# Patient Record
Sex: Male | Born: 1955 | Race: White | Hispanic: No | Marital: Single | Smoking: Former smoker
Health system: Southern US, Community
[De-identification: ages and names within clinical notes are randomized; demographics above are authoritative.]

## PROBLEM LIST (undated history)

## (undated) DIAGNOSIS — L03116 Cellulitis of left lower limb: Secondary | ICD-10-CM

## (undated) DIAGNOSIS — Z89519 Acquired absence of unspecified leg below knee: Secondary | ICD-10-CM

## (undated) DIAGNOSIS — K82A1 Gangrene of gallbladder in cholecystitis: Secondary | ICD-10-CM

## (undated) DIAGNOSIS — E785 Hyperlipidemia, unspecified: Secondary | ICD-10-CM

## (undated) DIAGNOSIS — Q2381 Bicuspid aortic valve: Secondary | ICD-10-CM

## (undated) DIAGNOSIS — L089 Local infection of the skin and subcutaneous tissue, unspecified: Secondary | ICD-10-CM

## (undated) DIAGNOSIS — A4101 Sepsis due to Methicillin susceptible Staphylococcus aureus: Secondary | ICD-10-CM

## (undated) DIAGNOSIS — L02415 Cutaneous abscess of right lower limb: Secondary | ICD-10-CM

## (undated) DIAGNOSIS — M86071 Acute hematogenous osteomyelitis, right ankle and foot: Secondary | ICD-10-CM

## (undated) DIAGNOSIS — R011 Cardiac murmur, unspecified: Secondary | ICD-10-CM

## (undated) DIAGNOSIS — I10 Essential (primary) hypertension: Secondary | ICD-10-CM

## (undated) DIAGNOSIS — Q231 Congenital insufficiency of aortic valve: Secondary | ICD-10-CM

## (undated) DIAGNOSIS — I739 Peripheral vascular disease, unspecified: Secondary | ICD-10-CM

## (undated) DIAGNOSIS — I219 Acute myocardial infarction, unspecified: Secondary | ICD-10-CM

## (undated) DIAGNOSIS — I330 Acute and subacute infective endocarditis: Secondary | ICD-10-CM

## (undated) DIAGNOSIS — I480 Paroxysmal atrial fibrillation: Secondary | ICD-10-CM

## (undated) DIAGNOSIS — I2510 Atherosclerotic heart disease of native coronary artery without angina pectoris: Secondary | ICD-10-CM

## (undated) DIAGNOSIS — K219 Gastro-esophageal reflux disease without esophagitis: Secondary | ICD-10-CM

## (undated) DIAGNOSIS — N189 Chronic kidney disease, unspecified: Secondary | ICD-10-CM

## (undated) DIAGNOSIS — E119 Type 2 diabetes mellitus without complications: Secondary | ICD-10-CM

## (undated) HISTORY — DX: Atherosclerotic heart disease of native coronary artery without angina pectoris: I25.10

## (undated) HISTORY — DX: Local infection of the skin and subcutaneous tissue, unspecified: L08.9

## (undated) HISTORY — DX: Cellulitis of left lower limb: L03.116

## (undated) HISTORY — DX: Chronic kidney disease, unspecified: N18.9

## (undated) HISTORY — DX: Congenital insufficiency of aortic valve: Q23.1

## (undated) HISTORY — DX: Acute myocardial infarction, unspecified: I21.9

## (undated) HISTORY — DX: Essential (primary) hypertension: I10

## (undated) HISTORY — PX: CARDIAC VALVE REPLACEMENT: SHX585

## (undated) HISTORY — DX: Acquired absence of unspecified leg below knee: Z89.519

## (undated) HISTORY — DX: Acute hematogenous osteomyelitis, right ankle and foot: M86.071

## (undated) HISTORY — DX: Gangrene of gallbladder in cholecystitis: K82.A1

## (undated) HISTORY — DX: Cutaneous abscess of right lower limb: L02.415

## (undated) HISTORY — DX: Bicuspid aortic valve: Q23.81

## (undated) HISTORY — DX: Peripheral vascular disease, unspecified: I73.9

## (undated) HISTORY — DX: Sepsis due to methicillin susceptible Staphylococcus aureus: A41.01

## (undated) HISTORY — PX: CATARACT EXTRACTION W/ INTRAOCULAR LENS  IMPLANT, BILATERAL: SHX1307

## (undated) HISTORY — DX: Hyperlipidemia, unspecified: E78.5

## (undated) HISTORY — PX: AMPUTATION TOE: SHX6595

## (undated) HISTORY — DX: Acute and subacute infective endocarditis: I33.0

---

## 2006-12-11 ENCOUNTER — Ambulatory Visit: Payer: Self-pay | Admitting: Internal Medicine

## 2006-12-11 ENCOUNTER — Encounter: Payer: Self-pay | Admitting: Internal Medicine

## 2008-03-20 ENCOUNTER — Emergency Department (HOSPITAL_COMMUNITY): Admission: EM | Admit: 2008-03-20 | Discharge: 2008-03-20 | Payer: Self-pay | Admitting: Emergency Medicine

## 2008-04-20 ENCOUNTER — Encounter: Payer: Self-pay | Admitting: Internal Medicine

## 2008-05-07 DIAGNOSIS — I219 Acute myocardial infarction, unspecified: Secondary | ICD-10-CM

## 2008-05-07 HISTORY — DX: Acute myocardial infarction, unspecified: I21.9

## 2008-05-07 HISTORY — PX: CARDIAC CATHETERIZATION: SHX172

## 2008-05-07 HISTORY — PX: CORONARY ARTERY BYPASS GRAFT: SHX141

## 2008-05-07 HISTORY — PX: AORTIC VALVE REPLACEMENT: SHX41

## 2008-05-18 ENCOUNTER — Encounter: Payer: Self-pay | Admitting: Internal Medicine

## 2008-06-07 ENCOUNTER — Encounter: Payer: Self-pay | Admitting: Internal Medicine

## 2008-07-05 ENCOUNTER — Encounter: Payer: Self-pay | Admitting: Internal Medicine

## 2008-08-05 ENCOUNTER — Encounter: Payer: Self-pay | Admitting: Internal Medicine

## 2008-08-19 ENCOUNTER — Ambulatory Visit: Payer: Self-pay | Admitting: Internal Medicine

## 2008-08-24 ENCOUNTER — Ambulatory Visit: Payer: Self-pay | Admitting: Internal Medicine

## 2008-09-03 ENCOUNTER — Ambulatory Visit: Payer: Self-pay | Admitting: Internal Medicine

## 2008-09-04 ENCOUNTER — Encounter: Payer: Self-pay | Admitting: Internal Medicine

## 2008-10-05 ENCOUNTER — Encounter: Payer: Self-pay | Admitting: Internal Medicine

## 2008-11-06 ENCOUNTER — Emergency Department (HOSPITAL_COMMUNITY): Admission: EM | Admit: 2008-11-06 | Discharge: 2008-11-06 | Payer: Self-pay | Admitting: Family Medicine

## 2008-11-06 ENCOUNTER — Inpatient Hospital Stay (HOSPITAL_COMMUNITY): Admission: EM | Admit: 2008-11-06 | Discharge: 2008-11-09 | Payer: Self-pay | Admitting: Emergency Medicine

## 2008-11-06 ENCOUNTER — Ambulatory Visit: Payer: Self-pay | Admitting: Internal Medicine

## 2008-12-08 ENCOUNTER — Inpatient Hospital Stay (HOSPITAL_COMMUNITY): Admission: EM | Admit: 2008-12-08 | Discharge: 2008-12-13 | Payer: Self-pay | Admitting: Emergency Medicine

## 2008-12-10 ENCOUNTER — Ambulatory Visit: Payer: Self-pay | Admitting: Internal Medicine

## 2009-02-04 DIAGNOSIS — I2510 Atherosclerotic heart disease of native coronary artery without angina pectoris: Secondary | ICD-10-CM

## 2009-02-04 HISTORY — DX: Atherosclerotic heart disease of native coronary artery without angina pectoris: I25.10

## 2009-02-28 ENCOUNTER — Inpatient Hospital Stay (HOSPITAL_COMMUNITY): Admission: EM | Admit: 2009-02-28 | Discharge: 2009-03-21 | Payer: Self-pay | Admitting: Emergency Medicine

## 2009-02-28 ENCOUNTER — Ambulatory Visit: Payer: Self-pay | Admitting: Cardiology

## 2009-03-01 ENCOUNTER — Encounter: Payer: Self-pay | Admitting: Cardiothoracic Surgery

## 2009-03-01 ENCOUNTER — Encounter: Payer: Self-pay | Admitting: Cardiology

## 2009-03-02 ENCOUNTER — Ambulatory Visit: Payer: Self-pay | Admitting: Cardiothoracic Surgery

## 2009-03-08 ENCOUNTER — Ambulatory Visit: Payer: Self-pay | Admitting: Dentistry

## 2009-03-08 ENCOUNTER — Encounter: Payer: Self-pay | Admitting: Cardiology

## 2009-03-14 ENCOUNTER — Encounter: Payer: Self-pay | Admitting: Cardiothoracic Surgery

## 2009-03-27 ENCOUNTER — Ambulatory Visit: Payer: Self-pay | Admitting: Internal Medicine

## 2009-03-27 ENCOUNTER — Encounter: Payer: Self-pay | Admitting: Cardiology

## 2009-03-27 ENCOUNTER — Inpatient Hospital Stay (HOSPITAL_COMMUNITY): Admission: EM | Admit: 2009-03-27 | Discharge: 2009-03-31 | Payer: Self-pay | Admitting: Emergency Medicine

## 2009-03-28 ENCOUNTER — Encounter: Payer: Self-pay | Admitting: Cardiology

## 2009-03-29 ENCOUNTER — Encounter: Payer: Self-pay | Admitting: Cardiology

## 2009-03-30 ENCOUNTER — Ambulatory Visit: Payer: Self-pay | Admitting: Cardiothoracic Surgery

## 2009-03-31 ENCOUNTER — Encounter: Payer: Self-pay | Admitting: Cardiology

## 2009-04-05 ENCOUNTER — Inpatient Hospital Stay (HOSPITAL_COMMUNITY): Admission: EM | Admit: 2009-04-05 | Discharge: 2009-05-10 | Payer: Self-pay | Admitting: Emergency Medicine

## 2009-04-05 ENCOUNTER — Ambulatory Visit: Payer: Self-pay | Admitting: Cardiology

## 2009-04-06 ENCOUNTER — Encounter: Payer: Self-pay | Admitting: Cardiology

## 2009-04-07 ENCOUNTER — Encounter: Payer: Self-pay | Admitting: Cardiology

## 2009-04-08 ENCOUNTER — Ambulatory Visit: Payer: Self-pay | Admitting: Infectious Diseases

## 2009-04-08 ENCOUNTER — Encounter: Payer: Self-pay | Admitting: Cardiology

## 2009-04-10 ENCOUNTER — Encounter: Payer: Self-pay | Admitting: Internal Medicine

## 2009-04-11 ENCOUNTER — Encounter: Payer: Self-pay | Admitting: Cardiology

## 2009-04-19 DIAGNOSIS — I739 Peripheral vascular disease, unspecified: Secondary | ICD-10-CM

## 2009-04-19 DIAGNOSIS — I10 Essential (primary) hypertension: Secondary | ICD-10-CM

## 2009-04-19 DIAGNOSIS — Z794 Long term (current) use of insulin: Secondary | ICD-10-CM | POA: Insufficient documentation

## 2009-04-19 DIAGNOSIS — M869 Osteomyelitis, unspecified: Secondary | ICD-10-CM | POA: Insufficient documentation

## 2009-04-19 DIAGNOSIS — E119 Type 2 diabetes mellitus without complications: Secondary | ICD-10-CM

## 2009-04-19 DIAGNOSIS — I2510 Atherosclerotic heart disease of native coronary artery without angina pectoris: Secondary | ICD-10-CM | POA: Insufficient documentation

## 2009-04-19 DIAGNOSIS — Q231 Congenital insufficiency of aortic valve: Secondary | ICD-10-CM

## 2009-04-19 DIAGNOSIS — E785 Hyperlipidemia, unspecified: Secondary | ICD-10-CM

## 2009-05-07 HISTORY — PX: AMPUTATION: SHX166

## 2009-05-09 ENCOUNTER — Encounter: Payer: Self-pay | Admitting: Cardiovascular Disease

## 2009-05-23 ENCOUNTER — Encounter: Admission: RE | Admit: 2009-05-23 | Discharge: 2009-05-23 | Payer: Self-pay | Admitting: Cardiothoracic Surgery

## 2009-05-23 ENCOUNTER — Encounter: Payer: Self-pay | Admitting: Cardiology

## 2009-05-23 ENCOUNTER — Ambulatory Visit: Payer: Self-pay | Admitting: Cardiothoracic Surgery

## 2009-05-25 ENCOUNTER — Encounter: Payer: Self-pay | Admitting: Physician Assistant

## 2009-05-25 ENCOUNTER — Ambulatory Visit: Payer: Self-pay | Admitting: Cardiology

## 2009-05-25 DIAGNOSIS — J90 Pleural effusion, not elsewhere classified: Secondary | ICD-10-CM | POA: Insufficient documentation

## 2009-05-25 DIAGNOSIS — S21109A Unspecified open wound of unspecified front wall of thorax without penetration into thoracic cavity, initial encounter: Secondary | ICD-10-CM | POA: Insufficient documentation

## 2009-05-25 DIAGNOSIS — I25810 Atherosclerosis of coronary artery bypass graft(s) without angina pectoris: Secondary | ICD-10-CM

## 2009-05-25 DIAGNOSIS — Z952 Presence of prosthetic heart valve: Secondary | ICD-10-CM | POA: Insufficient documentation

## 2009-05-31 ENCOUNTER — Telehealth: Payer: Self-pay | Admitting: Cardiology

## 2009-06-06 ENCOUNTER — Ambulatory Visit: Payer: Self-pay | Admitting: Cardiothoracic Surgery

## 2009-06-06 ENCOUNTER — Encounter: Admission: RE | Admit: 2009-06-06 | Discharge: 2009-06-06 | Payer: Self-pay | Admitting: Cardiothoracic Surgery

## 2009-06-15 ENCOUNTER — Ambulatory Visit: Payer: Self-pay | Admitting: Cardiothoracic Surgery

## 2009-06-15 ENCOUNTER — Encounter: Admission: RE | Admit: 2009-06-15 | Discharge: 2009-06-15 | Payer: Self-pay | Admitting: Cardiothoracic Surgery

## 2009-06-22 ENCOUNTER — Ambulatory Visit: Payer: Self-pay | Admitting: Cardiothoracic Surgery

## 2009-06-22 ENCOUNTER — Encounter: Admission: RE | Admit: 2009-06-22 | Discharge: 2009-06-22 | Payer: Self-pay | Admitting: Cardiothoracic Surgery

## 2009-06-29 ENCOUNTER — Ambulatory Visit (HOSPITAL_COMMUNITY): Admission: RE | Admit: 2009-06-29 | Discharge: 2009-06-29 | Payer: Self-pay | Admitting: Plastic Surgery

## 2009-07-04 ENCOUNTER — Ambulatory Visit: Payer: Self-pay | Admitting: Cardiology

## 2009-07-06 ENCOUNTER — Ambulatory Visit: Payer: Self-pay | Admitting: Cardiothoracic Surgery

## 2009-07-07 ENCOUNTER — Ambulatory Visit: Payer: Self-pay | Admitting: Cardiothoracic Surgery

## 2009-07-07 ENCOUNTER — Ambulatory Visit (HOSPITAL_COMMUNITY): Admission: RE | Admit: 2009-07-07 | Discharge: 2009-07-07 | Payer: Self-pay | Admitting: Cardiothoracic Surgery

## 2009-07-13 ENCOUNTER — Encounter: Admission: RE | Admit: 2009-07-13 | Discharge: 2009-07-13 | Payer: Self-pay | Admitting: Cardiothoracic Surgery

## 2009-07-13 ENCOUNTER — Ambulatory Visit: Payer: Self-pay | Admitting: Cardiothoracic Surgery

## 2009-07-15 ENCOUNTER — Encounter: Payer: Self-pay | Admitting: Cardiology

## 2009-08-03 ENCOUNTER — Ambulatory Visit: Payer: Self-pay | Admitting: Cardiothoracic Surgery

## 2009-08-03 ENCOUNTER — Encounter: Admission: RE | Admit: 2009-08-03 | Discharge: 2009-08-03 | Payer: Self-pay | Admitting: Cardiothoracic Surgery

## 2009-08-17 ENCOUNTER — Encounter: Payer: Self-pay | Admitting: Cardiology

## 2009-09-09 ENCOUNTER — Ambulatory Visit (HOSPITAL_BASED_OUTPATIENT_CLINIC_OR_DEPARTMENT_OTHER): Admission: RE | Admit: 2009-09-09 | Discharge: 2009-09-09 | Payer: Self-pay | Admitting: Podiatry

## 2009-09-16 ENCOUNTER — Encounter: Payer: Self-pay | Admitting: Cardiology

## 2009-10-05 ENCOUNTER — Encounter: Payer: Self-pay | Admitting: Cardiology

## 2009-10-11 ENCOUNTER — Ambulatory Visit: Payer: Self-pay | Admitting: Cardiology

## 2010-04-25 ENCOUNTER — Observation Stay (HOSPITAL_COMMUNITY)
Admission: RE | Admit: 2010-04-25 | Discharge: 2010-04-26 | Payer: Self-pay | Source: Home / Self Care | Attending: Orthopaedic Surgery | Admitting: Orthopaedic Surgery

## 2010-05-02 ENCOUNTER — Encounter (INDEPENDENT_AMBULATORY_CARE_PROVIDER_SITE_OTHER): Payer: Self-pay | Admitting: Orthopaedic Surgery

## 2010-05-02 ENCOUNTER — Observation Stay (HOSPITAL_COMMUNITY)
Admission: AD | Admit: 2010-05-02 | Discharge: 2010-05-03 | Payer: Self-pay | Source: Home / Self Care | Attending: Orthopaedic Surgery | Admitting: Orthopaedic Surgery

## 2010-05-28 ENCOUNTER — Encounter: Payer: Self-pay | Admitting: Cardiothoracic Surgery

## 2010-06-06 NOTE — Letter (Signed)
Summary: [facility]   [facility] By: Sallee Provencal 10/19/2009 16:28:20  _____________________________________________________________________  External Attachment:    Type:   Image     Comment:   External Document

## 2010-06-06 NOTE — Letter (Signed)
Summary: [facility]   [facility]   Imported By: Sallee Provencal 05/30/2009 11:25:07  _____________________________________________________________________  External Attachment:    Type:   Image     Comment:   External Document

## 2010-06-06 NOTE — Progress Notes (Signed)
Summary: PHARMACY NEED TO VERIFY MEDICATIONS  Phone Note From Pharmacy   Caller: Nelson [phone] Summary of Call: PHARMACY NEED TO VERIFY REFILLS. Initial call taken by: Delsa Sale,  May 31, 2009 9:50 AM  Follow-up for Phone Call        Spoke with pharmacy had ? about metoprolol and klor-con. Levora Angel, CNA  May 31, 2009 10:21 AM  Follow-up by: Levora Angel, CNA,  May 31, 2009 10:21 AM

## 2010-06-06 NOTE — Letter (Signed)
Summary: Return To Work  [facility] [place]  [address] [place]   [place], [place] [address]   Phone: [phone]  Fax: [phone]        07/04/2009    TO: Dorathy Daft IT MAY CONCERN     RE: [name] [id number]     The above named individual is under my medical care.  He may work but with the following restrictions:  No lefting more than 20 lbs, no climbing ladders and allow extra time for stairs.  If you have any further questions or need additional information, please call.       Sincerely,      Sim Boast, RN for  Dr Minus Breeding

## 2010-06-06 NOTE — Assessment & Plan Note (Signed)
Summary: per check out/sf   Visit Type:  Follow-up Primary Provider:  [name]  CC:  CAD.  History of Present Illness: The   patient returns for followup of his coronary disease. Since I last saw him he has had no new cardiovascular complaints. He continues to have work done on his left foot with diabetic joint disease. He seems to have recovered nicely from surgery. He denies any ongoing shortness of breath which was his predominant complaint prior. He has not had any chest pressure, neck or arm discomfort. He said no palpitations, presyncope or syncope. He says his hemoglobin A1c was 7.8. He said he had lipids drawn with an LDL less than 100 but I don't have these results. I do note that his weight has gone up 11 pounds since we started recording it.  Current Medications (verified): 1)  Lasix 40 Mg Tabs (Furosemide) .... Take One Twice Daily 2)  Lantus 100 Unit/ml Soln (Insulin Glargine) .... Take As Directed 3)  Metoprolol Tartrate 50 Mg Tabs (Metoprolol Tartrate) .... One Twice A Day 4)  Bayer Aspirin 325 Mg Tabs (Aspirin) .... Take One Daily 5)  Glipizide 5 Mg Xr24h-Tab (Glipizide) .... Take One Twice Daily 6)  Januvia 100 Mg Tabs (Sitagliptin Phosphate) .... Take One Daily 7)  Multivitamins  Caps (Multiple Vitamin) .... Take One Daily 8)  Nexium 40 Mg Pack (Esomeprazole Magnesium) .... Take One Daily 9)  Potassium Chloride 20 Meq Pack (Potassium Chloride) .... Take One Daily 10)  Simvastatin 20 Mg Tabs (Simvastatin) .... Take One Daily 11)  Tamsulosin Hcl 0.4 Mg Caps (Tamsulosin Hcl) .... Take One Daily 12)  Calcium 500 Mg Tabs (Calcium Carbonate) .... Take Two Daily 13)  Septra Ds 800-160 Mg Tabs (Sulfamethoxazole-Trimethoprim) .Marland Kitchen.. 1 By Mouth Two Times A Day  Allergies (verified): 1)  ! * Ace Inhibitors  Past History:  Past Medical History: Reviewed history from 07/04/2009 and no changes required.  Non-ST-segment-elevation myocardial infarction in October 2010.   Cardiac catheterization revealed severe triple-vessel coronary       artery disease.  He underwent coronary artery bypass grafting on       March 14, 2009. At that time, he had a LIMA graft to the LAD as       well as saphenous vein graft with to the diagonal and ramus       intermediate.  He had sequential saphenous vein graft to his RV       marginal branch as well as the posterior descending branch of the       right coronary artery.  Hyperlipidemia Hypertension PVD Diabetes mellitus  Past Surgical History: Reviewed history from 07/04/2009 and no changes required. CABG  Review of Systems       As stated in the HPI and negative for all other systems.   Vital Signs:  Patient profile:   55 year old male Height:      70 inches Weight:      221 pounds BMI:     31.82 Pulse rate:   94 / minute Resp:     16 per minute BP sitting:   136 / 70  (right arm)  Vitals Entered By: Levora Angel, CNA (October 11, 2009 9:17 AM)  Physical Exam  General:  Well developed, well nourished, in no acute distress. Head:  normocephalic and atraumatic Neck:  Neck supple, no JVD. No masses, thyromegaly or abnormal cervical nodes. Chest Wall:  Well-healed sternotomy scar and multiple other scars Lungs:  Clear bilaterally to auscultation and percussion. Abdomen:  Bowel sounds positive; abdomen soft and non-tender without masses, organomegaly, or hernias noted. No hepatosplenomegaly, obese Msk:  mild diffuse muscle wasting Neurologic:  Alert and oriented x 3. Skin:  Intact without lesions or rashes. Psych:  Normal affect.   Detailed Cardiovascular Exam  Neck    Carotids: Carotids full and equal bilaterally soft bruits.      Neck Veins: Normal, no JVD.    Heart    Inspection: no deformities or lifts noted.      Palpation: normal PMI with no thrills palpable.      Auscultation: regular rate and rhythm, S1, S2 ,systolic murmur radiating up the aortic outflow tract and early peaking, no  diastolic murmurs  Vascular    Abdominal Aorta: no palpable masses, pulsations, or audible bruits.      Femoral Pulses: normal femoral pulses bilaterally.      Pedal Pulses: absent right dorsalis pedis pulse, absent right posterior tibial pulse, absent left dorsalis pedis pulse, left foot in a soft cast, mild edema of the right foot, chronic venous stasis changes    Radial Pulses: normal radial pulses bilaterally.      Peripheral Circulation: no clubbing, cyanosis,   EKG  Procedure date:  10/11/2009  Findings:      sinus rhythm, rate 94, left axis deviation, left ventricular hypertrophy, poor anterior R-wave progression  Impression & Recommendations:  Problem # 1:  CAD, AUTOLOGOUS BYPASS GRAFT (ICD-414.02) He is doing well and will continue with risk reduction. I did discuss with him the need for weight loss and exercise  Problem # 2:  HYPERLIPIDEMIA (ICD-272.4) I would be happy to review his lipids. I would like his LDL be less than 70 and HDL greater than 40 and wouldn't pursue this for medical therapy, diet and exercise.  Problem # 3:  HYPERTENSION (ICD-401.9) His blood pressure is controlled. He will continue the meds as listed.  Other Orders: EKG w/ Interpretation (93000)  Patient Instructions: 1)  Your physician recommends that you schedule a follow-up appointment in: 12 months 2)  Your physician recommends that you continue on your current medications as directed. Please refer to the Current Medication list given to you today.

## 2010-06-06 NOTE — Letter (Signed)
Summary: [facility]   Imported By: Jamelle Haring 07/28/2009 13:28:18  _____________________________________________________________________  External Attachment:    Type:   Image     Comment:   External Document

## 2010-06-06 NOTE — Assessment & Plan Note (Signed)
Summary: eph   Visit Type:  Follow-up  CC:  no complaints.  History of Present Illness: This is a 55 year old male patient, who was hospitalized from April 05, 2009 until May 10, 2009. He had undergone CABG x5 and aortic valve replacement March 14, 2009. He was readmitted with recurrent bilateral pleural effusions, sternal wound infection with sternal debridement and bilateral pectoralis muscle flaps on April 20, 2009. He also underwent right thoracentesis December 2nd and 17th and left the thoracentesis December 1. He also has a history of osteomyelitis of his right foot first and fifth ray had an orthopedic consult. Blood cultures were positive for rare staph. Aureus. He was treated with antibiotics and eventually discharged home.  The patient has been followed by home health closely. He finished his antibiotics Monday. He saw the PA at Dr. Lucianne Lei Trigt's office Monday. They did decrease his pleural drain on the left to twice weekly, down from 3 times weekly. His Lasix had been increased shortly after he got out of a hospital. He saw Dr. Denese Killings yesterday, who cleaned his sternal wound and pulled a J-tube.  Overall the patient remains weak. He denies chest pain, palpitations, dyspnea, dyspnea on exertion, orthopnea, paroxysmal nocturnal dyspnea, dizziness, or presyncope. His pulse rate is a little high today, but he says that generally runs high even presurgery. He is asymptomatic with this. He is afebrile today.  Current Medications (verified): 1)  Nu-Iron 150 Mg Caps (Polysaccharide Iron Complex) .... Take One Daily 2)  Lasix 40 Mg Tabs (Furosemide) .... Take One Twice Daily 3)  Lantus 100 Unit/ml Soln (Insulin Glargine) .... Take As Directed 4)  Lopressor 50 Mg Tabs (Metoprolol Tartrate) .... Take 1/2 Tablet 3 Times Daily 5)  Bayer Aspirin 325 Mg Tabs (Aspirin) .... Take One Daily 6)  Folic Acid 1 Mg Tabs (Folic Acid) .... Take One Daily 7)  Glipizide 5 Mg Xr24h-Tab (Glipizide) ....  Take One Twice Daily 8)  Januvia 100 Mg Tabs (Sitagliptin Phosphate) .... Take One Daily 9)  Multivitamins  Caps (Multiple Vitamin) .... Take One Daily 10)  Nexium 40 Mg Pack (Esomeprazole Magnesium) .... Take One Daily 11)  Potassium Chloride 20 Meq Pack (Potassium Chloride) .... Take One Daily 12)  Simvastatin 20 Mg Tabs (Simvastatin) .... Take One Daily 13)  Tamsulosin Hcl 0.4 Mg Caps (Tamsulosin Hcl) .... Take One Daily 14)  Calcium 500 Mg Tabs (Calcium Carbonate) .... Take Two Daily  Allergies: 1)  ! * Ace Inhibitors  Past History:  Past Medical History: Last updated: 04/19/2009 CAD Hyperlipidemia Hypertension PVD  diabetes mellitus  Review of Systems       See history of present illness  Vital Signs:  Patient profile:   55 year old male Height:      70 inches Weight:      210 pounds BMI:     30.24 Temp:     97.9 degrees F Pulse rate:   104 / minute Pulse rhythm:   regular BP sitting:   122 / 84  (left arm)  Vitals Entered By: Talbert Nan, CMA (May 25, 2009 8:24 AM)  Physical Exam  General:  Pale, chronically ill looking, in no acute distress. Neck: No JVD, HJR, Bruit, or thyroid enlargement Lungs: Decreased breath sounds at the right base with a few crackles. Decreased breath sounds at the left base, but clearer than the right with scattered wheezes Cardiovascular:sternal wound is dressed. I did not remove it as it was clean by Dr. Denese Killings yesterday. RRR, PMI  not displaced, heart sounds normal, no murmurs, gallops, bruit, thrill, or heave. Abdomen: BS normal. Soft without organomegaly, masses, lesions or tenderness. Extremities: PICC line remains in. To be pulled today by home health. Lower extremities have bilateral brawny edema with scaling changes, right greater than left. Decreased distal pulses. SKin: Warm, brawny changes as described above in the lower extremities  Musculoskeletal: No deformities Neuro: no focal signs    EKG  Procedure date:   05/25/2009  Findings:      normal sinus rhythm at 96 beats per minute, normal EKG  Impression & Recommendations:  Problem # 1:  OPEN WOUND OF CHEST , COMPLICATED (123456.1) Patient had a very complicated hospitalization with methicillin susceptible staph, RES bacteremia, and wound culture of the sternal wound. He underwent sternal reexploration debridement, pulse lavaged, and VAC placement by Dr. Gorden Harms in December 7. He had back change and stromal debridement December 9 and then sternal debridement with bilateral pectoralis muscle flaps. December 15. He had his wound dressing changed yesterday by Dr. Denese Killings [place] tube peg. Antibiotics are finished. He remains afebrile. He seems to be recovering slowly from that setback.  Problem # 2:  PLEURAL EFFUSION (ICD-511.9) Patient continues to have some bilateral pleural effusions. His Pleurx catheter on the left has been decreased to drain twice weekly. He continues to have quite a bit of edema in his lower extremities. I will increase his Lasix to 80 mg in the morning 40 in the evening for 5 days, as well as adjust his potassium. Patient just had blood work drawn by home health earlier this week, so I will not repeat this.  Problem # 3:  OSTEOMYELITIS (ICD-730.20) Patient had his first and fifth right toes amputated back in October so far. This has been stable.  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 This seems to be doing well. His updated medication list for this problem includes:    Lopressor 50 Mg Tabs (Metoprolol tartrate) .Marland Kitchen... Take 1/2 tablet 3 times daily    Bayer Aspirin 325 Mg Tabs (Aspirin) .Marland Kitchen... Take one daily  Patient Instructions: 1)  Increase Lasix 40mg  take 2 in am 1 in pm for 5 days then back to 1 tablet twice daily. 2)  Increase Potassium 72meq 2 tablets in am 1 in pm for 5 days then back to 1 tablet twice daily. 3)  Increase Lopressor  50mg  1 whole tablet twice daily. 4)  Dr. Percival Spanish 1 month 5)  Call if you have further problems before your scheduled appointment.

## 2010-06-06 NOTE — Assessment & Plan Note (Signed)
Summary: per check out/sf   Visit Type:  Follow-up Primary Provider:  [name]  CC:  CAD/AVR.  History of Present Illness: The patient presents for followup after bypass and aortic valve replacement and a complicated readmission for sternal wound infection. He still has a Pleurx catheter for pleural effusion in his left side. He has recently seen Dr. Prescott Gum and had a chest x-ray and will continue followup. He is also seen the plastic surgeon for treatment of his sternal wound. He has returned to work today. He says he is weak in the legs but is not having any of the chest discomfort or shortness of breath that he had previously. He has had no PND or orthopnea. He has had no chest pressure, neck or arm discomfort. He has had no palpitations, presyncope or syncope. He has had no fevers chills or cough. He has had no weight gain or swelling.  Current Medications (verified): 1)  Lasix 40 Mg Tabs (Furosemide) .... Take One Twice Daily 2)  Lantus 100 Unit/ml Soln (Insulin Glargine) .... Take As Directed 3)  Metoprolol Tartrate 25 Mg Tabs (Metoprolol Tartrate) .Marland Kitchen.. 1 By Mouth Two Times A Day 4)  Bayer Aspirin 325 Mg Tabs (Aspirin) .... Take One Daily 5)  Glipizide 5 Mg Xr24h-Tab (Glipizide) .... Take One Twice Daily 6)  Januvia 100 Mg Tabs (Sitagliptin Phosphate) .... Take One Daily 7)  Multivitamins  Caps (Multiple Vitamin) .... Take One Daily 8)  Nexium 40 Mg Pack (Esomeprazole Magnesium) .... Take One Daily 9)  Potassium Chloride 20 Meq Pack (Potassium Chloride) .... Take One Daily 10)  Simvastatin 20 Mg Tabs (Simvastatin) .... Take One Daily 11)  Tamsulosin Hcl 0.4 Mg Caps (Tamsulosin Hcl) .... Take One Daily 12)  Calcium 500 Mg Tabs (Calcium Carbonate) .... Take Two Daily 13)  Septra Ds 800-160 Mg Tabs (Sulfamethoxazole-Trimethoprim) .Marland Kitchen.. 1 By Mouth Two Times A Day  Allergies (verified): 1)  ! * Ace Inhibitors  Past History:  Past Medical History:  Non-ST-segment-elevation  myocardial infarction in October 2010.       Cardiac catheterization revealed severe triple-vessel coronary       artery disease.  He underwent coronary artery bypass grafting on       March 14, 2009. At that time, he had a LIMA graft to the LAD as       well as saphenous vein graft with to the diagonal and ramus       intermediate.  He had sequential saphenous vein graft to his RV       marginal branch as well as the posterior descending branch of the       right coronary artery.  Hyperlipidemia Hypertension PVD Diabetes mellitus  Past Surgical History: CABG  Review of Systems       As stated in the HPI and negative for all other systems.  ory: CABG  Family History:  Positive for diabetes.  Positive for coronary disease   in his father with an MI at early age.  A sibling had an MI at age 51.      Social History: The patient lives alone in [place] and works for the   [employer] in the [place].  He stopped smoking 10   years ago.  He rarely consumes alcohol.  He is on a diabetic diet with   regular exercise at work, but little outside of his work.     [ROS-[id number]]  Vital Signs:  Patient profile:  55 year old male Height:      70 inches Weight:      215 pounds BMI:     30.96 Pulse rate:   98 / minute Resp:     18 per minute BP sitting:   140 / 78  (right arm)  Vitals Entered By: Levora Angel, CNA (July 04, 2009 9:40 AM)  Physical Exam  General:  Well developed, well nourished, in no acute distress. Head:  normocephalic and atraumatic Eyes:  PERRLA/EOM intact; conjunctiva and lids normal. Mouth:  Poor dentition, spacegums and palate normal. Oral mucosa normal. Neck:  Neck supple, no JVD. No masses, thyromegaly or abnormal cervical nodes. Chest Wall:  Well-healed sternotomy scar with Pleurx catheter in place Lungs:  Clear bilaterally to auscultation and percussion. Abdomen:  Bowel sounds positive; abdomen soft and non-tender without  masses, organomegaly, or hernias noted. No hepatosplenomegaly. Msk:  Back normal, normal gait. Muscle strength and tone normal. Extremities:  status post right toe amputations Neurologic:  Alert and oriented x 3. Skin:  Intact without lesions or rashes. Cervical Nodes:  no significant adenopathy Axillary Nodes:  no significant adenopathy Inguinal Nodes:  no significant adenopathy Psych:  Normal affect.   Detailed Cardiovascular Exam  Neck    Carotids: Carotids full and equal bilaterally soft bruits.      Neck Veins: Normal, no JVD.    Heart    Inspection: no deformities or lifts noted.      Palpation: normal PMI with no thrills palpable.      Auscultation: regular rate and rhythm, S1, S2 without murmurs, rubs, gallops, or clicks.    Vascular    Abdominal Aorta: no palpable masses, pulsations, or audible bruits.      Femoral Pulses: normal femoral pulses bilaterally.      Pedal Pulses: absent right dorsalis pedis pulse, absent right posterior tibial pulse, absent left dorsalis pedis pulse, and absent left posterior tibial pulse.  absent right dorsalis pedis pulse, absent right posterior tibial pulse, absent left dorsalis pedis pulse, and absent left posterior tibial pulse.      Radial Pulses: normal radial pulses bilaterally.      Peripheral Circulation: no clubbing, cyanosis, or edema noted with normal capillary refill.     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  I will refer him to cardiac [facility] at [facility]. Orders: Cardiac Rehabilitation (Cardiac Rehab)  His updated medication list for this problem includes:    Metoprolol Tartrate 50 Mg Tabs (Metoprolol tartrate) ..... One twice a day    Bayer Aspirin 325 Mg Tabs (Aspirin) .Marland Kitchen... 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 I discussed his poor  dentition and the need for very good dental hygiene and suggested he see a dentist.  Problem # 3:  HYPERTENSION (ICD-401.9) His blood pressure is slightly elevated. I will increase his metoprolol to 50 mg b.i.d.  Problem # 4:  BICUSPID AORTIC VALVE (ICD-746.4) He is status post valve replacement. We discussed endocarditis prophylaxis as above.  Problem # 5:  Work I gave him a note to return to work with restrictions.  Patient Instructions: 1)  Your physician recommends that you schedule a follow-up appointment in:   3 months with Dr Percival Spanish 2)  Your physician deems you medically cleared to return to work.  A return to work note was provided today, however there is  to be no lifting over 20 lbs, no clinbing ladders and allow extra time for stairs. 3)  Your physician has recommended you make the following change in your medication: Increase Metoprolol to 50 mg twice day Prescriptions: METOPROLOL TARTRATE 50 MG TABS (METOPROLOL TARTRATE) one twice a day  #60 x 11   Entered by:   Sim Boast, RN   Authorized by:   Minus Breeding, MD, [facility]   Signed by:   Sim Boast, RN on 07/04/2009   Method used:   Electronically to        [facility]. [facility] (retail)       [address]       [place], [place]  [address]       Ph: [phone]       Fax: [phone]   RxID:   [id number]

## 2010-06-06 NOTE — Letter (Signed)
Summary: Return To Work  [facility] [place]  [address] [place]   [place], [place] [address]   Phone: [phone]  Fax: [phone]        07/04/2009      TO: WHOM IT MAY CONCERN     RE: [name] [id number]   The above named individual is under my medical care and may work with the following restrictions:  No lifting over 20 lbs, no climbing ladders and allow extra time for stairs.  If you have any further questions or need additional information, please call.       Sincerely,      Sim Boast, RN for  Dr Minus Breeding

## 2010-06-06 NOTE — Letter (Signed)
Summary: [facility]   Imported By: Zenovia Jarred 05/09/2009 16:28:01  _____________________________________________________________________  External Attachment:    Type:   Image     Comment:   External Document

## 2010-07-17 LAB — CBC
HCT: 32.9 % — ABNORMAL LOW (ref 39.0–52.0)
MCV: 85.7 fL (ref 78.0–100.0)
Platelets: 142 10*3/uL — ABNORMAL LOW (ref 150–400)
Platelets: 200 10*3/uL (ref 150–400)
RBC: 3.84 MIL/uL — ABNORMAL LOW (ref 4.22–5.81)
RDW: 13.4 % (ref 11.5–15.5)
RDW: 13.8 % (ref 11.5–15.5)
WBC: 11.9 10*3/uL — ABNORMAL HIGH (ref 4.0–10.5)
WBC: 13.2 10*3/uL — ABNORMAL HIGH (ref 4.0–10.5)

## 2010-07-17 LAB — GLUCOSE, CAPILLARY
Glucose-Capillary: 139 mg/dL — ABNORMAL HIGH (ref 70–99)
Glucose-Capillary: 186 mg/dL — ABNORMAL HIGH (ref 70–99)
Glucose-Capillary: 253 mg/dL — ABNORMAL HIGH (ref 70–99)
Glucose-Capillary: 272 mg/dL — ABNORMAL HIGH (ref 70–99)
Glucose-Capillary: 343 mg/dL — ABNORMAL HIGH (ref 70–99)
Glucose-Capillary: 71 mg/dL (ref 70–99)
Glucose-Capillary: 73 mg/dL (ref 70–99)
Glucose-Capillary: 94 mg/dL (ref 70–99)
Glucose-Capillary: 97 mg/dL (ref 70–99)

## 2010-07-17 LAB — PROTIME-INR
INR: 1.14 (ref 0.00–1.49)
Prothrombin Time: 14.8 seconds (ref 11.6–15.2)

## 2010-07-17 LAB — ANAEROBIC CULTURE

## 2010-07-17 LAB — BASIC METABOLIC PANEL
BUN: 35 mg/dL — ABNORMAL HIGH (ref 6–23)
CO2: 28 mEq/L (ref 19–32)
Chloride: 104 mEq/L (ref 96–112)
Creatinine, Ser: 1.17 mg/dL (ref 0.4–1.5)
Creatinine, Ser: 1.24 mg/dL (ref 0.4–1.5)
GFR calc Af Amer: >60 mL/min (ref >60)
GFR calc non Af Amer: >60 mL/min (ref >60)
Potassium: 4.6 mEq/L (ref 3.5–5.1)
Sodium: 137 mEq/L (ref 135–145)

## 2010-07-17 LAB — DIFFERENTIAL
Basophils Absolute: 0 10*3/uL (ref 0.0–0.1)
Lymphocytes Relative: 10 % — ABNORMAL LOW (ref 12–46)
Neutro Abs: 9.3 10*3/uL — ABNORMAL HIGH (ref 1.7–7.7)

## 2010-07-17 LAB — APTT: aPTT: 30 seconds (ref 24–37)

## 2010-07-17 LAB — TISSUE CULTURE

## 2010-07-17 LAB — SURGICAL PCR SCREEN: Staphylococcus aureus: NEGATIVE

## 2010-07-23 LAB — GLUCOSE, CAPILLARY
Glucose-Capillary: 108 mg/dL — ABNORMAL HIGH (ref 70–99)
Glucose-Capillary: 132 mg/dL — ABNORMAL HIGH (ref 70–99)
Glucose-Capillary: 150 mg/dL — ABNORMAL HIGH (ref 70–99)
Glucose-Capillary: 155 mg/dL — ABNORMAL HIGH (ref 70–99)
Glucose-Capillary: 203 mg/dL — ABNORMAL HIGH (ref 70–99)
Glucose-Capillary: 212 mg/dL — ABNORMAL HIGH (ref 70–99)
Glucose-Capillary: 266 mg/dL — ABNORMAL HIGH (ref 70–99)
Glucose-Capillary: 79 mg/dL (ref 70–99)

## 2010-07-23 LAB — BASIC METABOLIC PANEL
Chloride: 99 mEq/L (ref 96–112)
GFR calc Af Amer: >60 mL/min (ref >60)
Potassium: 3.7 mEq/L (ref 3.5–5.1)

## 2010-07-23 LAB — PREALBUMIN: Prealbumin: 16.6 mg/dL — ABNORMAL LOW (ref 18.0–45.0)

## 2010-07-25 LAB — POCT I-STAT, CHEM 8
Calcium, Ion: 1.23 mmol/L (ref 1.12–1.32)
Chloride: 111 mEq/L (ref 96–112)
Creatinine, Ser: 1.4 mg/dL (ref 0.4–1.5)
Glucose, Bld: 180 mg/dL — ABNORMAL HIGH (ref 70–99)
HCT: 33 % — ABNORMAL LOW (ref 39.0–52.0)

## 2010-07-26 LAB — CBC
HCT: 30.2 % — ABNORMAL LOW (ref 39.0–52.0)
Hemoglobin: 10.2 g/dL — ABNORMAL LOW (ref 13.0–17.0)
MCHC: 33.7 g/dL (ref 30.0–36.0)
MCV: 80.8 fL (ref 78.0–100.0)
RBC: 3.74 MIL/uL — ABNORMAL LOW (ref 4.22–5.81)
RDW: 15.1 % (ref 11.5–15.5)

## 2010-07-26 LAB — BASIC METABOLIC PANEL
CO2: 27 mEq/L (ref 19–32)
Calcium: 8.9 mg/dL (ref 8.4–10.5)
Chloride: 105 mEq/L (ref 96–112)
Glucose, Bld: 163 mg/dL — ABNORMAL HIGH (ref 70–99)
Potassium: 4.4 mEq/L (ref 3.5–5.1)
Sodium: 137 mEq/L (ref 135–145)

## 2010-07-31 LAB — BASIC METABOLIC PANEL
BUN: 27 mg/dL — ABNORMAL HIGH (ref 6–23)
CO2: 25 mEq/L (ref 19–32)
Calcium: 8.8 mg/dL (ref 8.4–10.5)
Chloride: 106 mEq/L (ref 96–112)
Creatinine, Ser: 1.58 mg/dL — ABNORMAL HIGH (ref 0.4–1.5)
GFR calc Af Amer: 56 mL/min — ABNORMAL LOW (ref >60)
GFR calc non Af Amer: 46 mL/min — ABNORMAL LOW (ref >60)
Glucose, Bld: 122 mg/dL — ABNORMAL HIGH (ref 70–99)
Potassium: 4.9 mEq/L (ref 3.5–5.1)
Sodium: 134 mEq/L — ABNORMAL LOW (ref 135–145)

## 2010-07-31 LAB — CBC
HCT: 30.9 % — ABNORMAL LOW (ref 39.0–52.0)
Hemoglobin: 10.5 g/dL — ABNORMAL LOW (ref 13.0–17.0)
MCHC: 33.9 g/dL (ref 30.0–36.0)
MCV: 81 fL (ref 78.0–100.0)
Platelets: 136 10*3/uL — ABNORMAL LOW (ref 150–400)
RBC: 3.82 MIL/uL — ABNORMAL LOW (ref 4.22–5.81)
RDW: 15.5 % (ref 11.5–15.5)
WBC: 9.7 10*3/uL (ref 4.0–10.5)

## 2010-07-31 LAB — GLUCOSE, CAPILLARY: Glucose-Capillary: 104 mg/dL — ABNORMAL HIGH (ref 70–99)

## 2010-08-07 LAB — CBC
HCT: 24.8 % — ABNORMAL LOW (ref 39.0–52.0)
HCT: 25.2 % — ABNORMAL LOW (ref 39.0–52.0)
HCT: 26.1 % — ABNORMAL LOW (ref 39.0–52.0)
HCT: 27.3 % — ABNORMAL LOW (ref 39.0–52.0)
HCT: 27.8 % — ABNORMAL LOW (ref 39.0–52.0)
HCT: 29.6 % — ABNORMAL LOW (ref 39.0–52.0)
HCT: 29.9 % — ABNORMAL LOW (ref 39.0–52.0)
Hemoglobin: 10 g/dL — ABNORMAL LOW (ref 13.0–17.0)
Hemoglobin: 8.2 g/dL — ABNORMAL LOW (ref 13.0–17.0)
Hemoglobin: 8.2 g/dL — ABNORMAL LOW (ref 13.0–17.0)
Hemoglobin: 8.5 g/dL — ABNORMAL LOW (ref 13.0–17.0)
Hemoglobin: 8.5 g/dL — ABNORMAL LOW (ref 13.0–17.0)
Hemoglobin: 9.2 g/dL — ABNORMAL LOW (ref 13.0–17.0)
Hemoglobin: 9.2 g/dL — ABNORMAL LOW (ref 13.0–17.0)
Hemoglobin: 9.9 g/dL — ABNORMAL LOW (ref 13.0–17.0)
MCHC: 32.5 g/dL (ref 30.0–36.0)
MCHC: 32.5 g/dL (ref 30.0–36.0)
MCHC: 33.1 g/dL (ref 30.0–36.0)
MCHC: 33.1 g/dL (ref 30.0–36.0)
MCHC: 33.4 g/dL (ref 30.0–36.0)
MCHC: 33.5 g/dL (ref 30.0–36.0)
MCHC: 33.6 g/dL (ref 30.0–36.0)
MCV: 85.7 fL (ref 78.0–100.0)
MCV: 86.3 fL (ref 78.0–100.0)
MCV: 86.6 fL (ref 78.0–100.0)
MCV: 86.8 fL (ref 78.0–100.0)
MCV: 87 fL (ref 78.0–100.0)
Platelets: 134 10*3/uL — ABNORMAL LOW (ref 150–400)
Platelets: 136 10*3/uL — ABNORMAL LOW (ref 150–400)
Platelets: 156 10*3/uL (ref 150–400)
Platelets: 161 10*3/uL (ref 150–400)
Platelets: 179 10*3/uL (ref 150–400)
Platelets: 189 10*3/uL (ref 150–400)
RBC: 2.9 MIL/uL — ABNORMAL LOW (ref 4.22–5.81)
RBC: 2.9 MIL/uL — ABNORMAL LOW (ref 4.22–5.81)
RBC: 3.01 MIL/uL — ABNORMAL LOW (ref 4.22–5.81)
RBC: 3.17 MIL/uL — ABNORMAL LOW (ref 4.22–5.81)
RBC: 3.41 MIL/uL — ABNORMAL LOW (ref 4.22–5.81)
RBC: 3.44 MIL/uL — ABNORMAL LOW (ref 4.22–5.81)
RDW: 15.3 % (ref 11.5–15.5)
RDW: 16.1 % — ABNORMAL HIGH (ref 11.5–15.5)
RDW: 16.2 % — ABNORMAL HIGH (ref 11.5–15.5)
RDW: 16.2 % — ABNORMAL HIGH (ref 11.5–15.5)
RDW: 16.4 % — ABNORMAL HIGH (ref 11.5–15.5)
RDW: 16.4 % — ABNORMAL HIGH (ref 11.5–15.5)
RDW: 16.5 % — ABNORMAL HIGH (ref 11.5–15.5)
RDW: 16.5 % — ABNORMAL HIGH (ref 11.5–15.5)
WBC: 16.3 10*3/uL — ABNORMAL HIGH (ref 4.0–10.5)
WBC: 17 10*3/uL — ABNORMAL HIGH (ref 4.0–10.5)
WBC: 18.9 10*3/uL — ABNORMAL HIGH (ref 4.0–10.5)
WBC: 7.9 10*3/uL (ref 4.0–10.5)
WBC: 8.1 10*3/uL (ref 4.0–10.5)
WBC: 8.8 10*3/uL (ref 4.0–10.5)

## 2010-08-07 LAB — BASIC METABOLIC PANEL
BUN: 20 mg/dL (ref 6–23)
BUN: 21 mg/dL (ref 6–23)
BUN: 22 mg/dL (ref 6–23)
BUN: 23 mg/dL (ref 6–23)
BUN: 24 mg/dL — ABNORMAL HIGH (ref 6–23)
CO2: 22 mEq/L (ref 19–32)
CO2: 26 mEq/L (ref 19–32)
CO2: 27 mEq/L (ref 19–32)
CO2: 28 mEq/L (ref 19–32)
CO2: 30 mEq/L (ref 19–32)
CO2: 31 mEq/L (ref 19–32)
CO2: 31 mEq/L (ref 19–32)
Calcium: 7.4 mg/dL — ABNORMAL LOW (ref 8.4–10.5)
Calcium: 7.7 mg/dL — ABNORMAL LOW (ref 8.4–10.5)
Calcium: 7.9 mg/dL — ABNORMAL LOW (ref 8.4–10.5)
Calcium: 7.9 mg/dL — ABNORMAL LOW (ref 8.4–10.5)
Calcium: 8.1 mg/dL — ABNORMAL LOW (ref 8.4–10.5)
Calcium: 8.4 mg/dL (ref 8.4–10.5)
Calcium: 8.4 mg/dL (ref 8.4–10.5)
Chloride: 100 mEq/L (ref 96–112)
Chloride: 105 mEq/L (ref 96–112)
Chloride: 105 mEq/L (ref 96–112)
Chloride: 106 mEq/L (ref 96–112)
Chloride: 108 mEq/L (ref 96–112)
Chloride: 109 mEq/L (ref 96–112)
Chloride: 110 mEq/L (ref 96–112)
Creatinine, Ser: 1.23 mg/dL (ref 0.4–1.5)
Creatinine, Ser: 1.29 mg/dL (ref 0.4–1.5)
Creatinine, Ser: 1.32 mg/dL (ref 0.4–1.5)
Creatinine, Ser: 1.35 mg/dL (ref 0.4–1.5)
Creatinine, Ser: 1.35 mg/dL (ref 0.4–1.5)
Creatinine, Ser: 1.38 mg/dL (ref 0.4–1.5)
GFR calc Af Amer: >60 mL/min (ref >60)
GFR calc Af Amer: >60 mL/min (ref >60)
GFR calc Af Amer: >60 mL/min (ref >60)
GFR calc Af Amer: >60 mL/min (ref >60)
GFR calc Af Amer: >60 mL/min (ref >60)
GFR calc Af Amer: >60 mL/min (ref >60)
GFR calc Af Amer: >60 mL/min (ref >60)
GFR calc non Af Amer: 54 mL/min — ABNORMAL LOW (ref >60)
GFR calc non Af Amer: 56 mL/min — ABNORMAL LOW (ref >60)
GFR calc non Af Amer: 57 mL/min — ABNORMAL LOW (ref >60)
GFR calc non Af Amer: 58 mL/min — ABNORMAL LOW (ref >60)
GFR calc non Af Amer: 58 mL/min — ABNORMAL LOW (ref >60)
GFR calc non Af Amer: >60 mL/min (ref >60)
Glucose, Bld: 101 mg/dL — ABNORMAL HIGH (ref 70–99)
Glucose, Bld: 119 mg/dL — ABNORMAL HIGH (ref 70–99)
Glucose, Bld: 127 mg/dL — ABNORMAL HIGH (ref 70–99)
Glucose, Bld: 127 mg/dL — ABNORMAL HIGH (ref 70–99)
Glucose, Bld: 130 mg/dL — ABNORMAL HIGH (ref 70–99)
Glucose, Bld: 140 mg/dL — ABNORMAL HIGH (ref 70–99)
Glucose, Bld: 144 mg/dL — ABNORMAL HIGH (ref 70–99)
Glucose, Bld: 146 mg/dL — ABNORMAL HIGH (ref 70–99)
Glucose, Bld: 67 mg/dL — ABNORMAL LOW (ref 70–99)
Potassium: 3.7 mEq/L (ref 3.5–5.1)
Potassium: 3.8 mEq/L (ref 3.5–5.1)
Potassium: 3.9 mEq/L (ref 3.5–5.1)
Potassium: 4 mEq/L (ref 3.5–5.1)
Potassium: 4.1 mEq/L (ref 3.5–5.1)
Potassium: 4.2 mEq/L (ref 3.5–5.1)
Potassium: 4.3 mEq/L (ref 3.5–5.1)
Potassium: 4.6 mEq/L (ref 3.5–5.1)
Sodium: 135 mEq/L (ref 135–145)
Sodium: 135 mEq/L (ref 135–145)
Sodium: 136 mEq/L (ref 135–145)
Sodium: 138 mEq/L (ref 135–145)
Sodium: 138 mEq/L (ref 135–145)
Sodium: 139 mEq/L (ref 135–145)
Sodium: 139 mEq/L (ref 135–145)
Sodium: 140 mEq/L (ref 135–145)
Sodium: 140 mEq/L (ref 135–145)

## 2010-08-07 LAB — GLUCOSE, CAPILLARY
Glucose-Capillary: 101 mg/dL — ABNORMAL HIGH (ref 70–99)
Glucose-Capillary: 104 mg/dL — ABNORMAL HIGH (ref 70–99)
Glucose-Capillary: 105 mg/dL — ABNORMAL HIGH (ref 70–99)
Glucose-Capillary: 110 mg/dL — ABNORMAL HIGH (ref 70–99)
Glucose-Capillary: 116 mg/dL — ABNORMAL HIGH (ref 70–99)
Glucose-Capillary: 117 mg/dL — ABNORMAL HIGH (ref 70–99)
Glucose-Capillary: 121 mg/dL — ABNORMAL HIGH (ref 70–99)
Glucose-Capillary: 125 mg/dL — ABNORMAL HIGH (ref 70–99)
Glucose-Capillary: 136 mg/dL — ABNORMAL HIGH (ref 70–99)
Glucose-Capillary: 149 mg/dL — ABNORMAL HIGH (ref 70–99)
Glucose-Capillary: 150 mg/dL — ABNORMAL HIGH (ref 70–99)
Glucose-Capillary: 159 mg/dL — ABNORMAL HIGH (ref 70–99)
Glucose-Capillary: 161 mg/dL — ABNORMAL HIGH (ref 70–99)
Glucose-Capillary: 169 mg/dL — ABNORMAL HIGH (ref 70–99)
Glucose-Capillary: 169 mg/dL — ABNORMAL HIGH (ref 70–99)
Glucose-Capillary: 171 mg/dL — ABNORMAL HIGH (ref 70–99)
Glucose-Capillary: 175 mg/dL — ABNORMAL HIGH (ref 70–99)
Glucose-Capillary: 184 mg/dL — ABNORMAL HIGH (ref 70–99)
Glucose-Capillary: 189 mg/dL — ABNORMAL HIGH (ref 70–99)
Glucose-Capillary: 191 mg/dL — ABNORMAL HIGH (ref 70–99)
Glucose-Capillary: 208 mg/dL — ABNORMAL HIGH (ref 70–99)
Glucose-Capillary: 212 mg/dL — ABNORMAL HIGH (ref 70–99)
Glucose-Capillary: 225 mg/dL — ABNORMAL HIGH (ref 70–99)
Glucose-Capillary: 237 mg/dL — ABNORMAL HIGH (ref 70–99)
Glucose-Capillary: 240 mg/dL — ABNORMAL HIGH (ref 70–99)
Glucose-Capillary: 245 mg/dL — ABNORMAL HIGH (ref 70–99)
Glucose-Capillary: 246 mg/dL — ABNORMAL HIGH (ref 70–99)
Glucose-Capillary: 248 mg/dL — ABNORMAL HIGH (ref 70–99)
Glucose-Capillary: 252 mg/dL — ABNORMAL HIGH (ref 70–99)
Glucose-Capillary: 59 mg/dL — ABNORMAL LOW (ref 70–99)
Glucose-Capillary: 59 mg/dL — ABNORMAL LOW (ref 70–99)
Glucose-Capillary: 82 mg/dL (ref 70–99)
Glucose-Capillary: 82 mg/dL (ref 70–99)
Glucose-Capillary: 83 mg/dL (ref 70–99)
Glucose-Capillary: 93 mg/dL (ref 70–99)
Glucose-Capillary: 96 mg/dL (ref 70–99)

## 2010-08-07 LAB — COMPREHENSIVE METABOLIC PANEL
ALT: 9 U/L (ref 0–53)
ALT: 9 U/L (ref 0–53)
AST: 15 U/L (ref 0–37)
AST: 21 U/L (ref 0–37)
Albumin: 1.6 g/dL — ABNORMAL LOW (ref 3.5–5.2)
Albumin: 1.7 g/dL — ABNORMAL LOW (ref 3.5–5.2)
Alkaline Phosphatase: 70 U/L (ref 39–117)
Alkaline Phosphatase: 70 U/L (ref 39–117)
BUN: 23 mg/dL (ref 6–23)
BUN: 23 mg/dL (ref 6–23)
CO2: 24 mEq/L (ref 19–32)
CO2: 24 mEq/L (ref 19–32)
Calcium: 7.6 mg/dL — ABNORMAL LOW (ref 8.4–10.5)
Calcium: 7.7 mg/dL — ABNORMAL LOW (ref 8.4–10.5)
Chloride: 109 mEq/L (ref 96–112)
Chloride: 111 mEq/L (ref 96–112)
Creatinine, Ser: 1.27 mg/dL (ref 0.4–1.5)
Creatinine, Ser: 1.37 mg/dL (ref 0.4–1.5)
GFR calc Af Amer: >60 mL/min (ref >60)
GFR calc Af Amer: >60 mL/min (ref >60)
GFR calc non Af Amer: 55 mL/min — ABNORMAL LOW (ref >60)
GFR calc non Af Amer: 60 mL/min — ABNORMAL LOW (ref >60)
Glucose, Bld: 103 mg/dL — ABNORMAL HIGH (ref 70–99)
Glucose, Bld: 63 mg/dL — ABNORMAL LOW (ref 70–99)
Potassium: 4 mEq/L (ref 3.5–5.1)
Potassium: 4.2 mEq/L (ref 3.5–5.1)
Sodium: 136 mEq/L (ref 135–145)
Sodium: 138 mEq/L (ref 135–145)
Total Bilirubin: 0.5 mg/dL (ref 0.3–1.2)
Total Bilirubin: 0.6 mg/dL (ref 0.3–1.2)
Total Protein: 4.7 g/dL — ABNORMAL LOW (ref 6.0–8.3)
Total Protein: 5 g/dL — ABNORMAL LOW (ref 6.0–8.3)

## 2010-08-07 LAB — URINALYSIS, ROUTINE W REFLEX MICROSCOPIC
Bilirubin Urine: NEGATIVE
Bilirubin Urine: NEGATIVE
Glucose, UA: 250 mg/dL — AB
Glucose, UA: NEGATIVE mg/dL
Ketones, ur: NEGATIVE mg/dL
Ketones, ur: NEGATIVE mg/dL
Nitrite: NEGATIVE
Nitrite: NEGATIVE
Protein, ur: 100 mg/dL — AB
Protein, ur: 30 mg/dL — AB
Specific Gravity, Urine: 1.012 (ref 1.005–1.030)
Specific Gravity, Urine: 1.027 (ref 1.005–1.030)
Urobilinogen, UA: 0.2 mg/dL (ref 0.0–1.0)
Urobilinogen, UA: 0.2 mg/dL (ref 0.0–1.0)
pH: 5.5 (ref 5.0–8.0)
pH: 7 (ref 5.0–8.0)

## 2010-08-07 LAB — URINE CULTURE
Colony Count: 2000
Colony Count: >=100000

## 2010-08-07 LAB — URINE MICROSCOPIC-ADD ON

## 2010-08-07 LAB — POCT I-STAT 3, ART BLOOD GAS (G3+)
Acid-base deficit: 3 mmol/L — ABNORMAL HIGH (ref 0.0–2.0)
Bicarbonate: 18.7 mEq/L — ABNORMAL LOW (ref 20.0–24.0)
Bicarbonate: 21.5 mEq/L (ref 20.0–24.0)
O2 Saturation: 98 %
Patient temperature: 97.7
TCO2: 20 mmol/L (ref 0–100)
TCO2: 21 mmol/L (ref 0–100)
pCO2 arterial: 28.8 mmHg — ABNORMAL LOW (ref 35.0–45.0)
pCO2 arterial: 29.6 mmHg — ABNORMAL LOW (ref 35.0–45.0)
pH, Arterial: 7.419 (ref 7.350–7.450)
pH, Arterial: 7.432 (ref 7.350–7.450)
pO2, Arterial: 103 mmHg — ABNORMAL HIGH (ref 80.0–100.0)
pO2, Arterial: 71 mmHg — ABNORMAL LOW (ref 80.0–100.0)
pO2, Arterial: 95 mmHg (ref 80.0–100.0)

## 2010-08-07 LAB — POCT I-STAT, CHEM 8
BUN: 21 mg/dL (ref 6–23)
Potassium: 4.3 mEq/L (ref 3.5–5.1)
Sodium: 138 mEq/L (ref 135–145)
TCO2: 20 mmol/L (ref 0–100)

## 2010-08-07 LAB — PREALBUMIN: Prealbumin: 7.4 mg/dL — ABNORMAL LOW (ref 18.0–45.0)

## 2010-08-08 LAB — COMPREHENSIVE METABOLIC PANEL
ALT: 14 U/L (ref 0–53)
AST: 12 U/L (ref 0–37)
Albumin: 2.3 g/dL — ABNORMAL LOW (ref 3.5–5.2)
CO2: 25 mEq/L (ref 19–32)
Calcium: 8.3 mg/dL — ABNORMAL LOW (ref 8.4–10.5)
GFR calc Af Amer: >60 mL/min (ref >60)
Sodium: 134 mEq/L — ABNORMAL LOW (ref 135–145)
Total Protein: 5.7 g/dL — ABNORMAL LOW (ref 6.0–8.3)

## 2010-08-08 LAB — TYPE AND SCREEN
ABO/RH(D): A POS
Antibody Screen: NEGATIVE

## 2010-08-08 LAB — BASIC METABOLIC PANEL
BUN: 11 mg/dL (ref 6–23)
BUN: 18 mg/dL (ref 6–23)
BUN: 21 mg/dL (ref 6–23)
BUN: 24 mg/dL — ABNORMAL HIGH (ref 6–23)
BUN: 27 mg/dL — ABNORMAL HIGH (ref 6–23)
BUN: 31 mg/dL — ABNORMAL HIGH (ref 6–23)
CO2: 21 mEq/L (ref 19–32)
CO2: 23 mEq/L (ref 19–32)
CO2: 25 mEq/L (ref 19–32)
CO2: 25 mEq/L (ref 19–32)
CO2: 27 mEq/L (ref 19–32)
Calcium: 7.6 mg/dL — ABNORMAL LOW (ref 8.4–10.5)
Calcium: 7.6 mg/dL — ABNORMAL LOW (ref 8.4–10.5)
Calcium: 7.7 mg/dL — ABNORMAL LOW (ref 8.4–10.5)
Calcium: 7.8 mg/dL — ABNORMAL LOW (ref 8.4–10.5)
Calcium: 8.1 mg/dL — ABNORMAL LOW (ref 8.4–10.5)
Chloride: 100 mEq/L (ref 96–112)
Chloride: 100 mEq/L (ref 96–112)
Chloride: 104 mEq/L (ref 96–112)
Chloride: 108 mEq/L (ref 96–112)
Chloride: 111 mEq/L (ref 96–112)
Chloride: 99 mEq/L (ref 96–112)
Creatinine, Ser: 0.79 mg/dL (ref 0.4–1.5)
Creatinine, Ser: 1 mg/dL (ref 0.4–1.5)
Creatinine, Ser: 1.43 mg/dL (ref 0.4–1.5)
Creatinine, Ser: 1.65 mg/dL — ABNORMAL HIGH (ref 0.4–1.5)
Creatinine, Ser: 1.69 mg/dL — ABNORMAL HIGH (ref 0.4–1.5)
Creatinine, Ser: 1.8 mg/dL — ABNORMAL HIGH (ref 0.4–1.5)
GFR calc Af Amer: 52 mL/min — ABNORMAL LOW (ref >60)
GFR calc Af Amer: >60 mL/min (ref >60)
GFR calc Af Amer: >60 mL/min (ref >60)
GFR calc Af Amer: >60 mL/min (ref >60)
GFR calc Af Amer: >60 mL/min (ref >60)
GFR calc non Af Amer: 43 mL/min — ABNORMAL LOW (ref >60)
GFR calc non Af Amer: 52 mL/min — ABNORMAL LOW (ref >60)
GFR calc non Af Amer: >60 mL/min (ref >60)
GFR calc non Af Amer: >60 mL/min (ref >60)
GFR calc non Af Amer: >60 mL/min (ref >60)
Glucose, Bld: 108 mg/dL — ABNORMAL HIGH (ref 70–99)
Glucose, Bld: 160 mg/dL — ABNORMAL HIGH (ref 70–99)
Glucose, Bld: 170 mg/dL — ABNORMAL HIGH (ref 70–99)
Glucose, Bld: 208 mg/dL — ABNORMAL HIGH (ref 70–99)
Glucose, Bld: 245 mg/dL — ABNORMAL HIGH (ref 70–99)
Glucose, Bld: 265 mg/dL — ABNORMAL HIGH (ref 70–99)
Glucose, Bld: 303 mg/dL — ABNORMAL HIGH (ref 70–99)
Potassium: 3.8 mEq/L (ref 3.5–5.1)
Potassium: 3.9 mEq/L (ref 3.5–5.1)
Potassium: 4.3 mEq/L (ref 3.5–5.1)
Potassium: 4.5 mEq/L (ref 3.5–5.1)
Potassium: 4.8 mEq/L (ref 3.5–5.1)
Potassium: 4.9 mEq/L (ref 3.5–5.1)
Sodium: 132 mEq/L — ABNORMAL LOW (ref 135–145)
Sodium: 135 mEq/L (ref 135–145)
Sodium: 135 mEq/L (ref 135–145)
Sodium: 135 mEq/L (ref 135–145)
Sodium: 137 mEq/L (ref 135–145)
Sodium: 137 mEq/L (ref 135–145)

## 2010-08-08 LAB — TISSUE CULTURE: Culture: NO GROWTH

## 2010-08-08 LAB — CBC
HCT: 25 % — ABNORMAL LOW (ref 39.0–52.0)
HCT: 25.1 % — ABNORMAL LOW (ref 39.0–52.0)
HCT: 25.6 % — ABNORMAL LOW (ref 39.0–52.0)
HCT: 29.2 % — ABNORMAL LOW (ref 39.0–52.0)
HCT: 30 % — ABNORMAL LOW (ref 39.0–52.0)
Hemoglobin: 10 g/dL — ABNORMAL LOW (ref 13.0–17.0)
Hemoglobin: 8.1 g/dL — ABNORMAL LOW (ref 13.0–17.0)
Hemoglobin: 8.1 g/dL — ABNORMAL LOW (ref 13.0–17.0)
Hemoglobin: 8.3 g/dL — ABNORMAL LOW (ref 13.0–17.0)
Hemoglobin: 8.4 g/dL — ABNORMAL LOW (ref 13.0–17.0)
MCHC: 32.4 g/dL (ref 30.0–36.0)
MCHC: 32.9 g/dL (ref 30.0–36.0)
MCHC: 33 g/dL (ref 30.0–36.0)
MCHC: 33.2 g/dL (ref 30.0–36.0)
MCHC: 33.3 g/dL (ref 30.0–36.0)
MCHC: 33.3 g/dL (ref 30.0–36.0)
MCV: 84.5 fL (ref 78.0–100.0)
MCV: 85.4 fL (ref 78.0–100.0)
MCV: 85.7 fL (ref 78.0–100.0)
MCV: 86.1 fL (ref 78.0–100.0)
MCV: 86.3 fL (ref 78.0–100.0)
MCV: 87 fL (ref 78.0–100.0)
Platelets: 131 10*3/uL — ABNORMAL LOW (ref 150–400)
Platelets: 134 10*3/uL — ABNORMAL LOW (ref 150–400)
Platelets: 166 10*3/uL (ref 150–400)
Platelets: 183 10*3/uL (ref 150–400)
Platelets: 195 10*3/uL (ref 150–400)
RBC: 2.81 MIL/uL — ABNORMAL LOW (ref 4.22–5.81)
RBC: 2.88 MIL/uL — ABNORMAL LOW (ref 4.22–5.81)
RBC: 2.93 MIL/uL — ABNORMAL LOW (ref 4.22–5.81)
RBC: 2.93 MIL/uL — ABNORMAL LOW (ref 4.22–5.81)
RBC: 2.97 MIL/uL — ABNORMAL LOW (ref 4.22–5.81)
RBC: 2.98 MIL/uL — ABNORMAL LOW (ref 4.22–5.81)
RBC: 3.4 MIL/uL — ABNORMAL LOW (ref 4.22–5.81)
RBC: 3.47 MIL/uL — ABNORMAL LOW (ref 4.22–5.81)
RDW: 14.7 % (ref 11.5–15.5)
RDW: 15.5 % (ref 11.5–15.5)
RDW: 15.6 % — ABNORMAL HIGH (ref 11.5–15.5)
RDW: 15.6 % — ABNORMAL HIGH (ref 11.5–15.5)
RDW: 15.6 % — ABNORMAL HIGH (ref 11.5–15.5)
RDW: 15.9 % — ABNORMAL HIGH (ref 11.5–15.5)
RDW: 15.9 % — ABNORMAL HIGH (ref 11.5–15.5)
WBC: 10.1 10*3/uL (ref 4.0–10.5)
WBC: 13.6 10*3/uL — ABNORMAL HIGH (ref 4.0–10.5)
WBC: 15.4 10*3/uL — ABNORMAL HIGH (ref 4.0–10.5)
WBC: 8.1 10*3/uL (ref 4.0–10.5)
WBC: 9.2 10*3/uL (ref 4.0–10.5)

## 2010-08-08 LAB — POCT I-STAT 4, (NA,K, GLUC, HGB,HCT)
Glucose, Bld: 77 mg/dL (ref 70–99)
Glucose, Bld: 97 mg/dL (ref 70–99)
HCT: 24 % — ABNORMAL LOW (ref 39.0–52.0)
HCT: 28 % — ABNORMAL LOW (ref 39.0–52.0)
Hemoglobin: 8.5 g/dL — ABNORMAL LOW (ref 13.0–17.0)
Hemoglobin: 9.5 g/dL — ABNORMAL LOW (ref 13.0–17.0)
Potassium: 4.5 mEq/L (ref 3.5–5.1)
Sodium: 140 mEq/L (ref 135–145)
Sodium: 140 mEq/L (ref 135–145)

## 2010-08-08 LAB — GLUCOSE, CAPILLARY
Glucose-Capillary: 109 mg/dL — ABNORMAL HIGH (ref 70–99)
Glucose-Capillary: 113 mg/dL — ABNORMAL HIGH (ref 70–99)
Glucose-Capillary: 115 mg/dL — ABNORMAL HIGH (ref 70–99)
Glucose-Capillary: 133 mg/dL — ABNORMAL HIGH (ref 70–99)
Glucose-Capillary: 141 mg/dL — ABNORMAL HIGH (ref 70–99)
Glucose-Capillary: 142 mg/dL — ABNORMAL HIGH (ref 70–99)
Glucose-Capillary: 150 mg/dL — ABNORMAL HIGH (ref 70–99)
Glucose-Capillary: 151 mg/dL — ABNORMAL HIGH (ref 70–99)
Glucose-Capillary: 159 mg/dL — ABNORMAL HIGH (ref 70–99)
Glucose-Capillary: 166 mg/dL — ABNORMAL HIGH (ref 70–99)
Glucose-Capillary: 167 mg/dL — ABNORMAL HIGH (ref 70–99)
Glucose-Capillary: 176 mg/dL — ABNORMAL HIGH (ref 70–99)
Glucose-Capillary: 176 mg/dL — ABNORMAL HIGH (ref 70–99)
Glucose-Capillary: 179 mg/dL — ABNORMAL HIGH (ref 70–99)
Glucose-Capillary: 189 mg/dL — ABNORMAL HIGH (ref 70–99)
Glucose-Capillary: 189 mg/dL — ABNORMAL HIGH (ref 70–99)
Glucose-Capillary: 191 mg/dL — ABNORMAL HIGH (ref 70–99)
Glucose-Capillary: 192 mg/dL — ABNORMAL HIGH (ref 70–99)
Glucose-Capillary: 195 mg/dL — ABNORMAL HIGH (ref 70–99)
Glucose-Capillary: 195 mg/dL — ABNORMAL HIGH (ref 70–99)
Glucose-Capillary: 197 mg/dL — ABNORMAL HIGH (ref 70–99)
Glucose-Capillary: 198 mg/dL — ABNORMAL HIGH (ref 70–99)
Glucose-Capillary: 204 mg/dL — ABNORMAL HIGH (ref 70–99)
Glucose-Capillary: 207 mg/dL — ABNORMAL HIGH (ref 70–99)
Glucose-Capillary: 215 mg/dL — ABNORMAL HIGH (ref 70–99)
Glucose-Capillary: 216 mg/dL — ABNORMAL HIGH (ref 70–99)
Glucose-Capillary: 218 mg/dL — ABNORMAL HIGH (ref 70–99)
Glucose-Capillary: 221 mg/dL — ABNORMAL HIGH (ref 70–99)
Glucose-Capillary: 221 mg/dL — ABNORMAL HIGH (ref 70–99)
Glucose-Capillary: 244 mg/dL — ABNORMAL HIGH (ref 70–99)
Glucose-Capillary: 245 mg/dL — ABNORMAL HIGH (ref 70–99)
Glucose-Capillary: 246 mg/dL — ABNORMAL HIGH (ref 70–99)
Glucose-Capillary: 257 mg/dL — ABNORMAL HIGH (ref 70–99)
Glucose-Capillary: 258 mg/dL — ABNORMAL HIGH (ref 70–99)
Glucose-Capillary: 259 mg/dL — ABNORMAL HIGH (ref 70–99)
Glucose-Capillary: 261 mg/dL — ABNORMAL HIGH (ref 70–99)
Glucose-Capillary: 71 mg/dL (ref 70–99)

## 2010-08-08 LAB — PREALBUMIN
Prealbumin: 6.9 mg/dL — ABNORMAL LOW (ref 18.0–45.0)
Prealbumin: 7.7 mg/dL — ABNORMAL LOW (ref 18.0–45.0)

## 2010-08-08 LAB — URINE CULTURE
Colony Count: 80000
Colony Count: NO GROWTH
Culture: NO GROWTH

## 2010-08-08 LAB — URINE MICROSCOPIC-ADD ON

## 2010-08-08 LAB — URINALYSIS, ROUTINE W REFLEX MICROSCOPIC
Bilirubin Urine: NEGATIVE
Glucose, UA: 100 mg/dL — AB
Ketones, ur: NEGATIVE mg/dL
Nitrite: NEGATIVE
Protein, ur: 100 mg/dL — AB
Specific Gravity, Urine: 1.021 (ref 1.005–1.030)
Urobilinogen, UA: 0.2 mg/dL (ref 0.0–1.0)
pH: 5 (ref 5.0–8.0)

## 2010-08-08 LAB — POCT I-STAT 3, ART BLOOD GAS (G3+)
Acid-Base Excess: 6 mmol/L — ABNORMAL HIGH (ref 0.0–2.0)
Bicarbonate: 18.4 mEq/L — ABNORMAL LOW (ref 20.0–24.0)
Bicarbonate: 20.3 mEq/L (ref 20.0–24.0)
O2 Saturation: 99 %
O2 Saturation: 99 %
Patient temperature: 93
Patient temperature: 97.8
Patient temperature: 98.7
TCO2: 26 mmol/L (ref 0–100)
pCO2 arterial: 21.5 mmHg — ABNORMAL LOW (ref 35.0–45.0)
pCO2 arterial: 30.2 mmHg — ABNORMAL LOW (ref 35.0–45.0)
pCO2 arterial: 32 mmHg — ABNORMAL LOW (ref 35.0–45.0)
pH, Arterial: 7.365 (ref 7.350–7.450)
pH, Arterial: 7.421 (ref 7.350–7.450)
pO2, Arterial: 62 mmHg — ABNORMAL LOW (ref 80.0–100.0)
pO2, Arterial: 91 mmHg (ref 80.0–100.0)

## 2010-08-08 LAB — ANAEROBIC CULTURE

## 2010-08-08 LAB — CULTURE, BLOOD (ROUTINE X 2): Culture: NO GROWTH

## 2010-08-08 LAB — WOUND CULTURE

## 2010-08-08 LAB — PROTIME-INR
INR: 1.29 (ref 0.00–1.49)
Prothrombin Time: 16 seconds — ABNORMAL HIGH (ref 11.6–15.2)

## 2010-08-08 LAB — APTT: aPTT: 39 seconds — ABNORMAL HIGH (ref 24–37)

## 2010-08-08 LAB — BODY FLUID CULTURE: Culture: NO GROWTH

## 2010-08-08 LAB — HEMOCCULT GUIAC POC 1CARD (OFFICE): Fecal Occult Bld: NEGATIVE

## 2010-08-08 LAB — PREPARE RBC (CROSSMATCH)

## 2010-08-09 LAB — POCT I-STAT 4, (NA,K, GLUC, HGB,HCT)
Glucose, Bld: 101 mg/dL — ABNORMAL HIGH (ref 70–99)
Glucose, Bld: 109 mg/dL — ABNORMAL HIGH (ref 70–99)
Glucose, Bld: 125 mg/dL — ABNORMAL HIGH (ref 70–99)
Glucose, Bld: 131 mg/dL — ABNORMAL HIGH (ref 70–99)
Glucose, Bld: 150 mg/dL — ABNORMAL HIGH (ref 70–99)
Glucose, Bld: 75 mg/dL (ref 70–99)
HCT: 23 % — ABNORMAL LOW (ref 39.0–52.0)
HCT: 23 % — ABNORMAL LOW (ref 39.0–52.0)
HCT: 24 % — ABNORMAL LOW (ref 39.0–52.0)
HCT: 25 % — ABNORMAL LOW (ref 39.0–52.0)
HCT: 26 % — ABNORMAL LOW (ref 39.0–52.0)
HCT: 26 % — ABNORMAL LOW (ref 39.0–52.0)
HCT: 28 % — ABNORMAL LOW (ref 39.0–52.0)
HCT: 30 % — ABNORMAL LOW (ref 39.0–52.0)
Hemoglobin: 10.2 g/dL — ABNORMAL LOW (ref 13.0–17.0)
Hemoglobin: 7.8 g/dL — ABNORMAL LOW (ref 13.0–17.0)
Hemoglobin: 8.2 g/dL — ABNORMAL LOW (ref 13.0–17.0)
Hemoglobin: 8.2 g/dL — ABNORMAL LOW (ref 13.0–17.0)
Hemoglobin: 8.2 g/dL — ABNORMAL LOW (ref 13.0–17.0)
Hemoglobin: 8.5 g/dL — ABNORMAL LOW (ref 13.0–17.0)
Hemoglobin: 8.8 g/dL — ABNORMAL LOW (ref 13.0–17.0)
Hemoglobin: 8.8 g/dL — ABNORMAL LOW (ref 13.0–17.0)
Hemoglobin: 9.5 g/dL — ABNORMAL LOW (ref 13.0–17.0)
Potassium: 3.8 mEq/L (ref 3.5–5.1)
Potassium: 4.1 mEq/L (ref 3.5–5.1)
Potassium: 4.3 mEq/L (ref 3.5–5.1)
Potassium: 4.6 mEq/L (ref 3.5–5.1)
Potassium: 4.6 mEq/L (ref 3.5–5.1)
Potassium: 4.7 mEq/L (ref 3.5–5.1)
Sodium: 134 mEq/L — ABNORMAL LOW (ref 135–145)
Sodium: 136 mEq/L (ref 135–145)
Sodium: 137 mEq/L (ref 135–145)
Sodium: 137 mEq/L (ref 135–145)
Sodium: 137 mEq/L (ref 135–145)
Sodium: 137 mEq/L (ref 135–145)
Sodium: 138 mEq/L (ref 135–145)

## 2010-08-09 LAB — BRAIN NATRIURETIC PEPTIDE
Pro B Natriuretic peptide (BNP): 688 pg/mL — ABNORMAL HIGH (ref 0.0–100.0)
Pro B Natriuretic peptide (BNP): 715 pg/mL — ABNORMAL HIGH (ref 0.0–100.0)
Pro B Natriuretic peptide (BNP): 260 pg/mL — ABNORMAL HIGH (ref 0.0–100.0)

## 2010-08-09 LAB — POCT I-STAT 3, ART BLOOD GAS (G3+)
Acid-base deficit: 1 mmol/L (ref 0.0–2.0)
Acid-base deficit: 2 mmol/L (ref 0.0–2.0)
Bicarbonate: 20.4 mEq/L (ref 20.0–24.0)
Bicarbonate: 23.5 mEq/L (ref 20.0–24.0)
Bicarbonate: 23.8 mEq/L (ref 20.0–24.0)
Bicarbonate: 24.2 mEq/L — ABNORMAL HIGH (ref 20.0–24.0)
O2 Saturation: 100 %
O2 Saturation: 100 %
TCO2: 21 mmol/L (ref 0–100)
TCO2: 25 mmol/L (ref 0–100)
TCO2: 25 mmol/L (ref 0–100)
pCO2 arterial: 33.3 mmHg — ABNORMAL LOW (ref 35.0–45.0)
pCO2 arterial: 38.8 mmHg (ref 35.0–45.0)
pCO2 arterial: 38.9 mmHg (ref 35.0–45.0)
pCO2 arterial: 39.9 mmHg (ref 35.0–45.0)
pCO2 arterial: 40.4 mmHg (ref 35.0–45.0)
pCO2 arterial: 40.5 mmHg (ref 35.0–45.0)
pH, Arterial: 7.33 — ABNORMAL LOW (ref 7.350–7.450)
pH, Arterial: 7.365 (ref 7.350–7.450)
pH, Arterial: 7.385 (ref 7.350–7.450)
pH, Arterial: 7.394 (ref 7.350–7.450)
pH, Arterial: 7.395 (ref 7.350–7.450)
pH, Arterial: 7.407 (ref 7.350–7.450)
pO2, Arterial: 232 mmHg — ABNORMAL HIGH (ref 80.0–100.0)
pO2, Arterial: 397 mmHg — ABNORMAL HIGH (ref 80.0–100.0)

## 2010-08-09 LAB — BASIC METABOLIC PANEL
BUN: 16 mg/dL (ref 6–23)
BUN: 19 mg/dL (ref 6–23)
BUN: 20 mg/dL (ref 6–23)
BUN: 13 mg/dL (ref 6–23)
BUN: 21 mg/dL (ref 6–23)
BUN: 22 mg/dL (ref 6–23)
BUN: 24 mg/dL — ABNORMAL HIGH (ref 6–23)
BUN: 24 mg/dL — ABNORMAL HIGH (ref 6–23)
CO2: 28 mEq/L (ref 19–32)
CO2: 29 mEq/L (ref 19–32)
CO2: 21 mEq/L (ref 19–32)
CO2: 23 mEq/L (ref 19–32)
CO2: 26 mEq/L (ref 19–32)
CO2: 26 mEq/L (ref 19–32)
CO2: 27 mEq/L (ref 19–32)
Calcium: 8.2 mg/dL — ABNORMAL LOW (ref 8.4–10.5)
Calcium: 7.8 mg/dL — ABNORMAL LOW (ref 8.4–10.5)
Calcium: 8.3 mg/dL — ABNORMAL LOW (ref 8.4–10.5)
Calcium: 8.3 mg/dL — ABNORMAL LOW (ref 8.4–10.5)
Calcium: 8.5 mg/dL (ref 8.4–10.5)
Calcium: 9.1 mg/dL (ref 8.4–10.5)
Chloride: 103 mEq/L (ref 96–112)
Chloride: 104 mEq/L (ref 96–112)
Chloride: 99 mEq/L (ref 96–112)
Chloride: 101 mEq/L (ref 96–112)
Chloride: 103 mEq/L (ref 96–112)
Chloride: 105 mEq/L (ref 96–112)
Chloride: 105 mEq/L (ref 96–112)
Chloride: 111 mEq/L (ref 96–112)
Chloride: 113 mEq/L — ABNORMAL HIGH (ref 96–112)
Creatinine, Ser: 1.15 mg/dL (ref 0.4–1.5)
Creatinine, Ser: 0.86 mg/dL (ref 0.4–1.5)
Creatinine, Ser: 0.88 mg/dL (ref 0.4–1.5)
Creatinine, Ser: 1.09 mg/dL (ref 0.4–1.5)
Creatinine, Ser: 1.15 mg/dL (ref 0.4–1.5)
Creatinine, Ser: 1.17 mg/dL (ref 0.4–1.5)
Creatinine, Ser: 1.24 mg/dL (ref 0.4–1.5)
GFR calc Af Amer: >60 mL/min (ref >60)
GFR calc Af Amer: >60 mL/min (ref >60)
GFR calc Af Amer: >60 mL/min (ref >60)
GFR calc Af Amer: >60 mL/min (ref >60)
GFR calc Af Amer: >60 mL/min (ref >60)
GFR calc Af Amer: >60 mL/min (ref >60)
GFR calc Af Amer: >60 mL/min (ref >60)
GFR calc non Af Amer: 58 mL/min — ABNORMAL LOW (ref >60)
GFR calc non Af Amer: >60 mL/min (ref >60)
GFR calc non Af Amer: >60 mL/min (ref >60)
GFR calc non Af Amer: >60 mL/min (ref >60)
GFR calc non Af Amer: >60 mL/min (ref >60)
GFR calc non Af Amer: >60 mL/min (ref >60)
GFR calc non Af Amer: >60 mL/min (ref >60)
GFR calc non Af Amer: >60 mL/min (ref >60)
Glucose, Bld: 112 mg/dL — ABNORMAL HIGH (ref 70–99)
Glucose, Bld: 124 mg/dL — ABNORMAL HIGH (ref 70–99)
Glucose, Bld: 87 mg/dL (ref 70–99)
Glucose, Bld: 117 mg/dL — ABNORMAL HIGH (ref 70–99)
Glucose, Bld: 202 mg/dL — ABNORMAL HIGH (ref 70–99)
Glucose, Bld: 221 mg/dL — ABNORMAL HIGH (ref 70–99)
Glucose, Bld: 225 mg/dL — ABNORMAL HIGH (ref 70–99)
Glucose, Bld: 71 mg/dL (ref 70–99)
Potassium: 3.3 mEq/L — ABNORMAL LOW (ref 3.5–5.1)
Potassium: 4.3 mEq/L (ref 3.5–5.1)
Potassium: 4.4 mEq/L (ref 3.5–5.1)
Potassium: 4 mEq/L (ref 3.5–5.1)
Potassium: 4.3 mEq/L (ref 3.5–5.1)
Potassium: 4.3 mEq/L (ref 3.5–5.1)
Potassium: 4.8 mEq/L (ref 3.5–5.1)
Potassium: 5.4 mEq/L — ABNORMAL HIGH (ref 3.5–5.1)
Potassium: 5.7 mEq/L — ABNORMAL HIGH (ref 3.5–5.1)
Sodium: 134 mEq/L — ABNORMAL LOW (ref 135–145)
Sodium: 136 mEq/L (ref 135–145)
Sodium: 136 mEq/L (ref 135–145)
Sodium: 132 mEq/L — ABNORMAL LOW (ref 135–145)
Sodium: 133 mEq/L — ABNORMAL LOW (ref 135–145)
Sodium: 135 mEq/L (ref 135–145)
Sodium: 136 mEq/L (ref 135–145)
Sodium: 137 mEq/L (ref 135–145)
Sodium: 141 mEq/L (ref 135–145)

## 2010-08-09 LAB — PREPARE FRESH FROZEN PLASMA

## 2010-08-09 LAB — COMPREHENSIVE METABOLIC PANEL
ALT: 15 U/L (ref 0–53)
ALT: <8 U/L (ref 0–53)
ALT: 20 U/L (ref 0–53)
AST: 19 U/L (ref 0–37)
AST: 40 U/L — ABNORMAL HIGH (ref 0–37)
Albumin: 3 g/dL — ABNORMAL LOW (ref 3.5–5.2)
Albumin: 3.1 g/dL — ABNORMAL LOW (ref 3.5–5.2)
Albumin: 3 g/dL — ABNORMAL LOW (ref 3.5–5.2)
Alkaline Phosphatase: 108 U/L (ref 39–117)
Alkaline Phosphatase: 64 U/L (ref 39–117)
Alkaline Phosphatase: 37 U/L — ABNORMAL LOW (ref 39–117)
BUN: 17 mg/dL (ref 6–23)
BUN: 26 mg/dL — ABNORMAL HIGH (ref 6–23)
CO2: 27 mEq/L (ref 19–32)
CO2: 24 mEq/L (ref 19–32)
CO2: 25 mEq/L (ref 19–32)
Calcium: 8.2 mg/dL — ABNORMAL LOW (ref 8.4–10.5)
Chloride: 100 mEq/L (ref 96–112)
Chloride: 103 mEq/L (ref 96–112)
Chloride: 108 mEq/L (ref 96–112)
Creatinine, Ser: 1.22 mg/dL (ref 0.4–1.5)
Creatinine, Ser: 0.99 mg/dL (ref 0.4–1.5)
Creatinine, Ser: 1.14 mg/dL (ref 0.4–1.5)
GFR calc Af Amer: >60 mL/min (ref >60)
GFR calc Af Amer: >60 mL/min (ref >60)
GFR calc non Af Amer: >60 mL/min (ref >60)
GFR calc non Af Amer: >60 mL/min (ref >60)
GFR calc non Af Amer: >60 mL/min (ref >60)
Glucose, Bld: 75 mg/dL (ref 70–99)
Glucose, Bld: 62 mg/dL — ABNORMAL LOW (ref 70–99)
Potassium: 4.6 mEq/L (ref 3.5–5.1)
Potassium: 5.4 mEq/L — ABNORMAL HIGH (ref 3.5–5.1)
Potassium: 3.9 mEq/L (ref 3.5–5.1)
Sodium: 135 mEq/L (ref 135–145)
Sodium: 138 mEq/L (ref 135–145)
Total Bilirubin: 0.7 mg/dL (ref 0.3–1.2)
Total Bilirubin: 0.4 mg/dL (ref 0.3–1.2)
Total Bilirubin: 0.6 mg/dL (ref 0.3–1.2)
Total Protein: 6.3 g/dL (ref 6.0–8.3)
Total Protein: 5.1 g/dL — ABNORMAL LOW (ref 6.0–8.3)

## 2010-08-09 LAB — GLUCOSE, CAPILLARY
Glucose-Capillary: 114 mg/dL — ABNORMAL HIGH (ref 70–99)
Glucose-Capillary: 115 mg/dL — ABNORMAL HIGH (ref 70–99)
Glucose-Capillary: 176 mg/dL — ABNORMAL HIGH (ref 70–99)
Glucose-Capillary: 178 mg/dL — ABNORMAL HIGH (ref 70–99)
Glucose-Capillary: 255 mg/dL — ABNORMAL HIGH (ref 70–99)
Glucose-Capillary: 74 mg/dL (ref 70–99)
Glucose-Capillary: 80 mg/dL (ref 70–99)
Glucose-Capillary: 88 mg/dL (ref 70–99)
Glucose-Capillary: 90 mg/dL (ref 70–99)
Glucose-Capillary: 110 mg/dL — ABNORMAL HIGH (ref 70–99)
Glucose-Capillary: 111 mg/dL — ABNORMAL HIGH (ref 70–99)
Glucose-Capillary: 113 mg/dL — ABNORMAL HIGH (ref 70–99)
Glucose-Capillary: 116 mg/dL — ABNORMAL HIGH (ref 70–99)
Glucose-Capillary: 121 mg/dL — ABNORMAL HIGH (ref 70–99)
Glucose-Capillary: 122 mg/dL — ABNORMAL HIGH (ref 70–99)
Glucose-Capillary: 134 mg/dL — ABNORMAL HIGH (ref 70–99)
Glucose-Capillary: 134 mg/dL — ABNORMAL HIGH (ref 70–99)
Glucose-Capillary: 134 mg/dL — ABNORMAL HIGH (ref 70–99)
Glucose-Capillary: 135 mg/dL — ABNORMAL HIGH (ref 70–99)
Glucose-Capillary: 140 mg/dL — ABNORMAL HIGH (ref 70–99)
Glucose-Capillary: 145 mg/dL — ABNORMAL HIGH (ref 70–99)
Glucose-Capillary: 152 mg/dL — ABNORMAL HIGH (ref 70–99)
Glucose-Capillary: 157 mg/dL — ABNORMAL HIGH (ref 70–99)
Glucose-Capillary: 168 mg/dL — ABNORMAL HIGH (ref 70–99)
Glucose-Capillary: 174 mg/dL — ABNORMAL HIGH (ref 70–99)
Glucose-Capillary: 180 mg/dL — ABNORMAL HIGH (ref 70–99)
Glucose-Capillary: 180 mg/dL — ABNORMAL HIGH (ref 70–99)
Glucose-Capillary: 184 mg/dL — ABNORMAL HIGH (ref 70–99)
Glucose-Capillary: 192 mg/dL — ABNORMAL HIGH (ref 70–99)
Glucose-Capillary: 192 mg/dL — ABNORMAL HIGH (ref 70–99)
Glucose-Capillary: 198 mg/dL — ABNORMAL HIGH (ref 70–99)
Glucose-Capillary: 208 mg/dL — ABNORMAL HIGH (ref 70–99)
Glucose-Capillary: 219 mg/dL — ABNORMAL HIGH (ref 70–99)
Glucose-Capillary: 222 mg/dL — ABNORMAL HIGH (ref 70–99)
Glucose-Capillary: 67 mg/dL — ABNORMAL LOW (ref 70–99)
Glucose-Capillary: 77 mg/dL (ref 70–99)
Glucose-Capillary: 83 mg/dL (ref 70–99)
Glucose-Capillary: 85 mg/dL (ref 70–99)
Glucose-Capillary: 99 mg/dL (ref 70–99)
Glucose-Capillary: 99 mg/dL (ref 70–99)

## 2010-08-09 LAB — CBC
HCT: 23.4 % — ABNORMAL LOW (ref 39.0–52.0)
HCT: 25.3 % — ABNORMAL LOW (ref 39.0–52.0)
HCT: 19.3 % — ABNORMAL LOW (ref 39.0–52.0)
HCT: 21.7 % — ABNORMAL LOW (ref 39.0–52.0)
HCT: 22.3 % — ABNORMAL LOW (ref 39.0–52.0)
HCT: 23 % — ABNORMAL LOW (ref 39.0–52.0)
HCT: 24.9 % — ABNORMAL LOW (ref 39.0–52.0)
HCT: 25.2 % — ABNORMAL LOW (ref 39.0–52.0)
HCT: 25.4 % — ABNORMAL LOW (ref 39.0–52.0)
HCT: 25.5 % — ABNORMAL LOW (ref 39.0–52.0)
HCT: 33.1 % — ABNORMAL LOW (ref 39.0–52.0)
HCT: 33.3 % — ABNORMAL LOW (ref 39.0–52.0)
HCT: 33.4 % — ABNORMAL LOW (ref 39.0–52.0)
HCT: 34 % — ABNORMAL LOW (ref 39.0–52.0)
HCT: 35 % — ABNORMAL LOW (ref 39.0–52.0)
Hemoglobin: 7.8 g/dL — ABNORMAL LOW (ref 13.0–17.0)
Hemoglobin: 8.9 g/dL — ABNORMAL LOW (ref 13.0–17.0)
Hemoglobin: 11.5 g/dL — ABNORMAL LOW (ref 13.0–17.0)
Hemoglobin: 11.6 g/dL — ABNORMAL LOW (ref 13.0–17.0)
Hemoglobin: 11.7 g/dL — ABNORMAL LOW (ref 13.0–17.0)
Hemoglobin: 12.1 g/dL — ABNORMAL LOW (ref 13.0–17.0)
Hemoglobin: 6.8 g/dL — CL (ref 13.0–17.0)
Hemoglobin: 7.7 g/dL — ABNORMAL LOW (ref 13.0–17.0)
Hemoglobin: 7.7 g/dL — ABNORMAL LOW (ref 13.0–17.0)
Hemoglobin: 8 g/dL — ABNORMAL LOW (ref 13.0–17.0)
Hemoglobin: 8.6 g/dL — ABNORMAL LOW (ref 13.0–17.0)
Hemoglobin: 8.7 g/dL — ABNORMAL LOW (ref 13.0–17.0)
Hemoglobin: 8.8 g/dL — ABNORMAL LOW (ref 13.0–17.0)
Hemoglobin: 8.9 g/dL — ABNORMAL LOW (ref 13.0–17.0)
MCHC: 34.5 g/dL (ref 30.0–36.0)
MCHC: 34.6 g/dL (ref 30.0–36.0)
MCHC: 34.7 g/dL (ref 30.0–36.0)
MCHC: 34.7 g/dL (ref 30.0–36.0)
MCHC: 34.7 g/dL (ref 30.0–36.0)
MCHC: 34.7 g/dL (ref 30.0–36.0)
MCHC: 34.7 g/dL (ref 30.0–36.0)
MCHC: 34.8 g/dL (ref 30.0–36.0)
MCHC: 34.9 g/dL (ref 30.0–36.0)
MCHC: 35.1 g/dL (ref 30.0–36.0)
MCHC: 35.1 g/dL (ref 30.0–36.0)
MCHC: 35.4 g/dL (ref 30.0–36.0)
MCV: 88.6 fL (ref 78.0–100.0)
MCV: 89.2 fL (ref 78.0–100.0)
MCV: 86.8 fL (ref 78.0–100.0)
MCV: 86.9 fL (ref 78.0–100.0)
MCV: 87.3 fL (ref 78.0–100.0)
MCV: 87.3 fL (ref 78.0–100.0)
MCV: 87.4 fL (ref 78.0–100.0)
MCV: 88 fL (ref 78.0–100.0)
MCV: 88.5 fL (ref 78.0–100.0)
MCV: 88.5 fL (ref 78.0–100.0)
MCV: 88.6 fL (ref 78.0–100.0)
MCV: 88.6 fL (ref 78.0–100.0)
MCV: 88.8 fL (ref 78.0–100.0)
MCV: 89 fL (ref 78.0–100.0)
MCV: 89.4 fL (ref 78.0–100.0)
Platelets: 157 10*3/uL (ref 150–400)
Platelets: 175 10*3/uL (ref 150–400)
Platelets: 181 10*3/uL (ref 150–400)
Platelets: 107 10*3/uL — ABNORMAL LOW (ref 150–400)
Platelets: 109 10*3/uL — ABNORMAL LOW (ref 150–400)
Platelets: 111 10*3/uL — ABNORMAL LOW (ref 150–400)
Platelets: 131 10*3/uL — ABNORMAL LOW (ref 150–400)
Platelets: 145 10*3/uL — ABNORMAL LOW (ref 150–400)
Platelets: 172 10*3/uL (ref 150–400)
Platelets: 173 10*3/uL (ref 150–400)
Platelets: 183 10*3/uL (ref 150–400)
Platelets: 191 10*3/uL (ref 150–400)
Platelets: 90 10*3/uL — ABNORMAL LOW (ref 150–400)
Platelets: 90 10*3/uL — ABNORMAL LOW (ref 150–400)
Platelets: 94 10*3/uL — ABNORMAL LOW (ref 150–400)
RBC: 2.18 MIL/uL — ABNORMAL LOW (ref 4.22–5.81)
RBC: 2.49 MIL/uL — ABNORMAL LOW (ref 4.22–5.81)
RBC: 2.52 MIL/uL — ABNORMAL LOW (ref 4.22–5.81)
RBC: 2.6 MIL/uL — ABNORMAL LOW (ref 4.22–5.81)
RBC: 2.8 MIL/uL — ABNORMAL LOW (ref 4.22–5.81)
RBC: 2.82 MIL/uL — ABNORMAL LOW (ref 4.22–5.81)
RBC: 2.87 MIL/uL — ABNORMAL LOW (ref 4.22–5.81)
RBC: 2.88 MIL/uL — ABNORMAL LOW (ref 4.22–5.81)
RBC: 3.79 MIL/uL — ABNORMAL LOW (ref 4.22–5.81)
RBC: 3.82 MIL/uL — ABNORMAL LOW (ref 4.22–5.81)
RBC: 3.84 MIL/uL — ABNORMAL LOW (ref 4.22–5.81)
RBC: 3.87 MIL/uL — ABNORMAL LOW (ref 4.22–5.81)
RDW: 14.6 % (ref 11.5–15.5)
RDW: 14.6 % (ref 11.5–15.5)
RDW: 13.5 % (ref 11.5–15.5)
RDW: 13.5 % (ref 11.5–15.5)
RDW: 13.6 % (ref 11.5–15.5)
RDW: 13.6 % (ref 11.5–15.5)
RDW: 13.7 % (ref 11.5–15.5)
RDW: 13.7 % (ref 11.5–15.5)
RDW: 13.8 % (ref 11.5–15.5)
RDW: 13.8 % (ref 11.5–15.5)
RDW: 13.8 % (ref 11.5–15.5)
RDW: 13.9 % (ref 11.5–15.5)
RDW: 14.1 % (ref 11.5–15.5)
RDW: 14.1 % (ref 11.5–15.5)
WBC: 11.4 10*3/uL — ABNORMAL HIGH (ref 4.0–10.5)
WBC: 11.7 10*3/uL — ABNORMAL HIGH (ref 4.0–10.5)
WBC: 17.5 10*3/uL — ABNORMAL HIGH (ref 4.0–10.5)
WBC: 10.2 10*3/uL (ref 4.0–10.5)
WBC: 10.8 10*3/uL — ABNORMAL HIGH (ref 4.0–10.5)
WBC: 12.3 10*3/uL — ABNORMAL HIGH (ref 4.0–10.5)
WBC: 13.6 10*3/uL — ABNORMAL HIGH (ref 4.0–10.5)
WBC: 15.3 10*3/uL — ABNORMAL HIGH (ref 4.0–10.5)
WBC: 15.3 10*3/uL — ABNORMAL HIGH (ref 4.0–10.5)
WBC: 16.5 10*3/uL — ABNORMAL HIGH (ref 4.0–10.5)
WBC: 18.8 10*3/uL — ABNORMAL HIGH (ref 4.0–10.5)
WBC: 20.9 10*3/uL — ABNORMAL HIGH (ref 4.0–10.5)
WBC: 9.3 10*3/uL (ref 4.0–10.5)
WBC: 9.5 10*3/uL (ref 4.0–10.5)
WBC: 9.6 10*3/uL (ref 4.0–10.5)

## 2010-08-09 LAB — POCT I-STAT, CHEM 8
BUN: 21 mg/dL (ref 6–23)
Calcium, Ion: 1.06 mmol/L — ABNORMAL LOW (ref 1.12–1.32)
Calcium, Ion: 1.22 mmol/L (ref 1.12–1.32)
Chloride: 104 mEq/L (ref 96–112)
Creatinine, Ser: 1.1 mg/dL (ref 0.4–1.5)
Creatinine, Ser: 1.2 mg/dL (ref 0.4–1.5)
Glucose, Bld: 73 mg/dL (ref 70–99)
Glucose, Bld: 200 mg/dL — ABNORMAL HIGH (ref 70–99)
HCT: 26 % — ABNORMAL LOW (ref 39.0–52.0)
HCT: 19 % — ABNORMAL LOW (ref 39.0–52.0)
HCT: 25 % — ABNORMAL LOW (ref 39.0–52.0)
HCT: 35 % — ABNORMAL LOW (ref 39.0–52.0)
Hemoglobin: 8.8 g/dL — ABNORMAL LOW (ref 13.0–17.0)
Hemoglobin: 11.9 g/dL — ABNORMAL LOW (ref 13.0–17.0)
Hemoglobin: 6.5 g/dL — CL (ref 13.0–17.0)
Potassium: 4.3 mEq/L (ref 3.5–5.1)
Potassium: 5.7 mEq/L — ABNORMAL HIGH (ref 3.5–5.1)
Sodium: 139 mEq/L (ref 135–145)
Sodium: 140 mEq/L (ref 135–145)
TCO2: 20 mmol/L (ref 0–100)

## 2010-08-09 LAB — CROSSMATCH
ABO/RH(D): A POS
Antibody Screen: NEGATIVE

## 2010-08-09 LAB — URINALYSIS, ROUTINE W REFLEX MICROSCOPIC
Bilirubin Urine: NEGATIVE
Glucose, UA: NEGATIVE mg/dL
Hgb urine dipstick: NEGATIVE
Ketones, ur: NEGATIVE mg/dL
Ketones, ur: NEGATIVE mg/dL
Nitrite: NEGATIVE
Protein, ur: NEGATIVE mg/dL
Protein, ur: 100 mg/dL — AB
Specific Gravity, Urine: 1.024 (ref 1.005–1.030)
Urobilinogen, UA: 0.2 mg/dL (ref 0.0–1.0)
Urobilinogen, UA: 0.2 mg/dL (ref 0.0–1.0)
pH: 6 (ref 5.0–8.0)

## 2010-08-09 LAB — CREATININE, SERUM
Creatinine, Ser: 1.05 mg/dL (ref 0.4–1.5)
Creatinine, Ser: 1.31 mg/dL (ref 0.4–1.5)
GFR calc Af Amer: >60 mL/min (ref >60)
GFR calc Af Amer: >60 mL/min (ref >60)
GFR calc non Af Amer: 57 mL/min — ABNORMAL LOW (ref >60)
GFR calc non Af Amer: >60 mL/min (ref >60)

## 2010-08-09 LAB — PREPARE PLATELETS

## 2010-08-09 LAB — DIFFERENTIAL
Basophils Absolute: 0 10*3/uL (ref 0.0–0.1)
Basophils Absolute: 0 10*3/uL (ref 0.0–0.1)
Basophils Relative: 0 % (ref 0–1)
Basophils Relative: 0 % (ref 0–1)
Eosinophils Absolute: 0 10*3/uL (ref 0.0–0.7)
Eosinophils Absolute: 0.2 10*3/uL (ref 0.0–0.7)
Eosinophils Absolute: 0.1 10*3/uL (ref 0.0–0.7)
Eosinophils Relative: 2 % (ref 0–5)
Eosinophils Relative: 1 % (ref 0–5)
Lymphocytes Relative: 2 % — ABNORMAL LOW (ref 12–46)
Lymphocytes Relative: 15 % (ref 12–46)
Lymphs Abs: 0.3 10*3/uL — ABNORMAL LOW (ref 0.7–4.0)
Lymphs Abs: 1.4 10*3/uL (ref 0.7–4.0)
Monocytes Absolute: 1 10*3/uL (ref 0.1–1.0)
Monocytes Absolute: 1 10*3/uL (ref 0.1–1.0)
Monocytes Relative: 7 % (ref 3–12)
Monocytes Relative: 11 % (ref 3–12)
Neutro Abs: 6.9 10*3/uL (ref 1.7–7.7)
Neutrophils Relative %: 90 % — ABNORMAL HIGH (ref 43–77)
Neutrophils Relative %: 73 % (ref 43–77)

## 2010-08-09 LAB — CARDIAC PANEL(CRET KIN+CKTOT+MB+TROPI)
CK, MB: 2 ng/mL (ref 0.3–4.0)
Relative Index: INVALID (ref 0.0–2.5)
Total CK: 36 U/L (ref 7–232)
Total CK: 41 U/L (ref 7–232)
Troponin I: 0.09 ng/mL — ABNORMAL HIGH (ref 0.00–0.06)

## 2010-08-09 LAB — POCT I-STAT 3, VENOUS BLOOD GAS (G3P V)
O2 Saturation: 80 %
TCO2: 24 mmol/L (ref 0–100)
pCO2, Ven: 40.3 mmHg — ABNORMAL LOW (ref 45.0–50.0)
pH, Ven: 7.363 — ABNORMAL HIGH (ref 7.250–7.300)
pO2, Ven: 46 mmHg — ABNORMAL HIGH (ref 30.0–45.0)

## 2010-08-09 LAB — URINE CULTURE
Colony Count: 3000
Colony Count: NO GROWTH
Culture: NO GROWTH
Special Requests: NEGATIVE

## 2010-08-09 LAB — PROTIME-INR
INR: 1.41 (ref 0.00–1.49)
INR: 1.45 (ref 0.00–1.49)
INR: 1.67 — ABNORMAL HIGH (ref 0.00–1.49)
Prothrombin Time: 17.5 seconds — ABNORMAL HIGH (ref 11.6–15.2)
Prothrombin Time: 19.6 seconds — ABNORMAL HIGH (ref 11.6–15.2)

## 2010-08-09 LAB — POCT CARDIAC MARKERS
Myoglobin, poc: 137 ng/mL (ref 12–200)
Troponin i, poc: <0.05 ng/mL (ref 0.00–0.09)

## 2010-08-09 LAB — URINE MICROSCOPIC-ADD ON

## 2010-08-09 LAB — PREPARE RBC (CROSSMATCH)

## 2010-08-09 LAB — MAGNESIUM
Magnesium: 2.4 mg/dL (ref 1.5–2.5)
Magnesium: 2.5 mg/dL (ref 1.5–2.5)
Magnesium: 2.7 mg/dL — ABNORMAL HIGH (ref 1.5–2.5)

## 2010-08-09 LAB — TISSUE CULTURE
Culture: NO GROWTH
Gram Stain: NONE SEEN

## 2010-08-09 LAB — CULTURE, BLOOD (ROUTINE X 2): Culture: NO GROWTH

## 2010-08-09 LAB — HEPARIN LEVEL (UNFRACTIONATED): Heparin Unfractionated: 0.39 IU/mL (ref 0.30–0.70)

## 2010-08-09 LAB — DIC (DISSEMINATED INTRAVASCULAR COAGULATION)PANEL
D-Dimer, Quant: 2.65 ug/mL-FEU — ABNORMAL HIGH (ref 0.00–0.48)
Prothrombin Time: 21.4 seconds — ABNORMAL HIGH (ref 11.6–15.2)
Smear Review: NONE SEEN
aPTT: 49 seconds — ABNORMAL HIGH (ref 24–37)

## 2010-08-09 LAB — ABO/RH: ABO/RH(D): A POS

## 2010-08-09 LAB — APTT
aPTT: 34 seconds (ref 24–37)
aPTT: 39 seconds — ABNORMAL HIGH (ref 24–37)

## 2010-08-09 LAB — PLATELET COUNT: Platelets: 143 10*3/uL — ABNORMAL LOW (ref 150–400)

## 2010-08-09 LAB — TSH: TSH: 4.093 u[IU]/mL (ref 0.350–4.500)

## 2010-08-10 LAB — DIFFERENTIAL
Basophils Absolute: 0 10*3/uL (ref 0.0–0.1)
Eosinophils Absolute: 0 10*3/uL (ref 0.0–0.7)
Eosinophils Relative: 0 % (ref 0–5)
Lymphocytes Relative: 7 % — ABNORMAL LOW (ref 12–46)
Monocytes Absolute: 0.9 10*3/uL (ref 0.1–1.0)

## 2010-08-10 LAB — URINALYSIS, MICROSCOPIC ONLY
Ketones, ur: NEGATIVE mg/dL
Leukocytes, UA: NEGATIVE
Nitrite: NEGATIVE
Specific Gravity, Urine: 1.007 (ref 1.005–1.030)
Urobilinogen, UA: 0.2 mg/dL (ref 0.0–1.0)
pH: 6.5 (ref 5.0–8.0)

## 2010-08-10 LAB — CBC
HCT: 33.8 % — ABNORMAL LOW (ref 39.0–52.0)
HCT: 35.1 % — ABNORMAL LOW (ref 39.0–52.0)
Hemoglobin: 11.2 g/dL — ABNORMAL LOW (ref 13.0–17.0)
Hemoglobin: 11.8 g/dL — ABNORMAL LOW (ref 13.0–17.0)
Hemoglobin: 11.8 g/dL — ABNORMAL LOW (ref 13.0–17.0)
MCHC: 34.6 g/dL (ref 30.0–36.0)
MCHC: 34.7 g/dL (ref 30.0–36.0)
MCHC: 34.8 g/dL (ref 30.0–36.0)
MCHC: 34.8 g/dL (ref 30.0–36.0)
MCHC: 34.9 g/dL (ref 30.0–36.0)
MCV: 86.9 fL (ref 78.0–100.0)
MCV: 87.6 fL (ref 78.0–100.0)
Platelets: 142 10*3/uL — ABNORMAL LOW (ref 150–400)
Platelets: 150 10*3/uL (ref 150–400)
Platelets: 168 10*3/uL (ref 150–400)
Platelets: 176 10*3/uL (ref 150–400)
RBC: 3.58 MIL/uL — ABNORMAL LOW (ref 4.22–5.81)
RBC: 3.9 MIL/uL — ABNORMAL LOW (ref 4.22–5.81)
RBC: 3.91 MIL/uL — ABNORMAL LOW (ref 4.22–5.81)
RDW: 13.6 % (ref 11.5–15.5)
RDW: 13.7 % (ref 11.5–15.5)
RDW: 14 % (ref 11.5–15.5)
WBC: 9.6 10*3/uL (ref 4.0–10.5)

## 2010-08-10 LAB — BASIC METABOLIC PANEL
BUN: 13 mg/dL (ref 6–23)
BUN: 21 mg/dL (ref 6–23)
CO2: 23 mEq/L (ref 19–32)
CO2: 26 mEq/L (ref 19–32)
CO2: 27 mEq/L (ref 19–32)
Calcium: 9 mg/dL (ref 8.4–10.5)
Chloride: 102 mEq/L (ref 96–112)
Chloride: 104 mEq/L (ref 96–112)
Creatinine, Ser: 1.06 mg/dL (ref 0.4–1.5)
Creatinine, Ser: 1.13 mg/dL (ref 0.4–1.5)
GFR calc Af Amer: >60 mL/min (ref >60)
GFR calc non Af Amer: >60 mL/min (ref >60)
GFR calc non Af Amer: >60 mL/min (ref >60)
GFR calc non Af Amer: >60 mL/min (ref >60)
Glucose, Bld: 103 mg/dL — ABNORMAL HIGH (ref 70–99)
Glucose, Bld: 231 mg/dL — ABNORMAL HIGH (ref 70–99)
Glucose, Bld: 58 mg/dL — ABNORMAL LOW (ref 70–99)
Potassium: 4.1 mEq/L (ref 3.5–5.1)
Potassium: 4.2 mEq/L (ref 3.5–5.1)
Potassium: 4.3 mEq/L (ref 3.5–5.1)
Sodium: 134 mEq/L — ABNORMAL LOW (ref 135–145)
Sodium: 140 mEq/L (ref 135–145)

## 2010-08-10 LAB — GLUCOSE, CAPILLARY
Glucose-Capillary: 102 mg/dL — ABNORMAL HIGH (ref 70–99)
Glucose-Capillary: 103 mg/dL — ABNORMAL HIGH (ref 70–99)
Glucose-Capillary: 116 mg/dL — ABNORMAL HIGH (ref 70–99)
Glucose-Capillary: 149 mg/dL — ABNORMAL HIGH (ref 70–99)
Glucose-Capillary: 150 mg/dL — ABNORMAL HIGH (ref 70–99)
Glucose-Capillary: 154 mg/dL — ABNORMAL HIGH (ref 70–99)
Glucose-Capillary: 174 mg/dL — ABNORMAL HIGH (ref 70–99)
Glucose-Capillary: 180 mg/dL — ABNORMAL HIGH (ref 70–99)
Glucose-Capillary: 50 mg/dL — ABNORMAL LOW (ref 70–99)
Glucose-Capillary: 71 mg/dL (ref 70–99)
Glucose-Capillary: 74 mg/dL (ref 70–99)
Glucose-Capillary: 78 mg/dL (ref 70–99)
Glucose-Capillary: 81 mg/dL (ref 70–99)
Glucose-Capillary: 81 mg/dL (ref 70–99)

## 2010-08-10 LAB — CARDIAC PANEL(CRET KIN+CKTOT+MB+TROPI)
CK, MB: 3.9 ng/mL (ref 0.3–4.0)
Relative Index: INVALID (ref 0.0–2.5)
Relative Index: INVALID (ref 0.0–2.5)
Troponin I: 0.58 ng/mL (ref 0.00–0.06)

## 2010-08-10 LAB — COMPREHENSIVE METABOLIC PANEL
ALT: 13 U/L (ref 0–53)
AST: 16 U/L (ref 0–37)
Albumin: 2.9 g/dL — ABNORMAL LOW (ref 3.5–5.2)
Alkaline Phosphatase: 59 U/L (ref 39–117)
Chloride: 106 mEq/L (ref 96–112)
GFR calc Af Amer: >60 mL/min (ref >60)
Potassium: 4.3 mEq/L (ref 3.5–5.1)
Total Bilirubin: 0.5 mg/dL (ref 0.3–1.2)

## 2010-08-10 LAB — BLOOD GAS, ARTERIAL
Acid-base deficit: 0 mmol/L (ref 0.0–2.0)
Bicarbonate: 23.7 mEq/L (ref 20.0–24.0)
Drawn by: 30575
FIO2: 0.21 %
O2 Saturation: 95.4 %
Patient temperature: 98.6
TCO2: 24.8 mmol/L (ref 0–100)
pCO2 arterial: 36.2 mmHg (ref 35.0–45.0)
pH, Arterial: 7.432 (ref 7.350–7.450)
pO2, Arterial: 87.7 mmHg (ref 80.0–100.0)

## 2010-08-10 LAB — CK TOTAL AND CKMB (NOT AT ARMC)
CK, MB: 5.5 ng/mL — ABNORMAL HIGH (ref 0.3–4.0)
Relative Index: INVALID (ref 0.0–2.5)
Total CK: 99 U/L (ref 7–232)

## 2010-08-10 LAB — BRAIN NATRIURETIC PEPTIDE: Pro B Natriuretic peptide (BNP): 292 pg/mL — ABNORMAL HIGH (ref 0.0–100.0)

## 2010-08-10 LAB — HEMOGLOBIN A1C: Hgb A1c MFr Bld: 7.2 % — ABNORMAL HIGH (ref 4.6–6.1)

## 2010-08-10 LAB — HEPARIN LEVEL (UNFRACTIONATED)
Heparin Unfractionated: 0.4 IU/mL (ref 0.30–0.70)
Heparin Unfractionated: 0.44 IU/mL (ref 0.30–0.70)
Heparin Unfractionated: 0.47 IU/mL (ref 0.30–0.70)

## 2010-08-10 LAB — LIPID PANEL: VLDL: 13 mg/dL (ref 0–40)

## 2010-08-10 LAB — TSH: TSH: 3.365 u[IU]/mL (ref 0.350–4.500)

## 2010-08-10 LAB — MAGNESIUM: Magnesium: 2.1 mg/dL (ref 1.5–2.5)

## 2010-08-10 LAB — TROPONIN I: Troponin I: 0.27 ng/mL — ABNORMAL HIGH (ref 0.00–0.06)

## 2010-08-12 LAB — GLUCOSE, CAPILLARY
Glucose-Capillary: 118 mg/dL — ABNORMAL HIGH (ref 70–99)
Glucose-Capillary: 127 mg/dL — ABNORMAL HIGH (ref 70–99)
Glucose-Capillary: 137 mg/dL — ABNORMAL HIGH (ref 70–99)
Glucose-Capillary: 141 mg/dL — ABNORMAL HIGH (ref 70–99)
Glucose-Capillary: 165 mg/dL — ABNORMAL HIGH (ref 70–99)
Glucose-Capillary: 189 mg/dL — ABNORMAL HIGH (ref 70–99)
Glucose-Capillary: 191 mg/dL — ABNORMAL HIGH (ref 70–99)
Glucose-Capillary: 192 mg/dL — ABNORMAL HIGH (ref 70–99)
Glucose-Capillary: 213 mg/dL — ABNORMAL HIGH (ref 70–99)
Glucose-Capillary: 227 mg/dL — ABNORMAL HIGH (ref 70–99)
Glucose-Capillary: 248 mg/dL — ABNORMAL HIGH (ref 70–99)
Glucose-Capillary: 249 mg/dL — ABNORMAL HIGH (ref 70–99)

## 2010-08-12 LAB — C-REACTIVE PROTEIN: CRP: 9.1 mg/dL — ABNORMAL HIGH (ref <0.6)

## 2010-08-12 LAB — CBC
HCT: 37 % — ABNORMAL LOW (ref 39.0–52.0)
HCT: 37.6 % — ABNORMAL LOW (ref 39.0–52.0)
Hemoglobin: 11.8 g/dL — ABNORMAL LOW (ref 13.0–17.0)
Hemoglobin: 12.1 g/dL — ABNORMAL LOW (ref 13.0–17.0)
Hemoglobin: 12.9 g/dL — ABNORMAL LOW (ref 13.0–17.0)
MCHC: 34 g/dL (ref 30.0–36.0)
MCHC: 34.4 g/dL (ref 30.0–36.0)
MCHC: 34.8 g/dL (ref 30.0–36.0)
MCV: 84.5 fL (ref 78.0–100.0)
Platelets: 134 10*3/uL — ABNORMAL LOW (ref 150–400)
Platelets: 135 10*3/uL — ABNORMAL LOW (ref 150–400)
Platelets: 155 10*3/uL (ref 150–400)
Platelets: 166 10*3/uL (ref 150–400)
Platelets: 207 10*3/uL (ref 150–400)
RBC: 4.4 MIL/uL (ref 4.22–5.81)
RBC: 4.5 MIL/uL (ref 4.22–5.81)
RDW: 12.3 % (ref 11.5–15.5)
RDW: 12.5 % (ref 11.5–15.5)
RDW: 12.8 % (ref 11.5–15.5)
RDW: 12.9 % (ref 11.5–15.5)
WBC: 10.1 10*3/uL (ref 4.0–10.5)
WBC: 12.8 10*3/uL — ABNORMAL HIGH (ref 4.0–10.5)
WBC: 9.4 10*3/uL (ref 4.0–10.5)

## 2010-08-12 LAB — SEDIMENTATION RATE: Sed Rate: 45 mm/hr — ABNORMAL HIGH (ref 0–16)

## 2010-08-12 LAB — WOUND CULTURE

## 2010-08-12 LAB — DIFFERENTIAL
Basophils Absolute: 0 10*3/uL (ref 0.0–0.1)
Basophils Absolute: 0 10*3/uL (ref 0.0–0.1)
Basophils Relative: 0 % (ref 0–1)
Basophils Relative: 0 % (ref 0–1)
Basophils Relative: 0 % (ref 0–1)
Basophils Relative: 0 % (ref 0–1)
Basophils Relative: 0 % (ref 0–1)
Eosinophils Absolute: 0 10*3/uL (ref 0.0–0.7)
Eosinophils Absolute: 0.1 10*3/uL (ref 0.0–0.7)
Eosinophils Absolute: 0.2 10*3/uL (ref 0.0–0.7)
Eosinophils Relative: 1 % (ref 0–5)
Eosinophils Relative: 2 % (ref 0–5)
Lymphocytes Relative: 18 % (ref 12–46)
Lymphocytes Relative: 7 % — ABNORMAL LOW (ref 12–46)
Lymphs Abs: 1.1 10*3/uL (ref 0.7–4.0)
Lymphs Abs: 1.2 10*3/uL (ref 0.7–4.0)
Lymphs Abs: 1.8 10*3/uL (ref 0.7–4.0)
Monocytes Absolute: 1.3 10*3/uL — ABNORMAL HIGH (ref 0.1–1.0)
Monocytes Absolute: 1.3 10*3/uL — ABNORMAL HIGH (ref 0.1–1.0)
Monocytes Absolute: 1.4 10*3/uL — ABNORMAL HIGH (ref 0.1–1.0)
Monocytes Relative: 11 % (ref 3–12)
Monocytes Relative: 12 % (ref 3–12)
Monocytes Relative: 8 % (ref 3–12)
Neutro Abs: 6.8 10*3/uL (ref 1.7–7.7)
Neutro Abs: 9.7 10*3/uL — ABNORMAL HIGH (ref 1.7–7.7)
Neutrophils Relative %: 72 % (ref 43–77)
Neutrophils Relative %: 76 % (ref 43–77)
Neutrophils Relative %: 77 % (ref 43–77)
Neutrophils Relative %: 80 % — ABNORMAL HIGH (ref 43–77)
Neutrophils Relative %: 83 % — ABNORMAL HIGH (ref 43–77)

## 2010-08-12 LAB — COMPREHENSIVE METABOLIC PANEL
ALT: 13 U/L (ref 0–53)
Alkaline Phosphatase: 54 U/L (ref 39–117)
Chloride: 100 mEq/L (ref 96–112)
Glucose, Bld: 145 mg/dL — ABNORMAL HIGH (ref 70–99)
Potassium: 4 mEq/L (ref 3.5–5.1)
Sodium: 131 mEq/L — ABNORMAL LOW (ref 135–145)
Total Protein: 6.4 g/dL (ref 6.0–8.3)

## 2010-08-12 LAB — LIPID PANEL
Cholesterol: 75 mg/dL (ref 0–200)
HDL: 21 mg/dL — ABNORMAL LOW (ref >39)
Triglycerides: 54 mg/dL (ref <150)

## 2010-08-12 LAB — HEMOGLOBIN A1C: Hgb A1c MFr Bld: 7 % — ABNORMAL HIGH (ref 4.6–6.1)

## 2010-08-14 LAB — COMPREHENSIVE METABOLIC PANEL
ALT: 16 U/L (ref 0–53)
AST: 28 U/L (ref 0–37)
Alkaline Phosphatase: 63 U/L (ref 39–117)
CO2: 27 mEq/L (ref 19–32)
Chloride: 104 mEq/L (ref 96–112)
GFR calc Af Amer: >60 mL/min (ref >60)
GFR calc non Af Amer: >60 mL/min (ref >60)
Potassium: 4.6 mEq/L (ref 3.5–5.1)
Sodium: 136 mEq/L (ref 135–145)
Total Bilirubin: 0.5 mg/dL (ref 0.3–1.2)

## 2010-08-14 LAB — CBC
HCT: 38.2 % — ABNORMAL LOW (ref 39.0–52.0)
HCT: 39.1 % (ref 39.0–52.0)
Hemoglobin: 13.6 g/dL (ref 13.0–17.0)
Hemoglobin: 14.8 g/dL (ref 13.0–17.0)
MCV: 84.9 fL (ref 78.0–100.0)
MCV: 86.3 fL (ref 78.0–100.0)
MCV: 86.6 fL (ref 78.0–100.0)
Platelets: 162 10*3/uL (ref 150–400)
Platelets: 180 10*3/uL (ref 150–400)
RBC: 4.51 MIL/uL (ref 4.22–5.81)
RBC: 4.97 MIL/uL (ref 4.22–5.81)
RDW: 12.6 % (ref 11.5–15.5)
WBC: 10.6 10*3/uL — ABNORMAL HIGH (ref 4.0–10.5)
WBC: 13.5 10*3/uL — ABNORMAL HIGH (ref 4.0–10.5)
WBC: 9.7 10*3/uL (ref 4.0–10.5)

## 2010-08-14 LAB — GLUCOSE, CAPILLARY
Glucose-Capillary: 127 mg/dL — ABNORMAL HIGH (ref 70–99)
Glucose-Capillary: 130 mg/dL — ABNORMAL HIGH (ref 70–99)
Glucose-Capillary: 148 mg/dL — ABNORMAL HIGH (ref 70–99)
Glucose-Capillary: 176 mg/dL — ABNORMAL HIGH (ref 70–99)
Glucose-Capillary: 203 mg/dL — ABNORMAL HIGH (ref 70–99)
Glucose-Capillary: 218 mg/dL — ABNORMAL HIGH (ref 70–99)
Glucose-Capillary: 221 mg/dL — ABNORMAL HIGH (ref 70–99)
Glucose-Capillary: 95 mg/dL (ref 70–99)
Glucose-Capillary: 97 mg/dL (ref 70–99)

## 2010-08-14 LAB — WOUND CULTURE

## 2010-08-14 LAB — BASIC METABOLIC PANEL
BUN: 10 mg/dL (ref 6–23)
BUN: 11 mg/dL (ref 6–23)
Calcium: 8.6 mg/dL (ref 8.4–10.5)
Chloride: 107 mEq/L (ref 96–112)
Chloride: 110 mEq/L (ref 96–112)
Creatinine, Ser: 0.9 mg/dL (ref 0.4–1.5)
Creatinine, Ser: 1.01 mg/dL (ref 0.4–1.5)
GFR calc Af Amer: >60 mL/min (ref >60)
GFR calc Af Amer: >60 mL/min (ref >60)
GFR calc non Af Amer: >60 mL/min (ref >60)
Glucose, Bld: 109 mg/dL — ABNORMAL HIGH (ref 70–99)
Potassium: 4.2 mEq/L (ref 3.5–5.1)
Potassium: 4.6 mEq/L (ref 3.5–5.1)

## 2010-08-14 LAB — DIFFERENTIAL
Basophils Relative: 0 % (ref 0–1)
Eosinophils Absolute: 0.1 10*3/uL (ref 0.0–0.7)
Eosinophils Relative: 1 % (ref 0–5)
Lymphs Abs: 1.4 10*3/uL (ref 0.7–4.0)

## 2010-08-14 LAB — SEDIMENTATION RATE: Sed Rate: 54 mm/hr — ABNORMAL HIGH (ref 0–16)

## 2010-08-14 LAB — CULTURE, BLOOD (ROUTINE X 2)
Culture: NO GROWTH
Culture: NO GROWTH

## 2010-08-30 ENCOUNTER — Observation Stay (HOSPITAL_COMMUNITY)
Admission: RE | Admit: 2010-08-30 | Discharge: 2010-08-31 | Disposition: A | Discharge status: Home / Self Care | Payer: BC Managed Care – PPO | Source: Ambulatory Visit | Attending: Orthopedic Surgery | Admitting: Orthopedic Surgery

## 2010-08-30 DIAGNOSIS — K219 Gastro-esophageal reflux disease without esophagitis: Secondary | ICD-10-CM | POA: Insufficient documentation

## 2010-08-30 DIAGNOSIS — M86679 Other chronic osteomyelitis, unspecified ankle and foot: Secondary | ICD-10-CM | POA: Insufficient documentation

## 2010-08-30 DIAGNOSIS — I2510 Atherosclerotic heart disease of native coronary artery without angina pectoris: Secondary | ICD-10-CM | POA: Insufficient documentation

## 2010-08-30 DIAGNOSIS — M9080 Osteopathy in diseases classified elsewhere, unspecified site: Secondary | ICD-10-CM | POA: Insufficient documentation

## 2010-08-30 DIAGNOSIS — I872 Venous insufficiency (chronic) (peripheral): Secondary | ICD-10-CM | POA: Insufficient documentation

## 2010-08-30 DIAGNOSIS — E1149 Type 2 diabetes mellitus with other diabetic neurological complication: Secondary | ICD-10-CM | POA: Insufficient documentation

## 2010-08-30 DIAGNOSIS — L97809 Non-pressure chronic ulcer of other part of unspecified lower leg with unspecified severity: Secondary | ICD-10-CM | POA: Insufficient documentation

## 2010-08-30 DIAGNOSIS — I10 Essential (primary) hypertension: Secondary | ICD-10-CM | POA: Insufficient documentation

## 2010-08-30 DIAGNOSIS — E1169 Type 2 diabetes mellitus with other specified complication: Principal | ICD-10-CM | POA: Insufficient documentation

## 2010-08-30 DIAGNOSIS — E1142 Type 2 diabetes mellitus with diabetic polyneuropathy: Secondary | ICD-10-CM | POA: Insufficient documentation

## 2010-08-30 LAB — COMPREHENSIVE METABOLIC PANEL
AST: 21 U/L (ref 0–37)
Albumin: 3.7 g/dL (ref 3.5–5.2)
Alkaline Phosphatase: 90 U/L (ref 39–117)
BUN: 27 mg/dL — ABNORMAL HIGH (ref 6–23)
CO2: 30 mEq/L (ref 19–32)
Chloride: 102 mEq/L (ref 96–112)
Potassium: 5.8 mEq/L — ABNORMAL HIGH (ref 3.5–5.1)
Total Bilirubin: 0.4 mg/dL (ref 0.3–1.2)

## 2010-08-30 LAB — CBC
Hemoglobin: 11.7 g/dL — ABNORMAL LOW (ref 13.0–17.0)
MCV: 81.5 fL (ref 78.0–100.0)
Platelets: 137 10*3/uL — ABNORMAL LOW (ref 150–400)
RBC: 4.32 MIL/uL (ref 4.22–5.81)
WBC: 10.1 10*3/uL (ref 4.0–10.5)

## 2010-08-30 LAB — GLUCOSE, CAPILLARY: Glucose-Capillary: 142 mg/dL — ABNORMAL HIGH (ref 70–99)

## 2010-08-30 LAB — PROTIME-INR: Prothrombin Time: 13.9 seconds (ref 11.6–15.2)

## 2010-08-31 LAB — GLUCOSE, CAPILLARY
Glucose-Capillary: 112 mg/dL — ABNORMAL HIGH (ref 70–99)
Glucose-Capillary: 149 mg/dL — ABNORMAL HIGH (ref 70–99)

## 2010-09-05 NOTE — Op Note (Signed)
NAMEARISTON, MOERS               ACCOUNT NO.:  [id number]  MEDICAL RECORD NO.:  [id number]           PATIENT TYPE:  O  LOCATION:  5002                         FACILITY:  [facility]  PHYSICIAN:  Newt Minion, MD     DATE OF BIRTH:  [birth year]  DATE OF PROCEDURE:  08/30/2010 DATE OF DISCHARGE:                              OPERATIVE REPORT   PREOPERATIVE DIAGNOSES: 1. Osteomyelitis abscess ulceration, left great toe. 2. Venous stasis ulceration, left leg ulcer 10 x 10 cm.  POSTOPERATIVE DIAGNOSES: 1. Osteomyelitis abscess ulceration, left great toe. 2. Venous stasis ulceration, left leg ulcer 10 x 10 cm.  PROCEDURE: 1. Left foot first ray amputation. 2. Application of ACell xenograft tissue graft to the surgical     incision as well as to the venous stasis ulcer.  After debridement,     ulcer size 10 x 10 cm over the anterior aspect of the tibia.  SURGEON:  Newt Minion, MD  ANESTHESIA:  General.  ESTIMATED BLOOD LOSS:  Minimal.ANTIBIOTICS:  Kefzol 2 g.  DRAINS:  None.  COMPLICATIONS:  None.  TOURNIQUET TIME:  None.  DISPOSITION:  To PACU in stable condition.  INDICATIONS FOR PROCEDURE:  The patient is a 55 year old gentleman diabetic with neuropathy with chronic ulceration beneath the MTP joint of the great toe.  The patient has developed progressive lytic changes in the bone consistent with chronic osteomyelitis due to failure of conservative treatment with pressure unloading and wound care.  The patient presents at this time for amputation of the great toe.  Risks and benefits were discussed including infection, neurovascular injury, persistent pain, need for additional surgery.  The patient states he understands and wish to proceed at this time.  DESCRIPTION OF PROCEDURE:  The patient was brought to OR room 10 and underwent a general anesthetic.  After adequate level of the anesthesia was obtained, the patient's left lower extremity was prepped using DuraPrep  and draped into a sterile field.  Attention was first focused on the foot.  A racket incision was made around the first ray to include the ulcer.  The first metatarsal and great toe were resected in one block of tissue including the ulcerative tissue.  The wound was irrigated with normal saline.  There was good petechial bleeding. Electrocautery was used for hemostasis.  Wound was irrigated with normal saline.  The incision was closed using 2-0 nylon without any tension on the skin.  Wound was covered with Mepilex after application of ACell powder within the wound and ACell three-layered graft over the superficial layer of the incision.  Attention was then focused on the tibia.  The patient underwent debridement of the massive venous stasis ulcer over the anterior aspect of the tibia.  The ulcer measures 10 x 10 cm.  The wound after debridement had good granulating tissue base.  The ACell was fenestrated on the back table, and the 7- x 10-cm graft plus graft tissue from the first graft was applied over the tibial crest wound.  This was also covered with Mepitel, 4 x 4's, Kerlix, and Coban compressive wrap.  The patient was extubated,  taken to PACU in stable condition.  Plan for overnight observation.  Change the dressing in a week     Newt Minion, MD     MVD/MEDQ  D:  08/30/2010  T:  08/31/2010  Job:  [id number]  Electronically Signed by Meridee Score MD on 09/05/2010 04:29:29 PM

## 2010-09-19 NOTE — Assessment & Plan Note (Signed)
OFFICE VISIT   [name], [name]  DOB:  04-27-1956                                        July 13, 2009  CHART #:  [id number]   CURRENT PROBLEMS:  1. Status post left Pleurx catheter for recurrent pleural effusion      status post AVR/CABG, November 2010.  2. Diabetes mellitus.  3. Deep sternal wound infection treated with debridement and      pectoralis flaps by Dr. Romana Juniper, December 2010   PRESENT ILLNESS:  The patient is a 55 year old Caucasian male returns  for a wound check.  His Pleurx catheter was removed in the OR last week.  He is back at work full-time, and he has no symptoms of CHF.  The Pleurx  catheter site has healed.   PHYSICAL EXAMINATION:  Vital Signs:  His blood pressure is 120/70, pulse  is 80, respirations 18, saturation 98% on room air.  His breath sounds  are clear bilaterally.  He has a dressing over his sternal incision.  Pleurx catheter site on the left anterior chest is healing.  The sutures  are removed and a small Neosporin dressing was applied.   PA and lateral chest x-ray shows some slight increase in left pleural  effusion.  However, this is minimal.  Overall, lungs are well expanded  and aerated.   IMPRESSION AND PLAN:  The patient will continue his current medicines  and activities and that includes his full-time work for the [employer].  I plan on seeing him back in 3 weeks with a chest x-ray to  make sure his pleural effusion is now slowly reaccumulate.   Ivin Poot, M.D.  Electronically Signed   PV/MEDQ  D:  07/13/2009  T:  07/14/2009  Job:  [id number]

## 2010-09-19 NOTE — Assessment & Plan Note (Signed)
OFFICE VISIT   DARRYL, ARNHOLD  DOB:  Aug 22, 1955                                        June 22, 2009  CHART #:  [id number]   CURRENT PROBLEMS:  1. Status post coronary artery bypass graft, aortic valve replacement      November 2010.  2. Deep sternal infection treated with pectoralis flaps by Dr. Denese Killings      December 2010.  3. Recurrent left pleural effusion treated with left Pleurx catheter      placed April 20, 2009.   PRESENT ILLNESS:  The patient is a 55 year old Caucasian diabetic, who  is still recovering from multivessel bypass grafting and aortic valve  replacement with bioprosthetic valve in November for severe coronary  disease and moderate aortic stenosis.  He developed a deep sternal  infection requiring debridement and pectoralis muscle flaps and is now  recovering from that.  He still follows by Dr. Denese Killings for a superficial  wound nonhealing, which is clean.  I am still following the patient for  a left Pleurx catheter, which was placed for recurrent left pleural  effusion related to his diastolic dysfunction and mild LV dysfunction.   The patient is doing well at home.  Pleurx catheter is being drained  once weekly in our office.  It has drained 300 mL the last 3 sessions  over the past 4 weeks.  He denies symptoms of CHF.  The sternal incision  is granulating in.  He has no fever.  His blood sugars are fairly well  controlled.   PHYSICAL EXAMINATION:  Vital Signs:  Blood pressure is 150/80, pulse  100, respirations 18, and saturation 94% on room air.  General:  He is  alert and pleasant.  Lungs:  Breath sounds are clear.  Cardiac:  Rhythm  is regular.  Chest:  The sternal wound has some eschar superficially,  which is debrided and the wound is cleaned with peroxide saline and wet-  to-dry dressing is applied.  Extremities:  He has minimal peripheral  edema.   A PA and lateral chest x-ray shows some mild interstitial edema,  no  significant pleural effusion.  The Pleurx catheter is in good position.   The patient is doing well now and is almost recovered from his 2 major  operations.  I told him he could plan on returning to work on July 04, 2009.  I will see him back in 1 week and probably remove the Pleurx  catheter if the drainage continues to be less than 30-40 mL per day.  I  have told him he should continue with the amiodarone.  He had  postoperative transient atrial fibrillation, but not to refill the  prescription.  I told him to stop taking the folate and iron, which have  run out from his original discharge prescription.  He needs to continue  on Lasix and potassium, we discussed that.   PLAN:  The patient will return in 1 week for Pleurx drainage and  possibly to schedule Pleurx catheter removal.   Ivin Poot, M.D.  Electronically Signed   PV/MEDQ  D:  06/22/2009  T:  06/22/2009  Job:  [id number]   cc:   Minus Breeding, MD, [facility]  Leda Quail, MD

## 2010-09-19 NOTE — Letter (Signed)
September 02, 2009    Hermelinda Dellen, D.P.M., F.A.C.S.  [address]  [place], [place] [address]   RE:  [name], [name]  MRN:  [id number]  /  DOB:  Jul 06, 1955   Dear Milinda Pointer:   This letter concerns [name], who is under my care for  coronary artery disease.  I received your letter with a description of  the planned procedure.  He would be at acceptable risk for this  procedure without further cardiovascular testing.  If you have any  further specific questions about him, please do not hesitate to call me  at this cell phone [phone].   Thank you for your help in caring for this gentleman.    Sincerely,      Minus Breeding, MD, [facility]  Electronically Signed    JH/MedQ  DD: 09/02/2009  DT: 09/02/2009  Job #: [id number]

## 2010-09-19 NOTE — Op Note (Signed)
[name], [name]               ACCOUNT NO.:  [id number]   MEDICAL RECORD NO.:  [id number]          PATIENT TYPE:  INP   LOCATION:  5014                         FACILITY:  [facility]   PHYSICIAN:  Lind Guest. Ninfa Linden, M.D.DATE OF BIRTH:  Jun 05, 1955   DATE OF PROCEDURE:  12/10/2008  DATE OF DISCHARGE:                               OPERATIVE REPORT   PREOPERATIVE DIAGNOSIS:  Recurrent right foot first ray infection with  retained hardware.   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   PROCEDURES:  1. Irrigation and debridement of right foot first ray with removal of      necrotic tissue.  2. Removal of deep buried pins and wires, right foot first ray.   SURGEON:  Lind Guest. Ninfa Linden, MD   ANESTHESIA:  General.   BLOOD LOSS:  Less than 100 mL.   COMPLICATIONS:  None.   TOURNIQUET TIME:  Less than 30 minutes.   INDICATIONS:  [name] is a 55 year old gentleman who had a grade 2  surgery by podiatrist in town earlier this year.  He developed an  infection that may have been related to his hardware, but it was  difficult to tell because it was also on the plantar surface of the foot  and hardware was put in dorsally.  Several weeks ago, he underwent an  irrigation and debridement with removal of one of the sesamoids.  He  continued to present with signs and symptoms of infection and then came  back to the hospital and was admitted to the Medicine Service with  continued cellulitis of his foot.  I talked to him at length and made a  decision to open his foot to both plantarly as well as dorsally and  removed the hardware as well.  The risks and benefits of this were  explained to him and he understood the need to proceed with surgery.   PROCEDURE IN DETAIL:  After informed consent was obtained and  appropriate right foot was marked, he was brought to the operating room  and placed supine on the operating table.  General  anesthesia was  obtained.  A nonsterile tourniquet was placed around his upper leg and  his right foot was prepped and draped with DuraPrep and sterile drapes.  A time-out was called to identify the correct patient and correct right  foot.  I then had the leg elevated and the tourniquet was elevated to  350 mL of pressure.  A small plantar wound was excised and extended  proximally and distally.  There was gross purulence along the plantar  surface of the first ray.  I was able to remove the both sesamoids in  their entirety.  At this point, I still was encountering pus, I think  that some of this was coming from his previous dorsal hardware.  After  thorough pulsatile lavage using 3 liters of normal saline solution, I  made a dorsal incision and dissected down and removed two deep pins as  well as the retained wire.  I then copiously irrigated both wounds again  with normal saline solution using pulsatile lavage after the tourniquet  was let down.  There was good bleeding tissue.  I then closed the deep  tissue on the plantar surface of the foot with 2-0 PDS followed by 2-0  nylon on the skin, interrupted for wound in an interrupted format and  then 2-0 nylon on the dorsal  incision with interrupted format.  Xeroform followed by well-padded  sterile dressing was applied to this foot.  He was awakened, extubated,  and taken to recovery room in stable condition.  Postoperatively, I will  have him stay on antibiotics for awhile and will keep him  nonweightbearing on his foot until better healing is encountered.      Lind Guest. Ninfa Linden, M.D.  Electronically Signed     CYB/MEDQ  D:  12/10/2008  T:  12/11/2008  Job:  [id number]

## 2010-09-19 NOTE — Assessment & Plan Note (Signed)
OFFICE VISIT   Raymond, King  DOB:  [birth year]                                        June 15, 2009  CHART #:  [id number]   HISTORY:  The patient comes in today for a 1-week follow up and drainage  of his Pleurx catheter.  He is status post coronary artery bypass  grafting and aortic valve replacement on March 14, 2009.  He had a  complicated postoperative course which included recurrent bilateral  pleural effusions requiring placement of Pleurx catheters as well as a  sternal wound infection which ultimately resulted in bilateral  pectoralis muscle flaps by Dr. Denese Killings.  He has not had his Pleurx  catheter drained since this last office visit, at which time they were  able to obtain 300 mL of serous fluid.  Since that visit, he denies any  fevers, chills, shortness of breath, or chest discomfort.  He is still  easily fatigued but has tried to progress with his daily activities.  He  continues to do wet-to-dry dressing changes to his sternal wound per Dr.  Denese Killings and actually has an appointment with Dr. Denese Killings this week for  recheck.  He has been released from home health.   PHYSICAL EXAMINATION:  Vital Signs:  Blood pressure 125/70, pulse is 94,  respirations 16, O2 sat 97% on room air.  Chest:  His sternal wound is  examined by Dr. Prescott Gum and some excisional debridement is done at the  bedside.  The area is cleaned with peroxide and dressed with Neosporin  and saline wet-to-dry dressing.  There is some necrotic tissue on the  periphery of the wound.  Once this was debrided away, there appears to  be a good granulation tissue and the wound itself is very shallow.  His  leg wound has healed well.  Heart:  Regular rate and rhythm without  murmurs, rubs, or gallops.  Lungs:  Slightly diminished in the bases but  overall clear.  Pleurx catheter remains in place on the left.  The  suture is removed and the area is cleaned today.  We are able to drain  350 mL of serous drainage from his Pleurx today.  Extremities:  Lower  extremities are without significant edema.   DIAGNOSTIC TESTS:  Chest x-ray is stable with small bilateral pleural  effusions.   ASSESSMENT AND PLAN:  The patient continues to progress well from  coronary artery bypass graft, aortic valve replacement, and bilateral  pectoralis muscle flaps.  Since he continues to drain a moderate amount  from his Pleurx catheter, we will plan on leaving it in for now.  We  will have him return for followup in 1 week with a chest x-ray, and we  will drain him again at that time to see if we may be able to remove his  catheter.  Also, he is to follow up as directed with Dr. Denese Killings who is  to continue wet-to-dry dressing changes as directed.   Ivin Poot, M.D.  Electronically Signed   GC/MEDQ  D:  06/15/2009  T:  06/16/2009  Job:  [id number]   cc:   Minus Breeding, MD, [facility]  Leda Quail, MD

## 2010-09-19 NOTE — Assessment & Plan Note (Signed)
OFFICE VISIT   DUBOIS, EICHMANN  DOB:  [birth year]                                        June 06, 2009  CHART #:  [id number]   HISTORY:  The patient is seen on today's date and further follow up  following his coronary artery bypass grafting and aortic valve  replacement done on March 14, 2009.  He has had a complex  postoperative hospital course including pleural effusions and sternal  wound infection that ultimately required bilateral pectoralis muscle  flaps as well as placement of a left Pleurx catheter.  He continues to  be followed by Dr. Denese Killings for the flaps.  Currently, he denies fevers,  chills, or other constitutional symptoms.  He is doing daily dressing  changes as the wound is still open in the midportion, although notably  not very deep.  In regards to the Pleurx catheter, most recently he has  stopped every other day drainage and currently is at once weekly.  On  June 02, 2009, 150 mL were drained.   DIAGNOSTIC TESTS:  Chest x-ray was obtained on today's date and reveals  a small left greater than right-sided bilateral effusion and mild  bibasilar atelectasis.   PHYSICAL EXAMINATION:  Vital Signs:  Blood pressure 124/71, pulse 99,  respirations 20, oxygen saturation is 96% on room air.  Chest:  The  sternal wound is inspected.  There is a superficial separation in the  midportion of the wound with a greenish exudate.  There is no  surrounding erythema.  Cardiac:  Regular rate and rhythm.  Pulmonary:  Slightly diminished breath sounds in the basis.   PROCEDURE:  The left Pleurx catheter was drained, 300 mL of serous straw-  colored fluid.   ASSESSMENT:  The patient continues to make progress in his recovery  following coronary artery bypass grafting and aortic valve replacement.  His effusion does appear to be decreasing over time, although still 300  mL drained between June 02, 2009, and June 06, 2009.  He has a  scheduled  appointment to see Dr. Prescott Gum on June 15, 2009, and we  will evaluate it again for possible consideration of removal of the  Pleurx at that time.  He is to continue his ongoing wound management  through Dr. Susy Frizzle office and ongoing cardiology management through  Dr. Percival Spanish.  We discussed returning to work.  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  I told him I felt it was okay  for him to do office type, attend meetings, and work at his desk, but  probably was not advisable to be out in the work environment with  possible contaminated water issues.  He agrees with this.  I also  discussed with him trying to make arrangements through Cardiology to  begin cardiac rehabilitation as that is probably feasible at this time  and he does have still moderate weakness due to debilitation.   John Giovanni, P.A.-C.   Loren Racer  D:  06/06/2009  T:  06/07/2009  Job:  [id number]   cc:   Leda Quail, MD  Minus Breeding, MD, [facility]

## 2010-09-19 NOTE — H&P (Signed)
NAMECRISTOBAL, SCELFO               ACCOUNT NO.:  [id number]   MEDICAL RECORD NO.:  [id number]          PATIENT TYPE:  INP   LOCATION:  5014                         FACILITY:  [facility]   PHYSICIAN:  Orson Slick, MD     DATE OF BIRTH:  August 23, 1955   DATE OF ADMISSION:  12/08/2008  DATE OF DISCHARGE:                              HISTORY & PHYSICAL   REASON FOR ADMISSION:  Diabetic foot ulcer.   HISTORY OF PRESENT ILLNESS:  Mr. Crawmer is a 55 year old white man with  a past medical history of diabetes and right foot surgery, who presented  to the ER for worsening purulent drainage from a foot ulcer that he has  been treated for.  The patient first began having problems with that  foot in February and was initiated on a short-course antibiotics at that  time.  He was readmitted to the hospital on November 06, 2008, and stayed  here until November 09, 2008, for IV antibiotics.  Orthopedic consult at that  time did evaluate the foot, and did an incision and drainage of it with  a wash out in the operative room.  MRI showed evidence of osteomyelitis;  however, it was determined that conservative management could be  pursued.  The patient was given a prescription for doxycycline 100 mg  p.o. b.i.d. x14 days and Cipro 500 mg p.o. b.i.d. x14 days, which he  took at home.  Cultures grew out methicillin-sensitive staph aureus as  well as Acinetobacter and Bacteroides, which were sensitive to these  medications.  The patient completed his course of antibiotics and states  initially the foot did improve.  He followed up with Orthopedics  approximately 2 weeks ago and states that he began to have some  recurrent drainage from the foot after the stitches were removed at that  time.  The foot has gotten progressively more swollen and erythematous  with swelling up to his right knee.  He also states the amount of  drainage has continued to worsen and the foul smell returned over this  past weekend.  The patient  went to his primary care physician on Monday,  who did take a culture of the wound and was concerned the patient will  need to go back on antibiotics, but held off while awaiting for culture  results.  The patient denies any fever or other systemic signs of  infection.   REVIEW OF SYSTEMS:  No chest pain, dyspnea, nausea, vomiting, diarrhea,  or constipation.  All 14 systems reviewed in detail and are negative  except as described in the HPI.   PAST MEDICAL HISTORY:  1. Diabetes.  2. Hyperlipidemia.  3. Hypertension.  4. GERD.  5. Diabetic foot wound as above.   SOCIAL HISTORY:  No tobacco, alcohol, or drug use.   MEDICATIONS:  1. Glipizide 10 mg p.o. b.i.d.  2. Januvia 100 mg p.o. daily.  3. Lantus insulin 55 units daily.  4. Nexium 40 mg daily.  5. Amlodipine 5 mg daily.  6. Aspirin 325 mg daily.  7. Unknown cholesterol medication.  The patient believes it is  simvastatin 10 mg daily.   ALLERGIES:  No known drug allergies.   PHYSICAL EXAMINATION:  VITAL SIGNS:  Temperature 101.1, pulse 123,  respiratory rate 18, blood pressure 157/80, O2 sats 97% on room air.  GENERAL:  A pleasant white man, alert and oriented x3, in no acute  distress.  HEENT:  Oropharynx clear.  Mucous membranes moist.  NECK:  Normal JVP.  CARDIOVASCULAR:  A 2/6 systolic ejection murmur present over the left  upper sternal border.  Regular rate and rhythm.  Normal S1 and S2.  LUNGS:  Clear to auscultation bilaterally.  ABDOMEN:  Soft, nontender, nondistended.  Bowel sounds normoactive x4.  EXTREMITIES:  Right lower extremity with 2+ pitting edema up to the  knee, erythema to the right ankle.  There is a 2-cm ulcer on the plantar  surface of the first MTP joint that is draining purulent and frothy  fluid with a foul smell.  The left lower extremity is normal.  NEUROLOGIC:  No gross deficits.  Pulses are 1+ DP pulses bilaterally.   LABORATORY DATA:  CBC with diff has a white count of 12.8 with  83%  neutrophils, hemoglobin 12.9, hematocrit 37.3, and platelets 125.  CMP:  Sodium 131, potassium 4, chloride 100, bicarb 22, glucose 145, BUN 21,  and creatinine 1.04.  Liver function panel is within normal limits.  Plain film of the right foot shows no evidence of progressive change  suggestive of osteomyelitis with diffuse soft tissue swelling.   IMPRESSION AND PLAN:  1. Diabetic foot wound.  The patient states the wound improved after      his course of antibiotics for 2 weeks of Cipro and doxycycline      after hospital discharge.  However, the patient has had persistent      drainage.  Now increased erythema and swelling with a foul smell,      which has gotten worse over the last several days.  Based on the      previous culture results, we will start the patient on Cipro 400 mg      IV b.i.d. and clindamycin 300 mg IV q.6 h.  We will follow up on      the culture results obtained at the patient's primary care's      office, Dr. Burt Ek.  We will reconsult ortho in the morning to      evaluate whether the patient needs further surgical intervention,      i.e., further debridement or possible amputation.  An Infectious      Disease consultation should also be considered due to the patient's      failing antibiotics.  At a minimum, likely the patient will need 6      weeks of intravenous antibiotics to treat the underlying      osteomyelitis.  2. Hypertension.  The patient's blood pressure is elevated and he was      also febrile on arrival to the emergency department.  We will      continue the patient on his amlodipine and titrate his medications      as needed.  3. Diabetes. The patient's hemoglobin A1c was 7 on his last admission.      We will continue him on his Lantus, Januvia, and glipizide.  Obtain      CBG q.a.c. and at bedtime and then supplement with sliding scale      insulin if needed.  In addition to this, we will obtain a  hemoglobin A1c during this  admission.  4. Hyperlipidemia.  The patient is unsure of his home medications.  He      was discharged on Zocor 10 mg at bedtime.  We will continue this at      present, and obtain a fasting lipid panel in the morning.      Orson Slick, MD  Electronically Signed     PS/MEDQ  D:  12/08/2008  T:  12/09/2008  Job:  [id number]

## 2010-09-19 NOTE — Assessment & Plan Note (Signed)
OFFICE VISIT   POLLUX, EYE  DOB:  10/03/1955                                        August 03, 2009  CHART #:  [id number]   CURRENT PROBLEMS:  1. Recurrent left pleural effusion status post Pleurx catheter,      subsequently removed on July 07, 2009.  2. 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 Denese Killings.  3. Diabetes mellitus.   PRESENT ILLNESS:  The patient returns for a follow up after removal of  his Pleurx catheter 3 weeks ago.  He is working full time and denies  symptoms of CHF.  He is maintaining sinus rhythm and the sternal  incision is now completely healed and all sutures are removed.   PHYSICAL EXAMINATION:  Blood pressure 120/60, pulse 80, respirations 16,  saturation room air 95%.  Breath sounds are clear and equal.  The  sternal sitting is well healed.  Cardiac rhythm is regular, and he has a  soft flow murmur through the aortic valve prosthesis.  He has some  cellulitis and some skin ulceration in his left tibial lower leg.   DIAGNOSTIC TESTS:  A PA and lateral chest x-ray shows only a very mild  residual left pleural effusion, probably loculated posteriorly.  Otherwise, his lung fields are clear.   IMPRESSION AND PLAN:  The patient has fully recovered from his operation  5 months ago.  I would be happy to see him back for any future thoracic  surgical issues.  While he was in the  office today, we did clean his left leg and apply an Unna boot wrap  which we will leave on for 7 days and then remove and continue his skin  care regimen.   Ivin Poot, M.D.  Electronically Signed   PV/MEDQ  D:  08/03/2009  T:  08/03/2009  Job:  [id number]   cc:   Minus Breeding, MD, [facility]

## 2010-09-19 NOTE — Op Note (Signed)
[name], [name]               ACCOUNT NO.:  [id number]   MEDICAL RECORD NO.:  [id number]           PATIENT TYPE:   LOCATION:                                 FACILITY:   PHYSICIAN:  Lind Guest. Ninfa Linden, M.D.DATE OF BIRTH:  05-Oct-1955   DATE OF PROCEDURE:  11/08/2008  DATE OF DISCHARGE:                               OPERATIVE REPORT   PREOPERATIVE DIAGNOSIS:  Right foot first ray infected plantar ulcer  with questionable osteomyelitis, medial sesamoid.   POSTOPERATIVE DIAGNOSIS:  Right foot first ray infected plantar ulcer  with questionable osteomyelitis, medial sesamoid.   FINDINGS:  Gross purulence in soft tissue with minimal evidence of  osteomyelitis in medial sesamoid.   PROCEDURE:  Right foot first ray plantar ulcer extensive debridement  with irrigation and debridement.   SURGEON:  Lind Guest. Ninfa Linden, MD   ANESTHESIA:  General via LMA.   BLOOD LOSS:  Minimal.   COMPLICATIONS:  None.   INDICATIONS:  Briefly, [name] is a 55 year old gentleman who is a  diabetic.  He developed an ulcer on the plantar aspect under the first  ray of his right foot over a month ago.  He has had previous surgery on  this toe with bunion type of surgery and followed by a local podiatrist.  He presented 2 days ago to the emergency room with worsening and redness  and swelling in his right foot, and he had a peripheral white blood cell  count of 13,000.  He was admitted to the Medicine Service, who started  him appropriately on IV antibiotics.  This did control the infection and  cellulitis.  He did have a grossly purulent wound under the first ray  and an MRI was obtained and suggested myositis of the tissue and  infection and questionable edematous changes in the sesamoids concerning  for osteo.  However, he does walk quite a bit on this first ray and I  believe this was more a stress reaction.  I recommended he undergo  thorough irrigation and debridement of this area.   The risks and  benefits were explained to him in length and he understood the need to  proceed with surgery.   PROCEDURE DESCRIPTION:  After informed consent was obtained, the  appropriate right foot was marked.  He was brought to the operating  room, placed supine on the operating table.  General anesthesia was then  obtained.  His foot was prepped and draped with DuraPrep and sterile  drapes.  A time-out was called, he was identified as the correct patient  and correct right foot.  I then debrided necrotic tissue under the ulcer  that measured at least 2 cm x 1 cm on the plantar aspect of his foot  right under the first MTP joint.  The medial sesamoid exposed, and I  debrided the most plantar layer of bone, but it was nice and hard bone  and was not indicative of osteomyelitis.  I did remove all purulent  tissue and was able to then use the pulsatile lavage with 3 L of normal  saline solution followed by  500 mL of bacitracin to thoroughly clean the  wound.  I then loosely approximated the margins and to allow for some  padding in this area and place Xeroform followed by a well-padded  dressing over the area as well.  Of note, he does have retained hardware  on the dorsal aspect of the foot and I told him to keep this in place  because there was no  evidence if this hardware was infected and no evidence of septic  arthritis of the first MTP joint either.  After a sterile dressing was  applied, he was awakened, extubated, and taken to recovery room in  stable condition.  All final counts were correct, and there were no  complications noted.      Lind Guest. Ninfa Linden, M.D.  Electronically Signed     CYB/MEDQ  D:  11/08/2008  T:  11/08/2008  Job:  [id number]

## 2010-09-19 NOTE — Discharge Summary (Signed)
NAMEJARY, [name] NO.:  [id number]   MEDICAL RECORD NO.:  [id number]          PATIENT TYPE:  INP   LOCATION:  [id number]                         FACILITY:  [facility]   PHYSICIAN:  Jimmey Ralph, MD  DATE OF BIRTH:  1956-03-27   DATE OF ADMISSION:  12/08/2008  DATE OF DISCHARGE:  12/13/2008                               DISCHARGE SUMMARY   PRIMARY CARE PHYSICIAN:  [name], MD, [place].   DISCHARGE DIAGNOSES:  1. Osteomyelitis of the sesamoid bone.  2. Status post irrigation and debridement of the right foot and      removal of the necrotic tissue, removal of the right foot sesamoid      bone and removal of the hardware.  3. Diabetes type 2.  4. Hypertension.  5. Hyperlipidemia.   DISCHARGE MEDICATIONS:  1. Bactrim DS 2 tablets p.o. q.12 h. for 6 weeks, last dose on      January 19, 2009.  2. Lantus 30 units subcutaneous at bedtime.  3. Zocor 10 mg p.o. at bedtime.  4. Nexium 40 mg p.o. daily.  5. Amlodipine 5 mg p.o. daily.  6. Aspirin 325 mg p.o. daily.  7. Flagyl 500 mg po p.o. every 8 hrs   Weir:  [name] is [name] who was admitted on December 08, 2008, for infected  diabetic foot and sent in from a podiatrist for the same.  The patient  was started initially on IV antibiotics and initially was started on  Cipro and clindamycin.  The patient had an ortho evaluation with Dr.  Ninfa Linden and the patient underwent incision and drainage with removal of  the sesamoid bone and removal of deep buried [place] wires on the right  foot.  The patient's wound cultures sent initially on admission grew  moderate Staph aureus sensitive to clindamycin, levofloxacin, oxacillin,  rifampin, Bactrim, vancomycin, tetracycline, moxifloxacin, and  Acinetobacter baumannii which was sensitive to cefepime, ceftazidime,  ciprofloxacin, gentamicin, imipenem, tobramycin, and  Bactrim.  The blood  cultures sent on admission have been no growth to date and the repeat  wound cultures sent on December 09, 2008, grew the same.  In addition, also  grew E. coli which were sensitive to cephazolin, cefepime, ceftazidime,  ceftriaxone, imipenem, pip-tazo, tobramycin, Bactrim, and cefoxitin.  As  per discussions with ID, the patient is planned for discharge today with  Bactrim Double Strength 2 tablets q.12 h.  and flagyl 500 mg po Q8 hrs  for a total of 6 weeks.  The patient also needs to follow up with his  podiatrist and orthopedician, Dr. Ninfa Linden in next 1-2 weeks and his  primary care physician in next 1-2.  The patient has been advised  nonweightbearing on his foot and need to follow with Dr. Ninfa Linden for  further instructions regarding the same.  At present, the patient is  medically stable to be discharged and can be discharged home.   LABORATORY WORKUP DONE DURING THE STAY IN THE HOSPITAL:  Total WBC 10.1,  hemoglobin 13.2, hematocrit 37.6, and  platelet count of 207.  Sodium  131, potassium 4.0, chloride 100, bicarb 22, glucose 145, BUN 21, and  creatinine 1.04.  Total bilirubin 0.8, alkaline phosphatase 54, AST 17,  ALT 13, total protein 6.4, albumin 3.0, and calcium of 8.8.  Hemoglobin  A1c 7.0.  Total cholesterol 75, triglycerides 54, HDL 21, LDL 43, and  VLDL 11.  C-reactive protein is 9.1.   Wound cultures done on December 09, 2008, impression:  E. coli, moderate  staph aureus sensitive to clindamycin, levofloxacin, oxacillin,  rifampin, Bactrim, vancomycin, tetracycline, moxifloxacin, and MRSA  sensitive to vancomycin, tetracycline, rifampin, and clindamycin and  resistant to penicillin and gentamicin.   RADIOLOGICAL INVESTIGATIONS DONE DURING THE STAY IN THE HOSPITAL:  1. Chest x-ray done on December 12, 2008, impression:  No acute disease.      PICC line in cavoatrial junction.  2. Foot x-ray done on December 08, 2008, impression:  No significant      change  from the prior study.  No evidence of new arthritic changes      suggestive of osteomyelitis.  Previously, this had osteomyelitis of      sesamoid not read in the characterize of the radiograph.  Diffuse      soft tissue swelling compatible with previous 3 deep cellulitis and      myositis, ulceration over the distal first metatarsal with air      crack within lower level of sesamoid, scattered arterial      calcifications noted again.   DISPOSITION:  The patient at present is medically stable to be  discharged and can be discharged home.  The patient is recommended to  follow up with his primary care physician, Dr. Burt Ek;  orthopedician, Dr. Ninfa Linden; and his podiatrist as an outpatient in next  1-2 weeks.   TOTAL TIME AT DISCHARGE:  Half an hour.      Jimmey Ralph, MD  Electronically Signed     MP/MEDQ  D:  12/13/2008  T:  12/14/2008  Job:  [id number]   cc:   [name]. Ninfa Linden, M.D.  [name], M.D.

## 2010-09-19 NOTE — Assessment & Plan Note (Signed)
OFFICE VISIT   MARTESE, HADERLIE  DOB:  02/17/1956                                        May 23, 2009  CHART #:  [id number]   HISTORY:  The patient comes in today for a post discharge followup.  He  is status post coronary artery bypass grafting with aortic valve  replacement on March 14, 2009.  His postoperative course was  complicated by recurrent bilateral pleural effusions and sternal wound  infection which ultimately required plastic surgery consultation,  sternal debridement, and bilateral pectoralis muscle flaps as well as  left Pleurx catheter placement on April 20, 2009.  Following his  muscle flaps, his recovery went very smoothly and he was able to be  discharged home on May 10, 2009.  Since that time, he has seen Dr.  Denese Killings in the office and all of his sutures have been removed and his  wound appears to be healing well.  He does have one remaining JP drain  and this is to be reevaluated at his office visit tomorrow with Dr.  Denese Killings.  He also continues to have Pleurx catheter drained 3 times a  week by home health.  According to the records the patient has brought  with him today, the drainage appears to be decreasing in amounts, going  from 225 mL to 150 mL and the most recent 2 drainages were yielded 100  mL each.  He denies any shortness of breath or chest discomfort at this  time.  He will complete full course of Zinacef today with 1 dose  remaining this evening.  He is otherwise doing well.  He is progressing  with his daily activities and is doing well with ambulation and  mobility.  His appetite is improving.  Overall, he is progressing well.   PHYSICAL EXAMINATION:  Vital Signs:  Blood pressure is 154/90, pulse is  94, respirations 18, and O2 sat 98% on room air.  Chest:  His sternal  incision is healing well.  He does have some dry eschar from the mid to  the lower portion of the incision with no erythema or drainage.  He  has  a central JP drain, which is still in place with small amount of  serosanguineous fluid.  He also has a left-sided Pleurx catheter in  place.  The sutures from the previous chest tube sites are removed at  this time and the wound is well healed.  Heart:  Regular rate and rhythm  without murmurs, rubs, or gallops.  Lungs:  Overall clear to  auscultation.  Extremities:  No clubbing, cyanosis, or edema.   The chest x-ray shows only small bilateral pleural effusions, which are  significantly improved from a previous x-ray.   ASSESSMENT AND PLAN:  The patient is progressing well status post aortic  valve replacement and coronary artery bypass graft with subsequent  sternal wound infection, requiring pectoralis muscle flaps by Dr.  Denese Killings.  He does have an appointment with Dr. Denese Killings tomorrow May 24, 2009.  If everything looks good at this appointment, hopefully he  can resume his regular activities including driving.  Dr. Denese Killings will  continue to manage the JP drain.  Also, his Pleurx catheter drainage is  steadily decreasing.  We will contact the home health agency and plan on  decreasing the frequency of  his catheter drainage from 3 times weekly to  twice weekly.  Also, we will have then discontinue his PICC line after  he receives his final dose of antibiotics this evening.  He has had no  other changes in his medication since his previous visit.  We will leave  his medications as is for now, and he will return for followup with Dr.  Prescott Gum in 2-3 weeks with a repeat chest x-ray.   Ivin Poot, M.D.  Electronically Signed   GC/MEDQ  D:  05/23/2009  T:  05/24/2009  Job:  [id number]   cc:   Minus Breeding, MD, [facility]  Leda Quail, MD

## 2010-09-22 NOTE — Discharge Summary (Signed)
[name], Raymond NO.:  [id number]   MEDICAL RECORD NO.:  [id number]          PATIENT TYPE:  INP   LOCATION:  [id number]                         FACILITY:  [facility]   PHYSICIAN:  Evette Doffing, M.D.  DATE OF BIRTH:  [birth year]   DATE OF ADMISSION:  11/06/2008  DATE OF DISCHARGE:  11/09/2008                               DISCHARGE SUMMARY   PRIMARY CARE PHYSICIAN:  Dr. [name].   DISCHARGE DIAGNOSES:  1. Infected diabetic foot ulcer.  2. Diabetes.  3. Elevated blood pressure.  4. Hyperlipidemia.  5. Gastroesophageal reflux disease.   DISCHARGE MEDICATIONS:  1. Doxycycline 100 mg p.o. b.i.d. for 14 days.  2. Ciprofloxacin 500 mg twice daily for 14 days.  3. Lantus 55 units at bedtime.  4. Januvia 100 mg daily.  5. Glipizide 10 mg twice daily.  6. Nexium 40 mg daily.  7. Zocor 10 mg daily.  8. Aspirin 325 mg by mouth daily.   DISPOSITION AND FOLLOWUP:  The patient will be seeing Dr. Ninfa Linden with  Orthopedic Surgery later this week or early next week.  He will set up  that appointment.  The patient was also told that he needed followup  with his primary care [name] Dr. Burt Ek and the patient said that  he preferred to make his own appointment.  I recommended to the patient  that please make an appointment within the next 1-2 weeks.  Please check  his blood pressure and evaluate him for hypertension as his blood  pressure throughout hospitalization was elevated.  Please also check  progress with his foot ulcer.  Please also check a basic metabolic  panel.   PROCEDURE PERFORMED:  Irrigation and drainage by Orthopedic Surgery in  the operating room.   CONSULTATIONS:  Dr. Ninfa Linden with Orthopedic Surgery was involved in the  care of this patient.   HISTORY AND PHYSICAL:  [name] is a 55 year old male with past  medical history significant for diabetes type 2, complicated by foot  neuropathy who was admitted for an infected ulcer on his  right foot.  The patient said he developed a small ulcer on the plantar surface of  the first metatarsophalangeal joint in February.  He saw his podiatrist  then he scheduled to have the wire from a previous bunion surgery taken  out and his foot cleaned up later this month in July.  On Tuesday,  however, the patient noticed his foot becoming redder and the ulcer  getting bigger.  The patient went to Urgent Care on the morning of November 06, 2008 and was sent to the ED.  The patient denies any pain in his  foot, nausea/vomiting/diarrhea, or fevers/chills at home, but the  patient states he had a fever at urgent care.  No other concern at this  time.   PHYSICAL EXAMINATION:  VITAL SIGNS:  Temperature 98.6, blood pressure  141/88, pulse 107, respirations 16, oxygen saturation 97% on room air.  GENERAL:  Alert and oriented somewhat of a flat affect, no distress.  EYES:  Right conjunctival hemorrhage, PERRL, EOMI.  NECK:  No  lymphadenopathy.  No JVD.  RESPIRATORY:  CTAB, normal effort.  CV:  Systolic ejection murmur 3/6 best heard at the right sternal  border, borderline tachycardic, regular rhythm, no murmur, no rubs.  GI:  Positive bowel sounds, soft, nontender, nondistended.  EXTREMITIES:  There is a 4 cm ulcer/blister at the right foot MTP joint  with surrounding erythema and swelling, positive foul smelling, no  significant discharge.  Probed, the wound tunnel is about 1 inch deep.  Ulceration on left shin and minimal surrounding edema.  Decreased distal  pulses on the right, normal on left.  NEURO:  Cranial nerves II through XII intact.  PSYCH:  Flat affect.   ADMISSION LABS:  Comprehensive metabolic panel:  Sodium XX123456, potassium  4.6, chloride 104, bicarb 27, BUN 16, creatinine 0.82, glucose 171,  bilirubin 0.5, alk phos 63, AST 28, ALT 16, albumin 3.2, calcium 9.2.  Complete blood count:  White blood cell count 13.5, hemoglobin 14.8,  platelets 189, ANC 10.6, MCV 86.  Chest x-ray  showed no evidence of  osteomyelitis.  ESR was 54.   HOSPITAL COURSE:  1. Infected foot ulcer:  The patient was admitted for a foot ulcer      that developed in February and became infected on Tuesday of last      week.  The patient was admitted with an elevated white count and      the foot showed obvious ulceration and erythema indicative of      cellulitis.  There was no evidence of necrotizing fasciitis.  He      was originally started on vancomycin and Zosyn.  After his foot x-      ray showed no evidence of osteomyelitis, we got an MRI of his foot      considering the degree to what the wound tunneled.  MRI showed      evidence of osteomyelitis as well as myositis.  At that time, Dr.      Ninfa Linden with Orthopedic Surgery was consulted.  He took the      patient nonemergently to the operating room for irrigation and      drainage, which he said went well.  No toe amputation was      necessary.  He recommended discharging the patient on doxycycline      100 mg twice daily for 14 days as the wound culture showed Gram-      positive cocci in clusters and pairs that later speciated to staph      aureus.  Wound culture also grew Enterobacter, for which we      discharged the patient on ciprofloxacin 500 mg twice daily for 14      days.  Blood cultures remained negative throughout his admission      and the patient remained afebrile.  2. Elevated blood pressure:  The patient had elevated blood pressures      throughout his hospital course.  Systolic blood pressures got up to      XX123456 and diastolic up to the [id number].  He was asymptomatic; however,      so no intervention was thought to be needed at that time.  It is      recommended that he follow up with his primary care Aanyah Loa Dr.      Burt Ek and be evaluated for hypertension.  3. Diabetes:  His Lantus was continued in his hospital course as he      was put on sliding scale insulin.  His  blood glucose was well      controlled and  stayed below 200 throughout his admission.  An A1c      was also checked and was shown to be 7.5.  4. Hyperlipidemia:  His statin was continued and he got a lipid      profile.  5. GERD:  He was kept on a PPI throughout his admission.   DISCHARGE VITALS:  Temperature 98.6, blood pressure 150/90, pulse 98,  respirations 20, oxygen saturation 97% on room air.  Blood glucose 176.   DISCHARGE LABS:  Basic metabolic panel:  Sodium XX123456, potassium 3.9,  chloride 103, bicarb 30, BUN 10, creatinine 1.01, glucose 168, calcium  8.6.  CBC:  White blood cell count 10.6, hemoglobin 13.4, hematocrit  38.3, platelet count 180.      Neta Mends, MD  Electronically Signed      Evette Doffing, M.D.  Electronically Signed    AR/MEDQ  D:  11/10/2008  T:  11/11/2008  Job:  [id number]   cc:   Lind Guest. Ninfa Linden, M.D.  Dr. [name]

## 2010-10-16 ENCOUNTER — Encounter: Payer: Self-pay | Admitting: Cardiology

## 2010-10-17 ENCOUNTER — Ambulatory Visit (INDEPENDENT_AMBULATORY_CARE_PROVIDER_SITE_OTHER): Payer: BC Managed Care – PPO | Admitting: Cardiology

## 2010-10-17 ENCOUNTER — Encounter: Payer: Self-pay | Admitting: Cardiology

## 2010-10-17 VITALS — BP 98/62 | HR 96 | Ht 70.0 in | Wt 222.0 lb

## 2010-10-17 DIAGNOSIS — Z952 Presence of prosthetic heart valve: Secondary | ICD-10-CM

## 2010-10-17 DIAGNOSIS — Q231 Congenital insufficiency of aortic valve: Secondary | ICD-10-CM

## 2010-10-17 DIAGNOSIS — I10 Essential (primary) hypertension: Secondary | ICD-10-CM

## 2010-10-17 DIAGNOSIS — E119 Type 2 diabetes mellitus without complications: Secondary | ICD-10-CM

## 2010-10-17 DIAGNOSIS — E785 Hyperlipidemia, unspecified: Secondary | ICD-10-CM

## 2010-10-17 DIAGNOSIS — I2510 Atherosclerotic heart disease of native coronary artery without angina pectoris: Secondary | ICD-10-CM

## 2010-10-17 NOTE — Assessment & Plan Note (Signed)
The blood pressure is at target. No change in medications is indicated. We will continue with therapeutic lifestyle changes (TLC).  

## 2010-10-17 NOTE — Assessment & Plan Note (Signed)
The patient has no new sypmtoms.  No further cardiovascular testing is indicated.  We will continue with aggressive risk reduction and meds as listed.  

## 2010-10-17 NOTE — Assessment & Plan Note (Signed)
I will defer to [facility] D, MD with a goal LDL of less than 70 and HDL greater than 50.

## 2010-10-17 NOTE — Progress Notes (Signed)
HPI The patient presents for followup of his known coronary disease and aortic valve replacement. Since I last saw him he has done well. He has had no new cardiovascular complaints. He did have to have amputations on his left foot as a complication of his diabetes. This has left him with bilateral first and second toe amputations which as indicated his exercise walking and reduced his gait. He is retired now. He subsequently is eating more and has gained some weight. He denies any other cardiovascular symptoms that he had previously such as dyspnea. He said that chest pressure, neck or arm discomfort. He denies any palpitations, presyncope or syncope.   Allergies  Allergen Reactions  . Ace Inhibitors     Current Outpatient Prescriptions  Medication Sig Dispense Refill  . aspirin 325 MG tablet Take 325 mg by mouth daily.        . calcium carbonate (TUMS - DOSED IN MG ELEMENTAL CALCIUM) 500 MG chewable tablet Chew 2 tablets by mouth daily.        . Exenatide (BYDUREON [place]) Inject into the skin.        . furosemide (LASIX) 40 MG tablet Take 40 mg by mouth 2 (two) times daily.        . insulin glargine (LANTUS OPTICLIK) 100 UNIT/ML injection as directed.        . metoprolol (LOPRESSOR) 50 MG tablet Take 50 mg by mouth 2 (two) times daily.        . Multiple Vitamin (MULTIVITAMIN) tablet Take 1 tablet by mouth daily.        . pantoprazole (PROTONIX) 40 MG tablet Take 40 mg by mouth daily.        . potassium chloride SA (K-DUR,KLOR-CON) 20 MEQ tablet 20 mEq. Take 1 tab mon -wed - fri      . simvastatin (ZOCOR) 20 MG tablet Take 20 mg by mouth daily.        . sitaGLIPtan (JANUVIA) 100 MG tablet Take 100 mg by mouth daily.        . Tamsulosin HCl (FLOMAX) 0.4 MG CAPS Take 0.4 mg by mouth daily.        Marland Kitchen telmisartan (MICARDIS) 40 MG tablet Take 40 mg by mouth daily.        Bethann Humble Sulfate (EYE DROPS RELIEF OP) Apply to eye.        Marland Kitchen DISCONTD: esomeprazole (NEXIUM) 40 MG packet Take 40 mg  by mouth daily.        Marland Kitchen DISCONTD: glipiZIDE (GLUCOTROL) 5 MG 24 hr tablet Take 5 mg by mouth 2 (two) times daily.        Marland Kitchen DISCONTD: sulfamethoxazole-trimethoprim (BACTRIM DS) 800-160 MG per tablet Take 1 tablet by mouth 2 (two) times daily.          Past Medical History  Diagnosis Date  . Myocardial infarction October 2010    Non-ST-segment-elevation myocardial infarction. Cardiac Catheterization revealed severe triple-vessel coronary artery disease. He underwent coronary artery bypass grafting on March 14, 2009. At that time, he had a LIMA graft to the LAD as well as saphenous vein graft with to the diagonal and ramus intermediate. He had sequential saphenous vein graft to his RV marginal branch as well as the   . Myocardial infarction     posterior descending branch of the right coronary  artery.  . Hyperlipidemia   . Hypertension   . PVD (peripheral vascular disease)   . Diabetes mellitus     Past Surgical History  Procedure Date  . Coronary artery bypass graft     ROS:  As stated in the HPI and negative for all other systems.  PHYSICAL EXAM BP 98/62  Pulse 96  Ht 5\' 10"  (1.778 m)  Wt 222 lb (100.699 kg)  BMI 31.85 kg/m2 GENERAL:  Well appearing HEENT:  Pupils equal round and reactive, fundi not visualized, oral mucosa unremarkable NECK:  No jugular venous distention, waveform within normal limits, carotid upstroke brisk and symmetric, no bruits, no thyromegaly LYMPHATICS:  No cervical, inguinal adenopathy LUNGS:  Clear to auscultation bilaterally BACK:  No CVA tenderness CHEST:  Well healed sternotomy scar. HEART:  PMI not displaced or sustained,S1 and S2 within normal limits, no S3, no S4, no clicks, no rubs, soft apical systolic murmur ABD:  Flat, positive bowel sounds normal in frequency in pitch, no bruits, no rebound, no guarding, no midline pulsatile mass, no hepatomegaly, no splenomegaly EXT:  2 plus pulses upper, absent DP/PT left, absent PT right,  no cyanosis  no clubbing, mild edema, s/p bilateral amputations of first and second toes. SKIN:  No rashes no nodules NEURO:  Cranial nerves II through XII grossly intact, motor grossly intact throughout PSYCH:  Cognitively intact, oriented to person place and time  EKG:  Sinus rhythm, rate 96, left axis deviation, IVCD, no acute ST-T wave changes.  ASSESSMENT AND PLAN

## 2010-10-17 NOTE — Patient Instructions (Signed)
Follow up with Dr Percival Spanish in 1 year. Please continue your current medications as listed

## 2010-10-17 NOTE — Assessment & Plan Note (Signed)
He had a follow up echo in the months after the valve surgery.  No change in therapy or further imaging is indicated at this time.

## 2010-11-06 ENCOUNTER — Ambulatory Visit: Payer: BC Managed Care – PPO | Admitting: Cardiology

## 2011-01-31 ENCOUNTER — Encounter (HOSPITAL_COMMUNITY)
Admission: RE | Admit: 2011-01-31 | Discharge: 2011-01-31 | Disposition: A | Discharge status: Home / Self Care | Payer: BC Managed Care – PPO | Source: Ambulatory Visit | Attending: Orthopedic Surgery | Admitting: Orthopedic Surgery

## 2011-01-31 LAB — CBC
Hemoglobin: 10.7 g/dL — ABNORMAL LOW (ref 13.0–17.0)
RBC: 3.73 MIL/uL — ABNORMAL LOW (ref 4.22–5.81)
WBC: 8.6 10*3/uL (ref 4.0–10.5)

## 2011-01-31 LAB — PROTIME-INR: Prothrombin Time: 14.2 seconds (ref 11.6–15.2)

## 2011-01-31 LAB — COMPREHENSIVE METABOLIC PANEL
Alkaline Phosphatase: 71 U/L (ref 39–117)
BUN: 23 mg/dL (ref 6–23)
Chloride: 105 mEq/L (ref 96–112)
GFR calc Af Amer: >60 mL/min (ref >60)
Glucose, Bld: 101 mg/dL — ABNORMAL HIGH (ref 70–99)
Potassium: 4.8 mEq/L (ref 3.5–5.1)
Total Bilirubin: 0.3 mg/dL (ref 0.3–1.2)

## 2011-01-31 LAB — SURGICAL PCR SCREEN: MRSA, PCR: NEGATIVE

## 2011-02-01 ENCOUNTER — Other Ambulatory Visit: Payer: Self-pay | Admitting: Orthopedic Surgery

## 2011-02-01 DIAGNOSIS — I10 Essential (primary) hypertension: Secondary | ICD-10-CM | POA: Diagnosis present

## 2011-02-01 DIAGNOSIS — L97509 Non-pressure chronic ulcer of other part of unspecified foot with unspecified severity: Secondary | ICD-10-CM | POA: Diagnosis present

## 2011-02-01 DIAGNOSIS — I252 Old myocardial infarction: Secondary | ICD-10-CM

## 2011-02-01 DIAGNOSIS — Z01812 Encounter for preprocedural laboratory examination: Secondary | ICD-10-CM

## 2011-02-01 DIAGNOSIS — E1169 Type 2 diabetes mellitus with other specified complication: Principal | ICD-10-CM | POA: Diagnosis present

## 2011-02-01 DIAGNOSIS — L02619 Cutaneous abscess of unspecified foot: Secondary | ICD-10-CM | POA: Diagnosis present

## 2011-02-01 DIAGNOSIS — E1159 Type 2 diabetes mellitus with other circulatory complications: Secondary | ICD-10-CM | POA: Diagnosis present

## 2011-02-01 DIAGNOSIS — M9080 Osteopathy in diseases classified elsewhere, unspecified site: Secondary | ICD-10-CM | POA: Diagnosis present

## 2011-02-01 DIAGNOSIS — E1142 Type 2 diabetes mellitus with diabetic polyneuropathy: Secondary | ICD-10-CM | POA: Diagnosis present

## 2011-02-01 DIAGNOSIS — I2510 Atherosclerotic heart disease of native coronary artery without angina pectoris: Secondary | ICD-10-CM | POA: Diagnosis present

## 2011-02-01 DIAGNOSIS — E1149 Type 2 diabetes mellitus with other diabetic neurological complication: Secondary | ICD-10-CM | POA: Diagnosis present

## 2011-02-01 DIAGNOSIS — Z87891 Personal history of nicotine dependence: Secondary | ICD-10-CM

## 2011-02-01 DIAGNOSIS — M869 Osteomyelitis, unspecified: Secondary | ICD-10-CM | POA: Diagnosis present

## 2011-02-01 DIAGNOSIS — E785 Hyperlipidemia, unspecified: Secondary | ICD-10-CM | POA: Diagnosis present

## 2011-02-01 DIAGNOSIS — I96 Gangrene, not elsewhere classified: Secondary | ICD-10-CM | POA: Diagnosis present

## 2011-02-01 LAB — GLUCOSE, CAPILLARY: Glucose-Capillary: 143 mg/dL — ABNORMAL HIGH (ref 70–99)

## 2011-02-02 LAB — GLUCOSE, CAPILLARY
Glucose-Capillary: 109 mg/dL — ABNORMAL HIGH (ref 70–99)
Glucose-Capillary: 113 mg/dL — ABNORMAL HIGH (ref 70–99)

## 2011-02-02 LAB — BASIC METABOLIC PANEL
Chloride: 105 mEq/L (ref 96–112)
GFR calc Af Amer: >60 mL/min (ref >60)
Potassium: 4.3 mEq/L (ref 3.5–5.1)
Sodium: 139 mEq/L (ref 135–145)

## 2011-02-03 LAB — GLUCOSE, CAPILLARY: Glucose-Capillary: 133 mg/dL — ABNORMAL HIGH (ref 70–99)

## 2011-02-03 LAB — BASIC METABOLIC PANEL
CO2: 25 mEq/L (ref 19–32)
GFR calc non Af Amer: >60 mL/min (ref >60)
Glucose, Bld: 121 mg/dL — ABNORMAL HIGH (ref 70–99)
Potassium: 4.2 mEq/L (ref 3.5–5.1)
Sodium: 139 mEq/L (ref 135–145)

## 2011-02-04 LAB — GLUCOSE, CAPILLARY
Glucose-Capillary: 110 mg/dL — ABNORMAL HIGH (ref 70–99)
Glucose-Capillary: 176 mg/dL — ABNORMAL HIGH (ref 70–99)
Glucose-Capillary: 128 mg/dL — ABNORMAL HIGH (ref 70–99)
Glucose-Capillary: 129 mg/dL — ABNORMAL HIGH (ref 70–99)

## 2011-02-04 LAB — BASIC METABOLIC PANEL
CO2: 30 mEq/L (ref 19–32)
Chloride: 106 mEq/L (ref 96–112)
Creatinine, Ser: 0.97 mg/dL (ref 0.50–1.35)

## 2011-02-05 LAB — GLUCOSE, CAPILLARY: Glucose-Capillary: 121 mg/dL — ABNORMAL HIGH (ref 70–99)

## 2011-02-21 NOTE — Discharge Summary (Signed)
  NAMEMARTINE, SZALAY NO.:  [id number]  MEDICAL RECORD NO.:  [id number]  LOCATION:  [id number]                         FACILITY:  [facility]  PHYSICIAN:  Newt Minion, MD     DATE OF BIRTH:  [birth year]  DATE OF ADMISSION:  02/01/2011 DATE OF DISCHARGE:  02/05/2011                              DISCHARGE SUMMARY   FINAL DIAGNOSIS:  Abscess, osteomyelitis right forefoot.  SURGICAL PROCEDURE:  Right midfoot amputation.  Discharged to home in stable condition.  No prescription for pain. Follow up in the office in 1 week.  Minimize weightbearing of the right lower extremity with a postoperative shoe.  Discharged to home in stable condition.  Medications as per the medication reconciliation form.     Newt Minion, MD     MVD/MEDQ  D:  02/05/2011  T:  02/05/2011  Job:  [id number]  Electronically Signed by Meridee Score MD on 02/21/2011 06:16:29 AM

## 2011-02-21 NOTE — Op Note (Signed)
  [name], [name]               ACCOUNT NO.:  [id number]  MEDICAL RECORD NO.:  [id number]  LOCATION:  [id number]                         FACILITY:  [facility]  PHYSICIAN:  Newt Minion, MD     DATE OF BIRTH:  1956/04/16  DATE OF PROCEDURE:  02/01/2011 DATE OF DISCHARGE:                              OPERATIVE REPORT   PREOPERATIVE DIAGNOSIS:  Gangrene, osteomyelitis right forefoot.  POSTOPERATIVE DIAGNOSIS:  Gangrene, osteomyelitis right forefoot.  PROCEDURE:  Right midfoot.  SURGEON:  Newt Minion, MD  ANESTHESIA:  General.  ESTIMATED BLOOD LOSS:  Minimal.  ANTIBIOTICS:  2 g of Kefzol.  DRAINS:  None.  COMPLICATIONS:  None.  TOURNIQUET TIME:  None.  SPECIMEN:  Sent to Pathology.  DISPOSITION:  To PACU in stable condition.  INDICATIONS FOR PROCEDURE:  The patient is a 55 year old gentleman with diabetic neuropathy status post foot salvage surgery who has developed osteomyelitis, gangrene, ulceration, abscess cellulitis to the forefoot to his remaining toes.  Due to the progressive infection, failure of conservative care, the patient presents at this time for surgical intervention.  Risks and benefits were discussed including infection, neurovascular injury, nonhealing of the wound, need for higher-level amputation.  The patient states he understands and wished to proceed at this time.  DESCRIPTION OF PROCEDURE:  The patient was brought to OR room 1 and underwent a general anesthetic.  After adequate level of anesthesia was obtained, the patient's right lower extremity was prepped using DuraPrep and draped into a sterile field.  A fishmouth-type incision was made for the midfoot.  A transmetatarsal amputation was performed with the metatarsals transected through the base of the metatarsal with them beveled on the plantar aspect.  The wound was irrigated with normal saline.  Hemostasis was obtained.  The incision was closed using 2-0 nylon with a near-far, far-near  suture technique.  There was no tension on the skin.  The wound margins healed were approximated well.  There was good petechial bleeding.  No evidence of ischemia, necrosis, or gangrene in the midfoot.  The wound was covered with Adaptic, orthopedic sponges, ABD dressing, Kerlix, and Coban.  The patient was extubated and taken to PACU in stable condition.     Newt Minion, MD     MVD/MEDQ  D:  02/01/2011  T:  02/01/2011  Job:  [id number]  Electronically Signed by Meridee Score MD on 02/21/2011 06:16:33 AM

## 2011-07-23 ENCOUNTER — Encounter (HOSPITAL_COMMUNITY): Payer: Self-pay | Admitting: Pharmacy Technician

## 2011-07-23 ENCOUNTER — Other Ambulatory Visit (HOSPITAL_COMMUNITY): Payer: Self-pay | Admitting: Orthopedic Surgery

## 2011-07-25 ENCOUNTER — Encounter (HOSPITAL_COMMUNITY): Payer: Self-pay

## 2011-07-25 ENCOUNTER — Encounter (HOSPITAL_COMMUNITY)
Admission: RE | Admit: 2011-07-25 | Discharge: 2011-07-25 | Disposition: A | Discharge status: Home / Self Care | Payer: BC Managed Care – PPO | Source: Ambulatory Visit | Attending: Orthopedic Surgery | Admitting: Orthopedic Surgery

## 2011-07-25 ENCOUNTER — Encounter (HOSPITAL_COMMUNITY)
Admission: RE | Admit: 2011-07-25 | Discharge: 2011-07-25 | Disposition: A | Discharge status: Home / Self Care | Payer: BC Managed Care – PPO | Source: Ambulatory Visit | Attending: Anesthesiology | Admitting: Anesthesiology

## 2011-07-25 HISTORY — DX: Gastro-esophageal reflux disease without esophagitis: K21.9

## 2011-07-25 HISTORY — DX: Cardiac murmur, unspecified: R01.1

## 2011-07-25 LAB — CBC
MCH: 28.7 pg (ref 26.0–34.0)
MCV: 83.6 fL (ref 78.0–100.0)
Platelets: 136 10*3/uL — ABNORMAL LOW (ref 150–400)
RBC: 4.08 MIL/uL — ABNORMAL LOW (ref 4.22–5.81)

## 2011-07-25 LAB — COMPREHENSIVE METABOLIC PANEL
AST: 19 U/L (ref 0–37)
BUN: 22 mg/dL (ref 6–23)
CO2: 30 mEq/L (ref 19–32)
Calcium: 9.2 mg/dL (ref 8.4–10.5)
Creatinine, Ser: 1.25 mg/dL (ref 0.50–1.35)
GFR calc Af Amer: 74 mL/min — ABNORMAL LOW (ref >90)
GFR calc non Af Amer: 64 mL/min — ABNORMAL LOW (ref >90)

## 2011-07-25 LAB — PROTIME-INR
INR: 1.08 (ref 0.00–1.49)
Prothrombin Time: 14.2 seconds (ref 11.6–15.2)

## 2011-07-25 LAB — SURGICAL PCR SCREEN: MRSA, PCR: NEGATIVE

## 2011-07-25 NOTE — Pre-Procedure Instructions (Addendum)
[name]  07/25/2011   Your procedure is scheduled on:  3.22.13  Report to [facility] at 1100 AM.  Call this number if you have problems the morning of surgery: [phone]   Remember:   Do not eat food:After Midnight.  May have clear liquids: up to 4 Hours before arrival.700am  Clear liquids include soda, tea, black coffee, apple or grape juice, broth.  Take these medicines the morning of surgery with A SIP OF WATER:metoprolol , protonix,eye drops* STOP  Aspirin, multi vit   Do not wear jewelry, make-up or nail polish.  Do not wear lotions, powders, or perfumes. You may wear deodorant.  Do not shave 48 hours prior to surgery.  Do not bring valuables to the hospital.  Contacts, dentures or bridgework may not be worn into surgery.  Leave suitcase in the car. After surgery it may be brought to your room.  For patients admitted to the hospital, checkout time is 11:00 AM the day of discharge.   Patients discharged the day of surgery will not be allowed to drive home.  Name and phone number of your driver:terri rose sister [phone]  Special Instructions: Incentive Spirometry - Practice and bring it with you on the day of surgery. and CHG Shower Use Special Wash: 1/2 bottle night before surgery and 1/2 bottle morning of surgery.   Please read over the following fact sheets that you were given: Pain Booklet, Coughing and Deep Breathing, MRSA Information and Surgical Site Infection Prevention

## 2011-07-25 NOTE — Progress Notes (Signed)
Note from dr hochrein 6.12.12 in epic, echo 24.3.10, ekg 6.12.12

## 2011-07-26 MED ORDER — CEFAZOLIN SODIUM-DEXTROSE 2-3 GM-% IV SOLR
2.0000 g | INTRAVENOUS | Status: AC
  Administered 2011-07-27: 2 g via INTRAVENOUS
  Filled 2011-07-26: qty 50

## 2011-07-27 ENCOUNTER — Encounter (HOSPITAL_COMMUNITY)
Admission: RE | Disposition: A | Discharge status: Home / Self Care | Payer: Self-pay | Source: Ambulatory Visit | Attending: Orthopedic Surgery

## 2011-07-27 ENCOUNTER — Encounter (HOSPITAL_COMMUNITY): Payer: Self-pay | Admitting: Anesthesiology

## 2011-07-27 ENCOUNTER — Ambulatory Visit (HOSPITAL_COMMUNITY): Payer: BC Managed Care – PPO | Admitting: Anesthesiology

## 2011-07-27 ENCOUNTER — Encounter (HOSPITAL_COMMUNITY): Payer: Self-pay | Admitting: *Deleted

## 2011-07-27 ENCOUNTER — Ambulatory Visit (HOSPITAL_COMMUNITY)
Admission: RE | Admit: 2011-07-27 | Discharge: 2011-07-28 | Disposition: A | Discharge status: Home / Self Care | Payer: BC Managed Care – PPO | Source: Ambulatory Visit | Attending: Orthopedic Surgery | Admitting: Orthopedic Surgery

## 2011-07-27 DIAGNOSIS — I2510 Atherosclerotic heart disease of native coronary artery without angina pectoris: Secondary | ICD-10-CM | POA: Insufficient documentation

## 2011-07-27 DIAGNOSIS — E1169 Type 2 diabetes mellitus with other specified complication: Secondary | ICD-10-CM | POA: Insufficient documentation

## 2011-07-27 DIAGNOSIS — Z87891 Personal history of nicotine dependence: Secondary | ICD-10-CM | POA: Insufficient documentation

## 2011-07-27 DIAGNOSIS — M869 Osteomyelitis, unspecified: Secondary | ICD-10-CM | POA: Insufficient documentation

## 2011-07-27 DIAGNOSIS — I10 Essential (primary) hypertension: Secondary | ICD-10-CM | POA: Insufficient documentation

## 2011-07-27 DIAGNOSIS — E1149 Type 2 diabetes mellitus with other diabetic neurological complication: Secondary | ICD-10-CM | POA: Insufficient documentation

## 2011-07-27 DIAGNOSIS — Z794 Long term (current) use of insulin: Secondary | ICD-10-CM | POA: Insufficient documentation

## 2011-07-27 DIAGNOSIS — E1142 Type 2 diabetes mellitus with diabetic polyneuropathy: Secondary | ICD-10-CM | POA: Insufficient documentation

## 2011-07-27 DIAGNOSIS — Z01812 Encounter for preprocedural laboratory examination: Secondary | ICD-10-CM | POA: Insufficient documentation

## 2011-07-27 DIAGNOSIS — I252 Old myocardial infarction: Secondary | ICD-10-CM | POA: Insufficient documentation

## 2011-07-27 DIAGNOSIS — L97509 Non-pressure chronic ulcer of other part of unspecified foot with unspecified severity: Secondary | ICD-10-CM | POA: Insufficient documentation

## 2011-07-27 DIAGNOSIS — K219 Gastro-esophageal reflux disease without esophagitis: Secondary | ICD-10-CM | POA: Insufficient documentation

## 2011-07-27 DIAGNOSIS — Z951 Presence of aortocoronary bypass graft: Secondary | ICD-10-CM | POA: Insufficient documentation

## 2011-07-27 DIAGNOSIS — I739 Peripheral vascular disease, unspecified: Secondary | ICD-10-CM | POA: Insufficient documentation

## 2011-07-27 DIAGNOSIS — Z954 Presence of other heart-valve replacement: Secondary | ICD-10-CM | POA: Insufficient documentation

## 2011-07-27 DIAGNOSIS — M9080 Osteopathy in diseases classified elsewhere, unspecified site: Secondary | ICD-10-CM | POA: Insufficient documentation

## 2011-07-27 HISTORY — PX: AMPUTATION: SHX166

## 2011-07-27 LAB — GLUCOSE, CAPILLARY
Glucose-Capillary: 112 mg/dL — ABNORMAL HIGH (ref 70–99)
Glucose-Capillary: 82 mg/dL (ref 70–99)
Glucose-Capillary: 162 mg/dL — ABNORMAL HIGH (ref 70–99)

## 2011-07-27 SURGERY — AMPUTATION, FOOT, PARTIAL
Anesthesia: General | Site: Foot | Laterality: Left | Wound class: Dirty or Infected

## 2011-07-27 MED ORDER — ONDANSETRON HCL 4 MG/2ML IJ SOLN: 4.0000 mg | Freq: Four times a day (QID) | INTRAMUSCULAR | Status: DC | PRN

## 2011-07-27 MED ORDER — ASPIRIN 325 MG PO TABS
325.0000 mg | ORAL_TABLET | Freq: Every day | ORAL | Status: DC
Start: 2011-07-27 — End: 2011-07-28
  Administered 2011-07-27 – 2011-07-28 (×2): 325 mg via ORAL
  Filled 2011-07-27 (×2): qty 1

## 2011-07-27 MED ORDER — LACTATED RINGERS IV SOLN
INTRAVENOUS | Status: DC
  Administered 2011-07-27: 13:00:00 via INTRAVENOUS

## 2011-07-27 MED ORDER — INSULIN ASPART 100 UNIT/ML ~~LOC~~ SOLN
0.0000 [IU] | Freq: Three times a day (TID) | SUBCUTANEOUS | Status: DC
  Administered 2011-07-28: 3 [IU] via SUBCUTANEOUS
  Filled 2011-07-27: qty 3

## 2011-07-27 MED ORDER — FENTANYL CITRATE 0.05 MG/ML IJ SOLN
INTRAMUSCULAR | Status: AC
  Filled 2011-07-27: qty 2

## 2011-07-27 MED ORDER — METOPROLOL TARTRATE 50 MG PO TABS
50.0000 mg | ORAL_TABLET | Freq: Two times a day (BID) | ORAL | Status: DC
  Administered 2011-07-27 – 2011-07-28 (×2): 50 mg via ORAL
  Filled 2011-07-27 (×3): qty 1

## 2011-07-27 MED ORDER — BUPIVACAINE HCL (PF) 0.5 % IJ SOLN
INTRAMUSCULAR | Status: DC | PRN
  Administered 2011-07-27: 40 mL

## 2011-07-27 MED ORDER — METHOCARBAMOL 100 MG/ML IJ SOLN
500.0000 mg | Freq: Four times a day (QID) | INTRAVENOUS | Status: DC | PRN
  Filled 2011-07-27: qty 5

## 2011-07-27 MED ORDER — METOCLOPRAMIDE HCL 5 MG PO TABS
5.0000 mg | ORAL_TABLET | Freq: Three times a day (TID) | ORAL | Status: DC | PRN
  Filled 2011-07-27: qty 2

## 2011-07-27 MED ORDER — METHOCARBAMOL 500 MG PO TABS: 500.0000 mg | ORAL_TABLET | Freq: Four times a day (QID) | ORAL | Status: DC | PRN

## 2011-07-27 MED ORDER — PANTOPRAZOLE SODIUM 40 MG PO TBEC
40.0000 mg | DELAYED_RELEASE_TABLET | Freq: Every day | ORAL | Status: DC
  Administered 2011-07-28: 40 mg via ORAL
  Filled 2011-07-27: qty 1

## 2011-07-27 MED ORDER — IRBESARTAN 75 MG PO TABS
75.0000 mg | ORAL_TABLET | Freq: Every day | ORAL | Status: DC
  Administered 2011-07-27 – 2011-07-28 (×2): 75 mg via ORAL
  Filled 2011-07-27 (×2): qty 1

## 2011-07-27 MED ORDER — POTASSIUM CHLORIDE CRYS ER 20 MEQ PO TBCR: 20.0000 meq | EXTENDED_RELEASE_TABLET | ORAL | Status: DC

## 2011-07-27 MED ORDER — CEFAZOLIN SODIUM-DEXTROSE 2-3 GM-% IV SOLR
2.0000 g | Freq: Four times a day (QID) | INTRAVENOUS | Status: AC
Start: 2011-07-27 — End: 2011-07-28
  Administered 2011-07-27 – 2011-07-28 (×3): 2 g via INTRAVENOUS
  Filled 2011-07-27 (×3): qty 50

## 2011-07-27 MED ORDER — INSULIN GLARGINE 100 UNIT/ML ~~LOC~~ SOLN
60.0000 [IU] | Freq: Every day | SUBCUTANEOUS | Status: DC
  Administered 2011-07-27: 60 [IU] via SUBCUTANEOUS
  Filled 2011-07-27: qty 3

## 2011-07-27 MED ORDER — PROPOFOL 10 MG/ML IV EMUL
INTRAVENOUS | Status: DC | PRN
  Administered 2011-07-27: 75 ug/kg/min via INTRAVENOUS

## 2011-07-27 MED ORDER — INSULIN ASPART 100 UNIT/ML ~~LOC~~ SOLN
4.0000 [IU] | Freq: Three times a day (TID) | SUBCUTANEOUS | Status: DC
  Filled 2011-07-27: qty 3

## 2011-07-27 MED ORDER — HYDROMORPHONE HCL PF 1 MG/ML IJ SOLN: 0.5000 mg | INTRAMUSCULAR | Status: DC | PRN

## 2011-07-27 MED ORDER — LORATADINE 10 MG PO TABS
10.0000 mg | ORAL_TABLET | Freq: Every day | ORAL | Status: DC
  Administered 2011-07-28: 10 mg via ORAL
  Filled 2011-07-27 (×2): qty 1

## 2011-07-27 MED ORDER — 0.9 % SODIUM CHLORIDE (POUR BTL) OPTIME
TOPICAL | Status: DC | PRN
  Administered 2011-07-27: 1000 mL

## 2011-07-27 MED ORDER — OXYCODONE-ACETAMINOPHEN 5-325 MG PO TABS: 1.0000 | ORAL_TABLET | ORAL | Status: DC | PRN

## 2011-07-27 MED ORDER — HYDROMORPHONE HCL PF 1 MG/ML IJ SOLN: 0.2500 mg | INTRAMUSCULAR | Status: DC | PRN

## 2011-07-27 MED ORDER — SIMVASTATIN 20 MG PO TABS
20.0000 mg | ORAL_TABLET | Freq: Every day | ORAL | Status: DC
  Administered 2011-07-27: 20 mg via ORAL
  Filled 2011-07-27 (×2): qty 1

## 2011-07-27 MED ORDER — SODIUM CHLORIDE 0.9 % IV SOLN
INTRAVENOUS | Status: DC
  Administered 2011-07-27: 10 mL/h via INTRAVENOUS

## 2011-07-27 MED ORDER — MIDAZOLAM HCL 2 MG/2ML IJ SOLN
INTRAMUSCULAR | Status: AC
  Filled 2011-07-27: qty 2

## 2011-07-27 MED ORDER — LACTATED RINGERS IV SOLN
INTRAVENOUS | Status: DC | PRN
  Administered 2011-07-27: 13:00:00 via INTRAVENOUS

## 2011-07-27 MED ORDER — FENTANYL CITRATE 0.05 MG/ML IJ SOLN
50.0000 ug | INTRAMUSCULAR | Status: DC | PRN
  Administered 2011-07-27: 100 ug via INTRAVENOUS

## 2011-07-27 MED ORDER — MIDAZOLAM HCL 2 MG/2ML IJ SOLN
1.0000 mg | INTRAMUSCULAR | Status: DC | PRN
  Administered 2011-07-27: 2 mg via INTRAVENOUS

## 2011-07-27 MED ORDER — ONDANSETRON HCL 4 MG PO TABS: 4.0000 mg | ORAL_TABLET | Freq: Four times a day (QID) | ORAL | Status: DC | PRN

## 2011-07-27 MED ORDER — INSULIN GLARGINE 100 UNIT/ML ~~LOC~~ SOLN: 60.0000 [IU] | Freq: Every day | SUBCUTANEOUS | Status: DC

## 2011-07-27 MED ORDER — HYDROCODONE-ACETAMINOPHEN 5-325 MG PO TABS: 1.0000 | ORAL_TABLET | ORAL | Status: DC | PRN

## 2011-07-27 MED ORDER — METOCLOPRAMIDE HCL 5 MG/ML IJ SOLN: 5.0000 mg | Freq: Three times a day (TID) | INTRAMUSCULAR | Status: DC | PRN

## 2011-07-27 SURGICAL SUPPLY — 39 items
BANDAGE GAUZE ELAST BULKY 4 IN (GAUZE/BANDAGES/DRESSINGS) ×4 IMPLANT
BLADE SAW SGTL HD 18.5X60.5X1. (BLADE) ×2 IMPLANT
BLADE SURG 10 STRL SS (BLADE) IMPLANT
BNDG COHESIVE 4X5 TAN STRL (GAUZE/BANDAGES/DRESSINGS) ×2 IMPLANT
BNDG COHESIVE 6X5 TAN STRL LF (GAUZE/BANDAGES/DRESSINGS) ×1 IMPLANT
CLOTH BEACON ORANGE TIMEOUT ST (SAFETY) ×2 IMPLANT
COVER SURGICAL LIGHT HANDLE (MISCELLANEOUS) ×2 IMPLANT
CUFF TOURNIQUET SINGLE 34IN LL (TOURNIQUET CUFF) IMPLANT
CUFF TOURNIQUET SINGLE 44IN (TOURNIQUET CUFF) IMPLANT
DRAPE U-SHAPE 47X51 STRL (DRAPES) ×2 IMPLANT
DRSG ADAPTIC 3X8 NADH LF (GAUZE/BANDAGES/DRESSINGS) ×2 IMPLANT
DRSG PAD ABDOMINAL 8X10 ST (GAUZE/BANDAGES/DRESSINGS) ×2 IMPLANT
DURAPREP 26ML APPLICATOR (WOUND CARE) ×2 IMPLANT
ELECT REM PT RETURN 9FT ADLT (ELECTROSURGICAL) ×2
ELECTRODE REM PT RTRN 9FT ADLT (ELECTROSURGICAL) ×1 IMPLANT
GLOVE BIOGEL PI IND STRL 9 (GLOVE) ×1 IMPLANT
GLOVE BIOGEL PI INDICATOR 9 (GLOVE) ×1
GLOVE SURG ORTHO 9.0 STRL STRW (GLOVE) ×2 IMPLANT
GOWN PREVENTION PLUS XLARGE (GOWN DISPOSABLE) ×2 IMPLANT
GOWN SRG XL XLNG 56XLVL 4 (GOWN DISPOSABLE) ×2 IMPLANT
GOWN STRL NON-REIN XL XLG LVL4 (GOWN DISPOSABLE) ×2
KIT BASIN OR (CUSTOM PROCEDURE TRAY) ×2 IMPLANT
KIT ROOM TURNOVER OR (KITS) ×2 IMPLANT
MANIFOLD NEPTUNE II (INSTRUMENTS) ×2 IMPLANT
NS IRRIG 1000ML POUR BTL (IV SOLUTION) ×2 IMPLANT
PACK ORTHO EXTREMITY (CUSTOM PROCEDURE TRAY) ×2 IMPLANT
PAD ARMBOARD 7.5X6 YLW CONV (MISCELLANEOUS) ×4 IMPLANT
PAD CAST 4YDX4 CTTN HI CHSV (CAST SUPPLIES) ×1 IMPLANT
PADDING CAST COTTON 4X4 STRL (CAST SUPPLIES)
SPONGE GAUZE 4X4 12PLY (GAUZE/BANDAGES/DRESSINGS) ×2 IMPLANT
SPONGE LAP 18X18 X RAY DECT (DISPOSABLE) ×2 IMPLANT
STAPLER VISISTAT 35W (STAPLE) ×1 IMPLANT
SUT ETHILON 2 0 PSLX (SUTURE) ×5 IMPLANT
SUT VIC AB 2-0 CTB1 (SUTURE) ×2 IMPLANT
TOWEL OR 17X24 6PK STRL BLUE (TOWEL DISPOSABLE) ×2 IMPLANT
TOWEL OR 17X26 10 PK STRL BLUE (TOWEL DISPOSABLE) ×2 IMPLANT
TUBE CONNECTING 12X1/4 (SUCTIONS) ×2 IMPLANT
WATER STERILE IRR 1000ML POUR (IV SOLUTION) ×1 IMPLANT
YANKAUER SUCT BULB TIP NO VENT (SUCTIONS) ×2 IMPLANT

## 2011-07-27 NOTE — Preoperative (Signed)
Beta Blockers   Reason not to administer Beta Blockers:BB taken today 0630

## 2011-07-27 NOTE — Op Note (Signed)
OPERATIVE REPORT  DATE OF SURGERY: 07/27/2011  PATIENT:  [name],  56 y.o. male  PRE-OPERATIVE DIAGNOSIS:  Osteomyelitis Left Forefoot  POST-OPERATIVE DIAGNOSIS:  Osteomyelitis Left Forefoot  PROCEDURE:  Procedure(s): AMPUTATION FOOT Transmetatarsal amputation left foot  SURGEON:  Surgeon(s): Newt Minion, MD  ANESTHESIA:   regional  EBL:  Minimal ML  SPECIMEN:  No Specimen  TOURNIQUET:  * Missing tourniquet times found for documented tourniquets in log:  [id number] *  PROCEDURE DETAILS: Patient is a 56 year old gentleman with diabetes peripheral neuropathy status post first ray amputation on the left and presents with osteomyelitis and gangrenous changes of the remainder of his forefoot. Patient failed conservative treatment and presents at this time for transmetatarsal amputation. And benefits were discussed including infection neurovascular injury persistent pain DVT pulmonary embolus need for additional surgery. Patient states he understands was pursued this time. Description of procedure patient was brought to or room 15 and underwent a ankle block. After adequate levels of anesthesia were obtained patient's left lower extremity was prepped using DuraPrep and draped into a sterile field in the forefoot was draped out of the sterile field using Ioban drape. A fishmouth incision was made through the midfoot. This was carried down an oscillating saw was used to create a transmetatarsal amputation with the plantar aspect of both and a gentle cascade of the metatarsals. Electrocautery was used for hemostasis normal saline was used for irrigation. The wound was closed using 2-0 nylon. The wound was covered with Adaptic orthopedic sponges Kerlix web roll and a compressive wrap was applied with Coban and from the tibial tubercle to the toes. Patient was then taken the PACU in stable condition plan for overnight observation discharged to home tomorrow  PLAN OF CARE: Admit for overnight  observation  PATIENT DISPOSITION:  PACU - hemodynamically stable.   Newt Minion, MD 07/27/2011 2:22 PM

## 2011-07-27 NOTE — Anesthesia Procedure Notes (Signed)
Anesthesia Regional Block:  Ankle block  Pre-Anesthetic Checklist: ,, timeout performed, Correct Patient, Correct Site, Correct Laterality, Correct Procedure, Correct Position, site marked, Risks and benefits discussed,  Surgical consent,  Pre-op evaluation,  At surgeon's request and post-op pain management  Laterality: Left  Prep: chloraprep       Needles:  Injection technique: Single-shot      Needle Gauge: 25 and 25 G    Additional Needles:  Procedures: other  (infiltration) Ankle block Narrative:  Start time: 07/27/2011 1:20 PM End time: 07/27/2011 1:29 PM Injection made incrementally with aspirations every 5 mL.  Performed by: Personally  Anesthesiologist: Jenita Seashore, MD  Additional Notes: Pt identified in Holding room.  Monitors applied. Working IV access confirmed. Sterile prep L ankle.  #25ga infiltration of local anesthetic around deep peroneal, superficial peroneal, saphenous, posterior tibial ,and sural nerves.  Total 40cc 0.5% Bupivacaine injected incrementally.  Patient asymptomatic, VSS, no heme aspirated, tolerated well.   Jenita Seashore, MD

## 2011-07-27 NOTE — Transfer of Care (Signed)
Immediate Anesthesia Transfer of Care Note  Patient: [name]  Procedure(s) Performed: Procedure(s) (LRB): AMPUTATION FOOT (Left)  Patient Location: PACU  Anesthesia Type: MAC and Regional  Level of Consciousness: awake, alert , oriented and patient cooperative  Airway & Oxygen Therapy: Patient Spontanous Breathing and Patient connected to nasal cannula oxygen  Post-op Assessment: Report given to PACU RN, Post -op Vital signs reviewed and stable and Patient moving all extremities X 4  Post vital signs: Reviewed and stable  Complications: No apparent anesthesia complications

## 2011-07-27 NOTE — Discharge Instructions (Signed)
Progressive ambulation nonweightbearing left lower extremity. Keep left leg elevated.

## 2011-07-27 NOTE — Anesthesia Postprocedure Evaluation (Signed)
  Anesthesia Post-op Note  Patient: [name]  Procedure(s) Performed: Procedure(s) (LRB): AMPUTATION FOOT (Left)  Patient Location: PACU  Anesthesia Type: MAC and Regional  Level of Consciousness: awake, alert  and oriented  Airway and Oxygen Therapy: Patient Spontanous Breathing and Patient connected to nasal cannula oxygen  Post-op Pain: none  Post-op Assessment: Post-op Vital signs reviewed, Patient's Cardiovascular Status Stable, Respiratory Function Stable, Patent Airway, No signs of Nausea or vomiting and Pain level controlled  Post-op Vital Signs: Reviewed and stable  Complications: No apparent anesthesia complications

## 2011-07-27 NOTE — H&P (Signed)
[name] is an 56 y.o. male.   Chief Complaint: Ulceration and osteomyelitis left forefoot HPI: Patient is status post a forefoot surgery for foot salvage reconstruction. Patient has had progressive ulceration and infection and osteomyelitis and presents at this time for transmetatarsal amputation.  Past Medical History  Diagnosis Date  . Myocardial infarction October 2010    Non-ST-segment-elevation myocardial infarction. Cardiac Catheterization revealed severe triple-vessel coronary artery disease. He underwent coronary artery bypass grafting on March 14, 2009. At that time, he had a LIMA graft to the LAD as well as saphenous vein graft with to the diagonal and ramus intermediate. He had sequential saphenous vein graft to his RV marginal branch as well as the   . Myocardial infarction     posterior descending branch of the right coronary  artery.  . Hyperlipidemia   . Hypertension   . PVD (peripheral vascular disease)   . Diabetes mellitus   . Heart murmur   . GERD (gastroesophageal reflux disease)     Past Surgical History  Procedure Date  . Amputation     Left great and second toes 2011  . Amputation     Left great and second toes 2011  . Aortic valve replacement     Pericardial tissue valve  . Coronary artery bypass graft 10    Family History  Problem Relation Age of Onset  . Diabetes    . Coronary artery disease    . Heart attack Father     early age  . Heart attack Brother     At age 77   Social History:  reports that he quit smoking about 10 years ago. He does not have any smokeless tobacco history on file. He reports that he does not drink alcohol or use illicit drugs.  Allergies:  Allergies  Allergen Reactions  . Ace Inhibitors Cough    Medications Prior to Admission  Medication Dose Route Frequency Provider Last Rate Last Dose  . ceFAZolin (ANCEF) IVPB 2 g/50 mL premix  2 g Intravenous 60 min Pre-Op Raymond Minion, MD       Medications Prior to  Admission  Medication Sig Dispense Refill  . aspirin 325 MG tablet Take 325 mg by mouth daily.        . Exenatide (BYDUREON [place]) Inject 2 mg into the skin once a week. monday      . insulin glargine (LANTUS OPTICLIK) 100 UNIT/ML injection Inject 60 Units into the skin at bedtime. Pt took only 60 uniits last pm on 07/26/2011      . metoprolol (LOPRESSOR) 50 MG tablet Take 50 mg by mouth 2 (two) times daily.        . Multiple Vitamin (MULTIVITAMIN) tablet Take 1 tablet by mouth daily.        . pantoprazole (PROTONIX) 40 MG tablet Take 40 mg by mouth daily.        . potassium chloride SA (K-DUR,KLOR-CON) 20 MEQ tablet Take 20 mEq by mouth 3 (three) times a week. Take 1 tab mon -wed - fri      . simvastatin (ZOCOR) 20 MG tablet Take 20 mg by mouth daily.        . sitaGLIPtan (JANUVIA) 100 MG tablet Take 100 mg by mouth daily.        Marland Kitchen telmisartan (MICARDIS) 40 MG tablet Take 40 mg by mouth daily.        Bethann Humble Sulfate (EYE DROPS RELIEF OP) Apply 1 drop to eye daily  as needed. For dry eyes        Results for orders placed during the hospital encounter of 07/27/11 (from the past 48 hour(s))  GLUCOSE, CAPILLARY     Status: Abnormal   Collection Time   07/27/11 10:45 AM      Component Value Range Comment   Glucose-Capillary 112 (*) 70 - 99 (mg/dL)    Dg Chest 2 View  07/25/2011  *RADIOLOGY REPORT*  Clinical Data: Preop for left mid foot amputation  CHEST - 2 VIEW  Comparison: Chest x-ray of 05/03/2010  Findings: Linear scarring in the lingula is stable.  No active infiltrate or effusion is seen.  Changes of prior CABG and aortic valve replacement are noted.  The heart is mildly enlarged and stable.  No bony abnormality is seen.  IMPRESSION: No active lung disease.  Stable linear scarring at the left lung base.  Mild cardiomegaly.  Original Report Authenticated By: [name], M.D.    Review of Systems  All other systems reviewed and are negative.    Blood pressure 156/85, pulse  96, temperature 97.9 F (36.6 C), temperature source Oral, resp. rate 18, SpO2 99.00%. Physical Exam  On examination patient has palpable pulses he is status post partial forefoot amputation and now presents with worsening osteomyelitis ulceration and infection of the forefoot. Assessment/Plan Assessment: Osteomyelitis and Wagner grade 3 ulceration left forefoot.  Plan: We'll plan for transmetatarsal amputation. Risks and benefits were discussed including infection neurovascular injury nonhealing of the wound need for additional surgery patient states he understands and wished to proceed at this time.  [name] V 07/27/2011, 11:24 AM

## 2011-07-27 NOTE — Progress Notes (Signed)
Orthopedic Tech Progress Note Patient Details:  [name] Sep 13, 1955 [id number]  Other Ortho Devices Type of Ortho Device: Postop boot Ortho Device Location: left foot Ortho Device Interventions: Application   Hildred Priest 07/27/2011, 4:54 PM

## 2011-07-27 NOTE — Anesthesia Preprocedure Evaluation (Addendum)
Anesthesia Evaluation  Patient identified by MRN, date of birth, ID band Patient awake    Reviewed: Allergy & Precautions, H&P , NPO status , Patient's Chart, lab work & pertinent test results, reviewed documented beta blocker date and time   History of Anesthesia Complications Negative for: history of anesthetic complications  Airway Mallampati: II TM Distance: >3 FB Neck ROM: Full    Dental  (+) Partial Upper, Missing and Dental Advisory Given   Pulmonary former smoker (quit 10 years) breath sounds clear to auscultation  Pulmonary exam normal       Cardiovascular hypertension, Pt. on medications and Pt. on home beta blockers + CAD (s/p CABG), + Past MI and + Peripheral Vascular Disease + Valvular Problems/Murmurs (s/p AVR for bicuspid valve) AS Rhythm:Regular Rate:Normal + Systolic murmurs (II/VI) ECHO '10: normal LVF, EF 60-65%, prosthetic valve OK   Neuro/Psych negative neurological ROS     GI/Hepatic Neg liver ROS, GERD-  Controlled,  Endo/Other  Diabetes mellitus-, Type 2, Insulin Dependent and Oral Hypoglycemic AgentsGlu 82 today  Renal/GU negative Renal ROS     Musculoskeletal   Abdominal (+) + obese,   Peds  Hematology   Anesthesia Other Findings   Reproductive/Obstetrics                           Anesthesia Physical Anesthesia Plan  ASA: III  Anesthesia Plan: Regional and MAC   Post-op Pain Management:    Induction:   Airway Management Planned: Simple Face Mask  Additional Equipment:   Intra-op Plan:   Post-operative Plan:   Informed Consent: I have reviewed the patients History and Physical, chart, labs and discussed the procedure including the risks, benefits and alternatives for the proposed anesthesia with the patient or authorized representative who has indicated his/her understanding and acceptance.   Dental advisory given  Plan Discussed with: Surgeon and  CRNA  Anesthesia Plan Comments: (Plan routine monitors, ankle block with MAC/heavy sedation )        Anesthesia Quick Evaluation

## 2011-07-28 LAB — GLUCOSE, CAPILLARY: Glucose-Capillary: 111 mg/dL — ABNORMAL HIGH (ref 70–99)

## 2011-07-28 MED ORDER — HYDROCODONE-ACETAMINOPHEN 5-325 MG PO TABS: 1.0000 | ORAL_TABLET | ORAL | Status: AC | PRN

## 2011-07-28 MED ORDER — METHOCARBAMOL 500 MG PO TABS: 500.0000 mg | ORAL_TABLET | Freq: Four times a day (QID) | ORAL | Status: AC | PRN

## 2011-07-28 NOTE — Progress Notes (Signed)
Pt stable Pain controlled Dressing dry Dc today

## 2011-07-28 NOTE — Progress Notes (Signed)
PT EVALUATION  07/28/11 1006  PT Visit Information  Last PT Received On 07/28/11  Patient Stated Goals  Goal #1 get back to normal taking care of self  Restrictions  Weight Bearing Restrictions Yes  LLE Weight Bearing WBAT (on heel only)  Home Living  Lives With [place] Help From Family;Friend(s)  Type of [employer] One level  Home Access Ramped entrance  Bathroom Shower/Tub Tub/shower unit;Curtain  Corporate treasurer Yes  How Accessible Accessible via walker  [place] Crutches;Walker - rolling;Other (comment) (plastic patio chair for tou)  Prior Function  Level of Independence Independent with basic ADLs;Independent with homemaking with ambulation;Independent with gait;Independent with transfers  Driving Yes  Vocation Retired  Field seismologist Awake/alert  Overall [place] within functional limits for tasks assessed  Orientation Level Oriented X4  Sensation  Light Touch Not tested (LEFT FOOT WRAPPED IN BANDAGE AND COBAN)  Bed Mobility  Bed Mobility Yes  Supine to Sit 7: Independent;HOB flat  Transfers  Transfers Yes  Sit to Stand From bed;With upper extremity assist;6: Modified independent (Device/Increase time)  Stand to Sit 6: Modified independent (Device/Increase time);With armrests;To chair/3-in-1  Ambulation/Gait  Ambulation/Gait Yes  Ambulation/Gait Assistance 6: Modified independent (Device/Increase time)  Ambulation Distance (Feet) 100 Feet  Assistive device Rolling walker  Gait Pattern Step-to pattern  Gait velocity slowed  Stairs No  RUE Assessment  RUE Assessment Not tested (appears [facility] for RW use)  LUE Assessment  LUE Assessment Not tested (appears [facility] for RW use)  RLE Assessment  RLE Assessment WFL  LLE Assessment  LLE Assessment WFL  PT - End of Session  Equipment Utilized During Treatment Gait belt;Other (comment) (left post op shoe)  Activity  Tolerance Patient tolerated treatment well  Patient left in chair;with call bell in reach  Nurse Communication Mobility status for transfers;Mobility status for ambulation;Weight bearing status  General  Behavior During Session [facility] for tasks performed  Cognition [facility] for tasks performed   PT Assessment  Clinical Impression Statement Pt is s/p transmetatarsa amputationl left forefoot and is now WBAT left heel only.  Pt. has used RW in past and is comfortable with use moving forward until left foot heals.  Demonstrates safe technique with RW and can demonstrate heel WB only.  Do not believe he needs F/U PT in the home.  All education completed and he is at mod. I level.  Will sign off.  Pt. to be [place] home today.  PT Recommendation  Follow Up Recommendations No PT follow up  Equipment Recommended None recommended by PT   Gerlean Ren PT Acute Rehab Services [phone] [place] [phone]

## 2011-07-28 NOTE — Progress Notes (Signed)
D/C instructions reviewed with patient and friend. Rx x 2 given. No hh equipment or services needed. Pt d/c'ed via wheelchair in stable condition

## 2011-07-30 ENCOUNTER — Encounter (HOSPITAL_COMMUNITY): Payer: Self-pay | Admitting: Orthopedic Surgery

## 2011-07-30 MED FILL — Insulin Glargine Inj 100 Unit/ML: SUBCUTANEOUS | Qty: 0.6 | Status: AC

## 2011-07-30 MED FILL — Insulin Aspart Inj 100 Unit/ML: SUBCUTANEOUS | Qty: 0.03 | Status: AC

## 2011-08-28 ENCOUNTER — Encounter: Payer: Self-pay | Admitting: Cardiology

## 2011-10-16 ENCOUNTER — Encounter: Payer: Self-pay | Admitting: Cardiology

## 2011-10-16 ENCOUNTER — Ambulatory Visit (INDEPENDENT_AMBULATORY_CARE_PROVIDER_SITE_OTHER): Payer: BC Managed Care – PPO | Admitting: Cardiology

## 2011-10-16 VITALS — BP 100/70 | HR 104 | Ht 70.0 in | Wt 219.0 lb

## 2011-10-16 DIAGNOSIS — I10 Essential (primary) hypertension: Secondary | ICD-10-CM

## 2011-10-16 DIAGNOSIS — I359 Nonrheumatic aortic valve disorder, unspecified: Secondary | ICD-10-CM

## 2011-10-16 DIAGNOSIS — E119 Type 2 diabetes mellitus without complications: Secondary | ICD-10-CM

## 2011-10-16 DIAGNOSIS — Q231 Congenital insufficiency of aortic valve: Secondary | ICD-10-CM

## 2011-10-16 DIAGNOSIS — E785 Hyperlipidemia, unspecified: Secondary | ICD-10-CM

## 2011-10-16 DIAGNOSIS — Z952 Presence of prosthetic heart valve: Secondary | ICD-10-CM | POA: Insufficient documentation

## 2011-10-16 DIAGNOSIS — I059 Rheumatic mitral valve disease, unspecified: Secondary | ICD-10-CM

## 2011-10-16 DIAGNOSIS — I2510 Atherosclerotic heart disease of native coronary artery without angina pectoris: Secondary | ICD-10-CM

## 2011-10-16 DIAGNOSIS — Z954 Presence of other heart-valve replacement: Secondary | ICD-10-CM

## 2011-10-16 NOTE — Assessment & Plan Note (Signed)
I reviewed his recent lipid profile with an LDL of 44 and HDL of 34 in April of this year. He will continue on the therapy as listed.

## 2011-10-16 NOTE — Assessment & Plan Note (Signed)
The patient has no new sypmtoms.  No further cardiovascular testing is indicated.  We will continue with aggressive risk reduction and meds as listed.  

## 2011-10-16 NOTE — Assessment & Plan Note (Signed)
His diabetes is followed closely by his primary provider and is much better than previous. He will continue on the meds as listed.

## 2011-10-16 NOTE — Patient Instructions (Signed)

## 2011-10-16 NOTE — Progress Notes (Signed)
HPI The patient presents for followup of his known coronary disease and aortic valve replacement. Since I last saw him he has done well.  He has had amputation of all of his toes since I last saw him. He says he's not getting any of the dyspnea that was his previous cardiac complaints.  The patient denies any new symptoms such as chest discomfort, neck or arm discomfort. There has been no new shortness of breath, PND or orthopnea. There have been no reported palpitations, presyncope or syncope.    Allergies  Allergen Reactions  . Ace Inhibitors Cough    Current Outpatient Prescriptions  Medication Sig Dispense Refill  . aspirin 325 MG tablet Take 325 mg by mouth daily.        . calcium-vitamin D (OSCAL WITH D) 500-200 MG-UNIT per tablet Take 1 tablet by mouth daily.      . cetirizine (ZYRTEC) 10 MG tablet Take 10 mg by mouth daily.      . Exenatide (BYDUREON [place]) Inject 2 mg into the skin once a week. monday      . insulin glargine (LANTUS OPTICLIK) 100 UNIT/ML injection Inject 60 Units into the skin at bedtime. Pt took only 60 uniits last pm on 07/26/2011      . metoprolol (LOPRESSOR) 50 MG tablet Take 50 mg by mouth 2 (two) times daily.        . Multiple Vitamin (MULTIVITAMIN) tablet Take 1 tablet by mouth daily.        . pantoprazole (PROTONIX) 40 MG tablet Take 40 mg by mouth daily.        . potassium chloride SA (K-DUR,KLOR-CON) 20 MEQ tablet Take 20 mEq by mouth 3 (three) times a week. Take 1 tab mon -wed - fri      . simvastatin (ZOCOR) 20 MG tablet Take 20 mg by mouth daily.        . sitaGLIPtan (JANUVIA) 100 MG tablet Take 100 mg by mouth daily.        Marland Kitchen telmisartan (MICARDIS) 40 MG tablet Take 40 mg by mouth daily.        Marland Kitchen DISCONTD: furosemide (LASIX) 40 MG tablet Take 40 mg by mouth 2 (two) times daily.          Past Medical History  Diagnosis Date  . Myocardial infarction October 2010    Non-ST-segment-elevation myocardial infarction. Cardiac Catheterization revealed severe  triple-vessel coronary artery disease. He underwent coronary artery bypass grafting on March 14, 2009. At that time, he had a LIMA graft to the LAD as well as saphenous vein graft with to the diagonal and ramus intermediate. He had sequential saphenous vein graft to his RV marginal branch as well as the   . Myocardial infarction     posterior descending branch of the right coronary  artery.  . Hyperlipidemia   . Hypertension   . PVD (peripheral vascular disease)   . Diabetes mellitus   . Heart murmur   . GERD (gastroesophageal reflux disease)     Past Surgical History  Procedure Date  . Amputation     Left great and second toes 2011  . Amputation     Left great and second toes 2011  . Aortic valve replacement     Pericardial tissue valve  . Coronary artery bypass graft 10  . Amputation 07/27/2011    Procedure: AMPUTATION FOOT;  Surgeon: Newt Minion, MD;  Location: [place];  Service: Orthopedics;  Laterality: Left;  Left Midfoot Amputation  ROS:  As stated in the HPI and negative for all other systems.  PHYSICAL EXAM BP 100/70  Pulse 104  Ht 5\' 10"  (1.778 m)  Wt 219 lb (99.338 kg)  BMI 31.42 kg/m2 GENERAL:  Well appearing HEENT:  Pupils equal round and reactive, fundi not visualized, oral mucosa unremarkable NECK:  No jugular venous distention, waveform within normal limits, carotid upstroke brisk and symmetric, no bruits, no thyromegaly LYMPHATICS:  No cervical, inguinal adenopathy LUNGS:  Clear to auscultation bilaterally BACK:  No CVA tenderness CHEST:  Well healed sternotomy scar. HEART:  PMI not displaced or sustained,S1 and S2 within normal limits, no S3, no S4, no clicks, no rubs, soft apical systolic murmur ABD:  Flat, positive bowel sounds normal in frequency in pitch, no bruits, no rebound, no guarding, no midline pulsatile mass, no hepatomegaly, no splenomegaly EXT:  2 plus pulses upper, absent DP/PT left, absent PT right,  no cyanosis no clubbing, mild edema,  s/p bilateral amputations of all of his toes.  SKIN:  No rashes no nodules NEURO:  Cranial nerves II through XII grossly intact, motor grossly intact throughout PSYCH:  Cognitively intact, oriented to person place and time  EKG:  Sinus rhythm, rate 96, left axis deviation, IVCD, no acute ST-T wave changes, lateral ST depression unchanged from previous. 10/16/2011   ASSESSMENT AND PLAN

## 2011-10-16 NOTE — Assessment & Plan Note (Signed)
I will check an echocardiogram as one has not been done since his surgery. Further management will be based on this result.

## 2011-10-16 NOTE — Assessment & Plan Note (Signed)
The blood pressure is at target. No change in medications is indicated. We will continue with therapeutic lifestyle changes (TLC).  

## 2011-10-18 ENCOUNTER — Ambulatory Visit: Payer: BC Managed Care – PPO | Admitting: Cardiology

## 2011-10-23 ENCOUNTER — Ambulatory Visit (HOSPITAL_COMMUNITY): Payer: BC Managed Care – PPO | Attending: Cardiovascular Disease

## 2011-10-23 DIAGNOSIS — I252 Old myocardial infarction: Secondary | ICD-10-CM | POA: Insufficient documentation

## 2011-10-23 DIAGNOSIS — I2510 Atherosclerotic heart disease of native coronary artery without angina pectoris: Secondary | ICD-10-CM | POA: Insufficient documentation

## 2011-10-23 DIAGNOSIS — I253 Aneurysm of heart: Secondary | ICD-10-CM | POA: Insufficient documentation

## 2011-10-23 DIAGNOSIS — I739 Peripheral vascular disease, unspecified: Secondary | ICD-10-CM | POA: Insufficient documentation

## 2011-10-23 DIAGNOSIS — Z954 Presence of other heart-valve replacement: Secondary | ICD-10-CM | POA: Insufficient documentation

## 2011-10-23 DIAGNOSIS — I10 Essential (primary) hypertension: Secondary | ICD-10-CM | POA: Insufficient documentation

## 2011-10-23 DIAGNOSIS — I517 Cardiomegaly: Secondary | ICD-10-CM | POA: Insufficient documentation

## 2011-10-23 DIAGNOSIS — E785 Hyperlipidemia, unspecified: Secondary | ICD-10-CM | POA: Insufficient documentation

## 2011-10-23 DIAGNOSIS — I059 Rheumatic mitral valve disease, unspecified: Secondary | ICD-10-CM

## 2011-10-23 DIAGNOSIS — I359 Nonrheumatic aortic valve disorder, unspecified: Secondary | ICD-10-CM

## 2011-10-23 NOTE — Progress Notes (Signed)
Echocardiogram performed.  

## 2012-11-05 ENCOUNTER — Encounter (INDEPENDENT_AMBULATORY_CARE_PROVIDER_SITE_OTHER): Payer: Self-pay

## 2012-11-05 DIAGNOSIS — R0989 Other specified symptoms and signs involving the circulatory and respiratory systems: Secondary | ICD-10-CM

## 2013-05-22 ENCOUNTER — Telehealth: Payer: Self-pay | Admitting: *Deleted

## 2013-05-22 NOTE — Telephone Encounter (Signed)
Patient was seen in the [facility] on 1/15 15. Patient had labs drawn and K+ was elevated at 5.7. I showed labs to [employer]. He asked me to have patient hold all potassium and have patient recheck labs on Mon.                   May 25, 2013 with primary care physician. I notified patient by phone to stop potassium and left message with primary physician's office. Labs were faxed today with results to primary physician.

## 2013-05-29 NOTE — Telephone Encounter (Signed)
Patient was seen at the [facility] on 05/26/13.  Potassium, serum was 5.3 mmol/L with reference interval 3.5-5.2, results 5.3. Results faxed to primary care physician, Dr [name].

## 2013-08-13 ENCOUNTER — Encounter: Payer: Self-pay | Admitting: Cardiology

## 2013-11-18 ENCOUNTER — Telehealth: Payer: Self-pay

## 2013-11-18 NOTE — Telephone Encounter (Signed)
See telephone note.

## 2013-11-19 ENCOUNTER — Telehealth: Payer: Self-pay | Admitting: *Deleted

## 2013-11-19 NOTE — Telephone Encounter (Signed)
We repeated K+ and was 5.75meQ/L. Patient was notified of lab results. Dr. Lia Foyer asked patient to follow-up with Dr. Percival Spanish with next available appointment. Patient verbalized understanding.

## 2013-12-08 ENCOUNTER — Encounter: Payer: Self-pay | Admitting: Cardiology

## 2014-04-26 ENCOUNTER — Encounter: Payer: Self-pay | Admitting: Cardiology

## 2014-07-27 ENCOUNTER — Other Ambulatory Visit (HOSPITAL_COMMUNITY): Payer: Self-pay

## 2014-07-27 ENCOUNTER — Emergency Department: Payer: Self-pay | Admitting: Emergency Medicine

## 2014-07-27 ENCOUNTER — Inpatient Hospital Stay (HOSPITAL_COMMUNITY)
Admission: EM | Admit: 2014-07-27 | Discharge: 2014-07-31 | DRG: 536 | Disposition: A | Discharge status: Skilled Nursing Facility | Payer: BLUE CROSS/BLUE SHIELD | Attending: Internal Medicine | Admitting: Internal Medicine

## 2014-07-27 ENCOUNTER — Emergency Department (HOSPITAL_COMMUNITY): Payer: BLUE CROSS/BLUE SHIELD

## 2014-07-27 ENCOUNTER — Encounter (HOSPITAL_COMMUNITY): Payer: Self-pay | Admitting: Emergency Medicine

## 2014-07-27 DIAGNOSIS — S72113A Displaced fracture of greater trochanter of unspecified femur, initial encounter for closed fracture: Secondary | ICD-10-CM | POA: Diagnosis present

## 2014-07-27 DIAGNOSIS — S72001D Fracture of unspecified part of neck of right femur, subsequent encounter for closed fracture with routine healing: Secondary | ICD-10-CM

## 2014-07-27 DIAGNOSIS — R262 Difficulty in walking, not elsewhere classified: Secondary | ICD-10-CM | POA: Diagnosis present

## 2014-07-27 DIAGNOSIS — W19XXXA Unspecified fall, initial encounter: Secondary | ICD-10-CM | POA: Diagnosis present

## 2014-07-27 DIAGNOSIS — M25551 Pain in right hip: Secondary | ICD-10-CM | POA: Diagnosis not present

## 2014-07-27 DIAGNOSIS — D696 Thrombocytopenia, unspecified: Secondary | ICD-10-CM | POA: Diagnosis present

## 2014-07-27 DIAGNOSIS — S72009A Fracture of unspecified part of neck of unspecified femur, initial encounter for closed fracture: Secondary | ICD-10-CM | POA: Diagnosis present

## 2014-07-27 DIAGNOSIS — E785 Hyperlipidemia, unspecified: Secondary | ICD-10-CM | POA: Diagnosis present

## 2014-07-27 DIAGNOSIS — D72829 Elevated white blood cell count, unspecified: Secondary | ICD-10-CM | POA: Diagnosis present

## 2014-07-27 DIAGNOSIS — Z7982 Long term (current) use of aspirin: Secondary | ICD-10-CM

## 2014-07-27 DIAGNOSIS — I739 Peripheral vascular disease, unspecified: Secondary | ICD-10-CM | POA: Diagnosis present

## 2014-07-27 DIAGNOSIS — S72111A Displaced fracture of greater trochanter of right femur, initial encounter for closed fracture: Secondary | ICD-10-CM | POA: Diagnosis not present

## 2014-07-27 DIAGNOSIS — S72001A Fracture of unspecified part of neck of right femur, initial encounter for closed fracture: Secondary | ICD-10-CM | POA: Diagnosis present

## 2014-07-27 DIAGNOSIS — R42 Dizziness and giddiness: Secondary | ICD-10-CM

## 2014-07-27 DIAGNOSIS — K219 Gastro-esophageal reflux disease without esophagitis: Secondary | ICD-10-CM | POA: Diagnosis present

## 2014-07-27 DIAGNOSIS — E119 Type 2 diabetes mellitus without complications: Secondary | ICD-10-CM

## 2014-07-27 DIAGNOSIS — Z951 Presence of aortocoronary bypass graft: Secondary | ICD-10-CM

## 2014-07-27 DIAGNOSIS — Z952 Presence of prosthetic heart valve: Secondary | ICD-10-CM

## 2014-07-27 DIAGNOSIS — I252 Old myocardial infarction: Secondary | ICD-10-CM

## 2014-07-27 DIAGNOSIS — I2510 Atherosclerotic heart disease of native coronary artery without angina pectoris: Secondary | ICD-10-CM | POA: Diagnosis present

## 2014-07-27 DIAGNOSIS — Z87891 Personal history of nicotine dependence: Secondary | ICD-10-CM

## 2014-07-27 DIAGNOSIS — Z888 Allergy status to other drugs, medicaments and biological substances status: Secondary | ICD-10-CM

## 2014-07-27 DIAGNOSIS — I10 Essential (primary) hypertension: Secondary | ICD-10-CM | POA: Diagnosis present

## 2014-07-27 LAB — CBC WITH DIFFERENTIAL/PLATELET
Basophils Absolute: 0 10*3/uL (ref 0.0–0.1)
Basophils Relative: 0 % (ref 0–1)
EOS PCT: 0 % (ref 0–5)
Eosinophils Absolute: 0 10*3/uL (ref 0.0–0.7)
HEMATOCRIT: 45.8 % (ref 39.0–52.0)
HEMOGLOBIN: 15.3 g/dL (ref 13.0–17.0)
LYMPHS ABS: 0.9 10*3/uL (ref 0.7–4.0)
LYMPHS PCT: 6 % — AB (ref 12–46)
MCH: 28.7 pg (ref 26.0–34.0)
MCHC: 33.4 g/dL (ref 30.0–36.0)
MCV: 85.9 fL (ref 78.0–100.0)
MONOS PCT: 7 % (ref 3–12)
Monocytes Absolute: 1.1 10*3/uL — ABNORMAL HIGH (ref 0.1–1.0)
Neutro Abs: 13.5 10*3/uL — ABNORMAL HIGH (ref 1.7–7.7)
Neutrophils Relative %: 87 % — ABNORMAL HIGH (ref 43–77)
PLATELETS: 101 10*3/uL — AB (ref 150–400)
RBC: 5.33 MIL/uL (ref 4.22–5.81)
RDW: 13.6 % (ref 11.5–15.5)
WBC: 15.5 10*3/uL — AB (ref 4.0–10.5)

## 2014-07-27 LAB — I-STAT CHEM 8, ED
BUN: 21 mg/dL (ref 6–23)
CALCIUM ION: 1.23 mmol/L (ref 1.12–1.23)
Chloride: 102 mmol/L (ref 96–112)
Creatinine, Ser: 1.1 mg/dL (ref 0.50–1.35)
Glucose, Bld: 220 mg/dL — ABNORMAL HIGH (ref 70–99)
HEMATOCRIT: 48 % (ref 39.0–52.0)
Hemoglobin: 16.3 g/dL (ref 13.0–17.0)
Potassium: 5.1 mmol/L (ref 3.5–5.1)
Sodium: 140 mmol/L (ref 135–145)
TCO2: 23 mmol/L (ref 0–100)

## 2014-07-27 MED ORDER — HYDROMORPHONE HCL 1 MG/ML IJ SOLN
1.0000 mg | Freq: Once | INTRAMUSCULAR | Status: AC
  Administered 2014-07-27: 1 mg via INTRAVENOUS
  Filled 2014-07-27: qty 1

## 2014-07-27 MED ORDER — ONDANSETRON HCL 4 MG/2ML IJ SOLN
4.0000 mg | Freq: Once | INTRAMUSCULAR | Status: AC
  Administered 2014-07-27: 4 mg via INTRAVENOUS
  Filled 2014-07-27: qty 2

## 2014-07-27 NOTE — ED Provider Notes (Signed)
CSN: [id number]     Arrival date & time 07/27/14  2043 History   First MD Initiated Contact with Patient 07/27/14 2152     Chief Complaint  Patient presents with  . Emesis  . Hip Pain     (Consider location/radiation/quality/duration/timing/severity/associated sxs/prior Treatment) Patient is a 59 y.o. male presenting with hip pain.  Hip Pain This is a new problem. The current episode started today. The problem occurs constantly. The problem has been unchanged. Pertinent negatives include no chest pain, congestion, coughing, fever or nausea. Nothing aggravates the symptoms. He has tried nothing for the symptoms. The treatment provided no relief.    Past Medical History  Diagnosis Date  . Myocardial infarction October 2010    Non-ST-segment-elevation myocardial infarction. Cardiac Catheterization revealed severe triple-vessel coronary artery disease. He underwent coronary artery bypass grafting on March 14, 2009. At that time, he had a LIMA graft to the LAD as well as saphenous vein graft with to the diagonal and ramus intermediate. He had sequential saphenous vein graft to his RV marginal branch as well as the   . Myocardial infarction     posterior descending branch of the right coronary  artery.  . Hyperlipidemia   . Hypertension   . PVD (peripheral vascular disease)   . Diabetes mellitus   . Heart murmur   . GERD (gastroesophageal reflux disease)    Past Surgical History  Procedure Laterality Date  . Amputation      Left great and second toes 2011  . Amputation      Left great and second toes 2011  . Aortic valve replacement      Pericardial tissue valve  . Coronary artery bypass graft  10  . Amputation  07/27/2011    Procedure: AMPUTATION FOOT;  Surgeon: Newt Minion, MD;  Location: [facility];  Service: Orthopedics;  Laterality: Left;  Left Midfoot Amputation    Family History  Problem Relation Age of Onset  . Diabetes    . Coronary artery disease    . Heart attack  Father     early age  . Heart attack Brother     At age 51   History  Substance Use Topics  . Smoking status: Former Smoker -- 1 years    Quit date: 10/15/2000  . Smokeless tobacco: Not on file  . Alcohol Use: No     Comment: Rarely    Review of Systems  Constitutional: Negative for fever.  HENT: Negative for congestion.   Respiratory: Negative for cough.   Cardiovascular: Negative for chest pain.  Gastrointestinal: Negative for nausea.  All other systems reviewed and are negative.     Allergies  Ace inhibitors  Home Medications   Prior to Admission medications   Medication Sig Start Date End Date Taking? Authorizing Provider  aspirin 325 MG tablet Take 325 mg by mouth daily.     Yes Historical Provider, MD  calcium-vitamin D (OSCAL WITH D) 500-200 MG-UNIT per tablet Take 1 tablet by mouth daily.   Yes Historical Provider, MD  cetirizine (ZYRTEC) 10 MG tablet Take 10 mg by mouth daily.   Yes Historical Provider, MD  FARXIGA 10 MG TABS tablet Take 10 mg by mouth daily. 07/23/14  Yes Historical Provider, MD  latanoprost (XALATAN) 0.005 % ophthalmic solution Place 1 drop into both eyes at bedtime. 07/13/14  Yes Historical Provider, MD  LEVEMIR FLEXTOUCH 100 UNIT/ML Pen Inject 60 Units into the skin at bedtime. 04/26/14  Yes Historical Provider, MD  losartan (COZAAR) 50 MG tablet Take 50 mg by mouth every morning. 07/19/14  Yes Historical Provider, MD  metoprolol (LOPRESSOR) 50 MG tablet Take 50-100 mg by mouth 2 (two) times daily. Takes 100mg  in amand 50mg  in pm   Yes Historical Provider, MD  Multiple Vitamin (MULTIVITAMIN) tablet Take 1 tablet by mouth daily.     Yes Historical Provider, MD  ondansetron (ZOFRAN) 4 MG tablet Take 4 mg by mouth every 8 (eight) hours as needed for nausea or vomiting.   Yes Historical Provider, MD  oxyCODONE-acetaminophen (PERCOCET/ROXICET) 5-325 MG per tablet Take 1 tablet by mouth every 4 (four) hours as needed for severe pain.   Yes Historical  Provider, MD  pantoprazole (PROTONIX) 40 MG tablet Take 40 mg by mouth daily.     Yes Historical Provider, MD  silver sulfADIAZINE (SILVADENE) 1 % cream Apply 1 application topically as needed. For wound care 07/13/14  Yes Historical Provider, MD  simvastatin (ZOCOR) 20 MG tablet Take 20 mg by mouth daily at 6 PM.    Yes Historical Provider, MD  TANZEUM 50 MG PEN Inject 50 mg into the skin every Monday. 07/23/14  Yes Historical Provider, MD   BP 124/73 mmHg  Pulse 106  Temp(Src) 98.7 F (37.1 C) (Oral)  Resp 23  Ht 5\' 10"  (1.778 m)  Wt 215 lb (97.523 kg)  BMI 30.85 kg/m2  SpO2 95% Physical Exam  Constitutional: He is oriented to person, place, and time. He appears well-developed and well-nourished. No distress.  HENT:  Head: Normocephalic and atraumatic.  Mouth/Throat: Oropharynx is clear and moist. No oropharyngeal exudate.  Eyes: Conjunctivae and EOM are normal. Pupils are equal, round, and reactive to light.  Neck: Normal range of motion. Neck supple.  Cardiovascular: Normal rate, regular rhythm, normal heart sounds and intact distal pulses.  Exam reveals no gallop and no friction rub.   No murmur heard. Pulmonary/Chest: Effort normal and breath sounds normal. No respiratory distress. He has no wheezes. He has no rales.  Abdominal: Soft. He exhibits no distension and no mass. There is no tenderness. There is no rebound and no guarding.  Musculoskeletal: He exhibits tenderness. He exhibits no edema.  Extreme pain with flexion of the right hip. Point tenderness over the right lateral hip. Intact strength of the right lower extremity with normal sensation and 2+ DP and PT pulses. Unable to ambulate secondary to pain.  Lymphadenopathy:    He has no cervical adenopathy.  Neurological: He is alert and oriented to person, place, and time. No cranial nerve deficit.  Skin: Skin is warm and dry. No rash noted. He is not diaphoretic.  Psychiatric: He has a normal mood and affect. His behavior is  normal. Judgment and thought content normal.  Nursing note and vitals reviewed.   ED Course  Procedures (including critical care time) Labs Review Labs Reviewed  CBC WITH DIFFERENTIAL/PLATELET - Abnormal; Notable for the following:    WBC 15.5 (*)    Platelets 101 (*)    Neutrophils Relative % 87 (*)    Neutro Abs 13.5 (*)    Lymphocytes Relative 6 (*)    Monocytes Absolute 1.1 (*)    All other components within normal limits  I-STAT CHEM 8, ED - Abnormal; Notable for the following:    Glucose, Bld 220 (*)    All other components within normal limits  PROTIME-INR  BASIC METABOLIC PANEL  TYPE AND SCREEN    Imaging Review Ct Head Wo Contrast  07/27/2014  CLINICAL DATA:  Acute onset of dizziness and fall. Vomiting. Initial encounter.  EXAM: CT HEAD WITHOUT CONTRAST  TECHNIQUE: Contiguous axial images were obtained from the base of the skull through the vertex without intravenous contrast.  COMPARISON:  None.  FINDINGS: There is no evidence of acute infarction, mass lesion, or intra- or extra-axial hemorrhage on CT.  Prominence of the ventricles and sulci reflects mild cortical volume loss. Mild cerebellar atrophy is noted. Mild periventricular white matter change likely reflects small vessel ischemic microangiopathy.  The brainstem and fourth ventricle are within normal limits. The basal ganglia are unremarkable in appearance. The cerebral hemispheres demonstrate grossly normal gray-white differentiation. No mass effect or midline shift is seen.  There is no evidence of fracture; visualized osseous structures are unremarkable in appearance. The orbits are within normal limits. Mucosal thickening is noted at the maxillary sinuses bilaterally. The remaining paranasal sinuses and mastoid air cells are well-aerated. No significant soft tissue abnormalities are seen.  IMPRESSION: 1. No evidence of traumatic intracranial injury or fracture. 2. Mild cortical volume loss and scattered small vessel  ischemic microangiopathy. 3. Mucosal thickening at the maxillary sinuses bilaterally.   Electronically Signed   By: Garald Balding M.D.   On: 07/27/2014 23:58   Dg Hip Unilat With Pelvis 2-3 Views Right  07/27/2014   CLINICAL DATA:  Status post fall at [employer]. Right hip pain. Initial encounter.  EXAM: RIGHT HIP (WITH PELVIS) 2-3 VIEWS  COMPARISON:  None.  FINDINGS: There appears to be a mildly displaced fracture involving the right greater femoral trochanter, without extension into the intertrochanteric region. The fragment is relatively prominent, though given its location, this likely reflects an acute avulsion injury.  The right hip joint is unremarkable in appearance. There is slight flattening of the left femoral head, degenerative in nature. Mild degenerative change is noted at the lower lumbar spine.  Diffuse vascular calcifications are seen. The sacroiliac joints are grossly unremarkable, aside from mild sclerotic change. The visualized bowel gas pattern is within normal limits.  IMPRESSION: 1. Mildly displaced fracture involving the right greater femoral trochanter, without extension into the intertrochanteric region. The fragment is relatively prominent, though given its location, this likely reflects an acute avulsion injury. 2. Diffuse vascular calcifications seen.   Electronically Signed   By: Garald Balding M.D.   On: 07/27/2014 23:52   Dg Femur, Min 2 Views Right  07/27/2014   CLINICAL DATA:  Status post fall at [facility], with right thigh pain. Initial encounter.  EXAM: RIGHT FEMUR 2 VIEWS  COMPARISON:  None.  FINDINGS: There appears to be a mildly displaced fracture involving the right greater femoral trochanter, without evidence of intertrochanteric extension. The right femoral head remains seated at the acetabulum. The right knee joint is grossly unremarkable. No knee joint effusion is identified.  Scattered vascular calcifications are seen.  IMPRESSION: 1. Mildly displaced fracture  involving the right greater femoral trochanter, without evidence of intertrochanteric extension. 2. Scattered vascular calcifications seen.   Electronically Signed   By: Garald Balding M.D.   On: 07/27/2014 23:47     EKG Interpretation None      MDM   Final diagnoses:  Right hip pain  Greater trochanter fracture, right, closed, initial encounter    59 year old male presents with right hip pain after fall. He became dizzy and fell to his right side [employer]. No loss of consciousness and did not hit his head. No coinciding chest pain or shortness of breath. No symptoms since that time  but has had persistent right hip pain. Unable to walk. He was evaluated at an outside hospital where he supposedly had negative plain films and was discharged home. Due to continued inability to walk, presents today.  Significant pain with flexion of the right hip. Point tenderness over the right lateral hip. Plain films show greater trochanter fracture. Unable to ambulate due to pain with weightbearing. Discussed with orthopedics and we'll obtain CT of the right hip. Discussed with hospitalist and will admit to the floor. If CT shows intertrochanteric fracture, Orthopedics to be notified.  Larence Penning, MD 07/28/14 0107  Elnora Morrison, MD 07/31/14 [phone]

## 2014-07-27 NOTE — ED Notes (Signed)
Patient family request to be contact once despo has be decided in case she needs to come get the patient, Patient family number is [place], Patient niece.

## 2014-07-27 NOTE — ED Notes (Signed)
Patient family states they went to [facility] today due to a fall, family states patient only received a x-ray. Family states patient needs to be evaluated for why he can not walk. CT  Needs to be done to see if he has had a stroke". This nurse asked the family if vomiting the reason they came today and family stated no. Family states they want patient to worked up for stroke."

## 2014-07-27 NOTE — ED Notes (Signed)
Pt st's he was at [facility] earlier today and became dizzy and fell.  St's he is unsure if he hit his head.  Pt c/o pain in right hip and leg. Unable to bear wt.  Also st's he has been vomiting since fall

## 2014-07-28 ENCOUNTER — Inpatient Hospital Stay (HOSPITAL_COMMUNITY): Payer: BLUE CROSS/BLUE SHIELD

## 2014-07-28 ENCOUNTER — Encounter (HOSPITAL_COMMUNITY): Payer: Self-pay | Admitting: Radiology

## 2014-07-28 DIAGNOSIS — K219 Gastro-esophageal reflux disease without esophagitis: Secondary | ICD-10-CM | POA: Diagnosis present

## 2014-07-28 DIAGNOSIS — E785 Hyperlipidemia, unspecified: Secondary | ICD-10-CM | POA: Diagnosis present

## 2014-07-28 DIAGNOSIS — S72111A Displaced fracture of greater trochanter of right femur, initial encounter for closed fracture: Principal | ICD-10-CM

## 2014-07-28 DIAGNOSIS — I739 Peripheral vascular disease, unspecified: Secondary | ICD-10-CM | POA: Diagnosis present

## 2014-07-28 DIAGNOSIS — S72001A Fracture of unspecified part of neck of right femur, initial encounter for closed fracture: Secondary | ICD-10-CM | POA: Diagnosis present

## 2014-07-28 DIAGNOSIS — Z87891 Personal history of nicotine dependence: Secondary | ICD-10-CM | POA: Diagnosis not present

## 2014-07-28 DIAGNOSIS — M25551 Pain in right hip: Secondary | ICD-10-CM | POA: Diagnosis present

## 2014-07-28 DIAGNOSIS — E119 Type 2 diabetes mellitus without complications: Secondary | ICD-10-CM | POA: Diagnosis present

## 2014-07-28 DIAGNOSIS — I252 Old myocardial infarction: Secondary | ICD-10-CM | POA: Diagnosis not present

## 2014-07-28 DIAGNOSIS — D72829 Elevated white blood cell count, unspecified: Secondary | ICD-10-CM | POA: Diagnosis present

## 2014-07-28 DIAGNOSIS — Z7982 Long term (current) use of aspirin: Secondary | ICD-10-CM | POA: Diagnosis not present

## 2014-07-28 DIAGNOSIS — K210 Gastro-esophageal reflux disease with esophagitis: Secondary | ICD-10-CM

## 2014-07-28 DIAGNOSIS — D696 Thrombocytopenia, unspecified: Secondary | ICD-10-CM | POA: Diagnosis present

## 2014-07-28 DIAGNOSIS — I10 Essential (primary) hypertension: Secondary | ICD-10-CM

## 2014-07-28 DIAGNOSIS — I2510 Atherosclerotic heart disease of native coronary artery without angina pectoris: Secondary | ICD-10-CM | POA: Diagnosis present

## 2014-07-28 DIAGNOSIS — S72113A Displaced fracture of greater trochanter of unspecified femur, initial encounter for closed fracture: Secondary | ICD-10-CM | POA: Diagnosis present

## 2014-07-28 DIAGNOSIS — Z952 Presence of prosthetic heart valve: Secondary | ICD-10-CM | POA: Diagnosis not present

## 2014-07-28 DIAGNOSIS — S72111D Displaced fracture of greater trochanter of right femur, subsequent encounter for closed fracture with routine healing: Secondary | ICD-10-CM | POA: Diagnosis not present

## 2014-07-28 DIAGNOSIS — W19XXXA Unspecified fall, initial encounter: Secondary | ICD-10-CM | POA: Diagnosis present

## 2014-07-28 DIAGNOSIS — Z954 Presence of other heart-valve replacement: Secondary | ICD-10-CM

## 2014-07-28 DIAGNOSIS — S72001D Fracture of unspecified part of neck of right femur, subsequent encounter for closed fracture with routine healing: Secondary | ICD-10-CM

## 2014-07-28 DIAGNOSIS — Z951 Presence of aortocoronary bypass graft: Secondary | ICD-10-CM | POA: Diagnosis not present

## 2014-07-28 DIAGNOSIS — S72009A Fracture of unspecified part of neck of unspecified femur, initial encounter for closed fracture: Secondary | ICD-10-CM | POA: Diagnosis present

## 2014-07-28 DIAGNOSIS — Z888 Allergy status to other drugs, medicaments and biological substances status: Secondary | ICD-10-CM | POA: Diagnosis not present

## 2014-07-28 DIAGNOSIS — R262 Difficulty in walking, not elsewhere classified: Secondary | ICD-10-CM | POA: Diagnosis present

## 2014-07-28 LAB — LIPID PANEL
CHOLESTEROL: 104 mg/dL (ref 0–200)
HDL: 34 mg/dL — ABNORMAL LOW (ref >39)
LDL Cholesterol: 56 mg/dL (ref 0–99)
TRIGLYCERIDES: 69 mg/dL (ref <150)
Total CHOL/HDL Ratio: 3.1 RATIO
VLDL: 14 mg/dL (ref 0–40)

## 2014-07-28 LAB — CBC
HCT: 40.7 % (ref 39.0–52.0)
Hemoglobin: 13.4 g/dL (ref 13.0–17.0)
MCH: 28.3 pg (ref 26.0–34.0)
MCHC: 32.9 g/dL (ref 30.0–36.0)
MCV: 85.9 fL (ref 78.0–100.0)
Platelets: 94 10*3/uL — ABNORMAL LOW (ref 150–400)
RBC: 4.74 MIL/uL (ref 4.22–5.81)
RDW: 13.8 % (ref 11.5–15.5)
WBC: 12.4 10*3/uL — ABNORMAL HIGH (ref 4.0–10.5)

## 2014-07-28 LAB — BASIC METABOLIC PANEL
Anion gap: 5 (ref 5–15)
Anion gap: 4 — ABNORMAL LOW (ref 5–15)
BUN: 20 mg/dL (ref 6–23)
BUN: 21 mg/dL (ref 6–23)
CALCIUM: 8.5 mg/dL (ref 8.4–10.5)
CHLORIDE: 107 mmol/L (ref 96–112)
CO2: 27 mmol/L (ref 19–32)
CO2: 27 mmol/L (ref 19–32)
CREATININE: 1.24 mg/dL (ref 0.50–1.35)
Calcium: 9.2 mg/dL (ref 8.4–10.5)
Chloride: 109 mmol/L (ref 96–112)
Creatinine, Ser: 1.27 mg/dL (ref 0.50–1.35)
GFR calc Af Amer: 71 mL/min — ABNORMAL LOW (ref >90)
GFR calc Af Amer: 73 mL/min — ABNORMAL LOW (ref >90)
GFR calc non Af Amer: 61 mL/min — ABNORMAL LOW (ref >90)
GFR calc non Af Amer: 63 mL/min — ABNORMAL LOW (ref >90)
GLUCOSE: 177 mg/dL — AB (ref 70–99)
GLUCOSE: 213 mg/dL — AB (ref 70–99)
Potassium: 4.8 mmol/L (ref 3.5–5.1)
Potassium: 5 mmol/L (ref 3.5–5.1)
Sodium: 139 mmol/L (ref 135–145)
Sodium: 140 mmol/L (ref 135–145)

## 2014-07-28 LAB — URINALYSIS, ROUTINE W REFLEX MICROSCOPIC
BILIRUBIN URINE: NEGATIVE
Ketones, ur: 15 mg/dL — AB
Leukocytes, UA: NEGATIVE
Nitrite: NEGATIVE
PH: 6 (ref 5.0–8.0)
Protein, ur: 100 mg/dL — AB
SPECIFIC GRAVITY, URINE: 1.033 — AB (ref 1.005–1.030)
Urobilinogen, UA: 0.2 mg/dL (ref 0.0–1.0)

## 2014-07-28 LAB — TROPONIN I
Troponin I: 0.03 ng/mL (ref <0.031)
Troponin I: <0.03 ng/mL (ref <0.031)
Troponin I: 0.03 ng/mL (ref <0.031)

## 2014-07-28 LAB — URINE MICROSCOPIC-ADD ON

## 2014-07-28 LAB — PROTIME-INR
INR: 1.17 (ref 0.00–1.49)
Prothrombin Time: 15 seconds (ref 11.6–15.2)

## 2014-07-28 LAB — TYPE AND SCREEN
ABO/RH(D): A POS
Antibody Screen: NEGATIVE

## 2014-07-28 LAB — GLUCOSE, CAPILLARY
Glucose-Capillary: 185 mg/dL — ABNORMAL HIGH (ref 70–99)
Glucose-Capillary: 142 mg/dL — ABNORMAL HIGH (ref 70–99)
Glucose-Capillary: 139 mg/dL — ABNORMAL HIGH (ref 70–99)

## 2014-07-28 LAB — MRSA PCR SCREENING: MRSA BY PCR: NEGATIVE

## 2014-07-28 LAB — APTT: aPTT: 30 seconds (ref 24–37)

## 2014-07-28 MED ORDER — CETYLPYRIDINIUM CHLORIDE 0.05 % MT LIQD
7.0000 mL | Freq: Two times a day (BID) | OROMUCOSAL | Status: DC
  Administered 2014-07-28 – 2014-07-31 (×7): 7 mL via OROMUCOSAL

## 2014-07-28 MED ORDER — ACETAMINOPHEN 325 MG PO TABS: 650.0000 mg | ORAL_TABLET | Freq: Four times a day (QID) | ORAL | Status: DC | PRN

## 2014-07-28 MED ORDER — MORPHINE SULFATE 2 MG/ML IJ SOLN
2.0000 mg | INTRAMUSCULAR | Status: DC | PRN
  Administered 2014-07-29 (×2): 2 mg via INTRAVENOUS
  Filled 2014-07-28 (×2): qty 1

## 2014-07-28 MED ORDER — LORATADINE 10 MG PO TABS
10.0000 mg | ORAL_TABLET | Freq: Every day | ORAL | Status: DC
  Administered 2014-07-28 – 2014-07-31 (×4): 10 mg via ORAL
  Filled 2014-07-28 (×4): qty 1

## 2014-07-28 MED ORDER — INSULIN DETEMIR 100 UNIT/ML ~~LOC~~ SOLN
45.0000 [IU] | Freq: Every day | SUBCUTANEOUS | Status: DC
  Administered 2014-07-28 – 2014-07-30 (×3): 45 [IU] via SUBCUTANEOUS
  Filled 2014-07-28 (×5): qty 0.45

## 2014-07-28 MED ORDER — METOPROLOL TARTRATE 100 MG PO TABS
100.0000 mg | ORAL_TABLET | Freq: Every day | ORAL | Status: DC
  Administered 2014-07-28 – 2014-07-31 (×4): 100 mg via ORAL
  Filled 2014-07-28 (×4): qty 1

## 2014-07-28 MED ORDER — ALUM & MAG HYDROXIDE-SIMETH 200-200-20 MG/5ML PO SUSP: 30.0000 mL | Freq: Four times a day (QID) | ORAL | Status: DC | PRN

## 2014-07-28 MED ORDER — CALCIUM CARBONATE-VITAMIN D 500-200 MG-UNIT PO TABS
1.0000 | ORAL_TABLET | Freq: Every day | ORAL | Status: DC
  Administered 2014-07-29 – 2014-07-31 (×3): 1 via ORAL
  Filled 2014-07-28 (×5): qty 1

## 2014-07-28 MED ORDER — ADULT MULTIVITAMIN W/MINERALS CH
1.0000 | ORAL_TABLET | Freq: Every day | ORAL | Status: DC
  Administered 2014-07-28 – 2014-07-31 (×4): 1 via ORAL
  Filled 2014-07-28 (×4): qty 1

## 2014-07-28 MED ORDER — METOPROLOL TARTRATE 50 MG PO TABS
50.0000 mg | ORAL_TABLET | Freq: Every day | ORAL | Status: DC
  Administered 2014-07-28 – 2014-07-30 (×4): 50 mg via ORAL
  Filled 2014-07-28 (×5): qty 1

## 2014-07-28 MED ORDER — METOPROLOL TARTRATE 50 MG PO TABS: 50.0000 mg | ORAL_TABLET | Freq: Two times a day (BID) | ORAL | Status: DC

## 2014-07-28 MED ORDER — HEPARIN SODIUM (PORCINE) 5000 UNIT/ML IJ SOLN
5000.0000 [IU] | Freq: Three times a day (TID) | INTRAMUSCULAR | Status: DC
Start: 2014-07-28 — End: 2014-07-31
  Administered 2014-07-28 – 2014-07-31 (×9): 5000 [IU] via SUBCUTANEOUS
  Filled 2014-07-28 (×13): qty 1

## 2014-07-28 MED ORDER — SODIUM CHLORIDE 0.9 % IJ SOLN
3.0000 mL | Freq: Two times a day (BID) | INTRAMUSCULAR | Status: DC
  Administered 2014-07-28 – 2014-07-31 (×5): 3 mL via INTRAVENOUS

## 2014-07-28 MED ORDER — ASPIRIN 325 MG PO TABS
325.0000 mg | ORAL_TABLET | Freq: Every day | ORAL | Status: DC
  Administered 2014-07-28 – 2014-07-31 (×4): 325 mg via ORAL
  Filled 2014-07-28 (×4): qty 1

## 2014-07-28 MED ORDER — ACETAMINOPHEN 650 MG RE SUPP: 650.0000 mg | Freq: Four times a day (QID) | RECTAL | Status: DC | PRN

## 2014-07-28 MED ORDER — LOSARTAN POTASSIUM 50 MG PO TABS
50.0000 mg | ORAL_TABLET | Freq: Every morning | ORAL | Status: DC
  Administered 2014-07-28 – 2014-07-31 (×4): 50 mg via ORAL
  Filled 2014-07-28 (×4): qty 1

## 2014-07-28 MED ORDER — PANTOPRAZOLE SODIUM 40 MG PO TBEC
40.0000 mg | DELAYED_RELEASE_TABLET | Freq: Every day | ORAL | Status: DC
  Administered 2014-07-28 – 2014-07-31 (×4): 40 mg via ORAL
  Filled 2014-07-28: qty 1

## 2014-07-28 MED ORDER — SILVER SULFADIAZINE 1 % EX CREA
1.0000 "application " | TOPICAL_CREAM | Freq: Every day | CUTANEOUS | Status: DC
  Administered 2014-07-30: 1 via TOPICAL
  Filled 2014-07-28: qty 85

## 2014-07-28 MED ORDER — SODIUM CHLORIDE 0.9 % IV BOLUS (SEPSIS)
1000.0000 mL | Freq: Once | INTRAVENOUS | Status: AC
  Administered 2014-07-28: 1000 mL via INTRAVENOUS

## 2014-07-28 MED ORDER — OXYCODONE-ACETAMINOPHEN 5-325 MG PO TABS
1.0000 | ORAL_TABLET | ORAL | Status: DC | PRN
  Administered 2014-07-28 – 2014-07-30 (×5): 1 via ORAL
  Filled 2014-07-28 (×5): qty 1

## 2014-07-28 MED ORDER — ONDANSETRON HCL 4 MG/2ML IJ SOLN
4.0000 mg | Freq: Three times a day (TID) | INTRAMUSCULAR | Status: DC | PRN
  Administered 2014-07-28 (×2): 4 mg via INTRAVENOUS
  Filled 2014-07-28 (×2): qty 2

## 2014-07-28 MED ORDER — HYDROMORPHONE HCL 1 MG/ML IJ SOLN
0.5000 mg | INTRAMUSCULAR | Status: DC | PRN
  Administered 2014-07-28: 0.5 mg via INTRAVENOUS
  Filled 2014-07-28: qty 1

## 2014-07-28 MED ORDER — SODIUM CHLORIDE 0.9 % IV SOLN
INTRAVENOUS | Status: DC
  Administered 2014-07-28 – 2014-07-29 (×3): via INTRAVENOUS

## 2014-07-28 MED ORDER — SIMVASTATIN 20 MG PO TABS
20.0000 mg | ORAL_TABLET | Freq: Every day | ORAL | Status: DC
  Administered 2014-07-28 – 2014-07-30 (×3): 20 mg via ORAL
  Filled 2014-07-28 (×4): qty 1

## 2014-07-28 NOTE — Evaluation (Signed)
Occupational Therapy Evaluation Patient Details Name: Jayion Favinger MRN: [id number] DOB: 04/28/1956 Today's Date: 07/28/2014    History of Present Illness Pt is a 59 y.o. male with PMH of HTN, hyperlipidemia, GERD, CAD (s/p of CABG), aortic valve replacement with bovine valve, amputation of all toes, PVD, and DM, who presents with right hip pain after fall. Patient reports that he was in [place] shopping at about 12:30. He suddenly felt flushed, faint, and dizzy and then fell on the ground. He injured his right hip and developed pain over the right hip. He could not put weight on the right leg due to pain. His dizziness lasted for about 3-4 minutes. X-ray of right hip showed mildly displaced fracture involving the right greater femoral trochanter, without extension into the intertrochanteric region. Orthopedic surgeons recommended against surgery at this time.   Clinical Impression   Pt currently min assist overall for selfcare tasks and functional transfers.  He demonstrates some increased pain with weightbearing but it is tolerable.  Decreased ability to reach his RLE for dressing tasks as well.  Feel he will benefit from acute care OT to help increase overall independence.  Also feel he will benefit from short CIR stay as well to progress to modified independent level.      Follow Up Recommendations  CIR;Supervision - Intermittent    Equipment Recommendations  Tub/shower bench       Precautions / Restrictions Precautions Precautions: Fall Precaution Comments: NO ABDUCTION Restrictions Weight Bearing Restrictions: Yes RLE Weight Bearing: Weight bearing as tolerated      Mobility Bed Mobility Overal bed mobility: Needs Assistance Bed Mobility: Supine to Sit     Supine to sit: Min assist     General bed mobility comments: Assist for movement and support of the RLE during transition to EOB. Pt was able to scoot and use bed rails for support. Bed pad utilized for completing  full scoot to EOB.   Transfers Overall transfer level: Needs assistance Equipment used: Rolling walker (2 wheeled) Transfers: Stand Pivot Transfers Sit to Stand: Min assist Stand pivot transfers: Min assist       General transfer comment: Pt able to ambulate to the door with the RW and overall min assist level.     Balance Overall balance assessment: Needs assistance Sitting-balance support: Feet supported;No upper extremity supported Sitting balance-Leahy Scale: Good     Standing balance support: Bilateral upper extremity supported;During functional activity Standing balance-Leahy Scale: Poor                              ADL Overall ADL's : Needs assistance/impaired Eating/Feeding: Independent;Sitting   Grooming: Wash/dry hands;Wash/dry face;Minimal assistance (standing)   Upper Body Bathing: Set up;Sitting   Lower Body Bathing: Minimal assistance;Sit to/from stand   Upper Body Dressing : Set up;Sitting   Lower Body Dressing: Minimal assistance   Toilet Transfer: Minimal assistance;BSC;RW   Toileting- Clothing Manipulation and Hygiene: Minimal assistance;Sit to/from stand       Functional mobility during ADLs: Minimal assistance General ADL Comments: Pt currently min assist level for selfcare tasks and functional transfers. Decreased ability to reach his LLE for donning socks or shoes but is able to tie them once donned.  Feel he will benefit from short term CIR level therapies as he lives alone.     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  RLE: Unable to fully assess due to pain   Cervical / Trunk Assessment Cervical / Trunk Assessment: Normal   Communication Communication Communication: No difficulties   Cognition Arousal/Alertness: Awake/alert Behavior During Therapy: WFL for tasks assessed/performed Overall Cognitive Status: Within Functional Limits for tasks assessed                                Home Living Family/patient expects to be discharged to:: Private residence Living Arrangements: Alone Available Help at Discharge: Family;Available PRN/intermittently Type of Home: House Home Access: Ramped entrance     Home Layout: One level     Bathroom Shower/Tub: Tub/shower unit;Curtain   Biochemist, clinical: Standard     Home Equipment: Environmental consultant - 2 wheels;Cane - single point;Crutches;Shower seat;Bedside commode   Additional Comments: Brother may have a tub bench that pt can use. Pt feels that he may be able to work out 24 hour assistance at d/c when he gets home.      Prior Functioning/Environment Level of Independence: Independent with assistive device(s)        Comments: Pt states he uses a cane for ambulation and did not require assist for bathing/dressing PTA.     OT Diagnosis: Generalized weakness;Acute pain   OT Problem List: Decreased strength;Decreased activity tolerance;Decreased range of motion;Impaired balance (sitting and/or standing);Pain;Decreased knowledge of use of DME or AE   OT Treatment/Interventions: Self-care/ADL training;Patient/family education;Therapeutic activities;DME and/or AE instruction;Balance training;Neuromuscular education    OT Goals(Current goals  can be found in the care plan section) Acute Rehab OT Goals Patient Stated Goal: Pt did not state this session but was agreeable to participating in therapy. OT Goal Formulation: With patient Time For Goal Achievement: 08/04/14 Potential to Achieve Goals: Good  OT Frequency: Min 2X/week   Barriers to D/C: Decreased caregiver support             End of Session Equipment Utilized During Treatment: Gait belt;Rolling walker Nurse Communication: Mobility status  Activity Tolerance: Patient tolerated treatment well Patient left: in chair   Time: 1120-1203 OT Time Calculation (min): 43 min Charges:  OT General Charges $OT Visit: 1 Procedure OT Treatments $Self Care/Home Management : 23-37 mins  Cabella Kimm OTR/L 07/28/2014, 1:31 PM

## 2014-07-28 NOTE — Progress Notes (Signed)
Pt is NPO, paged Dr. Sheran Fava does he want to give diet order.  Sx consulted this am.

## 2014-07-28 NOTE — Progress Notes (Addendum)
[facility] PROGRESS NOTE  [name] [id number] DOB: 1956-04-11 DOA: 07/27/2014 PCP: Everlean Alstrom, MD  Brief Summary  [name] is a 59 y.o. male with past medical history of hypertension, hyperlipidemia, GERD, coronary artery disease (s/p of CABG), aortic valve replacement with bovine valve, amputation of all toes, PVD, diabetes mellitus, who presents with right hip pain after fall.   Patient reports that he was in [place] shopping at about 12:30. He suddenly felt flushed, faint, and dizzy and then fell on the ground. He injured his right hip and developed pain over the right hip. He could not put weight on the right leg due to pain. His dizziness lasted for about 3-4 minutes. He did not lose consciousness. He had episode of nausea, bu no vomiting, diarrhea or abdominal pain. He denied vision changes, ear ringing, chest pain, shortness breath, palpitation, headache, unilateral weakness, numbness or tingling sensations. Patient denied fever, chills, cough, abdominal pain, diarrhea, constipation, dysuria, urgency, frequency, hematuria.   In ED, patient was found to have WBC 15.5, temperature normal, tachycardia, electrolytes okay, thrombocytopenia with platelet 101. CT head is negative for acute abnormalities. X-ray of right hip showed mildly displaced fracture involving the right greater femoral trochanter, without extension into the intertrochanteric region. Patient was admitted to inpatient for further evaluation and treatment. Orthopedic surgeons recommended against surgery at this time.  He is currently undergoing evaluation by PT/OT and further testing for cause of his presyncope.  Assessment/Plan  Right hip fracture, avulsion fracture of the greater trochanter:  - Pain control: percocet prn - Orthopedics evaluated and recommended WBAT and follow up with Orthopedics in 2 weeks for repeat XR -  PT/OT assessments  Fall: Patient had episode of dizziness  before having fall. No other symptoms, such as chest pain, palpitation or any signs of stroke. Given his significant history of cardiac issues, including CAD with s/p of CABG and aortic valve replacement, will evaluated his heart little further. - trop x 3:  neg - ekg:  Sinus tach with Q-waves in anterior leads -  Tele:  NSR - echo:  Pending -  Orthostatics deferred initially and now hydrated -  Check orthostatics now -  CXR  -  UA neg for signs of infection  CAD: s/p of CABG. No chest pain -see above -Continue aspirin, metoprolol, Zocor  Leukocytosis: It is most likely due to stress-induced demargination, but patient has some skin tears with erythematous skin change over left shin. He also has thrombocytopenia, infection can not be completely ruled out. - blood culture x 2 pending - resolving -continue home topical silver sulfadiazine cream  Diabetes mellitus: A1c was 6.1 on 03/27/09. She is on Levemir, Farxiga and Tanzeum at home -  CBG stable -continue levemir 45 units daily -SSI -A1c pending  Hypertension: -Continue Cozaar and metoprolol  GERD: -Protonix  Hyperlipidemia: LDL was a 45 on 03/01/09 -Continue Zocor -Check FLP  Lab Results  Component Value Date   CHOL 104 07/28/2014   HDL 34* 07/28/2014   LDLCALC 56 07/28/2014   TRIG 69 07/28/2014   CHOLHDL 3.1 07/28/2014   Nocturnal hypoxemia -  Needs outpatient sleep study  Diet:  diabetic Access:  PIV IVF:  off Proph:  heparin  Code Status: full Family Communication: patient and his brother Disposition Plan: pending PT/OT evals, ECHO, CXR, likely d/c tomorrow   Consultants:  Orthopedics, Dr. Rolena Infante  Procedures:  3 pelvis  CT head  CT hip without contrast, right  Antibiotics:  None  HPI/Subjective:  Feeling better today.  Having pain in the right hip.  Continues to deny fevers, chills, cough, dysuria, vomiting, diarrhea  Objective: Filed Vitals:   07/28/14 0200 07/28/14 0305 07/28/14  0434 07/28/14 1001  BP: 116/68 143/75 133/65 143/72  Pulse: 102 107 102 91  Temp:  98 F (36.7 C) 97.8 F (36.6 C) 98.2 F (36.8 C)  TempSrc:  Oral Oral Oral  Resp: 16 18 16 17   Height:  5\' 10"  (1.778 m)    Weight:  95.3 kg (210 lb 1.6 oz)    SpO2: 98% 100% 98% 100%   No intake or output data in the 24 hours ending 07/28/14 1013 Filed Weights   07/27/14 2052 07/28/14 0305  Weight: 97.523 kg (215 lb) 95.3 kg (210 lb 1.6 oz)    Exam:   General:  Adult male, No acute distress  HEENT:  NCAT, MMM  Cardiovascular:  RRR, nl S1, S2, 3/6 systolic murmur, no rubs or gallops, 2+ radial pulses, warm extremities  Respiratory:  CTAB, no increased WOB  Abdomen:   NABS, soft, NT/ND  MSK:   Normal tone and bulk, 1+ bilateral pitting LEE  Neuro:   Strength 5/5 bilatlera upper extremities and left lower extremity.  Knee and hip RLE limited by pain.  Ankle 5/5.    Data Reviewed: Basic Metabolic Panel:  Recent Labs Lab 07/27/14 2142 07/28/14 0052 07/28/14 0522  NA 140 139 140  K 5.1 4.8 5.0  CL 102 107 109  CO2  --  27 27  GLUCOSE 220* 213* 177*  BUN 21 20 21   CREATININE 1.10 1.27 1.24  CALCIUM  --  9.2 8.5   Liver Function Tests: No results for input(s): AST, ALT, ALKPHOS, BILITOT, PROT, ALBUMIN in the last 168 hours. No results for input(s): LIPASE, AMYLASE in the last 168 hours. No results for input(s): AMMONIA in the last 168 hours. CBC:  Recent Labs Lab 07/27/14 2133 07/27/14 2142 07/28/14 0522  WBC 15.5*  --  12.4*  NEUTROABS 13.5*  --   --   HGB 15.3 16.3 13.4  HCT 45.8 48.0 40.7  MCV 85.9  --  85.9  PLT 101*  --  94*   Cardiac Enzymes:  Recent Labs Lab 07/28/14 0522  TROPONINI 0.03   BNP (last 3 results) No results for input(s): BNP in the last 8760 hours.  ProBNP (last 3 results) No results for input(s): PROBNP in the last 8760 hours.  CBG:  Recent Labs Lab 07/28/14 0413 07/28/14 0805  GLUCAP 185* 142*    Recent Results (from the past  240 hour(s))  MRSA PCR Screening     Status: None   Collection Time: 07/28/14  4:33 AM  Result Value Ref Range Status   MRSA by PCR NEGATIVE NEGATIVE Final    Comment:        The GeneXpert MRSA Assay (FDA approved for NASAL specimens only), is one component of a comprehensive MRSA colonization surveillance program. It is not intended to diagnose MRSA infection nor to guide or monitor treatment for MRSA infections.      Studies: Ct Head Wo Contrast  07/27/2014   CLINICAL DATA:  Acute onset of dizziness and fall. Vomiting. Initial encounter.  EXAM: CT HEAD WITHOUT CONTRAST  TECHNIQUE: Contiguous axial images were obtained from the base of the skull through the vertex without intravenous contrast.  COMPARISON:  None.  FINDINGS: There is no evidence of acute infarction, mass lesion, or intra- or extra-axial hemorrhage on CT.  Prominence of the ventricles and sulci reflects mild cortical volume loss. Mild cerebellar atrophy is noted. Mild periventricular white matter change likely reflects small vessel ischemic microangiopathy.  The brainstem and fourth ventricle are within normal limits. The basal ganglia are unremarkable in appearance. The cerebral hemispheres demonstrate grossly normal gray-white differentiation. No mass effect or midline shift is seen.  There is no evidence of fracture; visualized osseous structures are unremarkable in appearance. The orbits are within normal limits. Mucosal thickening is noted at the maxillary sinuses bilaterally. The remaining paranasal sinuses and mastoid air cells are well-aerated. No significant soft tissue abnormalities are seen.  IMPRESSION: 1. No evidence of traumatic intracranial injury or fracture. 2. Mild cortical volume loss and scattered small vessel ischemic microangiopathy. 3. Mucosal thickening at the maxillary sinuses bilaterally.   Electronically Signed   By: Garald Balding M.D.   On: 07/27/2014 23:58   Ct Hip Right Wo Contrast  07/28/2014    CLINICAL DATA:  Evaluate known right greater femoral trochanter fracture. Status post fall, with right hip pain. Initial encounter.  EXAM: CT OF THE RIGHT HIP WITHOUT CONTRAST  TECHNIQUE: Multidetector CT imaging of the right hip was performed according to the standard protocol. Multiplanar CT image reconstructions were also generated.  COMPARISON:  Right hip radiographs performed 07/27/2014  FINDINGS: There is a mildly displaced fracture involving the superior aspect of the right greater femoral trochanter. This appears to extend only to the base of the greater femoral trochanter, adjacent to the femoral neck. The femoral neck appears intact. There is no evidence of intertrochanteric extension. Mild overlying soft tissue injury is noted. No hip joint effusion is seen.  The right femoral head remains seated at the acetabulum. The right superior and inferior pubic rami appear intact. The right acetabulum is unremarkable in appearance. The right sacroiliac joint is unremarkable in appearance. The pubic symphysis is within normal limits.  Scattered vascular calcifications are seen. The bladder is significantly distended and grossly unremarkable. The musculature of the proximal right thigh appears intact.  IMPRESSION: Mildly displaced fracture involving the superior aspect of the right greater femoral trochanter, possibly reflecting a large avulsion fragment. This appears to extend only to the base of the greater femoral trochanter, adjacent to the femoral neck. The femoral neck appears intact. No evidence of intertrochanteric extension. Mild overlying soft tissue injury noted.   Electronically Signed   By: Garald Balding M.D.   On: 07/28/2014 02:57   Dg Hip Unilat With Pelvis 2-3 Views Right  07/27/2014   CLINICAL DATA:  Status post fall at [employer]. Right hip pain. Initial encounter.  EXAM: RIGHT HIP (WITH PELVIS) 2-3 VIEWS  COMPARISON:  None.  FINDINGS: There appears to be a mildly displaced fracture involving the  right greater femoral trochanter, without extension into the intertrochanteric region. The fragment is relatively prominent, though given its location, this likely reflects an acute avulsion injury.  The right hip joint is unremarkable in appearance. There is slight flattening of the left femoral head, degenerative in nature. Mild degenerative change is noted at the lower lumbar spine.  Diffuse vascular calcifications are seen. The sacroiliac joints are grossly unremarkable, aside from mild sclerotic change. The visualized bowel gas pattern is within normal limits.  IMPRESSION: 1. Mildly displaced fracture involving the right greater femoral trochanter, without extension into the intertrochanteric region. The fragment is relatively prominent, though given its location, this likely reflects an acute avulsion injury. 2. Diffuse vascular calcifications seen.   Electronically Signed   By:  Garald Balding M.D.   On: 07/27/2014 23:52   Dg Femur, Min 2 Views Right  07/27/2014   CLINICAL DATA:  Status post fall at [facility], with right thigh pain. Initial encounter.  EXAM: RIGHT FEMUR 2 VIEWS  COMPARISON:  None.  FINDINGS: There appears to be a mildly displaced fracture involving the right greater femoral trochanter, without evidence of intertrochanteric extension. The right femoral head remains seated at the acetabulum. The right knee joint is grossly unremarkable. No knee joint effusion is identified.  Scattered vascular calcifications are seen.  IMPRESSION: 1. Mildly displaced fracture involving the right greater femoral trochanter, without evidence of intertrochanteric extension. 2. Scattered vascular calcifications seen.   Electronically Signed   By: Garald Balding M.D.   On: 07/27/2014 23:47    Scheduled Meds: . antiseptic oral rinse  7 mL Mouth Rinse BID  . aspirin  325 mg Oral Daily  . calcium-vitamin D  1 tablet Oral Q breakfast  . heparin  5,000 Units Subcutaneous 3 times per day  . insulin detemir  45  Units Subcutaneous QHS  . loratadine  10 mg Oral Daily  . losartan  50 mg Oral q morning - 10a  . metoprolol tartrate  100 mg Oral Daily   And  . metoprolol tartrate  50 mg Oral QHS  . multivitamin with minerals  1 tablet Oral Daily  . pantoprazole  40 mg Oral Daily  . silver sulfADIAZINE  1 application Topical Daily  . simvastatin  20 mg Oral q1800  . sodium chloride  3 mL Intravenous Q12H   Continuous Infusions: . sodium chloride 125 mL/hr at 07/28/14 [id number]    Principal Problem:   Hip fracture, right Active Problems:   Essential hypertension   CAD (coronary artery disease)   Peripheral vascular disease   H/O aortic valve replacement   Greater trochanter fracture   Diabetes mellitus without complication   HLD (hyperlipidemia)   GERD (gastroesophageal reflux disease)   Hip fracture   Fall   Leukocytosis   Thrombocytopenia    Time spent: 30 min    [name], [facility] Pager [phone]. If 7PM-7AM, please contact night-coverage at [email or web address], password [facility] 07/28/2014, 10:13 AM  LOS: 0 days

## 2014-07-28 NOTE — Progress Notes (Signed)
PT Cancellation Note  Patient Details Name: [name] MRN: [id number] DOB: 09-24-1955   Cancelled Treatment:    Reason Eval/Treat Not Completed: Patient not medically ready. Pt presents after a fall. Imaging revealed a displaced fracture. Will hold PT eval until ortho has evaluated pt and outlined weight bearing status and any other precautions.    [name] 07/28/2014, 8:08 AM   [name], PT, DPT Acute Rehabilitation Services Pager: [phone]

## 2014-07-28 NOTE — Progress Notes (Signed)
Inpatient Rehabilitation  We received a screen request for for possible IP Rehab.  It is most unlikely that pt's insurance Nurse, mental health) would authorize an IP Rehab admission with his current diagnosis.  At this time, we do not recommend an IP Rehab consult, however if the acute team would like [place] to attempt, we will be glad to do so.    If pt. does not adequately progress for home, may need to consider SNF.    Please call if questions.  [place] Admissions Coordinator Cell [phone] Office [phone]

## 2014-07-28 NOTE — H&P (Signed)
[facility] History and Physical  [name] [id number] DOB: October 20, 1955 DOA: 07/27/2014  Referring physician: ED physician PCP: Everlean Alstrom, MD  Specialists:   Chief Complaint: right hip pain after fall  HPI: [name] is a 59 y.o. male with past medical history of hypertension, hyperlipidemia, GERD, coronary artery disease (s/p of CABG), aortic valve replacement with bovine valve, amputation of all toes, PVD, diabetes mellitus, who presents with right hip pain after fall.   Patient reports that he was in [place] shopping at about 12:30. He suddenly felt dizzy when he pushed a shopping cart, then fell on the ground. He injured his right hip and developed pain over the right hip. He could not put weight on the right leg due to pain. His dizziness lasted for about 3-4 minutes. He did not lose consciousness. He had episode of nausea, bu no vomiting, diarrhea or abdominal pain. He denies vision changes, ear ringing, chest pain, shortness breath, palpitation, headache, unilateral weakness, numbness or tingling sensations. Patient denies fever, chills, cough, abdominal pain, diarrhea, constipation, dysuria, urgency, frequency, hematuria.   In ED, patient was found to have WBC 15.5, temperature normal, tachycardia, electrolytes okay, thrombocytopenia with platelet 101. CT head is negative for acute abnormalities. X-ray of right hip showed mildly displaced fracture involving the right greater femoral trochanter, without extension into the intertrochanteric region. Patient is admitted to inpatient for further evaluation and treatment. Orthopedic surgeon was consulted by ED.  Review of Systems: As presented in the history of presenting illness, rest negative.  Where does patient live?  At home Can patient participate in ADLs? Yes  Allergy:  Allergies  Allergen Reactions  . Ace Inhibitors Cough    Past Medical History  Diagnosis Date  . Myocardial infarction  October 2010    Non-ST-segment-elevation myocardial infarction. Cardiac Catheterization revealed severe triple-vessel coronary artery disease. He underwent coronary artery bypass grafting on March 14, 2009. At that time, he had a LIMA graft to the LAD as well as saphenous vein graft with to the diagonal and ramus intermediate. He had sequential saphenous vein graft to his RV marginal branch as well as the   . Myocardial infarction     posterior descending branch of the right coronary  artery.  . Hyperlipidemia   . Hypertension   . PVD (peripheral vascular disease)   . Diabetes mellitus   . Heart murmur   . GERD (gastroesophageal reflux disease)     Past Surgical History  Procedure Laterality Date  . Amputation      Left great and second toes 2011  . Amputation      Left great and second toes 2011  . Aortic valve replacement      Pericardial tissue valve  . Coronary artery bypass graft  10  . Amputation  07/27/2011    Procedure: AMPUTATION FOOT;  Surgeon: Newt Minion, MD;  Location: [facility];  Service: Orthopedics;  Laterality: Left;  Left Midfoot Amputation     Social History:  reports that he quit smoking about 13 years ago. He does not have any smokeless tobacco history on file. He reports that he does not drink alcohol or use illicit drugs.  Family History:  Family History  Problem Relation Age of Onset  . Diabetes    . Coronary artery disease    . Heart attack Father     early age  . Heart attack Brother     At age 71     Prior to  Admission medications   Medication Sig Start Date End Date Taking? Authorizing Provider  aspirin 325 MG tablet Take 325 mg by mouth daily.     Yes Historical Provider, MD  calcium-vitamin D (OSCAL WITH D) 500-200 MG-UNIT per tablet Take 1 tablet by mouth daily.   Yes Historical Provider, MD  cetirizine (ZYRTEC) 10 MG tablet Take 10 mg by mouth daily.   Yes Historical Provider, MD  FARXIGA 10 MG TABS tablet Take 10 mg by mouth daily. 07/23/14   Yes Historical Provider, MD  latanoprost (XALATAN) 0.005 % ophthalmic solution Place 1 drop into both eyes at bedtime. 07/13/14  Yes Historical Provider, MD  LEVEMIR FLEXTOUCH 100 UNIT/ML Pen Inject 60 Units into the skin at bedtime. 04/26/14  Yes Historical Provider, MD  losartan (COZAAR) 50 MG tablet Take 50 mg by mouth every morning. 07/19/14  Yes Historical Provider, MD  metoprolol (LOPRESSOR) 50 MG tablet Take 50-100 mg by mouth 2 (two) times daily. Takes 100mg  in amand 50mg  in pm   Yes Historical Provider, MD  Multiple Vitamin (MULTIVITAMIN) tablet Take 1 tablet by mouth daily.     Yes Historical Provider, MD  ondansetron (ZOFRAN) 4 MG tablet Take 4 mg by mouth every 8 (eight) hours as needed for nausea or vomiting.   Yes Historical Provider, MD  oxyCODONE-acetaminophen (PERCOCET/ROXICET) 5-325 MG per tablet Take 1 tablet by mouth every 4 (four) hours as needed for severe pain.   Yes Historical Provider, MD  pantoprazole (PROTONIX) 40 MG tablet Take 40 mg by mouth daily.     Yes Historical Provider, MD  silver sulfADIAZINE (SILVADENE) 1 % cream Apply 1 application topically as needed. For wound care 07/13/14  Yes Historical Provider, MD  simvastatin (ZOCOR) 20 MG tablet Take 20 mg by mouth daily at 6 PM.    Yes Historical Provider, MD  TANZEUM 50 MG PEN Inject 50 mg into the skin every Monday. 07/23/14  Yes Historical Provider, MD    Physical Exam: Filed Vitals:   07/27/14 2300 07/28/14 0035 07/28/14 0100 07/28/14 0130  BP: 119/47 124/73 130/70 120/62  Pulse: 103 106 106 105  Temp:  98.7 F (37.1 C)    TempSrc:  Oral    Resp: 20 23 12 13   Height:      Weight:      SpO2: 95% 95% 97% 97%   General: Not in acute distress, dry mucous and membrane HEENT:       Eyes: PERRL, EOMI, no scleral icterus       ENT: No discharge from the ears and nose, no pharynx injection, no tonsillar enlargement.        Neck: No JVD, no bruit, no mass felt. Cardiac: [id number], 3/6 systolic mumur, No gallops or  rubs Pulm: Good air movement bilaterally. Clear to auscultation bilaterally. No rales, wheezing, rhonchi or rubs. Abd: Soft, nondistended, nontender, no rebound pain, no organomegaly, BS present Ext: No edema bilaterally. 2+DP/PT pulse bilaterally. All toes missing. Erythematous skin over left shin. There is a small skin tear over left shin and right lower leg. Musculoskeletal: tenderness over the right lateral hip. Intact strength of the right lower extremity with normal sensation and 2+ DP and PT pulses. Limited range of motion secondary to pain. Skin: No rashes.  Neuro: Alert and oriented X3, cranial nerves II-XII grossly intact, muscle strength 5/5 in all extremeties, sensation to light touch intact. Brachial reflex 2+ bilaterally. Knee reflex 1+ bilaterally.  Psych: Patient is not psychotic, no suicidal or hemocidal ideation.  Labs on Admission:  Basic Metabolic Panel:  Recent Labs Lab 07/27/14 2142  NA 140  K 5.1  CL 102  GLUCOSE 220*  BUN 21  CREATININE 1.10   Liver Function Tests: No results for input(s): AST, ALT, ALKPHOS, BILITOT, PROT, ALBUMIN in the last 168 hours. No results for input(s): LIPASE, AMYLASE in the last 168 hours. No results for input(s): AMMONIA in the last 168 hours. CBC:  Recent Labs Lab 07/27/14 2133 07/27/14 2142  WBC 15.5*  --   NEUTROABS 13.5*  --   HGB 15.3 16.3  HCT 45.8 48.0  MCV 85.9  --   PLT 101*  --    Cardiac Enzymes: No results for input(s): CKTOTAL, CKMB, CKMBINDEX, TROPONINI in the last 168 hours.  BNP (last 3 results) No results for input(s): BNP in the last 8760 hours.  ProBNP (last 3 results) No results for input(s): PROBNP in the last 8760 hours.  CBG: No results for input(s): GLUCAP in the last 168 hours.  Radiological Exams on Admission: Ct Head Wo Contrast  07/27/2014   CLINICAL DATA:  Acute onset of dizziness and fall. Vomiting. Initial encounter.  EXAM: CT HEAD WITHOUT CONTRAST  TECHNIQUE: Contiguous axial  images were obtained from the base of the skull through the vertex without intravenous contrast.  COMPARISON:  None.  FINDINGS: There is no evidence of acute infarction, mass lesion, or intra- or extra-axial hemorrhage on CT.  Prominence of the ventricles and sulci reflects mild cortical volume loss. Mild cerebellar atrophy is noted. Mild periventricular white matter change likely reflects small vessel ischemic microangiopathy.  The brainstem and fourth ventricle are within normal limits. The basal ganglia are unremarkable in appearance. The cerebral hemispheres demonstrate grossly normal gray-white differentiation. No mass effect or midline shift is seen.  There is no evidence of fracture; visualized osseous structures are unremarkable in appearance. The orbits are within normal limits. Mucosal thickening is noted at the maxillary sinuses bilaterally. The remaining paranasal sinuses and mastoid air cells are well-aerated. No significant soft tissue abnormalities are seen.  IMPRESSION: 1. No evidence of traumatic intracranial injury or fracture. 2. Mild cortical volume loss and scattered small vessel ischemic microangiopathy. 3. Mucosal thickening at the maxillary sinuses bilaterally.   Electronically Signed   By: Garald Balding M.D.   On: 07/27/2014 23:58   Dg Hip Unilat With Pelvis 2-3 Views Right  07/27/2014   CLINICAL DATA:  Status post fall at [employer]. Right hip pain. Initial encounter.  EXAM: RIGHT HIP (WITH PELVIS) 2-3 VIEWS  COMPARISON:  None.  FINDINGS: There appears to be a mildly displaced fracture involving the right greater femoral trochanter, without extension into the intertrochanteric region. The fragment is relatively prominent, though given its location, this likely reflects an acute avulsion injury.  The right hip joint is unremarkable in appearance. There is slight flattening of the left femoral head, degenerative in nature. Mild degenerative change is noted at the lower lumbar spine.   Diffuse vascular calcifications are seen. The sacroiliac joints are grossly unremarkable, aside from mild sclerotic change. The visualized bowel gas pattern is within normal limits.  IMPRESSION: 1. Mildly displaced fracture involving the right greater femoral trochanter, without extension into the intertrochanteric region. The fragment is relatively prominent, though given its location, this likely reflects an acute avulsion injury. 2. Diffuse vascular calcifications seen.   Electronically Signed   By: Garald Balding M.D.   On: 07/27/2014 23:52   Dg Femur, Min 2 Views Right  07/27/2014  CLINICAL DATA:  Status post fall at [facility], with right thigh pain. Initial encounter.  EXAM: RIGHT FEMUR 2 VIEWS  COMPARISON:  None.  FINDINGS: There appears to be a mildly displaced fracture involving the right greater femoral trochanter, without evidence of intertrochanteric extension. The right femoral head remains seated at the acetabulum. The right knee joint is grossly unremarkable. No knee joint effusion is identified.  Scattered vascular calcifications are seen.  IMPRESSION: 1. Mildly displaced fracture involving the right greater femoral trochanter, without evidence of intertrochanteric extension. 2. Scattered vascular calcifications seen.   Electronically Signed   By: Garald Balding M.D.   On: 07/27/2014 23:47    EKG: Independently reviewed. Some nonspecific changes  Assessment/Plan Principal Problem:   Hip fracture, right Active Problems:   Essential hypertension   CAD (coronary artery disease)   Peripheral vascular disease   H/O aortic valve replacement   Greater trochanter fracture   Diabetes mellitus without complication   HLD (hyperlipidemia)   GERD (gastroesophageal reflux disease)   Hip fracture   Fall   Leukocytosis   Thrombocytopenia  Right hip fracture: As evidenced by x-ray. Patient has moderate pain now. No neurovascular compromise. Orthopedic surgeon was consulted by Ed.  - will  admit to tele bed given his history of dizziness before fall - Pain control: morphine prn and percocet - follow up ortho recs - NPO after MN - IVF: 1L normal saline bolus, followed by 125 mL per hour - type and cross - INR/PTT -Per Dr. Rolena Infante, pt needs CT of the right hip. If CT shows intertrochanteric fracture, Orthopedics should to be notified.  Fall: Patient had episode of dizziness before having fall. No other symptoms, such as chest pain, palpitation or any signs of stroke. Given his significant history of cardiac issues, including CAD with s/p of CABG and aortic valve replacement, will evaluated his heart little further. - trop x 3 - ekg -2d echo  CAD: s/p of CABG. No chest pain -see above -Continue aspirin, metoprolol, Zocor  Leukocytosis: It is most likely due to stress-induced demargination, but patient has some skin tears with erythematous skin change over left shin. He also has thrombocytopenia, infection can not be completely ruled out. -Will blood culture x 2 -follow up CBC -continue home topical silver sulfadiazine cream   Diabetes mellitus: A1c was 6.1 on 03/27/09. She is on Levemir, Farxiga and Tanzeum at home -Decrease levemir dose from 60 to 45 units daily -SSI -Check A1c  Hypertension: -Continue Cozaar and metoprolol  GERD: -Protonix  Hyperlipidemia: LDL was a 45 on 03/01/09 -Continue Zocor -Check FLP   DVT ppx: SQ Heparin         Code Status: Full code Family Communication: None at bed side.     Disposition Plan: Admit to inpatient   Date of Service 07/28/2014    Ivor Costa [facility] Pager [phone]  If 7PM-7AM, please contact night-coverage [email or web address] Password [email or web address] 07/28/2014, 1:51 AM

## 2014-07-28 NOTE — Progress Notes (Signed)
Inpatient Diabetes Program Recommendations  AACE/ADA: New Consensus Statement on Inpatient Glycemic Control (2013)  Target Ranges:  Prepandial:   less than 140 mg/dL      Peak postprandial:   less than 180 mg/dL (1-2 hours)      Critically ill patients:  140 - 180 mg/dL   Reason for Assessment:  Results for MOODY, ISAAC (MRN [id number]) as of 07/28/2014 12:58  Ref. Range 07/27/2011 21:58 07/28/2011 07:08 07/28/2011 11:31 07/28/2014 04:13 07/28/2014 08:05  Glucose-Capillary Latest Range: 70-99 mg/dL 162 (H) 111 (H) 151 (H) 185 (H) 142 (H)   Diabetes history: Type 2 diabetes Outpatient Diabetes medications: Farxiga 10 mg daily, Levemir 60 units q HS, Tanzeum 50 mg daily Current orders for Inpatient glycemic control:  Levemir 45 units daily  May consider adding Novolog moderate tid with meals.    Thanks, Adah Perl, RN, BC-ADM Inpatient Diabetes Coordinator Pager [phone] (8a-5p)

## 2014-07-28 NOTE — Progress Notes (Signed)
Received report from ED RN. Patient is being admitted with right hip fracture. He will go to CT before coming to the floor.

## 2014-07-28 NOTE — Progress Notes (Signed)
UR Completed.  [phone]  

## 2014-07-28 NOTE — Evaluation (Signed)
Physical Therapy Evaluation Patient Details Name: [name] MRN: [id number] DOB: 09-29-1955 Today's Date: 07/28/2014   History of Present Illness  Pt is a 59 y.o. male with PMH of HTN, hyperlipidemia, GERD, CAD (s/p of CABG), aortic valve replacement with bovine valve, amputation of all toes, PVD, and DM, who presents with right hip pain after fall. Patient reports that he was in [place] shopping at about 12:30. He suddenly felt flushed, faint, and dizzy and then fell on the ground. He injured his right hip and developed pain over the right hip. He could not put weight on the right leg due to pain. His dizziness lasted for about 3-4 minutes. X-ray of right hip showed mildly displaced fracture involving the right greater femoral trochanter, without extension into the intertrochanteric region. Orthopedic surgeons recommended against surgery at this time.  Clinical Impression  Pt admitted with above diagnosis. Pt currently with functional limitations due to the deficits listed below (see PT Problem List). At the time of PT eval pt was able to perform transfers with min-mod assist. Was not able to initiate gait training this session due to pain and fatigue. Pt will benefit from skilled PT to increase their independence and safety with mobility to allow discharge to the venue listed below.  Recommending CIR consult as pt was independent PTA and is looking to get back to living alone with intermittent family support as soon as possible.   Orthostatics taken during session by NT and entered in flowsheets section of chart.      Follow Up Recommendations CIR;Supervision/Assistance - 24 hour    Equipment Recommendations  None recommended by PT    Recommendations for Other Services Rehab consult     Precautions / Restrictions Precautions Precautions: Fall Restrictions: NO ABDUCTION Weight Bearing Restrictions: WBAT R      Mobility  Bed Mobility Overal bed mobility: Needs Assistance Bed  Mobility: Supine to Sit     Supine to sit: Min assist     General bed mobility comments: Assist for movement and support of the RLE during transition to EOB. Pt was able to scoot and use bed rails for support. Bed pad utilized for completing full scoot to EOB.   Transfers Overall transfer level: Needs assistance Equipment used: Rolling walker (2 wheeled) Transfers: Sit to/from Stand Sit to Stand: Mod assist         General transfer comment: Assist to power-up to full standing and maintain balance. Pt was able to hold standing for orthostatic BP reading. Pt was able to take pivotal steps around to the recliner chair.   Ambulation/Gait             General Gait Details: Further gait training deferred due to pain and fatigue.   Stairs            Wheelchair Mobility    Modified Rankin (Stroke Patients Only)       Balance Overall balance assessment: Needs assistance Sitting-balance support: Feet supported;No upper extremity supported Sitting balance-Leahy Scale: Good     Standing balance support: Bilateral upper extremity supported;During functional activity Standing balance-Leahy Scale: Poor                               Pertinent Vitals/Pain Pain Assessment: 0-10 Pain Score: 2  Pain Location: At rest in R hip. Pain increases "substantially" during movement. Pain Descriptors / Indicators: Grimacing;Guarding;Sore Pain Intervention(s): Limited activity within patient's tolerance;Monitored during session;Repositioned;RN gave pain  meds during session    [place] expects to be discharged to:: Private residence Living Arrangements: Alone Available Help at Discharge: Family;Available PRN/intermittently Type of Home: House Home Access: Ramped entrance     Home Layout: One level Home Equipment: Walker - 2 wheels;Cane - single point;Crutches;Shower seat;Bedside commode Additional Comments: Brother may have a tub bench that pt can  use. Pt feels that he may be able to work out 24 hour assistance at d/c when he gets home.    Prior Function Level of Independence: Independent with assistive device(s)         Comments: Pt states he uses a cane for ambulation and did not require assist for bathing/dressing PTA.      Hand Dominance   Dominant Hand: Right    Extremity/Trunk Assessment   Upper Extremity Assessment: Defer to OT evaluation           Lower Extremity Assessment: RLE deficits/detail RLE Deficits / Details: Decreased strength, AROM and acute pain consistent with femur fracture.     Cervical / Trunk Assessment: Normal  Communication   Communication: No difficulties  Cognition Arousal/Alertness: Awake/alert Behavior During Therapy: WFL for tasks assessed/performed Overall Cognitive Status: Within Functional Limits for tasks assessed                      General Comments      Exercises        Assessment/Plan    PT Assessment Patient needs continued PT services  PT Diagnosis Difficulty walking;Generalized weakness;Acute pain   PT Problem List Decreased strength;Decreased range of motion;Decreased activity tolerance;Decreased balance;Decreased mobility;Decreased knowledge of use of DME;Decreased safety awareness;Decreased knowledge of precautions;Pain  PT Treatment Interventions DME instruction;Gait training;Stair training;Functional mobility training;Therapeutic activities;Therapeutic exercise;Neuromuscular re-education;Patient/family education   PT Goals (Current goals can be found in the Care Plan section) Acute Rehab PT Goals Patient Stated Goal: Return to PLOF PT Goal Formulation: With patient Time For Goal Achievement: 08/04/14 Potential to Achieve Goals: Good    Frequency Min 5X/week   Barriers to discharge        Co-evaluation               End of Session Equipment Utilized During Treatment: Gait belt Activity Tolerance: Patient limited by pain;Patient  limited by fatigue Patient left: in chair;with call bell/phone within reach;with family/visitor present Nurse Communication: Mobility status         Time: 1019-1100 PT Time Calculation (min) (ACUTE ONLY): 41 min   Charges:   PT Evaluation $Initial PT Evaluation Tier I: 1 Procedure PT Treatments $Therapeutic Activity: 23-37 mins   PT G Codes:        [name] 08/25/14, 12:32 PM   [name], PT, DPT Acute Rehabilitation Services Pager: [phone]

## 2014-07-28 NOTE — Consult Note (Signed)
Everlean Alstrom, MD Chief Complaint: Right hip pain s/p fall History: 59 year old male presents with right hip pain after fall. He became dizzy and fell to his right side [employer]. No loss of consciousness and did not hit his head. No coinciding chest pain or shortness of breath. No symptoms since that time but has had persistent right hip pain. Unable to walk. He was evaluated at an outside hospital where he supposedly had negative plain films and was discharged home. Due to continued inability to walk, presents today.   Past Medical History  Diagnosis Date  . Myocardial infarction October 2010    Non-ST-segment-elevation myocardial infarction. Cardiac Catheterization revealed severe triple-vessel coronary artery disease. He underwent coronary artery bypass grafting on March 14, 2009. At that time, he had a LIMA graft to the LAD as well as saphenous vein graft with to the diagonal and ramus intermediate. He had sequential saphenous vein graft to his RV marginal branch as well as the   . Myocardial infarction     posterior descending branch of the right coronary  artery.  . Hyperlipidemia   . Hypertension   . PVD (peripheral vascular disease)   . Diabetes mellitus   . Heart murmur   . GERD (gastroesophageal reflux disease)     Allergies  Allergen Reactions  . Ace Inhibitors Cough    No current facility-administered medications on file prior to encounter.   Current Outpatient Prescriptions on File Prior to Encounter  Medication Sig Dispense Refill  . aspirin 325 MG tablet Take 325 mg by mouth daily.      . calcium-vitamin D (OSCAL WITH D) 500-200 MG-UNIT per tablet Take 1 tablet by mouth daily.    . cetirizine (ZYRTEC) 10 MG tablet Take 10 mg by mouth daily.    . metoprolol (LOPRESSOR) 50 MG tablet Take 50-100 mg by mouth 2 (two) times daily. Takes 100mg  in amand 50mg  in pm    . Multiple Vitamin (MULTIVITAMIN) tablet Take 1 tablet by mouth daily.      . pantoprazole (PROTONIX)  40 MG tablet Take 40 mg by mouth daily.      . simvastatin (ZOCOR) 20 MG tablet Take 20 mg by mouth daily at 6 PM.     . [DISCONTINUED] furosemide (LASIX) 40 MG tablet Take 40 mg by mouth 2 (two) times daily.        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 Vomiting. Initial encounter.  EXAM: CT HEAD WITHOUT CONTRAST  TECHNIQUE: Contiguous axial images were obtained from the base of the skull through the vertex without intravenous contrast.  COMPARISON:  None.  FINDINGS: There is no evidence of acute infarction, mass lesion, or intra- or extra-axial hemorrhage on CT.  Prominence of the ventricles and sulci reflects mild cortical volume loss. Mild cerebellar atrophy is noted. Mild periventricular white matter change likely reflects small vessel ischemic microangiopathy.  The brainstem and fourth ventricle are within normal limits. The basal ganglia are unremarkable in appearance. The cerebral hemispheres demonstrate grossly normal gray-white differentiation. No mass effect or midline shift is seen.  There is no evidence of fracture; visualized osseous structures are unremarkable in appearance. The orbits are within normal limits. Mucosal thickening is noted at the maxillary sinuses bilaterally. The remaining paranasal sinuses and mastoid air cells are well-aerated. No significant soft tissue abnormalities are seen.  IMPRESSION: 1. No evidence of traumatic intracranial injury or fracture. 2. Mild cortical volume loss and scattered small vessel ischemic microangiopathy. 3. Mucosal thickening  at the maxillary sinuses bilaterally.   Electronically Signed   By: Garald Balding M.D.   On: 07/27/2014 23:58   Ct Hip Right Wo Contrast  07/28/2014   CLINICAL DATA:  Evaluate known right greater femoral trochanter fracture. Status post fall, with right hip pain. Initial encounter.  EXAM: CT OF THE RIGHT HIP WITHOUT CONTRAST  TECHNIQUE: Multidetector CT imaging of the right hip was performed according to the standard protocol. Multiplanar CT image reconstructions were also generated.  COMPARISON:  Right hip radiographs performed 07/27/2014  FINDINGS: There is a mildly displaced fracture involving the superior aspect of the right greater femoral trochanter. This appears to extend only to the base of the greater femoral trochanter, adjacent to the femoral neck. The femoral neck appears intact. There is no evidence of intertrochanteric extension. Mild overlying soft tissue injury is noted. No hip joint effusion is seen.  The right femoral head remains seated at the acetabulum. The right superior and inferior pubic rami appear intact. The right acetabulum is unremarkable in appearance. The right sacroiliac joint is unremarkable in appearance. The pubic symphysis is within normal limits.  Scattered vascular calcifications are seen. The bladder is significantly distended and grossly unremarkable. The musculature of the proximal right thigh appears intact.  IMPRESSION: Mildly displaced fracture involving the superior aspect of the right greater femoral trochanter, possibly reflecting a large avulsion fragment. This appears to extend only to the base of the greater femoral trochanter, adjacent to the femoral neck. The femoral neck appears intact. No evidence of intertrochanteric extension. Mild overlying soft tissue injury noted.   Electronically Signed   By: Garald Balding M.D.   On: 07/28/2014 02:57   Dg Hip Unilat With Pelvis 2-3 Views Right  07/27/2014   CLINICAL DATA:  Status post fall at [employer]. Right hip  pain. Initial encounter.  EXAM: RIGHT HIP (WITH PELVIS) 2-3 VIEWS  COMPARISON:  None.  FINDINGS: There appears to be a mildly displaced fracture involving the right greater femoral trochanter, without extension into the intertrochanteric region. The fragment is relatively prominent, though given its location, this likely reflects an acute avulsion injury.  The right hip joint is unremarkable in appearance. There is slight flattening of the left femoral head, degenerative in nature. Mild degenerative change is noted at the lower lumbar spine.  Diffuse vascular calcifications are seen. The sacroiliac joints are grossly unremarkable, aside from mild sclerotic change. The visualized bowel gas pattern is within normal limits.  IMPRESSION: 1. Mildly displaced fracture involving the right greater femoral trochanter, without extension into the intertrochanteric region. The fragment is relatively prominent, though given its location, this likely reflects an acute avulsion injury. 2. Diffuse vascular calcifications seen.   Electronically Signed   By: Garald Balding M.D.   On: 07/27/2014 23:52   Dg Femur, Min 2 Views Right  07/27/2014   CLINICAL DATA:  Status post fall at [facility], with right thigh pain. Initial encounter.  EXAM: RIGHT FEMUR 2 VIEWS  COMPARISON:  None.  FINDINGS: There appears to be a mildly displaced fracture involving the right greater femoral trochanter,  without evidence of intertrochanteric extension. The right femoral head remains seated at the acetabulum. The right knee joint is grossly unremarkable. No knee joint effusion is identified.  Scattered vascular calcifications are seen.  IMPRESSION: 1. Mildly displaced fracture involving the right greater femoral trochanter, without evidence of intertrochanteric extension. 2. Scattered vascular calcifications seen.   Electronically Signed   By: Garald Balding M.D.   On: 07/27/2014 23:47    A/P:  Patient s/p fall with displaced right greater troch.  Fx. Stable injury Discussed case and images with my partner Dr Lyla Glassing - agree with non-operative treatment WBAT with walker no active abduction F/u in 2 weeks for repeat xrays  Will monitor exam during hospitalization

## 2014-07-29 DIAGNOSIS — S72111D Displaced fracture of greater trochanter of right femur, subsequent encounter for closed fracture with routine healing: Secondary | ICD-10-CM

## 2014-07-29 DIAGNOSIS — D696 Thrombocytopenia, unspecified: Secondary | ICD-10-CM

## 2014-07-29 LAB — GLUCOSE, CAPILLARY: Glucose-Capillary: 106 mg/dL — ABNORMAL HIGH (ref 70–99)

## 2014-07-29 LAB — HEMOGLOBIN A1C
HEMOGLOBIN A1C: 9.9 % — AB (ref 4.8–5.6)
MEAN PLASMA GLUCOSE: 237 mg/dL

## 2014-07-29 NOTE — Progress Notes (Signed)
Occupational Therapy Treatment Patient Details Name: [name] MRN: [id number] DOB: [birth year] Today's Date: 07/29/2014    History of present illness Pt is a 59 y.o. male with PMH of HTN, hyperlipidemia, GERD, CAD (s/p of CABG), aortic valve replacement with bovine valve, amputation of all toes, PVD, and DM, who presents with right hip pain after fall. Patient reports that he was in [name] shopping at about 12:30. He suddenly felt flushed, faint, and dizzy and then fell on the ground. He injured his right hip and developed pain over the right hip. He could not put weight on the right leg due to pain. His dizziness lasted for about 3-4 minutes. X-ray of right hip showed mildly displaced fracture involving the right greater femoral trochanter, without extension into the intertrochanteric region. Orthopedic surgeons recommended against surgery at this time.   OT comments  Pt limited some by pain this visit with attempting to use AE. Educated on AE options for LB self care and coverage. Practiced with reacher and sock aid for LB dressing. See note that pt not able to go to CIR. Recommend SNF at d/c to progress ADL independence. Will follow.   Follow Up Recommendations  Supervision/Assistance - 24 hour;SNF    Equipment Recommendations  Tub/shower bench    Recommendations for Other Services      Precautions / Restrictions Precautions Precautions: Fall Precaution Comments: no active hip abduction Restrictions Weight Bearing Restrictions: Yes RLE Weight Bearing: Weight bearing as tolerated       Mobility Bed Mobility            Transfers                Balance                                   ADL                                         General ADL Comments: Educated pt on AE options for LB self care and coverage. Pt practiced with reacher to doff R sock with mod assist. Pt had difficulty with placing reacher into sock and  manipulating reacher to doff sock and the pain was also limiting him with trying to apply pressure to reacher. Donned sock with sock aid  with mod assist although had to assist with pulling on the one cord as the other cord had fallen off. Pt verbalized understanding of the concept of how to use sock aid. Also educated on how to use reacher to don pants, etc. Discussed LHS as well as shoe horn also. Pt declined wanting to get back into bed at this time. Encouraged to call for assist when ready.       Vision                     Perception     Praxis      Cognition   Behavior During Therapy: [facility] for tasks assessed/performed Overall Cognitive Status: Within Functional Limits for tasks assessed                       Extremity/Trunk Assessment               Exercises    Shoulder Instructions  General Comments      Pertinent Vitals/ Pain       Pain Assessment: 0-10 Pain Score: 5  Pain Location: R hip Pain Descriptors / Indicators: Grimacing;Discomfort Pain Intervention(s): Repositioned;Monitored during session  Home Living                                          Prior Functioning/Environment              Frequency Min 2X/week     Progress Toward Goals  OT Goals(current goals can now be found in the care plan section)  Progress towards OT goals: Progressing toward goals     Plan Discharge plan needs to be updated    Co-evaluation                 End of Session Equipment Utilized During Treatment: Other (comment) (AE)   Activity Tolerance Patient limited by pain   Patient Left in chair;with call bell/phone within reach   Nurse Communication          Time: [id number] OT Time Calculation (min): 17 min  Charges: OT General Charges $OT Visit: 1 Procedure OT Treatments $Self Care/Home Management : 8-22 mins  Jules Schick  [id number] 07/29/2014, 12:25 PM

## 2014-07-29 NOTE — Progress Notes (Signed)
[facility] PROGRESS NOTE  [name] [id number] DOB: 1955/07/23 DOA: 07/27/2014 PCP: Everlean Alstrom, MD  Brief Summary  [name] is a 59 y.o. male with past medical history of hypertension, hyperlipidemia, GERD, coronary artery disease (s/p of CABG), aortic valve replacement with bovine valve, amputation of all toes, PVD, diabetes mellitus, who presents with right hip pain after fall.   Patient reports that he was in [place] shopping at about 12:30. He suddenly felt flushed, faint, and dizzy and then fell on the ground. He injured his right hip and developed pain over the right hip. He could not put weight on the right leg due to pain. His dizziness lasted for about 3-4 minutes. He did not lose consciousness. He had episode of nausea, bu no vomiting, diarrhea or abdominal pain. He denied vision changes, ear ringing, chest pain, shortness breath, palpitation, headache, unilateral weakness, numbness or tingling sensations. Patient denied fever, chills, cough, abdominal pain, diarrhea, constipation, dysuria, urgency, frequency, hematuria.   In ED, patient was found to have WBC 15.5, temperature normal, tachycardia, electrolytes okay, thrombocytopenia with platelet 101. CT head is negative for acute abnormalities. X-ray of right hip showed mildly displaced fracture involving the right greater femoral trochanter, without extension into the intertrochanteric region. Patient was admitted to inpatient for further evaluation and treatment. Orthopedic surgeons recommended against surgery at this time.  He is currently undergoing evaluation by PT/OT and further testing for cause of his presyncope.  Assessment/Plan  Right hip fracture, avulsion fracture of the greater trochanter:  - Pain control: percocet prn - Orthopedics evaluated and recommended WBAT and follow up with Orthopedics in 2 weeks for repeat XR -  PT/OT recommending SNF -  Insurance will not cover inpatient  rehab -  SW consult placed for SNF placement  Fall: Patient had episode of dizziness before having fall. No other symptoms, such as chest pain, palpitation or any signs of stroke. Given his significant history of cardiac issues, including CAD with s/p of CABG and aortic valve replacement, -  trop x 3:  neg -  Ekg:  Sinus tach with Q-waves in anterior leads -  Tele:  NSR with ventricular bigeminy -  Echo:  Pending -  Orthostatics deferred initially and post hydration were negative -  CXR:  No infiltrate -  UA neg for signs of infection  CAD: s/p of CABG. No chest pain -see above -Continue aspirin, metoprolol, Zocor  Leukocytosis: It is most likely due to stress-induced demargination, but patient has some skin tears with erythematous skin change over left shin. He also has thrombocytopenia, infection can not be completely ruled out. - blood culture x 2 NGTD -continue home topical silver sulfadiazine cream  Diabetes mellitus type 2, uncontrolled. - A1c 9.9  -  He is on Levemir, Farxiga and Tanzeum at home -  CBG stable -  continue levemir 45 units daily -  Continue SSI  Hypertension: -Continue Cozaar and metoprolol  GERD: -Protonix  Hyperlipidemia: LDL was a 45 on 03/01/09 -Continue Zocor -Check FLP  Lab Results  Component Value Date   CHOL 104 07/28/2014   HDL 34* 07/28/2014   LDLCALC 56 07/28/2014   TRIG 69 07/28/2014   CHOLHDL 3.1 07/28/2014   Nocturnal hypoxemia -  Needs outpatient sleep study  Mild to moderate thrombocytopenia -  Platelets approximately stable, no anemia to suggest hemolysis -  No obvious bleeding and not a common side effect of any of his medications -  May be reactive to recent  fall and fracture -  If persistent, recommend 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  To SNF tomorrow  Consultants:  Orthopedics, Dr. Rolena Infante  Procedures:  3 pelvis  CT head  CT hip without contrast, right  Antibiotics:  None   HPI/Subjective:  Feeling better today.  Having pain in the right hip but was able to ambulate to door with PT.  Continues to deny fevers, chills, cough, dysuria, vomiting, diarrhea  Objective: Filed Vitals:   07/28/14 2100 07/28/14 2114 07/29/14 0500 07/29/14 0759  BP: 122/67 126/71 122/63 133/64  Pulse: 96 96 89 84  Temp: 98.3 F (36.8 C)  98.4 F (36.9 C) 98.3 F (36.8 C)  TempSrc: Oral  Oral Oral  Resp: 18  16 18   Height:      Weight: 95.7 kg (210 lb 15.7 oz)     SpO2: 92%  100% 94%    Intake/Output Summary (Last 24 hours) at 07/29/14 1344 Last data filed at 07/29/14 1000  Gross per 24 hour  Intake   1100 ml  Output    900 ml  Net    200 ml   Filed Weights   07/27/14 2052 07/28/14 0305 07/28/14 2100  Weight: 97.523 kg (215 lb) 95.3 kg (210 lb 1.6 oz) 95.7 kg (210 lb 15.7 oz)    Exam:   General:  Adult male, No acute distress  HEENT:  NCAT, MMM  Cardiovascular:  RRR, nl S1, S2, 3/6 systolic murmur, no rubs or gallops, 2+ radial pulses, warm extremities  Respiratory:  CTAB, no increased WOB  Abdomen:   NABS, soft, NT/ND  MSK:   Normal tone and bulk, 1+ bilateral pitting LEE  Neuro:   Strength 5/5 bilateral upper extremities and left lower extremity.  Knee and hip RLE limited by pain.  Ankle 5/5.    Data Reviewed: Basic Metabolic Panel:  Recent Labs Lab 07/27/14 2142 07/28/14 0052 07/28/14 0522  NA 140 139 140  K 5.1 4.8 5.0  CL 102 107 109  CO2  --  27 27  GLUCOSE 220* 213* 177*  BUN 21 20 21   CREATININE 1.10 1.27 1.24  CALCIUM  --  9.2 8.5   Liver Function Tests: No results for input(s): AST, ALT, ALKPHOS, BILITOT, PROT, ALBUMIN in the last 168 hours. No results for input(s): LIPASE, AMYLASE in the last 168 hours. No results for input(s): AMMONIA in the last 168  hours. CBC:  Recent Labs Lab 07/27/14 2133 07/27/14 2142 07/28/14 0522  WBC 15.5*  --  12.4*  NEUTROABS 13.5*  --   --   HGB 15.3 16.3 13.4  HCT 45.8 48.0 40.7  MCV 85.9  --  85.9  PLT 101*  --  94*   Cardiac Enzymes:  Recent Labs Lab 07/28/14 0522 07/28/14 0925 07/28/14 1435  TROPONINI 0.03 <0.03 0.03   BNP (last 3 results) No results for input(s): BNP in the last 8760 hours.  ProBNP (last 3 results) No results for input(s): PROBNP in the last 8760 hours.  CBG:  Recent Labs Lab 07/28/14 0413 07/28/14 0805 07/28/14 1630 07/29/14 0758  GLUCAP 185* 142* 139* 106*    Recent Results (from the past 240 hour(s))  MRSA PCR Screening     Status: None   Collection Time: 07/28/14  4:33 AM  Result Value  Ref Range Status   MRSA by PCR NEGATIVE NEGATIVE Final    Comment:        The GeneXpert MRSA Assay (FDA approved for NASAL specimens only), is one component of a comprehensive MRSA colonization surveillance program. It is not intended to diagnose MRSA infection nor to guide or monitor treatment for MRSA infections.   Culture, blood (routine x 2)     Status: None (Preliminary result)   Collection Time: 07/28/14  5:22 AM  Result Value Ref Range Status   Specimen Description BLOOD LEFT ARM  Final   Special Requests BOTTLES DRAWN AEROBIC AND ANAEROBIC 8CC  Final   Culture   Final           BLOOD CULTURE RECEIVED NO GROWTH TO DATE CULTURE WILL BE HELD FOR 5 DAYS BEFORE ISSUING A FINAL NEGATIVE REPORT Performed at [facility]    Report Status PENDING  Incomplete  Culture, blood (routine x 2)     Status: None (Preliminary result)   Collection Time: 07/28/14  5:29 AM  Result Value Ref Range Status   Specimen Description BLOOD LEFT WRIST  Final   Special Requests BOTTLES DRAWN AEROBIC AND ANAEROBIC 5CC  Final   Culture   Final           BLOOD CULTURE RECEIVED NO GROWTH TO DATE CULTURE WILL BE HELD FOR 5 DAYS BEFORE ISSUING A FINAL NEGATIVE  REPORT Performed at [facility]    Report Status PENDING  Incomplete     Studies: Ct Head Wo Contrast  07/27/2014   CLINICAL DATA:  Acute onset of dizziness and fall. Vomiting. Initial encounter.  EXAM: CT HEAD WITHOUT CONTRAST  TECHNIQUE: Contiguous axial images were obtained from the base of the skull through the vertex without intravenous contrast.  COMPARISON:  None.  FINDINGS: There is no evidence of acute infarction, mass lesion, or intra- or extra-axial hemorrhage on CT.  Prominence of the ventricles and sulci reflects mild cortical volume loss. Mild cerebellar atrophy is noted. Mild periventricular white matter change likely reflects small vessel ischemic microangiopathy.  The brainstem and fourth ventricle are within normal limits. The basal ganglia are unremarkable in appearance. The cerebral hemispheres demonstrate grossly normal gray-white differentiation. No mass effect or midline shift is seen.  There is no evidence of fracture; visualized osseous structures are unremarkable in appearance. The orbits are within normal limits. Mucosal thickening is noted at the maxillary sinuses bilaterally. The remaining paranasal sinuses and mastoid air cells are well-aerated. No significant soft tissue abnormalities are seen.  IMPRESSION: 1. No evidence of traumatic intracranial injury or fracture. 2. Mild cortical volume loss and scattered small vessel ischemic microangiopathy. 3. Mucosal thickening at the maxillary sinuses bilaterally.   Electronically Signed   By: Garald Balding M.D.   On: 07/27/2014 23:58   Ct Hip Right Wo Contrast  07/28/2014   CLINICAL DATA:  Evaluate known right greater femoral trochanter fracture. Status post fall, with right hip pain. Initial encounter.  EXAM: CT OF THE RIGHT HIP WITHOUT CONTRAST  TECHNIQUE: Multidetector CT imaging of the right hip was performed according to the standard protocol. Multiplanar CT image reconstructions were also generated.  COMPARISON:   Right hip radiographs performed 07/27/2014  FINDINGS: There is a mildly displaced fracture involving the superior aspect of the right greater femoral trochanter. This appears to extend only to the base of the greater femoral trochanter, adjacent to the femoral neck. The femoral neck appears intact. There is no evidence of intertrochanteric  extension. Mild overlying soft tissue injury is noted. No hip joint effusion is seen.  The right femoral head remains seated at the acetabulum. The right superior and inferior pubic rami appear intact. The right acetabulum is unremarkable in appearance. The right sacroiliac joint is unremarkable in appearance. The pubic symphysis is within normal limits.  Scattered vascular calcifications are seen. The bladder is significantly distended and grossly unremarkable. The musculature of the proximal right thigh appears intact.  IMPRESSION: Mildly displaced fracture involving the superior aspect of the right greater femoral trochanter, possibly reflecting a large avulsion fragment. This appears to extend only to the base of the greater femoral trochanter, adjacent to the femoral neck. The femoral neck appears intact. No evidence of intertrochanteric extension. Mild overlying soft tissue injury noted.   Electronically Signed   By: Garald Balding M.D.   On: 07/28/2014 02:57   Dg Chest Port 1 View  07/28/2014   CLINICAL DATA:  Dizziness  EXAM: PORTABLE CHEST - 1 VIEW  COMPARISON:  07/25/2011  FINDINGS: Low lung volumes with left basilar atelectasis. Heart size is borderline, accentuated by the low volumes and portable AP nature of the study. No confluent opacity on the right. No effusions. No acute bony abnormality.  IMPRESSION: Low lung volumes with left base atelectasis.   Electronically Signed   By: Rolm Baptise M.D.   On: 07/28/2014 12:23   Dg Hip Unilat With Pelvis 2-3 Views Right  07/27/2014   CLINICAL DATA:  Status post fall at [employer]. Right hip pain. Initial encounter.   EXAM: RIGHT HIP (WITH PELVIS) 2-3 VIEWS  COMPARISON:  None.  FINDINGS: There appears to be a mildly displaced fracture involving the right greater femoral trochanter, without extension into the intertrochanteric region. The fragment is relatively prominent, though given its location, this likely reflects an acute avulsion injury.  The right hip joint is unremarkable in appearance. There is slight flattening of the left femoral head, degenerative in nature. Mild degenerative change is noted at the lower lumbar spine.  Diffuse vascular calcifications are seen. The sacroiliac joints are grossly unremarkable, aside from mild sclerotic change. The visualized bowel gas pattern is within normal limits.  IMPRESSION: 1. Mildly displaced fracture involving the right greater femoral trochanter, without extension into the intertrochanteric region. The fragment is relatively prominent, though given its location, this likely reflects an acute avulsion injury. 2. Diffuse vascular calcifications seen.   Electronically Signed   By: Garald Balding M.D.   On: 07/27/2014 23:52   Dg Femur, Min 2 Views Right  07/27/2014   CLINICAL DATA:  Status post fall at [facility], with right thigh pain. Initial encounter.  EXAM: RIGHT FEMUR 2 VIEWS  COMPARISON:  None.  FINDINGS: There appears to be a mildly displaced fracture involving the right greater femoral trochanter, without evidence of intertrochanteric extension. The right femoral head remains seated at the acetabulum. The right knee joint is grossly unremarkable. No knee joint effusion is identified.  Scattered vascular calcifications are seen.  IMPRESSION: 1. Mildly displaced fracture involving the right greater femoral trochanter, without evidence of intertrochanteric extension. 2. Scattered vascular calcifications seen.   Electronically Signed   By: Garald Balding M.D.   On: 07/27/2014 23:47    Scheduled Meds: . antiseptic oral rinse  7 mL Mouth Rinse BID  . aspirin  325 mg Oral  Daily  . calcium-vitamin D  1 tablet Oral Q breakfast  . heparin  5,000 Units Subcutaneous 3 times per day  . insulin detemir  45 Units Subcutaneous QHS  . loratadine  10 mg Oral Daily  . losartan  50 mg Oral q morning - 10a  . metoprolol tartrate  100 mg Oral Daily   And  . metoprolol tartrate  50 mg Oral QHS  . multivitamin with minerals  1 tablet Oral Daily  . pantoprazole  40 mg Oral Daily  . silver sulfADIAZINE  1 application Topical Daily  . simvastatin  20 mg Oral q1800  . sodium chloride  3 mL Intravenous Q12H   Continuous Infusions: . sodium chloride 125 mL/hr at 07/29/14 [id number]    Principal Problem:   Hip fracture, right Active Problems:   Essential hypertension   CAD (coronary artery disease)   Peripheral vascular disease   H/O aortic valve replacement   Greater trochanter fracture   Diabetes mellitus without complication   HLD (hyperlipidemia)   GERD (gastroesophageal reflux disease)   Hip fracture   Fall   Leukocytosis   Thrombocytopenia    Time spent: 30 min    Tosca Pletz, [facility] Pager [phone]. If 7PM-7AM, please contact night-coverage at [email or web address], password [facility] 07/29/2014, 1:44 PM  LOS: 1 day

## 2014-07-29 NOTE — Progress Notes (Signed)
Physical Therapy Treatment Patient Details Name: [name] MRN: [id number] DOB: December 20, 1955 Today's Date: 07/29/2014    History of Present Illness Pt is a 59 y.o. male with PMH of HTN, hyperlipidemia, GERD, CAD (s/p of CABG), aortic valve replacement with bovine valve, amputation of all toes, PVD, and DM, who presents with right hip pain after fall. Patient reports that he was in [place] shopping at about 12:30. He suddenly felt flushed, faint, and dizzy and then fell on the ground. He injured his right hip and developed pain over the right hip. He could not put weight on the right leg due to pain. His dizziness lasted for about 3-4 minutes. X-ray of right hip showed mildly displaced fracture involving the right greater femoral trochanter, without extension into the intertrochanteric region. Orthopedic surgeons recommended against surgery at this time.    PT Comments    Pt.'s insurance will not cover an IP Rehab stay (in all likelihood and due to current diagnosis).  I have therefore changed his DC recommendation to SNF as he lives alone and is not safe for home alone at this point.  He needs ongoing therapies to reach maximum independence for return home.  Pt is agreeable for SNF for rehab.  I have notified Crawford Givens, SW.      Follow Up Recommendations  SNF;Supervision/Assistance - 24 hour     Equipment Recommendations       Recommendations for Other Services       Precautions / Restrictions Precautions Precautions: Fall Precaution Comments: no active hip abduction Restrictions Weight Bearing Restrictions: Yes RLE Weight Bearing: Weight bearing as tolerated    Mobility  Bed Mobility Overal bed mobility: Needs Assistance Bed Mobility: Supine to Sit     Supine to sit: Min assist     General bed mobility comments: assist to move R LE and prevent active hip aabduction; use of bed pad to assist in shifting hips toward EOB  Transfers Overall transfer level:  Needs assistance Equipment used: Rolling walker (2 wheeled) Transfers: Sit to/from Stand Sit to Stand: Min assist         General transfer comment: Pt. needing min assist to rise to stand from bed and from bedside chair   Ambulation/Gait Ambulation/Gait assistance: Min assist Ambulation Distance (Feet): 26 Feet (16' then 10') Assistive device: Rolling walker (2 wheeled) Gait Pattern/deviations: Step-to pattern;Decreased stride length;Narrow base of support Gait velocity: decreased   General Gait Details: Pt. able to ambulate 16 feet before needing a seated rest, then completed 10 more feet to reach reclienr chair.  Needed increased time to ambulate and was antalgic at times with sudden hip pain   Stairs            Wheelchair Mobility    Modified Rankin (Stroke Patients Only)       Balance                                    Cognition Arousal/Alertness: Awake/alert Behavior During Therapy: WFL for tasks assessed/performed Overall Cognitive Status: Within Functional Limits for tasks assessed                      Exercises General Exercises - Lower Extremity Long Arc Quad: 5 reps;AROM;Both;Seated    General Comments        Pertinent Vitals/Pain Pain Assessment: 0-10 Pain Score: 4  Pain Location: right hip and thigh at rest  and with weightbearing Pain Descriptors / Indicators: Grimacing;Guarding;Discomfort;Sore Pain Intervention(s): Limited activity within patient's tolerance;Monitored during session;Repositioned (pt.states he will call RN if he wants pain med)    Home Living                      Prior Function            PT Goals (current goals can now be found in the care plan section) Progress towards PT goals: Progressing toward goals    Frequency  Min 5X/week    PT Plan Discharge plan needs to be updated    Co-evaluation             End of Session Equipment Utilized During Treatment: Gait belt Activity  Tolerance: Patient limited by pain;Patient limited by fatigue Patient left: in chair;with call bell/phone within reach;with chair alarm set (pt. on alarm pad, Sidney (NT) to bring alarm box)     Time: [id number] PT Time Calculation (min) (ACUTE ONLY): 27 min  Charges:  $Gait Training: 23-37 mins                    G Codes:      Ladona Ridgel 07/29/2014, 9:09 AM Gerlean Ren PT Acute Rehab Services [phone] Beeper [phone]

## 2014-07-29 NOTE — Clinical Social Work Placement (Addendum)
Clinical Social Work Department CLINICAL SOCIAL WORK PLACEMENT NOTE 07/29/2014  Patient:  [name], [name]  Account Number:  [id number] Admit date:  07/27/2014  Clinical Social Worker:  Gwenevere Abbot,  Date/time:  07/29/2014 12:00 M  Clinical Social Work is seeking post-discharge placement for this patient at the following level of care:   SKILLED NURSING   (*CSW will update this form in Epic as items are completed)   07/29/2014  Patient/family provided with [facility] Department of Clinical Social Work's list of facilities offering this level of care within the geographic area requested by the patient (or if unable, by the patient's family).  07/29/2014  Patient/family informed of their freedom to choose among providers that offer the needed level of care, that participate in Medicare, Medicaid or managed care program needed by the patient, have an available bed and are willing to accept the patient.  07/29/2014  Patient/family informed of [facility]' ownership interest in [facility], as well as of the fact that they are under no obligation to receive care at this facility.  PASARR submitted to EDS on 07/29/14 PASARR number received on 07/29/14  FL2 transmitted to all facilities in geographic area requested by pt/family on 07/29/14 FL2 transmitted to all facilities within larger geographic area on   Patient informed that his/her managed care company has contracts with or will negotiate with  certain facilities, including the following:     Patient/family informed of bed offers received: 07/30/14  Patient chooses bed at [facility] Physician recommends and patient chooses bed at    Patient to be transferred to [facility] on 07/31/14 Patient to be transferred to facility by ambulance Patient and family notified of transfer on 07/30/14 Name of family member notified:    The following physician request were entered in Epic:  Additional  Comments: 07/30/14: Additonal PT note from today sent to [facility] admissions staff person Drue Novel [phone]) to submit with other clinicals to [facility] for authorization.   Shelle Iron, LCSW 07/30/14 - Patient changed his mind from [facility] to [facility]. Both facilities contacted and all PT notes sent to [facility] for [employer].  Shelle Iron, CSW        Gwenevere Abbot - (Initiated Placement note) CSW-Intern [phone]

## 2014-07-29 NOTE — Progress Notes (Signed)
Thank you for consult on [name]. He does not have medical complexity to justify inpatient rehab stay. Question cardiac work up? Will defer CIR consult.

## 2014-07-29 NOTE — Clinical Social Work Psychosocial (Signed)
Clinical Social Work Department BRIEF PSYCHOSOCIAL ASSESSMENT 07/29/2014  Patient:  SHERILL, HARKER     Account Number:  [id number]     Ra[place] date:  07/27/2014  Clinical Social Worker:  Gwenevere Abbot,  Date/Time:  07/29/2014 02:43 PM  Referred by:  CSW  Date Referred:  07/29/2014 Referred for  SNF Placement   Other Referral:   Interview type:  Patient Other interview type:    PSYCHOSOCIAL DATA Living Status:  ALONE Admitted from facility:   Level of care:   Primary support name:  Tally Joe Primary support relationship to patient:  SIBLING Degree of support available:   Tally Joe  Sister  [phone]    CURRENT CONCERNS Current Concerns  Post-Acute Placement   Other Concerns:    SOCIAL WORK ASSESSMENT / PLAN CSW-Intern spoke with Mr.Kanode at bedside regarding short-term rehab. Patient is agreeable and prefers [facility] for his rehab. Mr.Delucchi doesn't have a preference for which facility , but stated he wants good care and people.   Assessment/plan status:  No Further Intervention Required Other assessment/ plan:   Information/referral to community resources:   Mr.Youngberg was provided with [facility] facility list.    PATIENT'S/FAMILY'S RESPONSE TO PLAN OF CARE: Patient was open and responsive speaking to CSW-Intern.       Gwenevere Abbot CSW-Intern [phone]

## 2014-07-30 LAB — GLUCOSE, CAPILLARY: Glucose-Capillary: 107 mg/dL — ABNORMAL HIGH (ref 70–99)

## 2014-07-30 NOTE — Progress Notes (Signed)
Physical Therapy Treatment Patient Details Name: [name] MRN: [id number] DOB: [birth year] Today's Date: 07/30/2014    History of Present Illness Pt is a 59 y.o. male with PMH of HTN, hyperlipidemia, GERD, CAD (s/p of CABG), aortic valve replacement with bovine valve, amputation of all toes, PVD, and DM, who presents with right hip pain after fall. Patient reports that he was in [place] shopping at about 12:30. He suddenly felt flushed, faint, and dizzy and then fell on the ground. He injured his right hip and developed pain over the right hip. He could not put weight on the right leg due to pain. His dizziness lasted for about 3-4 minutes. X-ray of right hip showed mildly displaced fracture involving the right greater femoral trochanter, without extension into the intertrochanteric region. Orthopedic surgeons recommended against surgery at this time.    PT Comments    Pt progressing towards physical therapy goals. Was able to perform transfers and ambulation with increased independence. Overall good rehab effort this session. Pt reports some cramping/spasms of the R quad, and pt was instructed in some exercises to both strength and stretch the quad. Will continue to follow.   Follow Up Recommendations  SNF;Supervision/Assistance - 24 hour     Equipment Recommendations  None recommended by PT    Recommendations for Other Services       Precautions / Restrictions Precautions Precautions: Fall Precaution Comments: no active hip abduction Restrictions Weight Bearing Restrictions: Yes RLE Weight Bearing: Weight bearing as tolerated    Mobility  Bed Mobility Overal bed mobility: Needs Assistance Bed Mobility: Supine to Sit     Supine to sit: Min guard     General bed mobility comments: Pt was able to transition to EOB with use of bed rails and increased time. Min guard for movement of RLE.  Transfers Overall transfer level: Needs assistance Equipment used: Rolling  walker (2 wheeled) Transfers: Sit to/from Stand Sit to Stand: Min guard         General transfer comment: Steadying assist only to power-up to full standing.   Ambulation/Gait Ambulation/Gait assistance: Min guard Ambulation Distance (Feet): 80 Feet Assistive device: Rolling walker (2 wheeled) Gait Pattern/deviations: Step-through pattern;Decreased stride length Gait velocity: decreased Gait velocity interpretation: Below normal speed for age/gender General Gait Details: Pt was able to progress ambulation distance this session, utilizing 3 standing rest breaks due to pain and fatigue. Gait is generally guarded and slow, however no LOB noted.    Stairs            Wheelchair Mobility    Modified Rankin (Stroke Patients Only)       Balance Overall balance assessment: Needs assistance Sitting-balance support: Feet supported;No upper extremity supported Sitting balance-Leahy Scale: Good     Standing balance support: Bilateral upper extremity supported;During functional activity Standing balance-Leahy Scale: Poor                      Cognition Arousal/Alertness: Awake/alert Behavior During Therapy: WFL for tasks assessed/performed Overall Cognitive Status: Within Functional Limits for tasks assessed                      Exercises General Exercises - Lower Extremity Quad Sets: 10 reps Long Arc Quad: 10 reps Hip ABduction/ADduction: 10 reps (Hip add isometrics only) Seated R quad stretch (seated on edge of chair with foot pulled back towards chair)    General Comments        Pertinent Vitals/Pain Pain  Assessment: Faces Faces Pain Scale: Hurts little more Pain Location: R hip (into the quad region) Pain Descriptors / Indicators: Aching;Cramping;Grimacing;Guarding Pain Intervention(s): Limited activity within patient's tolerance;Monitored during session;Repositioned;RN gave pain meds during session    Home Living                       Prior Function            PT Goals (current goals can now be found in the care plan section) Acute Rehab PT Goals Patient Stated Goal: Pt did not state this session but was agreeable to participating in therapy. PT Goal Formulation: With patient Time For Goal Achievement: 08/04/14 Potential to Achieve Goals: Good Progress towards PT goals: Progressing toward goals    Frequency  Min 3X/week    PT Plan Current plan remains appropriate;Frequency needs to be updated    Co-evaluation             End of Session Equipment Utilized During Treatment: Gait belt Activity Tolerance: Patient limited by pain;Patient limited by fatigue Patient left: in chair;with call bell/phone within reach;with chair alarm set     Time: [id number] PT Time Calculation (min) (ACUTE ONLY): 28 min  Charges:  $Gait Training: 8-22 mins $Therapeutic Activity: 8-22 mins                    G Codes:      [name] 2014-08-29, 12:00 PM  [name], PT, DPT Acute Rehabilitation Services Pager: [phone]

## 2014-07-30 NOTE — Clinical Social Work Note (Signed)
CSW informed by MD that patient will be ready for d/c tomorrow. He had chosen [facility] in [place] and they submitted but have not received insurance authorization. [facility] is agreeable to working with hospital to accept patient and [name] will d/c on Saturday, 3/26 to faclity for short-term rehab.  Handoff completed for weekend CSW regarding discharge.   [name], MSW, LCSW Licensed Clinical Social Worker [place] [phone]

## 2014-07-30 NOTE — Progress Notes (Signed)
[facility] PROGRESS NOTE  Sohan Ostling [id number] DOB: 05-20-1955 DOA: 07/27/2014 PCP: Everlean Alstrom, MD  Brief Summary  [name] is a 59 y.o. male with past medical history of hypertension, hyperlipidemia, GERD, coronary artery disease (s/p of CABG), aortic valve replacement with bovine valve, amputation of all toes, PVD, diabetes mellitus, who presents with right hip pain after fall.   Patient reports that he was in [place] shopping at about 12:30. He suddenly felt flushed, faint, and dizzy and then fell on the ground. He injured his right hip and developed pain over the right hip. He could not put weight on the right leg due to pain. His dizziness lasted for about 3-4 minutes. He did not lose consciousness. He had episode of nausea, bu no vomiting, diarrhea or abdominal pain. He denied vision changes, ear ringing, chest pain, shortness breath, palpitation, headache, unilateral weakness, numbness or tingling sensations. Patient denied fever, chills, cough, abdominal pain, diarrhea, constipation, dysuria, urgency, frequency, hematuria.   In ED, patient was found to have WBC 15.5, temperature normal, tachycardia, electrolytes okay, thrombocytopenia with platelet 101. CT head is negative for acute abnormalities. X-ray of right hip showed mildly displaced fracture involving the right greater femoral trochanter, without extension into the intertrochanteric region. Patient was admitted to inpatient for further evaluation and treatment. Orthopedic surgeons recommended against surgery at this time.  He is currently undergoing evaluation by PT/OT and further testing for cause of his presyncope.  Assessment/Plan  Right hip fracture, avulsion fracture of the greater trochanter:  - Pain control: percocet prn - Orthopedics evaluated and recommended WBAT and follow up with Orthopedics in 2 weeks for repeat XR -  PT/OT recommending SNF -  Insurance will not cover inpatient  rehab -  SW consult placed for SNF placement  Fall: Patient had episode of dizziness before having fall. No other symptoms, such as chest pain, palpitation or any signs of stroke.  Could this have been arrhythmia related? -  trop x 3:  neg -  Ekg:  Sinus tach with Q-waves in anterior leads -  Tele:  NSR with ventricular bigeminy and trigeminy -  Echo:  Possible new  Hypokinesis of the basal-mid-inferior myocardium -  Orthostatics deferred initially and post hydration were negative -  CXR:  No infiltrate -  UA neg for signs of infection -  Cardiology consult for possible event monitor  CAD: s/p of CABG. No chest pain -see above -Continue aspirin, metoprolol, Zocor  Porcine AVR, mild stenosis and functionally normally.  VTI 1.23, Vmax 1.13, V mean 1.31  Moderately elevated PA pressure 4mmHg  Leukocytosis: It is most likely due to stress-induced demargination, but patient has some skin tears with erythematous skin change over left shin. He also has thrombocytopenia, infection can not be completely ruled out. - blood culture x 2 NGTD -continue home topical silver sulfadiazine cream  Diabetes mellitus type 2, uncontrolled. - A1c 9.9  -  He is on Levemir, Farxiga and Tanzeum at home -  CBG stable -  continue levemir 45 units daily -  Continue SSI  Hypertension: -Continue Cozaar and metoprolol  GERD: -Protonix  Hyperlipidemia: LDL was a 45 on 03/01/09 -Continue Zocor -Check FLP  Lab Results  Component Value Date   CHOL 104 07/28/2014   HDL 34* 07/28/2014   LDLCALC 56 07/28/2014   TRIG 69 07/28/2014   CHOLHDL 3.1 07/28/2014   Nocturnal hypoxemia -  Needs outpatient sleep study  Mild to moderate thrombocytopenia -  Platelets approximately  stable, no anemia to suggest hemolysis -  No obvious bleeding and not a common side effect of any of his medications -  May be reactive to recent fall and fracture -  If persistent, 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  Possible d/c to SNF today depending on cardiology recommendations.    Consultants:  Orthopedics, Dr. Rolena Infante  Procedures:  3 pelvis  CT head  CT hip without contrast, right  Antibiotics:  None   HPI/Subjective:  having some mild nausea.  Still has severe pain in his right thigh with movement.    Objective: Filed Vitals:   07/29/14 1640 07/29/14 2129 07/29/14 2138 07/30/14 0519  BP: 140/71 133/67 133/67 139/67  Pulse: 82 90 89 84  Temp: 98.2 F (36.8 C)  98.2 F (36.8 C) 98.4 F (36.9 C)  TempSrc: Oral  Oral Oral  Resp: 18  17 16   Height:      Weight:   96.6 kg (212 lb 15.4 oz)   SpO2: 95%  93% 93%    Intake/Output Summary (Last 24 hours) at 07/30/14 0957 Last data filed at 07/30/14 0754  Gross per 24 hour  Intake   1220 ml  Output   1500 ml  Net   -280 ml   Filed Weights   07/28/14 0305 07/28/14 2100 07/29/14 2138  Weight: 95.3 kg (210 lb 1.6 oz) 95.7 kg (210 lb 15.7 oz) 96.6 kg (212 lb 15.4 oz)    Exam:   General:  Adult male, No acute distress  HEENT:  NCAT, MMM  Cardiovascular:  RRR, nl S1, S2, 3/6 systolic murmur, no rubs or gallops, 2+ radial pulses, warm extremities  Respiratory:  CTAB, no increased WOB  Abdomen:   NABS, soft, NT/ND  MSK:   Normal tone and bulk, 1+ bilateral pitting LEE.  Knotted muscle in right quad.  No knee effusions and pain along joint line.  Minimal bruising and swelling lateral right hip Neuro:   Strength 5/5 bilateral upper extremities and left lower extremity.  Knee and hip RLE limited by pain.  Ankle 5/5.    Data Reviewed: Basic Metabolic Panel:  Recent Labs Lab 07/27/14 2142 07/28/14 0052 07/28/14 0522  NA  140 139 140  K 5.1 4.8 5.0  CL 102 107 109  CO2  --  27 27  GLUCOSE 220* 213* 177*  BUN 21 20 21   CREATININE 1.10 1.27 1.24  CALCIUM  --  9.2 8.5   Liver Function Tests: No results for input(s): AST, ALT, ALKPHOS, BILITOT, PROT, ALBUMIN in the last 168 hours. No results for input(s): LIPASE, AMYLASE in the last 168 hours. No results for input(s): AMMONIA in the last 168 hours. CBC:  Recent Labs Lab 07/27/14 2133 07/27/14 2142 07/28/14 0522  WBC 15.5*  --  12.4*  NEUTROABS 13.5*  --   --   HGB 15.3 16.3 13.4  HCT 45.8 48.0 40.7  MCV 85.9  --  85.9  PLT 101*  --  94*   Cardiac Enzymes:  Recent Labs Lab 07/28/14 0522 07/28/14 0925 07/28/14 1435  TROPONINI 0.03 <0.03 0.03   BNP (last 3 results) No results for input(s): BNP in the last 8760 hours.  ProBNP (  last 3 results) No results for input(s): PROBNP in the last 8760 hours.  CBG:  Recent Labs Lab 07/28/14 0413 07/28/14 0805 07/28/14 1630 07/29/14 0758 07/30/14 0753  GLUCAP 185* 142* 139* 106* 107*    Recent Results (from the past 240 hour(s))  MRSA PCR Screening     Status: None   Collection Time: 07/28/14  4:33 AM  Result Value Ref Range Status   MRSA by PCR NEGATIVE NEGATIVE Final    Comment:        The GeneXpert MRSA Assay (FDA approved for NASAL specimens only), is one component of a comprehensive MRSA colonization surveillance program. It is not intended to diagnose MRSA infection nor to guide or monitor treatment for MRSA infections.   Culture, blood (routine x 2)     Status: None (Preliminary result)   Collection Time: 07/28/14  5:22 AM  Result Value Ref Range Status   Specimen Description BLOOD LEFT ARM  Final   Special Requests BOTTLES DRAWN AEROBIC AND ANAEROBIC 8CC  Final   Culture   Final           BLOOD CULTURE RECEIVED NO GROWTH TO DATE CULTURE WILL BE HELD FOR 5 DAYS BEFORE ISSUING A FINAL NEGATIVE REPORT Performed at [facility]    Report Status PENDING  Incomplete   Culture, blood (routine x 2)     Status: None (Preliminary result)   Collection Time: 07/28/14  5:29 AM  Result Value Ref Range Status   Specimen Description BLOOD LEFT WRIST  Final   Special Requests BOTTLES DRAWN AEROBIC AND ANAEROBIC 5CC  Final   Culture   Final           BLOOD CULTURE RECEIVED NO GROWTH TO DATE CULTURE WILL BE HELD FOR 5 DAYS BEFORE ISSUING A FINAL NEGATIVE REPORT Performed at [facility]    Report Status PENDING  Incomplete     Studies: Dg Chest Port 1 View  07/28/2014   CLINICAL DATA:  Dizziness  EXAM: PORTABLE CHEST - 1 VIEW  COMPARISON:  07/25/2011  FINDINGS: Low lung volumes with left basilar atelectasis. Heart size is borderline, accentuated by the low volumes and portable AP nature of the study. No confluent opacity on the right. No effusions. No acute bony abnormality.  IMPRESSION: Low lung volumes with left base atelectasis.   Electronically Signed   By: Rolm Baptise M.D.   On: 07/28/2014 12:23    Scheduled Meds: . antiseptic oral rinse  7 mL Mouth Rinse BID  . aspirin  325 mg Oral Daily  . calcium-vitamin D  1 tablet Oral Q breakfast  . heparin  5,000 Units Subcutaneous 3 times per day  . insulin detemir  45 Units Subcutaneous QHS  . loratadine  10 mg Oral Daily  . losartan  50 mg Oral q morning - 10a  . metoprolol tartrate  100 mg Oral Daily   And  . metoprolol tartrate  50 mg Oral QHS  . multivitamin with minerals  1 tablet Oral Daily  . pantoprazole  40 mg Oral Daily  . silver sulfADIAZINE  1 application Topical Daily  . simvastatin  20 mg Oral q1800  . sodium chloride  3 mL Intravenous Q12H   Continuous Infusions:    Principal Problem:   Hip fracture, right Active Problems:   Essential hypertension   CAD (coronary artery disease)   Peripheral vascular disease   H/O aortic valve replacement   Greater trochanter fracture   Diabetes mellitus without complication  HLD (hyperlipidemia)   GERD (gastroesophageal reflux disease)    Hip fracture   Fall   Leukocytosis   Thrombocytopenia    Time spent: 30 min    Clearnce Leja, [facility] Pager [phone]. If 7PM-7AM, please contact night-coverage at [email or web address], password [facility] 07/30/2014, 9:57 AM  LOS: 2 days

## 2014-07-31 DIAGNOSIS — D72829 Elevated white blood cell count, unspecified: Secondary | ICD-10-CM

## 2014-07-31 LAB — GLUCOSE, CAPILLARY: Glucose-Capillary: 96 mg/dL (ref 70–99)

## 2014-07-31 MED ORDER — OXYCODONE-ACETAMINOPHEN 5-325 MG PO TABS: 1.0000 | ORAL_TABLET | ORAL | Status: DC | PRN

## 2014-07-31 MED ORDER — LEVEMIR FLEXTOUCH 100 UNIT/ML ~~LOC~~ SOPN: 45.0000 [IU] | PEN_INJECTOR | Freq: Every day | SUBCUTANEOUS | Status: DC

## 2014-07-31 NOTE — Discharge Summary (Signed)
Physician Discharge Summary  [name] [id number] DOB: 1955-11-27 DOA: 07/27/2014  PCP: Everlean Alstrom, MD  Admit date: 07/27/2014 Discharge date: 07/31/2014  Recommendations for Outpatient Follow-up:  1. Orthopedics follow up in 2 weeks for repeat XR.  Patient to call to schedule appointment 2. WBAT right lower extremity 3. Cardiology to call for an appointment to schedule outpatient monitor to screen for symptomatic arrhythmia 4. To SNF for ongoing PT/OT 5. PCP to please refer for outpatient sleep study 6. Repeat BMP and CBC as outpatient to follow up creatinine and platelets.  Referral to hematology if thrombocytopenia persists  Discharge Diagnoses:  Principal Problem:   Hip fracture, right Active Problems:   Essential hypertension   CAD (coronary artery disease)   Peripheral vascular disease   H/O aortic valve replacement   Greater trochanter fracture   Diabetes mellitus without complication   HLD (hyperlipidemia)   GERD (gastroesophageal reflux disease)   Hip fracture   Fall   Leukocytosis   Thrombocytopenia   Discharge Condition: stable, improved  Diet recommendation: healthy heart, diabetic  Wt Readings from Last 3 Encounters:  07/30/14 97.1 kg (214 lb 1.1 oz)  10/16/11 99.338 kg (219 lb)  07/27/11 102.2 kg (225 lb 5 oz)    History of present illness:   [name] is a 59 y.o. male with past medical history of hypertension, hyperlipidemia, GERD, coronary artery disease (s/p of CABG), aortic valve replacement with bovine valve, amputation of all toes, PVD, diabetes mellitus, who presented with right hip pain after fall.   In [place], he suddenly felt flushed, faint, and dizzy and then fell on the ground. He injured his right hip and developed pain over the right hip. His dizziness lasted for about 3-4 minutes. He did not lose consciousness. He had episode of nausea, but no vomiting, diarrhea or abdominal pain. He denied vision changes, ear  ringing, chest pain, shortness breath, palpitation, headache, unilateral weakness, numbness or tingling sensations.  X-ray of right hip showed mildly displaced fracture involving the right greater femoral trochanter, without extension into the intertrochanteric region. He was admitted to inpatient for further evaluation and treatment.  Orthopedic surgeons recommended against surgery. He transferring to SNF for ongoing PT/OT.  Cardiology will follow up with him as an outpatient.      Hospital Course:   Right hip fracture, avulsion fracture of the greater trochanter:  - Pain control: percocet prn - Orthopedics evaluated and recommended WBAT and follow up with Orthopedics in 2 weeks for repeat XR - PT/OT recommended SNF for ongoing therapy  Presyncope and fall:  Unclear what caused near-fainting spell since there were no signs of underlying infection.  He may have been a little dehydrated, but orthostatics were not obtained at admission due to hip fracture.  He has known CAD with bigeminy and trigimeny on telemetry and is at risk for arrhythmias given his CAD.    - trop x 3: neg - Ekg: Sinus tach with Q-waves in anterior leads - Tele: NSR with ventricular bigeminy and trigeminy - Echo: Possible newhypokinesis of the basal-mid-inferior myocardium - Orthostatics deferred initially and post hydration were negative - CXR: No infiltrate - UA neg for signs of infection - Cardiology to meet with patient as outpatient for further evaluation and for possible event monitor  CAD: s/p of CABG. No chest pain -Continued aspirin, metoprolol, Zocor  Porcine AVR, mild stenosis and functionally normally. VTI 1.23, Vmax 1.13, V mean 1.31  Moderately elevated PA pressure 39mmHg  Leukocytosis:  It is most likely due to stress-induced demargination.  UA and CXR unremarkable - blood culture x 2 NGTD  Diabetes mellitus type 2, uncontrolled. 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 Rolena Infante  Procedures:  XR pelvis  CT head  CT hip without contrast, right  Antibiotics:  None  Discharge Exam: Filed Vitals:   07/31/14 0823  BP: 137/64  Pulse: 87  Temp: 98.6 F (37 C)  Resp: 18   Filed Vitals:   07/30/14 2206 07/30/14 2208 07/31/14 0412 07/31/14 0823  BP: 132/65 132/65 123/63 137/64  Pulse: 91 90 88 87  Temp: 98.1 F (36.7 C)  98.5 F (36.9 C) 98.6 F (37 C)  TempSrc: Oral  Oral Oral  Resp: 18  17 18   Height:      Weight: 97.1 kg (214 lb 1.1 oz)     SpO2: 93%  94% 93%     General: Adult male, No acute distress  HEENT: NCAT, MMM  Cardiovascular: RRR, nl S1, S2, 3/6 systolic murmur, no rubs or gallops, 2+ radial pulses, warm extremities  Respiratory: CTAB, no increased WOB  MSK: Normal tone and bulk, 1+ bilateral pitting LEE. Knotted muscle in right quad. No knee effusions and pain along joint line. Minimal bruising and swelling lateral right hip  Discharge Instructions      Discharge Instructions    Call MD for:  difficulty breathing, headache or visual disturbances    Complete by:  As directed      Call MD for:  extreme fatigue    Complete by:  As directed      Call MD for:  hives    Complete by:  As directed      Call MD for:  persistant dizziness or  light-headedness    Complete by:  As directed      Call MD for:  persistant nausea and vomiting    Complete by:  As directed      Call MD for:  severe uncontrolled pain    Complete by:  As directed      Call MD for:  temperature >100.4    Complete by:  As directed      Diet - low sodium heart healthy    Complete by:  As directed      Diet Carb Modified    Complete by:  As directed      Increase activity slowly    Complete by:  As directed      Weight bearing as tolerated    Complete by:  As directed             Medication List    TAKE these medications        aspirin 325 MG tablet  Take 325 mg by mouth daily.     calcium-vitamin D 500-200 MG-UNIT  per tablet  Commonly known as:  OSCAL WITH D  Take 1 tablet by mouth daily.     cetirizine 10 MG tablet  Commonly known as:  ZYRTEC  Take 10 mg by mouth daily.     FARXIGA 10 MG Tabs tablet  Generic drug:  dapagliflozin propanediol  Take 10 mg by mouth daily.     latanoprost 0.005 % ophthalmic solution  Commonly known as:  XALATAN  Place 1 drop into both eyes at bedtime.     LEVEMIR FLEXTOUCH 100 UNIT/ML Pen  Generic drug:  Insulin Detemir  Inject 45 Units into the skin at bedtime.     losartan 50 MG tablet  Commonly known as:  COZAAR  Take 50 mg by mouth every morning.     metoprolol 50 MG tablet  Commonly known as:  LOPRESSOR  Take 50-100 mg by mouth 2 (two) times daily. Takes 100mg  in amand 50mg  in pm     multivitamin tablet  Take 1 tablet by mouth daily.     ondansetron 4 MG tablet  Commonly known as:  ZOFRAN  Take 4 mg by mouth every 8 (eight) hours as needed for nausea or vomiting.     oxyCODONE-acetaminophen 5-325 MG per tablet  Commonly known as:  PERCOCET/ROXICET  Take 1 tablet by mouth every 4 (four) hours as needed for severe pain.     pantoprazole 40 MG tablet  Commonly known as:  PROTONIX  Take 40 mg by mouth daily.     silver sulfADIAZINE 1 % cream  Commonly known as:  SILVADENE  Apply 1  application topically as needed. For wound care     simvastatin 20 MG tablet  Commonly known as:  ZOCOR  Take 20 mg by mouth daily at 6 PM.     TANZEUM 50 MG Pen  Generic drug:  Albiglutide  Inject 50 mg into the skin every Monday.       Follow-up Information    Follow up with Swinteck, Horald Pollen, MD. Schedule an appointment as soon as possible for a visit in 2 weeks.   Specialty:  Orthopedic Surgery   Why:  fracture follow up   Contact information:   [place] [address] [phone]       Follow up with Everlean Alstrom, MD. Schedule an appointment as soon as possible for a visit in 2 weeks.   Specialties:  Optician, dispensing, Emergency Medicine   Contact information:   [address]. [address] [place] [address] [phone]       Follow up with [facility].   Specialty:  Cardiology   Why:  you will be called to schedule appointment time and date   Contact information:   [address] [place] [phone]       The results of significant diagnostics from this hospitalization (including imaging, microbiology, ancillary and laboratory) are listed below for reference.    Significant Diagnostic Studies: Ct Head Wo Contrast  07/27/2014   CLINICAL DATA:  Acute onset of dizziness and fall. Vomiting. Initial encounter.  EXAM: CT HEAD WITHOUT CONTRAST  TECHNIQUE: Contiguous axial images were obtained from the base of the skull through the vertex without intravenous contrast.  COMPARISON:  None.  FINDINGS: There is no evidence of acute infarction, mass lesion, or intra- or extra-axial hemorrhage on CT.  Prominence of the ventricles and sulci reflects mild cortical volume loss. Mild cerebellar  atrophy is noted. Mild periventricular white matter change likely reflects small vessel ischemic microangiopathy.  The brainstem and fourth ventricle are within  normal limits. The basal ganglia are unremarkable in appearance. The cerebral hemispheres demonstrate grossly normal gray-white differentiation. No mass effect or midline shift is seen.  There is no evidence of fracture; visualized osseous structures are unremarkable in appearance. The orbits are within normal limits. Mucosal thickening is noted at the maxillary sinuses bilaterally. The remaining paranasal sinuses and mastoid air cells are well-aerated. No significant soft tissue abnormalities are seen.  IMPRESSION: 1. No evidence of traumatic intracranial injury or fracture. 2. Mild cortical volume loss and scattered small vessel ischemic microangiopathy. 3. Mucosal thickening at the maxillary sinuses bilaterally.   Electronically Signed   By: Garald Balding M.D.   On: 07/27/2014 23:58   Ct Hip Right Wo Contrast  07/28/2014   CLINICAL DATA:  Evaluate known right greater femoral trochanter fracture. Status post fall, with right hip pain. Initial encounter.  EXAM: CT OF THE RIGHT HIP WITHOUT CONTRAST  TECHNIQUE: Multidetector CT imaging of the right hip was performed according to the standard protocol. Multiplanar CT image reconstructions were also generated.  COMPARISON:  Right hip radiographs performed 07/27/2014  FINDINGS: There is a mildly displaced fracture involving the superior aspect of the right greater femoral trochanter. This appears to extend only to the base of the greater femoral trochanter, adjacent to the femoral neck. The femoral neck appears intact. There is no evidence of intertrochanteric extension. Mild overlying soft tissue injury is noted. No hip joint effusion is seen.  The right femoral head remains seated at the acetabulum. The right superior and inferior pubic rami appear intact. The right acetabulum is unremarkable in appearance. The right sacroiliac joint is unremarkable in appearance. The pubic symphysis is within normal limits.  Scattered vascular calcifications are seen. The bladder  is significantly distended and grossly unremarkable. The musculature of the proximal right thigh appears intact.  IMPRESSION: Mildly displaced fracture involving the superior aspect of the right greater femoral trochanter, possibly reflecting a large avulsion fragment. This appears to extend only to the base of the greater femoral trochanter, adjacent to the femoral neck. The femoral neck appears intact. No evidence of intertrochanteric extension. Mild overlying soft tissue injury noted.   Electronically Signed   By: Garald Balding M.D.   On: 07/28/2014 02:57   Dg Chest Port 1 View  07/28/2014   CLINICAL DATA:  Dizziness  EXAM: PORTABLE CHEST - 1 VIEW  COMPARISON:  07/25/2011  FINDINGS: Low lung volumes with left basilar atelectasis. Heart size is borderline, accentuated by the low volumes and portable AP nature of the study. No confluent opacity on the right. No effusions. No acute bony abnormality.  IMPRESSION: Low lung volumes with left base atelectasis.   Electronically Signed   By: Rolm Baptise M.D.   On: 07/28/2014 12:23   Dg Hip Unilat With Pelvis 2-3 Views Right  07/27/2014   CLINICAL DATA:  Status post fall at [employer]. Right hip pain. Initial encounter.  EXAM: RIGHT HIP (WITH PELVIS) 2-3 VIEWS  COMPARISON:  None.  FINDINGS: There appears to be a mildly displaced fracture involving the right greater femoral trochanter, without extension into the intertrochanteric region. The fragment is relatively prominent, though given its location, this likely reflects an acute avulsion injury.  The right hip joint is unremarkable in appearance. There is slight flattening of the left femoral head, degenerative in nature. Mild degenerative change is noted at the  lower lumbar spine.  Diffuse vascular calcifications are seen. The sacroiliac joints are grossly unremarkable, aside from mild sclerotic change. The visualized bowel gas pattern is within normal limits.  IMPRESSION: 1. Mildly displaced fracture involving the  right greater femoral trochanter, without extension into the intertrochanteric region. The fragment is relatively prominent, though given its location, this likely reflects an acute avulsion injury. 2. Diffuse vascular calcifications seen.   Electronically Signed   By: Garald Balding M.D.   On: 07/27/2014 23:52   Dg Femur, Min 2 Views Right  07/27/2014   CLINICAL DATA:  Status post fall at [facility], with right thigh pain. Initial encounter.  EXAM: RIGHT FEMUR 2 VIEWS  COMPARISON:  None.  FINDINGS: There appears to be a mildly displaced fracture involving the right greater femoral trochanter, without evidence of intertrochanteric extension. The right femoral head remains seated at the acetabulum. The right knee joint is grossly unremarkable. No knee joint effusion is identified.  Scattered vascular calcifications are seen.  IMPRESSION: 1. Mildly displaced fracture involving the right greater femoral trochanter, without evidence of intertrochanteric extension. 2. Scattered vascular calcifications seen.   Electronically Signed   By: Garald Balding M.D.   On: 07/27/2014 23:47    Microbiology: Recent Results (from the past 240 hour(s))  MRSA PCR Screening     Status: None   Collection Time: 07/28/14  4:33 AM  Result Value Ref Range Status   MRSA by PCR NEGATIVE NEGATIVE Final    Comment:        The GeneXpert MRSA Assay (FDA approved for NASAL specimens only), is one component of a comprehensive MRSA colonization surveillance program. It is not intended to diagnose MRSA infection nor to guide or monitor treatment for MRSA infections.   Culture, blood (routine x 2)     Status: None (Preliminary result)   Collection Time: 07/28/14  5:22 AM  Result Value Ref Range Status   Specimen Description BLOOD LEFT ARM  Final   Special Requests BOTTLES DRAWN AEROBIC AND ANAEROBIC 8CC  Final   Culture   Final           BLOOD CULTURE RECEIVED NO GROWTH TO DATE CULTURE WILL BE HELD FOR 5 DAYS BEFORE ISSUING A  FINAL NEGATIVE REPORT Performed at [facility]    Report Status PENDING  Incomplete  Culture, blood (routine x 2)     Status: None (Preliminary result)   Collection Time: 07/28/14  5:29 AM  Result Value Ref Range Status   Specimen Description BLOOD LEFT WRIST  Final   Special Requests BOTTLES DRAWN AEROBIC AND ANAEROBIC 5CC  Final   Culture   Final           BLOOD CULTURE RECEIVED NO GROWTH TO DATE CULTURE WILL BE HELD FOR 5 DAYS BEFORE ISSUING A FINAL NEGATIVE REPORT Performed at [facility]    Report Status PENDING  Incomplete     Labs: Basic Metabolic Panel:  Recent Labs Lab 07/27/14 2142 07/28/14 0052 07/28/14 0522  NA 140 139 140  K 5.1 4.8 5.0  CL 102 107 109  CO2  --  27 27  GLUCOSE 220* 213* 177*  BUN 21 20 21   CREATININE 1.10 1.27 1.24  CALCIUM  --  9.2 8.5   Liver Function Tests: No results for input(s): AST, ALT, ALKPHOS, BILITOT, PROT, ALBUMIN in the last 168 hours. No results for input(s): LIPASE, AMYLASE in the last 168 hours. No results for input(s): AMMONIA in the last 168 hours.  CBC:  Recent Labs Lab 07/27/14 2133 07/27/14 2142 07/28/14 0522  WBC 15.5*  --  12.4*  NEUTROABS 13.5*  --   --   HGB 15.3 16.3 13.4  HCT 45.8 48.0 40.7  MCV 85.9  --  85.9  PLT 101*  --  94*   Cardiac Enzymes:  Recent Labs Lab 07/28/14 0522 07/28/14 0925 07/28/14 1435  TROPONINI 0.03 <0.03 0.03   BNP: BNP (last 3 results) No results for input(s): BNP in the last 8760 hours.  ProBNP (last 3 results) No results for input(s): PROBNP in the last 8760 hours.  CBG:  Recent Labs Lab 07/28/14 0413 07/28/14 0805 07/28/14 1630 07/29/14 0758 07/30/14 0753  GLUCAP 185* 142* 139* 106* 107*    Time coordinating discharge: 35 minutes  Signed:  Kveon Casanas  [facility] 07/31/2014, 9:12 AM

## 2014-07-31 NOTE — Clinical Social Work Note (Addendum)
Clinical Social Worker facilitated patient discharge including contacting patient family (pt reported he will contact his family with discharge plans) and facility to confirm patient discharge plans.  Clinical information faxed to facility and family agreeable with plan.  CSW arranged ambulance transport via [facility] to [facility].  RN to call report prior to discharge.  DC packet prepared and on chart for transport.   Clinical Social Worker will sign off for now as social work intervention is no longer needed. Please consult [place] again if new need arises.  Glendon Axe, MSW, LCSWA [phone] 07/31/2014 9:44 AM

## 2014-07-31 NOTE — Progress Notes (Signed)
Called report to [place] a [facility] in [place].

## 2014-07-31 NOTE — Progress Notes (Signed)
[facility] on floor, pt leaving floor via stretcher to [facility] [place].

## 2014-08-03 LAB — CULTURE, BLOOD (ROUTINE X 2)
CULTURE: NO GROWTH
Culture: NO GROWTH

## 2014-08-24 ENCOUNTER — Encounter: Payer: Self-pay | Admitting: Nurse Practitioner

## 2014-08-24 ENCOUNTER — Ambulatory Visit (INDEPENDENT_AMBULATORY_CARE_PROVIDER_SITE_OTHER): Payer: BLUE CROSS/BLUE SHIELD | Admitting: Nurse Practitioner

## 2014-08-24 VITALS — BP 128/80 | HR 88 | Ht 70.0 in | Wt 206.1 lb

## 2014-08-24 DIAGNOSIS — R55 Syncope and collapse: Secondary | ICD-10-CM | POA: Diagnosis not present

## 2014-08-24 DIAGNOSIS — Q231 Congenital insufficiency of aortic valve: Secondary | ICD-10-CM

## 2014-08-24 DIAGNOSIS — I10 Essential (primary) hypertension: Secondary | ICD-10-CM

## 2014-08-24 DIAGNOSIS — I25810 Atherosclerosis of coronary artery bypass graft(s) without angina pectoris: Secondary | ICD-10-CM

## 2014-08-24 DIAGNOSIS — E785 Hyperlipidemia, unspecified: Secondary | ICD-10-CM | POA: Insufficient documentation

## 2014-08-24 NOTE — Patient Instructions (Signed)
Medication Instructions:  Your physician recommends that you continue on your current medications as directed. Please refer to the Current Medication list given to you today.  Labwork: NONE  Testing/Procedures: NONE  Follow-Up: Your physician wants you to follow-up in: 6 months with Dr. Percival Spanish. You will receive a reminder letter in the mail two months in advance. If you don't receive a letter, please call our office to schedule the follow-up appointment.  Any Other Special Instructions Will Be Listed Below (If Applicable).

## 2014-08-24 NOTE — Progress Notes (Signed)
Patient Name: [name] Date of Encounter: 08/24/2014  Primary Care Provider:  Everlean Alstrom, MD Primary Cardiologist:  Lenna Sciara. Hochrein, MD   Chief Complaint  59 year old male with a prior history of CAD status post coronary artery bypass grafting along with aortic valve replacement who presents following recent hospitalization secondary to presyncope.  Past Medical History   Past Medical History  Diagnosis Date  . CAD (coronary artery disease) October 2010    a. 02/2009 NSTEMI/Cath: 3VD; b. 02/2009 CABG x 5: LIMA->LAD, VG->Diag, VG->RI, VG->RVM->RPDA.  Marland Kitchen Hyperlipidemia   . Hypertension   . PVD (peripheral vascular disease)     a. s/p toe amputations on L foot.  . Diabetes mellitus   . Bicuspid aortic valve     a. 02/2009 s/p tissue AVR;  b. 07/2014 Echo: EF 55-60%, basal-mid inf HK, mild AS/MR, mod dil LA, PASP 44 mmHg.  Marland Kitchen GERD (gastroesophageal reflux disease)    Past Surgical History  Procedure Laterality Date  . Amputation      Left great and second toes 2011  . Amputation      Left great and second toes 2011  . Aortic valve replacement      Pericardial tissue valve  . Coronary artery bypass graft  10  . Amputation  07/27/2011    Procedure: AMPUTATION FOOT;  Surgeon: Newt Minion, MD;  Location: [place];  Service: Orthopedics;  Laterality: Left;  Left Midfoot Amputation     Allergies  Allergies  Allergen Reactions  . Ace Inhibitors Cough    HPI  59 year old male with the above problem list. He was recently admitted to [facility] in late March, after he developed presyncope while walking in [place]. He reports that he had sudden onset of lightheadedness and weakness while in [employer]. He does not believe that he lost consciousness but did fall to the floor. He was taken to [facility] and reported right leg pain. He was found to have a mildly displaced right greater trochanter fracture. He was seen by orthopedics with recommendation for conservative therapy.  He did not apparently have any arrhythmia while on the monitor. Echocardiogram was performed and showed normal LV function. Since his discharge, he has done exceptionally well. He has some right leg discomfort but is ambulating without difficulty. He denies any recurrent presyncope, chest pain, dyspnea, PND, orthopnea, palpitations, dizziness, or edema.  Home Medications  Prior to Admission medications   Medication Sig Start Date End Date Taking? Authorizing Provider  aspirin 325 MG tablet Take 325 mg by mouth daily.     Yes Historical Provider, MD  calcium-vitamin D (OSCAL WITH D) 500-200 MG-UNIT per tablet Take 1 tablet by mouth daily.   Yes Historical Provider, MD  cetirizine (ZYRTEC) 10 MG tablet Take 10 mg by mouth daily.   Yes Historical Provider, MD  FARXIGA 10 MG TABS tablet Take 10 mg by mouth daily. 07/23/14  Yes Historical Provider, MD  latanoprost (XALATAN) 0.005 % ophthalmic solution Place 1 drop into both eyes at bedtime. 07/13/14  Yes Historical Provider, MD  losartan (COZAAR) 50 MG tablet Take 50 mg by mouth every morning. 07/19/14  Yes Historical Provider, MD  metoprolol (LOPRESSOR) 50 MG tablet Take 50-100 mg by mouth 2 (two) times daily. Takes 100mg  in amand 50mg  in pm   Yes Historical Provider, MD  Multiple Vitamin (MULTIVITAMIN) tablet Take 1 tablet by mouth daily.     Yes Historical Provider, MD  ondansetron (ZOFRAN) 4 MG tablet Take 4  mg by mouth every 8 (eight) hours as needed for nausea or vomiting.   Yes Historical Provider, MD  oxyCODONE-acetaminophen (PERCOCET/ROXICET) 5-325 MG per tablet Take 1 tablet by mouth every 4 (four) hours as needed for severe pain. 07/31/14  Yes Janece Canterbury, MD  pantoprazole (PROTONIX) 40 MG tablet Take 40 mg by mouth daily.     Yes Historical Provider, MD  silver sulfADIAZINE (SILVADENE) 1 % cream Apply 1 application topically as needed. For wound care 07/13/14  Yes Historical Provider, MD  simvastatin (ZOCOR) 20 MG tablet Take 20 mg by mouth  daily. 08/17/14  Yes Historical Provider, MD  TANZEUM 50 MG PEN Inject 50 mg into the skin every Monday. 07/23/14  Yes Historical Provider, MD  TOUJEO SOLOSTAR 300 UNIT/ML SOPN Inject 60 Units into the skin at bedtime. 08/17/14  Yes Historical Provider, MD    Review of Systems  As above, he did have presyncope leading to hospitalization in late March. He has had some right-sided leg pain but this is improving. He denies chest pain, palpitations, PND, dizziness, orthopnea, or dyspnea on exertion.  He occasionally experiences lower extremity edema which appears to be dependent in nature. All other systems reviewed and are otherwise negative except as noted above.  Physical Exam  VS:  BP 128/80 mmHg  Pulse 88  Ht 5\' 10"  (1.778 m)  Wt 206 lb 1.9 oz (93.495 kg)  BMI 29.58 kg/m2 , BMI Body mass index is 29.58 kg/(m^2). GEN: Well nourished, well developed, in no acute distress. HEENT: normal. Neck: Supple, no JVD or masses. Systolic murmur radiates to the neck bilaterally. Cardiac: RRR, 2/6 systolic ejection murmur at bilateral upper sternal borders radiating to the neck. No rubs, or gallops. No clubbing, cyanosis. Trace bilateral lower extremity edema with chronic venous stasis changes to his skin.  Radials/DP/PT 1 + and equal bilaterally.  Respiratory:  Respirations regular and unlabored, clear to auscultation bilaterally. GI: Soft, nontender, nondistended, BS + x 4. MS: no deformity or atrophy. Skin: warm and dry, no rash. Neuro:  Strength and sensation are intact. Psych: Normal affect.  Accessory Clinical Findings  ECG - regular sinus rhythm, 88, left axis, poor R-wave progression, LVH, no acute ST or T changes.  Assessment & Plan  1.  Presyncope: Patient was recently admitted to [facility] following an episode of presyncope that occurred at [facility]. Echocardiogram during admission showed normal LV function. He did not have any significant arrhythmias while hospitalized. He has had no  recurrence of presyncope. I offered him an event monitor however at this time, he would like to forego monitoring but would be willing to wear monitor if he were to have any recurrence of dizziness or lightheadedness. I advised him to call us if this occurs.  2. Right greater trochanter fracture: This is being conservatively managed by orthopedics and he is doing well.  3. Coronary artery disease: He is relatively active without chest pain or dyspnea. He remains on aspirin and beta blocker statin and ARB therapy. I recommend that he reduce his aspirin dose to 81 mg daily.  4. Hypertension: This is well controlled on beta blocker and ARB therapy.  5. Hyperlipidemia: LDL was 56 in March with normal LFTs. Continue statin therapy.  6. Type 2 diabetes mellitus: He is followed closely by primary care.  7. History of bicuspid aortic valve: Status post bioprosthetic aortic valve replacement. Mild aortic stenosis on recent echo.  8. Disposition: Patient will contact us if he has any recurrence of lightheadedness  or dizziness at which point, we can arrange for event monitoring. Otherwise, follow up with Dr. Percival Spanish in 6 mos or sooner if necessary.  [name], NP 08/24/2014, 10:26 AM

## 2014-12-07 ENCOUNTER — Encounter (HOSPITAL_COMMUNITY): Payer: Self-pay | Admitting: *Deleted

## 2014-12-07 ENCOUNTER — Other Ambulatory Visit (HOSPITAL_COMMUNITY): Payer: Self-pay | Admitting: Orthopedic Surgery

## 2014-12-07 NOTE — Progress Notes (Signed)
I have been unable to get in touch with [name]. I spoke with Dr Lissa Hoard about patient's past medical history including  CAD, DM Thrombocytopenia.  Dr Lissa Hoard gave instruction to have patient arrive 3 hours early, no solid foods after 6:00am and clear liquids until 10am, then NPO.

## 2014-12-08 ENCOUNTER — Inpatient Hospital Stay (HOSPITAL_COMMUNITY): Payer: BLUE CROSS/BLUE SHIELD | Admitting: Certified Registered Nurse Anesthetist

## 2014-12-08 ENCOUNTER — Encounter (HOSPITAL_COMMUNITY): Payer: Self-pay | Admitting: *Deleted

## 2014-12-08 ENCOUNTER — Inpatient Hospital Stay (HOSPITAL_COMMUNITY)
Admission: RE | Admit: 2014-12-08 | Discharge: 2014-12-10 | DRG: 240 | Disposition: A | Discharge status: Home Health | Payer: BLUE CROSS/BLUE SHIELD | Source: Ambulatory Visit | Attending: Orthopedic Surgery | Admitting: Orthopedic Surgery

## 2014-12-08 ENCOUNTER — Encounter (HOSPITAL_COMMUNITY)
Admission: RE | Disposition: A | Discharge status: Home Health | Payer: Self-pay | Source: Ambulatory Visit | Attending: Orthopedic Surgery

## 2014-12-08 ENCOUNTER — Inpatient Hospital Stay (HOSPITAL_COMMUNITY): Payer: BLUE CROSS/BLUE SHIELD

## 2014-12-08 DIAGNOSIS — Z8249 Family history of ischemic heart disease and other diseases of the circulatory system: Secondary | ICD-10-CM | POA: Diagnosis not present

## 2014-12-08 DIAGNOSIS — Z951 Presence of aortocoronary bypass graft: Secondary | ICD-10-CM | POA: Diagnosis not present

## 2014-12-08 DIAGNOSIS — M869 Osteomyelitis, unspecified: Secondary | ICD-10-CM | POA: Diagnosis present

## 2014-12-08 DIAGNOSIS — I2510 Atherosclerotic heart disease of native coronary artery without angina pectoris: Secondary | ICD-10-CM | POA: Diagnosis present

## 2014-12-08 DIAGNOSIS — E1152 Type 2 diabetes mellitus with diabetic peripheral angiopathy with gangrene: Principal | ICD-10-CM | POA: Diagnosis present

## 2014-12-08 DIAGNOSIS — L97409 Non-pressure chronic ulcer of unspecified heel and midfoot with unspecified severity: Secondary | ICD-10-CM | POA: Diagnosis present

## 2014-12-08 DIAGNOSIS — R011 Cardiac murmur, unspecified: Secondary | ICD-10-CM | POA: Diagnosis present

## 2014-12-08 DIAGNOSIS — Z888 Allergy status to other drugs, medicaments and biological substances status: Secondary | ICD-10-CM

## 2014-12-08 DIAGNOSIS — E1140 Type 2 diabetes mellitus with diabetic neuropathy, unspecified: Secondary | ICD-10-CM | POA: Diagnosis present

## 2014-12-08 DIAGNOSIS — I10 Essential (primary) hypertension: Secondary | ICD-10-CM | POA: Diagnosis present

## 2014-12-08 DIAGNOSIS — Z794 Long term (current) use of insulin: Secondary | ICD-10-CM

## 2014-12-08 DIAGNOSIS — K219 Gastro-esophageal reflux disease without esophagitis: Secondary | ICD-10-CM | POA: Diagnosis present

## 2014-12-08 DIAGNOSIS — Z89442 Acquired absence of left ankle: Secondary | ICD-10-CM

## 2014-12-08 DIAGNOSIS — Z833 Family history of diabetes mellitus: Secondary | ICD-10-CM | POA: Diagnosis not present

## 2014-12-08 DIAGNOSIS — Z87891 Personal history of nicotine dependence: Secondary | ICD-10-CM

## 2014-12-08 DIAGNOSIS — Z89429 Acquired absence of other toe(s), unspecified side: Secondary | ICD-10-CM | POA: Diagnosis not present

## 2014-12-08 DIAGNOSIS — Z89412 Acquired absence of left great toe: Secondary | ICD-10-CM | POA: Diagnosis not present

## 2014-12-08 DIAGNOSIS — E785 Hyperlipidemia, unspecified: Secondary | ICD-10-CM | POA: Diagnosis present

## 2014-12-08 DIAGNOSIS — Z952 Presence of prosthetic heart valve: Secondary | ICD-10-CM | POA: Diagnosis not present

## 2014-12-08 DIAGNOSIS — I739 Peripheral vascular disease, unspecified: Secondary | ICD-10-CM | POA: Diagnosis present

## 2014-12-08 DIAGNOSIS — Z89511 Acquired absence of right leg below knee: Secondary | ICD-10-CM

## 2014-12-08 DIAGNOSIS — Z89512 Acquired absence of left leg below knee: Secondary | ICD-10-CM

## 2014-12-08 HISTORY — PX: AMPUTATION: SHX166

## 2014-12-08 LAB — CBC
HEMATOCRIT: 40.4 % (ref 39.0–52.0)
HEMOGLOBIN: 13.5 g/dL (ref 13.0–17.0)
MCH: 28.2 pg (ref 26.0–34.0)
MCHC: 33.4 g/dL (ref 30.0–36.0)
MCV: 84.5 fL (ref 78.0–100.0)
Platelets: 172 10*3/uL (ref 150–400)
RBC: 4.78 MIL/uL (ref 4.22–5.81)
RDW: 13.4 % (ref 11.5–15.5)
WBC: 10.3 10*3/uL (ref 4.0–10.5)

## 2014-12-08 LAB — PROTIME-INR
INR: 1.11 (ref 0.00–1.49)
PROTHROMBIN TIME: 14.5 s (ref 11.6–15.2)

## 2014-12-08 LAB — COMPREHENSIVE METABOLIC PANEL
ALT: 13 U/L — AB (ref 17–63)
ANION GAP: 8 (ref 5–15)
AST: 20 U/L (ref 15–41)
Albumin: 3.2 g/dL — ABNORMAL LOW (ref 3.5–5.0)
Alkaline Phosphatase: 72 U/L (ref 38–126)
BUN: 11 mg/dL (ref 6–20)
CALCIUM: 9.2 mg/dL (ref 8.9–10.3)
CHLORIDE: 104 mmol/L (ref 101–111)
CO2: 27 mmol/L (ref 22–32)
CREATININE: 1.13 mg/dL (ref 0.61–1.24)
GFR calc Af Amer: >60 mL/min (ref >60)
GFR calc non Af Amer: >60 mL/min (ref >60)
GLUCOSE: 133 mg/dL — AB (ref 65–99)
POTASSIUM: 4.2 mmol/L (ref 3.5–5.1)
Sodium: 139 mmol/L (ref 135–145)
Total Bilirubin: 0.5 mg/dL (ref 0.3–1.2)
Total Protein: 6.9 g/dL (ref 6.5–8.1)

## 2014-12-08 LAB — APTT: aPTT: 29 seconds (ref 24–37)

## 2014-12-08 LAB — GLUCOSE, CAPILLARY
GLUCOSE-CAPILLARY: 100 mg/dL — AB (ref 65–99)
GLUCOSE-CAPILLARY: 168 mg/dL — AB (ref 65–99)
Glucose-Capillary: 122 mg/dL — ABNORMAL HIGH (ref 65–99)
Glucose-Capillary: 95 mg/dL (ref 65–99)
Glucose-Capillary: 189 mg/dL — ABNORMAL HIGH (ref 65–99)

## 2014-12-08 SURGERY — AMPUTATION BELOW KNEE
Anesthesia: General | Site: Leg Lower | Laterality: Left

## 2014-12-08 MED ORDER — CHLORHEXIDINE GLUCONATE 4 % EX LIQD: 60.0000 mL | Freq: Once | CUTANEOUS | Status: DC

## 2014-12-08 MED ORDER — FENTANYL CITRATE (PF) 250 MCG/5ML IJ SOLN
INTRAMUSCULAR | Status: AC
  Filled 2014-12-08: qty 5

## 2014-12-08 MED ORDER — HYDROMORPHONE HCL 1 MG/ML IJ SOLN: 0.2500 mg | INTRAMUSCULAR | Status: DC | PRN

## 2014-12-08 MED ORDER — 0.9 % SODIUM CHLORIDE (POUR BTL) OPTIME
TOPICAL | Status: DC | PRN
  Administered 2014-12-08: 1000 mL

## 2014-12-08 MED ORDER — ACETAMINOPHEN 325 MG PO TABS: 650.0000 mg | ORAL_TABLET | Freq: Four times a day (QID) | ORAL | Status: DC | PRN

## 2014-12-08 MED ORDER — OXYCODONE HCL 5 MG/5ML PO SOLN: 5.0000 mg | Freq: Once | ORAL | Status: AC | PRN

## 2014-12-08 MED ORDER — LACTATED RINGERS IV SOLN
INTRAVENOUS | Status: DC
  Administered 2014-12-08: 14:00:00 via INTRAVENOUS

## 2014-12-08 MED ORDER — METHOCARBAMOL 500 MG PO TABS
500.0000 mg | ORAL_TABLET | Freq: Four times a day (QID) | ORAL | Status: DC | PRN
  Administered 2014-12-08 – 2014-12-09 (×2): 500 mg via ORAL
  Filled 2014-12-08 (×2): qty 1

## 2014-12-08 MED ORDER — INSULIN ASPART 100 UNIT/ML ~~LOC~~ SOLN
0.0000 [IU] | Freq: Three times a day (TID) | SUBCUTANEOUS | Status: DC
  Administered 2014-12-10: 2 [IU] via SUBCUTANEOUS

## 2014-12-08 MED ORDER — ONDANSETRON HCL 4 MG/2ML IJ SOLN: 4.0000 mg | Freq: Four times a day (QID) | INTRAMUSCULAR | Status: DC | PRN

## 2014-12-08 MED ORDER — METOCLOPRAMIDE HCL 5 MG PO TABS: 5.0000 mg | ORAL_TABLET | Freq: Three times a day (TID) | ORAL | Status: DC | PRN

## 2014-12-08 MED ORDER — ONDANSETRON HCL 4 MG PO TABS: 4.0000 mg | ORAL_TABLET | Freq: Four times a day (QID) | ORAL | Status: DC | PRN

## 2014-12-08 MED ORDER — FENTANYL CITRATE (PF) 100 MCG/2ML IJ SOLN
INTRAMUSCULAR | Status: DC | PRN
  Administered 2014-12-08: 50 ug via INTRAVENOUS
  Administered 2014-12-08: 150 ug via INTRAVENOUS
  Administered 2014-12-08: 50 ug via INTRAVENOUS

## 2014-12-08 MED ORDER — PROPOFOL 10 MG/ML IV BOLUS
INTRAVENOUS | Status: AC
  Filled 2014-12-08: qty 20

## 2014-12-08 MED ORDER — POLYETHYLENE GLYCOL 3350 17 G PO PACK
17.0000 g | PACK | Freq: Every day | ORAL | Status: DC | PRN
  Filled 2014-12-08: qty 1

## 2014-12-08 MED ORDER — LATANOPROST 0.005 % OP SOLN
1.0000 [drp] | Freq: Every day | OPHTHALMIC | Status: DC
  Administered 2014-12-08: 1 [drp] via OPHTHALMIC
  Filled 2014-12-08 (×2): qty 2.5

## 2014-12-08 MED ORDER — BISACODYL 5 MG PO TBEC
5.0000 mg | DELAYED_RELEASE_TABLET | Freq: Every day | ORAL | Status: DC | PRN
  Filled 2014-12-08: qty 1

## 2014-12-08 MED ORDER — DOCUSATE SODIUM 100 MG PO CAPS
100.0000 mg | ORAL_CAPSULE | Freq: Two times a day (BID) | ORAL | Status: DC
  Administered 2014-12-08 – 2014-12-10 (×4): 100 mg via ORAL
  Filled 2014-12-08 (×4): qty 1

## 2014-12-08 MED ORDER — ACETAMINOPHEN 650 MG RE SUPP: 650.0000 mg | Freq: Four times a day (QID) | RECTAL | Status: DC | PRN

## 2014-12-08 MED ORDER — PROPOFOL 10 MG/ML IV BOLUS
INTRAVENOUS | Status: DC | PRN
  Administered 2014-12-08: 120 mg via INTRAVENOUS

## 2014-12-08 MED ORDER — INSULIN GLARGINE 100 UNIT/ML ~~LOC~~ SOLN
60.0000 [IU] | Freq: Every day | SUBCUTANEOUS | Status: DC
  Administered 2014-12-08 – 2014-12-09 (×2): 60 [IU] via SUBCUTANEOUS
  Filled 2014-12-08 (×3): qty 0.6

## 2014-12-08 MED ORDER — OXYCODONE HCL 5 MG PO TABS
ORAL_TABLET | ORAL | Status: AC
  Filled 2014-12-08: qty 1

## 2014-12-08 MED ORDER — CEFAZOLIN SODIUM-DEXTROSE 2-3 GM-% IV SOLR
INTRAVENOUS | Status: AC
  Administered 2014-12-08: 2 g via INTRAVENOUS
  Filled 2014-12-08: qty 50

## 2014-12-08 MED ORDER — OXYCODONE HCL 5 MG PO TABS
5.0000 mg | ORAL_TABLET | ORAL | Status: DC | PRN
  Administered 2014-12-08: 10 mg via ORAL
  Administered 2014-12-09: 5 mg via ORAL
  Filled 2014-12-08 (×2): qty 2
  Filled 2014-12-08: qty 1

## 2014-12-08 MED ORDER — MIDAZOLAM HCL 2 MG/2ML IJ SOLN
INTRAMUSCULAR | Status: AC
  Filled 2014-12-08: qty 4

## 2014-12-08 MED ORDER — METOCLOPRAMIDE HCL 5 MG/ML IJ SOLN: 5.0000 mg | Freq: Three times a day (TID) | INTRAMUSCULAR | Status: DC | PRN

## 2014-12-08 MED ORDER — INSULIN ASPART 100 UNIT/ML ~~LOC~~ SOLN
4.0000 [IU] | Freq: Three times a day (TID) | SUBCUTANEOUS | Status: DC
  Administered 2014-12-09 – 2014-12-10 (×4): 4 [IU] via SUBCUTANEOUS

## 2014-12-08 MED ORDER — CALCIUM CARBONATE-VITAMIN D 500-200 MG-UNIT PO TABS
1.0000 | ORAL_TABLET | Freq: Two times a day (BID) | ORAL | Status: DC
  Administered 2014-12-08 – 2014-12-10 (×3): 1 via ORAL
  Filled 2014-12-08 (×3): qty 1

## 2014-12-08 MED ORDER — LIDOCAINE HCL (CARDIAC) 20 MG/ML IV SOLN
INTRAVENOUS | Status: DC | PRN
  Administered 2014-12-08: 60 mg via INTRAVENOUS

## 2014-12-08 MED ORDER — SODIUM CHLORIDE 0.9 % IV SOLN: INTRAVENOUS | Status: DC

## 2014-12-08 MED ORDER — CEFAZOLIN SODIUM 1-5 GM-% IV SOLN
1.0000 g | Freq: Four times a day (QID) | INTRAVENOUS | Status: AC
  Administered 2014-12-08 – 2014-12-09 (×3): 1 g via INTRAVENOUS
  Filled 2014-12-08 (×3): qty 50

## 2014-12-08 MED ORDER — DEXTROSE 5 % IV SOLN
500.0000 mg | Freq: Four times a day (QID) | INTRAVENOUS | Status: DC | PRN
  Filled 2014-12-08: qty 5

## 2014-12-08 MED ORDER — MIDAZOLAM HCL 5 MG/5ML IJ SOLN
INTRAMUSCULAR | Status: DC | PRN
  Administered 2014-12-08: 2 mg via INTRAVENOUS

## 2014-12-08 MED ORDER — OXYCODONE HCL 5 MG PO TABS
5.0000 mg | ORAL_TABLET | Freq: Once | ORAL | Status: AC | PRN
  Administered 2014-12-08: 5 mg via ORAL

## 2014-12-08 MED ORDER — METOPROLOL TARTRATE 50 MG PO TABS
50.0000 mg | ORAL_TABLET | Freq: Two times a day (BID) | ORAL | Status: DC
  Administered 2014-12-08 – 2014-12-10 (×4): 50 mg via ORAL
  Filled 2014-12-08 (×4): qty 1

## 2014-12-08 MED ORDER — LACTATED RINGERS IV SOLN
INTRAVENOUS | Status: DC | PRN
  Administered 2014-12-08 (×2): via INTRAVENOUS

## 2014-12-08 MED ORDER — MEPERIDINE HCL 25 MG/ML IJ SOLN: 6.2500 mg | INTRAMUSCULAR | Status: DC | PRN

## 2014-12-08 MED ORDER — SIMVASTATIN 20 MG PO TABS
20.0000 mg | ORAL_TABLET | Freq: Every day | ORAL | Status: DC
  Administered 2014-12-09: 20 mg via ORAL
  Filled 2014-12-08: qty 1

## 2014-12-08 MED ORDER — CEFAZOLIN SODIUM-DEXTROSE 2-3 GM-% IV SOLR: 2.0000 g | INTRAVENOUS | Status: DC

## 2014-12-08 MED ORDER — HYDROMORPHONE HCL 1 MG/ML IJ SOLN: 1.0000 mg | INTRAMUSCULAR | Status: DC | PRN

## 2014-12-08 MED ORDER — LOSARTAN POTASSIUM 50 MG PO TABS
50.0000 mg | ORAL_TABLET | Freq: Every morning | ORAL | Status: DC
  Administered 2014-12-09 – 2014-12-10 (×2): 50 mg via ORAL
  Filled 2014-12-08 (×2): qty 1

## 2014-12-08 MED ORDER — DAPAGLIFLOZIN PROPANEDIOL 10 MG PO TABS: 10.0000 mg | ORAL_TABLET | Freq: Every day | ORAL | Status: DC

## 2014-12-08 MED ORDER — PANTOPRAZOLE SODIUM 40 MG PO TBEC
40.0000 mg | DELAYED_RELEASE_TABLET | Freq: Every day | ORAL | Status: DC
  Administered 2014-12-08 – 2014-12-10 (×3): 40 mg via ORAL
  Filled 2014-12-08 (×3): qty 1

## 2014-12-08 MED ORDER — ASPIRIN 325 MG PO TABS
325.0000 mg | ORAL_TABLET | Freq: Every day | ORAL | Status: DC
  Administered 2014-12-08 – 2014-12-10 (×3): 325 mg via ORAL
  Filled 2014-12-08 (×3): qty 1

## 2014-12-08 SURGICAL SUPPLY — 36 items
BLADE SAW RECIP 87.9 MT (BLADE) ×3 IMPLANT
BLADE SURG 21 STRL SS (BLADE) ×3 IMPLANT
BNDG COHESIVE 6X5 TAN STRL LF (GAUZE/BANDAGES/DRESSINGS) ×4 IMPLANT
BNDG GAUZE ELAST 4 BULKY (GAUZE/BANDAGES/DRESSINGS) ×4 IMPLANT
COVER SURGICAL LIGHT HANDLE (MISCELLANEOUS) ×6 IMPLANT
CUFF TOURNIQUET SINGLE 34IN LL (TOURNIQUET CUFF) IMPLANT
CUFF TOURNIQUET SINGLE 44IN (TOURNIQUET CUFF) IMPLANT
DRAPE EXTREMITY T 121X128X90 (DRAPE) ×3 IMPLANT
DRAPE PROXIMA HALF (DRAPES) ×6 IMPLANT
DRAPE U-SHAPE 47X51 STRL (DRAPES) ×3 IMPLANT
DRSG ADAPTIC 3X8 NADH LF (GAUZE/BANDAGES/DRESSINGS) ×3 IMPLANT
DRSG PAD ABDOMINAL 8X10 ST (GAUZE/BANDAGES/DRESSINGS) ×3 IMPLANT
DURAPREP 26ML APPLICATOR (WOUND CARE) ×3 IMPLANT
ELECT REM PT RETURN 9FT ADLT (ELECTROSURGICAL) ×3
ELECTRODE REM PT RTRN 9FT ADLT (ELECTROSURGICAL) ×1 IMPLANT
GAUZE SPONGE 4X4 12PLY STRL (GAUZE/BANDAGES/DRESSINGS) ×3 IMPLANT
GLOVE BIOGEL PI IND STRL 9 (GLOVE) ×1 IMPLANT
GLOVE BIOGEL PI INDICATOR 9 (GLOVE) ×2
GLOVE SURG ORTHO 9.0 STRL STRW (GLOVE) ×3 IMPLANT
GOWN STRL REUS W/ TWL XL LVL3 (GOWN DISPOSABLE) ×2 IMPLANT
GOWN STRL REUS W/TWL XL LVL3 (GOWN DISPOSABLE) ×6
KIT BASIN OR (CUSTOM PROCEDURE TRAY) ×3 IMPLANT
KIT ROOM TURNOVER OR (KITS) ×3 IMPLANT
MANIFOLD NEPTUNE II (INSTRUMENTS) ×3 IMPLANT
NS IRRIG 1000ML POUR BTL (IV SOLUTION) ×3 IMPLANT
PACK GENERAL/GYN (CUSTOM PROCEDURE TRAY) ×3 IMPLANT
PAD ARMBOARD 7.5X6 YLW CONV (MISCELLANEOUS) ×6 IMPLANT
SPONGE LAP 18X18 X RAY DECT (DISPOSABLE) IMPLANT
STAPLER VISISTAT 35W (STAPLE) IMPLANT
STOCKINETTE IMPERVIOUS LG (DRAPES) ×3 IMPLANT
SUT SILK 2 0 (SUTURE) ×3
SUT SILK 2-0 18XBRD TIE 12 (SUTURE) ×1 IMPLANT
SUT VIC AB 1 CTX 27 (SUTURE) IMPLANT
TOWEL OR 17X24 6PK STRL BLUE (TOWEL DISPOSABLE) ×3 IMPLANT
TOWEL OR 17X26 10 PK STRL BLUE (TOWEL DISPOSABLE) ×3 IMPLANT
WATER STERILE IRR 1000ML POUR (IV SOLUTION) ×3 IMPLANT

## 2014-12-08 NOTE — Op Note (Signed)
   Date of Surgery: 12/08/2014  INDICATIONS: [name] is a 59 y.o.-year-old male who has had previous foot salvage intervention with a midfoot amputation on the left. 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  PROCEDURE: Transtibial amputation  SURGEON: Sharol Given, M.D.  ANESTHESIA:  general  IV FLUIDS AND URINE: See anesthesia.  ESTIMATED BLOOD LOSS: Minimal mL.  COMPLICATIONS: None.  DESCRIPTION OF PROCEDURE: The patient was brought to the operating room and underwent a general anesthetic. After adequate levels of anesthesia were obtained patient's lower extremity was prepped using DuraPrep draped into a sterile field. A timeout was called.  A transverse incision was made 11 cm distal to the tibial tubercle. This curved proximally and a large posterior flap was created. The tibia was transected 1 cm proximal to the skin incision. The fibula was transected just proximal to the tibial incision. The tibia was beveled anteriorly. A large posterior flap was created. The sciatic nerve was pulled cut and allowed to retract. The vascular bundles were suture ligated with 2-0 silk. The deep and superficial fascial layers were closed using #1 Vicryl. The skin was closed using staples and 2-0 nylon. The wound was covered with Adaptic orthopedic sponges AB dressing Kerlix and Coban. Patient was extubated taken to the PACU in stable condition.  Raymond Score, MD [place] 5:58 PM

## 2014-12-08 NOTE — Transfer of Care (Signed)
Immediate Anesthesia Transfer of Care Note  Patient: [name]  Procedure(s) Performed: Procedure(s): Left Below Knee Amputation (Left)  Patient Location: PACU  Anesthesia Type:General  Level of Consciousness: awake, alert , oriented and patient cooperative  Airway & Oxygen Therapy: Patient Spontanous Breathing and Patient connected to nasal cannula oxygen  Post-op Assessment: Report given to RN, Post -op Vital signs reviewed and stable, Patient moving all extremities and Patient moving all extremities X 4  Post vital signs: Reviewed and stable  Last Vitals:  Filed Vitals:   12/08/14 1745  BP: 149/72  Pulse: 91  Temp:   Resp: 16    Complications: No apparent anesthesia complications

## 2014-12-08 NOTE — Anesthesia Preprocedure Evaluation (Addendum)
Anesthesia Evaluation  Patient identified by MRN, date of birth, ID band Patient awake    Reviewed: Allergy & Precautions, NPO status , Patient's Chart, lab work & pertinent test results  History of Anesthesia Complications Negative for: history of anesthetic complications  Airway Mallampati: II  TM Distance: >3 FB Neck ROM: Full    Dental  (+) Missing, Dental Advisory Given   Pulmonary former smoker,  Quit soking 20 yrs ago breath sounds clear to auscultation  Pulmonary exam normal       Cardiovascular hypertension, Pt. on medications + CAD, + Past MI and + Peripheral Vascular Disease Rhythm:Regular Rate:Normal  MI 2010   Neuro/Psych negative neurological ROS     GI/Hepatic Neg liver ROS, GERD-  Medicated and Controlled,  Endo/Other  negative endocrine ROSdiabetes, Well Controlled, Type 2, Insulin Dependent, Oral Hypoglycemic Agents  Renal/GU negative Renal ROS     Musculoskeletal   Abdominal (+)  Abdomen: soft.    Peds  Hematology negative hematology ROS (+)   Anesthesia Other Findings   Reproductive/Obstetrics                            Anesthesia Physical Anesthesia Plan  ASA: III  Anesthesia Plan: General   Post-op Pain Management:    Induction:   Airway Management Planned: LMA  Additional Equipment:   Intra-op Plan:   Post-operative Plan: Extubation in OR  Informed Consent:   Dental advisory given  Plan Discussed with: CRNA, Anesthesiologist and Surgeon  Anesthesia Plan Comments:        Anesthesia Quick Evaluation

## 2014-12-08 NOTE — H&P (Signed)
[name] is an 59 y.o. male.   Chief Complaint: Abscess osteomyelitis left hindfoot. HPI: Patient is a 59 year old gentleman diabetic insensate neuropathy who is 3 years status post foot salvage with a transmetatarsal amputation. Patient presents at this time with large necrotic ulcer of the hindfoot which extends down to the calcaneus and talus.  Past Medical History  Diagnosis Date  . CAD (coronary artery disease) October 2010    a. 02/2009 NSTEMI/Cath: 3VD; b. 02/2009 CABG x 5: LIMA->LAD, VG->Diag, VG->RI, VG->RVM->RPDA.  Marland Kitchen Hyperlipidemia   . Hypertension   . PVD (peripheral vascular disease)     a. s/p toe amputations on L foot.  . Bicuspid aortic valve     a. 02/2009 s/p tissue AVR;  b. 07/2014 Echo: EF 55-60%, basal-mid inf HK, mild AS/MR, mod dil LA, PASP 44 mmHg.  Marland Kitchen GERD (gastroesophageal reflux disease)   . Diabetes mellitus     Type II  . Heart murmur     Past Surgical History  Procedure Laterality Date  . Amputation      Left great and second toes 2011  . Amputation      Left great and second toes 2011  . Aortic valve replacement      Pericardial tissue valve  . Coronary artery bypass graft  10  . Amputation  07/27/2011    Procedure: AMPUTATION FOOT;  Surgeon: Newt Minion, MD;  Location: [place];  Service: Orthopedics;  Laterality: Left;  Left Midfoot Amputation     Family History  Problem Relation Age of Onset  . Diabetes    . Coronary artery disease    . Heart attack Father     early age  . Heart disease Father   . Stroke Father   . Heart attack Brother     At age 29  . Stroke Brother   . Diabetes type II Mother   . Diabetes type II Sister    Social History:  reports that he quit smoking about 14 years ago. He does not have any smokeless tobacco history on file. He reports that he does not drink alcohol or use illicit drugs.  Allergies:  Allergies  Allergen Reactions  . Ace Inhibitors Cough    No prescriptions prior to admission    No  results found for this or any previous visit (from the past 48 hour(s)). No results found.  Review of Systems  All other systems reviewed and are negative.   There were no vitals taken for this visit. Physical Exam  On examination there is a large medial necrotic ulcer on the right hindfoot which extends down to the calcaneus and talus. Radiographs do show some destructive bony changes. Assessment/Plan Assessment: Abscess osteomyelitis with necrotic left hindfoot 3 years status post midfoot amputation.  Plan: We will proceed with a transtibial amputation. Risks and benefits were discussed including risk of the wound healing. Patient states he understands and wishes to proceed at this time.  DUDA,MARCUS V 12/08/2014, 6:30 AM

## 2014-12-08 NOTE — Anesthesia Procedure Notes (Signed)
Procedure Name: LMA Insertion Date/Time: 12/08/2014 5:20 PM Performed by: Izora Gala Pre-anesthesia Checklist: Patient identified, Emergency Drugs available, Suction available, Patient being monitored and Timeout performed Patient Re-evaluated:Patient Re-evaluated prior to inductionOxygen Delivery Method: Circle system utilized Preoxygenation: Pre-oxygenation with 100% oxygen Intubation Type: IV induction LMA: LMA inserted LMA Size: 4.0 Number of attempts: 1 Placement Confirmation: positive ETCO2 Tube secured with: Tape Dental Injury: Teeth and Oropharynx as per pre-operative assessment

## 2014-12-09 ENCOUNTER — Encounter (HOSPITAL_COMMUNITY): Payer: Self-pay | Admitting: Orthopedic Surgery

## 2014-12-09 LAB — GLUCOSE, CAPILLARY
GLUCOSE-CAPILLARY: 93 mg/dL (ref 65–99)
GLUCOSE-CAPILLARY: 84 mg/dL (ref 65–99)
GLUCOSE-CAPILLARY: 100 mg/dL — AB (ref 65–99)
Glucose-Capillary: 115 mg/dL — ABNORMAL HIGH (ref 65–99)

## 2014-12-09 MED ORDER — OXYCODONE-ACETAMINOPHEN 5-325 MG PO TABS
1.0000 | ORAL_TABLET | ORAL | Status: DC | PRN
Start: 2014-12-09 — End: 2018-01-15

## 2014-12-09 MED ORDER — PNEUMOCOCCAL VAC POLYVALENT 25 MCG/0.5ML IJ INJ: 0.5000 mL | INJECTION | INTRAMUSCULAR | Status: DC

## 2014-12-09 NOTE — Progress Notes (Signed)
Patient ID: [name], male   DOB: 11/24/1955, 59 y.o.   MRN: [id number] Postoperative day 1 transtibial amputation. Patient is comfortable this morning. Patient states that his last surgery he was not approved for skilled nursing and he is reluctant to pay for home health physical therapy. Anticipate discharge to home tomorrow.

## 2014-12-09 NOTE — Evaluation (Signed)
Physical Therapy Evaluation Patient Details Name: [name] MRN: [id number] DOB: 12/26/1955 Today's Date: 12/09/2014   History of Present Illness  59 y/o male presenting s/p L BKA. H/o R transmet amputation.  Clinical Impression  Pt admitted with above diagnosis. Pt currently with functional limitations due to the deficits listed below (see PT Problem List). Pt currently at min guard level of assist for short distance gait and transfers using RW. Pt will benefit from skilled PT to increase their independence and safety with mobility to allow discharge to the venue listed below. Discussed with pt my recommendation for some intermittent assistance at home for home management as he lives alone, and pt thinks this may be possible. If not, would recommend short term rehab at SNF to increase overall endurance, mobility, and functional independence prior to d/c home.     Follow Up Recommendations Home health PT;Supervision - Intermittent (If unable to to get any assist at home, recommend ST SNF)    Equipment Recommendations  Wheelchair (measurements PT);Wheelchair cushion (measurements PT) (18x18 w/c with L amputee support pad and basic cushion)    Recommendations for Other Services OT consult     Precautions / Restrictions Precautions Precautions: Fall Restrictions Weight Bearing Restrictions: Yes LLE Weight Bearing: Non weight bearing      Mobility  Bed Mobility Overal bed mobility: Modified Independent             General bed mobility comments: Mod I using rails for supine to sit  Transfers Overall transfer level: Needs assistance Equipment used: Rolling walker (2 wheeled) Transfers: Sit to/from [facility] Sit to Stand: Min guard Stand pivot transfers: Min guard       General transfer comment: cues for hand placement and technique; steady A for balance initially during sit to stand  Ambulation/Gait Ambulation/Gait assistance: Min  guard Ambulation Distance (Feet): 20 Feet Assistive device: Rolling walker (2 wheeled) Gait Pattern/deviations:  (hop to pattern)     General Gait Details: Cues for placement of RW for efficiency to push through UE's.  pt demonstrates good safety awareness and slow speed for safety.  Stairs            Wheelchair Mobility    Modified Rankin (Stroke Patients Only)       Balance                                             Pertinent Vitals/Pain Pain Assessment: No/denies pain    Home Living Family/patient expects to be discharged to:: Private residence Living Arrangements: Alone Available Help at Discharge: Neighbor;Friend(s);Available PRN/intermittently Type of Home: House Home Access: Ramped entrance     Home Layout: One level Home Equipment: Walker - 2 wheels;Crutches      Prior Function Level of Independence: Independent               Hand Dominance   Dominant Hand: Right    Extremity/Trunk Assessment   Upper Extremity Assessment: Defer to OT evaluation           Lower Extremity Assessment: LLE deficits/detail RLE Deficits / Details: h/o R transmet amputation; Pt reports sometimes he wears shoe with filler LLE Deficits / Details: L BKA (new); Limited flexion due to surgical wrap; Knee extension WFL; grossly 3/5  Cervical / Trunk Assessment: Normal  Communication   Communication: No difficulties  Cognition Arousal/Alertness: Awake/alert Behavior During  Therapy: WFL for tasks assessed/performed Overall Cognitive Status: Within Functional Limits for tasks assessed                      General Comments General comments (skin integrity, edema, etc.): Educated on importance of ROM and positioning of residual limb in preparation for prosthesis in future. Also educated on phantom pain/sensation and desensitization.    Exercises Amputee Exercises Quad Sets: Strengthening;Left;10 reps Gluteal Sets: Strengthening;Both;10  reps Towel Squeeze: Strengthening;Both;5 reps Hip ABduction/ADduction: Strengthening;AROM;Left;5 reps Knee Flexion: Strengthening;AROM;Left;10 reps      Assessment/Plan    PT Assessment Patient needs continued PT services  PT Diagnosis Difficulty walking;Generalized weakness;Acute pain   PT Problem List Decreased strength;Decreased range of motion;Decreased activity tolerance;Decreased balance;Decreased mobility;Decreased knowledge of use of DME;Pain;Decreased skin integrity  PT Treatment Interventions DME instruction;Gait training;Functional mobility training;Therapeutic activities;Therapeutic exercise;Balance training;Neuromuscular re-education;Patient/family education;Wheelchair mobility training;Modalities   PT Goals (Current goals can be found in the Care Plan section) Acute Rehab PT Goals Patient Stated Goal: get better PT Goal Formulation: With patient Time For Goal Achievement: 12/16/14 Potential to Achieve Goals: Good    Frequency Min 5X/week   Barriers to discharge Decreased caregiver support Pt lives alone. Stressed to pt having someone stay with him (at least some of the time) to assist with household management and set-up (kitchen tasks, cleaning, grocery shopping, etc). Pt states he would not be opposed to short SNF stay but thinks he could get some intermittent help at home.    Co-evaluation               End of Session Equipment Utilized During Treatment: Gait belt Activity Tolerance: Patient tolerated treatment well Patient left: in chair;with call bell/phone within reach;with nursing/sitter in room Nurse Communication: Mobility status         Time: 0925-0950 PT Time Calculation (min) (ACUTE ONLY): 25 min   Charges:   PT Evaluation $Initial PT Evaluation Tier I: 1 Procedure PT Treatments $Therapeutic Exercise: 8-22 mins   PT G Codes:        Canary Brim Ivory Broad, PT, DPT Pager #: [phone]  12/09/2014, 10:09 AM

## 2014-12-09 NOTE — Care Management Note (Signed)
Case Management Note  Patient Details  Name: [name] MRN: [id number] Date of Birth: Sep 16, 1955  Subjective/Objective:           S/p Left BKA         Action/Plan: Spoke with patient about discharge plan, patient stated that he will have assistance at home, may not be 24/7 but he will have friends to help him after discharge. He selected [facility] from [facility] list. Alfonzo Beers at [facility] and set up [facility]. [employer] delivered wheelchair with amputee stump guard support to patient's room.    Expected Discharge Date:                  Expected Discharge Plan:  [place]  In-House Referral:  NA  Discharge planning Services  CM Consult  Post Acute Care Choice:  Durable Medical Equipment, Home Health Choice offered to:     DME Arranged:  Wheelchair manual DME Agency:  [employer]  HH Arranged:  PT HH Agency:  [facility]  Status of Service:  Completed, signed off  Medicare Important Message Given:    Date Medicare IM Given:    Medicare IM give by:    Date Additional Medicare IM Given:    Additional Medicare Important Message give by:     If discussed at [place] of Stay Meetings, dates discussed:    Additional Comments:  Nila Nephew, RN 12/09/2014, 4:02 PM

## 2014-12-10 LAB — GLUCOSE, CAPILLARY
Glucose-Capillary: 148 mg/dL — ABNORMAL HIGH (ref 65–99)
Glucose-Capillary: 134 mg/dL — ABNORMAL HIGH (ref 65–99)
Glucose-Capillary: 136 mg/dL — ABNORMAL HIGH (ref 65–99)

## 2014-12-10 NOTE — Anesthesia Postprocedure Evaluation (Signed)
  Anesthesia Post-op Note  Patient: [name]  Procedure(s) Performed: Procedure(s): Left Below Knee Amputation (Left)  Patient Location: PACU  Anesthesia Type: General   Level of Consciousness: awake, alert  and oriented  Airway and Oxygen Therapy: Patient Spontanous Breathing  Post-op Pain: none  Post-op Assessment: Post-op Vital signs reviewed  Post-op Vital Signs: Reviewed  Last Vitals:  Filed Vitals:   12/10/14 0446  BP: 155/73  Pulse: 96  Temp: 37 C  Resp: 18    Complications: No apparent anesthesia complications

## 2014-12-10 NOTE — Progress Notes (Signed)
Physical Therapy Treatment Patient Details Name: [name] MRN: [id number] DOB: 11/06/1955 Today's Date: Jan 09, 2015    History of Present Illness 59 y/o male presenting s/p L BKA    PT Comments    Pt progressing well with rehab. Ambulation is slow but pt is safe and stable. Pt wanted to go out in [place] and was able to ambulate a few steps in [place]. Pt reports no pain. Pt states that he will have intermittent help at home. Pt plans to D/C home this afternoon. Continue to recommend HHPT to increase functional independence and safety.   Follow Up Recommendations  Home health PT;Supervision - Intermittent     Equipment Recommendations  Wheelchair (measurements PT);Wheelchair cushion (measurements PT)    Recommendations for Other Services       Precautions / Restrictions Precautions Precautions: Fall Restrictions LLE Weight Bearing: Non weight bearing    Mobility  Bed Mobility Overal bed mobility: Modified Independent                Transfers Overall transfer level: Needs assistance Equipment used: Rolling walker (2 wheeled) Transfers: Sit to/from Stand Sit to Stand: Supervision         General transfer comment: Superrvision for safety. Pt overall stable and safe with transfers.  Ambulation/Gait Ambulation/Gait assistance: Supervision Ambulation Distance (Feet): 40 Feet Assistive device: Rolling walker (2 wheeled)     Gait velocity interpretation: Below normal speed for age/gender General Gait Details: Supervision for safety. Pt safe and stable with overall gait.   Stairs            Wheelchair Mobility    Modified Rankin (Stroke Patients Only)       Balance                                    Cognition Arousal/Alertness: Awake/alert Behavior During Therapy: WFL for tasks assessed/performed Overall Cognitive Status: Within Functional Limits for tasks assessed                      Exercises General Exercises -  Lower Extremity Quad Sets: Strengthening;Left;10 reps;Seated Straight Leg Raises: AROM;Left;10 reps;Seated    General Comments        Pertinent Vitals/Pain Pain Assessment: No/denies pain    Home Living                      Prior Function            PT Goals (current goals can now be found in the care plan section) Progress towards PT goals: Progressing toward goals    Frequency  Min 5X/week    PT Plan Current plan remains appropriate    Co-evaluation             End of Session Equipment Utilized During Treatment: Gait belt Activity Tolerance: Patient tolerated treatment well Patient left: in chair;with call bell/phone within reach     Time: 0942-1002 PT Time Calculation (min) (ACUTE ONLY): 20 min  Charges:                       G CodesAllyn Kenner, SPTA 2015/01/09, 10:14 AM

## 2014-12-13 NOTE — Discharge Summary (Signed)
Physician Discharge Summary  Patient ID: [name] MRN: [id number] DOB/AGE: [birth year] 58 y.o.  Admit date: 12/08/2014 Discharge date: 12/13/2014  Admission Diagnoses: Osteomyelitis ulceration left lower extremity  Discharge Diagnoses:  Active Problems:   Below knee amputation status   Discharged Condition: stable  Hospital Course: Patient underwent a transtibial amputation. Postoperatively he progressed well and was discharged to home in stable condition.  Consults: None  Significant Diagnostic Studies: labs: Routine labs  Treatments: surgery: See operative note  Discharge Exam: Blood pressure 155/73, pulse 96, temperature 98.6 F (37 C), temperature source Oral, resp. rate 18, height 5\' 10"  (1.778 m), weight 92.987 kg (205 lb), SpO2 98 %. Incision/Wound: dressing clean and dry  Disposition: 01-Home or Self Care  Discharge Instructions    Call MD / Call 911    Complete by:  As directed   If you experience chest pain or shortness of breath, CALL 911 and be transported to the hospital emergency room.  If you develope a fever above 101 F, pus (white drainage) or increased drainage or redness at the wound, or calf pain, call your surgeon's office.     Call MD / Call 911    Complete by:  As directed   If you experience chest pain or shortness of breath, CALL 911 and be transported to the hospital emergency room.  If you develope a fever above 101.5 F, pus (white drainage) or increased drainage or redness at the wound, or calf pain, call your surgeon's office.     Constipation Prevention    Complete by:  As directed   Drink plenty of fluids.  Prune juice may be helpful.  You may use a stool softener, such as Colace (over the counter) 100 mg twice a day.  Use MiraLax (over the counter) for constipation as needed.     Constipation Prevention    Complete by:  As directed   Drink plenty of fluids.  Prune juice may be helpful.  You may use a stool softener, such as Colace (over  the counter) 100 mg twice a day.  Use MiraLax (over the counter) for constipation as needed.     Diet - low sodium heart healthy    Complete by:  As directed      Diet - low sodium heart healthy    Complete by:  As directed      Diet general    Complete by:  As directed      Driving restrictions    Complete by:  As directed   No driving while taking narcotic pain meds.     Increase activity slowly as tolerated    Complete by:  As directed      Increase activity slowly as tolerated    Complete by:  As directed             Medication List    TAKE these medications        aspirin 325 MG tablet  Take 325 mg by mouth daily.     calcium-vitamin D 500-200 MG-UNIT per tablet  Commonly known as:  OSCAL WITH D  Take 1 tablet by mouth 2 (two) times daily.     cetirizine 10 MG tablet  Commonly known as:  ZYRTEC  Take 10 mg by mouth daily.     FARXIGA 10 MG Tabs tablet  Generic drug:  dapagliflozin propanediol  Take 10 mg by mouth daily.     latanoprost 0.005 % ophthalmic solution  Commonly known as:  XALATAN  Place 1 drop into both eyes at bedtime.     losartan 50 MG tablet  Commonly known as:  COZAAR  Take 50 mg by mouth every morning.     metoprolol 50 MG tablet  Commonly known as:  LOPRESSOR  Take 50-100 mg by mouth 2 (two) times daily. Takes 100mg  in amand 50mg  in pm     multivitamin tablet  Take 1 tablet by mouth daily.     naproxen sodium 220 MG tablet  Commonly known as:  ANAPROX  Take 220 mg by mouth 2 (two) times daily as needed (pain).     ondansetron 4 MG tablet  Commonly known as:  ZOFRAN  Take 4 mg by mouth every 8 (eight) hours as needed for nausea or vomiting.     oxyCODONE-acetaminophen 5-325 MG per tablet  Commonly known as:  PERCOCET/ROXICET  Take 1 tablet by mouth every 4 (four) hours as needed for severe pain.     oxyCODONE-acetaminophen 5-325 MG per tablet  Commonly known as:  ROXICET  Take 1 tablet by mouth every 4 (four) hours as needed  for severe pain.     pantoprazole 40 MG tablet  Commonly known as:  PROTONIX  Take 40 mg by mouth daily.     silver sulfADIAZINE 1 % cream  Commonly known as:  SILVADENE  Apply 1 application topically as needed. For wound care     simvastatin 20 MG tablet  Commonly known as:  ZOCOR  Take 20 mg by mouth daily at 6 PM.     TANZEUM 50 MG Pen  Generic drug:  Albiglutide  Inject 50 mg into the skin every Monday.     TOUJEO SOLOSTAR 300 UNIT/ML Sopn  Generic drug:  Insulin Glargine  Inject 60 Units into the skin at bedtime.           Follow-up Information    Follow up with Raymond Errico V, MD In 2 weeks.   Specialty:  Orthopedic Surgery   Contact information:   [place] [address] [phone]       Follow up with [facility].   Why:  They will contact you to schedule home therapy visits.   Contact information:   [place] [address] [address] [phone]       Signed: [name] 12/13/2014, 7:29 PM

## 2014-12-13 NOTE — Anesthesia Postprocedure Evaluation (Signed)
  Anesthesia Post-op Note  Patient: [name]  Procedure(s) Performed: Procedure(s): Left Below Knee Amputation (Left)  Patient Location: PACU  Anesthesia Type: General   Level of Consciousness: awake, alert  and oriented  Airway and Oxygen Therapy: Patient Spontanous Breathing  Post-op Pain: mild  Post-op Assessment: Post-op Vital signs reviewed  Post-op Vital Signs: Reviewed  Last Vitals:  Filed Vitals:   12/10/14 0446  BP: 155/73  Pulse: 96  Temp: 37 C  Resp: 18    Complications: No apparent anesthesia complications

## 2014-12-13 NOTE — Addendum Note (Signed)
Addendum  created 12/13/14 1816 by Lorrene Reid, MD   Modules edited: Anesthesia Attestations, Notes Section   Notes Section:  File: [id number]

## 2015-03-16 ENCOUNTER — Encounter: Payer: Self-pay | Admitting: Physical Therapy

## 2015-03-16 ENCOUNTER — Ambulatory Visit: Payer: BLUE CROSS/BLUE SHIELD | Attending: Orthopedic Surgery | Admitting: Physical Therapy

## 2015-03-16 DIAGNOSIS — Z89512 Acquired absence of left leg below knee: Secondary | ICD-10-CM

## 2015-03-16 DIAGNOSIS — R29818 Other symptoms and signs involving the nervous system: Secondary | ICD-10-CM | POA: Diagnosis present

## 2015-03-16 DIAGNOSIS — R2689 Other abnormalities of gait and mobility: Secondary | ICD-10-CM

## 2015-03-16 DIAGNOSIS — Z5189 Encounter for other specified aftercare: Secondary | ICD-10-CM | POA: Diagnosis present

## 2015-03-16 DIAGNOSIS — M21371 Foot drop, right foot: Secondary | ICD-10-CM

## 2015-03-16 DIAGNOSIS — R2681 Unsteadiness on feet: Secondary | ICD-10-CM

## 2015-03-16 DIAGNOSIS — R531 Weakness: Secondary | ICD-10-CM

## 2015-03-16 DIAGNOSIS — Z7409 Other reduced mobility: Secondary | ICD-10-CM

## 2015-03-16 DIAGNOSIS — R269 Unspecified abnormalities of gait and mobility: Secondary | ICD-10-CM | POA: Diagnosis present

## 2015-03-16 DIAGNOSIS — M623 Immobility syndrome (paraplegic): Secondary | ICD-10-CM | POA: Insufficient documentation

## 2015-03-16 DIAGNOSIS — R6889 Other general symptoms and signs: Secondary | ICD-10-CM | POA: Diagnosis present

## 2015-03-16 DIAGNOSIS — Z4789 Encounter for other orthopedic aftercare: Secondary | ICD-10-CM

## 2015-03-16 DIAGNOSIS — M2560 Stiffness of unspecified joint, not elsewhere classified: Secondary | ICD-10-CM

## 2015-03-17 NOTE — Therapy (Signed)
[facility] [address] [place], [place], [address] Phone: [phone]   Fax:  [phone]  Physical Therapy Evaluation  Patient Details  Name: [name] MRN: [id number] Date of Birth: 30-Jul-1955 Referring Provider: Meridee Score, MD  Encounter Date: 03/16/2015      PT End of Session - 03/16/15 1153    Visit Number 1   Number of Visits 21   Date for PT Re-Evaluation 05/27/15   PT Start Time 1100   PT Stop Time 1153   PT Time Calculation (min) 53 min   Equipment Utilized During Treatment Gait belt   Activity Tolerance Patient tolerated treatment well   Behavior During Therapy [facility] for tasks assessed/performed      Past Medical History  Diagnosis Date  . CAD (coronary artery disease) October 2010    a. 02/2009 NSTEMI/Cath: 3VD; b. 02/2009 CABG x 5: LIMA->LAD, VG->Diag, VG->RI, VG->RVM->RPDA.  Marland Kitchen Hyperlipidemia   . Hypertension   . PVD (peripheral vascular disease) ([place])     a. s/p toe amputations on L foot.  . Bicuspid aortic valve     a. 02/2009 s/p tissue AVR;  b. 07/2014 Echo: EF 55-60%, basal-mid inf HK, mild AS/MR, mod dil LA, PASP 44 mmHg.  Marland Kitchen GERD (gastroesophageal reflux disease)   . Diabetes mellitus     Type II  . Heart murmur     Past Surgical History  Procedure Laterality Date  . Amputation      Left great and second toes 2011  . Amputation      Left great and second toes 2011  . Aortic valve replacement      Pericardial tissue valve  . Coronary artery bypass graft  10  . Amputation  07/27/2011    Procedure: AMPUTATION FOOT;  Surgeon: Newt Minion, MD;  Location: [facility];  Service: Orthopedics;  Laterality: Left;  Left Midfoot Amputation   . Amputation Left 12/08/2014    Procedure: Left Below Knee Amputation;  Surgeon: Newt Minion, MD;  Location: [facility];  Service: Orthopedics;  Laterality: Left;    There were no vitals filed for this visit.  Visit Diagnosis:  Abnormality of  gait  Unsteadiness  Balance problems  Foot drop, right  Weakness generalized  Stiffness due to immobility  Decreased functional activity tolerance  Status post below knee amputation of left lower extremity ([place])  Encounter for prosthetic gait training      Subjective Assessment - 03/16/15 1113    Subjective This 59yo male had partial foot amputation of left foot initially in 2011 with midfoot amputation April 2013. He developed osteomyelitis with Left Transtibial Amputation on 12/08/2014. He recieved prosthesis 02/25/2015 and is dependent in use / care. Patient  presents for PT evaluation.    Patient Stated Goals to use prosthesis walk without cane in community.    Currently in Pain? No/denies            [facility] PT Assessment - 03/16/15 1100    Assessment   Medical Diagnosis Left Transtibial Amputation   Referring Provider Meridee Score, MD   Onset Date/Surgical Date 02/25/15  Prosthesis delivery   Precautions   Precautions Fall   Restrictions   Weight Bearing Restrictions No   Balance Screen   Has the patient fallen in the past 6 months Yes   How many times? 2  first was March with hip fx, knee walker tipped   Has the patient had a decrease in activity level because of  a fear of falling?  No   Is the patient reluctant to leave their home because of a fear of falling?  No   Home Environment   Living Environment Private residence   Living Arrangements Alone   Type of [place] to enter;Ramped entrance   Entrance Stairs-Number of Steps 4   Entrance Stairs-Rails Right;Left;Cannot reach both   [place] One level   [place] - single point;Walker - 2 wheels;Wheelchair - manual;Shower seat   Prior Function   Level of Independence Independent;Independent with household mobility without device;Independent with community mobility without device  no limits on distance in community   Vocation Retired   Observation/Other Assessments    Focus on Therapeutic Outcomes (FOTO)  45 Functional Status   Fear Avoidance Belief Questionnaire (FABQ)  26 (8)   Posture/Postural Control   Posture/Postural Control Postural limitations   Postural Limitations Rounded Shoulders;Forward head;Flexed trunk;Weight shift right   ROM / Strength   AROM / PROM / Strength AROM;Strength   AROM   Overall AROM  Within functional limits for tasks performed   Strength   Overall Strength Deficits   Strength Assessment Site Hip;Knee;Ankle   Right/Left Hip Right;Left   Right Hip Flexion 5/5   Right Hip Extension 4/5   Right Hip ABduction 4/5   Left Hip Flexion 5/5   Left Hip Extension 4/5   Left Hip ABduction 4/5   Right/Left Knee Right;Left   Right Knee Flexion 4/5   Right Knee Extension 4/5   Left Knee Flexion 4/5   Left Knee Extension 4/5   Right/Left Ankle Right   Right Ankle Dorsiflexion 3-/5   Right Ankle Plantar Flexion 2+/5   Transfers   Transfers Sit to Stand;Stand to Sit   Sit to Stand 5: Supervision;With upper extremity assist;With armrests;From chair/3-in-1  uses back of legs against chair to stabilize   Stand to Sit 5: Supervision;With upper extremity assist;With armrests;To chair/3-in-1  uses back of legs against chair to control   Ambulation/Gait   Ambulation/Gait Yes   Ambulation/Gait Assistance 4: Min assist   Ambulation Distance (Feet) 150 Feet   Assistive device Prostheses;Straight cane   Gait Pattern Step-through pattern;Decreased arm swing - left;Decreased step length - right;Decreased stance time - left;Decreased stride length;Decreased hip/knee flexion - left;Decreased dorsiflexion - right;Trunk flexed;Abducted - left;Poor foot clearance - right   Ambulation Surface Indoor;Level   Gait velocity 1.95 ft/sec with minA for safety   Standardized Balance Assessment   Standardized Balance Assessment Berg Balance Test;Timed Up and Go Test   Berg Balance Test   Sit to Stand Needs minimal aid to stand or to stabilize    Standing Unsupported Needs several tries to stand 30 seconds unsupported   Sitting with Back Unsupported but Feet Supported on Floor or Stool Able to sit safely and securely 2 minutes   Stand to Sit Uses backs of legs against chair to control descent   Transfers Able to transfer safely, definite need of hands   Standing Unsupported with Eyes Closed Needs help to keep from falling   Standing Ubsupported with Feet Together Needs help to attain position and unable to hold for 15 seconds   From Standing, Reach Forward with Outstretched Arm Loses balance while trying/requires external support   From Standing Position, Pick up Object from Floor Unable to try/needs assist to keep balance   From Standing Position, Turn to Look Behind Over each Shoulder Needs assist to keep from losing balance  and falling   Turn 360 Degrees Needs assistance while turning   Standing Unsupported, Alternately Place Feet on Step/Stool Needs assistance to keep from falling or unable to try   Standing Unsupported, One Foot in [employer] balance while stepping or standing   Standing on One Leg Unable to try or needs assist to prevent fall   Total Score 11   Timed Up and Go Test   Normal TUG (seconds) 25.32  25.32 sec with cane, 23.55 sec no device with minA         Prosthetics Assessment - 03/16/15 1100    Prosthetics   Prosthetic Care Dependent with Skin check;Residual limb care;Prosthetic cleaning;Ply sock cleaning;Correct ply sock adjustment;Proper wear schedule/adjustment;Proper weight-bearing schedule/adjustment   Donning prosthesis  Supervision   Doffing prosthesis  Modified independent (Device/Increase time)   Current prosthetic wear tolerance (days/week)  daily since delivery   Current prosthetic wear tolerance (#hours/day)  reports he progressed up to 6hrs yesterday   Current prosthetic weight-bearing tolerance (hours/day)  Patient tolerated 7 min of standing / gait with no complaints of pain or discomfort    Edema none   Residual limb condition  left residual limb has 52mm scab at distal tibia. Pt reports he almost fell using kneel walker and hit limb on knob. Over moist from excessive sweating.                   [facility] Adult PT Treatment/Exercise - 03/16/15 1100    Prosthetics   Education Provided Skin check;Residual limb care;Prosthetic cleaning;Ply sock cleaning;Correct ply sock adjustment;Proper Donning;Proper Doffing;Proper wear schedule/adjustment;Proper weight-bearing schedule/adjustment;Other (comment)  use of Tegaderm, wear prosthesis 4hrs 2x/day   Person(s) Educated Patient   Education Method Explanation;Demonstration;Tactile cues;Verbal cues   Education Method Verbalized understanding;Returned demonstration;Tactile cues required;Verbal cues required;Needs further instruction                  PT Short Term Goals - 03/16/15 1400    PT SHORT TERM GOAL #1   Title patient tolerates wear of prosthesis >10 hrs total per day with no new skin issues, current wound healing and no tenderness. (Target Date: 04/15/2015)   Time 1   Period Months   Status New   PT SHORT TERM GOAL #2   Title Patient verbalizes & demonstrates proper donning, cleaning and skin checking.  (Target Date: 04/15/2015)   Time 1   Period Months   Status New   PT SHORT TERM GOAL #3   Title Berg Balance >/= 20 /56.  (Target Date: 04/15/2015)   Time 1   Period Months   Status New   PT SHORT TERM GOAL #4   Title Patient ambulates 200' with single point cane & prosthesis with supervision. (Target Date: 04/15/2015)   Time 1   Period Months   Status New   PT SHORT TERM GOAL #5   Title Patient negotiates ramps, curbs & stairs (1 rail) with single point cane & prosthesis with minA. (Target Date: 04/15/2015)   Time 1   Period Months   Status New           PT Long Term Goals - 03/16/15 1500    PT LONG TERM GOAL #1   Title Pateint verbalizes and demonstrates proper prosthetic care to enable safe use of  prosthesis. (Target Date: 05/27/2015)   Time 10   Period Weeks   Status New   PT LONG TERM GOAL #2   Title Patient tolerates daily wear of prosthesis >90% of  awake hours without skin issues or limb pain to enable potential for function throughout his day. (Target Date: 05/27/2015)   Time 10   Period Weeks   Status New   PT LONG TERM GOAL #3   Title Patient ambulates 100' around furniture with prosthesis only carrying household item modified independent. (Target Date: 05/27/2015)   Time 10   Period Weeks   Status New   PT LONG TERM GOAL #4   Title Patient ambulates 300' with single point cane and prosthesis on outdoor surfaces modified independent. (Target Date: 05/27/2015)   Time 10   Period Weeks   Status New   PT LONG TERM GOAL #5   Title Patient negotiates ramps, curbs and stairs (1 rail) with single point cane and prosthesis modified independent. (Target Date: 05/27/2015)   Time 10   Period Weeks   Status New   Additional Long Term Goals   Additional Long Term Goals Yes   PT LONG TERM GOAL #6   Title Berg Balance >/= 22/56 to decrease risk of falls. (Target Date: 05/27/2015)   Time 10   Period Weeks   Status New               Plan - 03/16/15 1153    Clinical Impression Statement This 59yo male just recieved his first prosthesis 02/25/2015 and is dependent in care which increases risk of skin issues with his limb. He has progressed wear too rapidly to 6 hrs with skin changes risking breakdown and he injured his limb with a wound. He needs to progress wear to most of awake hours with PT guidance. He has a right partial foot amputation with wound. He may benefit from a right AFO with foot orthosis / partial foot prosthesis. He has high fall risk noted by Merrilee Jansky Balance 11/56 and Timed Up -Go 23.55sec. His gait is unsafe with prosthesis.    Pt will benefit from skilled therapeutic intervention in order to improve on the following deficits Abnormal gait;Decreased activity  tolerance;Decreased balance;Decreased endurance;Decreased mobility;Decreased strength;Prosthetic Dependency;Postural dysfunction   Rehab Potential Good   PT Frequency 2x / week   PT Duration Other (comment)  10 weeks   PT Treatment/Interventions ADLs/Self Care Home Management;Functional mobility training;Stair training;Gait training;Therapeutic activities;Therapeutic exercise;DME Instruction;Balance training;Neuromuscular re-education;Patient/family education;Prosthetic Training;Orthotic Fit/Training   PT Next Visit Plan Assess balance and gait with [place] Reaction AFO on RLE; HEP for midline at sink; gait with single point cane   Recommended Other Services Patient may need right AFO   Consulted and Agree with Plan of Care Patient         Problem List Patient Active Problem List   Diagnosis Date Noted  . Below knee amputation status ([place]) 12/08/2014  . Bicuspid aortic valve   . Hyperlipidemia   . Hypertension   . Greater trochanter fracture ([place]) 07/28/2014  . Hip fracture, right ([place]) 07/28/2014  . Diabetes mellitus without complication ([place]) 123XX123  . HLD (hyperlipidemia) 07/28/2014  . GERD (gastroesophageal reflux disease) 07/28/2014  . Hip fracture ([place]) 07/28/2014  . Fall 07/28/2014  . Leukocytosis 07/28/2014  . Thrombocytopenia ([place]) 07/28/2014  . H/O aortic valve replacement 10/16/2011  . 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 PLEURAL EFFUSION 05/25/2009  . OPEN WOUND OF CHEST , COMPLICATED 123XX123  . PROSTHETIC VALVE - BIO/ ENDOPROSTHESIS 05/25/2009  . DM 04/19/2009  . HYPERLIPIDEMIA 04/19/2009  . Essential hypertension 04/19/2009  . CAD (coronary artery disease) 04/19/2009  . Peripheral vascular disease ([place]) 04/19/2009  . OSTEOMYELITIS 04/19/2009  .  BICUSPID AORTIC VALVE 04/19/2009    Jamey Reas PT, DPT 03/17/2015, 5:35 PM  [facility] [address] [place], [place],  [address] Phone: [phone]   Fax:  [phone]  Name: [name] MRN: [id number] Date of Birth: 1956/01/05

## 2015-03-21 ENCOUNTER — Ambulatory Visit: Payer: BLUE CROSS/BLUE SHIELD | Admitting: Physical Therapy

## 2015-03-21 ENCOUNTER — Encounter: Payer: Self-pay | Admitting: Physical Therapy

## 2015-03-21 DIAGNOSIS — R6889 Other general symptoms and signs: Secondary | ICD-10-CM

## 2015-03-21 DIAGNOSIS — Z7409 Other reduced mobility: Secondary | ICD-10-CM

## 2015-03-21 DIAGNOSIS — R2681 Unsteadiness on feet: Secondary | ICD-10-CM

## 2015-03-21 DIAGNOSIS — Z4789 Encounter for other orthopedic aftercare: Secondary | ICD-10-CM

## 2015-03-21 DIAGNOSIS — R269 Unspecified abnormalities of gait and mobility: Secondary | ICD-10-CM

## 2015-03-21 DIAGNOSIS — M21371 Foot drop, right foot: Secondary | ICD-10-CM

## 2015-03-21 DIAGNOSIS — R2689 Other abnormalities of gait and mobility: Secondary | ICD-10-CM

## 2015-03-21 DIAGNOSIS — R531 Weakness: Secondary | ICD-10-CM

## 2015-03-21 DIAGNOSIS — Z89512 Acquired absence of left leg below knee: Secondary | ICD-10-CM

## 2015-03-21 DIAGNOSIS — M2560 Stiffness of unspecified joint, not elsewhere classified: Secondary | ICD-10-CM

## 2015-03-21 NOTE — Patient Instructions (Signed)
Do each exercise 1-2  times per day Do each exercise 10-15 repetitions Hold each exercise for 3-5 seconds to feel your location  AT [place].  USE TAPE ON FLOOR TO MARK THE MIDLINE POSITION. You also should try to feel with your limb pressure in socket.  You are trying to feel with limb what you used to feel with the bottom of your foot.  1. Side to Side Shift: Moving your hips only (not shoulders): move weight onto your left leg, HOLD/FEEL.  Move back to equal weight on each leg, HOLD/FEEL. Move weight onto your right leg, HOLD/FEEL. Move back to equal weight on each leg, HOLD/FEEL. Repeat. 2. Front to Back Shift: Moving your hips only (not shoulders): move your weight forward onto your toes, HOLD/FEEL. Move your weight back to equal Flat Foot on both legs, HOLD/FEEL. Move your weight back onto your heels, HOLD/FEEL. Move your weight back to equal on both legs, HOLD/FEEL. Repeat. 3. Moving Cones / Cups: With equal weight on each leg: Hold on with one hand the first time, then progress to no hand supports. Move cups from one side of sink to the other. Place cups ~2" out of your reach, progress to 10" beyond reach. 4. Overhead/Upward Reaching: alternated reaching up to top cabinets or ceiling if no cabinets present. Keep equal weight on each leg. Start with one hand support on counter while other hand reaches and progress to no hand support with reaching. 5.   Looking Over Shoulders: With equal weight on each leg: alternate turning to look          over your shoulders with one hand support on counter as needed. Shift weight to             side looking, pull hip then shoulder then head/eyes around to look behind you. Start       with one hand support & progress to no hand support. 6.   Alternate Stepping Into Cabinet: Open cabinet under sink. Alternate placing forefoot of foot on lower shelf. Try to let go and hold balance for 3-5  seconds.  7.   Side Stepping: Walk to right and to left near counter for support. Try to take 10-15 steps in each direction which may take several reps depending on cabinet length. 8.   Marching forward & backward: Use counter top & cane, march forward picking up your feet then march backwards picking up your feet. Try to take 10-15 steps on each leg which may take several reps depending on cabinet length.

## 2015-03-21 NOTE — Therapy (Signed)
[facility] [address] [place], [place], [address] Phone: [phone]   Fax:  [phone]  Physical Therapy Treatment  Patient Details  Name: [name] MRN: [id number] Date of Birth: 06-22-1955 Referring Provider: Meridee Score, MD  Encounter Date: 03/21/2015      PT End of Session - 03/21/15 0930    Visit Number 2   Number of Visits 21   Date for PT Re-Evaluation 05/27/15   PT Start Time 0840   PT Stop Time 0930   PT Time Calculation (min) 50 min   Equipment Utilized During Treatment Gait belt   Activity Tolerance Patient tolerated treatment well   Behavior During Therapy [facility] for tasks assessed/performed      Past Medical History  Diagnosis Date  . CAD (coronary artery disease) October 2010    a. 02/2009 NSTEMI/Cath: 3VD; b. 02/2009 CABG x 5: LIMA->LAD, VG->Diag, VG->RI, VG->RVM->RPDA.  Marland Kitchen Hyperlipidemia   . Hypertension   . PVD (peripheral vascular disease) ([place])     a. s/p toe amputations on L foot.  . Bicuspid aortic valve     a. 02/2009 s/p tissue AVR;  b. 07/2014 Echo: EF 55-60%, basal-mid inf HK, mild AS/MR, mod dil LA, PASP 44 mmHg.  Marland Kitchen GERD (gastroesophageal reflux disease)   . Diabetes mellitus     Type II  . Heart murmur     Past Surgical History  Procedure Laterality Date  . Amputation      Left great and second toes 2011  . Amputation      Left great and second toes 2011  . Aortic valve replacement      Pericardial tissue valve  . Coronary artery bypass graft  10  . Amputation  07/27/2011    Procedure: AMPUTATION FOOT;  Surgeon: Newt Minion, MD;  Location: [place];  Service: Orthopedics;  Laterality: Left;  Left Midfoot Amputation   . Amputation Left 12/08/2014    Procedure: Left Below Knee Amputation;  Surgeon: Newt Minion, MD;  Location: [facility];  Service: Orthopedics;  Laterality: Left;    There were no vitals filed for this visit.  Visit Diagnosis:  Abnormality of  gait  Unsteadiness  Balance problems  Foot drop, right  Weakness generalized  Stiffness due to immobility  Decreased functional activity tolerance  Status post below knee amputation of left lower extremity ([place])  Encounter for prosthetic gait training      Subjective Assessment - 03/21/15 0847    Subjective No falls. He has been using Tegaderm as PT advised.    Currently in Pain? No/denies                         [facility] Adult PT Treatment/Exercise - 03/21/15 0840    Transfers   Transfers Sit to Stand;Stand to Sit   Sit to Stand 5: Supervision;With upper extremity assist;With armrests;From chair/3-in-1  Min guard for stabilization   Stand to Sit 5: Supervision;With upper extremity assist;With armrests;To chair/3-in-1   Ambulation/Gait   Ambulation/Gait Yes   Ambulation/Gait Assistance 4: Min assist   Ambulation/Gait Assistance Details AFO Ottobock Reaction on RLE. PT verbally instructed in posture and RLE step length.    Ambulation Distance (Feet) 250 Feet  250' X 2   Assistive device Prostheses;Straight cane  Right Ottobock Reaction AFO   Gait Pattern Step-through pattern;Decreased arm swing - left;Decreased step length - right;Decreased stance time - left;Decreased stride length;Decreased hip/knee flexion - left;Decreased dorsiflexion -  right;Trunk flexed;Abducted - left;Poor foot clearance - right   Ambulation Surface Indoor;Level   Stairs Yes   Stairs Assistance 5: Supervision   Stairs Assistance Details (indicate cue type and reason) PT demo, verbally instructd   Stair Management Technique One rail Right;One rail Left;Two rails;With cane;Forwards;Alternating pattern   Number of Stairs 4  3 reps   Ramp 4: Min assist  single point cane, prosthesis & AFO   Ramp Details (indicate cue type and reason) PT demo, instructed posture, step length and wt shift   Curb 4: Min assist  single point cane, prosthesis, AFO   Curb Details (indicate cue type and  reason) PT demo, instructed technique including stepping thru with 2nd leg to facilitate balance reaction   Prosthetics   Prosthetic Care Comments  Continue Tegaderm wear with prosthesis ~2 more days. PT instructed pt in skin checking with noted redness of hair follicles indicating "heat rash" with pt increasing wear too rapidly.    Current prosthetic wear tolerance (days/week)  daily   Current prosthetic wear tolerance (#hours/day)  reports most of awake hours; PT advised 4hrs on, 2 hrs off rotation throughout his awake hours; his limb needs to 2 hr off to enable skin to dry until his limb accommodates to liner / heat of system   Current prosthetic weight-bearing tolerance (hours/day)  pateint tolerated 15 minutes of standing with no complaints   Edema none   Residual limb condition  62mm superficial wound almost healed at distal tibia.    Education Provided Skin check;Residual limb care;Proper Donning;Proper wear schedule/adjustment;Proper weight-bearing schedule/adjustment;Other (comment)  See prosthetic care comments & wear   Person(s) Educated Patient   Education Method Explanation;Demonstration;Tactile cues;Verbal cues   Education Method Verbalized understanding;Returned demonstration;Tactile cues required;Verbal cues required;Needs further instruction                PT Education - 03/21/15 0930    Education provided Yes   Education Details HEP at sink with instruction for proprioception & technique (See pt instructions)   Person(s) Educated Patient   Methods Explanation;Demonstration;Tactile cues;Verbal cues;Handout   Comprehension Verbalized understanding;Returned demonstration;Verbal cues required;Tactile cues required;Need further instruction          PT Short Term Goals - 03/16/15 1400    PT SHORT TERM GOAL #1   Title patient tolerates wear of prosthesis >10 hrs total per day with no new skin issues, current wound healing and no tenderness. (Target Date: 04/15/2015)    Time 1   Period Months   Status New   PT SHORT TERM GOAL #2   Title Patient verbalizes & demonstrates proper donning, cleaning and skin checking.  (Target Date: 04/15/2015)   Time 1   Period Months   Status New   PT SHORT TERM GOAL #3   Title Berg Balance >/= 20 /56.  (Target Date: 04/15/2015)   Time 1   Period Months   Status New   PT SHORT TERM GOAL #4   Title Patient ambulates 200' with single point cane & prosthesis with supervision. (Target Date: 04/15/2015)   Time 1   Period Months   Status New   PT SHORT TERM GOAL #5   Title Patient negotiates ramps, curbs & stairs (1 rail) with single point cane & prosthesis with minA. (Target Date: 04/15/2015)   Time 1   Period Months   Status New           PT Long Term Goals - 03/16/15 1500    PT LONG TERM GOAL #  1   Title Pateint verbalizes and demonstrates proper prosthetic care to enable safe use of prosthesis. (Target Date: 05/27/2015)   Time 10   Period Weeks   Status New   PT LONG TERM GOAL #2   Title Patient tolerates daily wear of prosthesis >90% of awake hours without skin issues or limb pain to enable potential for function throughout his day. (Target Date: 05/27/2015)   Time 10   Period Weeks   Status New   PT LONG TERM GOAL #3   Title Patient ambulates 100' around furniture with prosthesis only carrying household item modified independent. (Target Date: 05/27/2015)   Time 10   Period Weeks   Status New   PT LONG TERM GOAL #4   Title Patient ambulates 300' with single point cane and prosthesis on outdoor surfaces modified independent. (Target Date: 05/27/2015)   Time 10   Period Weeks   Status New   PT LONG TERM GOAL #5   Title Patient negotiates ramps, curbs and stairs (1 rail) with single point cane and prosthesis modified independent. (Target Date: 05/27/2015)   Time 10   Period Weeks   Status New   Additional Long Term Goals   Additional Long Term Goals Yes   PT LONG TERM GOAL #6   Title Berg Balance >/= 22/56  to decrease risk of falls. (Target Date: 05/27/2015)   Time 10   Period Weeks   Status New               Plan - 03/21/15 0930    Clinical Impression Statement Patient's wound on residual limb appears to be healing but he appears to have heat rash still from increasing wear too rapidly. His balance and gait appeared more steady with AFO than without AFO. But patient reports he was uncertain if it helped. Patient appears to understand HEP including proprioceptive component.     Pt will benefit from skilled therapeutic intervention in order to improve on the following deficits Abnormal gait;Decreased activity tolerance;Decreased balance;Decreased endurance;Decreased mobility;Decreased strength;Prosthetic Dependency;Postural dysfunction   Rehab Potential Good   PT Frequency 2x / week   PT Duration Other (comment)  10 weeks   PT Treatment/Interventions ADLs/Self Care Home Management;Functional mobility training;Stair training;Gait training;Therapeutic activities;Therapeutic exercise;DME Instruction;Balance training;Neuromuscular re-education;Patient/family education;Prosthetic Training;Orthotic Fit/Training   PT Next Visit Plan balance and gait with Ottobock Reaction AFO on RLE& prosthesis with single point cane, review prosthetic care   Consulted and Agree with Plan of Care Patient        Problem List Patient Active Problem List   Diagnosis Date Noted  . Below knee amputation status ([place]) 12/08/2014  . Bicuspid aortic valve   . Hyperlipidemia   . Hypertension   . Greater trochanter fracture ([place]) 07/28/2014  . Hip fracture, right ([place]) 07/28/2014  . Diabetes mellitus without complication ([place]) 123XX123  . HLD (hyperlipidemia) 07/28/2014  . GERD (gastroesophageal reflux disease) 07/28/2014  . Hip fracture ([place]) 07/28/2014  . Fall 07/28/2014  . Leukocytosis 07/28/2014  . Thrombocytopenia ([place]) 07/28/2014  . H/O aortic valve replacement 10/16/2011  . CAD, AUTOLOGOUS BYPASS  GRAFT 05/25/2009  . PLEURAL EFFUSION 05/25/2009  . OPEN WOUND OF CHEST , COMPLICATED 123XX123  . PROSTHETIC VALVE - BIO/ ENDOPROSTHESIS 05/25/2009  . DM 04/19/2009  . HYPERLIPIDEMIA 04/19/2009  . Essential hypertension 04/19/2009  . CAD (coronary artery disease) 04/19/2009  . Peripheral vascular disease ([place]) 04/19/2009  . OSTEOMYELITIS 04/19/2009  . BICUSPID AORTIC VALVE 04/19/2009    Jane Broughton PT, DPT 03/21/2015, 11:01  [facility] [address] [place], [place], [address] Phone: [phone]   Fax:  [phone]  Name: [name] MRN: [id number] Date of Birth: 1956/04/06

## 2015-03-24 ENCOUNTER — Ambulatory Visit: Payer: BLUE CROSS/BLUE SHIELD | Admitting: Physical Therapy

## 2015-03-24 ENCOUNTER — Encounter: Payer: Self-pay | Admitting: Physical Therapy

## 2015-03-24 DIAGNOSIS — M21371 Foot drop, right foot: Secondary | ICD-10-CM

## 2015-03-24 DIAGNOSIS — Z89512 Acquired absence of left leg below knee: Secondary | ICD-10-CM

## 2015-03-24 DIAGNOSIS — R6889 Other general symptoms and signs: Secondary | ICD-10-CM

## 2015-03-24 DIAGNOSIS — R2681 Unsteadiness on feet: Secondary | ICD-10-CM

## 2015-03-24 DIAGNOSIS — M2560 Stiffness of unspecified joint, not elsewhere classified: Secondary | ICD-10-CM

## 2015-03-24 DIAGNOSIS — R269 Unspecified abnormalities of gait and mobility: Secondary | ICD-10-CM

## 2015-03-24 DIAGNOSIS — Z7409 Other reduced mobility: Secondary | ICD-10-CM

## 2015-03-24 DIAGNOSIS — R531 Weakness: Secondary | ICD-10-CM

## 2015-03-24 DIAGNOSIS — Z4789 Encounter for other orthopedic aftercare: Secondary | ICD-10-CM

## 2015-03-24 DIAGNOSIS — R2689 Other abnormalities of gait and mobility: Secondary | ICD-10-CM

## 2015-03-24 NOTE — Therapy (Signed)
[facility] [address] [place], [place], [address] Phone: [phone]   Fax:  [phone]  Physical Therapy Treatment  Patient Details  Name: [name] MRN: [id number] Date of Birth: 24-Oct-1955 Referring Provider: Meridee Score, MD  Encounter Date: 03/24/2015      PT End of Session - 03/24/15 0936    Visit Number 3   Number of Visits 21   Date for PT Re-Evaluation 05/27/15   PT Start Time 0845   PT Stop Time 0930   PT Time Calculation (min) 45 min   Equipment Utilized During Treatment Gait belt   Activity Tolerance Patient tolerated treatment well   Behavior During Therapy [facility] for tasks assessed/performed      Past Medical History  Diagnosis Date  . CAD (coronary artery disease) October 2010    a. 02/2009 NSTEMI/Cath: 3VD; b. 02/2009 CABG x 5: LIMA->LAD, VG->Diag, VG->RI, VG->RVM->RPDA.  Marland Kitchen Hyperlipidemia   . Hypertension   . PVD (peripheral vascular disease) ([place])     a. s/p toe amputations on L foot.  . Bicuspid aortic valve     a. 02/2009 s/p tissue AVR;  b. 07/2014 Echo: EF 55-60%, basal-mid inf HK, mild AS/MR, mod dil LA, PASP 44 mmHg.  Marland Kitchen GERD (gastroesophageal reflux disease)   . Diabetes mellitus     Type II  . Heart murmur     Past Surgical History  Procedure Laterality Date  . Amputation      Left great and second toes 2011  . Amputation      Left great and second toes 2011  . Aortic valve replacement      Pericardial tissue valve  . Coronary artery bypass graft  10  . Amputation  07/27/2011    Procedure: AMPUTATION FOOT;  Surgeon: Newt Minion, MD;  Location: [place];  Service: Orthopedics;  Laterality: Left;  Left Midfoot Amputation   . Amputation Left 12/08/2014    Procedure: Left Below Knee Amputation;  Surgeon: Newt Minion, MD;  Location: [facility];  Service: Orthopedics;  Laterality: Left;    There were no vitals filed for this visit.  Visit Diagnosis:  Abnormality of  gait  Unsteadiness  Balance problems  Foot drop, right  Weakness generalized  Decreased functional activity tolerance  Status post below knee amputation of left lower extremity ([place])  Encounter for prosthetic gait training  Stiffness due to immobility            Subjective Assessment - 03/24/15 0944    Subjective Pt reports no pain or excess pressure on residual limb, eventhough there does appear to be redness on the anterior aspect. Pt reports no pain and no changes in status.    Patient Stated Goals to use prosthesis walk without cane in community.    Currently in Pain? No/denies   Multiple Pain Sites No     NMR: To increase proprioception during weight shift and equal weight bearing to improve stability during daily activities. Pt min- mod assist with balance challenges. Verbal and tactile cues provided for righting balance.  1. Double leg stance on rocker board in parallel bars: Front to back, 59min, with eyes open with intermittent UE support. Side to side, 72min with eyes open with intermittent UE support. 2. Single leg stance foam bubble toe taps in parallel bars: 30 sec each leg for 3 reps each leg. Pt with UE support 75% of the time. 3. Double leg stance without UE support in parallel bars  while reaching for cane in multiple directions.       03/24/15 0001  Ambulation/Gait  Ambulation/Gait Yes (Pt presents with decreased step width, foot slap R w/o AFO.)  Ambulation/Gait Assistance 4: Min assist  Ambulation/Gait Assistance Details pt with decreased step lenght and weight shifting onto prosthesis with all gait. Improved stablilty/balance noted with use of anterior ottobock brace applied to right side. cues needed to correct gait deviations noted. used line on floor to assist with correct base of support and use of mirrors to promote upright posture/improved base of support.                                   (Pt presents with decreased stance time on L.)   Ambulation Distance (Feet) 245 Feet (+ multipl laps between mirrors to correct gt deviations)  Assistive device Straight cane;Prosthesis (anterior reaction ottobock brace to right leg)  Gait Pattern Step-through pattern;Decreased arm swing - left;Decreased step length - right;Decreased stance time - left;Decreased stride length;Decreased hip/knee flexion - left;Decreased dorsiflexion - right;Trunk flexed;Abducted - left;Poor foot clearance - right  Ambulation Surface Indoor;Level  Prosthetics  Prosthetic Care Comments  Some redness on anterior aspect of knee and shin noted today. Pt reported no pain at these areas. Edcuated on used of socks if pressure is noted and on use of baby oil at patella/up to decrease any friction from the liner with knee flexion/extension. Pt re-educated on use of lotion only at night and to wipe it off before am application of liner. Also edcuated pt on use of antipersperant to decrease sweating with liner/prosthesis wear.                               Current prosthetic wear tolerance (days/week)  daily  Current prosthetic wear tolerance (#hours/day)  3-4 hr with 2 hr break thoroughout awake hours.  Current prosthetic weight-bearing tolerance (hours/day)  Pt tolerates 15-79min without rest break.  Edema none  Residual limb condition  wound appears healed  Education Provided Skin check  Person(s) Educated Patient  Education Method Explanation;Demonstration;Verbal cues  Education Method Verbalized understanding;Needs further instruction  Donning Prosthesis 5  Doffing Prosthesis 5         PT Education - 03/24/15 0932    Education provided Yes   Education Details Pt educated on residual limb care (lotion, anitpersperant, baby oil).  Pt instructed to walk with cane when outside the house.   Person(s) Educated Patient   Methods Explanation   Comprehension Verbalized understanding          PT Short Term Goals - 03/24/15 0944    PT SHORT TERM GOAL #1   Title  patient tolerates wear of prosthesis >10 hrs total per day with no new skin issues, current wound healing and no tenderness. (Target Date: 04/15/2015)   Time 1   Period Months   Status New   PT SHORT TERM GOAL #2   Title Patient verbalizes & demonstrates proper donning, cleaning and skin checking.  (Target Date: 04/15/2015)   Time 1   Period Months   Status New   PT SHORT TERM GOAL #3   Title Berg Balance >/= 20 /56.  (Target Date: 04/15/2015)   Time 1   Period Months   Status New   PT SHORT TERM GOAL #4   Title Patient ambulates 200' with single point cane &  prosthesis with supervision. (Target Date: 04/15/2015)   Time 1   Period Months   Status New   PT SHORT TERM GOAL #5   Title Patient negotiates ramps, curbs & stairs (1 rail) with single point cane & prosthesis with minA. (Target Date: 04/15/2015)   Time 1   Period Months   Status New           PT Long Term Goals - 03/24/15 0944    PT LONG TERM GOAL #1   Title Pateint verbalizes and demonstrates proper prosthetic care to enable safe use of prosthesis. (Target Date: 05/27/2015)   Time 10   Period Weeks   Status New   PT LONG TERM GOAL #2   Title Patient tolerates daily wear of prosthesis >90% of awake hours without skin issues or limb pain to enable potential for function throughout his day. (Target Date: 05/27/2015)   Time 10   Period Weeks   Status New   PT LONG TERM GOAL #3   Title Patient ambulates 100' around furniture with prosthesis only carrying household item modified independent. (Target Date: 05/27/2015)   Time 10   Period Weeks   Status New   PT LONG TERM GOAL #4   Title Patient ambulates 300' with single point cane and prosthesis on outdoor surfaces modified independent. (Target Date: 05/27/2015)   Time 10   Period Weeks   Status New   PT LONG TERM GOAL #5   Title Patient negotiates ramps, curbs and stairs (1 rail) with single point cane and prosthesis modified independent. (Target Date: 05/27/2015)   Time  10   Period Weeks   Status New   PT LONG TERM GOAL #6   Title Berg Balance >/= 22/56 to decrease risk of falls. (Target Date: 05/27/2015)   Time Wall - 03/24/15 [id number]    Clinical Impression Statement Pt's wound on residual limb appears to be healed. Pt expressed knowledge of consequences of increasing wear too rapidly. Pt's gait quality increased significantly with use of AFO. Pt with LOB with standing from seated position. Pt advised to ambulate with cane for increased steadiness during gait. Gait without AFO presents with many gait deviations (see flowsheet).      Pt will benefit from skilled therapeutic intervention in order to improve on the following deficits Abnormal gait;Decreased activity tolerance;Decreased balance;Decreased endurance;Decreased mobility;Decreased strength;Prosthetic Dependency;Postural dysfunction   Rehab Potential Good   PT Frequency 2x / week   PT Duration Other (comment)  10 weeks   PT Treatment/Interventions ADLs/Self Care Home Management;Functional mobility training;Stair training;Gait training;Therapeutic activities;Therapeutic exercise;DME Instruction;Balance training;Neuromuscular re-education;Patient/family education;Prosthetic Training;Orthotic Fit/Training   PT Next Visit Plan Continue use of single tip cane with AFO. Continue gait training and balance activities to promote equal weight bearing through LE's and weight shifting.    Consulted and Agree with Plan of Care Patient        Problem List Patient Active Problem List   Diagnosis Date Noted  . Below knee amputation status ([place]) 12/08/2014  . Bicuspid aortic valve   . Hyperlipidemia   . Hypertension   . Greater trochanter fracture ([place]) 07/28/2014  . Hip fracture, right ([place]) 07/28/2014  . Diabetes mellitus without complication ([place]) 123XX123  . HLD (hyperlipidemia) 07/28/2014  . GERD (gastroesophageal reflux disease) 07/28/2014  . Hip fracture  ([place]) 07/28/2014  . Fall 07/28/2014  . Leukocytosis 07/28/2014  . Thrombocytopenia ([place]) 07/28/2014  .  H/O aortic valve replacement 10/16/2011  . 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 PLEURAL EFFUSION 05/25/2009  . OPEN WOUND OF CHEST , COMPLICATED 123XX123  . PROSTHETIC VALVE - BIO/ ENDOPROSTHESIS 05/25/2009  . DM 04/19/2009  . HYPERLIPIDEMIA 04/19/2009  . Essential hypertension 04/19/2009  . CAD (coronary artery disease) 04/19/2009  . Peripheral vascular disease ([place]) 04/19/2009  . OSTEOMYELITIS 04/19/2009  . BICUSPID AORTIC VALVE 04/19/2009    Laney Potash 03/24/2015, 10:09 AM  Laney Potash, SPTA  Name: Zakarya Frischman MRN: [id number] Date of Birth: Nov 12, 1955  This note has been reviewed and edited by supervising CI.  Willow Ora, PTA, [facility] [address] [place], [place] [address] [phone] 03/25/2015, 11:43 AM

## 2015-03-28 ENCOUNTER — Encounter: Payer: Self-pay | Admitting: Physical Therapy

## 2015-03-28 ENCOUNTER — Ambulatory Visit: Payer: BLUE CROSS/BLUE SHIELD | Admitting: Physical Therapy

## 2015-03-28 DIAGNOSIS — R269 Unspecified abnormalities of gait and mobility: Secondary | ICD-10-CM | POA: Diagnosis not present

## 2015-03-28 DIAGNOSIS — R6889 Other general symptoms and signs: Secondary | ICD-10-CM

## 2015-03-28 DIAGNOSIS — Z89512 Acquired absence of left leg below knee: Secondary | ICD-10-CM

## 2015-03-28 DIAGNOSIS — R2689 Other abnormalities of gait and mobility: Secondary | ICD-10-CM

## 2015-03-28 DIAGNOSIS — M21371 Foot drop, right foot: Secondary | ICD-10-CM

## 2015-03-28 DIAGNOSIS — R2681 Unsteadiness on feet: Secondary | ICD-10-CM

## 2015-03-28 DIAGNOSIS — M2560 Stiffness of unspecified joint, not elsewhere classified: Secondary | ICD-10-CM

## 2015-03-28 DIAGNOSIS — Z4789 Encounter for other orthopedic aftercare: Secondary | ICD-10-CM

## 2015-03-28 DIAGNOSIS — Z7409 Other reduced mobility: Secondary | ICD-10-CM

## 2015-03-28 DIAGNOSIS — R531 Weakness: Secondary | ICD-10-CM

## 2015-03-28 NOTE — Therapy (Signed)
[facility] [address] [place], [place], [address] Phone: [phone]   Fax:  [phone]  Physical Therapy Treatment  Patient Details  Name: [name] MRN: [id number] Date of Birth: 10/04/1955 Referring Provider: Meridee Score, MD  Encounter Date: 03/28/2015      PT End of Session - 03/28/15 1108    Visit Number 4   Number of Visits 21   Date for PT Re-Evaluation 05/27/15   PT Start Time 0845   PT Stop Time 0930   PT Time Calculation (min) 45 min   Equipment Utilized During Treatment Gait belt   Activity Tolerance Patient tolerated treatment well   Behavior During Therapy [facility] for tasks assessed/performed      Past Medical History  Diagnosis Date  . CAD (coronary artery disease) October 2010    a. 02/2009 NSTEMI/Cath: 3VD; b. 02/2009 CABG x 5: LIMA->LAD, VG->Diag, VG->RI, VG->RVM->RPDA.  Marland Kitchen Hyperlipidemia   . Hypertension   . PVD (peripheral vascular disease) ([place])     a. s/p toe amputations on L foot.  . Bicuspid aortic valve     a. 02/2009 s/p tissue AVR;  b. 07/2014 Echo: EF 55-60%, basal-mid inf HK, mild AS/MR, mod dil LA, PASP 44 mmHg.  Marland Kitchen GERD (gastroesophageal reflux disease)   . Diabetes mellitus     Type II  . Heart murmur     Past Surgical History  Procedure Laterality Date  . Amputation      Left great and second toes 2011  . Amputation      Left great and second toes 2011  . Aortic valve replacement      Pericardial tissue valve  . Coronary artery bypass graft  10  . Amputation  07/27/2011    Procedure: AMPUTATION FOOT;  Surgeon: Newt Minion, MD;  Location: [facility];  Service: Orthopedics;  Laterality: Left;  Left Midfoot Amputation   . Amputation Left 12/08/2014    Procedure: Left Below Knee Amputation;  Surgeon: Newt Minion, MD;  Location: [facility];  Service: Orthopedics;  Laterality: Left;    There were no vitals filed for this visit.  Visit Diagnosis:  Abnormality of  gait  Unsteadiness  Balance problems  Foot drop, right  Weakness generalized  Decreased functional activity tolerance  Status post below knee amputation of left lower extremity ([place])  Encounter for prosthetic gait training  Stiffness due to immobility      Subjective Assessment - 03/28/15 1104    Subjective Pt reports no pain, no falls, and no change in status. "I never use the cane unless I come to therapy." Pt reports adhering to wear schedule.    Patient Stated Goals to use prosthesis walk without cane in community.    Currently in Pain? No/denies   Multiple Pain Sites No     Prosthetic Training: To normalize gait with prosthesis on L and AFO on R to increase pt's functional independence. Pt with anterior [place] AFO on R for the duration of treatment.  1. Pt ambulated 760 ft total during session with min guard to min assist due to instability. Verbal cues for cane use, weight shifting onto prosthesis, maintaining stance width with R LE. Pt completed 300 ft with use of mirror for visual feedback to correct stance width. Pt completed 115 ft X4 on indoor level surfaces.  2. Pt completed 4 steps with use of hand rail on left with supervision assist and verbal cues for cane use. Step to pattern  utilized.   3. Pt completed 3 reps of curb and ramp each with up and down. Pt required min assist with ascent and descent of curb. Pt appears to be limited with ascent due to LE weakness. Pt required min guard on ramp with verbal cues for decreased step length.  NMR: To increase pt's balance on level and unlevel surfaces, and to encourage righting of balance when bending forward and reaching in different directions. Pt min guard during balance challenges. Pt supervision with challenges while in parallel bars with UE support  1. Pt. ambulated on compliant blue mat for 4 laps with cane assist to simulate outdoor surfaces.  2. Pt completed 1 min of static standing balance on compliant blue  mat. 30 sec with Wide BOS and 30 sec with normal BOS.  3. Pt completed blocked practice of reaching for cane in varied directions in parallel bars on compliant blue mat with intermittent UE support.  4.Pt completed 5 reps of bending to pick up object off of floor while in parallel bars and standing on compliant blue mat. Pt with slight LOB during the first 3 reps upon trunk extension/return to standing. Cues on foot positioning and technique.       [facility] Adult PT Treatment/Exercise - 03/28/15 0001    Prosthetics   Prosthetic Care Comments  Some redness still visible on anterior aspect of knee. Pt did not report any pain. It was noted that Prosthesis appears to be 3/8" short. Pt seeing prosthetist today and will have this adjusted.   Current prosthetic wear tolerance (days/week)  daily   Current prosthetic wear tolerance (#hours/day)  4hrs of wear with patting dry after 2hrs then after 4 hrs total of wear take a 2 hr. Break. Repeat pattern.    Current prosthetic weight-bearing tolerance (hours/day)  Pt tolerates 15-25min without rest break.   Edema none   Residual limb condition  .25" blister over newly healed wound on the bottom of residual limb. Blister appeared to be closed with fluid inside. Pt advised to leave as is with liner and shrinker use.  Pt was given Tegaderm for over the holidays if blister opens up only.   Education Provided Skin check;Residual limb care   Person(s) Educated Patient   Education Method Explanation   Education Method Verbalized understanding;Needs further instruction   Donning Prosthesis Supervision   Doffing Prosthesis Supervision           PT Education - 03/28/15 1106    Education provided Yes   Education Details Pt's residual limb presented with blister. Pt was educated on signs of sweating, and PTA adjusted pt's wear schedule to prevent skin breakdown (see prosthetics).    Person(s) Educated Patient   Methods Explanation   Comprehension Verbalized  understanding          PT Short Term Goals - 03/28/15 1117    PT SHORT TERM GOAL #1   Title patient tolerates wear of prosthesis >10 hrs total per day with no new skin issues, current wound healing and no tenderness. (Target Date: 04/15/2015)   Time 1   Period Months   Status New   PT SHORT TERM GOAL #2   Title Patient verbalizes & demonstrates proper donning, cleaning and skin checking.  (Target Date: 04/15/2015)   Time 1   Period Months   Status New   PT SHORT TERM GOAL #3   Title Berg Balance >/= 20 /56.  (Target Date: 04/15/2015)   Time 1   Period Months  Status New   PT SHORT TERM GOAL #4   Title Patient ambulates 200' with single point cane & prosthesis with supervision. (Target Date: 04/15/2015)   Time 1   Period Months   Status New   PT SHORT TERM GOAL #5   Title Patient negotiates ramps, curbs & stairs (1 rail) with single point cane & prosthesis with minA. (Target Date: 04/15/2015)   Time 1   Period Months   Status New           PT Long Term Goals - 03/28/15 1117    PT LONG TERM GOAL #1   Title Pateint verbalizes and demonstrates proper prosthetic care to enable safe use of prosthesis. (Target Date: 05/27/2015)   Time 10   Period Weeks   Status New   PT LONG TERM GOAL #2   Title Patient tolerates daily wear of prosthesis >90% of awake hours without skin issues or limb pain to enable potential for function throughout his day. (Target Date: 05/27/2015)   Time 10   Period Weeks   Status New   PT LONG TERM GOAL #3   Title Patient ambulates 100' around furniture with prosthesis only carrying household item modified independent. (Target Date: 05/27/2015)   Time 10   Period Weeks   Status New   PT LONG TERM GOAL #4   Title Patient ambulates 300' with single point cane and prosthesis on outdoor surfaces modified independent. (Target Date: 05/27/2015)   Time 10   Period Weeks   Status New   PT LONG TERM GOAL #5   Title Patient negotiates ramps, curbs and stairs (1  rail) with single point cane and prosthesis modified independent. (Target Date: 05/27/2015)   Time 10   Period Weeks   Status New   PT LONG TERM GOAL #6   Title Berg Balance >/= 22/56 to decrease risk of falls. (Target Date: 05/27/2015)   Time 10   Period Weeks   Status New               Plan - 03/28/15 1108    Clinical Impression Statement Pt still needs to be monitored each session for skin breakdown. Pt unsteady with gait due to decrease weight shift onto L prosthesis. Pt needs to use cane to prevent/correct gait deviations pt demo's with gait without device use, however pt tends to carry cane and not sue it with gait despite cues. Pt to see prosthetist today for adjustments and possible AFO on R. It appears that the anterior [place] AFO on R improves gait significantly. Pt negotiates stairs with supervision assist with use of L hand rail and cane on R. Pt appears steady on steps with UE support. Progressing toward goals.   Pt will benefit from skilled therapeutic intervention in order to improve on the following deficits Abnormal gait;Decreased activity tolerance;Decreased balance;Decreased endurance;Decreased mobility;Decreased strength;Prosthetic Dependency;Postural dysfunction   Rehab Potential Good   PT Frequency 2x / week   PT Duration Other (comment)  10 weeks   PT Treatment/Interventions ADLs/Self Care Home Management;Functional mobility training;Stair training;Gait training;Therapeutic activities;Therapeutic exercise;DME Instruction;Balance training;Neuromuscular re-education;Patient/family education;Prosthetic Training;Orthotic Fit/Training   PT Next Visit Plan Gait train with new prosthesis adjustments and orthosis. Check skin and check wear schedule. Further education on use of cane to normalize gait.   Consulted and Agree with Plan of Care Patient        Problem List Patient Active Problem List   Diagnosis Date Noted  . Below knee amputation status ([place]) 12/08/2014   .  Bicuspid aortic valve   . Hyperlipidemia   . Hypertension   . Greater trochanter fracture ([place]) 07/28/2014  . Hip fracture, right ([place]) 07/28/2014  . Diabetes mellitus without complication ([place]) 123XX123  . HLD (hyperlipidemia) 07/28/2014  . GERD (gastroesophageal reflux disease) 07/28/2014  . Hip fracture ([place]) 07/28/2014  . Fall 07/28/2014  . Leukocytosis 07/28/2014  . Thrombocytopenia ([place]) 07/28/2014  . H/O aortic valve replacement 10/16/2011  . 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 PLEURAL EFFUSION 05/25/2009  . OPEN WOUND OF CHEST , COMPLICATED 123XX123  . PROSTHETIC VALVE - BIO/ ENDOPROSTHESIS 05/25/2009  . DM 04/19/2009  . HYPERLIPIDEMIA 04/19/2009  . Essential hypertension 04/19/2009  . CAD (coronary artery disease) 04/19/2009  . Peripheral vascular disease ([place]) 04/19/2009  . OSTEOMYELITIS 04/19/2009  . BICUSPID AORTIC VALVE 04/19/2009    Laney Potash 03/28/2015, 11:55 AM  Laney Potash, SPTA  Name: [name] MRN: [id number] Date of Birth: 20-Jun-1955  This note has been reviewed and edited by supervising CI.  Willow Ora, PTA, [facility] [address] [place], [place] [address] [phone] 03/28/2015, 3:34 PM

## 2015-03-30 ENCOUNTER — Ambulatory Visit: Payer: BLUE CROSS/BLUE SHIELD | Admitting: Physical Therapy

## 2015-03-30 ENCOUNTER — Encounter: Payer: Self-pay | Admitting: Physical Therapy

## 2015-03-30 DIAGNOSIS — R2681 Unsteadiness on feet: Secondary | ICD-10-CM

## 2015-03-30 DIAGNOSIS — Z4789 Encounter for other orthopedic aftercare: Secondary | ICD-10-CM

## 2015-03-30 DIAGNOSIS — M21371 Foot drop, right foot: Secondary | ICD-10-CM

## 2015-03-30 DIAGNOSIS — R269 Unspecified abnormalities of gait and mobility: Secondary | ICD-10-CM | POA: Diagnosis not present

## 2015-03-30 DIAGNOSIS — M2560 Stiffness of unspecified joint, not elsewhere classified: Secondary | ICD-10-CM

## 2015-03-30 DIAGNOSIS — Z7409 Other reduced mobility: Secondary | ICD-10-CM

## 2015-03-30 DIAGNOSIS — R6889 Other general symptoms and signs: Secondary | ICD-10-CM

## 2015-03-30 DIAGNOSIS — Z89512 Acquired absence of left leg below knee: Secondary | ICD-10-CM

## 2015-03-30 DIAGNOSIS — R2689 Other abnormalities of gait and mobility: Secondary | ICD-10-CM

## 2015-03-30 DIAGNOSIS — R531 Weakness: Secondary | ICD-10-CM

## 2015-03-30 NOTE — Patient Instructions (Signed)
Prosthetic care, prosthetic cleaning, wound care.

## 2015-03-30 NOTE — Therapy (Signed)
[facility] [address] [place], [place], [address] Phone: [phone]   Fax:  [phone]  Physical Therapy Treatment  Patient Details  Name: [name] MRN: [id number] Date of Birth: 01-11-1956 Referring Provider: Meridee Score, MD  Encounter Date: 03/30/2015      PT End of Session - 03/30/15 1023    Visit Number 5   Number of Visits 21   Date for PT Re-Evaluation 05/27/15   PT Start Time 0847   PT Stop Time 0929   PT Time Calculation (min) 42 min   Equipment Utilized During Treatment Gait belt   Activity Tolerance Patient tolerated treatment well   Behavior During Therapy [facility] for tasks assessed/performed      Past Medical History  Diagnosis Date  . CAD (coronary artery disease) October 2010    a. 02/2009 NSTEMI/Cath: 3VD; b. 02/2009 CABG x 5: LIMA->LAD, VG->Diag, VG->RI, VG->RVM->RPDA.  Marland Kitchen Hyperlipidemia   . Hypertension   . PVD (peripheral vascular disease) ([place])     a. s/p toe amputations on L foot.  . Bicuspid aortic valve     a. 02/2009 s/p tissue AVR;  b. 07/2014 Echo: EF 55-60%, basal-mid inf HK, mild AS/MR, mod dil LA, PASP 44 mmHg.  Marland Kitchen GERD (gastroesophageal reflux disease)   . Diabetes mellitus     Type II  . Heart murmur     Past Surgical History  Procedure Laterality Date  . Amputation      Left great and second toes 2011  . Amputation      Left great and second toes 2011  . Aortic valve replacement      Pericardial tissue valve  . Coronary artery bypass graft  10  . Amputation  07/27/2011    Procedure: AMPUTATION FOOT;  Surgeon: Newt Minion, MD;  Location: [facility];  Service: Orthopedics;  Laterality: Left;  Left Midfoot Amputation   . Amputation Left 12/08/2014    Procedure: Left Below Knee Amputation;  Surgeon: Newt Minion, MD;  Location: [facility];  Service: Orthopedics;  Laterality: Left;    There were no vitals filed for this visit.  Visit Diagnosis:  Abnormality of  gait  Unsteadiness  Balance problems  Foot drop, right  Decreased functional activity tolerance  Weakness generalized  Status post below knee amputation of left lower extremity ([place])  Encounter for prosthetic gait training  Stiffness due to immobility      Subjective Assessment - 03/30/15 0950    Subjective Pt. reports no pain or falls since last visit.  pt. seen wearing prosthesis on L and plate in shoe of R partial foot.  pt. reports, "the plate makes me feel like i'm wearing a really big clown shoe on the right.".    Patient Stated Goals to use prosthesis walk without cane in community.    Currently in Pain? No/denies   Multiple Pain Sites No     Neuro re-ed (to increase neuromuscular facilitation, proprioception, and awareness of prosthesis): In parallel bars: - standing hip abduction on blue foam pad x 10 reps each side; pt required verbal cues to prevent trunk lateral leaning. - standing hip extension on blue foam pad x 10 reps each side; pt. required verbal cues to prevent trunk flexion.   - standing balance on balance board forward/backward, side/side x 30 sec each way; eyes open, eyes closed; 1-2 UE support.           [facility] Adult PT Treatment/Exercise - 03/30/15 [id number]  Ambulation/Gait   Ambulation/Gait Yes   Ambulation/Gait Assistance 5: Supervision   Ambulation/Gait Assistance Details pt. ambulated with single point cane, prosthesis on L and foot plate insert on RLE.  Pt. demo'd poor foot clearance on R; verbal cues provided for pt. to pick up foot with swing phase for greater clearance on R.       Ambulation Distance (Feet) 506 Feet  506 ft outdoors, 230 ft indoors   Assistive device Straight cane;Prosthesis   Gait Pattern Step-through pattern;Decreased arm swing - left;Decreased step length - right;Decreased stance time - left;Decreased stride length;Decreased hip/knee flexion - left;Decreased dorsiflexion - right;Trunk flexed;Abducted - left;Poor foot  clearance - right   Ambulation Surface Level;Unlevel;Grass;Outdoor;Indoor;Paved   Stairs Yes   Stairs Assistance 5: Supervision   Stairs Assistance Details (indicate cue type and reason) PTA instructed on proper technique with toe hang off stair before descending as pt leads with right leg.   Stair Management Technique One rail Right;Two rails;With cane;Forwards;Alternating pattern   Number of Stairs 4   Ramp 4: Min assist   Ramp Details (indicate cue type and reason) Pt. demo'd occasional unsteadiness with ascending ramp; pt. instructed for shorter step length and cane sequencing.     Curb 4: Min assist   Curb Details (indicate cue type and reason) Pt. demo proper step through for balance reaction ascending and stability, but required verbal cues for toe hang off with descent.     Gait Comments Pt. found to be one quarter of an inch longer on prosthetic side; will continue to monitor prosthetic length and foot clearance on non-prosthetic side.     Prosthetics   Prosthetic Care Comments  Pt.'s blister now opened on distal limb; also noted on distal limb small red area with squared off borders; area most likely a result of degaderm reaction; tegaderm removed; PT notified and agreed with action; pt. instructed to monitor area and keep clean and dry; pt. instructed to decrease wear time if wound appears to get worse.          Current prosthetic wear tolerance (days/week)  daily   Current prosthetic wear tolerance (#hours/day)  4hrs of wear with patting dry after 2hrs then after 4 hrs total of wear take a 2 hr. break.    Current prosthetic weight-bearing tolerance (hours/day)  Pt tolerates 15-40min without rest break.   Edema none   Residual limb condition  see prosthetic care comments.   Education Provided Skin check;Residual limb care;Prosthetic cleaning;Correct ply sock adjustment   Person(s) Educated Patient   Education Method Explanation;Verbal cues   Education Method Verbalized  understanding;Needs further instruction   Donning Prosthesis Supervision   Doffing Prosthesis Supervision            PT Short Term Goals - 03/28/15 1117    PT SHORT TERM GOAL #1   Title patient tolerates wear of prosthesis >10 hrs total per day with no new skin issues, current wound healing and no tenderness. (Target Date: 04/15/2015)   Time 1   Period Months   Status New   PT SHORT TERM GOAL #2   Title Patient verbalizes & demonstrates proper donning, cleaning and skin checking.  (Target Date: 04/15/2015)   Time 1   Period Months   Status New   PT SHORT TERM GOAL #3   Title Berg Balance >/= 20 /56.  (Target Date: 04/15/2015)   Time 1   Period Months   Status New   PT SHORT TERM GOAL #4   Title  Patient ambulates 200' with single point cane & prosthesis with supervision. (Target Date: 04/15/2015)   Time 1   Period Months   Status New   PT SHORT TERM GOAL #5   Title Patient negotiates ramps, curbs & stairs (1 rail) with single point cane & prosthesis with minA. (Target Date: 04/15/2015)   Time 1   Period Months   Status New           PT Long Term Goals - 03/28/15 1117    PT LONG TERM GOAL #1   Title Pateint verbalizes and demonstrates proper prosthetic care to enable safe use of prosthesis. (Target Date: 05/27/2015)   Time 10   Period Weeks   Status New   PT LONG TERM GOAL #2   Title Patient tolerates daily wear of prosthesis >90% of awake hours without skin issues or limb pain to enable potential for function throughout his day. (Target Date: 05/27/2015)   Time 10   Period Weeks   Status New   PT LONG TERM GOAL #3   Title Patient ambulates 100' around furniture with prosthesis only carrying household item modified independent. (Target Date: 05/27/2015)   Time 10   Period Weeks   Status New   PT LONG TERM GOAL #4   Title Patient ambulates 300' with single point cane and prosthesis on outdoor surfaces modified independent. (Target Date: 05/27/2015)   Time 10   Period  Weeks   Status New   PT LONG TERM GOAL #5   Title Patient negotiates ramps, curbs and stairs (1 rail) with single point cane and prosthesis modified independent. (Target Date: 05/27/2015)   Time 10   Period Weeks   Status New   PT LONG TERM GOAL #6   Title Berg Balance >/= 22/56 to decrease risk of falls. (Target Date: 05/27/2015)   Time 10   Period Weeks   Status New            Plan - 03/30/15 1024    Clinical Impression Statement Residual limb blister still open with possible tegaderm adhesive reaction noted.  Pt. instructed on residual limb cleaning and drying to prevent further skin breakdown and promote blister healing.  Pt. shows good activity tolerance but continues to demo poor sequencing of cane with prosthetic ambulation.  Pt. found to be one quarter of an inch longer on prosthetic side; will continue to monitor prosthetic length and foot clearance on non-prosthetic side.     Pt will benefit from skilled therapeutic intervention in order to improve on the following deficits Abnormal gait;Decreased activity tolerance;Decreased balance;Decreased endurance;Decreased mobility;Decreased strength;Prosthetic Dependency;Postural dysfunction   Rehab Potential Good   PT Frequency 2x / week   PT Duration Other (comment)  10 weeks   PT Treatment/Interventions ADLs/Self Care Home Management;Functional mobility training;Stair training;Gait training;Therapeutic activities;Therapeutic exercise;DME Instruction;Balance training;Neuromuscular re-education;Patient/family education;Prosthetic Training;Orthotic Fit/Training   PT Next Visit Plan Gait train with new adjustments and orthosis. Check skin / monitor blister healing and skin breakdown.  Further education on use of cane to normalize gait and drying to prevent skin breakdown.     Consulted and Agree with Plan of Care Patient        Problem List Patient Active Problem List   Diagnosis Date Noted  . Below knee amputation status ([place])  12/08/2014  . Bicuspid aortic valve   . Hyperlipidemia   . Hypertension   . Greater trochanter fracture ([place]) 07/28/2014  . Hip fracture, right ([place]) 07/28/2014  . Diabetes mellitus without complication ([place])  07/28/2014  . HLD (hyperlipidemia) 07/28/2014  . GERD (gastroesophageal reflux disease) 07/28/2014  . Hip fracture ([place]) 07/28/2014  . Fall 07/28/2014  . Leukocytosis 07/28/2014  . Thrombocytopenia ([place]) 07/28/2014  . H/O aortic valve replacement 10/16/2011  . 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 PLEURAL EFFUSION 05/25/2009  . OPEN WOUND OF CHEST , COMPLICATED 123XX123  . PROSTHETIC VALVE - BIO/ ENDOPROSTHESIS 05/25/2009  . DM 04/19/2009  . HYPERLIPIDEMIA 04/19/2009  . Essential hypertension 04/19/2009  . CAD (coronary artery disease) 04/19/2009  . Peripheral vascular disease ([place]) 04/19/2009  . OSTEOMYELITIS 04/19/2009  . BICUSPID AORTIC VALVE 04/19/2009    Bess Harvest 03/30/2015, 10:32 AM  Bess Harvest, SPTA  Name: Latonya Maleki MRN: [id number] Date of Birth: October 16, 1955   This note has been reviewed and edited by supervising CI.  Willow Ora, PTA, [facility] [address], [place], [place] [address] [phone] 03/30/2015, 4:23 PM

## 2015-04-04 ENCOUNTER — Encounter: Payer: Self-pay | Admitting: Physical Therapy

## 2015-04-04 ENCOUNTER — Ambulatory Visit: Payer: BLUE CROSS/BLUE SHIELD | Admitting: Physical Therapy

## 2015-04-04 DIAGNOSIS — Z4789 Encounter for other orthopedic aftercare: Secondary | ICD-10-CM

## 2015-04-04 DIAGNOSIS — R2689 Other abnormalities of gait and mobility: Secondary | ICD-10-CM

## 2015-04-04 DIAGNOSIS — M21371 Foot drop, right foot: Secondary | ICD-10-CM

## 2015-04-04 DIAGNOSIS — M2560 Stiffness of unspecified joint, not elsewhere classified: Secondary | ICD-10-CM

## 2015-04-04 DIAGNOSIS — Z7409 Other reduced mobility: Secondary | ICD-10-CM

## 2015-04-04 DIAGNOSIS — R269 Unspecified abnormalities of gait and mobility: Secondary | ICD-10-CM | POA: Diagnosis not present

## 2015-04-04 DIAGNOSIS — R2681 Unsteadiness on feet: Secondary | ICD-10-CM

## 2015-04-04 DIAGNOSIS — R531 Weakness: Secondary | ICD-10-CM

## 2015-04-04 DIAGNOSIS — R6889 Other general symptoms and signs: Secondary | ICD-10-CM

## 2015-04-04 DIAGNOSIS — Z89512 Acquired absence of left leg below knee: Secondary | ICD-10-CM

## 2015-04-04 NOTE — Therapy (Signed)
[facility] [address] [place], [place], [address] Phone: [phone]   Fax:  [phone]  Physical Therapy Treatment  Patient Details  Name: [name] MRN: [id number] Date of Birth: 1955/07/22 Referring Provider: Meridee Score, MD  Encounter Date: 04/04/2015      PT End of Session - 04/04/15 1256    Visit Number 6   Number of Visits 21   Date for PT Re-Evaluation 05/27/15   PT Start Time [id number]   PT Stop Time 1230   PT Time Calculation (min) 45 min   Equipment Utilized During Treatment Gait belt   Activity Tolerance Patient tolerated treatment well   Behavior During Therapy [facility] for tasks assessed/performed      Past Medical History  Diagnosis Date  . CAD (coronary artery disease) October 2010    a. 02/2009 NSTEMI/Cath: 3VD; b. 02/2009 CABG x 5: LIMA->LAD, VG->Diag, VG->RI, VG->RVM->RPDA.  Marland Kitchen Hyperlipidemia   . Hypertension   . PVD (peripheral vascular disease) ([place])     a. s/p toe amputations on L foot.  . Bicuspid aortic valve     a. 02/2009 s/p tissue AVR;  b. 07/2014 Echo: EF 55-60%, basal-mid inf HK, mild AS/MR, mod dil LA, PASP 44 mmHg.  Marland Kitchen GERD (gastroesophageal reflux disease)   . Diabetes mellitus     Type II  . Heart murmur     Past Surgical History  Procedure Laterality Date  . Amputation      Left great and second toes 2011  . Amputation      Left great and second toes 2011  . Aortic valve replacement      Pericardial tissue valve  . Coronary artery bypass graft  10  . Amputation  07/27/2011    Procedure: AMPUTATION FOOT;  Surgeon: Newt Minion, MD;  Location: [facility];  Service: Orthopedics;  Laterality: Left;  Left Midfoot Amputation   . Amputation Left 12/08/2014    Procedure: Left Below Knee Amputation;  Surgeon: Newt Minion, MD;  Location: [facility];  Service: Orthopedics;  Laterality: Left;    There were no vitals filed for this visit.  Visit Diagnosis:  Abnormality of  gait  Unsteadiness  Balance problems  Foot drop, right  Decreased functional activity tolerance  Weakness generalized  Status post below knee amputation of left lower extremity ([place])  Encounter for prosthetic gait training  Stiffness due to immobility      Subjective Assessment - 04/04/15 1252    Subjective Pt reports no change in status. Pt had visit with doctor before PT treatment for skin check. Skin WNL on both lower extremities. Pt reports shecking skin on both lower extremities daily. Pt reports walking with cane only when outside the home.   Patient Stated Goals to use prosthesis walk without cane in community.    Currently in Pain? No/denies   Multiple Pain Sites No      Prosthetic Training: To increase pt's functional independence at home and out in the community. Pt supervision to min guard with gait due to intermittent instability.  1. Pt negotiated over 3 obstacles X8 reps with  various heights ranging from 2in to [place]. Verbal cues to lead with prosthesis to decrease risk of falls.  2. Pt completed 4 laps in hallway on R while scanning for objects in various directions. Pt presented with veering to R and L while scanning.       [facility] Adult PT Treatment/Exercise - 04/04/15 0001  Ambulation/Gait   Ambulation/Gait Yes   Ambulation/Gait Assistance 5: Supervision   Ambulation/Gait Assistance Details Pt supervision on indoor, level surfaces. Pt presents with foot slap on R. Pt used straight cane, prosthesis and new foot plate on R for all ambulation.   Ambulation Distance (Feet) 480 Feet  515ft outdoors on unlevel surfaces.   Assistive device Straight cane   Gait Pattern Step-through pattern   Ambulation Surface Level   Stairs Yes   Stairs Assistance 5: Supervision   Stairs Assistance Details (indicate cue type and reason) Verbal cues on cane use for safety   Stair Management Technique Two rails;With cane   Number of Stairs 16   Height of Stairs 6   Ramp --   min guard assist (single point cane, prosthesis and footplat   Ramp Details (indicate cue type and reason) Verbal cues on technique   Curb --  Pt min guard assist due to instability   Curb Details (indicate cue type and reason) Pt instructed to ascend curb with L LE at home until balance is better on R   Prosthetics   Prosthetic Care Comments  Pt reports drainage from wound on distal medial residual limb. PT instructed that use of bandaid to absorb drainage is okay since not over bony area. PT answered patient's question about pads in socket that he is not ready with only wearing single ply sock fit. Pt verbalized understanding.    Current prosthetic wear tolerance (days/week)  daily   Current prosthetic wear tolerance (#hours/day)  4hrs X3 with 2 hrs of break between wear.   Current prosthetic weight-bearing tolerance (hours/day)  Pt tolerates 15-74min without rest break & no c/o limb pain.   Edema none   Residual limb condition  Wound on distal medial limb covered with bandaid.   Education Provided Residual limb care;Skin check  Pt educated to check Skin on R LE daily espeically with new foot plate.   Person(s) Educated Patient   Education Method Explanation   Education Method Verbalized understanding   Donning Prosthesis Supervision   Doffing Prosthesis Supervision                PT Education - 04/04/15 1255    Education provided Yes   Education Details Pt educated on wide, staggard stance with LE weightshifting for vaccuming and mopping at home to increase stability and reduce fall risk.   Person(s) Educated Patient   Methods Explanation;Demonstration   Comprehension Verbalized understanding          PT Short Term Goals - 04/04/15 1302    PT SHORT TERM GOAL #1   Title patient tolerates wear of prosthesis >10 hrs total per day with no new skin issues, current wound healing and no tenderness. (Target Date: 04/15/2015)   Time 1   Period Months   Status New   PT SHORT  TERM GOAL #2   Title Patient verbalizes & demonstrates proper donning, cleaning and skin checking.  (Target Date: 04/15/2015)   Time 1   Period Months   Status New   PT SHORT TERM GOAL #3   Title Berg Balance >/= 20 /56.  (Target Date: 04/15/2015)   Time 1   Period Months   Status New   PT SHORT TERM GOAL #4   Title Patient ambulates 200' with single point cane & prosthesis with supervision. (Target Date: 04/15/2015)   Time 1   Period Months   Status New   PT SHORT TERM GOAL #5   Title Patient  negotiates ramps, curbs & stairs (1 rail) with single point cane & prosthesis with minA. (Target Date: 04/15/2015)   Time 1   Period Months   Status New           PT Long Term Goals - 04/04/15 1302    PT LONG TERM GOAL #1   Title Pateint verbalizes and demonstrates proper prosthetic care to enable safe use of prosthesis. (Target Date: 05/27/2015)   Time 10   Period Weeks   Status New   PT LONG TERM GOAL #2   Title Patient tolerates daily wear of prosthesis >90% of awake hours without skin issues or limb pain to enable potential for function throughout his day. (Target Date: 05/27/2015)   Time 10   Period Weeks   Status New   PT LONG TERM GOAL #3   Title Patient ambulates 100' around furniture with prosthesis only carrying household item modified independent. (Target Date: 05/27/2015)   Time 10   Period Weeks   Status New   PT LONG TERM GOAL #4   Title Patient ambulates 300' with single point cane and prosthesis on outdoor surfaces modified independent. (Target Date: 05/27/2015)   Time 10   Period Weeks   Status New   PT LONG TERM GOAL #5   Title Patient negotiates ramps, curbs and stairs (1 rail) with single point cane and prosthesis modified independent. (Target Date: 05/27/2015)   Time 10   Period Weeks   Status New   PT LONG TERM GOAL #6   Title Berg Balance >/= 22/56 to decrease risk of falls. (Target Date: 05/27/2015)   Time 10   Period Weeks   Status New                Plan - 04/04/15 1256    Clinical Impression Statement Pt is progressing towards LTG's. Pt's gait and balance continue to improve. Pt able to negotiate over obstacles up to 8in tall. Pt presents with veering when scanning for objects in the environment.   Pt will benefit from skilled therapeutic intervention in order to improve on the following deficits Abnormal gait;Decreased activity tolerance;Decreased balance;Decreased endurance;Decreased mobility;Decreased strength;Prosthetic Dependency;Postural dysfunction   Rehab Potential Good   PT Frequency 2x / week   PT Duration Other (comment)  10 weeks   PT Treatment/Interventions ADLs/Self Care Home Management;Functional mobility training;Stair training;Gait training;Therapeutic activities;Therapeutic exercise;DME Instruction;Balance training;Neuromuscular re-education;Patient/family education;Prosthetic Training;Orthotic Fit/Training   PT Next Visit Plan Monitor skin condition. Practice weightshifting with LE while doing household chores. Monitor foot slap on R. Continue practice of scanning objects to increase stability during gait.    Consulted and Agree with Plan of Care Patient        Problem List Patient Active Problem List   Diagnosis Date Noted  . Below knee amputation status ([place]) 12/08/2014  . Bicuspid aortic valve   . Hyperlipidemia   . Hypertension   . Greater trochanter fracture ([place]) 07/28/2014  . Hip fracture, right ([place]) 07/28/2014  . Diabetes mellitus without complication ([place]) 123XX123  . HLD (hyperlipidemia) 07/28/2014  . GERD (gastroesophageal reflux disease) 07/28/2014  . Hip fracture ([place]) 07/28/2014  . Fall 07/28/2014  . Leukocytosis 07/28/2014  . Thrombocytopenia ([place]) 07/28/2014  . H/O aortic valve replacement 10/16/2011  . 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 PLEURAL EFFUSION 05/25/2009  . OPEN WOUND OF CHEST , COMPLICATED 123XX123  . PROSTHETIC VALVE - BIO/ ENDOPROSTHESIS 05/25/2009  . DM  04/19/2009  . HYPERLIPIDEMIA 04/19/2009  . Essential hypertension 04/19/2009  .  CAD (coronary artery disease) 04/19/2009  . Peripheral vascular disease ([place]) 04/19/2009  . OSTEOMYELITIS 04/19/2009  . BICUSPID AORTIC VALVE 04/19/2009    Laney Potash 04/04/2015, 1:04 PM  Laney Potash, [place]  Name: Jimmey Kellum MRN: [id number] Date of Birth: 1955/08/10  Jamey Reas, PT, DPT PT Specializing in [place] 04/04/2015 10:01 PM Phone:  [phone]  Fax:  [phone] [facility] [address] [place], [place] [address]

## 2015-04-06 ENCOUNTER — Encounter: Payer: Self-pay | Admitting: Physical Therapy

## 2015-04-06 ENCOUNTER — Ambulatory Visit: Payer: BLUE CROSS/BLUE SHIELD | Admitting: Physical Therapy

## 2015-04-06 DIAGNOSIS — R2681 Unsteadiness on feet: Secondary | ICD-10-CM

## 2015-04-06 DIAGNOSIS — Z7409 Other reduced mobility: Secondary | ICD-10-CM

## 2015-04-06 DIAGNOSIS — R269 Unspecified abnormalities of gait and mobility: Secondary | ICD-10-CM | POA: Diagnosis not present

## 2015-04-06 DIAGNOSIS — Z4789 Encounter for other orthopedic aftercare: Secondary | ICD-10-CM

## 2015-04-06 DIAGNOSIS — M2560 Stiffness of unspecified joint, not elsewhere classified: Secondary | ICD-10-CM

## 2015-04-06 DIAGNOSIS — Z89512 Acquired absence of left leg below knee: Secondary | ICD-10-CM

## 2015-04-06 DIAGNOSIS — R2689 Other abnormalities of gait and mobility: Secondary | ICD-10-CM

## 2015-04-06 DIAGNOSIS — R6889 Other general symptoms and signs: Secondary | ICD-10-CM

## 2015-04-06 DIAGNOSIS — R531 Weakness: Secondary | ICD-10-CM

## 2015-04-06 DIAGNOSIS — M21371 Foot drop, right foot: Secondary | ICD-10-CM

## 2015-04-06 NOTE — Therapy (Signed)
[facility] [address] [place], [place], [address] Phone: [phone]   Fax:  [phone]  Physical Therapy Treatment  Patient Details  Name: [name] MRN: [id number] Date of Birth: [birth year] Referring Provider: Meridee Score, MD  Encounter Date: 04/06/2015      PT End of Session - 04/06/15 0944    Visit Number 7   Number of Visits 21   Date for PT Re-Evaluation 05/27/15   PT Start Time 0847   PT Stop Time 0930   PT Time Calculation (min) 43 min   Equipment Utilized During Treatment Gait belt   Activity Tolerance Patient tolerated treatment well   Behavior During Therapy [facility] for tasks assessed/performed      Past Medical History  Diagnosis Date  . CAD (coronary artery disease) October 2010    a. 02/2009 NSTEMI/Cath: 3VD; b. 02/2009 CABG x 5: LIMA->LAD, VG->Diag, VG->RI, VG->RVM->RPDA.  Marland Kitchen Hyperlipidemia   . Hypertension   . PVD (peripheral vascular disease) ([place])     a. s/p toe amputations on L foot.  . Bicuspid aortic valve     a. 02/2009 s/p tissue AVR;  b. 07/2014 Echo: EF 55-60%, basal-mid inf HK, mild AS/MR, mod dil LA, PASP 44 mmHg.  Marland Kitchen GERD (gastroesophageal reflux disease)   . Diabetes mellitus     Type II  . Heart murmur     Past Surgical History  Procedure Laterality Date  . Amputation      Left great and second toes 2011  . Amputation      Left great and second toes 2011  . Aortic valve replacement      Pericardial tissue valve  . Coronary artery bypass graft  10  . Amputation  07/27/2011    Procedure: AMPUTATION FOOT;  Surgeon: Newt Minion, MD;  Location: [facility];  Service: Orthopedics;  Laterality: Left;  Left Midfoot Amputation   . Amputation Left 12/08/2014    Procedure: Left Below Knee Amputation;  Surgeon: Newt Minion, MD;  Location: [facility];  Service: Orthopedics;  Laterality: Left;    There were no vitals filed for this visit.  Visit Diagnosis:  Abnormality of  gait  Unsteadiness  Balance problems  Foot drop, right  Decreased functional activity tolerance  Weakness generalized  Status post below knee amputation of left lower extremity ([place])  Encounter for prosthetic gait training  Stiffness due to immobility      Subjective Assessment - 04/06/15 0852    Subjective Pt reports changing bandage on inferior residual limb and adding one inferior to knee cap due to blister.    Patient Stated Goals to use prosthesis walk without cane in community.      Prosthetic Training: To increase pt's functional independence with L below knee prosthesis inside and outside the home.  1. Dry Mopping and Sweeping: Pt completed 62ft X2  in backwards and then forward directions with min guard to min assist due to LOB X3. Verbal and tactile cues for less UE use/trunk movement/flexion and increased LE use by weight shifting back and forth to produce the motion of the tool being used. Verbal cues also for wide staggered stance.  2. Curb and Ramp: Pt completed 5 reps up and down with min guard assist due to intermittent instability. Pt presented with equal stability in LE's with alternate LE's used for ascending and descending.  NMR: To increase pt's balance in double and single leg stance on level and unlevel surfaces to promote  functional independence and reduce risk of falls.  1. Double leg stance on Bosu ball in parallel bars with eyes open: 1, 85min trial. Pt min guard assist with intermittent UE support every 5-10 sec. Mirror used for visual cues to keep Bosu ball level.  2. Toe taps on 6in block: 1 set X8 reps each LE. Pt min guard to min assist due to LOB X4.   3. Corner balance: Pt completed 2 sets of single leg stance on Air X with modified assist with opposite LE on 6in block. 2 sets X30 sec on each leg. Pt with supervision assist with intermittent UE support on walls and chair. Pt also completed 1 min of double leg stance with normal BOS and 1 min with  double leg stance with narrow BOS.        [facility] Adult PT Treatment/Exercise - 04/06/15 0001    Prosthetics   Prosthetic Care Comments  Pt still has proper bandage on healing wound on medial, inferior residual limb to prevent exudate in sleeve. Pt has open blister inferior of patella on residual limb that he has bandaged. PT assessed skin integrity and wound appears to be due to incorrect sock ply. PT applied new bandaid post skin check. Pt educated on use of baby oil to prevent patellar friction as well (see education).   Current prosthetic wear tolerance (days/week)  daily   Current prosthetic wear tolerance (#hours/day)  4hrs X3 with 2 hrs of break between wear.   Current prosthetic weight-bearing tolerance (hours/day)  Pt tolerates 20-46min without rest break.   Edema none   Residual limb condition  WNL on R foot. and 2 stable bandaged wounds on residual limb.   Education Provided Skin check;Residual limb care;Correct ply sock adjustment  Baby oil to reduce friction.   Person(s) Educated Patient   Education Method Explanation   Education Method Verbalized understanding   Donning Prosthesis Supervision   Doffing Prosthesis Supervision           PT Short Term Goals - 04/06/15 0915    PT SHORT TERM GOAL #1   Title patient tolerates wear of prosthesis >10 hrs total per day with no new skin issues, current wound healing and no tenderness. (Target Date: 04/15/2015)   Time 1   Period Months   Status New   PT SHORT TERM GOAL #2   Title Patient verbalizes & demonstrates proper donning, cleaning and skin checking.  (Target Date: 04/15/2015)   Time 1   Period Months   Status New   PT SHORT TERM GOAL #3   Title Berg Balance >/= 20 /56.  (Target Date: 04/15/2015)   Time 1   Period Months   Status New   PT SHORT TERM GOAL #4   Title Patient ambulates 200' with single point cane & prosthesis with supervision. (Target Date: 04/15/2015)   Time 1   Period Months   Status New   PT SHORT  TERM GOAL #5   Title Patient negotiates ramps, curbs & stairs (1 rail) with single point cane & prosthesis with minA. (Target Date: 04/15/2015)   Time 1   Period Months   Status New           PT Long Term Goals - 04/06/15 [id number]    PT LONG TERM GOAL #1   Title Pateint verbalizes and demonstrates proper prosthetic care to enable safe use of prosthesis. (Target Date: 05/27/2015)   Time 10   Period Weeks   Status New   PT  LONG TERM GOAL #2   Title Patient tolerates daily wear of prosthesis >90% of awake hours without skin issues or limb pain to enable potential for function throughout his day. (Target Date: 05/27/2015)   Time 10   Period Weeks   Status New   PT LONG TERM GOAL #3   Title Patient ambulates 100' around furniture with prosthesis only carrying household item modified independent. (Target Date: 05/27/2015)   Time 10   Period Weeks   Status New   PT LONG TERM GOAL #4   Title Patient ambulates 300' with single point cane and prosthesis on outdoor surfaces modified independent. (Target Date: 05/27/2015)   Time 10   Period Weeks   Status New   PT LONG TERM GOAL #5   Title Patient negotiates ramps, curbs and stairs (1 rail) with single point cane and prosthesis modified independent. (Target Date: 05/27/2015)   Time 10   Period Weeks   Status New   PT LONG TERM GOAL #6   Title Berg Balance >/= 22/56 to decrease risk of falls. (Target Date: 05/27/2015)   Time 10   Period Weeks   Status New           Plan - 04/06/15 0934    Clinical Impression Statement It appears that the patellar tendon bulb in Prosthesis was putting excess pressure on skin due to incorrect sock ply and therefore created a new blister. Pt progressing towards goals. Pt continues to require min assist for righting reactions with single limb stance. Pt able to sweep and mop in backwards direction with min guard assist due to LOB X3. The goal is for pt to learn a safe stance with weight shifting between LE's for  efficient use of energy, improved body mechanics and for increased safety with ADL's/housekeeping activities. Pt's pelvis presented more level during gait with slightly more stable gait, however some instability remains especially with barriers and obstacle negotiation.     Pt will benefit from skilled therapeutic intervention in order to improve on the following deficits Abnormal gait;Decreased activity tolerance;Decreased balance;Decreased endurance;Decreased mobility;Decreased strength;Prosthetic Dependency;Postural dysfunction   Rehab Potential Good   PT Frequency 2x / week   PT Duration Other (comment)  10 weeks   PT Treatment/Interventions ADLs/Self Care Home Management;Functional mobility training;Stair training;Gait training;Therapeutic activities;Therapeutic exercise;DME Instruction;Balance training;Neuromuscular re-education;Patient/family education;Prosthetic Training;Orthotic Fit/Training   PT Next Visit Plan Monitor skin condition. Continue to work on static and dynamic balance activities on compliant surfaces to decrease risk of falls.   Consulted and Agree with Plan of Care Patient        Problem List Patient Active Problem List   Diagnosis Date Noted  . Below knee amputation status ([place]) 12/08/2014  . Bicuspid aortic valve   . Hyperlipidemia   . Hypertension   . Greater trochanter fracture ([place]) 07/28/2014  . Hip fracture, right ([place]) 07/28/2014  . Diabetes mellitus without complication ([place]) 123XX123  . HLD (hyperlipidemia) 07/28/2014  . GERD (gastroesophageal reflux disease) 07/28/2014  . Hip fracture ([place]) 07/28/2014  . Fall 07/28/2014  . Leukocytosis 07/28/2014  . Thrombocytopenia ([place]) 07/28/2014  . H/O aortic valve replacement 10/16/2011  . 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 PLEURAL EFFUSION 05/25/2009  . OPEN WOUND OF CHEST , COMPLICATED 123XX123  . PROSTHETIC VALVE - BIO/ ENDOPROSTHESIS 05/25/2009  . DM 04/19/2009  . HYPERLIPIDEMIA 04/19/2009   . Essential hypertension 04/19/2009  . CAD (coronary artery disease) 04/19/2009  . Peripheral vascular disease ([place]) 04/19/2009  . OSTEOMYELITIS 04/19/2009  .  BICUSPID AORTIC VALVE 04/19/2009    Laney Potash 04/06/2015, 1:48 PM  Laney Potash, [place]  Name: Halvor Rilley MRN: [id number] Date of Birth: 04/23/1956  This note has been reviewed and edited by supervising CI.  Willow Ora, PTA, [facility] [address] [place], [place] [address] [phone] 04/07/2015, 3:10 PM

## 2015-04-11 ENCOUNTER — Encounter: Payer: Self-pay | Admitting: Physical Therapy

## 2015-04-11 ENCOUNTER — Ambulatory Visit: Payer: BLUE CROSS/BLUE SHIELD | Attending: Orthopedic Surgery | Admitting: Physical Therapy

## 2015-04-11 DIAGNOSIS — R29818 Other symptoms and signs involving the nervous system: Secondary | ICD-10-CM | POA: Insufficient documentation

## 2015-04-11 DIAGNOSIS — M21371 Foot drop, right foot: Secondary | ICD-10-CM | POA: Diagnosis present

## 2015-04-11 DIAGNOSIS — Z5189 Encounter for other specified aftercare: Secondary | ICD-10-CM | POA: Diagnosis present

## 2015-04-11 DIAGNOSIS — R531 Weakness: Secondary | ICD-10-CM | POA: Diagnosis present

## 2015-04-11 DIAGNOSIS — Z7409 Other reduced mobility: Secondary | ICD-10-CM

## 2015-04-11 DIAGNOSIS — R6889 Other general symptoms and signs: Secondary | ICD-10-CM | POA: Diagnosis present

## 2015-04-11 DIAGNOSIS — R269 Unspecified abnormalities of gait and mobility: Secondary | ICD-10-CM

## 2015-04-11 DIAGNOSIS — R2689 Other abnormalities of gait and mobility: Secondary | ICD-10-CM

## 2015-04-11 DIAGNOSIS — M2560 Stiffness of unspecified joint, not elsewhere classified: Secondary | ICD-10-CM

## 2015-04-11 DIAGNOSIS — R2681 Unsteadiness on feet: Secondary | ICD-10-CM

## 2015-04-11 DIAGNOSIS — M623 Immobility syndrome (paraplegic): Secondary | ICD-10-CM | POA: Diagnosis present

## 2015-04-11 DIAGNOSIS — Z4789 Encounter for other orthopedic aftercare: Secondary | ICD-10-CM

## 2015-04-11 DIAGNOSIS — Z89512 Acquired absence of left leg below knee: Secondary | ICD-10-CM

## 2015-04-11 NOTE — Therapy (Addendum)
[facility] [address] [place], [place], [address] Phone: [phone]   Fax:  [phone]  Physical Therapy Treatment  Patient Details  Name: [name] MRN: [id number] Date of Birth: 06-23-1955 Referring Provider: Meridee Score, MD  Encounter Date: 04/11/2015      PT End of Session - 04/11/15 1127    Visit Number 8   Number of Visits 21   Date for PT Re-Evaluation 05/27/15   PT Start Time 0845   PT Stop Time 0930   PT Time Calculation (min) 45 min   Equipment Utilized During Treatment Gait belt   Activity Tolerance Patient tolerated treatment well   Behavior During Therapy [facility] for tasks assessed/performed      Past Medical History  Diagnosis Date  . CAD (coronary artery disease) October 2010    a. 02/2009 NSTEMI/Cath: 3VD; b. 02/2009 CABG x 5: LIMA->LAD, VG->Diag, VG->RI, VG->RVM->RPDA.  Marland Kitchen Hyperlipidemia   . Hypertension   . PVD (peripheral vascular disease) ([place])     a. s/p toe amputations on L foot.  . Bicuspid aortic valve     a. 02/2009 s/p tissue AVR;  b. 07/2014 Echo: EF 55-60%, basal-mid inf HK, mild AS/MR, mod dil LA, PASP 44 mmHg.  Marland Kitchen GERD (gastroesophageal reflux disease)   . Diabetes mellitus     Type II  . Heart murmur     Past Surgical History  Procedure Laterality Date  . Amputation      Left great and second toes 2011  . Amputation      Left great and second toes 2011  . Aortic valve replacement      Pericardial tissue valve  . Coronary artery bypass graft  10  . Amputation  07/27/2011    Procedure: AMPUTATION FOOT;  Surgeon: Newt Minion, MD;  Location: [facility];  Service: Orthopedics;  Laterality: Left;  Left Midfoot Amputation   . Amputation Left 12/08/2014    Procedure: Left Below Knee Amputation;  Surgeon: Newt Minion, MD;  Location: [facility];  Service: Orthopedics;  Laterality: Left;    There were no vitals filed for this visit.  Visit Diagnosis:  Abnormality of  gait  Balance problems  Unsteadiness  Foot drop, right  Decreased functional activity tolerance  Weakness generalized  Status post below knee amputation of left lower extremity ([place])  Encounter for prosthetic gait training  Stiffness due to immobility      Subjective Assessment - 04/11/15 0849    Subjective Pt reports falling this morning on his way in the clinic doors. Pt reports that someone assisted him in recovery (getting up). Pt presents with skin abrasion to right forearm, covered this with a band aide.Denies any additional injuries. Was carrying his cane, not using it per his report. Primary PT notified and assessed pt post fall.  Pt presented with foot drop on R when treatment began and it appears that this mornings fall could be a result of poor foot clearance with gait.    Patient Stated Goals to use prosthesis walk without cane in community.    Currently in Pain? No/denies   Multiple Pain Sites No     Prosthetics Training: To maximize pt's functional independence in the home and out in the community.  1. Pt ambulated 283f with supervision assist with verbal cues for foot slap on R and adequate weight shift L. Pt carried cane vs using it with gait. Pt with mild unsteadiness, veering at times. Pt educated that  use of the cane would assist his balance and decrease his chance of falling.  2. Ramp: Pt min guard assist due to instability upon ascent and descent cues on sequencing and technique. Cues to use cane for safety and stability vs carrying it.  3. Curb: Pt min guard assist with ascent and descent due to slight instability. Cues on technique and foot position. Cues to use cane for safety and stability.   4. Stairs: Pt supervision with 4, 6in steps with use of R hand rail. Pt carried cane in hand. Pt used reciprocal pattern.   NMR: To increase pt's balance in order to reduce risk of falling.  1. Corner balance on airX: Pt completed 77mn eyes open in normal stance.  167m, eyes closed normal stance, 1 min eys open with all direction head turns, 68m29min narrow BOS with eyes open and 68mi9mn narrow BOS eyes closed.  2. Double leg stance on blue foam beam crossways to train hip strategies: Pt completed 2 min in normal width stance with supervision assist with minimal intermittent UE support.        [facility] PT Treatment/Exercise - 04/11/15 0859    Berg Balance Test   Sit to Stand Able to stand  independently using hands   Standing Unsupported Able to stand 2 minutes with supervision   Sitting with Back Unsupported but Feet Supported on Floor or Stool Able to sit safely and securely 2 minutes   Stand to Sit Controls descent by using hands   Transfers Able to transfer safely, definite need of hands   Standing Unsupported with Eyes Closed Able to stand 10 seconds safely   Standing Ubsupported with Feet Together Able to place feet together independently and stand for 1 minute with supervision   From Standing, Reach Forward with Outstretched Arm Can reach forward >12 cm safely (5")   From Standing Position, Pick up Object from FlooWagonerpick up shoe, needs supervision   From Standing Position, Turn to Look Behind Over each Shoulder Looks behind from both sides and weight shifts well   Turn 360 Degrees Able to turn 360 degrees safely but slowly   Standing Unsupported, Alternately Place Feet on Step/Stool Able to complete >2 steps/needs minimal assist   Standing Unsupported, One Foot in Front Needs help to step but can hold 15 seconds   Standing on One Leg Tries to lift leg/unable to hold 3 seconds but remains standing independently   Total Score 38   Prosthetics   Prosthetic Care Comments  The 2 existing wounds on residual limb are healing WNL. Pt c/o excess preasure on the lateral residual limb. It appears the pt had inadequate sock ply. Pt educated that excess pressure is usually due to incoorect sock ply. Pt added 1 ply to 2 ply during treatment. Pt  still feeling pressure at end of treatment so SPTA advised trying 3 ply sock.    Current prosthetic wear tolerance (days/week)  daily   Current prosthetic wear tolerance (#hours/day)  4hrs X3 with 2 hrs of break between wear.   Current prosthetic weight-bearing tolerance (hours/day)  Pt tolerates 25-30 min without rest break.   Edema none   Residual limb condition  PT assessed existing 2 wounds and assessed them to be healing WNL. Bandage was removed from wound on medial, inferior residual limb.    Education Provided Skin check;Residual limb care;Correct ply sock adjustment   Person(s) Educated Patient   Education Method Explanation   Education Method Verbalized  understanding   Donning Prosthesis Supervision   Doffing Prosthesis Supervision          PT Education - 04/11/15 1125    Education provided Yes   Education Details Pt educated that incorrect sock ply adjustment can lead to pain and pressure at the sight of pressure and then could possibly lead to skin breakdown.   Person(s) Educated Patient   Methods Explanation   Comprehension Verbalized understanding          PT Short Term Goals - 04/11/15 1129    PT SHORT TERM GOAL #1   Title patient tolerates wear of prosthesis >10 hrs total per day with no new skin issues, current wound healing and no tenderness. (Target Date: 04/15/2015)   Baseline Assessed: 04/11/15. Pt is wearing prosthesis 12 hrs per day, but does have some skin continuing to heal and reported excess pressure today from incorrect sock ply.   Time 1   Period Months   Status Partially Met   PT SHORT TERM GOAL #2   Title Patient verbalizes & demonstrates proper donning, cleaning and skin checking.  (Target Date: 04/15/2015)   Baseline Achieved: 04/11/15   Time 1   Period Months   Status Achieved   PT SHORT TERM GOAL #3   Title Berg Balance >/= 20 /56.  (Target Date: 04/15/2015)   Baseline Achieved: 04/11/15. Pt scored 38/56 on BERG.   Time 1   Period Months    Status Achieved   PT SHORT TERM GOAL #4   Title Patient ambulates 200' with single point cane & prosthesis with supervision. (Target Date: 04/15/2015)   Baseline Achieved: 04/11/15. Pt supervision due to foot slap on R and general instability.   Time 1   Period Months   Status Achieved   PT SHORT TERM GOAL #5   Title Patient negotiates ramps, curbs & stairs (1 rail) with single point cane & prosthesis with minA. (Target Date: 04/15/2015)   Baseline Achieved: 04/11/15. Pt min guard assist with curb due to general instability with ascent. Pt min guard with ramp. Pt supervision with 4 steps in reciprocal pattern.   Time 1   Period Months   Status Achieved           PT Long Term Goals - 04/11/15 1136    PT LONG TERM GOAL #1   Title Pateint verbalizes and demonstrates proper prosthetic care to enable safe use of prosthesis. (Target Date: 05/27/2015)   Time 10   Period Weeks   Status New   PT LONG TERM GOAL #2   Title Patient tolerates daily wear of prosthesis >90% of awake hours without skin issues or limb pain to enable potential for function throughout his day. (Target Date: 05/27/2015)   Time 10   Period Weeks   Status New   PT LONG TERM GOAL #3   Title Patient ambulates 100' around furniture with prosthesis only carrying household item modified independent. (Target Date: 05/27/2015)   Time 10   Period Weeks   Status New   PT LONG TERM GOAL #4   Title Patient ambulates 300' with single point cane and prosthesis on outdoor surfaces modified independent. (Target Date: 05/27/2015)   Time 10   Period Weeks   Status New   PT LONG TERM GOAL #5   Title Patient negotiates ramps, curbs and stairs (1 rail) with single point cane and prosthesis modified independent. (Target Date: 05/27/2015)   Time 10   Period Weeks   Status New  PT LONG TERM GOAL #6   Title Berg Balance >/= 22/56 to decrease risk of falls. (Target Date: 05/27/2015)   Time 10   Period Weeks   Status New                Plan - 04/11/15 1138    Clinical Impression Statement Pt with 1cm open wound on R posterior, proximal forearm (wound was bandaged by PTA). Pt did not report any pain from fall. Pt was assessed by PT and cleared for treatment today post fall. Pt has achieved STG's 2-5 and has partially met STG 1 (see STG's) .  Pt presents with decreased stability. Pt with foot drop on R. It appears the foot drop could be eliminated by complete AFO. Pt's balance is improving per Merrilee Jansky, but pt presents with min guard assist in static and dynamic balance activities.    Pt will benefit from skilled therapeutic intervention in order to improve on the following deficits Abnormal gait;Decreased activity tolerance;Decreased balance;Decreased endurance;Decreased mobility;Decreased strength;Prosthetic Dependency;Postural dysfunction   Rehab Potential Good   PT Frequency 2x / week   PT Duration Other (comment)  10 weeks   PT Treatment/Interventions ADLs/Self Care Home Management;Functional mobility training;Stair training;Gait training;Therapeutic activities;Therapeutic exercise;DME Instruction;Balance training;Neuromuscular re-education;Patient/family education;Prosthetic Training;Orthotic Fit/Training   PT Next Visit Plan Monitor skin integrity. Focus on increasing static and dynamic balance. Add corner balance to HEP.   Consulted and Agree with Plan of Care Patient        Problem List Patient Active Problem List   Diagnosis Date Noted  . Below knee amputation status ([place]) 12/08/2014  . Bicuspid aortic valve   . Hyperlipidemia   . Hypertension   . Greater trochanter fracture ([place]) 07/28/2014  . Hip fracture, right ([place]) 07/28/2014  . Diabetes mellitus without complication ([place]) 57/97/2820  . HLD (hyperlipidemia) 07/28/2014  . GERD (gastroesophageal reflux disease) 07/28/2014  . Hip fracture ([place]) 07/28/2014  . Fall 07/28/2014  . Leukocytosis 07/28/2014  . Thrombocytopenia ([place]) 07/28/2014  . H/O aortic valve  replacement 10/16/2011  . 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 PLEURAL EFFUSION 05/25/2009  . OPEN WOUND OF CHEST , COMPLICATED 60/15/6153  . PROSTHETIC VALVE - BIO/ ENDOPROSTHESIS 05/25/2009  . DM 04/19/2009  . HYPERLIPIDEMIA 04/19/2009  . Essential hypertension 04/19/2009  . CAD (coronary artery disease) 04/19/2009  . Peripheral vascular disease ([place]) 04/19/2009  . OSTEOMYELITIS 04/19/2009  . BICUSPID AORTIC VALVE 04/19/2009    Laney Potash 04/11/2015, 11:58 AM  Laney Potash, SPTA  Name: [name] MRN: [id number] Date of Birth: February 19, 1956  This note has been reviewed and edited by supervising CI.  Willow Ora, PTA, [facility] [address], [place], [place] [address] [phone] 04/11/2015, 1:10 PM   PT made aware of fall with abrasion to his right elbow. PT assessed patient and appears to have no significant injury and safe to continue with PT plan of care. Jamey Reas, PT, DPT PT Specializing in [place] 04/11/2015 1:41 PM Phone:  [phone]  Fax:  [phone] [facility] [address] [place], [place] [address]

## 2015-04-13 ENCOUNTER — Encounter: Payer: Self-pay | Admitting: Physical Therapy

## 2015-04-13 ENCOUNTER — Ambulatory Visit: Payer: BLUE CROSS/BLUE SHIELD | Admitting: Physical Therapy

## 2015-04-13 DIAGNOSIS — R6889 Other general symptoms and signs: Secondary | ICD-10-CM

## 2015-04-13 DIAGNOSIS — Z4789 Encounter for other orthopedic aftercare: Secondary | ICD-10-CM

## 2015-04-13 DIAGNOSIS — Z7409 Other reduced mobility: Secondary | ICD-10-CM

## 2015-04-13 DIAGNOSIS — M21371 Foot drop, right foot: Secondary | ICD-10-CM

## 2015-04-13 DIAGNOSIS — R531 Weakness: Secondary | ICD-10-CM

## 2015-04-13 DIAGNOSIS — M2560 Stiffness of unspecified joint, not elsewhere classified: Secondary | ICD-10-CM

## 2015-04-13 DIAGNOSIS — R269 Unspecified abnormalities of gait and mobility: Secondary | ICD-10-CM

## 2015-04-13 DIAGNOSIS — Z89512 Acquired absence of left leg below knee: Secondary | ICD-10-CM

## 2015-04-13 DIAGNOSIS — R2689 Other abnormalities of gait and mobility: Secondary | ICD-10-CM

## 2015-04-13 DIAGNOSIS — R2681 Unsteadiness on feet: Secondary | ICD-10-CM

## 2015-04-13 NOTE — Therapy (Signed)
[facility] [address] [place], [place], [address] Phone: [phone]   Fax:  [phone]  Physical Therapy Treatment  Patient Details  Name: [name] MRN: [id number] Date of Birth: [birth year] Referring Provider: Meridee Score, MD  Encounter Date: 04/13/2015      PT End of Session - 04/13/15 0849    Visit Number 9   Number of Visits 21   Date for PT Re-Evaluation 05/27/15   PT Start Time 0844   PT Stop Time 0930   PT Time Calculation (min) 46 min   Equipment Utilized During Treatment Gait belt   Activity Tolerance Patient tolerated treatment well   Behavior During Therapy [facility] for tasks assessed/performed      Past Medical History  Diagnosis Date  . CAD (coronary artery disease) October 2010    a. 02/2009 NSTEMI/Cath: 3VD; b. 02/2009 CABG x 5: LIMA->LAD, VG->Diag, VG->RI, VG->RVM->RPDA.  Marland Kitchen Hyperlipidemia   . Hypertension   . PVD (peripheral vascular disease) ([place])     a. s/p toe amputations on L foot.  . Bicuspid aortic valve     a. 02/2009 s/p tissue AVR;  b. 07/2014 Echo: EF 55-60%, basal-mid inf HK, mild AS/MR, mod dil LA, PASP 44 mmHg.  Marland Kitchen GERD (gastroesophageal reflux disease)   . Diabetes mellitus     Type II  . Heart murmur     Past Surgical History  Procedure Laterality Date  . Amputation      Left great and second toes 2011  . Amputation      Left great and second toes 2011  . Aortic valve replacement      Pericardial tissue valve  . Coronary artery bypass graft  10  . Amputation  07/27/2011    Procedure: AMPUTATION FOOT;  Surgeon: Newt Minion, MD;  Location: [facility];  Service: Orthopedics;  Laterality: Left;  Left Midfoot Amputation   . Amputation Left 12/08/2014    Procedure: Left Below Knee Amputation;  Surgeon: Newt Minion, MD;  Location: [facility];  Service: Orthopedics;  Laterality: Left;    There were no vitals filed for this visit.  Visit Diagnosis:  Abnormality of  gait  Balance problems  Unsteadiness  Foot drop, right  Decreased functional activity tolerance  Weakness generalized  Status post below knee amputation of left lower extremity ([place])  Encounter for prosthetic gait training  Stiffness due to immobility      Subjective Assessment - 04/13/15 1226    Subjective Pt reports no falls or changes in status. Pt does report 2/10 pain in distal residual limb that appears to be due to insufficient sock ply. Pt reports that as long as he uses an AD that he could at least walk for 1 hour without fatigue.   Patient Stated Goals to use prosthesis walk without cane in community.    Currently in Pain? Yes   Pain Score 2    Pain Location Leg   Pain Orientation Left   Pain Descriptors / Indicators Nagging;Pressure;Aching   Pain Type Acute pain   Pain Onset In the past 7 days   Aggravating Factors  insufficient sock ply   Pain Relieving Factors correct sock ply     NMR: To increase pt's stability with gait during environmental challenges.  1. Scanning in hallway while attempting to maintain a straight path. Pt supervision to min guard assist with all scanning activities due to instability.   A. Pt ambulated 2 laps with horizontal head  turns.  Pt with veering X2.  B. Pt ambulated 2 laps with vertical head turns. Pt with veering X4.  C. Pt ambualted 4 laps with diagonal head turns. Pt with veering X6 with LOB X2. Pt min guard assist for righting balance.  2. Corner balance:   A. Pt performed 46mn X3 on floor surface, narrow BOS, eyes closed and horizontal, vertical and diagonal head turns. Pt intermittently used UE's to right balance.   B. Pt performed static stance 151m X3 on compliant surface, normal stance, eyes closed without head turns. Pt intermittently used UE's to right balance.   3. Step ups, step downs and step overs on AirX in parallel bars: Pt completed 15 reps per LE per exercise with constant UE support and supervision assist. Pt  demonstrated adequate foot clearance to increase safety during gait.  4. Double leg stance on BOSU ball in parallel bars. Pt with significant instability. Use of B UE's for righting balance 50% of activity duration. Pt min guard assist due to significant instability.    5. Ambulating with eyes closed to simulate ambulation with insufficient lighting. Pt ambulated along long edge of track for 4 laps. Pt required mod cues to maintain eyes closed. Pt min guard assist for safety. Pt presented with veering with intermittent use of vision.      [facility]/Exercise - 04/13/15 0001    Prosthetics   Prosthetic Care Comments  Pt min assist with cues for proper sock adjustment. Pt allowed to feel the difference between pressure of insufficient ply, excess ply and proper ply.  Pt educated on signs of improper sock ply.    Current prosthetic wear tolerance (days/week)  daily   Current prosthetic wear tolerance (#hours/day)  4hrs X3 with 2 hrs of break between wear.   Current prosthetic weight-bearing tolerance (hours/day)  Pt tolerates 25-30 min without rest break.   Edema none   Residual limb condition  skin WNL   Education Provided Skin check;Correct ply sock adjustment   Person(s) Educated Patient   Education Method Explanation   Education Method Verbalized understanding   Donning Prosthesis Supervision                PT Education - 04/13/15 0848    Education provided Yes   Education Details Pt educated on rationale and benefits of proper sock ply adjustement to prevent pain and skin breakdown.   Person(s) Educated Patient   Methods Explanation;Demonstration   Comprehension Verbalized understanding;Returned demonstration          PT Short Term Goals - 04/13/15 1215    PT SHORT TERM GOAL #1   Title patient tolerates wear of prosthesis >10 hrs total per day with no new skin issues, current wound healing and no tenderness. (Target Date: 04/15/2015)   Baseline Assessed:  04/11/15. Pt is wearing prosthesis 12 hrs per day, but does have some skin continuing to heal and reported excess pressure today from incorrect sock ply.   Time 1   Period Months   Status Partially Met   PT SHORT TERM GOAL #2   Title Patient verbalizes & demonstrates proper donning, cleaning and skin checking.  (Target Date: 04/15/2015)   Baseline Achieved: 04/11/15   Time 1   Period Months   Status Achieved   PT SHORT TERM GOAL #3   Title Berg Balance >/= 20 /56.  (Target Date: 04/15/2015)   Baseline Achieved: 04/11/15. Pt scored 38/56 on BERG.   Time 1   Period Months  Status Achieved   PT SHORT TERM GOAL #4   Title Patient ambulates 200' with single point cane & prosthesis with supervision. (Target Date: 04/15/2015)   Baseline Achieved: 04/11/15. Pt supervision due to foot slap on R and general instability.   Time 1   Period Months   Status Achieved   PT SHORT TERM GOAL #5   Title Patient negotiates ramps, curbs & stairs (1 rail) with single point cane & prosthesis with minA. (Target Date: 04/15/2015)   Baseline Achieved: 04/11/15. Pt min guard assist with curb due to general instability with ascent. Pt min guard with ramp. Pt supervision with 4 steps in reciprocal pattern.   Time 1   Period Months   Status Achieved           PT Long Term Goals - 04/13/15 1216    PT LONG TERM GOAL #1   Title Pateint verbalizes and demonstrates proper prosthetic care to enable safe use of prosthesis. (Target Date: 05/27/2015)   Time 10   Period Weeks   Status New   PT LONG TERM GOAL #2   Title Patient tolerates daily wear of prosthesis >90% of awake hours without skin issues or limb pain to enable potential for function throughout his day. (Target Date: 05/27/2015)   Time 10   Period Weeks   Status New   PT LONG TERM GOAL #3   Title Patient ambulates 100' around furniture with prosthesis only carrying household item modified independent. (Target Date: 05/27/2015)   Time 10   Period Weeks    Status New   PT LONG TERM GOAL #4   Title Patient ambulates 300' with single point cane and prosthesis on outdoor surfaces modified independent. (Target Date: 05/27/2015)   Time 10   Period Weeks   Status New   PT LONG TERM GOAL #5   Title Patient negotiates ramps, curbs and stairs (1 rail) with single point cane and prosthesis modified independent. (Target Date: 05/27/2015)   Time 10   Period Weeks   Status New   PT LONG TERM GOAL #6   Title Berg Balance >/= 22/56 to decrease risk of falls. (Target Date: 05/27/2015)   Time 10   Period Weeks   Status New               Plan - 04/13/15 1217    Clinical Impression Statement Skilled treatment session focused on education of proper sock ply adjustement, updating pt's HEP with corner balance activities, and competency with ambualting with transition from one surface to another. Pt presented with sufficient foot clearance on B LE's. Pt 's limiting factor appears to be lack of balance. Pt unable to maintain straight path in hallway with multi-directional scanning. Pt now verbalizes understanding  of proper sock ply adjustment.    Pt will benefit from skilled therapeutic intervention in order to improve on the following deficits Abnormal gait;Decreased activity tolerance;Decreased balance;Decreased endurance;Decreased mobility;Decreased strength;Prosthetic Dependency;Postural dysfunction   Rehab Potential Good   PT Frequency 2x / week   PT Duration Other (comment)  10 weeks   PT Treatment/Interventions ADLs/Self Care Home Management;Functional mobility training;Stair training;Gait training;Therapeutic activities;Therapeutic exercise;DME Instruction;Balance training;Neuromuscular re-education;Patient/family education;Prosthetic Training;Orthotic Fit/Training   PT Next Visit Plan Note if pt is wearing sufficient sock ply. Monitor skin integrity. Focus on increasing static and dynamic balance. Continue scanning activities in hallway and ambualtion  surface transitions.    Consulted and Agree with Plan of Care Patient        Problem List Patient  Active Problem List   Diagnosis Date Noted  . Below knee amputation status ([place]) 12/08/2014  . Bicuspid aortic valve   . Hyperlipidemia   . Hypertension   . Greater trochanter fracture ([place]) 07/28/2014  . Hip fracture, right ([place]) 07/28/2014  . Diabetes mellitus without complication ([place]) 22/06/6689  . HLD (hyperlipidemia) 07/28/2014  . GERD (gastroesophageal reflux disease) 07/28/2014  . Hip fracture ([place]) 07/28/2014  . Fall 07/28/2014  . Leukocytosis 07/28/2014  . Thrombocytopenia ([place]) 07/28/2014  . H/O aortic valve replacement 10/16/2011  . 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 PLEURAL EFFUSION 05/25/2009  . OPEN WOUND OF CHEST , COMPLICATED 67/56/1254  . PROSTHETIC VALVE - BIO/ ENDOPROSTHESIS 05/25/2009  . DM 04/19/2009  . HYPERLIPIDEMIA 04/19/2009  . Essential hypertension 04/19/2009  . CAD (coronary artery disease) 04/19/2009  . Peripheral vascular disease ([place]) 04/19/2009  . OSTEOMYELITIS 04/19/2009  . BICUSPID AORTIC VALVE 04/19/2009    Laney Potash 04/13/2015, 12:37 PM  Laney Potash, [place]  Name: [name] MRN: [id number] Date of Birth: 05-Oct-1955   Jamey Reas, PT, DPT PT Specializing in [place] 04/13/2015 1:46 PM Phone:  [phone]  Fax:  [phone] [facility] [address] [place],  [address]

## 2015-04-18 ENCOUNTER — Encounter: Payer: Self-pay | Admitting: Physical Therapy

## 2015-04-18 ENCOUNTER — Ambulatory Visit: Payer: BLUE CROSS/BLUE SHIELD | Admitting: Physical Therapy

## 2015-04-18 DIAGNOSIS — R2681 Unsteadiness on feet: Secondary | ICD-10-CM

## 2015-04-18 DIAGNOSIS — R269 Unspecified abnormalities of gait and mobility: Secondary | ICD-10-CM | POA: Diagnosis not present

## 2015-04-18 DIAGNOSIS — R2689 Other abnormalities of gait and mobility: Secondary | ICD-10-CM

## 2015-04-18 DIAGNOSIS — Z89512 Acquired absence of left leg below knee: Secondary | ICD-10-CM

## 2015-04-18 DIAGNOSIS — Z4789 Encounter for other orthopedic aftercare: Secondary | ICD-10-CM

## 2015-04-18 DIAGNOSIS — R531 Weakness: Secondary | ICD-10-CM

## 2015-04-18 DIAGNOSIS — R6889 Other general symptoms and signs: Secondary | ICD-10-CM

## 2015-04-19 NOTE — Therapy (Signed)
[facility] [address] [place], [place], [address] Phone: [phone]   Fax:  [phone]  Physical Therapy Treatment  Patient Details  Name: [name] MRN: [id number] Date of Birth: Apr 18, 1956 Referring Provider: Meridee Score, MD  Encounter Date: 04/18/2015      PT End of Session - 04/18/15 1015    Visit Number 10   Number of Visits 21   Date for PT Re-Evaluation 05/27/15   PT Start Time 0930   PT Stop Time 1012   PT Time Calculation (min) 42 min   Equipment Utilized During Treatment Gait belt   Activity Tolerance Patient tolerated treatment well   Behavior During Therapy [facility] for tasks assessed/performed      Past Medical History  Diagnosis Date  . CAD (coronary artery disease) October 2010    a. 02/2009 NSTEMI/Cath: 3VD; b. 02/2009 CABG x 5: LIMA->LAD, VG->Diag, VG->RI, VG->RVM->RPDA.  Marland Kitchen Hyperlipidemia   . Hypertension   . PVD (peripheral vascular disease) ([place])     a. s/p toe amputations on L foot.  . Bicuspid aortic valve     a. 02/2009 s/p tissue AVR;  b. 07/2014 Echo: EF 55-60%, basal-mid inf HK, mild AS/MR, mod dil LA, PASP 44 mmHg.  Marland Kitchen GERD (gastroesophageal reflux disease)   . Diabetes mellitus     Type II  . Heart murmur     Past Surgical History  Procedure Laterality Date  . Amputation      Left great and second toes 2011  . Amputation      Left great and second toes 2011  . Aortic valve replacement      Pericardial tissue valve  . Coronary artery bypass graft  10  . Amputation  07/27/2011    Procedure: AMPUTATION FOOT;  Surgeon: Newt Minion, MD;  Location: [facility];  Service: Orthopedics;  Laterality: Left;  Left Midfoot Amputation   . Amputation Left 12/08/2014    Procedure: Left Below Knee Amputation;  Surgeon: Newt Minion, MD;  Location: [place];  Service: Orthopedics;  Laterality: Left;    There were no vitals filed for this visit.  Visit Diagnosis:  Abnormality of  gait  Balance problems  Unsteadiness  Decreased functional activity tolerance  Weakness generalized  Status post below knee amputation of left lower extremity ([place])  Encounter for prosthetic gait training      Subjective Assessment - 04/18/15 0930    Subjective He reports no falls. He is going to prosthetist right after PT today to have pads added that PT recommended.   Currently in Pain? No/denies                         [facility] Adult PT Treatment/Exercise - 04/18/15 0930    Ambulation/Gait   Ambulation/Gait Yes   Ambulation/Gait Assistance 5: Supervision   Ambulation/Gait Assistance Details PT worked on negotiating obstacles, scanning, changing surfaces,    Ambulation Distance (Feet) 800 Feet  800' X 2   Assistive device Prosthesis;None;Straight cane  prosthesis only household & cane community activities   Gait Pattern Step-through pattern;Decreased stance time - left;Decreased dorsiflexion - right;Trunk flexed;Abducted - left   Ambulation Surface Indoor;Level;Outdoor;Unlevel;Paved   Stairs Yes   Stairs Assistance 5: Supervision   Stairs Assistance Details (indicate cue type and reason) foot placement on step   Stair Management Technique Two rails;One rail Left;One rail Right   Number of Stairs 4  4 reps   Door Management  6: Modified independent (Device/Increase time)   Ramp 5: Supervision  prosthesis & cane   Ramp Details (indicate cue type and reason) cues on step length & wt shift   Curb 5: Supervision  cane & prosthesis   Curb Details (indicate cue type and reason) step length to facilitate wt transfer   High Level Balance   High Level Balance Activities Side stepping;Backward walking;Turns;Sudden stops;Head turns;Figure 8 turns;Negotitating around obstacles;Negotiating over obstacles   High Level Balance Comments PT demo & instructed in technique for each activity and tactile cues during    Self-Care   Self-Care Lifting   Lifting PT demo,  instructed proper technique with TTA prosthesis. Pt picked up cane from floor and 15# box with cues   Therapeutic Activites    Therapeutic Activities ADL's   ADL's Pt requested help for using prosthesis on clutch in his truck. PT instructed in supine stretch for left quad / knee flexion and knee to chest stretch. Supine hip/knee to chest and seated hip/knee flexion simulating moving prostheses on/off clutch and leg press to depress clutch.  Pt verbalized & demonstrated understanding of all 4 exercise   Prosthetics   Prosthetic Care Comments  PT instructed in signs of sweating with need to dry limb & liner.   Current prosthetic wear tolerance (days/week)  daily   Current prosthetic wear tolerance (#hours/day)  4hrs X3 with 2 hrs of break between wear. PT instructed to increase to 6 hrs 2x/day   Edema none   Residual limb condition  no open areas.   Education Provided Residual limb care;Correct ply sock adjustment;Proper wear schedule/adjustment   Person(s) Educated Patient   Education Method Explanation;Verbal cues   Education Method Verbalized understanding;Verbal cues required   Donning Prosthesis Modified independent (device/increased time)                  PT Short Term Goals - 04/13/15 1215    PT SHORT TERM GOAL #1   Title patient tolerates wear of prosthesis >10 hrs total per day with no new skin issues, current wound healing and no tenderness. (Target Date: 04/15/2015)   Baseline Assessed: 04/11/15. Pt is wearing prosthesis 12 hrs per day, but does have some skin continuing to heal and reported excess pressure today from incorrect sock ply.   Time 1   Period Months   Status Partially Met   PT SHORT TERM GOAL #2   Title Patient verbalizes & demonstrates proper donning, cleaning and skin checking.  (Target Date: 04/15/2015)   Baseline Achieved: 04/11/15   Time 1   Period Months   Status Achieved   PT SHORT TERM GOAL #3   Title Berg Balance >/= 20 /56.  (Target Date:  04/15/2015)   Baseline Achieved: 04/11/15. Pt scored 38/56 on BERG.   Time 1   Period Months   Status Achieved   PT SHORT TERM GOAL #4   Title Patient ambulates 200' with single point cane & prosthesis with supervision. (Target Date: 04/15/2015)   Baseline Achieved: 04/11/15. Pt supervision due to foot slap on R and general instability.   Time 1   Period Months   Status Achieved   PT SHORT TERM GOAL #5   Title Patient negotiates ramps, curbs & stairs (1 rail) with single point cane & prosthesis with minA. (Target Date: 04/15/2015)   Baseline Achieved: 04/11/15. Pt min guard assist with curb due to general instability with ascent. Pt min guard with ramp. Pt supervision with 4 steps in reciprocal pattern.  Time 1   Period Months   Status Achieved           PT Long Term Goals - 04/18/15 1015    PT LONG TERM GOAL #1   Title Pateint verbalizes and demonstrates proper prosthetic care to enable safe use of prosthesis. (Target Date: 05/12/2015)   Time 10   Period Weeks   Status On-going   PT LONG TERM GOAL #2   Title Patient tolerates daily wear of prosthesis >90% of awake hours without skin issues or limb pain to enable potential for function throughout his day.  (Target Date: 05/12/2015)   Time 10   Period Weeks   Status On-going   PT LONG TERM GOAL #3   Title Patient ambulates 100' around furniture with prosthesis only carrying household item modified independent.  (Target Date: 05/12/2015)   Time 10   Period Weeks   Status On-going   PT LONG TERM GOAL #4   Title Patient ambulates 1000' with single point cane and prosthesis on outdoor surfaces modified independent.  (Target Date: 05/12/2015)   Time 10   Period Weeks   Status Revised   PT LONG TERM GOAL #5   Title Patient negotiates ramps, curbs and stairs (1 rail) with single point cane and prosthesis modified independent.  (Target Date: 05/12/2015)   Time 10   Period Weeks   Status On-going   PT LONG TERM GOAL #6   Title Berg Balance  >/= 36/56 to decrease risk of falls. (Target Date: 05/12/2015)   Time 10   Period Months               Plan - 04/18/15 1015    Clinical Impression Statement Patient improved safety with stepping over & around obstacles with instruction & repetition. Patient continues to use plate under partial foot prosthesis to aid balance with adequate foot clearance in gait. PT udpated LTGs for higher level mobility.     Pt will benefit from skilled therapeutic intervention in order to improve on the following deficits Abnormal gait;Decreased activity tolerance;Decreased balance;Decreased endurance;Decreased mobility;Decreased strength;Prosthetic Dependency;Postural dysfunction   Rehab Potential Good   PT Frequency 2x / week   PT Duration Other (comment)  10 weeks   PT Treatment/Interventions ADLs/Self Care Home Management;Functional mobility training;Stair training;Gait training;Therapeutic activities;Therapeutic exercise;DME Instruction;Balance training;Neuromuscular re-education;Patient/family education;Prosthetic Training;Orthotic Fit/Training   PT Next Visit Plan balance & gait with prosthesis towards updated LTGs   Consulted and Agree with Plan of Care Patient        Problem List Patient Active Problem List   Diagnosis Date Noted  . Below knee amputation status ([place]) 12/08/2014  . Bicuspid aortic valve   . Hyperlipidemia   . Hypertension   . Greater trochanter fracture ([place]) 07/28/2014  . Hip fracture, right ([place]) 07/28/2014  . Diabetes mellitus without complication ([place]) 16/02/9603  . HLD (hyperlipidemia) 07/28/2014  . GERD (gastroesophageal reflux disease) 07/28/2014  . Hip fracture ([place]) 07/28/2014  . Fall 07/28/2014  . Leukocytosis 07/28/2014  . Thrombocytopenia ([place]) 07/28/2014  . H/O aortic valve replacement 10/16/2011  . 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 PLEURAL EFFUSION 05/25/2009  . OPEN WOUND OF CHEST , COMPLICATED 54/01/8118  . PROSTHETIC VALVE - BIO/  ENDOPROSTHESIS 05/25/2009  . DM 04/19/2009  . HYPERLIPIDEMIA 04/19/2009  . Essential hypertension 04/19/2009  . CAD (coronary artery disease) 04/19/2009  . Peripheral vascular disease ([place]) 04/19/2009  . OSTEOMYELITIS 04/19/2009  . BICUSPID AORTIC VALVE 04/19/2009    Ayub Kirsh PT, DPT 04/19/2015, 7:50 AM  [facility] [address] [place], [place], [address] Phone: [phone]   Fax:  [phone]  Name: [name] MRN: [id number] Date of Birth: [birth year]

## 2015-04-20 ENCOUNTER — Ambulatory Visit: Payer: BLUE CROSS/BLUE SHIELD | Admitting: Physical Therapy

## 2015-04-20 DIAGNOSIS — R269 Unspecified abnormalities of gait and mobility: Secondary | ICD-10-CM

## 2015-04-20 DIAGNOSIS — Z89512 Acquired absence of left leg below knee: Secondary | ICD-10-CM

## 2015-04-20 DIAGNOSIS — R531 Weakness: Secondary | ICD-10-CM

## 2015-04-20 DIAGNOSIS — R2689 Other abnormalities of gait and mobility: Secondary | ICD-10-CM

## 2015-04-20 DIAGNOSIS — R2681 Unsteadiness on feet: Secondary | ICD-10-CM

## 2015-04-20 DIAGNOSIS — Z4789 Encounter for other orthopedic aftercare: Secondary | ICD-10-CM

## 2015-04-20 NOTE — Therapy (Signed)
[facility] [address] [place], [place], [address] Phone: [phone]   Fax:  [phone]  Physical Therapy Treatment  Patient Details  Name: [name] MRN: [id number] Date of Birth: Nov 26, 1955 Referring Provider: Meridee Score, MD  Encounter Date: 04/20/2015      PT End of Session - 04/20/15 1324    Visit Number 11   Number of Visits 21   Date for PT Re-Evaluation 05/27/15   PT Start Time 0848   PT Stop Time 0930   PT Time Calculation (min) 42 min   Equipment Utilized During Treatment Gait belt   Activity Tolerance Patient tolerated treatment well   Behavior During Therapy [facility] for tasks assessed/performed      Past Medical History  Diagnosis Date  . CAD (coronary artery disease) October 2010    a. 02/2009 NSTEMI/Cath: 3VD; b. 02/2009 CABG x 5: LIMA->LAD, VG->Diag, VG->RI, VG->RVM->RPDA.  Marland Kitchen Hyperlipidemia   . Hypertension   . PVD (peripheral vascular disease) ([place])     a. s/p toe amputations on L foot.  . Bicuspid aortic valve     a. 02/2009 s/p tissue AVR;  b. 07/2014 Echo: EF 55-60%, basal-mid inf HK, mild AS/MR, mod dil LA, PASP 44 mmHg.  Marland Kitchen GERD (gastroesophageal reflux disease)   . Diabetes mellitus     Type II  . Heart murmur     Past Surgical History  Procedure Laterality Date  . Amputation      Left great and second toes 2011  . Amputation      Left great and second toes 2011  . Aortic valve replacement      Pericardial tissue valve  . Coronary artery bypass graft  10  . Amputation  07/27/2011    Procedure: AMPUTATION FOOT;  Surgeon: Newt Minion, MD;  Location: [facility];  Service: Orthopedics;  Laterality: Left;  Left Midfoot Amputation   . Amputation Left 12/08/2014    Procedure: Left Below Knee Amputation;  Surgeon: Newt Minion, MD;  Location: [facility];  Service: Orthopedics;  Laterality: Left;    There were no vitals filed for this visit.  Visit Diagnosis:  Unsteadiness  Balance  problems  Abnormality of gait  Weakness generalized  Status post below knee amputation of left lower extremity ([place])  Encounter for prosthetic gait training      Subjective Assessment - 04/20/15 0850    Subjective Pt denies falls and reports no significant changes. Pt has been wearing prosthesis 6 hours on, 2 off, 6 hours on. Pt reports "a little spot" on the residual LLE since this past Monday.   Patient Stated Goals to use prosthesis walk without cane in community.    Currently in Pain? No/denies                         [facility] Adult PT Treatment/Exercise - 04/20/15 0001    Ambulation/Gait   Ambulation/Gait Yes   Ambulation/Gait Assistance 5: Supervision;4: Min guard   Ambulation/Gait Assistance Details Addressed obstacle negotiation (stepping around close mat tables, chairs) with noted gait instability.   Ambulation Distance (Feet) 300 Feet   Assistive device Prosthesis;None  prosthesis only household & cane community activities   Gait Pattern Step-through pattern;Decreased stance time - left;Decreased dorsiflexion - right;Trunk flexed;Abducted - left   Ambulation Surface Level;Indoor   Neuro Re-ed    Neuro Re-ed Details  Kicking soccer ball on ground x7 minutes with RLE only (initial 4 minutes)  for increased lateral weight shift to L sidem improved L stance stability with min guard, occasional LOB with effective self--recovery; kicking ball with LLE for final 3 minutes with min guard to min A due to > 5 LOB posteriorly and to R side. Tranistioned to throwing ball anterior, to R side, then to L side at rebounder for A/P balance perturbations x10 consecutive minutes. Cueing focused on use of hip strategy to recover posterior LOB.   Prosthetics   Prosthetic Care Comments  PT recommending pt decrease prosthetic wear time from 6 hours to 5 hours consecutively due to area of increased pressure on L residual limb.   Current prosthetic wear tolerance (days/week)  daily    Current prosthetic wear tolerance (#hours/day)  6 hours x2 with 2 hours break between wear.   Edema none   Residual limb condition  No open areas. Area of increased erythema on distal/anterior aspect of L residual limb. Pt has been wearing band-aid over this area since Monday (12/12).   Education Provided Residual limb care;Correct ply sock adjustment;Proper wear schedule/adjustment   Person(s) Educated Patient   Education Method Explanation   Education Method Verbalized understanding   Donning Prosthesis Modified independent (device/increased time)             Balance Exercises - 04/20/15 1308    Balance Exercises: Standing   Wall Bumps Hip   Wall Bumps-Hips Eyes opened;Anterior/posterior;20 reps;Other (comment)  with cueing for technique   Stepping Strategy Anterior;Posterior;Foam/compliant surface;Other reps (comment)  RLE forward/retro x20 on foam beam with finger tip support   Balance Master: Limits for Stability Pt performed each of the following conditions with LOS 25%, 2-minute durationa and 9-sec holds: center/A/P; center/anterior, and center + anterior targets x3. Pt required cueing to lead weightshifting with hips (as opposed to locking B knees into extension, flexing B hips, and reaching for walls of Balance Master). Visual feedback (Balance Master screen) and verbal cueing focused on increasing pt awareness of pt tendency to maintain weight posteriorly and to R side.            PT Education - 04/20/15 0855    Education provided Yes   Education Details Recommending pt decrease prosthetic wear time from 6 to 5 hours per day due to area of increased pressure on L residual limb.   Person(s) Educated Patient   Methods Explanation   Comprehension Verbalized understanding          PT Short Term Goals - 04/13/15 1215    PT SHORT TERM GOAL #1   Title patient tolerates wear of prosthesis >10 hrs total per day with no new skin issues, current wound healing and no  tenderness. (Target Date: 04/15/2015)   Baseline Assessed: 04/11/15. Pt is wearing prosthesis 12 hrs per day, but does have some skin continuing to heal and reported excess pressure today from incorrect sock ply.   Time 1   Period Months   Status Partially Met   PT SHORT TERM GOAL #2   Title Patient verbalizes & demonstrates proper donning, cleaning and skin checking.  (Target Date: 04/15/2015)   Baseline Achieved: 04/11/15   Time 1   Period Months   Status Achieved   PT SHORT TERM GOAL #3   Title Berg Balance >/= 20 /56.  (Target Date: 04/15/2015)   Baseline Achieved: 04/11/15. Pt scored 38/56 on BERG.   Time 1   Period Months   Status Achieved   PT SHORT TERM GOAL #4   Title Patient ambulates 200'  with single point cane & prosthesis with supervision. (Target Date: 04/15/2015)   Baseline Achieved: 04/11/15. Pt supervision due to foot slap on R and general instability.   Time 1   Period Months   Status Achieved   PT SHORT TERM GOAL #5   Title Patient negotiates ramps, curbs & stairs (1 rail) with single point cane & prosthesis with minA. (Target Date: 04/15/2015)   Baseline Achieved: 04/11/15. Pt min guard assist with curb due to general instability with ascent. Pt min guard with ramp. Pt supervision with 4 steps in reciprocal pattern.   Time 1   Period Months   Status Achieved           PT Long Term Goals - 04/18/15 1015    PT LONG TERM GOAL #1   Title Pateint verbalizes and demonstrates proper prosthetic care to enable safe use of prosthesis. (Target Date: 05/12/2015)   Time 10   Period Weeks   Status On-going   PT LONG TERM GOAL #2   Title Patient tolerates daily wear of prosthesis >90% of awake hours without skin issues or limb pain to enable potential for function throughout his day.  (Target Date: 05/12/2015)   Time 10   Period Weeks   Status On-going   PT LONG TERM GOAL #3   Title Patient ambulates 100' around furniture with prosthesis only carrying household item modified  independent.  (Target Date: 05/12/2015)   Time 10   Period Weeks   Status On-going   PT LONG TERM GOAL #4   Title Patient ambulates 1000' with single point cane and prosthesis on outdoor surfaces modified independent.  (Target Date: 05/12/2015)   Time 10   Period Weeks   Status Revised   PT LONG TERM GOAL #5   Title Patient negotiates ramps, curbs and stairs (1 rail) with single point cane and prosthesis modified independent.  (Target Date: 05/12/2015)   Time 10   Period Weeks   Status On-going   PT LONG TERM GOAL #6   Title Berg Balance >/= 36/56 to decrease risk of falls. (Target Date: 05/12/2015)   Time 10   Period Months               Plan - 04/20/15 1325    Clinical Impression Statement Session focused on standing balance with emphasis on training hip strategy with posterior LOB. When pt loses balance posteriorly, pt exhibits tendency to lock B knees into extension, flex B hips, and to try toi hold onto nearby furniture. Noted area of increased erythema (not open) on L residual limb which began on 12/12. Recommended pt decrease prosthetic wear time fro 6 to 5 consecutive hours until otherwise advised by primary PT.     Pt will benefit from skilled therapeutic intervention in order to improve on the following deficits Abnormal gait;Decreased activity tolerance;Decreased balance;Decreased endurance;Decreased mobility;Decreased strength;Prosthetic Dependency;Postural dysfunction   Rehab Potential Good   PT Frequency 2x / week   PT Duration Other (comment)  10 weeks   PT Treatment/Interventions ADLs/Self Care Home Management;Functional mobility training;Stair training;Gait training;Therapeutic activities;Therapeutic exercise;DME Instruction;Balance training;Neuromuscular re-education;Patient/family education;Prosthetic Training;Orthotic Fit/Training   PT Next Visit Plan balance & gait with prosthesis towards updated LTGs   Consulted and Agree with Plan of Care Patient         Problem List Patient Active Problem List   Diagnosis Date Noted  . Below knee amputation status ([place]) 12/08/2014  . Bicuspid aortic valve   . Hyperlipidemia   . Hypertension   .  Greater trochanter fracture ([place]) 07/28/2014  . Hip fracture, right ([place]) 07/28/2014  . Diabetes mellitus without complication ([place]) 20/02/711  . HLD (hyperlipidemia) 07/28/2014  . GERD (gastroesophageal reflux disease) 07/28/2014  . Hip fracture ([place]) 07/28/2014  . Fall 07/28/2014  . Leukocytosis 07/28/2014  . Thrombocytopenia ([place]) 07/28/2014  . H/O aortic valve replacement 10/16/2011  . 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 PLEURAL EFFUSION 05/25/2009  . OPEN WOUND OF CHEST , COMPLICATED 19/75/8832  . PROSTHETIC VALVE - BIO/ ENDOPROSTHESIS 05/25/2009  . DM 04/19/2009  . HYPERLIPIDEMIA 04/19/2009  . Essential hypertension 04/19/2009  . CAD (coronary artery disease) 04/19/2009  . Peripheral vascular disease ([place]) 04/19/2009  . OSTEOMYELITIS 04/19/2009  . BICUSPID AORTIC VALVE 04/19/2009    Billie Ruddy, PT, DPT [facility] [address] [place], [place], [address] Phone: [phone]   Fax:  [phone] 04/20/2015, 1:28 PM   Name: [name] MRN: [id number] Date of Birth: 08/22/1955

## 2015-04-25 ENCOUNTER — Ambulatory Visit: Payer: BLUE CROSS/BLUE SHIELD | Admitting: Physical Therapy

## 2015-04-25 ENCOUNTER — Encounter: Payer: Self-pay | Admitting: Physical Therapy

## 2015-04-25 DIAGNOSIS — Z89512 Acquired absence of left leg below knee: Secondary | ICD-10-CM

## 2015-04-25 DIAGNOSIS — R2681 Unsteadiness on feet: Secondary | ICD-10-CM

## 2015-04-25 DIAGNOSIS — R269 Unspecified abnormalities of gait and mobility: Secondary | ICD-10-CM

## 2015-04-25 DIAGNOSIS — Z4789 Encounter for other orthopedic aftercare: Secondary | ICD-10-CM

## 2015-04-25 DIAGNOSIS — R2689 Other abnormalities of gait and mobility: Secondary | ICD-10-CM

## 2015-04-25 DIAGNOSIS — R531 Weakness: Secondary | ICD-10-CM

## 2015-04-25 DIAGNOSIS — R6889 Other general symptoms and signs: Secondary | ICD-10-CM

## 2015-04-25 NOTE — Therapy (Signed)
[facility] [address] [place], [place], [address] Phone: [phone]   Fax:  [phone]  Physical Therapy Treatment  Patient Details  Name: [name] MRN: [id number] Date of Birth: 04/07/1956 Referring Provider: Meridee Score, MD  Encounter Date: 04/25/2015      PT End of Session - 04/25/15 0930    Visit Number 12   Number of Visits 21   Date for PT Re-Evaluation 05/27/15   PT Start Time 0845   PT Stop Time 0928   PT Time Calculation (min) 43 min   Equipment Utilized During Treatment Gait belt   Activity Tolerance Patient tolerated treatment well   Behavior During Therapy [facility] for tasks assessed/performed      Past Medical History  Diagnosis Date  . CAD (coronary artery disease) October 2010    a. 02/2009 NSTEMI/Cath: 3VD; b. 02/2009 CABG x 5: LIMA->LAD, VG->Diag, VG->RI, VG->RVM->RPDA.  Marland Kitchen Hyperlipidemia   . Hypertension   . PVD (peripheral vascular disease) ([place])     a. s/p toe amputations on L foot.  . Bicuspid aortic valve     a. 02/2009 s/p tissue AVR;  b. 07/2014 Echo: EF 55-60%, basal-mid inf HK, mild AS/MR, mod dil LA, PASP 44 mmHg.  Marland Kitchen GERD (gastroesophageal reflux disease)   . Diabetes mellitus     Type II  . Heart murmur     Past Surgical History  Procedure Laterality Date  . Amputation      Left great and second toes 2011  . Amputation      Left great and second toes 2011  . Aortic valve replacement      Pericardial tissue valve  . Coronary artery bypass graft  10  . Amputation  07/27/2011    Procedure: AMPUTATION FOOT;  Surgeon: Newt Minion, MD;  Location: [facility];  Service: Orthopedics;  Laterality: Left;  Left Midfoot Amputation   . Amputation Left 12/08/2014    Procedure: Left Below Knee Amputation;  Surgeon: Newt Minion, MD;  Location: [facility];  Service: Orthopedics;  Laterality: Left;    There were no vitals filed for this visit.  Visit Diagnosis:  Unsteadiness  Balance  problems  Abnormality of gait  Weakness generalized  Status post below knee amputation of left lower extremity ([place])  Encounter for prosthetic gait training  Decreased functional activity tolerance      Subjective Assessment - 04/25/15 0849    Subjective No falls or issues. He saw prosthetist who made alignment changes which helped. He wore prosthesis 5hrs on 2hrs off.    Currently in Pain? No/denies                         [facility] Adult PT Treatment/Exercise - 04/25/15 0845    Ambulation/Gait   Ambulation/Gait Yes   Ambulation/Gait Assistance 5: Supervision   Ambulation/Gait Assistance Details scanning, negotiating barriers, changing directions during gait with tactile & verbal cues   Ambulation Distance (Feet) 500 Feet  500' X 2   Assistive device Prosthesis;None   Gait Pattern Step-through pattern;Decreased stance time - left;Decreased dorsiflexion - right;Trunk flexed;Abducted - left   Ambulation Surface Indoor;Level   Stairs Yes   Stairs Assistance 5: Supervision   Stairs Assistance Details (indicate cue type and reason) cues on technique & foot position/ wt shift   Stair Management Technique One rail Left;One rail Right;Forwards;Alternating pattern   Number of Stairs 4  4 reps   Ramp 5: Supervision;4:  Min assist  prosthesis only   Ramp Details (indicate cue type and reason) cues on posture & wt shift   Curb 5: Supervision;4: Min assist  prosthesis only   Curb Details (indicate cue type and reason) cues on technique/ step thru for maintaining momentur   High Level Balance   High Level Balance Activities Side stepping;Backward walking;Direction changes;Turns;Sudden stops;Head turns;Figure 8 turns;Negotitating around obstacles;Negotiating over obstacles   High Level Balance Comments PT demo & instructed in technique for each activity and tactile cues during    Self-Care   Self-Care Lifting   Lifting PT demo, instructed proper technique with TTA  prosthesis. Pt picked up cane from floor and 15# box with cues   Neuro Re-ed    Neuro Re-ed Details  Blue theraband reciprocal & BUE row/pull, overhead reach & forward reach; alternate stepping on/off 4" foam forward & backward with tactile cues. All in parallel bars with occasional touch   Prosthetics   Prosthetic Care Comments  Resume wear 6hrs 2x/day with drying as needed   Current prosthetic wear tolerance (days/week)  daily   Current prosthetic wear tolerance (#hours/day)  6 hours x2 with 2 hours break between wear.   Edema none   Residual limb condition  no open areas.   Education Provided Correct ply sock adjustment;Proper wear schedule/adjustment;Residual limb care   Person(s) Educated Patient   Education Method Explanation;Verbal cues   Education Method Verbalized understanding;Verbal cues required;Needs further instruction   Donning Prosthesis Modified independent (device/increased time)   Doffing Prosthesis Modified independent (device/increased time)                  PT Short Term Goals - 04/13/15 1215    PT SHORT TERM GOAL #1   Title patient tolerates wear of prosthesis >10 hrs total per day with no new skin issues, current wound healing and no tenderness. (Target Date: 04/15/2015)   Baseline Assessed: 04/11/15. Pt is wearing prosthesis 12 hrs per day, but does have some skin continuing to heal and reported excess pressure today from incorrect sock ply.   Time 1   Period Months   Status Partially Met   PT SHORT TERM GOAL #2   Title Patient verbalizes & demonstrates proper donning, cleaning and skin checking.  (Target Date: 04/15/2015)   Baseline Achieved: 04/11/15   Time 1   Period Months   Status Achieved   PT SHORT TERM GOAL #3   Title Berg Balance >/= 20 /56.  (Target Date: 04/15/2015)   Baseline Achieved: 04/11/15. Pt scored 38/56 on BERG.   Time 1   Period Months   Status Achieved   PT SHORT TERM GOAL #4   Title Patient ambulates 200' with single point cane  & prosthesis with supervision. (Target Date: 04/15/2015)   Baseline Achieved: 04/11/15. Pt supervision due to foot slap on R and general instability.   Time 1   Period Months   Status Achieved   PT SHORT TERM GOAL #5   Title Patient negotiates ramps, curbs & stairs (1 rail) with single point cane & prosthesis with minA. (Target Date: 04/15/2015)   Baseline Achieved: 04/11/15. Pt min guard assist with curb due to general instability with ascent. Pt min guard with ramp. Pt supervision with 4 steps in reciprocal pattern.   Time 1   Period Months   Status Achieved           PT Long Term Goals - 04/18/15 1015    PT LONG TERM GOAL #1  Title Pateint verbalizes and demonstrates proper prosthetic care to enable safe use of prosthesis. (Target Date: 05/12/2015)   Time 10   Period Weeks   Status On-going   PT LONG TERM GOAL #2   Title Patient tolerates daily wear of prosthesis >90% of awake hours without skin issues or limb pain to enable potential for function throughout his day.  (Target Date: 05/12/2015)   Time 10   Period Weeks   Status On-going   PT LONG TERM GOAL #3   Title Patient ambulates 100' around furniture with prosthesis only carrying household item modified independent.  (Target Date: 05/12/2015)   Time 10   Period Weeks   Status On-going   PT LONG TERM GOAL #4   Title Patient ambulates 1000' with single point cane and prosthesis on outdoor surfaces modified independent.  (Target Date: 05/12/2015)   Time 10   Period Weeks   Status Revised   PT LONG TERM GOAL #5   Title Patient negotiates ramps, curbs and stairs (1 rail) with single point cane and prosthesis modified independent.  (Target Date: 05/12/2015)   Time 10   Period Weeks   Status On-going   PT LONG TERM GOAL #6   Title Berg Balance >/= 36/56 to decrease risk of falls. (Target Date: 05/12/2015)   Time 10   Period Months               Plan - 04/25/15 0930    Clinical Impression Statement Patient improved ability  to negotiate barriers and change dirctions with prosthesis only with instruction & repetition. Patient is on target to meet LTGs. His residual limb appears ready to increase wear time with attention to adjusting ply socks to decrease distal limb pressure at end of day.    Pt will benefit from skilled therapeutic intervention in order to improve on the following deficits Abnormal gait;Decreased activity tolerance;Decreased balance;Decreased endurance;Decreased mobility;Decreased strength;Prosthetic Dependency;Postural dysfunction   Rehab Potential Good   PT Frequency 2x / week   PT Duration Other (comment)  10 weeks   PT Treatment/Interventions ADLs/Self Care Home Management;Functional mobility training;Stair training;Gait training;Therapeutic activities;Therapeutic exercise;DME Instruction;Balance training;Neuromuscular re-education;Patient/family education;Prosthetic Training;Orthotic Fit/Training   PT Next Visit Plan balance & gait with prosthesis towards updated LTGs   Consulted and Agree with Plan of Care Patient        Problem List Patient Active Problem List   Diagnosis Date Noted  . Below knee amputation status ([place]) 12/08/2014  . Bicuspid aortic valve   . Hyperlipidemia   . Hypertension   . Greater trochanter fracture ([place]) 07/28/2014  . Hip fracture, right ([place]) 07/28/2014  . Diabetes mellitus without complication ([place]) 67/34/1937  . HLD (hyperlipidemia) 07/28/2014  . GERD (gastroesophageal reflux disease) 07/28/2014  . Hip fracture ([place]) 07/28/2014  . Fall 07/28/2014  . Leukocytosis 07/28/2014  . Thrombocytopenia ([place]) 07/28/2014  . H/O aortic valve replacement 10/16/2011  . 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 PLEURAL EFFUSION 05/25/2009  . OPEN WOUND OF CHEST , COMPLICATED 90/24/0973  . PROSTHETIC VALVE - BIO/ ENDOPROSTHESIS 05/25/2009  . DM 04/19/2009  . HYPERLIPIDEMIA 04/19/2009  . Essential hypertension 04/19/2009  . CAD (coronary artery disease) 04/19/2009   . Peripheral vascular disease ([place]) 04/19/2009  . OSTEOMYELITIS 04/19/2009  . BICUSPID AORTIC VALVE 04/19/2009    Jeanenne Licea PT, DPT 04/25/2015, 8:56 PM  [facility] [address] [place], [place], [address] Phone: [phone]   Fax:  [phone]  Name: Eon Zunker MRN: [id number]  Date of Birth: 12-16-1955

## 2015-04-27 ENCOUNTER — Ambulatory Visit: Payer: BLUE CROSS/BLUE SHIELD | Admitting: Physical Therapy

## 2015-04-27 ENCOUNTER — Encounter: Payer: Self-pay | Admitting: Physical Therapy

## 2015-04-27 DIAGNOSIS — Z4789 Encounter for other orthopedic aftercare: Secondary | ICD-10-CM

## 2015-04-27 DIAGNOSIS — R2689 Other abnormalities of gait and mobility: Secondary | ICD-10-CM

## 2015-04-27 DIAGNOSIS — Z89512 Acquired absence of left leg below knee: Secondary | ICD-10-CM

## 2015-04-27 DIAGNOSIS — R2681 Unsteadiness on feet: Secondary | ICD-10-CM

## 2015-04-27 DIAGNOSIS — R531 Weakness: Secondary | ICD-10-CM

## 2015-04-27 DIAGNOSIS — R269 Unspecified abnormalities of gait and mobility: Secondary | ICD-10-CM | POA: Diagnosis not present

## 2015-04-27 DIAGNOSIS — R6889 Other general symptoms and signs: Secondary | ICD-10-CM

## 2015-04-27 NOTE — Therapy (Signed)
[facility] [address] [place], [place], [address] Phone: [phone]   Fax:  [phone]  Physical Therapy Treatment  Patient Details  Name: [name] MRN: [id number] Date of Birth: [birth year] Referring Provider: Meridee Score, MD  Encounter Date: 04/27/2015      PT End of Session - 04/27/15 0930    Visit Number 13   Number of Visits 21   Date for PT Re-Evaluation 05/12/15   PT Start Time 0845   PT Stop Time 0927   PT Time Calculation (min) 42 min   Equipment Utilized During Treatment Gait belt   Activity Tolerance Patient tolerated treatment well   Behavior During Therapy [facility] for tasks assessed/performed      Past Medical History  Diagnosis Date  . CAD (coronary artery disease) October 2010    a. 02/2009 NSTEMI/Cath: 3VD; b. 02/2009 CABG x 5: LIMA->LAD, VG->Diag, VG->RI, VG->RVM->RPDA.  Marland Kitchen Hyperlipidemia   . Hypertension   . PVD (peripheral vascular disease) ([place])     a. s/p toe amputations on L foot.  . Bicuspid aortic valve     a. 02/2009 s/p tissue AVR;  b. 07/2014 Echo: EF 55-60%, basal-mid inf HK, mild AS/MR, mod dil LA, PASP 44 mmHg.  Marland Kitchen GERD (gastroesophageal reflux disease)   . Diabetes mellitus     Type II  . Heart murmur     Past Surgical History  Procedure Laterality Date  . Amputation      Left great and second toes 2011  . Amputation      Left great and second toes 2011  . Aortic valve replacement      Pericardial tissue valve  . Coronary artery bypass graft  10  . Amputation  07/27/2011    Procedure: AMPUTATION FOOT;  Surgeon: Newt Minion, MD;  Location: [facility];  Service: Orthopedics;  Laterality: Left;  Left Midfoot Amputation   . Amputation Left 12/08/2014    Procedure: Left Below Knee Amputation;  Surgeon: Newt Minion, MD;  Location: [facility];  Service: Orthopedics;  Laterality: Left;    There were no vitals filed for this visit.  Visit Diagnosis:  Unsteadiness  Balance  problems  Abnormality of gait  Weakness generalized  Status post below knee amputation of left lower extremity ([place])  Encounter for prosthetic gait training  Decreased functional activity tolerance      Subjective Assessment - 04/27/15 0848    Subjective (p) Wearing prostheses all day mainly. He did add a sock on Monday when he felt bottom pressure but yesterday no issues. He has a blister on back of leg at top of liner area.    Currently in Pain? (p) No/denies                         [facility] Adult PT Treatment/Exercise - 04/27/15 0845    Ambulation/Gait   Ambulation/Gait Yes   Ambulation/Gait Assistance 5: Supervision   Ambulation/Gait Assistance Details tactile, verbal cues on step length, posture & negotiating obstacles, increasing speed   Ambulation Distance (Feet) 600 Feet  600' X 2   Assistive device Prosthesis;None   Gait Pattern Step-through pattern;Decreased stance time - left;Decreased dorsiflexion - right;Trunk flexed;Abducted - left   Ambulation Surface Indoor;Level   Stairs Yes   Stairs Assistance 5: Supervision   Stairs Assistance Details (indicate cue type and reason) cues on knee control   Stair Management Technique One rail Left;One rail Right;Forwards;Alternating pattern  Number of Stairs 4  4 reps   Ramp 5: Supervision;4: Min assist  prosthesis only   Ramp Details (indicate cue type and reason) cues on wt shift & posture   Curb 5: Supervision;4: Min assist  prosthesis only   Curb Details (indicate cue type and reason) cues on step thru pattern for balance   Balance   Balance Assessed Yes   Dynamic Standing Balance   Compliant surfaces comments: stepping on/off compliant 4" beam forward, backward and sideways.    Rocker board comments: stabilization, head turns & wt shifts with minA / tactile cues using mirror for visual feedback   Alternating foot traps comments: MinA / tactile cues for balance reactions   High Level Balance   High  Level Balance Activities Side stepping;Backward walking;Direction changes;Turns;Sudden stops;Head turns;Figure 8 turns;Negotitating around obstacles;Negotiating over obstacles;Braiding;Marching forwards;Weight-shifting turns   High Level Balance Comments PT demo & instructed in technique for each activity and tactile cues during    Self-Care   Self-Care Lifting   Lifting PT demo, instructed proper technique with TTA prosthesis. Pt picked up cane from floor and 15# box with cues but poor control sitting down box with lower handle   Neuro Re-ed    Neuro Re-ed Details  Blue theraband reciprocal & BUE row/pull, overhead reach & forward reach; alternate stepping on/off 4" foam forward & backward with tactile cues. All in parallel bars with occasional touch   Prosthetics   Prosthetic Care Comments  use of cut-off sock proximally under liner to wick sweat and over liner distally to increase volume management. Sweat signs and drying prn & q6hrs for skin management   Current prosthetic wear tolerance (days/week)  daily   Current prosthetic wear tolerance (#hours/day)  wore prosthesis all awake hours / PT recommended 6hrs 2x/day with minimum of 2hr break between wear for skin protection.   Current prosthetic weight-bearing tolerance (hours/day)  tolerates 19mn of standing / gait with no pain reported   Edema none   Residual limb condition  blister distal limb proximal to knee at top of liner, no signs of infection   Education Provided Correct ply sock adjustment;Proper wear schedule/adjustment;Residual limb care  see prosthetic care   Person(s) Educated Patient   Education Method Explanation;Demonstration;Verbal cues   Education Method Verbalized understanding;Verbal cues required;Needs further instruction   Donning Prosthesis Modified independent (device/increased time)   Doffing Prosthesis Modified independent (device/increased time)                  PT Short Term Goals - 04/13/15 1215     PT SHORT TERM GOAL #1   Title patient tolerates wear of prosthesis >10 hrs total per day with no new skin issues, current wound healing and no tenderness. (Target Date: 04/15/2015)   Baseline Assessed: 04/11/15. Pt is wearing prosthesis 12 hrs per day, but does have some skin continuing to heal and reported excess pressure today from incorrect sock ply.   Time 1   Period Months   Status Partially Met   PT SHORT TERM GOAL #2   Title Patient verbalizes & demonstrates proper donning, cleaning and skin checking.  (Target Date: 04/15/2015)   Baseline Achieved: 04/11/15   Time 1   Period Months   Status Achieved   PT SHORT TERM GOAL #3   Title Berg Balance >/= 20 /56.  (Target Date: 04/15/2015)   Baseline Achieved: 04/11/15. Pt scored 38/56 on BERG.   Time 1   Period Months   Status Achieved  PT SHORT TERM GOAL #4   Title Patient ambulates 200' with single point cane & prosthesis with supervision. (Target Date: 04/15/2015)   Baseline Achieved: 04/11/15. Pt supervision due to foot slap on R and general instability.   Time 1   Period Months   Status Achieved   PT SHORT TERM GOAL #5   Title Patient negotiates ramps, curbs & stairs (1 rail) with single point cane & prosthesis with minA. (Target Date: 04/15/2015)   Baseline Achieved: 04/11/15. Pt min guard assist with curb due to general instability with ascent. Pt min guard with ramp. Pt supervision with 4 steps in reciprocal pattern.   Time 1   Period Months   Status Achieved           PT Long Term Goals - 04/18/15 1015    PT LONG TERM GOAL #1   Title Pateint verbalizes and demonstrates proper prosthetic care to enable safe use of prosthesis. (Target Date: 05/12/2015)   Time 10   Period Weeks   Status On-going   PT LONG TERM GOAL #2   Title Patient tolerates daily wear of prosthesis >90% of awake hours without skin issues or limb pain to enable potential for function throughout his day.  (Target Date: 05/12/2015)   Time 10   Period Weeks    Status On-going   PT LONG TERM GOAL #3   Title Patient ambulates 100' around furniture with prosthesis only carrying household item modified independent.  (Target Date: 05/12/2015)   Time 10   Period Weeks   Status On-going   PT LONG TERM GOAL #4   Title Patient ambulates 1000' with single point cane and prosthesis on outdoor surfaces modified independent.  (Target Date: 05/12/2015)   Time 10   Period Weeks   Status Revised   PT LONG TERM GOAL #5   Title Patient negotiates ramps, curbs and stairs (1 rail) with single point cane and prosthesis modified independent.  (Target Date: 05/12/2015)   Time 10   Period Weeks   Status On-going   PT LONG TERM GOAL #6   Title Berg Balance >/= 36/56 to decrease risk of falls. (Target Date: 05/12/2015)   Time 10   Period Months               Plan - 04/27/15 0930    Clinical Impression Statement Patient appears to have blister from improper sweat management with increased wear longer than recommended. Patient's balance has improved with less loss with higher functional task. Patient is on target to meet LTGs in next 2 weeks.    Pt will benefit from skilled therapeutic intervention in order to improve on the following deficits Abnormal gait;Decreased activity tolerance;Decreased balance;Decreased endurance;Decreased mobility;Decreased strength;Prosthetic Dependency;Postural dysfunction   Rehab Potential Good   PT Frequency 2x / week   PT Duration Other (comment)  10 weeks   PT Treatment/Interventions ADLs/Self Care Home Management;Functional mobility training;Stair training;Gait training;Therapeutic activities;Therapeutic exercise;DME Instruction;Balance training;Neuromuscular re-education;Patient/family education;Prosthetic Training;Orthotic Fit/Training   PT Next Visit Plan balance & gait with prosthesis towards updated LTGs   Consulted and Agree with Plan of Care Patient        Problem List Patient Active Problem List   Diagnosis Date Noted   . Below knee amputation status ([place]) 12/08/2014  . Bicuspid aortic valve   . Hyperlipidemia   . Hypertension   . Greater trochanter fracture ([place]) 07/28/2014  . Hip fracture, right ([place]) 07/28/2014  . Diabetes mellitus without complication ([place]) 96/29/5284  . HLD (hyperlipidemia)  07/28/2014  . GERD (gastroesophageal reflux disease) 07/28/2014  . Hip fracture ([place]) 07/28/2014  . Fall 07/28/2014  . Leukocytosis 07/28/2014  . Thrombocytopenia ([place]) 07/28/2014  . H/O aortic valve replacement 10/16/2011  . 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 PLEURAL EFFUSION 05/25/2009  . OPEN WOUND OF CHEST , COMPLICATED 16/75/6125  . PROSTHETIC VALVE - BIO/ ENDOPROSTHESIS 05/25/2009  . DM 04/19/2009  . HYPERLIPIDEMIA 04/19/2009  . Essential hypertension 04/19/2009  . CAD (coronary artery disease) 04/19/2009  . Peripheral vascular disease ([place]) 04/19/2009  . OSTEOMYELITIS 04/19/2009  . BICUSPID AORTIC VALVE 04/19/2009    Sanaia Jasso PT, DPT 04/27/2015, 7:20 PM  [facility] [address] [place], [place], [address] Phone: [phone]   Fax:  [phone]  Name: [name] MRN: [id number] Date of Birth: April 09, 1956

## 2015-05-04 ENCOUNTER — Encounter: Payer: Self-pay | Admitting: Physical Therapy

## 2015-05-04 ENCOUNTER — Ambulatory Visit: Payer: BLUE CROSS/BLUE SHIELD | Admitting: Physical Therapy

## 2015-05-04 DIAGNOSIS — R2681 Unsteadiness on feet: Secondary | ICD-10-CM

## 2015-05-04 DIAGNOSIS — R531 Weakness: Secondary | ICD-10-CM

## 2015-05-04 DIAGNOSIS — R2689 Other abnormalities of gait and mobility: Secondary | ICD-10-CM

## 2015-05-04 DIAGNOSIS — R269 Unspecified abnormalities of gait and mobility: Secondary | ICD-10-CM | POA: Diagnosis not present

## 2015-05-04 DIAGNOSIS — R6889 Other general symptoms and signs: Secondary | ICD-10-CM

## 2015-05-04 DIAGNOSIS — Z4789 Encounter for other orthopedic aftercare: Secondary | ICD-10-CM

## 2015-05-04 DIAGNOSIS — M21371 Foot drop, right foot: Secondary | ICD-10-CM

## 2015-05-04 DIAGNOSIS — Z89512 Acquired absence of left leg below knee: Secondary | ICD-10-CM

## 2015-05-04 NOTE — Therapy (Signed)
[facility] [address] [place], [place], [address] Phone: [phone]   Fax:  [phone]  Physical Therapy Treatment  Patient Details  Name: [name] MRN: [id number] Date of Birth: 1955-07-14 Referring Provider: Meridee Score, MD  Encounter Date: 05/04/2015      PT End of Session - 05/04/15 0945    Visit Number 14   Number of Visits 21   Date for PT Re-Evaluation 05/12/15   PT Start Time 0845   PT Stop Time 0923   PT Time Calculation (min) 38 min   Equipment Utilized During Treatment Gait belt   Activity Tolerance Patient tolerated treatment well   Behavior During Therapy [facility] for tasks assessed/performed      Past Medical History  Diagnosis Date  . CAD (coronary artery disease) October 2010    a. 02/2009 NSTEMI/Cath: 3VD; b. 02/2009 CABG x 5: LIMA->LAD, VG->Diag, VG->RI, VG->RVM->RPDA.  Marland Kitchen Hyperlipidemia   . Hypertension   . PVD (peripheral vascular disease) ([place])     a. s/p toe amputations on L foot.  . Bicuspid aortic valve     a. 02/2009 s/p tissue AVR;  b. 07/2014 Echo: EF 55-60%, basal-mid inf HK, mild AS/MR, mod dil LA, PASP 44 mmHg.  Marland Kitchen GERD (gastroesophageal reflux disease)   . Diabetes mellitus     Type II  . Heart murmur     Past Surgical History  Procedure Laterality Date  . Amputation      Left great and second toes 2011  . Amputation      Left great and second toes 2011  . Aortic valve replacement      Pericardial tissue valve  . Coronary artery bypass graft  10  . Amputation  07/27/2011    Procedure: AMPUTATION FOOT;  Surgeon: Newt Minion, MD;  Location: [facility];  Service: Orthopedics;  Laterality: Left;  Left Midfoot Amputation   . Amputation Left 12/08/2014    Procedure: Left Below Knee Amputation;  Surgeon: Newt Minion, MD;  Location: [facility];  Service: Orthopedics;  Laterality: Left;    There were no vitals filed for this visit.  Visit Diagnosis:  Unsteadiness  Balance  problems  Abnormality of gait  Weakness generalized  Status post below knee amputation of left lower extremity ([place])  Encounter for prosthetic gait training  Decreased functional activity tolerance  Foot drop, right      Subjective Assessment - 05/04/15 0848    Subjective no falls. Wearing prosthesis 6hrs 2x/day as advised with no issues. He reports wearing alot of socks.   Currently in Pain? No/denies                         [facility] Adult PT Treatment/Exercise - 05/04/15 0845    Ambulation/Gait   Ambulation/Gait Yes   Ambulation/Gait Assistance 5: Supervision   Ambulation/Gait Assistance Details tactile & verbal cues on step width control, posture & step length.    Ambulation Distance (Feet) 700 Feet  700' inside & 500' outside   Assistive device Prosthesis;None   Gait Pattern Step-through pattern;Decreased stance time - left;Decreased dorsiflexion - right;Trunk flexed;Abducted - left   Ambulation Surface Indoor;Level;Outdoor;Paved;Gravel;Grass;Unlevel   Stairs Yes   Stairs Assistance 5: Supervision   Stair Management Technique One rail Left;One rail Right;Forwards;Alternating pattern   Number of Stairs 4  4 reps   Ramp 5: Supervision;4: Min assist  prosthesis only   Ramp Details (indicate cue type and reason) cues  on balance reactions   Curb 5: Supervision;4: Min assist  prosthesis only   Curb Details (indicate cue type and reason) tactile cues on balance reactions   Balance   Balance Assessed Yes   Dynamic Standing Balance   Compliant surfaces comments: --   Rocker board comments: --   Alternating foot traps comments: --   High Level Balance   High Level Balance Activities Side stepping;Backward walking;Direction changes;Turns;Sudden stops;Head turns;Figure 8 turns;Negotitating around obstacles;Negotiating over obstacles;Braiding;Marching forwards;Weight-shifting turns   High Level Balance Comments tactile & verbal cues for balance reactions; pt  improved step strategy for maintaining balance   Self-Care   Self-Care Lifting   Lifting tactile & verbal cues on technique & balance reactions   Neuro Re-ed    Neuro Re-ed Details  Blue theraband reciprocal & BUE row/pull, overhead reach & forward reach; alternate stepping on/off 4" foam forward & backward with tactile cues. All in parallel bars with occasional touch   Prosthetics   Prosthetic Care Comments  reviewed use of cut-off sock proximally under liner to wick sweat and over liner distally to increase volume management. Sweat signs and drying prn & q7hrs for skin management. PT called prosthetist to request adding pre-tibial pads and appt scheduled immediately following PT today.    Current prosthetic wear tolerance (days/week)  daily   Current prosthetic wear tolerance (#hours/day)  wearing 6hrs 2x/day as PT recommended. PT increased today to 7hrs 2x/day with off for 1 hr between wears and drying ~half way of each wear.    Current prosthetic weight-bearing tolerance (hours/day)  tolerates 76mn of standing / gait with no pain reported   Edema none   Residual limb condition  blister healed and dried. No other open areas noted. normal color & temperature.    Education Provided Correct ply sock adjustment;Proper wear schedule/adjustment;Residual limb care  see prosthetic care   Person(s) Educated Patient   Education Method Explanation;Verbal cues   Education Method Verbalized understanding;Verbal cues required;Needs further instruction   Donning Prosthesis Modified independent (device/increased time)   Doffing Prosthesis Modified independent (device/increased time)                  PT Short Term Goals - 04/13/15 1215    PT SHORT TERM GOAL #1   Title patient tolerates wear of prosthesis >10 hrs total per day with no new skin issues, current wound healing and no tenderness. (Target Date: 04/15/2015)   Baseline Assessed: 04/11/15. Pt is wearing prosthesis 12 hrs per day, but does  have some skin continuing to heal and reported excess pressure today from incorrect sock ply.   Time 1   Period Months   Status Partially Met   PT SHORT TERM GOAL #2   Title Patient verbalizes & demonstrates proper donning, cleaning and skin checking.  (Target Date: 04/15/2015)   Baseline Achieved: 04/11/15   Time 1   Period Months   Status Achieved   PT SHORT TERM GOAL #3   Title Berg Balance >/= 20 /56.  (Target Date: 04/15/2015)   Baseline Achieved: 04/11/15. Pt scored 38/56 on BERG.   Time 1   Period Months   Status Achieved   PT SHORT TERM GOAL #4   Title Patient ambulates 200' with single point cane & prosthesis with supervision. (Target Date: 04/15/2015)   Baseline Achieved: 04/11/15. Pt supervision due to foot slap on R and general instability.   Time 1   Period Months   Status Achieved   PT SHORT TERM GOAL #  5   Title Patient negotiates ramps, curbs & stairs (1 rail) with single point cane & prosthesis with minA. (Target Date: 04/15/2015)   Baseline Achieved: 04/11/15. Pt min guard assist with curb due to general instability with ascent. Pt min guard with ramp. Pt supervision with 4 steps in reciprocal pattern.   Time 1   Period Months   Status Achieved           PT Long Term Goals - 04/18/15 1015    PT LONG TERM GOAL #1   Title Pateint verbalizes and demonstrates proper prosthetic care to enable safe use of prosthesis. (Target Date: 05/12/2015)   Time 10   Period Weeks   Status On-going   PT LONG TERM GOAL #2   Title Patient tolerates daily wear of prosthesis >90% of awake hours without skin issues or limb pain to enable potential for function throughout his day.  (Target Date: 05/12/2015)   Time 10   Period Weeks   Status On-going   PT LONG TERM GOAL #3   Title Patient ambulates 100' around furniture with prosthesis only carrying household item modified independent.  (Target Date: 05/12/2015)   Time 10   Period Weeks   Status On-going   PT LONG TERM GOAL #4   Title  Patient ambulates 1000' with single point cane and prosthesis on outdoor surfaces modified independent.  (Target Date: 05/12/2015)   Time 10   Period Weeks   Status Revised   PT LONG TERM GOAL #5   Title Patient negotiates ramps, curbs and stairs (1 rail) with single point cane and prosthesis modified independent.  (Target Date: 05/12/2015)   Time 10   Period Weeks   Status On-going   PT LONG TERM GOAL #6   Title Berg Balance >/= 36/56 to decrease risk of falls. (Target Date: 05/12/2015)   Time 10   Period Months               Plan - 05/04/15 0945    Clinical Impression Statement Patient reports following PT recommendations for wear including using cut-off sock under liner proximal to knee to wick sweat with improved skin integrity. PT increased wear working towards prosthesis available all awake hours for function & improved balance. Patient reports a reduction in falls over last 2 weeks. Patient is on target to meet LTGs.    Pt will benefit from skilled therapeutic intervention in order to improve on the following deficits Abnormal gait;Decreased activity tolerance;Decreased balance;Decreased endurance;Decreased mobility;Decreased strength;Prosthetic Dependency;Postural dysfunction   Rehab Potential Good   PT Frequency 2x / week   PT Duration Other (comment)  10 weeks   PT Treatment/Interventions ADLs/Self Care Home Management;Functional mobility training;Stair training;Gait training;Therapeutic activities;Therapeutic exercise;DME Instruction;Balance training;Neuromuscular re-education;Patient/family education;Prosthetic Training;Orthotic Fit/Training   PT Next Visit Plan balance & gait with prosthesis towards updated LTGs   Consulted and Agree with Plan of Care Patient        Problem List Patient Active Problem List   Diagnosis Date Noted  . Below knee amputation status ([place]) 12/08/2014  . Bicuspid aortic valve   . Hyperlipidemia   . Hypertension   . Greater trochanter  fracture ([place]) 07/28/2014  . Hip fracture, right ([place]) 07/28/2014  . Diabetes mellitus without complication ([place]) 00/92/3300  . HLD (hyperlipidemia) 07/28/2014  . GERD (gastroesophageal reflux disease) 07/28/2014  . Hip fracture ([place]) 07/28/2014  . Fall 07/28/2014  . Leukocytosis 07/28/2014  . Thrombocytopenia ([place]) 07/28/2014  . H/O aortic valve replacement 10/16/2011  . CAD, AUTOLOGOUS  BYPASS GRAFT 05/25/2009  . PLEURAL EFFUSION 05/25/2009  . OPEN WOUND OF CHEST , COMPLICATED 34/19/6222  . PROSTHETIC VALVE - BIO/ ENDOPROSTHESIS 05/25/2009  . DM 04/19/2009  . HYPERLIPIDEMIA 04/19/2009  . Essential hypertension 04/19/2009  . CAD (coronary artery disease) 04/19/2009  . Peripheral vascular disease ([place]) 04/19/2009  . OSTEOMYELITIS 04/19/2009  . BICUSPID AORTIC VALVE 04/19/2009    Mikela Senn PT, DPT 05/04/2015, 9:48 AM  [facility] [address] [place], [place], [address] Phone: [phone]   Fax:  [phone]  Name: [name] MRN: [id number] Date of Birth: 06-09-1955

## 2015-05-06 ENCOUNTER — Ambulatory Visit: Payer: BLUE CROSS/BLUE SHIELD | Admitting: Physical Therapy

## 2015-05-06 ENCOUNTER — Encounter: Payer: Self-pay | Admitting: Physical Therapy

## 2015-05-06 DIAGNOSIS — R269 Unspecified abnormalities of gait and mobility: Secondary | ICD-10-CM

## 2015-05-06 DIAGNOSIS — R2689 Other abnormalities of gait and mobility: Secondary | ICD-10-CM

## 2015-05-06 DIAGNOSIS — R2681 Unsteadiness on feet: Secondary | ICD-10-CM

## 2015-05-06 DIAGNOSIS — R531 Weakness: Secondary | ICD-10-CM

## 2015-05-06 NOTE — Therapy (Signed)
[facility] [address] [place], [place], [address] Phone: [phone]   Fax:  [phone]  Physical Therapy Treatment  Patient Details  Name: [name] MRN: [id number] Date of Birth: 11/18/1955 Referring Provider: Meridee Score, MD  Encounter Date: 05/06/2015      PT End of Session - 05/06/15 0851    Visit Number 15   Number of Visits 21   Date for PT Re-Evaluation 05/12/15   PT Start Time 0848   PT Stop Time 0927   PT Time Calculation (min) 39 min   Equipment Utilized During Treatment Gait belt   Activity Tolerance Patient tolerated treatment well   Behavior During Therapy [facility] for tasks assessed/performed      Past Medical History  Diagnosis Date  . CAD (coronary artery disease) October 2010    a. 02/2009 NSTEMI/Cath: 3VD; b. 02/2009 CABG x 5: LIMA->LAD, VG->Diag, VG->RI, VG->RVM->RPDA.  Marland Kitchen Hyperlipidemia   . Hypertension   . PVD (peripheral vascular disease) ([place])     a. s/p toe amputations on L foot.  . Bicuspid aortic valve     a. 02/2009 s/p tissue AVR;  b. 07/2014 Echo: EF 55-60%, basal-mid inf HK, mild AS/MR, mod dil LA, PASP 44 mmHg.  Marland Kitchen GERD (gastroesophageal reflux disease)   . Diabetes mellitus     Type II  . Heart murmur     Past Surgical History  Procedure Laterality Date  . Amputation      Left great and second toes 2011  . Amputation      Left great and second toes 2011  . Aortic valve replacement      Pericardial tissue valve  . Coronary artery bypass graft  10  . Amputation  07/27/2011    Procedure: AMPUTATION FOOT;  Surgeon: Newt Minion, MD;  Location: [facility];  Service: Orthopedics;  Laterality: Left;  Left Midfoot Amputation   . Amputation Left 12/08/2014    Procedure: Left Below Knee Amputation;  Surgeon: Newt Minion, MD;  Location: [facility] [place];  Service: Orthopedics;  Laterality: Left;    There were no vitals filed for this visit.  Visit Diagnosis:  Unsteadiness  Balance  problems  Abnormality of gait  Weakness generalized      Subjective Assessment - 05/06/15 0851    Subjective No new complaints. No falls or pain to report.    Currently in Pain? No/denies   Pain Score 0-No pain           [facility] Adult PT Treatment/Exercise - 05/06/15 0852    Transfers   Sit to Stand 5: Supervision;With upper extremity assist;With armrests;From chair/3-in-1   Stand to Sit 5: Supervision;With upper extremity assist;With armrests;To chair/3-in-1   Ambulation/Gait   Ambulation/Gait Yes   Ambulation/Gait Assistance 5: Supervision   Ambulation/Gait Assistance Details multimodal cues on posture, step lenght and base of support (to decreaese it).   Ambulation Distance (Feet) 650 Feet   Assistive device Prosthesis;None   Gait Pattern Step-through pattern;Decreased stance time - left;Decreased dorsiflexion - right;Trunk flexed;Abducted - left   Ambulation Surface Level;Indoor   Ramp Other (comment);5: Supervision  min guard assist, prosthesis only   Ramp Details (indicate cue type and reason) x 3 reps with cues on sequence and technique   Curb 4: Min assist;5: Supervision;Other (comment)  prosthesis only   Curb Details (indicate cue type and reason) x 3 laps with cues on technique and sequence,    High Level Balance   High Level Balance  Activities Side stepping;Tandem walking;Marching forwards;Marching backwards   High Level Balance Comments in parallel bars: 3 laps each way with intermittent UE support. cues on form and balance   Prosthetics   Current prosthetic wear tolerance (days/week)  daily   Current prosthetic wear tolerance (#hours/day)  7 hours on, off for 1 hour, back on till bedtime  drying as needed per pt report   Residual limb condition  intact per pt   Education Provided Correct ply sock adjustment;Proper wear schedule/adjustment;Residual limb care   Person(s) Educated Patient   Education Method Explanation   Education Method Verbalized understanding    Donning Prosthesis Modified independent (device/increased time)   Doffing Prosthesis Modified independent (device/increased time)          Balance Exercises - 05/06/15 0925    Balance Exercises: Standing   Standing Eyes Closed Head turns;Narrow base of support (BOS);Wide (BOA);Foam/compliant surface;Other reps (comment);Limitations   SLS Eyes open;Solid surface;Intermittent upper extremity support;Other reps (comment);Limitations   Balance Beam standing with feet across blue foam beam: intermittent UE support: alternating forward stepping and back onto foam beam x 10 reps each leg, min assist for balance.                                         Balance Exercises: Standing   Standing Eyes Closed Limitations on airex: wide base of support then narrow base of support: EC no head movements, EC with head nods/shakes/diagonals both ways. min guard to min assist for balance with no UE support.                               SLS Limitations with 6 inch box; alternating foot taps forward and crossways x 10 each bil legs, no UE support with min assist for balance             PT Short Term Goals - 04/13/15 1215    PT SHORT TERM GOAL #1   Title patient tolerates wear of prosthesis >10 hrs total per day with no new skin issues, current wound healing and no tenderness. (Target Date: 04/15/2015)   Baseline Assessed: 04/11/15. Pt is wearing prosthesis 12 hrs per day, but does have some skin continuing to heal and reported excess pressure today from incorrect sock ply.   Time 1   Period Months   Status Partially Met   PT SHORT TERM GOAL #2   Title Patient verbalizes & demonstrates proper donning, cleaning and skin checking.  (Target Date: 04/15/2015)   Baseline Achieved: 04/11/15   Time 1   Period Months   Status Achieved   PT SHORT TERM GOAL #3   Title Berg Balance >/= 20 /56.  (Target Date: 04/15/2015)   Baseline Achieved: 04/11/15. Pt scored 38/56 on BERG.   Time 1   Period Months   Status  Achieved   PT SHORT TERM GOAL #4   Title Patient ambulates 200' with single point cane & prosthesis with supervision. (Target Date: 04/15/2015)   Baseline Achieved: 04/11/15. Pt supervision due to foot slap on R and general instability.   Time 1   Period Months   Status Achieved   PT SHORT TERM GOAL #5   Title Patient negotiates ramps, curbs & stairs (1 rail) with single point cane & prosthesis with minA. (Target Date: 04/15/2015)   Baseline Achieved: 04/11/15. Pt  min guard assist with curb due to general instability with ascent. Pt min guard with ramp. Pt supervision with 4 steps in reciprocal pattern.   Time 1   Period Months   Status Achieved           PT Long Term Goals - 04/18/15 1015    PT LONG TERM GOAL #1   Title Pateint verbalizes and demonstrates proper prosthetic care to enable safe use of prosthesis. (Target Date: 05/12/2015)   Time 10   Period Weeks   Status On-going   PT LONG TERM GOAL #2   Title Patient tolerates daily wear of prosthesis >90% of awake hours without skin issues or limb pain to enable potential for function throughout his day.  (Target Date: 05/12/2015)   Time 10   Period Weeks   Status On-going   PT LONG TERM GOAL #3   Title Patient ambulates 100' around furniture with prosthesis only carrying household item modified independent.  (Target Date: 05/12/2015)   Time 10   Period Weeks   Status On-going   PT LONG TERM GOAL #4   Title Patient ambulates 1000' with single point cane and prosthesis on outdoor surfaces modified independent.  (Target Date: 05/12/2015)   Time 10   Period Weeks   Status Revised   PT LONG TERM GOAL #5   Title Patient negotiates ramps, curbs and stairs (1 rail) with single point cane and prosthesis modified independent.  (Target Date: 05/12/2015)   Time 10   Period Weeks   Status On-going   PT LONG TERM GOAL #6   Title Berg Balance >/= 36/56 to decrease risk of falls. (Target Date: 05/12/2015)   Time 10   Period Months            Plan - 05/06/15 8288    Clinical Impression Statement Skilled session focued on prothetic training with gait/barriers and on balance reations. No issues reported with session. Pt continues to be challenged on complaint surfaces and with dynamic gait activities.    Pt will benefit from skilled therapeutic intervention in order to improve on the following deficits Abnormal gait;Decreased activity tolerance;Decreased balance;Decreased endurance;Decreased mobility;Decreased strength;Prosthetic Dependency;Postural dysfunction   Rehab Potential Good   PT Frequency 2x / week   PT Duration Other (comment)  10 weeks   PT Treatment/Interventions ADLs/Self Care Home Management;Functional mobility training;Stair training;Gait training;Therapeutic activities;Therapeutic exercise;DME Instruction;Balance training;Neuromuscular re-education;Patient/family education;Prosthetic Training;Orthotic Fit/Training   PT Next Visit Plan balance & gait with prosthesis towards updated LTGs   Consulted and Agree with Plan of Care Patient        Problem List Patient Active Problem List   Diagnosis Date Noted  . Below knee amputation status ([place]) 12/08/2014  . Bicuspid aortic valve   . Hyperlipidemia   . Hypertension   . Greater trochanter fracture ([place]) 07/28/2014  . Hip fracture, right ([place]) 07/28/2014  . Diabetes mellitus without complication ([place]) 33/74/4514  . HLD (hyperlipidemia) 07/28/2014  . GERD (gastroesophageal reflux disease) 07/28/2014  . Hip fracture ([place]) 07/28/2014  . Fall 07/28/2014  . Leukocytosis 07/28/2014  . Thrombocytopenia ([place]) 07/28/2014  . H/O aortic valve replacement 10/16/2011  . 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 PLEURAL EFFUSION 05/25/2009  . OPEN WOUND OF CHEST , COMPLICATED 60/47/9987  . PROSTHETIC VALVE - BIO/ ENDOPROSTHESIS 05/25/2009  . DM 04/19/2009  . HYPERLIPIDEMIA 04/19/2009  . Essential hypertension 04/19/2009  . CAD (coronary artery disease) 04/19/2009  .  Peripheral vascular disease ([place]) 04/19/2009  . OSTEOMYELITIS 04/19/2009  .  BICUSPID AORTIC VALVE 04/19/2009    Willow Ora 05/06/2015, 3:02 PM  Willow Ora, PTA, [facility] [address] [place], [place] [address] [phone] 05/06/2015, 3:02 PM   Name: [name] MRN: [id number] Date of Birth: [birth year]

## 2015-05-09 ENCOUNTER — Encounter: Payer: BLUE CROSS/BLUE SHIELD | Admitting: Physical Therapy

## 2015-05-10 ENCOUNTER — Encounter: Payer: Self-pay | Admitting: Physical Therapy

## 2015-05-10 ENCOUNTER — Ambulatory Visit: Payer: BLUE CROSS/BLUE SHIELD | Attending: Orthopedic Surgery | Admitting: Physical Therapy

## 2015-05-10 DIAGNOSIS — Z5189 Encounter for other specified aftercare: Secondary | ICD-10-CM | POA: Diagnosis present

## 2015-05-10 DIAGNOSIS — Z89512 Acquired absence of left leg below knee: Secondary | ICD-10-CM | POA: Diagnosis present

## 2015-05-10 DIAGNOSIS — R269 Unspecified abnormalities of gait and mobility: Secondary | ICD-10-CM

## 2015-05-10 DIAGNOSIS — R29818 Other symptoms and signs involving the nervous system: Secondary | ICD-10-CM | POA: Insufficient documentation

## 2015-05-10 DIAGNOSIS — R531 Weakness: Secondary | ICD-10-CM | POA: Diagnosis present

## 2015-05-10 DIAGNOSIS — R2681 Unsteadiness on feet: Secondary | ICD-10-CM

## 2015-05-10 DIAGNOSIS — R2689 Other abnormalities of gait and mobility: Secondary | ICD-10-CM

## 2015-05-10 DIAGNOSIS — Z4789 Encounter for other orthopedic aftercare: Secondary | ICD-10-CM

## 2015-05-10 NOTE — Therapy (Signed)
[facility] [address] [place], [place], [address] Phone: [phone]   Fax:  [phone]  Physical Therapy Treatment  Patient Details  Name: [name] MRN: [id number] Date of Birth: May 10, 1955 Referring Provider: Meridee Score, MD  Encounter Date: 05/10/2015  PHYSICAL THERAPY DISCHARGE SUMMARY  Visits from Start of Care: 16  Current functional level related to goals / functional outcomes: See below   Remaining deficits: Berg Balance 48/56 indicates moderate fall risk.    Education / Equipment: Prosthetic care & HEP Plan: Patient agrees to discharge.  Patient goals were met. Patient is being discharged due to meeting the stated rehab goals.  ?????            PT End of Session - 05/10/15 0929    Visit Number 16   Number of Visits 21   Date for PT Re-Evaluation 05/12/15   PT Start Time 0845   PT Stop Time 0920   PT Time Calculation (min) 35 min   Activity Tolerance Patient tolerated treatment well   Behavior During Therapy [facility] for tasks assessed/performed      Past Medical History  Diagnosis Date  . CAD (coronary artery disease) October 2010    a. 02/2009 NSTEMI/Cath: 3VD; b. 02/2009 CABG x 5: LIMA->LAD, VG->Diag, VG->RI, VG->RVM->RPDA.  Marland Kitchen Hyperlipidemia   . Hypertension   . PVD (peripheral vascular disease) ([place])     a. s/p toe amputations on L foot.  . Bicuspid aortic valve     a. 02/2009 s/p tissue AVR;  b. 07/2014 Echo: EF 55-60%, basal-mid inf HK, mild AS/MR, mod dil LA, PASP 44 mmHg.  Marland Kitchen GERD (gastroesophageal reflux disease)   . Diabetes mellitus     Type II  . Heart murmur     Past Surgical History  Procedure Laterality Date  . Amputation      Left great and second toes 2011  . Amputation      Left great and second toes 2011  . Aortic valve replacement      Pericardial tissue valve  . Coronary artery bypass graft  10  . Amputation  07/27/2011    Procedure: AMPUTATION FOOT;   Surgeon: Newt Minion, MD;  Location: [facility];  Service: Orthopedics;  Laterality: Left;  Left Midfoot Amputation   . Amputation Left 12/08/2014    Procedure: Left Below Knee Amputation;  Surgeon: Newt Minion, MD;  Location: [facility];  Service: Orthopedics;  Laterality: Left;    There were no vitals filed for this visit.  Visit Diagnosis:  Unsteadiness  Balance problems  Abnormality of gait  Weakness generalized  Status post below knee amputation of left lower extremity ([place])  Encounter for prosthetic gait training      Subjective Assessment - 05/10/15 0848    Subjective Prosthetist added pads and it feels better. Wearing all awake hours with no issues.    Currently in Pain? No/denies            [facility] PT Assessment - 05/10/15 0845    Berg Balance Test   Sit to Stand Able to stand without using hands and stabilize independently   Standing Unsupported Able to stand safely 2 minutes   Sitting with Back Unsupported but Feet Supported on Floor or Stool Able to sit safely and securely 2 minutes   Stand to Sit Sits safely with minimal use of hands   Transfers Able to transfer safely, minor use of hands   Standing Unsupported with Eyes  Closed Able to stand 10 seconds safely   Standing Ubsupported with Feet Together Able to place feet together independently and stand 1 minute safely   From Standing, Reach Forward with Outstretched Arm Can reach forward >12 cm safely (5")   From Standing Position, Pick up Object from [place] to pick up shoe, needs supervision   From Standing Position, Turn to Look Behind Over each Shoulder Looks behind from both sides and weight shifts well   Turn 360 Degrees Able to turn 360 degrees safely one side only in 4 seconds or less   Standing Unsupported, Alternately Place Feet on Step/Stool Able to stand independently and safely and complete 8 steps in 20 seconds   Standing Unsupported, One Foot in [place] to take small step independently and hold 30  seconds   Standing on One Leg Tries to lift leg/unable to hold 3 seconds but remains standing independently   Total Score 48   Berg commentLinus Orn 11/56         Prosthetics Assessment - 05/10/15 [place] with Skin check;Residual limb care;Prosthetic cleaning;Ply sock cleaning;Correct ply sock adjustment;Proper wear schedule/adjustment;Proper weight-bearing schedule/adjustment   Current prosthetic wear tolerance (#hours/day)  >90% of awake hours  drying as needed per pt report   Current prosthetic weight-bearing tolerance (hours/day)  tolerated 30 minutes of standing / gait with no pain or other issues.    Edema none                    [facility] Adult PT Treatment/Exercise - 05/10/15 0845    Transfers   Sit to Stand 6: Modified independent (Device/Increase time);From chair/3-in-1;Without upper extremity assist   Stand to Sit 6: Modified independent (Device/Increase time);To chair/3-in-1;Without upper extremity assist   Ambulation/Gait   Ambulation/Gait Yes   Ambulation/Gait Assistance 6: Modified independent (Device/Increase time)   Ambulation Distance (Feet) 1100 Feet   Assistive device Prosthesis;None   Gait Pattern Step-through pattern   Ambulation Surface Indoor;Level   Stairs Yes   Stairs Assistance 6: Modified independent (Device/Increase time)   Stair Management Technique One rail Right;Alternating pattern;Forwards   Number of Stairs 4   Ramp 6: Modified independent (Device)  prosthesis only   Curb 6: Modified independent (Device/increase time)  prosthesis only   High Level Balance   High Level Balance Activities --   High Level Balance Comments --   Prosthetics   Current prosthetic wear tolerance (days/week)  daily   Residual limb condition  intact   Education Provided --   Donning Prosthesis Independent   Doffing Prosthesis Independent                  PT Short Term Goals - 04/13/15 1215    PT  SHORT TERM GOAL #1   Title patient tolerates wear of prosthesis >10 hrs total per day with no new skin issues, current wound healing and no tenderness. (Target Date: 04/15/2015)   Baseline Assessed: 04/11/15. Pt is wearing prosthesis 12 hrs per day, but does have some skin continuing to heal and reported excess pressure today from incorrect sock ply.   Time 1   Period Months   Status Partially Met   PT SHORT TERM GOAL #2   Title Patient verbalizes & demonstrates proper donning, cleaning and skin checking.  (Target Date: 04/15/2015)   Baseline Achieved: 04/11/15   Time 1   Period Months   Status Achieved   PT SHORT TERM GOAL #  3   Title [employer] >/= 20 /56.  (Target Date: 04/15/2015)   Baseline Achieved: 04/11/15. Pt scored 38/56 on BERG.   Time 1   Period Months   Status Achieved   PT SHORT TERM GOAL #4   Title Patient ambulates 200' with single point cane & prosthesis with supervision. (Target Date: 04/15/2015)   Baseline Achieved: 04/11/15. Pt supervision due to foot slap on R and general instability.   Time 1   Period Months   Status Achieved   PT SHORT TERM GOAL #5   Title Patient negotiates ramps, curbs & stairs (1 rail) with single point cane & prosthesis with minA. (Target Date: 04/15/2015)   Baseline Achieved: 04/11/15. Pt min guard assist with curb due to general instability with ascent. Pt min guard with ramp. Pt supervision with 4 steps in reciprocal pattern.   Time 1   Period Months   Status Achieved           PT Long Term Goals - 05/10/15 0845    PT LONG TERM GOAL #1   Title Pateint verbalizes and demonstrates proper prosthetic care to enable safe use of prosthesis. (Target Date: 05/12/2015)   Baseline MET 05/10/2015   Time 10   Period Weeks   Status Achieved   PT LONG TERM GOAL #2   Title Patient tolerates daily wear of prosthesis >90% of awake hours without skin issues or limb pain to enable potential for function throughout his day.  (Target Date: 05/12/2015)    Baseline MET 05/10/2015   Time 10   Period Weeks   Status Achieved   PT LONG TERM GOAL #3   Title Patient ambulates 100' around furniture with prosthesis only carrying household item modified independent.  (Target Date: 05/12/2015)   Baseline MET 05/10/2015   Time 10   Period Weeks   Status Achieved   PT LONG TERM GOAL #4   Title Patient ambulates 1000' with single point cane and prosthesis on outdoor surfaces modified independent.  (Target Date: 05/12/2015)   Baseline MET 05/10/2015 with prosthesis only   Time 10   Period Weeks   Status Achieved   PT LONG TERM GOAL #5   Title Patient negotiates ramps, curbs and stairs (1 rail) with single point cane and prosthesis modified independent.  (Target Date: 05/12/2015)   Baseline MET 05/10/2015   Time 10   Period Weeks   Status Achieved   PT LONG TERM GOAL #6   Title Berg Balance >/= 36/56 to decrease risk of falls. (Target Date: 05/12/2015)   Baseline MET 05/10/2015  Berg Balance 48/56   Time 10   Period Months   Status Achieved               Plan - 05/10/15 0929    Clinical Impression Statement Patient met all LTGs established at evaluation. He can ambulate full community distances but at fixed cadence. (K2 level). He improved Berg Balance from 11/56 to 48/56 which still indicates moderate fall risk but significantly improved.    Pt will benefit from skilled therapeutic intervention in order to improve on the following deficits Abnormal gait;Decreased activity tolerance;Decreased balance;Decreased endurance;Decreased mobility;Decreased strength;Prosthetic Dependency;Postural dysfunction   Rehab Potential Good   PT Frequency 2x / week   PT Duration Other (comment)  10 weeks   PT Treatment/Interventions ADLs/Self Care Home Management;Functional mobility training;Stair training;Gait training;Therapeutic activities;Therapeutic exercise;DME Instruction;Balance training;Neuromuscular re-education;Patient/family education;Prosthetic Training;Orthotic  Fit/Training   PT Next Visit Plan Discharge PT   Consulted and Agree  with Plan of Care Patient        Problem List Patient Active Problem List   Diagnosis Date Noted  . Below knee amputation status ([place]) 12/08/2014  . Bicuspid aortic valve   . Hyperlipidemia   . Hypertension   . Greater trochanter fracture ([place]) 07/28/2014  . Hip fracture, right ([place]) 07/28/2014  . Diabetes mellitus without complication ([place]) 33/82/5053  . HLD (hyperlipidemia) 07/28/2014  . GERD (gastroesophageal reflux disease) 07/28/2014  . Hip fracture ([place]) 07/28/2014  . Fall 07/28/2014  . Leukocytosis 07/28/2014  . Thrombocytopenia ([place]) 07/28/2014  . H/O aortic valve replacement 10/16/2011  . 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 PLEURAL EFFUSION 05/25/2009  . OPEN WOUND OF CHEST , COMPLICATED 97/67/3419  . PROSTHETIC VALVE - BIO/ ENDOPROSTHESIS 05/25/2009  . DM 04/19/2009  . HYPERLIPIDEMIA 04/19/2009  . Essential hypertension 04/19/2009  . CAD (coronary artery disease) 04/19/2009  . Peripheral vascular disease ([place]) 04/19/2009  . OSTEOMYELITIS 04/19/2009  . BICUSPID AORTIC VALVE 04/19/2009    Melinda Pottinger PT, DPT 05/10/2015, 9:33 AM  [facility] [address] [place], [place], [address] Phone: [phone]   Fax:  [phone]  Name: [name] MRN: [id number] Date of Birth: 1955/06/01

## 2015-05-11 ENCOUNTER — Encounter: Payer: BLUE CROSS/BLUE SHIELD | Admitting: Physical Therapy

## 2015-05-12 ENCOUNTER — Encounter: Payer: BLUE CROSS/BLUE SHIELD | Admitting: Physical Therapy

## 2015-06-12 ENCOUNTER — Other Ambulatory Visit: Payer: Self-pay | Admitting: Physician Assistant

## 2015-07-13 ENCOUNTER — Other Ambulatory Visit: Payer: Self-pay | Admitting: Physician Assistant

## 2015-09-29 ENCOUNTER — Other Ambulatory Visit: Payer: Self-pay | Admitting: Physician Assistant

## 2015-12-30 ENCOUNTER — Other Ambulatory Visit: Payer: Self-pay | Admitting: Physician Assistant

## 2016-01-30 ENCOUNTER — Other Ambulatory Visit: Payer: Self-pay | Admitting: Physician Assistant

## 2016-04-01 ENCOUNTER — Other Ambulatory Visit: Payer: Self-pay | Admitting: Physician Assistant

## 2016-04-20 ENCOUNTER — Encounter (INDEPENDENT_AMBULATORY_CARE_PROVIDER_SITE_OTHER): Payer: Self-pay | Admitting: Family

## 2016-04-20 ENCOUNTER — Ambulatory Visit (INDEPENDENT_AMBULATORY_CARE_PROVIDER_SITE_OTHER): Payer: BLUE CROSS/BLUE SHIELD | Admitting: Family

## 2016-04-20 ENCOUNTER — Ambulatory Visit (INDEPENDENT_AMBULATORY_CARE_PROVIDER_SITE_OTHER): Payer: Self-pay | Admitting: Orthopedic Surgery

## 2016-04-20 DIAGNOSIS — Z89512 Acquired absence of left leg below knee: Secondary | ICD-10-CM

## 2016-04-20 DIAGNOSIS — L03116 Cellulitis of left lower limb: Secondary | ICD-10-CM

## 2016-04-20 NOTE — Progress Notes (Signed)
Office Visit Note   Patient: [name]           Date of Birth: [birth year]           MRN: [id number] Visit Date: 04/20/2016              Requested by: [name] [name] [address] [place], [place] [address] PCP: [name]   Assessment & Plan: Visit Diagnoses:  1. Cellulitis of left lower extremity   2. Status post below knee amputation of left lower extremity (HCC)     Plan: Continue with Bactrim and Keflex. Will follow him closely. Follow up in 1 more week. Will follow with prosthetist for modifications to socket.   Follow-Up Instructions: Return in about 1 week (around 04/27/2016).   Orders:  No orders of the defined types were placed in this encounter.  No orders of the defined types were placed in this encounter.     Procedures: No procedures performed   Clinical Data: No additional findings.   Subjective: Chief Complaint  Patient presents with  . Left Knee - Pain    Patient is a 60 year old gentleman who presents for evaluation of left knee infection. There is redness and soreness. There is no skin breakdown or ulceration. The patient saw PCP on Wednesday and placed on keflex 500mg  1 po tid x 10days, and sulfa 800mg  1 po bid x 10 days for cellulitis.    Review of Systems  Constitutional: Negative for chills and fever.  Skin: Positive for color change. Negative for wound.     Objective: Vital Signs: There were no vitals taken for this visit.  Physical Exam  Constitutional: He is oriented to person, place, and time. He appears well-developed and well-nourished.  Pulmonary/Chest: Effort normal.  Neurological: He is alert and oriented to person, place, and time.  Skin:     Psychiatric: He has a normal mood and affect.  Nursing note reviewed.   Ortho Exam  Specialty Comments:  No specialty comments available.  Imaging: No results found.   PMFS History: Patient Active Problem  List   Diagnosis Date Noted  . Below knee amputation status ([place]) 12/08/2014  . Bicuspid aortic valve   . Hyperlipidemia   . Hypertension   . Greater trochanter fracture ([place]) 07/28/2014  . Hip fracture, right ([place]) 07/28/2014  . Diabetes mellitus without complication ([place]) 56/70/1410  . HLD (hyperlipidemia) 07/28/2014  . GERD (gastroesophageal reflux disease) 07/28/2014  . Hip fracture ([place]) 07/28/2014  . Fall 07/28/2014  . Leukocytosis 07/28/2014  . Thrombocytopenia ([place]) 07/28/2014  . H/O aortic valve replacement 10/16/2011  . 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 PLEURAL EFFUSION 05/25/2009  . OPEN WOUND OF CHEST , COMPLICATED 30/13/1438  . PROSTHETIC VALVE - BIO/ ENDOPROSTHESIS 05/25/2009  . DM 04/19/2009  . HYPERLIPIDEMIA 04/19/2009  . Essential hypertension 04/19/2009  . CAD (coronary artery disease) 04/19/2009  . Peripheral vascular disease ([place]) 04/19/2009  . OSTEOMYELITIS 04/19/2009  . BICUSPID AORTIC VALVE 04/19/2009   Past Medical History:  Diagnosis Date  . Bicuspid aortic valve    a. 02/2009 s/p tissue AVR;  b. 07/2014 Echo: EF 55-60%, basal-mid inf HK, mild AS/MR, mod dil LA, PASP 44 mmHg.  Marland Kitchen CAD (coronary artery disease) October 2010   a. 02/2009 NSTEMI/Cath: 3VD; b. 02/2009 CABG x 5: LIMA->LAD, VG->Diag, VG->RI, VG->RVM->RPDA.  . Diabetes mellitus    Type II  . GERD (gastroesophageal reflux disease)   .  Heart murmur   . Hyperlipidemia   . Hypertension   . PVD (peripheral vascular disease) ([place])    a. s/p toe amputations on L foot.    Family History  Problem Relation Age of Onset  . Heart attack Father     early age  . Heart disease Father   . Stroke Father   . Heart attack Brother     At age 31  . Stroke Brother   . Diabetes type II Mother   . Diabetes type II Sister   . Diabetes    . Coronary artery disease      Past Surgical History:  Procedure Laterality Date  . AMPUTATION     Left great and second toes 2011  . AMPUTATION     Left  great and second toes 2011  . AMPUTATION  07/27/2011   Procedure: AMPUTATION FOOT;  Surgeon: Raymond Minion, King;  Location: [facility];  Service: Orthopedics;  Laterality: Left;  Left Midfoot Amputation   . AMPUTATION Left 12/08/2014   Procedure: Left Below Knee Amputation;  Surgeon: Raymond Minion, King;  Location: [facility];  Service: Orthopedics;  Laterality: Left;  . AORTIC VALVE REPLACEMENT     Pericardial tissue valve  . CORONARY ARTERY BYPASS GRAFT  10   Social History   Occupational History  . [place] History Main Topics  . Smoking status: Former Smoker    Years: 1.00    Quit date: 10/15/2000  . Smokeless tobacco: Not on file  . Alcohol use No     Comment: Rarely  . Drug use: No  . Sexual activity: Not on file

## 2016-04-26 ENCOUNTER — Ambulatory Visit (INDEPENDENT_AMBULATORY_CARE_PROVIDER_SITE_OTHER): Payer: BLUE CROSS/BLUE SHIELD | Admitting: Orthopedic Surgery

## 2016-04-26 ENCOUNTER — Encounter (INDEPENDENT_AMBULATORY_CARE_PROVIDER_SITE_OTHER): Payer: Self-pay | Admitting: Orthopedic Surgery

## 2016-04-26 DIAGNOSIS — Z89512 Acquired absence of left leg below knee: Secondary | ICD-10-CM

## 2016-04-26 DIAGNOSIS — L03116 Cellulitis of left lower limb: Secondary | ICD-10-CM | POA: Diagnosis not present

## 2016-04-26 HISTORY — DX: Cellulitis of left lower limb: L03.116

## 2016-04-26 NOTE — Progress Notes (Signed)
Office Visit Note   Patient: [name]           Date of Birth: Mar 17, 1956           MRN: [id number] Visit Date: 04/26/2016              Requested by: Raymond General, MD [name] [address] [place],  [address] PCP: Raymond Alstrom, MD   Assessment & Plan: Visit Diagnoses:  1. Status post below knee amputation of left lower extremity ([place])   2. Cellulitis of left lower extremity     Plan: Patient still has some mild bursitis over the patella on the left secondary to subsidence in his socket. He is currently wearing 9 plicae [name] and will follow up with [employer] today. There is no signs of infection at this time and will follow-up as needed.  Follow-Up Instructions: Return if symptoms worsen or fail to improve.   Orders:  No orders of the defined types were placed in this encounter.  No orders of the defined types were placed in this encounter.     Procedures: No procedures performed   Clinical Data: No additional findings.   Subjective: Chief Complaint  Patient presents with  . Left Leg - Follow-up    [name] is here to follow up on his left below knee amputation.  He states that the knee cap is sore now, otherwise he is doing fine.    Review of Systems   Objective: Vital Signs: There were no vitals taken for this visit.  Physical Exam examination patient is alert oriented no adenopathy well-dressed normal affect normal respiratory effort. He does have an antalgic gait.. Patient developed some bursitis and abrasions proximally secondary subsidence in his prosthesis. Examination medially and laterally there are no open ulcers there are no N bearing ulcers or calluses. Patient does have some mild bursitis over the patella. There is no effusion no signs of infection.  Ortho Exam  Specialty Comments:  No specialty comments available.  Imaging: No results found.   PMFS History: Patient Active Problem  List   Diagnosis Date Noted  . Cellulitis of left lower extremity 04/26/2016  . Below knee amputation status ([name]) 12/08/2014  . Bicuspid aortic valve   . Hyperlipidemia   . Hypertension   . Greater trochanter fracture ([place]) 07/28/2014  . Hip fracture, right ([place]) 07/28/2014  . Diabetes mellitus without complication ([place]) 81/05/7508  . HLD (hyperlipidemia) 07/28/2014  . GERD (gastroesophageal reflux disease) 07/28/2014  . Hip fracture ([place]) 07/28/2014  . Fall 07/28/2014  . Leukocytosis 07/28/2014  . Thrombocytopenia ([place]) 07/28/2014  . H/O aortic valve replacement 10/16/2011  . 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 PLEURAL EFFUSION 05/25/2009  . OPEN WOUND OF CHEST , COMPLICATED 25/85/2778  . PROSTHETIC VALVE - BIO/ ENDOPROSTHESIS 05/25/2009  . DM 04/19/2009  . HYPERLIPIDEMIA 04/19/2009  . Essential hypertension 04/19/2009  . CAD (coronary artery disease) 04/19/2009  . Peripheral vascular disease ([place]) 04/19/2009  . OSTEOMYELITIS 04/19/2009  . BICUSPID AORTIC VALVE 04/19/2009   Past Medical History:  Diagnosis Date  . Bicuspid aortic valve    a. 02/2009 s/p tissue AVR;  b. 07/2014 Echo: EF 55-60%, basal-mid inf HK, mild AS/MR, mod dil LA, PASP 44 mmHg.  Marland Kitchen CAD (coronary artery disease) [name] 2010   a. 02/2009 NSTEMI/Cath: 3VD; b. 02/2009 CABG x 5: LIMA->LAD, VG->Diag, VG->RI, VG->RVM->RPDA.  . Diabetes mellitus    Type II  . GERD (gastroesophageal reflux disease)   .  Heart murmur   . Hyperlipidemia   . Hypertension   . PVD (peripheral vascular disease) ([place])    a. s/p toe amputations on L foot.    Family History  Problem Relation Age of Onset  . Heart attack Father     early age  . Heart disease Father   . Stroke Father   . Heart attack Brother     At age 71  . Stroke Brother   . Diabetes type II Mother   . Diabetes type II Sister   . Diabetes    . Coronary artery disease      Past Surgical History:  Procedure Laterality Date  . AMPUTATION     Left  great and second toes 2011  . AMPUTATION     Left great and second toes 2011  . AMPUTATION  07/27/2011   Procedure: AMPUTATION FOOT;  Surgeon: Newt Minion, MD;  Location: [facility];  Service: Orthopedics;  Laterality: Left;  Left Midfoot Amputation   . AMPUTATION Left 12/08/2014   Procedure: Left Below Knee Amputation;  Surgeon: Newt Minion, MD;  Location: [facility];  Service: Orthopedics;  Laterality: Left;  . AORTIC VALVE REPLACEMENT     Pericardial tissue valve  . CORONARY ARTERY BYPASS GRAFT  10   Social History   Occupational History  . [place] History Main Topics  . Smoking status: Former Smoker    Years: 1.00    Quit date: 10/15/2000  . Smokeless tobacco: Not on file  . Alcohol use No     Comment: Rarely  . Drug use: No  . Sexual activity: Not on file

## 2016-10-28 ENCOUNTER — Other Ambulatory Visit: Payer: Self-pay | Admitting: Physician Assistant

## 2017-03-18 ENCOUNTER — Encounter (INDEPENDENT_AMBULATORY_CARE_PROVIDER_SITE_OTHER): Payer: Self-pay | Admitting: Orthopedic Surgery

## 2017-03-18 ENCOUNTER — Ambulatory Visit (INDEPENDENT_AMBULATORY_CARE_PROVIDER_SITE_OTHER): Payer: BLUE CROSS/BLUE SHIELD | Admitting: Orthopedic Surgery

## 2017-03-18 DIAGNOSIS — Z89512 Acquired absence of left leg below knee: Secondary | ICD-10-CM

## 2017-03-18 DIAGNOSIS — M720 Palmar fascial fibromatosis [Dupuytren]: Secondary | ICD-10-CM

## 2017-03-18 NOTE — Progress Notes (Signed)
Office Visit Note   Patient: [name]           Date of Birth: 1955-09-01           MRN: [id number] Visit Date: 03/18/2017              Requested by: [name], [place] [address] [place], [place] [address] PCP: [name], MD  No chief complaint on file.     HPI: Patient is a 61 year old gentleman with a left transtibial amputation who has a loose fitting prosthesis does not have rotational control has been at risk of falling due to the loose fitting prosthesis.  Patient also complains of increasing contractures in both hands right worse than left.  He states that he cannot lift small objects that his right hand is worse than his left.  Patient is currently working with [employer] for his prosthesis.  Assessment & Plan: Visit Diagnoses:  1. Status post below knee amputation of left lower extremity ([place])   2. Dupuytren's disease of palm of both hands     Plan: Patient was given a prescription for [employer] for a new socket Her new materials.  This should allow patient to safely ambulate without instability from the prosthesis.  We will have him follow-up with Dr. Burney Gauze for evaluation for surgical intervention for the Dupuytren's contracture right worse than left.  Follow-Up Instructions: Return if symptoms worsen or fail to improve.   Ortho Exam  Patient is alert, oriented, no adenopathy, well-dressed, normal affect, normal respiratory effort. Examination patient is wearing over 15 ply sock on the left he has a prosthesis which is unstable with rotation and varus and valgus is unstable with ambulation.  He has no open ulcers.  Examination of both hands he has Dupuytren's contracture worse on the right than the left he has 70 degrees of flexion contracture of the PIP joint of the right little finger he has palpable cords on the palmar aspect of his hand to go to the ring and little finger.  Left hand also has palpable cords  however I can passively fully extend the MCP and PIP joint of the little and ring finger.  Imaging: No results found. No images are attached to the encounter.  Labs: Lab Results  Component Value Date   HGBA1C 9.9 (H) 07/28/2014   HGBA1C  03/27/2009    6.1 (NOTE) The ADA recommends the following therapeutic goal for glycemic control related to Hgb A1c measurement: Goal of therapy: <6.5 Hgb A1c  Reference: American Diabetes Association: Clinical Practice Recommendations 2010, Diabetes Care, 2010, 33: (Suppl  1).   HGBA1C (H) 02/28/2009    7.2 (NOTE) The ADA recommends the following therapeutic goal for glycemic control related to Hgb A1c measurement: Goal of therapy: <6.5 Hgb A1c  Reference: American Diabetes Association: Clinical Practice Recommendations 2010, Diabetes Care, 2010, 33: (Suppl  1).   ESRSEDRATE 45 (H) 12/08/2008   ESRSEDRATE 54 (H) 11/06/2008   CRP 9.1 (H) 12/08/2008   REPTSTATUS 08/03/2014 FINAL 07/28/2014   GRAMSTAIN  05/02/2010    RARE WBC PRESENT,BOTH PMN AND MONONUCLEAR FEW GRAM POSITIVE COCCI IN PAIRS RARE GRAM NEGATIVE RODS   GRAMSTAIN  05/02/2010    RARE WBC PRESENT,BOTH PMN AND MONONUCLEAR FEW GRAM POSITIVE COCCI IN PAIRS RARE GRAM NEGATIVE RODS   CULT  07/28/2014    NO GROWTH 5 DAYS Performed at [facility] 05/02/2010   LABORGA ENTEROCOCCUS SPECIES 05/02/2010  Orders:  No orders of the defined types were placed in this encounter.  No orders of the defined types were placed in this encounter.    Procedures: No procedures performed  Clinical Data: No additional findings.  ROS:  All other systems negative, except as noted in the HPI. Review of Systems  Objective: Vital Signs: There were no vitals taken for this visit.  Specialty Comments:  No specialty comments available.  PMFS History: Patient Active Problem List   Diagnosis Date Noted  . Dupuytren's disease of palm of both hands 03/18/2017    . Cellulitis of left lower extremity 04/26/2016  . Below knee amputation status ([place]) 12/08/2014  . Bicuspid aortic valve   . Hyperlipidemia   . Hypertension   . Greater trochanter fracture ([place]) 07/28/2014  . Hip fracture, right ([place]) 07/28/2014  . Diabetes mellitus without complication ([place]) 16/11/3708  . HLD (hyperlipidemia) 07/28/2014  . GERD (gastroesophageal reflux disease) 07/28/2014  . Hip fracture ([place]) 07/28/2014  . Fall 07/28/2014  . Leukocytosis 07/28/2014  . Thrombocytopenia ([place]) 07/28/2014  . H/O aortic valve replacement 10/16/2011  . 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 PLEURAL EFFUSION 05/25/2009  . OPEN WOUND OF CHEST , COMPLICATED 62/69/4854  . PROSTHETIC VALVE - BIO/ ENDOPROSTHESIS 05/25/2009  . DM 04/19/2009  . HYPERLIPIDEMIA 04/19/2009  . Essential hypertension 04/19/2009  . CAD (coronary artery disease) 04/19/2009  . Peripheral vascular disease ([place]) 04/19/2009  . OSTEOMYELITIS 04/19/2009  . BICUSPID AORTIC VALVE 04/19/2009   Past Medical History:  Diagnosis Date  . Bicuspid aortic valve    a. 02/2009 s/p tissue AVR;  b. 07/2014 Echo: EF 55-60%, basal-mid inf HK, mild AS/MR, mod dil LA, PASP 44 mmHg.  Marland Kitchen CAD (coronary artery disease) October 2010   a. 02/2009 NSTEMI/Cath: 3VD; b. 02/2009 CABG x 5: LIMA->LAD, VG->Diag, VG->RI, VG->RVM->RPDA.  . Diabetes mellitus    Type II  . GERD (gastroesophageal reflux disease)   . Heart murmur   . Hyperlipidemia   . Hypertension   . PVD (peripheral vascular disease) ([place])    a. s/p toe amputations on L foot.    Family History  Problem Relation Age of Onset  . Heart attack Father        early age  . Heart disease Father   . Stroke Father   . Heart attack Brother        At age 48  . Stroke Brother   . Diabetes type II Mother   . Diabetes type II Sister   . Diabetes Unknown   . Coronary artery disease Unknown     Past Surgical History:  Procedure Laterality Date  . AMPUTATION     Left great and  second toes 2011  . AMPUTATION     Left great and second toes 2011  . AORTIC VALVE REPLACEMENT     Pericardial tissue valve  . CORONARY ARTERY BYPASS GRAFT  10   Social History   Occupational History  . Occupation: Publishing rights manager: [employer]  Tobacco Use  . Smoking status: Former Smoker    Years: 1.00    Last attempt to quit: 10/15/2000    Years since quitting: 16.4  Substance and Sexual Activity  . Alcohol use: No    Comment: Rarely  . Drug use: No  . Sexual activity: Not on file

## 2017-03-18 NOTE — Addendum Note (Signed)
Addended by: Mariana Arn L on: 03/18/2017 04:00 PM   Modules accepted: Orders

## 2017-04-08 DIAGNOSIS — M720 Palmar fascial fibromatosis [Dupuytren]: Secondary | ICD-10-CM | POA: Insufficient documentation

## 2017-04-16 ENCOUNTER — Telehealth (INDEPENDENT_AMBULATORY_CARE_PROVIDER_SITE_OTHER): Payer: Self-pay | Admitting: Orthopedic Surgery

## 2017-04-16 NOTE — Telephone Encounter (Signed)
03/18/2017 OV NOTE FAXED TO Alison Stalling [phone]

## 2017-04-23 DIAGNOSIS — G5620 Lesion of ulnar nerve, unspecified upper limb: Secondary | ICD-10-CM | POA: Insufficient documentation

## 2017-05-07 DIAGNOSIS — N189 Chronic kidney disease, unspecified: Secondary | ICD-10-CM

## 2017-05-07 HISTORY — DX: Chronic kidney disease, unspecified: N18.9

## 2017-12-18 ENCOUNTER — Telehealth (INDEPENDENT_AMBULATORY_CARE_PROVIDER_SITE_OTHER): Payer: Self-pay | Admitting: Orthopedic Surgery

## 2017-12-18 NOTE — Telephone Encounter (Signed)
Received VM from [facility] @ [facility] [employer] checking status of request for records. IC her back [phone] and lm with receptionist that request was processed by [employer] and left ph#

## 2018-01-08 ENCOUNTER — Telehealth (INDEPENDENT_AMBULATORY_CARE_PROVIDER_SITE_OTHER): Payer: Self-pay | Admitting: Orthopedic Surgery

## 2018-01-08 NOTE — Telephone Encounter (Signed)
Ov notes faxed to [employer] [phone]

## 2018-01-14 ENCOUNTER — Other Ambulatory Visit: Payer: Self-pay

## 2018-01-14 ENCOUNTER — Emergency Department (HOSPITAL_COMMUNITY): Payer: BLUE CROSS/BLUE SHIELD

## 2018-01-14 ENCOUNTER — Encounter (HOSPITAL_COMMUNITY): Payer: Self-pay | Admitting: *Deleted

## 2018-01-14 ENCOUNTER — Inpatient Hospital Stay (HOSPITAL_COMMUNITY)
Admission: EM | Admit: 2018-01-14 | Discharge: 2018-01-22 | DRG: 853 | Disposition: A | Discharge status: Skilled Nursing Facility | Payer: BLUE CROSS/BLUE SHIELD | Attending: Internal Medicine | Admitting: Internal Medicine

## 2018-01-14 DIAGNOSIS — R1310 Dysphagia, unspecified: Secondary | ICD-10-CM | POA: Diagnosis present

## 2018-01-14 DIAGNOSIS — Z89512 Acquired absence of left leg below knee: Secondary | ICD-10-CM

## 2018-01-14 DIAGNOSIS — Z89511 Acquired absence of right leg below knee: Secondary | ICD-10-CM

## 2018-01-14 DIAGNOSIS — Z792 Long term (current) use of antibiotics: Secondary | ICD-10-CM

## 2018-01-14 DIAGNOSIS — R6521 Severe sepsis with septic shock: Secondary | ICD-10-CM | POA: Diagnosis present

## 2018-01-14 DIAGNOSIS — E669 Obesity, unspecified: Secondary | ICD-10-CM

## 2018-01-14 DIAGNOSIS — Z79899 Other long term (current) drug therapy: Secondary | ICD-10-CM

## 2018-01-14 DIAGNOSIS — J9811 Atelectasis: Secondary | ICD-10-CM | POA: Diagnosis present

## 2018-01-14 DIAGNOSIS — I330 Acute and subacute infective endocarditis: Secondary | ICD-10-CM | POA: Diagnosis present

## 2018-01-14 DIAGNOSIS — E1159 Type 2 diabetes mellitus with other circulatory complications: Secondary | ICD-10-CM

## 2018-01-14 DIAGNOSIS — M25531 Pain in right wrist: Secondary | ICD-10-CM | POA: Diagnosis present

## 2018-01-14 DIAGNOSIS — Z951 Presence of aortocoronary bypass graft: Secondary | ICD-10-CM

## 2018-01-14 DIAGNOSIS — E11621 Type 2 diabetes mellitus with foot ulcer: Secondary | ICD-10-CM | POA: Diagnosis present

## 2018-01-14 DIAGNOSIS — E1169 Type 2 diabetes mellitus with other specified complication: Secondary | ICD-10-CM | POA: Diagnosis present

## 2018-01-14 DIAGNOSIS — Z89411 Acquired absence of right great toe: Secondary | ICD-10-CM

## 2018-01-14 DIAGNOSIS — D62 Acute posthemorrhagic anemia: Secondary | ICD-10-CM | POA: Diagnosis not present

## 2018-01-14 DIAGNOSIS — A4101 Sepsis due to Methicillin susceptible Staphylococcus aureus: Principal | ICD-10-CM | POA: Diagnosis present

## 2018-01-14 DIAGNOSIS — Z9842 Cataract extraction status, left eye: Secondary | ICD-10-CM

## 2018-01-14 DIAGNOSIS — G8918 Other acute postprocedural pain: Secondary | ICD-10-CM

## 2018-01-14 DIAGNOSIS — E1122 Type 2 diabetes mellitus with diabetic chronic kidney disease: Secondary | ICD-10-CM | POA: Diagnosis present

## 2018-01-14 DIAGNOSIS — K219 Gastro-esophageal reflux disease without esophagitis: Secondary | ICD-10-CM | POA: Diagnosis present

## 2018-01-14 DIAGNOSIS — N182 Chronic kidney disease, stage 2 (mild): Secondary | ICD-10-CM | POA: Diagnosis present

## 2018-01-14 DIAGNOSIS — Z6830 Body mass index (BMI) 30.0-30.9, adult: Secondary | ICD-10-CM

## 2018-01-14 DIAGNOSIS — Y831 Surgical operation with implant of artificial internal device as the cause of abnormal reaction of the patient, or of later complication, without mention of misadventure at the time of the procedure: Secondary | ICD-10-CM | POA: Diagnosis present

## 2018-01-14 DIAGNOSIS — L089 Local infection of the skin and subcutaneous tissue, unspecified: Secondary | ICD-10-CM | POA: Diagnosis present

## 2018-01-14 DIAGNOSIS — Z89421 Acquired absence of other right toe(s): Secondary | ICD-10-CM

## 2018-01-14 DIAGNOSIS — E1152 Type 2 diabetes mellitus with diabetic peripheral angiopathy with gangrene: Secondary | ICD-10-CM | POA: Diagnosis present

## 2018-01-14 DIAGNOSIS — L97513 Non-pressure chronic ulcer of other part of right foot with necrosis of muscle: Secondary | ICD-10-CM | POA: Diagnosis present

## 2018-01-14 DIAGNOSIS — D696 Thrombocytopenia, unspecified: Secondary | ICD-10-CM | POA: Diagnosis present

## 2018-01-14 DIAGNOSIS — A419 Sepsis, unspecified organism: Secondary | ICD-10-CM | POA: Diagnosis present

## 2018-01-14 DIAGNOSIS — Z952 Presence of prosthetic heart valve: Secondary | ICD-10-CM

## 2018-01-14 DIAGNOSIS — Z961 Presence of intraocular lens: Secondary | ICD-10-CM | POA: Diagnosis present

## 2018-01-14 DIAGNOSIS — D735 Infarction of spleen: Secondary | ICD-10-CM | POA: Diagnosis present

## 2018-01-14 DIAGNOSIS — Z9841 Cataract extraction status, right eye: Secondary | ICD-10-CM

## 2018-01-14 DIAGNOSIS — Z993 Dependence on wheelchair: Secondary | ICD-10-CM

## 2018-01-14 DIAGNOSIS — E11649 Type 2 diabetes mellitus with hypoglycemia without coma: Secondary | ICD-10-CM | POA: Diagnosis not present

## 2018-01-14 DIAGNOSIS — K760 Fatty (change of) liver, not elsewhere classified: Secondary | ICD-10-CM | POA: Diagnosis present

## 2018-01-14 DIAGNOSIS — N289 Disorder of kidney and ureter, unspecified: Secondary | ICD-10-CM

## 2018-01-14 DIAGNOSIS — R4182 Altered mental status, unspecified: Secondary | ICD-10-CM | POA: Diagnosis not present

## 2018-01-14 DIAGNOSIS — I1310 Hypertensive heart and chronic kidney disease without heart failure, with stage 1 through stage 4 chronic kidney disease, or unspecified chronic kidney disease: Secondary | ICD-10-CM | POA: Diagnosis present

## 2018-01-14 DIAGNOSIS — N179 Acute kidney failure, unspecified: Secondary | ICD-10-CM | POA: Diagnosis present

## 2018-01-14 DIAGNOSIS — M86071 Acute hematogenous osteomyelitis, right ankle and foot: Secondary | ICD-10-CM

## 2018-01-14 DIAGNOSIS — Z8249 Family history of ischemic heart disease and other diseases of the circulatory system: Secondary | ICD-10-CM

## 2018-01-14 DIAGNOSIS — Z87891 Personal history of nicotine dependence: Secondary | ICD-10-CM

## 2018-01-14 DIAGNOSIS — K8020 Calculus of gallbladder without cholecystitis without obstruction: Secondary | ICD-10-CM | POA: Diagnosis present

## 2018-01-14 DIAGNOSIS — R7881 Bacteremia: Secondary | ICD-10-CM | POA: Insufficient documentation

## 2018-01-14 DIAGNOSIS — I739 Peripheral vascular disease, unspecified: Secondary | ICD-10-CM

## 2018-01-14 DIAGNOSIS — Z833 Family history of diabetes mellitus: Secondary | ICD-10-CM

## 2018-01-14 DIAGNOSIS — E43 Unspecified severe protein-calorie malnutrition: Secondary | ICD-10-CM | POA: Diagnosis present

## 2018-01-14 DIAGNOSIS — I10 Essential (primary) hypertension: Secondary | ICD-10-CM

## 2018-01-14 DIAGNOSIS — L03115 Cellulitis of right lower limb: Secondary | ICD-10-CM | POA: Diagnosis present

## 2018-01-14 DIAGNOSIS — M00031 Staphylococcal arthritis, right wrist: Secondary | ICD-10-CM

## 2018-01-14 DIAGNOSIS — I2510 Atherosclerotic heart disease of native coronary artery without angina pectoris: Secondary | ICD-10-CM | POA: Diagnosis present

## 2018-01-14 DIAGNOSIS — Z79891 Long term (current) use of opiate analgesic: Secondary | ICD-10-CM

## 2018-01-14 DIAGNOSIS — E861 Hypovolemia: Secondary | ICD-10-CM | POA: Diagnosis present

## 2018-01-14 DIAGNOSIS — R6520 Severe sepsis without septic shock: Secondary | ICD-10-CM | POA: Diagnosis present

## 2018-01-14 DIAGNOSIS — Z7982 Long term (current) use of aspirin: Secondary | ICD-10-CM

## 2018-01-14 DIAGNOSIS — Z823 Family history of stroke: Secondary | ICD-10-CM

## 2018-01-14 DIAGNOSIS — B9561 Methicillin susceptible Staphylococcus aureus infection as the cause of diseases classified elsewhere: Secondary | ICD-10-CM | POA: Insufficient documentation

## 2018-01-14 DIAGNOSIS — T826XXA Infection and inflammatory reaction due to cardiac valve prosthesis, initial encounter: Secondary | ICD-10-CM

## 2018-01-14 DIAGNOSIS — Z888 Allergy status to other drugs, medicaments and biological substances status: Secondary | ICD-10-CM

## 2018-01-14 DIAGNOSIS — Z794 Long term (current) use of insulin: Secondary | ICD-10-CM

## 2018-01-14 DIAGNOSIS — I38 Endocarditis, valve unspecified: Secondary | ICD-10-CM

## 2018-01-14 DIAGNOSIS — E785 Hyperlipidemia, unspecified: Secondary | ICD-10-CM | POA: Diagnosis present

## 2018-01-14 DIAGNOSIS — I872 Venous insufficiency (chronic) (peripheral): Secondary | ICD-10-CM | POA: Diagnosis present

## 2018-01-14 DIAGNOSIS — I700 Atherosclerosis of aorta: Secondary | ICD-10-CM | POA: Diagnosis present

## 2018-01-14 DIAGNOSIS — E119 Type 2 diabetes mellitus without complications: Secondary | ICD-10-CM

## 2018-01-14 DIAGNOSIS — I252 Old myocardial infarction: Secondary | ICD-10-CM

## 2018-01-14 HISTORY — DX: Type 2 diabetes mellitus without complications: E11.9

## 2018-01-14 LAB — DIFFERENTIAL
Basophils Absolute: 0 10*3/uL (ref 0.0–0.1)
Basophils Relative: 0 %
Eosinophils Absolute: 0 10*3/uL (ref 0.0–0.7)
Eosinophils Relative: 0 %
Lymphocytes Relative: 2 %
Lymphs Abs: 0.4 10*3/uL — ABNORMAL LOW (ref 0.7–4.0)
Monocytes Absolute: 1.4 10*3/uL — ABNORMAL HIGH (ref 0.1–1.0)
Monocytes Relative: 7 %
Neutro Abs: 17.9 10*3/uL — ABNORMAL HIGH (ref 1.7–7.7)
Neutrophils Relative %: 91 %
WBC Morphology: INCREASED

## 2018-01-14 LAB — COMPREHENSIVE METABOLIC PANEL
ALT: 20 U/L (ref 0–44)
AST: 36 U/L (ref 15–41)
Albumin: 3.8 g/dL (ref 3.5–5.0)
Alkaline Phosphatase: 78 U/L (ref 38–126)
Anion gap: 16 — ABNORMAL HIGH (ref 5–15)
BUN: 19 mg/dL (ref 8–23)
CO2: 19 mmol/L — ABNORMAL LOW (ref 22–32)
Calcium: 9 mg/dL (ref 8.9–10.3)
Chloride: 100 mmol/L (ref 98–111)
Creatinine, Ser: 1.84 mg/dL — ABNORMAL HIGH (ref 0.61–1.24)
GFR calc Af Amer: 44 mL/min — ABNORMAL LOW (ref >60)
GFR calc non Af Amer: 38 mL/min — ABNORMAL LOW (ref >60)
Glucose, Bld: 182 mg/dL — ABNORMAL HIGH (ref 70–99)
Potassium: 3.8 mmol/L (ref 3.5–5.1)
Sodium: 135 mmol/L (ref 135–145)
Total Bilirubin: 1.4 mg/dL — ABNORMAL HIGH (ref 0.3–1.2)
Total Protein: 7.1 g/dL (ref 6.5–8.1)

## 2018-01-14 LAB — CBC
HCT: 47.7 % (ref 39.0–52.0)
Hemoglobin: 15.7 g/dL (ref 13.0–17.0)
MCH: 29 pg (ref 26.0–34.0)
MCHC: 32.9 g/dL (ref 30.0–36.0)
MCV: 88 fL (ref 78.0–100.0)
Platelets: 124 10*3/uL — ABNORMAL LOW (ref 150–400)
RBC: 5.42 MIL/uL (ref 4.22–5.81)
RDW: 13.1 % (ref 11.5–15.5)
WBC: 19.7 10*3/uL — ABNORMAL HIGH (ref 4.0–10.5)

## 2018-01-14 LAB — AMMONIA: Ammonia: 36 umol/L — ABNORMAL HIGH (ref 9–35)

## 2018-01-14 LAB — I-STAT CG4 LACTIC ACID, ED
Lactic Acid, Venous: 5.45 mmol/L (ref 0.5–1.9)
Lactic Acid, Venous: 6.24 mmol/L (ref 0.5–1.9)

## 2018-01-14 LAB — APTT: aPTT: 33 seconds (ref 24–36)

## 2018-01-14 LAB — I-STAT TROPONIN, ED: Troponin i, poc: 0.05 ng/mL (ref 0.00–0.08)

## 2018-01-14 LAB — PROTIME-INR
INR: 1.43
Prothrombin Time: 17.3 seconds — ABNORMAL HIGH (ref 11.4–15.2)

## 2018-01-14 LAB — CBG MONITORING, ED: GLUCOSE-CAPILLARY: 327 mg/dL — AB (ref 70–99)

## 2018-01-14 LAB — ETHANOL: Alcohol, Ethyl (B): <10 mg/dL (ref <10)

## 2018-01-14 LAB — LIPASE, BLOOD: Lipase: 23 U/L (ref 11–51)

## 2018-01-14 MED ORDER — SODIUM CHLORIDE 0.9 % IV BOLUS (SEPSIS)
1000.0000 mL | Freq: Once | INTRAVENOUS | Status: AC
  Administered 2018-01-14: 1000 mL via INTRAVENOUS

## 2018-01-14 MED ORDER — METRONIDAZOLE IN NACL 5-0.79 MG/ML-% IV SOLN
500.0000 mg | Freq: Three times a day (TID) | INTRAVENOUS | Status: DC
  Administered 2018-01-14 – 2018-01-15 (×2): 500 mg via INTRAVENOUS
  Filled 2018-01-14 (×2): qty 100

## 2018-01-14 MED ORDER — IOPAMIDOL (ISOVUE-300) INJECTION 61%
100.0000 mL | Freq: Once | INTRAVENOUS | Status: AC | PRN
  Administered 2018-01-14: 100 mL via INTRAVENOUS

## 2018-01-14 MED ORDER — SODIUM CHLORIDE 0.9 % IV SOLN
2.0000 g | INTRAVENOUS | Status: DC
  Administered 2018-01-14: 2 g via INTRAVENOUS
  Filled 2018-01-14: qty 20

## 2018-01-14 MED ORDER — IOPAMIDOL (ISOVUE-300) INJECTION 61%
INTRAVENOUS | Status: AC
  Filled 2018-01-14: qty 100

## 2018-01-14 MED ORDER — ONDANSETRON HCL 4 MG/2ML IJ SOLN
4.0000 mg | Freq: Once | INTRAMUSCULAR | Status: AC
  Administered 2018-01-14: 4 mg via INTRAVENOUS
  Filled 2018-01-14: qty 2

## 2018-01-14 NOTE — ED Notes (Addendum)
Pt unable to void at this time. PA, [place], notified.

## 2018-01-14 NOTE — ED Notes (Signed)
Blood cultures collected , Rocephin  and Flagyl IV antibiotics infusing .

## 2018-01-14 NOTE — ED Provider Notes (Signed)
Patient placed in Quick Look pathway, seen and evaluated   Chief Complaint: Vomiting, altered mental status  HPI:   Patient presents with 2-day history of decreased appetite, nausea yesterday with 2 episodes of NBNS emesis, 5 episodes today. Lower abdominal pain, intermittent.  Denies chest pain or shortness of breath but he has increased work of breathing in the ED today.  He called his niece who noticed that he was behaving strangely and came to take him to the hospital.  She notes that while on route to the hospital the patient was exhibiting some expressive aphasia.  She also notes bleeding from a wound to the right lower extremity.  He notes frontal headache since yesterday.  Denies vision changes.  Niece feels that his speech is somewhat slurred.  ROS: Positive for nausea, vomiting, headache altered mental status, abdominal pain Negative for chest pain, shortness of breath   Physical Exam:   Gen: No distress  Neuro: Awake and Alert  Skin: Warm    Focused Exam: Tachycardic, lungs clear to auscultation.  Patient is mildly tachypneic.  Abdomen is soft and nontender.  Cranial nerves II through XII tested and intact.  Sensation intact to soft touch of face and extremities.  Good grip strength bilaterally.  No pronator drift.  He does exhibit some intermittent aphasia.  At times he is answering questions appropriately and other times he attempts to answer questions but does not make any sense.  Wound noted to the distal right lower extremity, lateral aspect of the right foot and anterior right lower extremely.  No drainage or significant erythema or tenderness to palpation.  1+ DP/PT pulses of the right lower extremity.  Left AKA.   Initiation of care has begun. The patient has been counseled on the process, plan, and necessity for staying for the completion/evaluation, and the remainder of the medical screening examination    Debroah Baller 01/14/18 Harlan Stains, MD 01/15/18  [phone]

## 2018-01-14 NOTE — ED Notes (Signed)
IV NS bolus infusing , IV sites intact , phlebotomist at bedside collecting blood cultures , nurse asked PA on antibiotic to be administered.

## 2018-01-14 NOTE — ED Provider Notes (Signed)
[facility] DEPARTMENT Provider Note   CSN: [id number] Arrival date & time: 01/14/18  1826     History   Chief Complaint Chief Complaint  Patient presents with  . Emesis  . Altered Mental Status    HPI [name] is a 62 y.o. male with past medical history of CAD, diabetes, hyperlipidemia, hypertension, presenting to the ED with his niece with complaint of altered mental status, nausea, and vomiting.  He states he had nausea and vomiting that began last night and continued today.  Reports some abdominal pain as well with decreased appetite.  Denies known fever, chest pain, shortness of breath.  His niece states he was acting abnormal and confused intermittently today.  The history is provided by the patient and a relative.    Past Medical History:  Diagnosis Date  . Bicuspid aortic valve    a. 02/2009 s/p tissue AVR;  b. 07/2014 Echo: EF 55-60%, basal-mid inf HK, mild AS/MR, mod dil LA, PASP 44 mmHg.  Marland Kitchen CAD (coronary artery disease) October 2010   a. 02/2009 NSTEMI/Cath: 3VD; b. 02/2009 CABG x 5: LIMA->LAD, VG->Diag, VG->RI, VG->RVM->RPDA.  . Diabetes mellitus    Type II  . GERD (gastroesophageal reflux disease)   . Heart murmur   . Hyperlipidemia   . Hypertension   . PVD (peripheral vascular disease) ([place])    a. s/p toe amputations on L foot.    Patient Active Problem List   Diagnosis Date Noted  . Dupuytren's disease of palm of both hands 03/18/2017  . Cellulitis of left lower extremity 04/26/2016  . Below knee amputation status ([place]) 12/08/2014  . Bicuspid aortic valve   . Hyperlipidemia   . Hypertension   . Greater trochanter fracture ([place]) 07/28/2014  . Hip fracture, right ([place]) 07/28/2014  . Diabetes mellitus without complication ([place]) 50/35/4656  . HLD (hyperlipidemia) 07/28/2014  . GERD (gastroesophageal reflux disease) 07/28/2014  . Hip fracture ([place]) 07/28/2014  . Fall 07/28/2014  . Leukocytosis 07/28/2014  .  Thrombocytopenia ([place]) 07/28/2014  . H/O aortic valve replacement 10/16/2011  . 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 PLEURAL EFFUSION 05/25/2009  . OPEN WOUND OF CHEST , COMPLICATED 81/27/5170  . PROSTHETIC VALVE - BIO/ ENDOPROSTHESIS 05/25/2009  . DM 04/19/2009  . HYPERLIPIDEMIA 04/19/2009  . Essential hypertension 04/19/2009  . CAD (coronary artery disease) 04/19/2009  . Peripheral vascular disease ([place]) 04/19/2009  . OSTEOMYELITIS 04/19/2009  . BICUSPID AORTIC VALVE 04/19/2009    Past Surgical History:  Procedure Laterality Date  . AMPUTATION     Left great and second toes 2011  . AMPUTATION     Left great and second toes 2011  . AMPUTATION  07/27/2011   Procedure: AMPUTATION FOOT;  Surgeon: Newt Minion, MD;  Location: [facility];  Service: Orthopedics;  Laterality: Left;  Left Midfoot Amputation   . AMPUTATION Left 12/08/2014   Procedure: Left Below Knee Amputation;  Surgeon: Newt Minion, MD;  Location: [facility];  Service: Orthopedics;  Laterality: Left;  . AORTIC VALVE REPLACEMENT     Pericardial tissue valve  . CORONARY ARTERY BYPASS GRAFT  10        Home Medications    Prior to Admission medications   Medication Sig Start Date End Date Taking? Authorizing Provider  aspirin 325 MG tablet Take 325 mg by mouth daily.      [provider]  calcium-vitamin D (OSCAL WITH D) 500-200 MG-UNIT per tablet Take 1 tablet by mouth 2 (  two) times daily.     [provider]  cephALEXin (KEFLEX) 500 MG capsule  04/18/16   [provider]  cetirizine (ZYRTEC) 10 MG tablet Take 10 mg by mouth daily.    [provider]  FARXIGA 10 MG TABS tablet Take 10 mg by mouth daily. 07/23/14   [provider]  glipiZIDE (GLUCOTROL XL) 10 MG 24 hr tablet  04/02/16   [provider]  latanoprost (XALATAN) 0.005 % ophthalmic solution Place 1 drop into both eyes at bedtime. 07/13/14   [provider]  losartan (COZAAR) 50 MG tablet Take 50 mg  by mouth every morning. 07/19/14   [provider]  metoprolol (LOPRESSOR) 50 MG tablet TAKE 2 TABLETS BY MOUTH EVERY MORNING AND 1 EVERY EVENING 12/30/15   Mar Daring, PA-C  Multiple Vitamin (MULTIVITAMIN) tablet Take 1 tablet by mouth daily.      [provider]  naproxen sodium (ANAPROX) 220 MG tablet Take 220 mg by mouth 2 (two) times daily as needed (pain).    [provider]  ondansetron (ZOFRAN) 4 MG tablet Take 4 mg by mouth every 8 (eight) hours as needed for nausea or vomiting.    [provider]  oxyCODONE-acetaminophen (PERCOCET/ROXICET) 5-325 MG per tablet Take 1 tablet by mouth every 4 (four) hours as needed for severe pain. Patient not taking: Reported on 04/26/2016 07/31/14   Janece Canterbury, MD  oxyCODONE-acetaminophen (ROXICET) 5-325 MG per tablet Take 1 tablet by mouth every 4 (four) hours as needed for severe pain. Patient not taking: Reported on 04/26/2016 12/09/14   Newt Minion, MD  pantoprazole (PROTONIX) 40 MG tablet Take 40 mg by mouth daily.      [provider]  silver sulfADIAZINE (SILVADENE) 1 % cream Apply 1 application topically as needed. For wound care 07/13/14   [provider]  simvastatin (ZOCOR) 20 MG tablet Take 20 mg by mouth daily at 6 PM.  08/17/14   [provider]  sulfamethoxazole-trimethoprim (BACTRIM DS,SEPTRA DS) 800-160 MG tablet  04/18/16   [provider]  TANZEUM 50 MG PEN Inject 50 mg into the skin every Monday. 07/23/14   [provider]  TOUJEO SOLOSTAR 300 UNIT/ML SOPN Inject 60 Units into the skin at bedtime. 08/17/14   [provider]  furosemide (LASIX) 40 MG tablet Take 40 mg by mouth 2 (two) times daily.    07/23/11  [provider]    Family History Family History  Problem Relation Age of Onset  . Heart attack Father        early age  . Heart disease Father   . Stroke Father   . Heart attack Brother        At age 31  . Stroke  Brother   . Diabetes type II Mother   . Diabetes type II Sister   . Diabetes Unknown   . Coronary artery disease Unknown     Social History Social History   Tobacco Use  . Smoking status: Former Smoker    Years: 1.00    Last attempt to quit: 10/15/2000    Years since quitting: 17.2  Substance Use Topics  . Alcohol use: No    Comment: Rarely  . Drug use: No     Allergies   Ace inhibitors   Review of Systems Review of Systems  Constitutional: Positive for appetite change. Negative for fever.  Respiratory: Negative for shortness of breath.   Cardiovascular: Negative for chest pain.  Gastrointestinal: Positive for abdominal pain, nausea and vomiting.  Genitourinary: Negative for dysuria and frequency.  Psychiatric/Behavioral: Positive for confusion.  All other systems reviewed and are negative.    Physical Exam Updated Vital Signs BP 101/64   Pulse (!) 117   Temp 98.4 F (36.9 C)   Resp (!) 28   Ht 5\' 10"  (1.778 m)   Wt 99.8 kg   SpO2 95%   BMI 31.57 kg/m   Physical Exam  Constitutional: He is oriented to person, place, and time. He appears well-developed and well-nourished. No distress.  Patient talking about plugging in the refrigerator when asked what brought him to the ED though answering pointed questions appropriately as well as orientation questions.  HENT:  Head: Normocephalic and atraumatic.  Eyes: Pupils are equal, round, and reactive to light. Conjunctivae and EOM are normal.  Neck: Normal range of motion. Neck supple.  Cardiovascular: Regular rhythm and normal heart sounds.  Tachycardic.  Pulmonary/Chest: Effort normal and breath sounds normal. No respiratory distress.  Abdominal: Soft. Bowel sounds are normal. He exhibits no distension and no mass. There is tenderness (Generalized). There is no rebound and no guarding.  Neurological: He is alert and oriented to person, place, and time. No cranial nerve deficit or sensory deficit. He exhibits  normal muscle tone. Coordination normal.  Skin: Skin is warm.  Psychiatric: He has a normal mood and affect. His behavior is normal.  Nursing note and vitals reviewed.    ED Treatments / Results  Labs (all labs ordered are listed, but only abnormal results are displayed) Labs Reviewed  PROTIME-INR - Abnormal; Notable for the following components:      Result Value   Prothrombin Time 17.3 (*)    All other components within normal limits  CBC - Abnormal; Notable for the following components:   WBC 19.7 (*)    Platelets 124 (*)    All other components within normal limits  DIFFERENTIAL - Abnormal; Notable for the following components:   Neutro Abs 17.9 (*)    Lymphs Abs 0.4 (*)    Monocytes Absolute 1.4 (*)    All other components within normal limits  COMPREHENSIVE METABOLIC PANEL - Abnormal; Notable for the following components:   CO2 19 (*)    Glucose, Bld 182 (*)    Creatinine, Ser 1.84 (*)    Total Bilirubin 1.4 (*)    GFR calc non Af Amer 38 (*)    GFR calc Af Amer 44 (*)    Anion gap 16 (*)    All other components within normal limits  AMMONIA - Abnormal; Notable for the following components:   Ammonia 36 (*)    All other components within normal limits  CBG MONITORING, ED - Abnormal; Notable for the following components:   Glucose-Capillary 327 (*)    All other components within normal limits  I-STAT CG4 LACTIC ACID, ED - Abnormal; Notable for the following components:   Lactic Acid, Venous 6.24 (*)    All other components within normal limits  I-STAT CG4 LACTIC ACID, ED - Abnormal; Notable for the following components:   Lactic Acid, Venous 5.45 (*)    All other components within normal limits  CULTURE, BLOOD (ROUTINE X 2)  CULTURE, BLOOD (ROUTINE X 2)  ETHANOL  APTT  LIPASE, BLOOD  RAPID URINE DRUG SCREEN, HOSP PERFORMED  URINALYSIS, ROUTINE W REFLEX MICROSCOPIC  I-STAT TROPONIN, ED    EKG EKG Interpretation  Date/Time:  Tuesday January 14 2018  18:30:20 EDT  Ventricular Rate:  126 PR Interval:  156 QRS Duration: 100 QT Interval:  314 QTC Calculation: 469 R Axis:   -25 Text Interpretation:  Sinus tachycardia Left ventricular hypertrophy with repolarization abnormality Abnormal ECG Confirmed by Gerlene Fee [phone]) on 01/14/2018 10:42:40 PM   Radiology Ct Head Wo Contrast  Result Date: 01/14/2018 CLINICAL DATA:  Unexplained altered level consciousness. Slurred speech. Nausea. Frontal headache. Nausea and vomiting. EXAM: CT HEAD WITHOUT CONTRAST TECHNIQUE: Contiguous axial images were obtained from the base of the skull through the vertex without intravenous contrast. COMPARISON:  07/27/2014 FINDINGS: Brain: No evidence of acute infarction, hemorrhage, hydrocephalus, extra-axial collection, or mass lesion/mass effect. Mild generalized cerebral atrophy, without significant change. Vascular:  No hyperdense vessel or other acute findings. Skull: No evidence of fracture or other significant bone abnormality. Sinuses/Orbits:  No acute findings. Other: None. IMPRESSION: No acute intracranial abnormality.  Mild cerebral atrophy. Electronically Signed   By: Earle Gell M.D.   On: 01/14/2018 20:05   Ct Abdomen Pelvis W Contrast  Result Date: 01/14/2018 CLINICAL DATA:  Acute abdominal pain. EXAM: CT ABDOMEN AND PELVIS WITH CONTRAST TECHNIQUE: Multidetector CT imaging of the abdomen and pelvis was performed using the standard protocol following bolus administration of intravenous contrast. CONTRAST:  123mL ISOVUE-300 IOPAMIDOL (ISOVUE-300) INJECTION 61% COMPARISON:  None. FINDINGS: Lower chest: Motion artifact through the lung bases. Mild dependent atelectasis on the left. Heart appears prominent size. There are coronary artery calcifications. Hepatobiliary: Decreased hepatic density consistent with steatosis. No discrete focal lesion. Hyperdensity at the gallbladder neck may be stones or sludge. No pericholecystic inflammation. No biliary dilatation.  Pancreas: Fatty atrophy.  No ductal dilatation or inflammation. Spleen: Normal in size. Peripheral slightly wedge-shaped low-density in the posterior spleen measures approximately 4 cm in depth. No perisplenic fluid. Adrenals/Urinary Tract: Mild thickening left adrenal gland without dominant nodule. Normal right adrenal gland. No hydronephrosis. Symmetric bilateral perinephric edema. Homogeneous renal enhancement. Possible small exophytic cyst from the mid left kidney versus perinephric edema. Urinary bladder is physiologically distended. No bladder wall thickening. Stomach/Bowel: Small hiatal hernia. Motion artifact and lack of enteric contrast limits detailed bowel assessment. Allowing for this, no evidence of obstruction, inflammation or bowel wall thickening. Normal appendix. Vascular/Lymphatic: Moderate aorto bi-iliac atherosclerosis without aneurysm. Portal vein and mesenteric vessels are patent. No enlarged abdominal or pelvic lymph nodes. Reproductive: Prostate is unremarkable. Other: No free air or free fluid. Musculoskeletal: Scoliosis and degenerative change throughout the lumbar spine. Slight flattening of the left femoral head may be chronic. No acute osseous abnormality, particularly, no left rib fractures. IMPRESSION: 1. 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 No perisplenic fluid. 2. Symmetric bilateral perinephric edema, likely chronic but can be seen in the setting of urinary tract infection. 3. Gallstones or sludge without gallbladder inflammation on CT. 4. Incidental hepatic steatosis and Aortic Atherosclerosis (ICD10-I70.0). Electronically Signed   By: Keith Rake M.D.   On: 01/14/2018 22:19    Procedures Procedures (including critical care time) CRITICAL CARE Performed by: [place] N Dquan Cortopassi   Total critical care time: 50 minutes  Critical care time was exclusive of separately billable procedures and treating other patients.  Critical care  was necessary to treat or prevent imminent or life-threatening deterioration.  Critical care was time spent personally by me on the following activities: development of treatment plan with patient and/or surrogate as well as nursing, discussions with consultants, evaluation of patient's response to treatment, examination of patient, obtaining history from patient or surrogate, ordering and  performing treatments and interventions, ordering and review of laboratory studies, ordering and review of radiographic studies, pulse oximetry and re-evaluation of patient's condition.   Medications Ordered in ED Medications  cefTRIAXone (ROCEPHIN) 2 g in sodium chloride 0.9 % 100 mL IVPB (0 g Intravenous Stopped 01/14/18 2102)  metroNIDAZOLE (FLAGYL) IVPB 500 mg (0 mg Intravenous Stopped 01/14/18 2122)  ondansetron (ZOFRAN) injection 4 mg (has no administration in time range)  sodium chloride 0.9 % bolus 1,000 mL (0 mLs Intravenous Stopped 01/14/18 2215)    And  sodium chloride 0.9 % bolus 1,000 mL (0 mLs Intravenous Stopped 01/14/18 2216)    And  sodium chloride 0.9 % bolus 1,000 mL (0 mLs Intravenous Stopped 01/14/18 2216)  iopamidol (ISOVUE-300) 61 % injection 100 mL (100 mLs Intravenous Contrast Given 01/14/18 2137)     Initial Impression / Assessment and Plan / ED Course  I have reviewed the triage vital signs and the nursing notes.  Pertinent labs & imaging results that were available during my care of the patient were reviewed by me and considered in my medical decision making (see chart for details).  Clinical Course as of Jan 15 2316  Tue Jan 14, 2018  2006 Pt returned from CT. Initially pt with word salad, not making logical sentences, though is able to answer questions appropriately, oriented x3. Abd with some generalized tenderness, normal neuro exam. Tachycardic and hypotensive on evaluation. Sepsis protocol initiated with 30/kg fluids.   [JR]  2200 Patient reevaluated.  Denies complaints at  this time.  Agreeable to admission.  Pending CT results.   [JR]  2250 CT results discussed with Dr. Sedonia Small.  General surgery consulted   [JR]  2316 Patient discussed with surgery, who recommends no intervention at this time.  Will admit to hospitalist for management of sepsis.   [JR]    Clinical Course User Index [JR] Melissa Tomaselli, [place] N, PA-C   Patient presenting to the ED with altered mental status and complaining of nausea and vomiting.  In triage, labs were obtained and CT head was negative.  Significant lactic acidosis of 6.24. On evaluation, patient has intermittent episodes of confused speech though answers pointed questions appropriately and is oriented x3.  Abdomen is soft with generalized mild tenderness. lungs are clear.  Following commands with normal neurologic exam.  Patient is tachycardic month soft pressures.  No acute leukocytosis noted of 19.7, bicarb is low 19 with anion gap of 16.  Creatinine is also elevated to 1.84.  Code sepsis initiated, 30 mg/kg of fluids administered, IV Flagyl and Rocephin administered.  CT abdomen ordered.  CT showing evidence of splenic infarct and possible signs of UTI.  Discussed results with general surgeon who recommends no intervention at this time.  Patient admitted to hospitalist service.  Patient discussed with and evaluated by Dr. Sedonia Small.  The patient appears reasonably stabilized for admission considering the current resources, flow, and capabilities available in the ED at this time, and I doubt any other [facility] requiring further screening and/or treatment in the ED prior to admission.  Final Clinical Impressions(s) / ED Diagnoses   Final diagnoses:  None    ED Discharge Orders    None       Cato Liburd, [place] N, PA-C 01/15/18 0021    Maudie Flakes, MD 01/15/18 0028

## 2018-01-14 NOTE — ED Triage Notes (Addendum)
Per family and pt, reports pt feeling bad since yesterday. Having headache, chills, n/v and generalized fatigue. Pt having expressive aphasia and unsure of when it started. Pt has wound and redness to right foot.

## 2018-01-14 NOTE — ED Notes (Signed)
Patient transported to CT scan . 

## 2018-01-14 NOTE — ED Notes (Signed)
Patient transported to CT 

## 2018-01-15 ENCOUNTER — Inpatient Hospital Stay (HOSPITAL_COMMUNITY): Payer: BLUE CROSS/BLUE SHIELD

## 2018-01-15 ENCOUNTER — Emergency Department (HOSPITAL_COMMUNITY): Payer: BLUE CROSS/BLUE SHIELD

## 2018-01-15 ENCOUNTER — Encounter (HOSPITAL_COMMUNITY): Payer: Self-pay | Admitting: *Deleted

## 2018-01-15 DIAGNOSIS — E119 Type 2 diabetes mellitus without complications: Secondary | ICD-10-CM | POA: Diagnosis not present

## 2018-01-15 DIAGNOSIS — D696 Thrombocytopenia, unspecified: Secondary | ICD-10-CM

## 2018-01-15 DIAGNOSIS — L97511 Non-pressure chronic ulcer of other part of right foot limited to breakdown of skin: Secondary | ICD-10-CM | POA: Diagnosis not present

## 2018-01-15 DIAGNOSIS — E11621 Type 2 diabetes mellitus with foot ulcer: Secondary | ICD-10-CM

## 2018-01-15 DIAGNOSIS — I739 Peripheral vascular disease, unspecified: Secondary | ICD-10-CM | POA: Diagnosis not present

## 2018-01-15 DIAGNOSIS — B9561 Methicillin susceptible Staphylococcus aureus infection as the cause of diseases classified elsewhere: Secondary | ICD-10-CM

## 2018-01-15 DIAGNOSIS — E1152 Type 2 diabetes mellitus with diabetic peripheral angiopathy with gangrene: Secondary | ICD-10-CM | POA: Diagnosis present

## 2018-01-15 DIAGNOSIS — L089 Local infection of the skin and subcutaneous tissue, unspecified: Secondary | ICD-10-CM

## 2018-01-15 DIAGNOSIS — T826XXA Infection and inflammatory reaction due to cardiac valve prosthesis, initial encounter: Secondary | ICD-10-CM | POA: Diagnosis present

## 2018-01-15 DIAGNOSIS — J9811 Atelectasis: Secondary | ICD-10-CM | POA: Diagnosis present

## 2018-01-15 DIAGNOSIS — Z888 Allergy status to other drugs, medicaments and biological substances status: Secondary | ICD-10-CM

## 2018-01-15 DIAGNOSIS — M00031 Staphylococcal arthritis, right wrist: Secondary | ICD-10-CM

## 2018-01-15 DIAGNOSIS — Z87891 Personal history of nicotine dependence: Secondary | ICD-10-CM

## 2018-01-15 DIAGNOSIS — A4101 Sepsis due to Methicillin susceptible Staphylococcus aureus: Secondary | ICD-10-CM | POA: Diagnosis present

## 2018-01-15 DIAGNOSIS — I5030 Unspecified diastolic (congestive) heart failure: Secondary | ICD-10-CM

## 2018-01-15 DIAGNOSIS — N179 Acute kidney failure, unspecified: Secondary | ICD-10-CM | POA: Diagnosis present

## 2018-01-15 DIAGNOSIS — M86071 Acute hematogenous osteomyelitis, right ankle and foot: Secondary | ICD-10-CM

## 2018-01-15 DIAGNOSIS — I2510 Atherosclerotic heart disease of native coronary artery without angina pectoris: Secondary | ICD-10-CM | POA: Diagnosis present

## 2018-01-15 DIAGNOSIS — E43 Unspecified severe protein-calorie malnutrition: Secondary | ICD-10-CM | POA: Diagnosis present

## 2018-01-15 DIAGNOSIS — Z89411 Acquired absence of right great toe: Secondary | ICD-10-CM

## 2018-01-15 DIAGNOSIS — I96 Gangrene, not elsewhere classified: Secondary | ICD-10-CM

## 2018-01-15 DIAGNOSIS — D72829 Elevated white blood cell count, unspecified: Secondary | ICD-10-CM | POA: Diagnosis not present

## 2018-01-15 DIAGNOSIS — E11649 Type 2 diabetes mellitus with hypoglycemia without coma: Secondary | ICD-10-CM | POA: Diagnosis not present

## 2018-01-15 DIAGNOSIS — R6521 Severe sepsis with septic shock: Secondary | ICD-10-CM | POA: Diagnosis present

## 2018-01-15 DIAGNOSIS — R7881 Bacteremia: Secondary | ICD-10-CM | POA: Insufficient documentation

## 2018-01-15 DIAGNOSIS — Z872 Personal history of diseases of the skin and subcutaneous tissue: Secondary | ICD-10-CM

## 2018-01-15 DIAGNOSIS — R4182 Altered mental status, unspecified: Secondary | ICD-10-CM | POA: Diagnosis present

## 2018-01-15 DIAGNOSIS — I079 Rheumatic tricuspid valve disease, unspecified: Secondary | ICD-10-CM | POA: Diagnosis not present

## 2018-01-15 DIAGNOSIS — E1169 Type 2 diabetes mellitus with other specified complication: Secondary | ICD-10-CM | POA: Diagnosis present

## 2018-01-15 DIAGNOSIS — Z89512 Acquired absence of left leg below knee: Secondary | ICD-10-CM | POA: Diagnosis not present

## 2018-01-15 DIAGNOSIS — I1310 Hypertensive heart and chronic kidney disease without heart failure, with stage 1 through stage 4 chronic kidney disease, or unspecified chronic kidney disease: Secondary | ICD-10-CM | POA: Diagnosis present

## 2018-01-15 DIAGNOSIS — Y831 Surgical operation with implant of artificial internal device as the cause of abnormal reaction of the patient, or of later complication, without mention of misadventure at the time of the procedure: Secondary | ICD-10-CM | POA: Diagnosis present

## 2018-01-15 DIAGNOSIS — T826XXS Infection and inflammatory reaction due to cardiac valve prosthesis, sequela: Secondary | ICD-10-CM | POA: Diagnosis not present

## 2018-01-15 DIAGNOSIS — G8918 Other acute postprocedural pain: Secondary | ICD-10-CM | POA: Diagnosis not present

## 2018-01-15 DIAGNOSIS — R1310 Dysphagia, unspecified: Secondary | ICD-10-CM | POA: Diagnosis present

## 2018-01-15 DIAGNOSIS — Z89421 Acquired absence of other right toe(s): Secondary | ICD-10-CM

## 2018-01-15 DIAGNOSIS — A419 Sepsis, unspecified organism: Secondary | ICD-10-CM | POA: Diagnosis not present

## 2018-01-15 DIAGNOSIS — Z952 Presence of prosthetic heart valve: Secondary | ICD-10-CM | POA: Diagnosis not present

## 2018-01-15 DIAGNOSIS — R6520 Severe sepsis without septic shock: Secondary | ICD-10-CM

## 2018-01-15 DIAGNOSIS — D62 Acute posthemorrhagic anemia: Secondary | ICD-10-CM | POA: Diagnosis not present

## 2018-01-15 DIAGNOSIS — L97519 Non-pressure chronic ulcer of other part of right foot with unspecified severity: Secondary | ICD-10-CM | POA: Diagnosis not present

## 2018-01-15 DIAGNOSIS — L97513 Non-pressure chronic ulcer of other part of right foot with necrosis of muscle: Secondary | ICD-10-CM | POA: Diagnosis present

## 2018-01-15 DIAGNOSIS — I330 Acute and subacute infective endocarditis: Secondary | ICD-10-CM | POA: Diagnosis present

## 2018-01-15 DIAGNOSIS — D735 Infarction of spleen: Secondary | ICD-10-CM | POA: Diagnosis present

## 2018-01-15 DIAGNOSIS — N289 Disorder of kidney and ureter, unspecified: Secondary | ICD-10-CM | POA: Diagnosis not present

## 2018-01-15 DIAGNOSIS — I38 Endocarditis, valve unspecified: Secondary | ICD-10-CM

## 2018-01-15 DIAGNOSIS — Z8249 Family history of ischemic heart disease and other diseases of the circulatory system: Secondary | ICD-10-CM

## 2018-01-15 DIAGNOSIS — Z89511 Acquired absence of right leg below knee: Secondary | ICD-10-CM | POA: Diagnosis not present

## 2018-01-15 DIAGNOSIS — Z951 Presence of aortocoronary bypass graft: Secondary | ICD-10-CM

## 2018-01-15 DIAGNOSIS — K760 Fatty (change of) liver, not elsewhere classified: Secondary | ICD-10-CM | POA: Diagnosis present

## 2018-01-15 DIAGNOSIS — N182 Chronic kidney disease, stage 2 (mild): Secondary | ICD-10-CM | POA: Diagnosis present

## 2018-01-15 DIAGNOSIS — I252 Old myocardial infarction: Secondary | ICD-10-CM | POA: Diagnosis not present

## 2018-01-15 DIAGNOSIS — I10 Essential (primary) hypertension: Secondary | ICD-10-CM | POA: Diagnosis not present

## 2018-01-15 DIAGNOSIS — L03115 Cellulitis of right lower limb: Secondary | ICD-10-CM | POA: Diagnosis present

## 2018-01-15 DIAGNOSIS — E1122 Type 2 diabetes mellitus with diabetic chronic kidney disease: Secondary | ICD-10-CM | POA: Diagnosis present

## 2018-01-15 DIAGNOSIS — Z978 Presence of other specified devices: Secondary | ICD-10-CM | POA: Diagnosis not present

## 2018-01-15 DIAGNOSIS — M25531 Pain in right wrist: Secondary | ICD-10-CM

## 2018-01-15 DIAGNOSIS — Z833 Family history of diabetes mellitus: Secondary | ICD-10-CM

## 2018-01-15 HISTORY — DX: Local infection of the skin and subcutaneous tissue, unspecified: L08.9

## 2018-01-15 LAB — CBC WITH DIFFERENTIAL/PLATELET
BASOS ABS: 0 10*3/uL (ref 0.0–0.1)
BASOS PCT: 0 %
EOS ABS: 0 10*3/uL (ref 0.0–0.7)
Eosinophils Relative: 0 %
HCT: 37.8 % — ABNORMAL LOW (ref 39.0–52.0)
Hemoglobin: 12.1 g/dL — ABNORMAL LOW (ref 13.0–17.0)
LYMPHS PCT: 1 %
Lymphs Abs: 0.2 10*3/uL — ABNORMAL LOW (ref 0.7–4.0)
MCH: 28.7 pg (ref 26.0–34.0)
MCHC: 32 g/dL (ref 30.0–36.0)
MCV: 89.6 fL (ref 78.0–100.0)
MONO ABS: 1.8 10*3/uL — AB (ref 0.1–1.0)
Monocytes Relative: 8 %
NEUTROS PCT: 91 %
Neutro Abs: 21.1 10*3/uL — ABNORMAL HIGH (ref 1.7–7.7)
PLATELETS: 84 10*3/uL — AB (ref 150–400)
RBC: 4.22 MIL/uL (ref 4.22–5.81)
RDW: 13.5 % (ref 11.5–15.5)
WBC: 23.1 10*3/uL — AB (ref 4.0–10.5)

## 2018-01-15 LAB — BLOOD CULTURE ID PANEL (REFLEXED)
Acinetobacter baumannii: NOT DETECTED
CANDIDA ALBICANS: NOT DETECTED
CANDIDA GLABRATA: NOT DETECTED
CANDIDA KRUSEI: NOT DETECTED
CANDIDA PARAPSILOSIS: NOT DETECTED
CANDIDA TROPICALIS: NOT DETECTED
ENTEROBACTER CLOACAE COMPLEX: NOT DETECTED
Enterobacteriaceae species: NOT DETECTED
Enterococcus species: NOT DETECTED
Escherichia coli: NOT DETECTED
Haemophilus influenzae: NOT DETECTED
KLEBSIELLA OXYTOCA: NOT DETECTED
KLEBSIELLA PNEUMONIAE: NOT DETECTED
Listeria monocytogenes: NOT DETECTED
Methicillin resistance: NOT DETECTED
NEISSERIA MENINGITIDIS: NOT DETECTED
PROTEUS SPECIES: NOT DETECTED
Pseudomonas aeruginosa: NOT DETECTED
STREPTOCOCCUS PYOGENES: NOT DETECTED
STREPTOCOCCUS SPECIES: NOT DETECTED
Serratia marcescens: NOT DETECTED
Staphylococcus aureus (BCID): DETECTED — AB
Staphylococcus species: DETECTED — AB
Streptococcus agalactiae: NOT DETECTED
Streptococcus pneumoniae: NOT DETECTED

## 2018-01-15 LAB — GLUCOSE, CAPILLARY
GLUCOSE-CAPILLARY: 224 mg/dL — AB (ref 70–99)
GLUCOSE-CAPILLARY: 246 mg/dL — AB (ref 70–99)
GLUCOSE-CAPILLARY: 135 mg/dL — AB (ref 70–99)
Glucose-Capillary: 239 mg/dL — ABNORMAL HIGH (ref 70–99)
Glucose-Capillary: 85 mg/dL (ref 70–99)

## 2018-01-15 LAB — ECHOCARDIOGRAM COMPLETE
HEIGHTINCHES: 70 in
Weight: 3389.79 oz

## 2018-01-15 LAB — BETA-HYDROXYBUTYRIC ACID: BETA-HYDROXYBUTYRIC ACID: 0.45 mmol/L — AB (ref 0.05–0.27)

## 2018-01-15 LAB — URINALYSIS, ROUTINE W REFLEX MICROSCOPIC
BACTERIA UA: NONE SEEN
BILIRUBIN URINE: NEGATIVE
Glucose, UA: >=500 mg/dL — AB
KETONES UR: NEGATIVE mg/dL
LEUKOCYTES UA: NEGATIVE
NITRITE: NEGATIVE
Protein, ur: 30 mg/dL — AB
Specific Gravity, Urine: 1.045 — ABNORMAL HIGH (ref 1.005–1.030)
pH: 5 (ref 5.0–8.0)

## 2018-01-15 LAB — BASIC METABOLIC PANEL
ANION GAP: 14 (ref 5–15)
BUN: 26 mg/dL — ABNORMAL HIGH (ref 8–23)
CO2: 15 mmol/L — ABNORMAL LOW (ref 22–32)
Calcium: 7.2 mg/dL — ABNORMAL LOW (ref 8.9–10.3)
Chloride: 109 mmol/L (ref 98–111)
Creatinine, Ser: 1.75 mg/dL — ABNORMAL HIGH (ref 0.61–1.24)
GFR calc Af Amer: 47 mL/min — ABNORMAL LOW (ref >60)
GFR, EST NON AFRICAN AMERICAN: 40 mL/min — AB (ref >60)
GLUCOSE: 210 mg/dL — AB (ref 70–99)
POTASSIUM: 4.7 mmol/L (ref 3.5–5.1)
Sodium: 138 mmol/L (ref 135–145)

## 2018-01-15 LAB — RAPID URINE DRUG SCREEN, HOSP PERFORMED
AMPHETAMINES: NOT DETECTED
Barbiturates: NOT DETECTED
Benzodiazepines: NOT DETECTED
COCAINE: NOT DETECTED
OPIATES: NOT DETECTED
Tetrahydrocannabinol: NOT DETECTED

## 2018-01-15 LAB — C-REACTIVE PROTEIN: CRP: 10 mg/dL — ABNORMAL HIGH (ref <1.0)

## 2018-01-15 LAB — COMPREHENSIVE METABOLIC PANEL
ALT: 17 U/L (ref 0–44)
ANION GAP: 10 (ref 5–15)
AST: 32 U/L (ref 15–41)
Albumin: 2.6 g/dL — ABNORMAL LOW (ref 3.5–5.0)
Alkaline Phosphatase: 47 U/L (ref 38–126)
BUN: 24 mg/dL — ABNORMAL HIGH (ref 8–23)
CALCIUM: 7.1 mg/dL — AB (ref 8.9–10.3)
CO2: 15 mmol/L — AB (ref 22–32)
CREATININE: 1.78 mg/dL — AB (ref 0.61–1.24)
Chloride: 111 mmol/L (ref 98–111)
GFR, EST AFRICAN AMERICAN: 46 mL/min — AB (ref >60)
GFR, EST NON AFRICAN AMERICAN: 39 mL/min — AB (ref >60)
Glucose, Bld: 195 mg/dL — ABNORMAL HIGH (ref 70–99)
Potassium: 4.4 mmol/L (ref 3.5–5.1)
SODIUM: 136 mmol/L (ref 135–145)
Total Bilirubin: 0.8 mg/dL (ref 0.3–1.2)
Total Protein: 5 g/dL — ABNORMAL LOW (ref 6.5–8.1)

## 2018-01-15 LAB — LACTIC ACID, PLASMA
Lactic Acid, Venous: 3 mmol/L (ref 0.5–1.9)
Lactic Acid, Venous: 2.4 mmol/L (ref 0.5–1.9)

## 2018-01-15 LAB — PROTIME-INR
INR: 1.72
PROTHROMBIN TIME: 20 s — AB (ref 11.4–15.2)

## 2018-01-15 LAB — HIV ANTIBODY (ROUTINE TESTING W REFLEX): HIV Screen 4th Generation wRfx: NONREACTIVE

## 2018-01-15 LAB — MRSA PCR SCREENING: MRSA BY PCR: NEGATIVE

## 2018-01-15 LAB — CORTISOL: Cortisol, Plasma: 90.1 ug/dL

## 2018-01-15 LAB — CREATININE, URINE, RANDOM: Creatinine, Urine: 117.97 mg/dL

## 2018-01-15 LAB — SODIUM, URINE, RANDOM: Sodium, Ur: <10 mmol/L

## 2018-01-15 LAB — CBG MONITORING, ED: Glucose-Capillary: 172 mg/dL — ABNORMAL HIGH (ref 70–99)

## 2018-01-15 MED ORDER — ACETAMINOPHEN 650 MG RE SUPP: 650.0000 mg | Freq: Four times a day (QID) | RECTAL | Status: DC | PRN

## 2018-01-15 MED ORDER — HYDROCORTISONE NA SUCCINATE PF 100 MG IJ SOLR
50.0000 mg | Freq: Four times a day (QID) | INTRAMUSCULAR | Status: DC
  Administered 2018-01-15: 50 mg via INTRAVENOUS
  Filled 2018-01-15: qty 2

## 2018-01-15 MED ORDER — ONDANSETRON HCL 4 MG/2ML IJ SOLN: 4.0000 mg | Freq: Four times a day (QID) | INTRAMUSCULAR | Status: DC | PRN

## 2018-01-15 MED ORDER — SIMVASTATIN 20 MG PO TABS
20.0000 mg | ORAL_TABLET | Freq: Every day | ORAL | Status: DC
  Administered 2018-01-15 – 2018-01-22 (×8): 20 mg via ORAL
  Filled 2018-01-15 (×8): qty 1

## 2018-01-15 MED ORDER — PERFLUTREN LIPID MICROSPHERE
1.0000 mL | INTRAVENOUS | Status: AC | PRN
  Administered 2018-01-15: 2 mL via INTRAVENOUS
  Filled 2018-01-15: qty 10

## 2018-01-15 MED ORDER — VANCOMYCIN HCL 10 G IV SOLR
1500.0000 mg | Freq: Once | INTRAVENOUS | Status: AC
  Administered 2018-01-15: 1500 mg via INTRAVENOUS
  Filled 2018-01-15 (×2): qty 1500

## 2018-01-15 MED ORDER — ASPIRIN 325 MG PO TABS
325.0000 mg | ORAL_TABLET | Freq: Every day | ORAL | Status: DC
  Administered 2018-01-15 – 2018-01-22 (×7): 325 mg via ORAL
  Filled 2018-01-15 (×7): qty 1

## 2018-01-15 MED ORDER — SODIUM CHLORIDE 0.9 % IV BOLUS
1000.0000 mL | Freq: Once | INTRAVENOUS | Status: AC
  Administered 2018-01-15: 1000 mL via INTRAVENOUS

## 2018-01-15 MED ORDER — INSULIN GLARGINE 100 UNIT/ML ~~LOC~~ SOLN
30.0000 [IU] | Freq: Every day | SUBCUTANEOUS | Status: DC
  Administered 2018-01-15: 30 [IU] via SUBCUTANEOUS
  Filled 2018-01-15 (×2): qty 0.3

## 2018-01-15 MED ORDER — VANCOMYCIN HCL IN DEXTROSE 750-5 MG/150ML-% IV SOLN: 750.0000 mg | Freq: Two times a day (BID) | INTRAVENOUS | Status: DC

## 2018-01-15 MED ORDER — ONDANSETRON HCL 4 MG PO TABS: 4.0000 mg | ORAL_TABLET | Freq: Four times a day (QID) | ORAL | Status: DC | PRN

## 2018-01-15 MED ORDER — HEPARIN SODIUM (PORCINE) 5000 UNIT/ML IJ SOLN
5000.0000 [IU] | Freq: Three times a day (TID) | INTRAMUSCULAR | Status: DC
  Administered 2018-01-15 – 2018-01-22 (×21): 5000 [IU] via SUBCUTANEOUS
  Filled 2018-01-15 (×21): qty 1

## 2018-01-15 MED ORDER — CEFAZOLIN SODIUM-DEXTROSE 2-4 GM/100ML-% IV SOLN
2.0000 g | Freq: Three times a day (TID) | INTRAVENOUS | Status: DC
  Administered 2018-01-15 – 2018-01-16 (×3): 2 g via INTRAVENOUS
  Administered 2018-01-17: 2000 mg via INTRAVENOUS
  Administered 2018-01-17 – 2018-01-22 (×16): 2 g via INTRAVENOUS
  Filled 2018-01-15 (×24): qty 100

## 2018-01-15 MED ORDER — SODIUM CHLORIDE 0.9% FLUSH
3.0000 mL | Freq: Two times a day (BID) | INTRAVENOUS | Status: DC
  Administered 2018-01-15 – 2018-01-21 (×7): 3 mL via INTRAVENOUS

## 2018-01-15 MED ORDER — ACETAMINOPHEN 325 MG PO TABS
650.0000 mg | ORAL_TABLET | Freq: Four times a day (QID) | ORAL | Status: DC | PRN
  Administered 2018-01-15: 650 mg via ORAL
  Filled 2018-01-15: qty 2

## 2018-01-15 MED ORDER — INFLUENZA VAC SPLIT QUAD 0.5 ML IM SUSY
0.5000 mL | PREFILLED_SYRINGE | INTRAMUSCULAR | Status: DC
  Filled 2018-01-15: qty 0.5

## 2018-01-15 MED ORDER — LATANOPROST 0.005 % OP SOLN
1.0000 [drp] | Freq: Every day | OPHTHALMIC | Status: DC
  Administered 2018-01-16 – 2018-01-21 (×5): 1 [drp] via OPHTHALMIC
  Filled 2018-01-15 (×3): qty 2.5

## 2018-01-15 MED ORDER — FENTANYL CITRATE (PF) 100 MCG/2ML IJ SOLN
25.0000 ug | INTRAMUSCULAR | Status: DC | PRN
  Administered 2018-01-16 – 2018-01-17 (×4): 50 ug via INTRAVENOUS
  Administered 2018-01-17: 25 ug via INTRAVENOUS
  Filled 2018-01-15 (×5): qty 2

## 2018-01-15 MED ORDER — CALCIUM CARBONATE-VITAMIN D 500-200 MG-UNIT PO TABS
1.0000 | ORAL_TABLET | Freq: Two times a day (BID) | ORAL | Status: DC
  Administered 2018-01-15 – 2018-01-22 (×14): 1 via ORAL
  Filled 2018-01-15 (×15): qty 1

## 2018-01-15 MED ORDER — ADULT MULTIVITAMIN W/MINERALS CH
1.0000 | ORAL_TABLET | Freq: Every day | ORAL | Status: DC
  Administered 2018-01-15 – 2018-01-22 (×7): 1 via ORAL
  Filled 2018-01-15 (×7): qty 1

## 2018-01-15 MED ORDER — SODIUM CHLORIDE 0.9 % IV SOLN
2.0000 g | INTRAVENOUS | Status: DC
  Administered 2018-01-15: 2 g via INTRAVENOUS
  Filled 2018-01-15: qty 2

## 2018-01-15 MED ORDER — SODIUM CHLORIDE 0.9 % IV SOLN
INTRAVENOUS | Status: AC
  Administered 2018-01-15: 01:00:00 via INTRAVENOUS
  Administered 2018-01-15: 125 mL/h via INTRAVENOUS

## 2018-01-15 MED ORDER — INSULIN ASPART 100 UNIT/ML ~~LOC~~ SOLN
0.0000 [IU] | Freq: Three times a day (TID) | SUBCUTANEOUS | Status: DC
  Administered 2018-01-15 (×2): 3 [IU] via SUBCUTANEOUS
  Administered 2018-01-15: 1 [IU] via SUBCUTANEOUS

## 2018-01-15 MED ORDER — PANTOPRAZOLE SODIUM 40 MG PO TBEC
40.0000 mg | DELAYED_RELEASE_TABLET | Freq: Every day | ORAL | Status: DC
  Administered 2018-01-15 – 2018-01-22 (×7): 40 mg via ORAL
  Filled 2018-01-15 (×7): qty 1

## 2018-01-15 MED ORDER — INSULIN ASPART 100 UNIT/ML ~~LOC~~ SOLN: 0.0000 [IU] | Freq: Every day | SUBCUTANEOUS | Status: DC

## 2018-01-15 MED ORDER — ORAL CARE MOUTH RINSE
15.0000 mL | Freq: Two times a day (BID) | OROMUCOSAL | Status: DC
  Administered 2018-01-15 – 2018-01-16 (×4): 15 mL via OROMUCOSAL

## 2018-01-15 NOTE — H&P (View-Only) (Signed)
ORTHOPAEDIC CONSULTATION  REQUESTING PHYSICIAN: Kipp Brood, MD  Chief Complaint: Gangrenous ulcer lateral aspect right foot.  HPI: [name] is a 62 y.o. male who presents with sepsis with possible infected cardiac valve with a necrotic gangrenous ulcer of the lateral aspect of the right foot with ascending cellulitis.  Patient is status post a left transtibial amputation and ambulates well with a prosthesis on the left.  Patient is 3 years status post left transtibial amputation.  Past Medical History:  Diagnosis Date  . Bicuspid aortic valve    a. 02/2009 s/p tissue AVR;  b. 07/2014 Echo: EF 55-60%, basal-mid inf HK, mild AS/MR, mod dil LA, PASP 44 mmHg.  Marland Kitchen CAD (coronary artery disease) October 2010   a. 02/2009 NSTEMI/Cath: 3VD; b. 02/2009 CABG x 5: LIMA->LAD, VG->Diag, VG->RI, VG->RVM->RPDA.  . Diabetes mellitus    Type II  . GERD (gastroesophageal reflux disease)   . Heart murmur   . Hyperlipidemia   . Hypertension   . PVD (peripheral vascular disease) ([place])    a. s/p toe amputations on L foot.   Past Surgical History:  Procedure Laterality Date  . AMPUTATION     Left great and second toes 2011  . AMPUTATION     Left great and second toes 2011  . AMPUTATION  07/27/2011   Procedure: AMPUTATION FOOT;  Surgeon: Newt Minion, MD;  Location: [facility];  Service: Orthopedics;  Laterality: Left;  Left Midfoot Amputation   . AMPUTATION Left 12/08/2014   Procedure: Left Below Knee Amputation;  Surgeon: Newt Minion, MD;  Location: [place];  Service: Orthopedics;  Laterality: Left;  . AORTIC VALVE REPLACEMENT     Pericardial tissue valve  . CORONARY ARTERY BYPASS GRAFT  10   Social History   Socioeconomic History  . Marital status: Single    Spouse name: Not on file  . Number of children: Not on file  . Years of education: Not on file  . Highest education level: Not on file  Occupational History  . Occupation: Publishing rights manager: [employer]  . Financial resource strain: Not on file  . Food insecurity:    Worry: Not on file    Inability: Not on file  . Transportation needs:    Medical: Not on file    Non-medical: Not on file  Tobacco Use  . Smoking status: Former Smoker    Years: 1.00    Last attempt to quit: 10/15/2000    Years since quitting: 17.2  Substance and Sexual Activity  . Alcohol use: No    Comment: Rarely  . Drug use: No  . Sexual activity: Not on file  Lifestyle  . Physical activity:    Days per week: Not on file    Minutes per session: Not on file  . Stress: Not on file  Relationships  . Social connections:    Talks on phone: Not on file    Gets together: Not on file    Attends religious service: Not on file    Active member of club or organization: Not on file    Attends meetings of clubs or organizations: Not on file    Relationship status: Not on file  Other Topics Concern  . Not on file  Social History Narrative   The patient lives alone in [place].   He is on a diabetic diet with regular exercise at work, but little outside of his  work.   Family History  Problem Relation Age of Onset  . Heart attack Father        early age  . Heart disease Father   . Stroke Father   . Heart attack Brother        At age 64  . Stroke Brother   . Diabetes type II Mother   . Diabetes type II Sister   . Diabetes Unknown   . Coronary artery disease Unknown    - negative except otherwise stated in the family history section Allergies  Allergen Reactions  . Ace Inhibitors Cough   Prior to Admission medications   Medication Sig Start Date End Date Taking? Authorizing Provider  aspirin 325 MG tablet Take 325 mg by mouth daily.     Yes [provider]  calcium-vitamin D (OSCAL WITH D) 500-200 MG-UNIT per tablet Take 1 tablet by mouth 2 (two) times daily.    Yes [provider]  cetirizine (ZYRTEC) 10 MG tablet Take 10 mg by mouth daily.   Yes [provider]    Dulaglutide (TRULICITY) 1.5 IR/6.7EL SOPN Inject 1.5 mg into the skin once a week.   Yes [provider]  FARXIGA 10 MG TABS tablet Take 10 mg by mouth daily. 07/23/14  Yes [provider]  glipiZIDE (GLUCOTROL XL) 10 MG 24 hr tablet Take 10 mg by mouth 2 (two) times daily.  04/02/16  Yes [provider]  latanoprost (XALATAN) 0.005 % ophthalmic solution Place 1 drop into both eyes at bedtime. 07/13/14  Yes [provider]  losartan (COZAAR) 50 MG tablet Take 50 mg by mouth daily.  07/19/14  Yes [provider]  pantoprazole (PROTONIX) 40 MG tablet Take 40 mg by mouth daily.     Yes [provider]  simvastatin (ZOCOR) 20 MG tablet Take 20 mg by mouth daily at 6 PM.  08/17/14  Yes [provider]  metoprolol (LOPRESSOR) 50 MG tablet TAKE 2 TABLETS BY MOUTH EVERY MORNING AND 1 EVERY EVENING Patient taking differently: Take 50-100 mg by mouth See admin instructions. Take 100 mg by mouth in the morning and 50 mg in the evening 12/30/15   Mar Daring, PA-C  Multiple Vitamin (MULTIVITAMIN) tablet Take 1 tablet by mouth daily.      [provider]  naproxen sodium (ANAPROX) 220 MG tablet Take 220 mg by mouth 2 (two) times daily as needed (pain).    [provider]  TANZEUM 50 MG PEN Inject 50 mg into the skin every Monday. 07/23/14   [provider]  TOUJEO SOLOSTAR 300 UNIT/ML SOPN Inject 100 Units into the skin See admin instructions. Use up to 100 units daily 08/17/14   [provider]  furosemide (LASIX) 40 MG tablet Take 40 mg by mouth 2 (two) times daily.    07/23/11  [provider]   Ct Head Wo Contrast  Result Date: 01/14/2018 CLINICAL DATA:  Unexplained altered level consciousness. Slurred speech. Nausea. Frontal headache. Nausea and vomiting. EXAM: CT HEAD WITHOUT CONTRAST TECHNIQUE: Contiguous axial images were obtained from the base of the skull through the vertex without intravenous  contrast. COMPARISON:  07/27/2014 FINDINGS: Brain: No evidence of acute infarction, hemorrhage, hydrocephalus, extra-axial collection, or mass lesion/mass effect. Mild generalized cerebral atrophy, without significant change. Vascular:  No hyperdense vessel or other acute findings. Skull: No evidence of fracture or other significant bone abnormality. Sinuses/Orbits:  No acute findings. Other: None. IMPRESSION: No acute intracranial abnormality.  Mild  cerebral atrophy. Electronically Signed   By: Earle Gell M.D.   On: 01/14/2018 20:05   Ct Abdomen Pelvis W Contrast  Result Date: 01/14/2018 CLINICAL DATA:  Acute abdominal pain. EXAM: CT ABDOMEN AND PELVIS WITH CONTRAST TECHNIQUE: Multidetector CT imaging of the abdomen and pelvis was performed using the standard protocol following bolus administration of intravenous contrast. CONTRAST:  136mL ISOVUE-300 IOPAMIDOL (ISOVUE-300) INJECTION 61% COMPARISON:  None. FINDINGS: Lower chest: Motion artifact through the lung bases. Mild dependent atelectasis on the left. Heart appears prominent size. There are coronary artery calcifications. Hepatobiliary: Decreased hepatic density consistent with steatosis. No discrete focal lesion. Hyperdensity at the gallbladder neck may be stones or sludge. No pericholecystic inflammation. No biliary dilatation. Pancreas: Fatty atrophy.  No ductal dilatation or inflammation. Spleen: Normal in size. Peripheral slightly wedge-shaped low-density in the posterior spleen measures approximately 4 cm in depth. No perisplenic fluid. Adrenals/Urinary Tract: Mild thickening left adrenal gland without dominant nodule. Normal right adrenal gland. No hydronephrosis. Symmetric bilateral perinephric edema. Homogeneous renal enhancement. Possible small exophytic cyst from the mid left kidney versus perinephric edema. Urinary bladder is physiologically distended. No bladder wall thickening. Stomach/Bowel: Small hiatal hernia. Motion artifact and lack of  enteric contrast limits detailed bowel assessment. Allowing for this, no evidence of obstruction, inflammation or bowel wall thickening. Normal appendix. Vascular/Lymphatic: Moderate aorto bi-iliac atherosclerosis without aneurysm. Portal vein and mesenteric vessels are patent. No enlarged abdominal or pelvic lymph nodes. Reproductive: Prostate is unremarkable. Other: No free air or free fluid. Musculoskeletal: Scoliosis and degenerative change throughout the lumbar spine. Slight flattening of the left femoral head may be chronic. No acute osseous abnormality, particularly, no left rib fractures. IMPRESSION: 1. 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 No perisplenic fluid. 2. Symmetric bilateral perinephric edema, likely chronic but can be seen in the setting of urinary tract infection. 3. Gallstones or sludge without gallbladder inflammation on CT. 4. Incidental hepatic steatosis and Aortic Atherosclerosis (ICD10-I70.0). Electronically Signed   By: Keith Rake M.D.   On: 01/14/2018 22:19   Dg Chest Port 1 View  Result Date: 01/15/2018 CLINICAL DATA:  Severe sepsis. EXAM: PORTABLE CHEST 1 VIEW COMPARISON:  Radiograph 12/08/2014 FINDINGS: Stable cardiomegaly. Post CABG with prosthetic valve. No sternal wires visualized. No pulmonary edema. Scattered atelectasis. No confluent airspace disease, pleural effusion or pneumothorax. No acute osseous abnormalities. IMPRESSION: Chronic cardiomegaly with scattered atelectasis. Electronically Signed   By: Keith Rake M.D.   On: 01/15/2018 00:20   Dg Foot Complete Right  Result Date: 01/15/2018 CLINICAL DATA:  RIGHT foot infection, sepsis, prior amputation EXAM: RIGHT FOOT COMPLETE - 3+ VIEW COMPARISON:  04/06/2009 FINDINGS: Osseous demineralization. Prior transmetatarsal amputation. Joint spaces preserved. Mild dorsal spur formation at the talonavicular joint. No acute fracture, dislocation or bone destruction. Motion  artifacts degrade lateral view, no gross osseous abnormalities identified. Scattered soft tissue swelling without soft tissue gas. IMPRESSION: Transmetatarsal amputation RIGHT foot. No acute osseous findings. Electronically Signed   By: Lavonia Dana M.D.   On: 01/15/2018 00:57   - pertinent xrays, CT, MRI studies were reviewed and independently interpreted  Positive ROS: All other systems have been reviewed and were otherwise negative with the exception of those mentioned in the HPI and as above.  Physical Exam: General: Alert, no acute distress Psychiatric: Patient is competent for consent with normal mood and affect Lymphatic: No axillary or cervical lymphadenopathy Cardiovascular: No pedal edema Respiratory: No cyanosis, no use of accessory musculature GI: No organomegaly, abdomen is  soft and non-tender    Images:  @ENCIMAGES @  Labs:  Lab Results  Component Value Date   HGBA1C 9.9 (H) 07/28/2014   HGBA1C  03/27/2009    6.1 (NOTE) The ADA recommends the following therapeutic goal for glycemic control related to Hgb A1c measurement: Goal of therapy: <6.5 Hgb A1c  Reference: American Diabetes Association: Clinical Practice Recommendations 2010, Diabetes Care, 2010, 33: (Suppl  1).   HGBA1C (H) 02/28/2009    7.2 (NOTE) The ADA recommends the following therapeutic goal for glycemic control related to Hgb A1c measurement: Goal of therapy: <6.5 Hgb A1c  Reference: American Diabetes Association: Clinical Practice Recommendations 2010, Diabetes Care, 2010, 33: (Suppl  1).   ESRSEDRATE 45 (H) 12/08/2008   ESRSEDRATE 54 (H) 11/06/2008   CRP 10.0 (H) 01/15/2018   CRP 9.1 (H) 12/08/2008   REPTSTATUS PENDING 01/14/2018   GRAMSTAIN  05/02/2010    RARE WBC PRESENT,BOTH PMN AND MONONUCLEAR FEW GRAM POSITIVE COCCI IN PAIRS RARE GRAM NEGATIVE RODS   GRAMSTAIN  05/02/2010    RARE WBC PRESENT,BOTH PMN AND MONONUCLEAR FEW GRAM POSITIVE COCCI IN PAIRS RARE GRAM NEGATIVE RODS   CULT GRAM  POSITIVE COCCI 01/14/2018   LABORGA ESCHERICHIA COLI 05/02/2010   LABORGA ENTEROCOCCUS SPECIES 05/02/2010    Lab Results  Component Value Date   ALBUMIN 2.6 (L) 01/15/2018   ALBUMIN 3.8 01/14/2018   ALBUMIN 3.2 (L) 12/08/2014   PREALBUMIN 16.6 (L) 05/09/2009   PREALBUMIN 12.2 (L) 05/02/2009   PREALBUMIN 7.4 (L) 04/25/2009    Neurologic: Patient does not have protective sensation bilateral lower extremities.   MUSCULOSKELETAL:   Skin: Examination patient has a black gangrenous ulcer over the lateral aspect of the right foot there is surrounding cellulitis.  Patient has a stable left transtibial amputation.  Patient does not have palpable pulses.  Patient's skin is thin and atrophic involving the right lower extremity.  Assessment: Assessment: Sepsis with uncontrolled type 2 diabetes with severe protein caloric malnutrition with severe peripheral vascular disease with a gangrenous ulcer lateral aspect of the right foot.  Plan: Plan: Discussed with the patient and his family the decision tree of options.  Discussed with his artificial heart valve and sepsis that prolonged attempt for limb salvage with arteriogram vascular reconstruction and attempted foot salvage would be detrimental to his health and his cardiac valve.  Discussed the best option to remove the source of infection continue antibiotics and then evaluate for cardiac valve viability.  Recommended proceeding with a transtibial amputation on the right this Friday discharge to skilled nursing when he is stable risks and benefits were discussed including the incision not healing potential for additional surgery.  Patient and family state they understand and wish to proceed with a right transtibial amputation this Friday.  At this point I do not feel that an MRI scan of the right foot would provide information for foot salvage intervention.  Thank you for the consult and the opportunity to see [name],  [place] [phone] 9:04 PM

## 2018-01-15 NOTE — Progress Notes (Signed)
Inpatient Diabetes Program Recommendations  AACE/ADA: New Consensus Statement on Inpatient Glycemic Control (2015)  Target Ranges:  Prepandial:   less than 140 mg/dL      Peak postprandial:   less than 180 mg/dL (1-2 hours)      Critically ill patients:  140 - 180 mg/dL   Lab Results  Component Value Date   GLUCAP 224 (H) 01/15/2018   HGBA1C 9.9 (H) 07/28/2014    Review of Glycemic Control  Diabetes history: DM2 Outpatient Diabetes medications: Toujeo 60 units QHS, Farxiga 10 mg QD, glipizide 10 mg QD, Tanzeum 50 mg Q Monday Current orders for Inpatient glycemic control: Lantus 30 units QHS, Novolog 0-9 units tidwc and hs  Needs HgbA1C. Last one 07/28/2014 - 9.9%. Blood sugars will be high today as no basal insulin given last night.   Inpatient Diabetes Program Recommendations:     Check HgbA1C to assess glycemic control prior to admission. If post-prandials elevated, add Novolog 4 units tidwc.  Will follow closely.   Thank you. Lorenda Peck, RD, LDN, CDE Inpatient Diabetes Coordinator [phone]

## 2018-01-15 NOTE — ED Notes (Signed)
Dr. Myna Hidalgo notified on pt.'s persistent hypotension despite 4 bags of NS IV boluses .

## 2018-01-15 NOTE — Progress Notes (Signed)
Pharmacy Antibiotic Note  [name] is a 62 y.o. male admitted on 01/14/2018 with MSSA bacteremia. Pharmacy has been consulted for cefazolin dosing. WBC 23.1, CRP 10, LA 5.5>>2.4. Scr 1.75 (BL ~1.2), estimated CrCl ~51 mL/min.  Plan: D/c vanc/cefepime/Flagyl Start cefazolin 2g IV q8h F/u clinical status, repeat Cx, ECHO, renal function  Height: 5\' 10"  (177.8 cm) Weight: 211 lb 13.8 oz (96.1 kg) IBW/kg (Calculated) : 73  Temp (24hrs), Avg:98.2 F (36.8 C), Min:97.8 F (36.6 C), Max:98.6 F (37 C)  Recent Labs  Lab 01/14/18 1926 01/14/18 1933 01/14/18 2027 01/15/18 0245 01/15/18 0246 01/15/18 0602  WBC 19.7*  --   --   --  23.1*  --   CREATININE 1.84*  --   --   --  1.78* 1.75*  LATICACIDVEN  --  6.24* 5.45* 3.0*  --  2.4*    Estimated Creatinine Clearance: 51.5 mL/min (A) (by C-G formula based on SCr of 1.75 mg/dL (H)).    Allergies  Allergen Reactions  . Ace Inhibitors Cough   Antimicrobials this admission: CTX 9/10 x1 Vanc 9/10 >> 9/11 Cefepime 9/10 >> 9/11 Flagyl 9/10 >> 9/11 Cefazolin 9/11 >>  Microbiology results: 9/10 BCx: 4/4 GPC (BCID MSSA) 9/11 BCx: pending  9/11 UCx: pending  Thank you for allowing pharmacy to be a part of this patient's care.  Mila Merry Gerarda Fraction, PharmD PGY2 Infectious Diseases Pharmacy Resident Phone: [phone] 01/15/2018 11:31 AM

## 2018-01-15 NOTE — ED Notes (Addendum)
Rahul PA and Dr. Zacarias Pontes ( ICU) at bedside evaluating pt.

## 2018-01-15 NOTE — ED Notes (Signed)
Dr. Myna Hidalgo ( admitting MD) notified on pt.'s hypotension .

## 2018-01-15 NOTE — Progress Notes (Signed)
PHARMACY - PHYSICIAN COMMUNICATION CRITICAL VALUE ALERT - BLOOD CULTURE IDENTIFICATION (BCID)  [name] is an 62 y.o. male who presented to [facility] on 01/14/2018 with a chief complaint of sepsis.  Assessment: Patient with a complex medical hx who was admitted for sepsis. He has a hx of AVR, s/p BKA, and osteo. Labs called with the BCID and cultures result. He is positive for GPC in 3/4 bottles and BCID is showing staph aureus with MEC A gene neg. Therefore, this is a MSSA.   Name of physician (or Provider) Contacted: CCM  Current antibiotics:  Vanc/cefepime/flagyl  Changes to prescribed antibiotics recommended:  Will get team to de-escalate to [place], PharmD, BCIDP, AAHIVP, CPP Infectious Disease Pharmacist Pager: [phone] 01/15/2018 8:20 AM

## 2018-01-15 NOTE — Significant Event (Addendum)
Critical Care Follow-Up Note.  Patient seen overnight.  Initial critical care consultation reviewed.  Briefly, 62 year old man admitted for suspicion of sepsis secondary to osteomyelitis in the context of diabetic foot disease. Afebrile but marked leukocytosis.  Hypotension overnight with systolic blood pressure to the 80s.  However, lactic acid has improved from 6.2 to 2.4 with fluid administration. On appropriate empiric antibiotic therapy.  No vasopressors.  Subjective:  Denies pain. Feels better this morning.  Objective:  Physical Exam  Constitutional: He is oriented to person, place, and time. He appears well-nourished.  HENT:  Head: Normocephalic.  Eyes: EOM are normal. No scleral icterus.  Neck: No JVD present.  Cardiovascular: Normal rate.  Pulses absent right foot with venous stasis changes.  Pulmonary/Chest: Effort normal and breath sounds normal.  Abdominal: Soft.  Genitourinary: Penis normal.  Genitourinary Comments: Foley catheter in place with good urine output  Musculoskeletal:  Left BKA, right foot stump with boggy eschar on the lateral right aspect.  No warmth or erythema.  Neurological: He is oriented to person, place, and time.   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  Systemic perfusion has improved, on appropriate empiric antibiotics.  Needs definitive source control.  Will consult orthopedics. Improved and stable hemodynamics, patient is ready for transfer to floor.  Mild acute kidney injury.  Creatinine has stabilized.  No acute indications for dialysis and urine output is improving.   Decrease maintenance IV fluids Remove Foley catheter to use UTI risk.  Continue to follow electrolytes.  Significant leukocytosis.  Likely indicative of ongoing nidus of infection. Orthopedic consultation for definitive source management.  Continue to follow leukocytosis.  Thrombocytopenia  Likely sepsis related. No active bleeding. Follow for  now.  Eschar right foot.  Wound care has evaluated the patient.  Type I diabetic with marginal blood sugar control.   Will restart baseline long-acting insulin coverage now that he is eating.  Disposition: Ready for transfer to hospitalist service. Continue current antibiotics. Orthopedics consult called.  CRITICAL CARE Performed by: Kipp Brood   Total critical care time: 40 minutes  Critical care time was exclusive of separately billable procedures and treating other patients.  Critical care was necessary to treat or prevent imminent or life-threatening deterioration.  Critical care was time spent personally by me on the following activities: development of treatment plan with patient and/or surrogate as well as nursing, discussions with consultants, evaluation of patient's response to treatment, examination of patient, obtaining history from patient or surrogate, ordering and performing treatments and interventions, ordering and review of laboratory studies, ordering and review of radiographic studies, pulse oximetry and re-evaluation of patient's condition.'  Kipp Brood, MD [facility] ICU Physician [place]  Pager: [phone] Mobile: [phone] After hours: [phone].

## 2018-01-15 NOTE — Progress Notes (Signed)
Pharmacy Antibiotic Note  [name] is a 62 y.o. male admitted on 01/14/2018 with sepsis.  Pharmacy has been consulted for Vancomycin and Cefepime dosing.   Rocephin and Flagyl given in ED tonight.  Plan: Vancomycin 1500 mg IV x1 then 750 mg IV q12h Cefepime 2 g IV q24h  Monitor clinical status, renal function and culture results daily.  Monitor vancomycin trough at steady state.   Height: 5\' 10"  (177.8 cm) Weight: 220 lb (99.8 kg) IBW/kg (Calculated) : 73  Temp (24hrs), Avg:98.5 F (36.9 C), Min:98.4 F (36.9 C), Max:98.6 F (37 C)  Recent Labs  Lab 01/14/18 1926 01/14/18 1933 01/14/18 2027  WBC 19.7*  --   --   CREATININE 1.84*  --   --   LATICACIDVEN  --  6.24* 5.45*    Estimated Creatinine Clearance: 49.9 mL/min (A) (by C-G formula based on SCr of 1.84 mg/dL (H)).    Allergies  Allergen Reactions  . Ace Inhibitors Cough    Thank you for allowing pharmacy to be a part of this patient's care.  Nicole Cella, RPh Clinical Pharmacist Pager: [phone] Please check AMION for all [place] phone numbers After 10:00 PM, call [place] [phone] 01/15/2018 12:29 AM

## 2018-01-15 NOTE — Consult Note (Signed)
[place] Nurse wound consult note Reason for Consult: right foot wound Xray negative for osteomyelitis. May need MRI.  Patient with history of BKA on the left and transmetatarsal amputation on the right in 2016 per Dr. Sharol Given. Patient reports till only recently site was ok, has not seen Dr. Sharol Given in a few years. Area of concern just developed in the last few days Wound type: full thickness ulceration right lateral forefoot Pressure Injury POA: NA Measurement: 4cm x 5.5cm x 0cm  Wound bed: 100% eschar with extension of tissue necrosis distally  Drainage (amount, consistency, odor) minimal, some mild fluctuation, no odor  Periwound: redness and erythema extends on the dorsal foot, marked with marker today by [place] procedure/placement/frequency:dry dressing for now   Recommend orthopedic consultation, new onset of necrosis of the lateral foot with cellulitis and elevated WBC.    [place] nurse will notify CCM of recommendations.   Discussed POC with patient and bedside nurse.  Re consult if needed, will not follow at this time. Thanks  Dakotah Heiman [employer], RN,CWOCN, CNS, [place] [phone])

## 2018-01-15 NOTE — Consult Note (Signed)
ORTHOPAEDIC CONSULTATION  REQUESTING PHYSICIAN: Kipp Brood, MD  Chief Complaint: Gangrenous ulcer lateral aspect right foot.  HPI: [name] is a 62 y.o. male who presents with sepsis with possible infected cardiac valve with a necrotic gangrenous ulcer of the lateral aspect of the right foot with ascending cellulitis.  Patient is status post a left transtibial amputation and ambulates well with a prosthesis on the left.  Patient is 3 years status post left transtibial amputation.  Past Medical History:  Diagnosis Date  . Bicuspid aortic valve    a. 02/2009 s/p tissue AVR;  b. 07/2014 Echo: EF 55-60%, basal-mid inf HK, mild AS/MR, mod dil LA, PASP 44 mmHg.  Marland Kitchen CAD (coronary artery disease) October 2010   a. 02/2009 NSTEMI/Cath: 3VD; b. 02/2009 CABG x 5: LIMA->LAD, VG->Diag, VG->RI, VG->RVM->RPDA.  . Diabetes mellitus    Type II  . GERD (gastroesophageal reflux disease)   . Heart murmur   . Hyperlipidemia   . Hypertension   . PVD (peripheral vascular disease) ([place])    a. s/p toe amputations on L foot.   Past Surgical History:  Procedure Laterality Date  . AMPUTATION     Left great and second toes 2011  . AMPUTATION     Left great and second toes 2011  . AMPUTATION  07/27/2011   Procedure: AMPUTATION FOOT;  Surgeon: Newt Minion, MD;  Location: [facility];  Service: Orthopedics;  Laterality: Left;  Left Midfoot Amputation   . AMPUTATION Left 12/08/2014   Procedure: Left Below Knee Amputation;  Surgeon: Newt Minion, MD;  Location: [facility];  Service: Orthopedics;  Laterality: Left;  . AORTIC VALVE REPLACEMENT     Pericardial tissue valve  . CORONARY ARTERY BYPASS GRAFT  10   Social History   Socioeconomic History  . Marital status: Single    Spouse name: Not on file  . Number of children: Not on file  . Years of education: Not on file  . Highest education level: Not on file  Occupational History  . Occupation: Publishing rights manager: [employer]  . Financial resource strain: Not on file  . Food insecurity:    Worry: Not on file    Inability: Not on file  . Transportation needs:    Medical: Not on file    Non-medical: Not on file  Tobacco Use  . Smoking status: Former Smoker    Years: 1.00    Last attempt to quit: 10/15/2000    Years since quitting: 17.2  Substance and Sexual Activity  . Alcohol use: No    Comment: Rarely  . Drug use: No  . Sexual activity: Not on file  Lifestyle  . Physical activity:    Days per week: Not on file    Minutes per session: Not on file  . Stress: Not on file  Relationships  . Social connections:    Talks on phone: Not on file    Gets together: Not on file    Attends religious service: Not on file    Active member of club or organization: Not on file    Attends meetings of clubs or organizations: Not on file    Relationship status: Not on file  Other Topics Concern  . Not on file  Social History Narrative   The patient lives alone in [place].   He is on a diabetic diet with regular exercise at work, but little outside of his  work.   Family History  Problem Relation Age of Onset  . Heart attack Father        early age  . Heart disease Father   . Stroke Father   . Heart attack Brother        At age 60  . Stroke Brother   . Diabetes type II Mother   . Diabetes type II Sister   . Diabetes Unknown   . Coronary artery disease Unknown    - negative except otherwise stated in the family history section Allergies  Allergen Reactions  . Ace Inhibitors Cough   Prior to Admission medications   Medication Sig Start Date End Date Taking? Authorizing Provider  aspirin 325 MG tablet Take 325 mg by mouth daily.     Yes [provider]  calcium-vitamin D (OSCAL WITH D) 500-200 MG-UNIT per tablet Take 1 tablet by mouth 2 (two) times daily.    Yes [provider]  cetirizine (ZYRTEC) 10 MG tablet Take 10 mg by mouth daily.   Yes [provider]    Dulaglutide (TRULICITY) 1.5 TM/1.9QQ SOPN Inject 1.5 mg into the skin once a week.   Yes [provider]  FARXIGA 10 MG TABS tablet Take 10 mg by mouth daily. 07/23/14  Yes [provider]  glipiZIDE (GLUCOTROL XL) 10 MG 24 hr tablet Take 10 mg by mouth 2 (two) times daily.  04/02/16  Yes [provider]  latanoprost (XALATAN) 0.005 % ophthalmic solution Place 1 drop into both eyes at bedtime. 07/13/14  Yes [provider]  losartan (COZAAR) 50 MG tablet Take 50 mg by mouth daily.  07/19/14  Yes [provider]  pantoprazole (PROTONIX) 40 MG tablet Take 40 mg by mouth daily.     Yes [provider]  simvastatin (ZOCOR) 20 MG tablet Take 20 mg by mouth daily at 6 PM.  08/17/14  Yes [provider]  metoprolol (LOPRESSOR) 50 MG tablet TAKE 2 TABLETS BY MOUTH EVERY MORNING AND 1 EVERY EVENING Patient taking differently: Take 50-100 mg by mouth See admin instructions. Take 100 mg by mouth in the morning and 50 mg in the evening 12/30/15   Mar Daring, PA-C  Multiple Vitamin (MULTIVITAMIN) tablet Take 1 tablet by mouth daily.      [provider]  naproxen sodium (ANAPROX) 220 MG tablet Take 220 mg by mouth 2 (two) times daily as needed (pain).    [provider]  TANZEUM 50 MG PEN Inject 50 mg into the skin every Monday. 07/23/14   [provider]  TOUJEO SOLOSTAR 300 UNIT/ML SOPN Inject 100 Units into the skin See admin instructions. Use up to 100 units daily 08/17/14   [provider]  furosemide (LASIX) 40 MG tablet Take 40 mg by mouth 2 (two) times daily.    07/23/11  [provider]   Ct Head Wo Contrast  Result Date: 01/14/2018 CLINICAL DATA:  Unexplained altered level consciousness. Slurred speech. Nausea. Frontal headache. Nausea and vomiting. EXAM: CT HEAD WITHOUT CONTRAST TECHNIQUE: Contiguous axial images were obtained from the base of the skull through the vertex without intravenous  contrast. COMPARISON:  07/27/2014 FINDINGS: Brain: No evidence of acute infarction, hemorrhage, hydrocephalus, extra-axial collection, or mass lesion/mass effect. Mild generalized cerebral atrophy, without significant change. Vascular:  No hyperdense vessel or other acute findings. Skull: No evidence of fracture or other significant bone abnormality. Sinuses/Orbits:  No acute findings. Other: None. IMPRESSION: No acute intracranial abnormality.  Mild  cerebral atrophy. Electronically Signed   By: Earle Gell M.D.   On: 01/14/2018 20:05   Ct Abdomen Pelvis W Contrast  Result Date: 01/14/2018 CLINICAL DATA:  Acute abdominal pain. EXAM: CT ABDOMEN AND PELVIS WITH CONTRAST TECHNIQUE: Multidetector CT imaging of the abdomen and pelvis was performed using the standard protocol following bolus administration of intravenous contrast. CONTRAST:  138mL ISOVUE-300 IOPAMIDOL (ISOVUE-300) INJECTION 61% COMPARISON:  None. FINDINGS: Lower chest: Motion artifact through the lung bases. Mild dependent atelectasis on the left. Heart appears prominent size. There are coronary artery calcifications. Hepatobiliary: Decreased hepatic density consistent with steatosis. No discrete focal lesion. Hyperdensity at the gallbladder neck may be stones or sludge. No pericholecystic inflammation. No biliary dilatation. Pancreas: Fatty atrophy.  No ductal dilatation or inflammation. Spleen: Normal in size. Peripheral slightly wedge-shaped low-density in the posterior spleen measures approximately 4 cm in depth. No perisplenic fluid. Adrenals/Urinary Tract: Mild thickening left adrenal gland without dominant nodule. Normal right adrenal gland. No hydronephrosis. Symmetric bilateral perinephric edema. Homogeneous renal enhancement. Possible small exophytic cyst from the mid left kidney versus perinephric edema. Urinary bladder is physiologically distended. No bladder wall thickening. Stomach/Bowel: Small hiatal hernia. Motion artifact and lack of  enteric contrast limits detailed bowel assessment. Allowing for this, no evidence of obstruction, inflammation or bowel wall thickening. Normal appendix. Vascular/Lymphatic: Moderate aorto bi-iliac atherosclerosis without aneurysm. Portal vein and mesenteric vessels are patent. No enlarged abdominal or pelvic lymph nodes. Reproductive: Prostate is unremarkable. Other: No free air or free fluid. Musculoskeletal: Scoliosis and degenerative change throughout the lumbar spine. Slight flattening of the left femoral head may be chronic. No acute osseous abnormality, particularly, no left rib fractures. IMPRESSION: 1. 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 No perisplenic fluid. 2. Symmetric bilateral perinephric edema, likely chronic but can be seen in the setting of urinary tract infection. 3. Gallstones or sludge without gallbladder inflammation on CT. 4. Incidental hepatic steatosis and Aortic Atherosclerosis (ICD10-I70.0). Electronically Signed   By: Keith Rake M.D.   On: 01/14/2018 22:19   Dg Chest Port 1 View  Result Date: 01/15/2018 CLINICAL DATA:  Severe sepsis. EXAM: PORTABLE CHEST 1 VIEW COMPARISON:  Radiograph 12/08/2014 FINDINGS: Stable cardiomegaly. Post CABG with prosthetic valve. No sternal wires visualized. No pulmonary edema. Scattered atelectasis. No confluent airspace disease, pleural effusion or pneumothorax. No acute osseous abnormalities. IMPRESSION: Chronic cardiomegaly with scattered atelectasis. Electronically Signed   By: Keith Rake M.D.   On: 01/15/2018 00:20   Dg Foot Complete Right  Result Date: 01/15/2018 CLINICAL DATA:  RIGHT foot infection, sepsis, prior amputation EXAM: RIGHT FOOT COMPLETE - 3+ VIEW COMPARISON:  04/06/2009 FINDINGS: Osseous demineralization. Prior transmetatarsal amputation. Joint spaces preserved. Mild dorsal spur formation at the talonavicular joint. No acute fracture, dislocation or bone destruction. Motion  artifacts degrade lateral view, no gross osseous abnormalities identified. Scattered soft tissue swelling without soft tissue gas. IMPRESSION: Transmetatarsal amputation RIGHT foot. No acute osseous findings. Electronically Signed   By: Lavonia Dana M.D.   On: 01/15/2018 00:57   - pertinent xrays, CT, MRI studies were reviewed and independently interpreted  Positive ROS: All other systems have been reviewed and were otherwise negative with the exception of those mentioned in the HPI and as above.  Physical Exam: General: Alert, no acute distress Psychiatric: Patient is competent for consent with normal mood and affect Lymphatic: No axillary or cervical lymphadenopathy Cardiovascular: No pedal edema Respiratory: No cyanosis, no use of accessory musculature GI: No organomegaly, abdomen is  soft and non-tender    Images:  @ENCIMAGES @  Labs:  Lab Results  Component Value Date   HGBA1C 9.9 (H) 07/28/2014   HGBA1C  03/27/2009    6.1 (NOTE) The ADA recommends the following therapeutic goal for glycemic control related to Hgb A1c measurement: Goal of therapy: <6.5 Hgb A1c  Reference: American Diabetes Association: Clinical Practice Recommendations 2010, Diabetes Care, 2010, 33: (Suppl  1).   HGBA1C (H) 02/28/2009    7.2 (NOTE) The ADA recommends the following therapeutic goal for glycemic control related to Hgb A1c measurement: Goal of therapy: <6.5 Hgb A1c  Reference: American Diabetes Association: Clinical Practice Recommendations 2010, Diabetes Care, 2010, 33: (Suppl  1).   ESRSEDRATE 45 (H) 12/08/2008   ESRSEDRATE 54 (H) 11/06/2008   CRP 10.0 (H) 01/15/2018   CRP 9.1 (H) 12/08/2008   REPTSTATUS PENDING 01/14/2018   GRAMSTAIN  05/02/2010    RARE WBC PRESENT,BOTH PMN AND MONONUCLEAR FEW GRAM POSITIVE COCCI IN PAIRS RARE GRAM NEGATIVE RODS   GRAMSTAIN  05/02/2010    RARE WBC PRESENT,BOTH PMN AND MONONUCLEAR FEW GRAM POSITIVE COCCI IN PAIRS RARE GRAM NEGATIVE RODS   CULT GRAM  POSITIVE COCCI 01/14/2018   LABORGA ESCHERICHIA COLI 05/02/2010   LABORGA ENTEROCOCCUS SPECIES 05/02/2010    Lab Results  Component Value Date   ALBUMIN 2.6 (L) 01/15/2018   ALBUMIN 3.8 01/14/2018   ALBUMIN 3.2 (L) 12/08/2014   PREALBUMIN 16.6 (L) 05/09/2009   PREALBUMIN 12.2 (L) 05/02/2009   PREALBUMIN 7.4 (L) 04/25/2009    Neurologic: Patient does not have protective sensation bilateral lower extremities.   MUSCULOSKELETAL:   Skin: Examination patient has a black gangrenous ulcer over the lateral aspect of the right foot there is surrounding cellulitis.  Patient has a stable left transtibial amputation.  Patient does not have palpable pulses.  Patient's skin is thin and atrophic involving the right lower extremity.  Assessment: Assessment: Sepsis with uncontrolled type 2 diabetes with severe protein caloric malnutrition with severe peripheral vascular disease with a gangrenous ulcer lateral aspect of the right foot.  Plan: Plan: Discussed with the patient and his family the decision tree of options.  Discussed with his artificial heart valve and sepsis that prolonged attempt for limb salvage with arteriogram vascular reconstruction and attempted foot salvage would be detrimental to his health and his cardiac valve.  Discussed the best option to remove the source of infection continue antibiotics and then evaluate for cardiac valve viability.  Recommended proceeding with a transtibial amputation on the right this Friday discharge to skilled nursing when he is stable risks and benefits were discussed including the incision not healing potential for additional surgery.  Patient and family state they understand and wish to proceed with a right transtibial amputation this Friday.  At this point I do not feel that an MRI scan of the right foot would provide information for foot salvage intervention.  Thank you for the consult and the opportunity to see Mr. Justino Boze,  [place] [phone] 9:04 PM

## 2018-01-15 NOTE — Progress Notes (Signed)
  Echocardiogram 2D Echocardiogram has been performed.  [name] 01/15/2018, 3:37 PM

## 2018-01-15 NOTE — H&P (Signed)
History and Physical    [name] OQH:[id number] DOB: 11/15/1955 DOA: 01/14/2018  PCP: [name], MD   Patient coming from: Home   Chief Complaint: Malaise, vomiting, lower abdominal pain, right foot wound   HPI: [name] is a 62 y.o. male with medical history significant for coronary artery disease, aortic valve replacement, insulin-dependent diabetes mellitus, and peripheral vascular disease status post left BKA and right TMA, now presenting to the emergency department for evaluation of general malaise, loss of appetite, lower abdominal discomfort, and nonbloody vomiting.  Patient reports that he been in his usual state until the day prior to presentation when the aforementioned symptoms developed.  He reports chills.  Denies diarrhea, melena, or hematochezia.  Reports cramping discomfort in the lower abdomen and nausea with nonbloody vomiting.  He denies shortness of breath or chest pain.  He has had a mild headache associated with this, but denies focal numbness or weakness.  Family noted bleeding from a right foot wound earlier today.  Patient states that he does not have sensation in the right foot.  ED Course: Upon arrival to the ED, patient is found to be afebrile, saturating adequately on room air, mildly tachypneic, tachycardic in the 120s, and with blood pressure 90/60.  EKG features sinus tachycardia with rate 126 and LVH with repolarization abnormality.  Chest x-ray is notable for stable cardiomegaly and atelectasis.  Noncontrast head CT is negative for acute intracranial abnormality.  CT the abdomen and pelvis is notable for peripheral wedge-shaped defect in the spleen consistent with infarct, as well as symmetric bilateral perinephric edema, possibly chronic or infectious.  Chemistry panel is notable for creatinine 1.84, up from 1.13 in 2016.  CBC is notable for leukocytosis to 19,700 with increased bands.  Lactic acid is elevated to 6.24.  Blood cultures were  collected in the ED, 30 cc/kg NS bolus was given, and the patient was treated with Rocephin and Flagyl.  Urinalysis was ordered but not yet collected.  SBP remains in the 90s, tachycardia has improved, and the patient will be admitted to the stepdown unit for ongoing evaluation and management of severe sepsis most likely secondary to urinary source or right foot infection.  Review of Systems:  All other systems reviewed and apart from HPI, are negative.  Past Medical History:  Diagnosis Date  . Bicuspid aortic valve    a. 02/2009 s/p tissue AVR;  b. 07/2014 Echo: EF 55-60%, basal-mid inf HK, mild AS/MR, mod dil LA, PASP 44 mmHg.  Marland Kitchen CAD (coronary artery disease) October 2010   a. 02/2009 NSTEMI/Cath: 3VD; b. 02/2009 CABG x 5: LIMA->LAD, VG->Diag, VG->RI, VG->RVM->RPDA.  . Diabetes mellitus    Type II  . GERD (gastroesophageal reflux disease)   . Heart murmur   . Hyperlipidemia   . Hypertension   . PVD (peripheral vascular disease) ([place])    a. s/p toe amputations on L foot.    Past Surgical History:  Procedure Laterality Date  . AMPUTATION     Left great and second toes 2011  . AMPUTATION     Left great and second toes 2011  . AMPUTATION  07/27/2011   Procedure: AMPUTATION FOOT;  Surgeon: Newt Minion, MD;  Location: [place];  Service: Orthopedics;  Laterality: Left;  Left Midfoot Amputation   . AMPUTATION Left 12/08/2014   Procedure: Left Below Knee Amputation;  Surgeon: Newt Minion, MD;  Location: [place];  Service: Orthopedics;  Laterality: Left;  . AORTIC VALVE REPLACEMENT  Pericardial tissue valve  . CORONARY ARTERY BYPASS GRAFT  10     reports that he quit smoking about 17 years ago. He quit after 1.00 year of use. He does not have any smokeless tobacco history on file. He reports that he does not drink alcohol or use drugs.  Allergies  Allergen Reactions  . Ace Inhibitors Cough    Family History  Problem Relation Age of Onset  . Heart attack Father        early age    . Heart disease Father   . Stroke Father   . Heart attack Brother        At age 75  . Stroke Brother   . Diabetes type II Mother   . Diabetes type II Sister   . Diabetes Unknown   . Coronary artery disease Unknown      Prior to Admission medications   Medication Sig Start Date End Date Taking? Authorizing Provider  aspirin 325 MG tablet Take 325 mg by mouth daily.      [provider]  calcium-vitamin D (OSCAL WITH D) 500-200 MG-UNIT per tablet Take 1 tablet by mouth 2 (two) times daily.     [provider]  cetirizine (ZYRTEC) 10 MG tablet Take 10 mg by mouth daily.    [provider]  FARXIGA 10 MG TABS tablet Take 10 mg by mouth daily. 07/23/14   [provider]  glipiZIDE (GLUCOTROL XL) 10 MG 24 hr tablet  04/02/16   [provider]  latanoprost (XALATAN) 0.005 % ophthalmic solution Place 1 drop into both eyes at bedtime. 07/13/14   [provider]  losartan (COZAAR) 50 MG tablet Take 50 mg by mouth every morning. 07/19/14   [provider]  metoprolol (LOPRESSOR) 50 MG tablet TAKE 2 TABLETS BY MOUTH EVERY MORNING AND 1 EVERY EVENING 12/30/15   Mar Daring, PA-C  Multiple Vitamin (MULTIVITAMIN) tablet Take 1 tablet by mouth daily.      [provider]  naproxen sodium (ANAPROX) 220 MG tablet Take 220 mg by mouth 2 (two) times daily as needed (pain).    [provider]  ondansetron (ZOFRAN) 4 MG tablet Take 4 mg by mouth every 8 (eight) hours as needed for nausea or vomiting.    [provider]  pantoprazole (PROTONIX) 40 MG tablet Take 40 mg by mouth daily.      [provider]  silver sulfADIAZINE (SILVADENE) 1 % cream Apply 1 application topically as needed. For wound care 07/13/14   [provider]  simvastatin (ZOCOR) 20 MG tablet Take 20 mg by mouth daily at 6 PM.  08/17/14   [provider]  TANZEUM 50 MG PEN Inject 50 mg into the skin every Monday. 07/23/14    [provider]  TOUJEO SOLOSTAR 300 UNIT/ML SOPN Inject 60 Units into the skin at bedtime. 08/17/14   [provider]  furosemide (LASIX) 40 MG tablet Take 40 mg by mouth 2 (two) times daily.    07/23/11  [provider]    Physical Exam: Vitals:   01/14/18 2245 01/14/18 2300 01/14/18 2315 01/14/18 2345  BP: 110/72 101/64 96/62 (!) 91/58  Pulse: (!) 121 (!) 117 (!) 117 (!) 116  Resp: (!) 26 (!) 28 18 20   Temp:      TempSrc:      SpO2: 97% 95% 99% 100%  Weight:      Height:  Constitutional: NAD, appears uncomfortable Eyes: PERTLA, lids and conjunctivae normal ENMT: Mucous membranes are moist. Posterior pharynx clear of any exudate or lesions.   Neck: normal, supple, no masses, no thyromegaly Respiratory: clear to auscultation bilaterally, no wheezing, no crackles. Normal respiratory effort.    Cardiovascular: Rate ~110 and regular. No extremity edema.   Abdomen: Soft, no distension, generalized tenderness without rebound pain or guarding. Bowel sounds active.  Musculoskeletal: no clubbing / cyanosis. Status-post right TMA and left BKA.  Skin: Eschar at lateral aspect of right midfoot with surrounding edema and erythema. Warm, dry, well-perfused. Neurologic: No facial asymmetry. Sensation to light touch diminished in right foot. Strength 5/5 in all 4 limbs.  Psychiatric: Alert and oriented to person, place, and situation. Calm, cooperative.     Labs on Admission: I have personally reviewed following labs and imaging studies  CBC: Recent Labs  Lab 01/14/18 1926  WBC 19.7*  NEUTROABS 17.9*  HGB 15.7  HCT 47.7  MCV 88.0  PLT 119*   Basic Metabolic Panel: Recent Labs  Lab 01/14/18 1926  NA 135  K 3.8  CL 100  CO2 19*  GLUCOSE 182*  BUN 19  CREATININE 1.84*  CALCIUM 9.0   GFR: Estimated Creatinine Clearance: 49.9 mL/min (A) (by C-G formula based on SCr of 1.84 mg/dL (H)). Liver Function Tests: Recent Labs  Lab 01/14/18 1926    AST 36  ALT 20  ALKPHOS 78  BILITOT 1.4*  PROT 7.1  ALBUMIN 3.8   Recent Labs  Lab 01/14/18 1926  LIPASE 23   Recent Labs  Lab 01/14/18 1927  AMMONIA 36*   Coagulation Profile: Recent Labs  Lab 01/14/18 1926  INR 1.43   Cardiac Enzymes: No results for input(s): CKTOTAL, CKMB, CKMBINDEX, TROPONINI in the last 168 hours. BNP (last 3 results) No results for input(s): PROBNP in the last 8760 hours. HbA1C: No results for input(s): HGBA1C in the last 72 hours. CBG: Recent Labs  Lab 01/14/18 1831  GLUCAP 327*   Lipid Profile: No results for input(s): CHOL, HDL, LDLCALC, TRIG, CHOLHDL, LDLDIRECT in the last 72 hours. Thyroid Function Tests: No results for input(s): TSH, T4TOTAL, FREET4, T3FREE, THYROIDAB in the last 72 hours. Anemia Panel: No results for input(s): VITAMINB12, FOLATE, FERRITIN, TIBC, IRON, RETICCTPCT in the last 72 hours. Urine analysis:    Component Value Date/Time   COLORURINE YELLOW 07/28/2014 0433   APPEARANCEUR CLEAR 07/28/2014 0433   LABSPEC 1.033 (H) 07/28/2014 0433   PHURINE 6.0 07/28/2014 0433   GLUCOSEU >1000 (A) 07/28/2014 0433   HGBUR SMALL (A) 07/28/2014 0433   BILIRUBINUR NEGATIVE 07/28/2014 0433   KETONESUR 15 (A) 07/28/2014 0433   PROTEINUR 100 (A) 07/28/2014 0433   UROBILINOGEN 0.2 07/28/2014 0433   NITRITE NEGATIVE 07/28/2014 0433   LEUKOCYTESUR NEGATIVE 07/28/2014 0433   Sepsis Labs: @LABRCNTIP (procalcitonin:4,lacticidven:4) )No results found for this or any previous visit (from the past 240 hour(s)).   Radiological Exams on Admission: Ct Head Wo Contrast  Result Date: 01/14/2018 CLINICAL DATA:  Unexplained altered level consciousness. Slurred speech. Nausea. Frontal headache. Nausea and vomiting. EXAM: CT HEAD WITHOUT CONTRAST TECHNIQUE: Contiguous axial images were obtained from the base of the skull through the vertex without intravenous contrast. COMPARISON:  07/27/2014 FINDINGS: Brain: No evidence of acute infarction,  hemorrhage, hydrocephalus, extra-axial collection, or mass lesion/mass effect. Mild generalized cerebral atrophy, without significant change. Vascular:  No hyperdense vessel or other acute findings. Skull: No evidence of fracture or other significant bone abnormality. Sinuses/Orbits:  No acute  findings. Other: None. IMPRESSION: No acute intracranial abnormality.  Mild cerebral atrophy. Electronically Signed   By: Earle Gell M.D.   On: 01/14/2018 20:05   Ct Abdomen Pelvis W Contrast  Result Date: 01/14/2018 CLINICAL DATA:  Acute abdominal pain. EXAM: CT ABDOMEN AND PELVIS WITH CONTRAST TECHNIQUE: Multidetector CT imaging of the abdomen and pelvis was performed using the standard protocol following bolus administration of intravenous contrast. CONTRAST:  174mL ISOVUE-300 IOPAMIDOL (ISOVUE-300) INJECTION 61% COMPARISON:  None. FINDINGS: Lower chest: Motion artifact through the lung bases. Mild dependent atelectasis on the left. Heart appears prominent size. There are coronary artery calcifications. Hepatobiliary: Decreased hepatic density consistent with steatosis. No discrete focal lesion. Hyperdensity at the gallbladder neck may be stones or sludge. No pericholecystic inflammation. No biliary dilatation. Pancreas: Fatty atrophy.  No ductal dilatation or inflammation. Spleen: Normal in size. Peripheral slightly wedge-shaped low-density in the posterior spleen measures approximately 4 cm in depth. No perisplenic fluid. Adrenals/Urinary Tract: Mild thickening left adrenal gland without dominant nodule. Normal right adrenal gland. No hydronephrosis. Symmetric bilateral perinephric edema. Homogeneous renal enhancement. Possible small exophytic cyst from the mid left kidney versus perinephric edema. Urinary bladder is physiologically distended. No bladder wall thickening. Stomach/Bowel: Small hiatal hernia. Motion artifact and lack of enteric contrast limits detailed bowel assessment. Allowing for this, no evidence of  obstruction, inflammation or bowel wall thickening. Normal appendix. Vascular/Lymphatic: Moderate aorto bi-iliac atherosclerosis without aneurysm. Portal vein and mesenteric vessels are patent. No enlarged abdominal or pelvic lymph nodes. Reproductive: Prostate is unremarkable. Other: No free air or free fluid. Musculoskeletal: Scoliosis and degenerative change throughout the lumbar spine. Slight flattening of the left femoral head may be chronic. No acute osseous abnormality, particularly, no left rib fractures. IMPRESSION: 1. 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 No perisplenic fluid. 2. Symmetric bilateral perinephric edema, likely chronic but can be seen in the setting of urinary tract infection. 3. Gallstones or sludge without gallbladder inflammation on CT. 4. Incidental hepatic steatosis and Aortic Atherosclerosis (ICD10-I70.0). Electronically Signed   By: Keith Rake M.D.   On: 01/14/2018 22:19    EKG: Independently reviewed. Sinus tachycardia (rate 126), LVH with repolarization abnormality.   Assessment/Plan  1. SIRS/Sepsis  - Presents with 2 days of decreased appetite, malaise, lower abdominal discomfort, and nausea with vomiting  - Found to have leukocytosis, lactic acidosis, tachycardia, and renal insufficiency  - CXR without consolidation, UA not yet collected, CT abd/pelvis with splenic infarct and perinephric edema bilaterally (chronic vs acute UTI), right foot concerning for infection  - Blood cultures collected in ED, 30 cc/kg NS bolus given, and empiric antibiotics started with Rocephin and Flagyl  - Follow-up UA, continue IVF hydration, continue empiric antibiotics with vancomycin, cefepime, and Flagyl, follow cultures and clinical course    2. Right foot wound  - Presents with malaise, abd pain, N/V, and found to have leukocytosis and lactic acidosis  - Right foot is s/p TMA with eschar at lateral aspect and surrounding edema and  erythema concerning for infection  - Radiographs negative, blood cultures incubating, check inflammatory markers, continue empiric antibiotics, consider advanced imaging and/or orthopedic consultation    3. Renal insufficiency  - SCr is 1.84 on admission, up from 1.13 three years ago with no more recent values available  - No hydronephrosis noted on CT  - There is likely an acute component in setting of sepsis with loss of appetite and vomiting  - Fluid-resuscitated in ED with 3 liters NS and  continued on IVF hydration  - Check urine chemistries, renally-dose medications, avoid nephrotoxins, repeat chem panel in am    4. CAD  - No anginal complaints  - Continue ASA and statin, hold ARB in light of hypotension and renal insufficiency    5. Insulin-dependent DM  - No recent A1c available, was 9.9% in 2016 - Managed at home with Toujeo 60 units qHS, glipizide, and Farxiga, held on admission  - Check CBG's, use Lantus and SSI with Novolog while in hospital    6. Thrombocytopenia  - Platelets 124k on admission  - Appears chronic and stable, no bleeding noted   7. Splenic infarct  - Noted on CT  - Possibly cardioembolic, will check echocardiogram, continue supportive care with pain-control    DVT prophylaxis: sq heparin  Code Status: Full  Family Communication: Family updated at bedside Consults called: None Admission status: Inpatient     Vianne Bulls, MD [facility] Pager [phone]  If 7PM-7AM, please contact night-coverage [email or web address] Password [email or web address]  01/15/2018, 12:05 AM

## 2018-01-15 NOTE — Consult Note (Signed)
[name]  QIH:[id number] DOB: [birth year] DOA: 01/14/2018 PCP: [name], MD    LOS: 0 days   Reason for Consult / Chief Complaint:  Weakness, hypotension  Consulting MD and date:  Opyd 01/15/18  HPI/Summary of hospital stay:   [name] is a 62 y.o. male who  has a past medical history of Bicuspid aortic valve, CAD (coronary artery disease) (October 2010), Diabetes mellitus, GERD (gastroesophageal reflux disease), Heart murmur, Hyperlipidemia, Hypertension, and PVD (peripheral vascular disease) ([place]).  He presented to [facility] ED on 01/14/18 with generalized weakness, fatigue, loss of appetite, vomiting.  He had been in his usual state of health until the day prior to ED presentation.  He skipped his inulin the day prior per niece due to feeling bad and having vomiting.  Was also unable to keep any PO intake down due to vomiting as well as lower abdominal cramping.  Denies any fevers/chills/sweats, chest pain, dyspnea, myalgias, exposures to known sick contacts.  He was initially admitted by [facility] for sepsis felt to be due to right foot wound; however, due to persistent hypotension and elevated lactic acid, PCCM was consulted early AM hours 9/11 for consideration of ICU. Of note, he had CT of abd / pelvis that had incidental finding of splenic infarct.  Subjective:  Feels very tired. Has some generalized abdominal discomfort but no true pain.  Denies dyspnea.  Objective   Blood pressure 92/62, pulse (!) 102, temperature 98.4 F (36.9 C), resp. rate (!) 27, height 5\' 10"  (1.778 m), weight 99.8 kg, SpO2 96 %.        Intake/Output Summary (Last 24 hours) at 01/15/2018 0328 Last data filed at 01/15/2018 0307 Gross per 24 hour  Intake 4300 ml  Output 300 ml  Net 4000 ml   Filed Weights   01/14/18 2012  Weight: 99.8 kg    Examination: General: Adult male, resting in bed, in NAD. Neuro: Sleepy but arouses to voice and follows commands. HEENT: /AT. Sclerae  anicteric, EOMI.  MM dry. Cardiovascular: RRR, no M/R/G.  Lungs: Respirations even and unlabored.  CTA bilaterally, No W/R/R. Abdomen: BS x 4, soft, NT/ND.  Musculoskeletal: Right transmetatarsal amputation, left BKA.  Right foot stump with necrotic area / dry gangrene to lateral base.  Right tibial region with chronic stasis and non-healing superficial abrasions. Skin: Intact, warm, no rashes.  Consults: date of consult/date signed off & final recs:  Surgery 9/11   Procedures: None.  Significant Diagnostic Tests: CT head 9/10 > no acute process. CT A / P 9/10 > peripheral wedge shaped defect in spleen c/w infarct.  Symmetric bilateral perinephric edema, likely chronic.  Gallstones without inflammation.  Hepatic steatosis. R foot XR 9/10 > no osseous changes. Echo 9/11 >   Micro Data: Blood 9/11 >  R foot wound 9/11 >   Antimicrobials:  Vanc 9/11 >  Cefepime 9/11 >   Flagyl 9/11 >   Resolved Hospital Problem list    Assessment & Plan:   Sepsis - presumed due to right foot wound at this point, no additional clear etiology. Plan: Continue IVF's, additional 1L NS bolus now. Might need low dose neo if BP does not respond (is gradually responding to fluids). Continue empiric abx and follow cultures. Assess cortisol and start empiric stress steroids, d/c if cortisol > 20.  AKI - presumed pre-renal from hypovolemia; gradually improving with fluids. AGMA - lactate. Plan: Continue supportive care. Follow BMP. Repeat lactate.  DM - some concern for  early DKA given ketonuria and small AGMA. Plan: Continue SSI for now. Check beta hydroxybutyric acid. Might need insulin infusion. Hold preadmission farxiga, glipizide, toujeo.  Splenic infarct - unclear etiology at this point. No hx A.fib. Plan: F/u on echo. Continue supportive care. Defer anticoagulation for now.  Right foot wound - presumed source of infection. Plan: Follow cultures. Might need further imaging / MRI  and possibly ortho consult. Wound care consult in AM.  Hx CAD, HLD, PVD. Plan: Continue preadmission ASA, simvastatin. Hold preadmission losartan, lopressor.   Disposition / Summary of Today's Plan 01/15/18   ICU for now given borderline BP and elevated lactate.  Best Practice / Goals of Care / Disposition.   DVT prophylaxis: SCD's / heparin. GI prophylaxis: N/A. Diet: Regular. Mobility: Bedrest for now. Code Status: Full. Family Communication: Niece updated at bedside.   Labs   CBC: Recent Labs  Lab 01/14/18 1926 01/15/18 0246  WBC 19.7* 23.1*  NEUTROABS 17.9* PENDING  HGB 15.7 12.1*  HCT 47.7 37.8*  MCV 88.0 89.6  PLT 124* PENDING   Basic Metabolic Panel: Recent Labs  Lab 01/14/18 1926 01/15/18 0246  NA 135 136  K 3.8 4.4  CL 100 111  CO2 19* 15*  GLUCOSE 182* 195*  BUN 19 24*  CREATININE 1.84* 1.78*  CALCIUM 9.0 7.1*   GFR: Estimated Creatinine Clearance: 51.6 mL/min (A) (by C-G formula based on SCr of 1.78 mg/dL (H)). Recent Labs  Lab 01/14/18 1926 01/14/18 1933 01/14/18 2027 01/15/18 0246  WBC 19.7*  --   --  23.1*  LATICACIDVEN  --  6.24* 5.45*  --    Liver Function Tests: Recent Labs  Lab 01/14/18 1926 01/15/18 0246  AST 36 32  ALT 20 17  ALKPHOS 78 47  BILITOT 1.4* 0.8  PROT 7.1 5.0*  ALBUMIN 3.8 2.6*   Recent Labs  Lab 01/14/18 1926  LIPASE 23   Recent Labs  Lab 01/14/18 1927  AMMONIA 36*   ABG    Component Value Date/Time   PHART 7.432 04/21/2009 1757   PCO2ART 29.6 (L) 04/21/2009 1757   PO2ART 95.0 04/21/2009 1757   HCO3 19.8 (L) 04/21/2009 1757   TCO2 23 07/27/2014 2142   ACIDBASEDEF 4.0 (H) 04/21/2009 1757   O2SAT 98.0 04/21/2009 1757    Coagulation Profile: Recent Labs  Lab 01/14/18 1926 01/15/18 0246  INR 1.43 1.72   Cardiac Enzymes: No results for input(s): CKTOTAL, CKMB, CKMBINDEX, TROPONINI in the last 168 hours. HbA1C: Hgb A1c MFr Bld  Date/Time Value Ref Range Status  07/28/2014 05:22 AM 9.9  (H) 4.8 - 5.6 % Final    Comment:    (NOTE)         Pre-diabetes: 5.7 - 6.4         Diabetes: >6.4         Glycemic control for adults with diabetes: <7.0   03/27/2009 11:30 PM  4.6 - 6.1 % Final   6.1 (NOTE) The ADA recommends the following therapeutic goal for glycemic control related to Hgb A1c measurement: Goal of therapy: <6.5 Hgb A1c  Reference: American Diabetes Association: Clinical Practice Recommendations 2010, Diabetes Care, 2010, 33: (Suppl  1).   CBG: Recent Labs  Lab 01/14/18 1831 01/15/18 0112  GLUCAP 327* 172*     Review of Systems:   All negative; except for those that are bolded, which indicate positives.  Constitutional: weight loss, weight gain, night sweats, fevers, chills, fatigue, weakness.  HEENT: headaches, sore throat, sneezing, nasal congestion,  post nasal drip, difficulty swallowing, tooth/dental problems, visual complaints, visual changes, ear aches. Neuro: difficulty with speech, weakness, numbness, ataxia. CV:  chest pain, orthopnea, PND, swelling in lower extremities, dizziness, palpitations, syncope.  Resp: cough, hemoptysis, dyspnea, wheezing. GI: heartburn, indigestion, abdominal pain, nausea, vomiting, diarrhea, constipation, change in bowel habits, loss of appetite, hematemesis, melena, hematochezia.  GU: dysuria, change in color of urine, urgency or frequency, flank pain, hematuria. MSK: joint pain or swelling, decreased range of motion. Psych: change in mood or affect, depression, anxiety, suicidal ideations, homicidal ideations. Skin: rash, itching, bruising.   Past medical history  He,  has a past medical history of Bicuspid aortic valve, CAD (coronary artery disease) (October 2010), Diabetes mellitus, GERD (gastroesophageal reflux disease), Heart murmur, Hyperlipidemia, Hypertension, and PVD (peripheral vascular disease) ([place]).   Surgical History    Past Surgical History:  Procedure Laterality Date  . AMPUTATION     Left great and  second toes 2011  . AMPUTATION     Left great and second toes 2011  . AMPUTATION  07/27/2011   Procedure: AMPUTATION FOOT;  Surgeon: Newt Minion, MD;  Location: [facility];  Service: Orthopedics;  Laterality: Left;  Left Midfoot Amputation   . AMPUTATION Left 12/08/2014   Procedure: Left Below Knee Amputation;  Surgeon: Newt Minion, MD;  Location: [facility];  Service: Orthopedics;  Laterality: Left;  . AORTIC VALVE REPLACEMENT     Pericardial tissue valve  . CORONARY ARTERY BYPASS GRAFT  10     Social History   Social History   Socioeconomic History  . Marital status: Single    Spouse name: Not on file  . Number of children: Not on file  . Years of education: Not on file  . Highest education level: Not on file  Occupational History  . Occupation: Publishing rights manager: [employer]  . Financial resource strain: Not on file  . Food insecurity:    Worry: Not on file    Inability: Not on file  . Transportation needs:    Medical: Not on file    Non-medical: Not on file  Tobacco Use  . Smoking status: Former Smoker    Years: 1.00    Last attempt to quit: 10/15/2000    Years since quitting: 17.2  Substance and Sexual Activity  . Alcohol use: No    Comment: Rarely  . Drug use: No  . Sexual activity: Not on file  Lifestyle  . Physical activity:    Days per week: Not on file    Minutes per session: Not on file  . Stress: Not on file  Relationships  . Social connections:    Talks on phone: Not on file    Gets together: Not on file    Attends religious service: Not on file    Active member of club or organization: Not on file    Attends meetings of clubs or organizations: Not on file    Relationship status: Not on file  . Intimate partner violence:    Fear of current or ex partner: Not on file    Emotionally abused: Not on file    Physically abused: Not on file    Forced sexual activity: Not on file  Other Topics Concern  . Not on file    Social History Narrative   The patient lives alone in [place].   He is on a diabetic diet with regular exercise at work, but little  outside of his work.  ,  reports that he quit smoking about 17 years ago. He quit after 1.00 year of use. He does not have any smokeless tobacco history on file. He reports that he does not drink alcohol or use drugs.   Family history   His family history includes Coronary artery disease in his unknown relative; Diabetes in his unknown relative; Diabetes type II in his mother and sister; Heart attack in his brother and father; Heart disease in his father; Stroke in his brother and father.   Allergies Allergies  Allergen Reactions  . Ace Inhibitors Cough    Home meds  Prior to Admission medications   Medication Sig Start Date End Date Taking? Authorizing Provider  aspirin 325 MG tablet Take 325 mg by mouth daily.     Yes [provider]  calcium-vitamin D (OSCAL WITH D) 500-200 MG-UNIT per tablet Take 1 tablet by mouth 2 (two) times daily.    Yes [provider]  cetirizine (ZYRTEC) 10 MG tablet Take 10 mg by mouth daily.   Yes [provider]  FARXIGA 10 MG TABS tablet Take 10 mg by mouth daily. 07/23/14  Yes [provider]  glipiZIDE (GLUCOTROL XL) 10 MG 24 hr tablet  04/02/16   [provider]  latanoprost (XALATAN) 0.005 % ophthalmic solution Place 1 drop into both eyes at bedtime. 07/13/14   [provider]  losartan (COZAAR) 50 MG tablet Take 50 mg by mouth every morning. 07/19/14   [provider]  metoprolol (LOPRESSOR) 50 MG tablet TAKE 2 TABLETS BY MOUTH EVERY MORNING AND 1 EVERY EVENING Patient taking differently: Take 50-100 mg by mouth See admin instructions. Take 100 mg by mouth in the morning and 50 mg in the evening 12/30/15   Mar Daring, PA-C  Multiple Vitamin (MULTIVITAMIN) tablet Take 1 tablet by mouth daily.      [provider]  naproxen sodium (ANAPROX) 220  MG tablet Take 220 mg by mouth 2 (two) times daily as needed (pain).    [provider]  ondansetron (ZOFRAN) 4 MG tablet Take 4 mg by mouth every 8 (eight) hours as needed for nausea or vomiting.    [provider]  pantoprazole (PROTONIX) 40 MG tablet Take 40 mg by mouth daily.      [provider]  silver sulfADIAZINE (SILVADENE) 1 % cream Apply 1 application topically as needed. For wound care 07/13/14   [provider]  simvastatin (ZOCOR) 20 MG tablet Take 20 mg by mouth daily at 6 PM.  08/17/14   [provider]  TANZEUM 50 MG PEN Inject 50 mg into the skin every Monday. 07/23/14   [provider]  TOUJEO SOLOSTAR 300 UNIT/ML SOPN Inject 60 Units into the skin at bedtime. 08/17/14   [provider]  furosemide (LASIX) 40 MG tablet Take 40 mg by mouth 2 (two) times daily.    07/23/11  [provider]      Montey Hora, [facility] Pager: [phone]  or [phone] 01/15/2018, 3:28 AM

## 2018-01-15 NOTE — Consult Note (Signed)
[facility] for Infectious Disease    Date of Admission:  01/14/2018   Total days of antibiotics 2        Day 1 cefazolin               Reason for Consult: MSSA bacteremia    Referring Provider: Agarwala Primary Care Provider: Burt Ek   Assessment: 62 yo male with diabetes and prosthetic aortic valve, who presented with AMS and vomiting, has a right diabetic foot infection complicated by MSSA bacteremia. We highly suspect endocarditis of his prosthetic aortic valve because the thrombocytopenia and splenic infarct, and thus warrants an echocardiogram. We are also concerned he has osteomyelitis of his right foot and possibly his right wrist, given his history of osteomyelitis, and would like to evaluate that with an MRI. We will narrow his antibiotics from vanc/cefepime/flagyl to cefazolin to treat the MSSA bacteremia. He will need 6 weeks of IV antibiotics. If he does have endocarditis, we plan to later add rifampin for 6 weeks and gentamycin for 2 weeks.   Plan: 1. Start cefazolin 2. TTE. If inconclusive, will get TEE. 3.  MRI of right foot. Will wait to MRI right wrist. 4. Repeat BCx 5.   Principal Problem:   Severe sepsis ([place]) Active Problems:   Insulin-requiring or dependent type II diabetes mellitus ([place])   CAD (coronary artery disease)   Thrombocytopenia ([facility])   Splenic infarct   Renal insufficiency   Right foot infection   Sepsis ([place])   Scheduled Meds: . aspirin  325 mg Oral Daily  . calcium-vitamin D  1 tablet Oral BID  . heparin  5,000 Units Subcutaneous Q8H  . insulin aspart  0-5 Units Subcutaneous QHS  . insulin aspart  0-9 Units Subcutaneous TID WC  . insulin glargine  30 Units Subcutaneous QHS  . latanoprost  1 drop Both Eyes QHS  . mouth rinse  15 mL Mouth Rinse BID  . multivitamin with minerals  1 tablet Oral Daily  . pantoprazole  40 mg Oral Daily  . simvastatin  20 mg Oral q1800  . sodium chloride flush  3 mL Intravenous Q12H    Continuous Infusions: .  ceFAZolin (ANCEF) IV     PRN Meds:.acetaminophen **OR** acetaminophen, fentaNYL (SUBLIMAZE) injection, ondansetron **OR** ondansetron (ZOFRAN) IV  HPI: [name] is a 62 y.o. male with a pertinent history of diabetes, CAD s/p 5-vessel bypass and aortic valve replacement in 2010, history of bacteremia and osteomyelitis from diabetic wound infections s/p L BKA and R toes amputations, who presented with altered mental status and vomiting.  He reports starting to feel bad on Monday 9/9 after dinner. Then on Tuesday 9/10 morning he had a headache, neck and shoulder pain, and developed little appetite and nausea. He then had several episodes of NBNB vomiting and dry heaving. He later worsened, became confused and was brought into the ED by his niece. He reports subjective fever, cough, and abdominal pain. He denies chills, sweats, neck stiffness, photophobia, sob, chest pain, or any urinary symptoms. In the ED, he was found to have a right foot infection. He reports that he noticed the wound on Saturday 9/7 when his sock became stained. He cannot recall when his got the wound because he has no sensation in that foot. He has a history of left leg wound infection that developed into bone infection and then bacteremia several years ago, and he subsequently had to get a below the knee  amputation. About 10 years ago, he also developed osteomyelitis in his right foot and had to have all his toes amputated. In 2010 he underwent 5-vessel bypass surgery for CAD and an bovine aortic valve replacement. He denies having heart valve infections in the past. He denies IV drug use.    Today, he is feeling better and no longer feels confused. He also complains of right wrist pain that began last night.    Review of Systems: Review of Systems  Constitutional: Positive for fever and malaise/fatigue. Negative for chills and diaphoresis.  HENT: Negative.   Eyes: Negative.   Respiratory:  Positive for cough. Negative for hemoptysis, sputum production and shortness of breath.   Cardiovascular: Negative.   Gastrointestinal: Positive for nausea and vomiting. Negative for abdominal pain and diarrhea.  Genitourinary: Negative.   Musculoskeletal: Negative.   Neurological: Positive for headaches. Negative for focal weakness.  Psychiatric/Behavioral: Negative for substance abuse.    Past Medical History:  Diagnosis Date  . Bicuspid aortic valve    a. 02/2009 s/p tissue AVR;  b. 07/2014 Echo: EF 55-60%, basal-mid inf HK, mild AS/MR, mod dil LA, PASP 44 mmHg.  Marland Kitchen CAD (coronary artery disease) October 2010   a. 02/2009 NSTEMI/Cath: 3VD; b. 02/2009 CABG x 5: LIMA->LAD, VG->Diag, VG->RI, VG->RVM->RPDA.  . Diabetes mellitus    Type II  . GERD (gastroesophageal reflux disease)   . Heart murmur   . Hyperlipidemia   . Hypertension   . PVD (peripheral vascular disease) ([place])    a. s/p toe amputations on L foot.    Social History   Tobacco Use  . Smoking status: Former Smoker    Years: 1.00    Last attempt to quit: 10/15/2000    Years since quitting: 17.2  Substance Use Topics  . Alcohol use: No    Comment: Rarely  . Drug use: No    Family History  Problem Relation Age of Onset  . Heart attack Father        early age  . Heart disease Father   . Stroke Father   . Heart attack Brother        At age 41  . Stroke Brother   . Diabetes type II Mother   . Diabetes type II Sister   . Diabetes Unknown   . Coronary artery disease Unknown    Allergies  Allergen Reactions  . Ace Inhibitors Cough    OBJECTIVE: Blood pressure (!) 87/58, pulse 87, temperature 97.8 F (36.6 C), temperature source Oral, resp. rate 19, height 5\' 10"  (1.778 m), weight 96.1 kg, SpO2 97 %.  Physical Exam  Constitutional: He is oriented to person, place, and time. He appears well-developed. No distress.  HENT:  Mouth/Throat: Oropharynx is clear and moist. No oropharyngeal exudate.  Eyes: Pupils  are equal, round, and reactive to light. Conjunctivae and EOM are normal. No scleral icterus.  Neck: Normal range of motion. Neck supple.  Cardiovascular: Normal rate, regular rhythm, normal heart sounds and intact distal pulses.  No murmur heard. No janeway lesions, osler nodes, or splinter hemorrhages noted.   Pulmonary/Chest: Effort normal and breath sounds normal.  Abdominal: Soft. Bowel sounds are normal. He exhibits no distension. There is no tenderness.  Musculoskeletal: He exhibits deformity.  R wrist: mildly tender, no redness or swelling L BKA R toes 1-5 amputation  Feet:  Right Foot:  Skin Integrity: Positive for ulcer, skin breakdown, erythema and warmth.  Left Foot: amputated Lymphadenopathy:    He has no  cervical adenopathy.  Neurological: He is alert and oriented to person, place, and time. No cranial nerve deficit.  Skin: Rash noted. There is erythema.  R foot wound in lateral aspect around metatarsal 5, superficial ulcer, warm, red, no fluctuance. Non tender. Sensation of all foot absent.     Lab Results Lab Results  Component Value Date   WBC 23.1 (H) 01/15/2018   HGB 12.1 (L) 01/15/2018   HCT 37.8 (L) 01/15/2018   MCV 89.6 01/15/2018   PLT 84 (L) 01/15/2018    Lab Results  Component Value Date   CREATININE 1.75 (H) 01/15/2018   BUN 26 (H) 01/15/2018   NA 138 01/15/2018   K 4.7 01/15/2018   CL 109 01/15/2018   CO2 15 (L) 01/15/2018    Lab Results  Component Value Date   ALT 17 01/15/2018   AST 32 01/15/2018   ALKPHOS 47 01/15/2018   BILITOT 0.8 01/15/2018     Microbiology: Recent Results (from the past 240 hour(s))  Blood Culture (routine x 2)     Status: None (Preliminary result)   Collection Time: 01/14/18  8:10 PM  Result Value Ref Range Status   Specimen Description BLOOD RIGHT ANTECUBITAL  Final   Special Requests   Final    BOTTLES DRAWN AEROBIC AND ANAEROBIC Blood Culture adequate volume   Culture  Setup Time   Final    GRAM POSITIVE  COCCI IN BOTH AEROBIC AND ANAEROBIC BOTTLES Organism ID to follow CRITICAL RESULT CALLED TO, READ BACK BY AND VERIFIED WITH: Ellin Mayhew [facility] [id number] MLM Performed at [facility], [place] [address], [place], [place] [address]    Culture GRAM POSITIVE COCCI  Final   Report Status PENDING  Incomplete  Blood Culture ID Panel (Reflexed)     Status: Abnormal   Collection Time: 01/14/18  8:10 PM  Result Value Ref Range Status   Enterococcus species NOT DETECTED NOT DETECTED Final   Listeria monocytogenes NOT DETECTED NOT DETECTED Final   Staphylococcus species DETECTED (A) NOT DETECTED Final    Comment: CRITICAL RESULT CALLED TO, READ BACK BY AND VERIFIED WITH: PHARMD M PHAM [id number] MLM    Staphylococcus aureus DETECTED (A) NOT DETECTED Final    Comment: Methicillin (oxacillin) susceptible Staphylococcus aureus (MSSA). Preferred therapy is anti staphylococcal beta lactam antibiotic (Cefazolin or Nafcillin), unless clinically contraindicated. CRITICAL RESULT CALLED TO, READ BACK BY AND VERIFIED WITH: PHARMD M PHAM [id number] MLM    Methicillin resistance NOT DETECTED NOT DETECTED Final   Streptococcus species NOT DETECTED NOT DETECTED Final   Streptococcus agalactiae NOT DETECTED NOT DETECTED Final   Streptococcus pneumoniae NOT DETECTED NOT DETECTED Final   Streptococcus pyogenes NOT DETECTED NOT DETECTED Final   Acinetobacter baumannii NOT DETECTED NOT DETECTED Final   Enterobacteriaceae species NOT DETECTED NOT DETECTED Final   Enterobacter cloacae complex NOT DETECTED NOT DETECTED Final   Escherichia coli NOT DETECTED NOT DETECTED Final   Klebsiella oxytoca NOT DETECTED NOT DETECTED Final   Klebsiella pneumoniae NOT DETECTED NOT DETECTED Final   Proteus species NOT DETECTED NOT DETECTED Final   Serratia marcescens NOT DETECTED NOT DETECTED Final   Haemophilus influenzae NOT DETECTED NOT DETECTED Final   Neisseria meningitidis NOT DETECTED NOT DETECTED Final   Pseudomonas  aeruginosa NOT DETECTED NOT DETECTED Final   Candida albicans NOT DETECTED NOT DETECTED Final   Candida glabrata NOT DETECTED NOT DETECTED Final   Candida krusei NOT DETECTED NOT DETECTED Final   Candida parapsilosis  NOT DETECTED NOT DETECTED Final   Candida tropicalis NOT DETECTED NOT DETECTED Final    Comment: Performed at [facility], [place] [address], [place], [place] [address]  Blood Culture (routine x 2)     Status: None (Preliminary result)   Collection Time: 01/14/18  8:20 PM  Result Value Ref Range Status   Specimen Description BLOOD RIGHT FOREARM  Final   Special Requests   Final    BOTTLES DRAWN AEROBIC AND ANAEROBIC Blood Culture adequate volume   Culture  Setup Time   Final    GRAM POSITIVE COCCI IN BOTH AEROBIC AND ANAEROBIC BOTTLES CRITICAL VALUE NOTED.  VALUE IS CONSISTENT WITH PREVIOUSLY REPORTED AND CALLED VALUE. Performed at [facility], [place] [address], [place], [place] [address]    Culture GRAM POSITIVE COCCI  Final   Report Status PENDING  Incomplete  MRSA PCR Screening     Status: None   Collection Time: 01/15/18  4:41 AM  Result Value Ref Range Status   MRSA by PCR NEGATIVE NEGATIVE Final    Comment:        The GeneXpert MRSA Assay (FDA approved for NASAL specimens only), is one component of a comprehensive MRSA colonization surveillance program. It is not intended to diagnose MRSA infection nor to guide or monitor treatment for MRSA infections. Performed at [facility], [place] [address] [place], [place] [address]     Durenda Hurt, [facility] [phone] pager    01/15/2018, 2:35 PM

## 2018-01-16 ENCOUNTER — Inpatient Hospital Stay (HOSPITAL_COMMUNITY): Payer: BLUE CROSS/BLUE SHIELD

## 2018-01-16 ENCOUNTER — Ambulatory Visit (INDEPENDENT_AMBULATORY_CARE_PROVIDER_SITE_OTHER): Payer: Self-pay | Admitting: Physician Assistant

## 2018-01-16 DIAGNOSIS — L97519 Non-pressure chronic ulcer of other part of right foot with unspecified severity: Secondary | ICD-10-CM

## 2018-01-16 DIAGNOSIS — R5381 Other malaise: Secondary | ICD-10-CM

## 2018-01-16 DIAGNOSIS — R197 Diarrhea, unspecified: Secondary | ICD-10-CM

## 2018-01-16 DIAGNOSIS — R109 Unspecified abdominal pain: Secondary | ICD-10-CM

## 2018-01-16 DIAGNOSIS — E11649 Type 2 diabetes mellitus with hypoglycemia without coma: Secondary | ICD-10-CM

## 2018-01-16 LAB — CBC
HCT: 37.8 % — ABNORMAL LOW (ref 39.0–52.0)
HEMOGLOBIN: 12.5 g/dL — AB (ref 13.0–17.0)
MCH: 28.8 pg (ref 26.0–34.0)
MCHC: 33.1 g/dL (ref 30.0–36.0)
MCV: 87.1 fL (ref 78.0–100.0)
Platelets: 80 10*3/uL — ABNORMAL LOW (ref 150–400)
RBC: 4.34 MIL/uL (ref 4.22–5.81)
RDW: 13.6 % (ref 11.5–15.5)
WBC: 15.4 10*3/uL — ABNORMAL HIGH (ref 4.0–10.5)

## 2018-01-16 LAB — SEDIMENTATION RATE: SED RATE: 30 mm/h — AB (ref 0–16)

## 2018-01-16 LAB — BASIC METABOLIC PANEL
ANION GAP: 7 (ref 5–15)
BUN: 34 mg/dL — ABNORMAL HIGH (ref 8–23)
CALCIUM: 7.4 mg/dL — AB (ref 8.9–10.3)
CO2: 19 mmol/L — AB (ref 22–32)
CREATININE: 1.27 mg/dL — AB (ref 0.61–1.24)
Chloride: 112 mmol/L — ABNORMAL HIGH (ref 98–111)
GFR calc Af Amer: >60 mL/min (ref >60)
GFR, EST NON AFRICAN AMERICAN: 59 mL/min — AB (ref >60)
Glucose, Bld: 90 mg/dL (ref 70–99)
Potassium: 3.6 mmol/L (ref 3.5–5.1)
Sodium: 138 mmol/L (ref 135–145)

## 2018-01-16 LAB — URINE CULTURE

## 2018-01-16 LAB — UREA NITROGEN, URINE: UREA NITROGEN UR: 297 mg/dL

## 2018-01-16 LAB — GLUCOSE, CAPILLARY
GLUCOSE-CAPILLARY: 72 mg/dL (ref 70–99)
Glucose-Capillary: 66 mg/dL — ABNORMAL LOW (ref 70–99)
Glucose-Capillary: 91 mg/dL (ref 70–99)
Glucose-Capillary: 85 mg/dL (ref 70–99)

## 2018-01-16 LAB — C-REACTIVE PROTEIN: CRP: 15.6 mg/dL — AB (ref <1.0)

## 2018-01-16 LAB — HEMOGLOBIN A1C
Hgb A1c MFr Bld: 8.5 % — ABNORMAL HIGH (ref 4.8–5.6)
Mean Plasma Glucose: 197.25 mg/dL

## 2018-01-16 LAB — PHOSPHORUS: Phosphorus: 1.9 mg/dL — ABNORMAL LOW (ref 2.5–4.6)

## 2018-01-16 LAB — MAGNESIUM: MAGNESIUM: 2 mg/dL (ref 1.7–2.4)

## 2018-01-16 MED ORDER — GADOBUTROL 1 MMOL/ML IV SOLN
10.0000 mL | Freq: Once | INTRAVENOUS | Status: AC | PRN
  Administered 2018-01-16: 8 mL via INTRAVENOUS

## 2018-01-16 MED ORDER — SUCRALFATE 1 G PO TABS
1.0000 g | ORAL_TABLET | Freq: Three times a day (TID) | ORAL | Status: DC
  Administered 2018-01-16 – 2018-01-22 (×22): 1 g via ORAL
  Filled 2018-01-16 (×22): qty 1

## 2018-01-16 MED ORDER — SODIUM CHLORIDE 0.45 % IV SOLN
INTRAVENOUS | Status: DC
  Administered 2018-01-16: 18:00:00 via INTRAVENOUS

## 2018-01-16 NOTE — Progress Notes (Signed)
Patient ID: [name], male   DOB: 08-31-1955, 62 y.o.   MRN: [id number] Patient states that he had a bad night last night.  He states his blood sugars dropped to 85 he was given some ice cream and subsequently he developed GI upset and had diarrhea through the night.  Plan for transtibial amputation tomorrow Friday on the right.  All questions were answered patient is in agreement of proceeding with surgery.

## 2018-01-16 NOTE — Progress Notes (Signed)
PROGRESS NOTE    [name]  DJM:[id number] DOB: 10-19-1955 DOA: 01/14/2018 PCP: [name], MD    Brief Narrative:  62 year old male who presented with malaise, vomiting, lower abdominal pain and right foot wound bleeding.  He does have the significant past medical history for coronary artery disease, bioprosthetic aortic valve replacement, type 2 diabetes mellitus, peripheral vascular disease status post left BKA and right TMA.  He had generalized malaise, associated with nausea, vomiting and abdominal pain, positive bleeding on his right foot wound ulcer.  On his initial physical examination he was tachypneic, tachycardic 120 bpm, hypotensive 90/60.  Ill looking appearing, dry mucous membranes, lungs clear to auscultation, heart S1-S2 present, tachycardic, abdomen with generalized tenderness, right mid foot ulcerated wound, with surrounding edema and erythema.  Sodium 135, potassium 3.8, chloride 100, bicarb 19, glucose 182, BUN 19, creatinine 1.84, white count 19.7, hemoglobin 15.7, hematocrit 47.7, platelets 124, venous lactic acid 6.2, urinalysis with 30 protein, specific gravity 1.045, urinary sodium less than 10.  Head CT negative for acute changes.  Chest radiograph with left lower lobe atelectasis.  CT of the abdomen with peripheral wedge-shaped defect in the spleen consistent with splenic infarct.  Patient was admitted to the hospital with the working diagnosis of sepsis due to right foot cellulitis, complicated by acute kidney injury.  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 Sepsis due to right diabetic foot osteomyelitis/ MSSA bacteremia. Continue IV antibiotic therapy with cefazolin, patient has splenic infarcts and history of bioprosthetic aortic. Transthoracic echocardiography with no vegetation, patient will have TEE in am to rule out endocarditis. Plan for transtibial amputation in am.   2. Pre-renal AKI. Patient received IV fluids, blood pressure systolic 222 to 979 mmHg systolic, renal function this am with a serum cr at 1,27 with K at 3,6 and serum bicarbonate at 19. Peak cr at 1,84. Will continue to avoid nephrotoxic medications and hypotension. Cl at 112. Will continue hydration with half normal saline at 50 ml per hour. Follow on renal panel in am.   3. T2DM with hypoglycemia. Very poor oral intake with dysphagia, will change to full liquid diet and will hold on basal insulin, continue with insulin sliding scale for glucose cover and monitoring. Capillary glucose 135, 85, 72, 66. Fasting glucose 90 mg/dl.   4. Coronary artery disease. No chest pain, continue aspirin and statin therapy with simvastatin.   5. Dysphagia. Will change diet to full liquids, will add sucralfate and continue antiacid therapy with pantoprazole.    DVT prophylaxis: heparin   Code Status:  full Family Communication: I spoke with patient's brother at the bedside and all questions were addressed.  Disposition Plan/ discharge barriers: pending surgical intervention    Consultants:   ID  Orthopedics  Procedures:     Antimicrobials:   IV Cefazolin    Subjective: Patient not feeling well in general, has difficulty eating, unable to pass solid food. No nausea or vomiting, no abdominal pain, no chest pain or dyspnea.   Objective: Vitals:   01/15/18  1900 01/16/18 0500 01/16/18 0502 01/16/18 1435  BP: (!) 93/54  108/67 132/81  Pulse: (!) 101  (!) 102 (!) 108  Resp: (!) 21  18 16   Temp:   98.2 F (36.8 C)  98.4 F (36.9 C)  TempSrc:   Oral Oral  SpO2: 98%  95% 97%  Weight:  95.2 kg    Height:        Intake/Output Summary (Last 24 hours) at 01/16/2018 1550 Last data filed at 01/16/2018 1300 Gross per 24 hour  Intake 240 ml  Output -  Net 240 ml   Filed Weights   01/14/18 2012 01/15/18 0500 01/16/18 0500  Weight: 99.8 kg 96.1 kg 95.2 kg    Examination:   General: deconditioned and ill looking appearing  Neurology: Awake and alert, non focal  E ENT: positive pallor, no icterus, oral mucosa moist Cardiovascular: No JVD. S1-S2 present, rhythmic, no gallops, rubs, or murmurs.  Left below the knee amputation, right foot with ulcerated lesion.  Pulmonary: positive breath sounds bilaterally, decreased air movement, no wheezing, rhonchi or rales. Gastrointestinal. Abdomen with no organomegaly, non tender, no rebound or guarding Skin. Right foot medial large ulcerated lesion, un-stageable.  Musculoskeletal: left BKA     Data Reviewed: I have personally reviewed following labs and imaging studies  CBC: Recent Labs  Lab 01/14/18 1926 01/15/18 0246 01/16/18 0327  WBC 19.7* 23.1* 15.4*  NEUTROABS 17.9* 21.1*  --   HGB 15.7 12.1* 12.5*  HCT 47.7 37.8* 37.8*  MCV 88.0 89.6 87.1  PLT 124* 84* 80*   Basic Metabolic Panel: Recent Labs  Lab 01/14/18 1926 01/15/18 0246 01/15/18 0602 01/16/18 0327  NA 135 136 138 138  K 3.8 4.4 4.7 3.6  CL 100 111 109 112*  CO2 19* 15* 15* 19*  GLUCOSE 182* 195* 210* 90  BUN 19 24* 26* 34*  CREATININE 1.84* 1.78* 1.75* 1.27*  CALCIUM 9.0 7.1* 7.2* 7.4*  MG  --   --   --  2.0  PHOS  --   --   --  1.9*   GFR: Estimated Creatinine Clearance: 70.8 mL/min (A) (by C-G formula based on SCr of 1.27 mg/dL (H)). Liver Function Tests: Recent Labs  Lab 01/14/18 1926 01/15/18 0246  AST 36 32  ALT 20 17  ALKPHOS 78 47  BILITOT 1.4* 0.8  PROT 7.1 5.0*  ALBUMIN 3.8 2.6*   Recent Labs  Lab 01/14/18 1926  LIPASE 23   Recent Labs  Lab  01/14/18 1927  AMMONIA 36*   Coagulation Profile: Recent Labs  Lab 01/14/18 1926 01/15/18 0246  INR 1.43 1.72   Cardiac Enzymes: No results for input(s): CKTOTAL, CKMB, CKMBINDEX, TROPONINI in the last 168 hours. BNP (last 3 results) No results for input(s): PROBNP in the last 8760 hours. HbA1C: Recent Labs    01/16/18 0327  HGBA1C 8.5*   CBG: Recent Labs  Lab 01/15/18 0732 01/15/18 1115 01/15/18 1650 01/15/18 2119 01/16/18 1150  GLUCAP 224* 246* 135* 85 72   Lipid Profile: No results for input(s): CHOL, HDL, LDLCALC, TRIG, CHOLHDL, LDLDIRECT in the last 72 hours. Thyroid Function Tests: No results for input(s): TSH, T4TOTAL, FREET4, T3FREE, THYROIDAB in the last 72 hours. Anemia Panel: No results for input(s): VITAMINB12, FOLATE, FERRITIN, TIBC, IRON, RETICCTPCT in the last 72 hours.    Radiology Studies: I have reviewed all of the imaging during this hospital visit personally     Scheduled Meds: . aspirin  325 mg Oral Daily  .  calcium-vitamin D  1 tablet Oral BID  . heparin  5,000 Units Subcutaneous Q8H  . Influenza vac split quadrivalent PF  0.5 mL Intramuscular Tomorrow-1000  . insulin aspart  0-5 Units Subcutaneous QHS  . insulin aspart  0-9 Units Subcutaneous TID WC  . insulin glargine  30 Units Subcutaneous QHS  . latanoprost  1 drop Both Eyes QHS  . mouth rinse  15 mL Mouth Rinse BID  . multivitamin with minerals  1 tablet Oral Daily  . pantoprazole  40 mg Oral Daily  . simvastatin  20 mg Oral q1800  . sodium chloride flush  3 mL Intravenous Q12H  . sucralfate  1 g Oral TID WC & HS   Continuous Infusions: .  ceFAZolin (ANCEF) IV 2 g (01/15/18 2153)     LOS: 1 day        Tawni Millers, MD [facility] Pager [phone]

## 2018-01-16 NOTE — Progress Notes (Addendum)
PROGRESS NOTE [facility]   Brainards Camerer   GDJ:[id number] DOB: 05/05/1956  DOA: 01/14/2018 PCP: [name], MD   Brief Narrative:  [name] is a 62 yo male who presented to the ED night ago for 2-day hx of nausea, nonbilious vomiting, lower abdominal pain, AMS, slurred speech, and a necrotic wound on right foot. PMH significant for an aortic valve replacement, osteomyelitis, DM, CAD, PVD, s/p left transtibial amputation as well as amputation of the toes on his right foot. Patient was in septic shock and transferred to the ICU. Broad spectrum antibiotic while awaiting BCx which have since grown MSSA. ID narrowed abx to cefazolin and will repeat BCx. CXR demonstrated atelectasis and cardiomegaly, but no active respiratory process. CT abdomen revealed splenic infarct. Transthoracic echocardiogram performed in effort to r/o embolization from prosthetic valve endocarditis. Given his sepsis and artificial heart valve in addition to profound PVD, best option is to proceed with right transtibial amputation rather than attempt vascular reconstruction. Patient agreed with orthopedic recommendations and will proceed with surgery tomorrow. Transesophageal echocardiogram to also be performed tomorrow during transtibial amputation given high suspicion for PVE.    Subjective: Patient had upset GI and diarrhea last night. Today his nausea continues to persist and states that he has difficulty swallowing. Patient is understanding and agreeable to current management plans.    Assessment & Plan:   Principal Problem:   Severe sepsis ([place]) Active Problems:   Insulin-requiring or dependent type II diabetes mellitus ([place])   CAD (coronary artery disease)   Thrombocytopenia (HCC)   Splenic infarct   Renal insufficiency   Right foot infection   Sepsis ([place])   Prosthetic valve endocarditis (HCC)   Staphylococcus aureus bacteremia with sepsis (HCC)   Staphylococcal arthritis of right  wrist (HCC)   Acute hematogenous osteomyelitis, right ankle and foot (HCC)  Severe sepsis BCx revealed MSSA. Lactic acid, white count, and vital signs improved. -  Continue to treat with cefazolin  Suspected prosthetic valve endocarditis  Splenic infarct, patient presentation, and risk factor of prosthetic aortic valve highly suspicious for prosthetic valve endocarditis. TTE did not show vegetations, but not as sensitive as TEE. - TEE to be performed tomorrow morning - If positive for PVE, will add rifampin and gentamicin  Osteomyelitis & severe PVD An attempted foot salvage would be detrimental given patient's health status. - Transtibial amputation scheduled for tomorrow   Nausea and upset GI - Full clear liquid diet - Continue pantoprazole - Will add sucralfate  Insulin-controlled diabetes mellitus, with hypoglycemia Sugars have been low. - Discontinue baseline long-acting insulin coverage - Restart sliding scale insulin  DVT prophylaxis: Heparin Code Status: Full-code Family Communication: Randy, patient's brother, was present in room for discussion Disposition Plan: Continue to observe, transtibial amputation scheduled for tomorrow   Consultants:   Orthopedics  Infectious disease  Procedures:   None  Antimicrobials:  Cefazolin   Objective: Vitals:   01/15/18 1800 01/15/18 1900 01/16/18 0500 01/16/18 0502  BP: 121/67 (!) 93/54  108/67  Pulse: (!) 103 (!) 101  (!) 102  Resp: 18 (!) 21  18  Temp:    98.2 F (36.8 C)  TempSrc:    Oral  SpO2: 100% 98%  95%  Weight:   95.2 kg   Height:        Intake/Output Summary (Last 24 hours) at 01/16/2018 1037 Last data filed at 01/15/2018 1400 Gross per 24 hour  Intake 240 ml  Output 400 ml  Net -160 ml  Filed Weights   01/14/18 2012 01/15/18 0500 01/16/18 0500  Weight: 99.8 kg 96.1 kg 95.2 kg    Examination:  General exam: Appears calm and comfortable  HEENT: AC/AT, PERRLA, oral mucosa dry Respiratory  system: Clear to auscultation. No wheezes,crackle or rhonchi Cardiovascular system: S1 & S2 heard, RRR. No JVD, murmurs, rubs or gallops Gastrointestinal system: Abdomen is nondistended, soft and nontender. No organomegaly or masses felt. Normal bowel sounds heard. Central nervous system: Alert and oriented. No focal neurological deficits. Extremities: Left BKA, wound dressing right foot Skin: No rashes, lesions, or ulcers other than one on right foot Psychiatry: Judgment and insight appear normal. Mood & affect appropriate.    Data Reviewed: I have personally reviewed following labs and imaging studies  CBC: Recent Labs  Lab 01/14/18 1926 01/15/18 0246 01/16/18 0327  WBC 19.7* 23.1* 15.4*  NEUTROABS 17.9* 21.1*  --   HGB 15.7 12.1* 12.5*  HCT 47.7 37.8* 37.8*  MCV 88.0 89.6 87.1  PLT 124* 84* 80*   Basic Metabolic Panel: Recent Labs  Lab 01/14/18 1926 01/15/18 0246 01/15/18 0602 01/16/18 0327  NA 135 136 138 138  K 3.8 4.4 4.7 3.6  CL 100 111 109 112*  CO2 19* 15* 15* 19*  GLUCOSE 182* 195* 210* 90  BUN 19 24* 26* 34*  CREATININE 1.84* 1.78* 1.75* 1.27*  CALCIUM 9.0 7.1* 7.2* 7.4*  MG  --   --   --  2.0  PHOS  --   --   --  1.9*   GFR: Estimated Creatinine Clearance: 70.8 mL/min (A) (by C-G formula based on SCr of 1.27 mg/dL (H)). Liver Function Tests: Recent Labs  Lab 01/14/18 1926 01/15/18 0246  AST 36 32  ALT 20 17  ALKPHOS 78 47  BILITOT 1.4* 0.8  PROT 7.1 5.0*  ALBUMIN 3.8 2.6*   Recent Labs  Lab 01/14/18 1926  LIPASE 23   Recent Labs  Lab 01/14/18 1927  AMMONIA 36*   Coagulation Profile: Recent Labs  Lab 01/14/18 1926 01/15/18 0246  INR 1.43 1.72   Cardiac Enzymes: No results for input(s): CKTOTAL, CKMB, CKMBINDEX, TROPONINI in the last 168 hours. BNP (last 3 results) No results for input(s): PROBNP in the last 8760 hours. HbA1C: Recent Labs    01/16/18 0327  HGBA1C 8.5*   CBG: Recent Labs  Lab 01/15/18 0441 01/15/18 0732  01/15/18 1115 01/15/18 1650 01/15/18 2119  GLUCAP 239* 224* 246* 135* 85   Lipid Profile: No results for input(s): CHOL, HDL, LDLCALC, TRIG, CHOLHDL, LDLDIRECT in the last 72 hours. Thyroid Function Tests: No results for input(s): TSH, T4TOTAL, FREET4, T3FREE, THYROIDAB in the last 72 hours. Anemia Panel: No results for input(s): VITAMINB12, FOLATE, FERRITIN, TIBC, IRON, RETICCTPCT in the last 72 hours. Sepsis Labs: Recent Labs  Lab 01/14/18 1933 01/14/18 2027 01/15/18 0245 01/15/18 0602  LATICACIDVEN 6.24* 5.45* 3.0* 2.4*    Recent Results (from the past 240 hour(s))  Blood Culture (routine x 2)     Status: Abnormal (Preliminary result)   Collection Time: 01/14/18  8:10 PM  Result Value Ref Range Status   Specimen Description BLOOD RIGHT ANTECUBITAL  Final   Special Requests   Final    BOTTLES DRAWN AEROBIC AND ANAEROBIC Blood Culture adequate volume   Culture  Setup Time   Final    GRAM POSITIVE COCCI IN BOTH AEROBIC AND ANAEROBIC BOTTLES CRITICAL RESULT CALLED TO, READ BACK BY AND VERIFIED WITH: PHARMD M PHAM [id number] MLM  Culture (A)  Final    STAPHYLOCOCCUS AUREUS SUSCEPTIBILITIES TO FOLLOW Performed at [facility], [place] [address], [place], [place] [address]    Report Status PENDING  Incomplete  Blood Culture ID Panel (Reflexed)     Status: Abnormal   Collection Time: 01/14/18  8:10 PM  Result Value Ref Range Status   Enterococcus species NOT DETECTED NOT DETECTED Final   Listeria monocytogenes NOT DETECTED NOT DETECTED Final   Staphylococcus species DETECTED (A) NOT DETECTED Final    Comment: CRITICAL RESULT CALLED TO, READ BACK BY AND VERIFIED WITH: PHARMD M PHAM [id number] MLM    Staphylococcus aureus DETECTED (A) NOT DETECTED Final    Comment: Methicillin (oxacillin) susceptible Staphylococcus aureus (MSSA). Preferred therapy is anti staphylococcal beta lactam antibiotic (Cefazolin or Nafcillin), unless clinically contraindicated. CRITICAL  RESULT CALLED TO, READ BACK BY AND VERIFIED WITH: PHARMD M PHAM [id number] MLM    Methicillin resistance NOT DETECTED NOT DETECTED Final   Streptococcus species NOT DETECTED NOT DETECTED Final   Streptococcus agalactiae NOT DETECTED NOT DETECTED Final   Streptococcus pneumoniae NOT DETECTED NOT DETECTED Final   Streptococcus pyogenes NOT DETECTED NOT DETECTED Final   Acinetobacter baumannii NOT DETECTED NOT DETECTED Final   Enterobacteriaceae species NOT DETECTED NOT DETECTED Final   Enterobacter cloacae complex NOT DETECTED NOT DETECTED Final   Escherichia coli NOT DETECTED NOT DETECTED Final   Klebsiella oxytoca NOT DETECTED NOT DETECTED Final   Klebsiella pneumoniae NOT DETECTED NOT DETECTED Final   Proteus species NOT DETECTED NOT DETECTED Final   Serratia marcescens NOT DETECTED NOT DETECTED Final   Haemophilus influenzae NOT DETECTED NOT DETECTED Final   Neisseria meningitidis NOT DETECTED NOT DETECTED Final   Pseudomonas aeruginosa NOT DETECTED NOT DETECTED Final   Candida albicans NOT DETECTED NOT DETECTED Final   Candida glabrata NOT DETECTED NOT DETECTED Final   Candida krusei NOT DETECTED NOT DETECTED Final   Candida parapsilosis NOT DETECTED NOT DETECTED Final   Candida tropicalis NOT DETECTED NOT DETECTED Final    Comment: Performed at [facility], [address], [place], [place] [address]  Blood Culture (routine x 2)     Status: Abnormal (Preliminary result)   Collection Time: 01/14/18  8:20 PM  Result Value Ref Range Status   Specimen Description BLOOD RIGHT FOREARM  Final   Special Requests   Final    BOTTLES DRAWN AEROBIC AND ANAEROBIC Blood Culture adequate volume   Culture  Setup Time   Final    GRAM POSITIVE COCCI IN BOTH AEROBIC AND ANAEROBIC BOTTLES CRITICAL VALUE NOTED.  VALUE IS CONSISTENT WITH PREVIOUSLY REPORTED AND CALLED VALUE. Performed at [facility], [place] [address], [place], [place] [address]    Culture STAPHYLOCOCCUS AUREUS (A)   Final   Report Status PENDING  Incomplete  Urine culture     Status: Abnormal   Collection Time: 01/15/18  3:02 AM  Result Value Ref Range Status   Specimen Description URINE, CATHETERIZED  Final   Special Requests NONE  Final   Culture (A)  Final    <10,000 COLONIES/mL INSIGNIFICANT GROWTH Performed at [facility], [place] [address] [place],  [address]    Report Status 01/16/2018 FINAL  Final  MRSA PCR Screening     Status: None   Collection Time: 01/15/18  4:41 AM  Result Value Ref Range Status   MRSA by PCR NEGATIVE NEGATIVE Final    Comment:  The GeneXpert MRSA Assay (FDA approved for NASAL specimens only), is one component of a comprehensive MRSA colonization surveillance program. It is not intended to diagnose MRSA infection nor to guide or monitor treatment for MRSA infections. Performed at [facility], [place] [address] [place], [place] [address]   Culture, blood (Routine X 2) w Reflex to ID Panel     Status: None (Preliminary result)   Collection Time: 01/15/18 12:15 PM  Result Value Ref Range Status   Specimen Description BLOOD RIGHT ANTECUBITAL  Final   Special Requests   Final    BOTTLES DRAWN AEROBIC ONLY Blood Culture results may not be optimal due to an inadequate volume of blood received in culture bottles   Culture   Final    NO GROWTH < 24 HOURS Performed at [facility], [place] [address] [place], [place] [address]    Report Status PENDING  Incomplete  Culture, blood (Routine X 2) w Reflex to ID Panel     Status: None (Preliminary result)   Collection Time: 01/15/18 12:21 PM  Result Value Ref Range Status   Specimen Description BLOOD RIGHT ARM  Final   Special Requests   Final    BOTTLES DRAWN AEROBIC ONLY Blood Culture results may not be optimal due to an inadequate volume of blood received in culture bottles   Culture   Final    NO GROWTH < 24 HOURS Performed at [facility], [place] [address] [place], [place]  [address]    Report Status PENDING  Incomplete      Radiology Studies: Ct Head Wo Contrast  Result Date: 01/14/2018 CLINICAL DATA:  Unexplained altered level consciousness. Slurred speech. Nausea. Frontal headache. Nausea and vomiting. EXAM: CT HEAD WITHOUT CONTRAST TECHNIQUE: Contiguous axial images were obtained from the base of the skull through the vertex without intravenous contrast. COMPARISON:  07/27/2014 FINDINGS: Brain: No evidence of acute infarction, hemorrhage, hydrocephalus, extra-axial collection, or mass lesion/mass effect. Mild generalized cerebral atrophy, without significant change. Vascular:  No hyperdense vessel or other acute findings. Skull: No evidence of fracture or other significant bone abnormality. Sinuses/Orbits:  No acute findings. Other: None. IMPRESSION: No acute intracranial abnormality.  Mild cerebral atrophy. Electronically Signed   By: Earle Gell M.D.   On: 01/14/2018 20:05   Ct Abdomen Pelvis W Contrast  Result Date: 01/14/2018 CLINICAL DATA:  Acute abdominal pain. EXAM: CT ABDOMEN AND PELVIS WITH CONTRAST TECHNIQUE: Multidetector CT imaging of the abdomen and pelvis was performed using the standard protocol following bolus administration of intravenous contrast. CONTRAST:  153mL ISOVUE-300 IOPAMIDOL (ISOVUE-300) INJECTION 61% COMPARISON:  None. FINDINGS: Lower chest: Motion artifact through the lung bases. Mild dependent atelectasis on the left. Heart appears prominent size. There are coronary artery calcifications. Hepatobiliary: Decreased hepatic density consistent with steatosis. No discrete focal lesion. Hyperdensity at the gallbladder neck may be stones or sludge. No pericholecystic inflammation. No biliary dilatation. Pancreas: Fatty atrophy.  No ductal dilatation or inflammation. Spleen: Normal in size. Peripheral slightly wedge-shaped low-density in the posterior spleen measures approximately 4 cm in depth. No perisplenic fluid. Adrenals/Urinary Tract: Mild  thickening left adrenal gland without dominant nodule. Normal right adrenal gland. No hydronephrosis. Symmetric bilateral perinephric edema. Homogeneous renal enhancement. Possible small exophytic cyst from the mid left kidney versus perinephric edema. Urinary bladder is physiologically distended. No bladder wall thickening. Stomach/Bowel: Small hiatal hernia. Motion artifact and lack of enteric contrast limits detailed bowel assessment. Allowing for this, no evidence of obstruction, inflammation  or bowel wall thickening. Normal appendix. Vascular/Lymphatic: Moderate aorto bi-iliac atherosclerosis without aneurysm. Portal vein and mesenteric vessels are patent. No enlarged abdominal or pelvic lymph nodes. Reproductive: Prostate is unremarkable. Other: No free air or free fluid. Musculoskeletal: Scoliosis and degenerative change throughout the lumbar spine. Slight flattening of the left femoral head may be chronic. No acute osseous abnormality, particularly, no left rib fractures. IMPRESSION: 1. 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 No perisplenic fluid. 2. Symmetric bilateral perinephric edema, likely chronic but can be seen in the setting of urinary tract infection. 3. Gallstones or sludge without gallbladder inflammation on CT. 4. Incidental hepatic steatosis and Aortic Atherosclerosis (ICD10-I70.0). Electronically Signed   By: Keith Rake M.D.   On: 01/14/2018 22:19   Dg Chest Port 1 View  Result Date: 01/15/2018 CLINICAL DATA:  Severe sepsis. EXAM: PORTABLE CHEST 1 VIEW COMPARISON:  Radiograph 12/08/2014 FINDINGS: Stable cardiomegaly. Post CABG with prosthetic valve. No sternal wires visualized. No pulmonary edema. Scattered atelectasis. No confluent airspace disease, pleural effusion or pneumothorax. No acute osseous abnormalities. IMPRESSION: Chronic cardiomegaly with scattered atelectasis. Electronically Signed   By: Keith Rake M.D.   On: 01/15/2018 00:20    Dg Foot Complete Right  Result Date: 01/15/2018 CLINICAL DATA:  RIGHT foot infection, sepsis, prior amputation EXAM: RIGHT FOOT COMPLETE - 3+ VIEW COMPARISON:  04/06/2009 FINDINGS: Osseous demineralization. Prior transmetatarsal amputation. Joint spaces preserved. Mild dorsal spur formation at the talonavicular joint. No acute fracture, dislocation or bone destruction. Motion artifacts degrade lateral view, no gross osseous abnormalities identified. Scattered soft tissue swelling without soft tissue gas. IMPRESSION: Transmetatarsal amputation RIGHT foot. No acute osseous findings. Electronically Signed   By: Lavonia Dana M.D.   On: 01/15/2018 00:57      Scheduled Meds: . aspirin  325 mg Oral Daily  . calcium-vitamin D  1 tablet Oral BID  . heparin  5,000 Units Subcutaneous Q8H  . Influenza vac split quadrivalent PF  0.5 mL Intramuscular Tomorrow-1000  . insulin aspart  0-5 Units Subcutaneous QHS  . insulin aspart  0-9 Units Subcutaneous TID WC  . insulin glargine  30 Units Subcutaneous QHS  . latanoprost  1 drop Both Eyes QHS  . mouth rinse  15 mL Mouth Rinse BID  . multivitamin with minerals  1 tablet Oral Daily  . pantoprazole  40 mg Oral Daily  . simvastatin  20 mg Oral q1800  . sodium chloride flush  3 mL Intravenous Q12H   Continuous Infusions: .  ceFAZolin (ANCEF) IV 2 g (01/15/18 2153)     LOS: 1 day    Time spent: Total of 30 minutes spent with pt, greater than 50% of which was spent in discussion of  treatment, counseling and coordination of care    Krista Blue, PA-S Pager: Text Page via [email or web address]   If 7PM-7AM, please contact night-coverage [email or web address] 01/16/2018, 10:37 AM   Note - This record has been created using [employer]. Chart creation errors have been sought, but may not always have been located. Such creation errors do not reflect on the standard of medical care.

## 2018-01-16 NOTE — Progress Notes (Signed)
[name] for Infectious Disease  Date of Admission:  01/14/2018   Total days of antibiotics 3        Day 2 cefazolin         ASSESSMENT: 62 yo male who has MSSA bacteremia from a right diabetic foot infection. We suspect endocarditis of prosthetic aortic valve, though TTE did not show vegetations, and needs a TEE. If he does have endocarditis then he would need rifampin and gentamicin in addition. MRI did not show abscess or osteomyelitis. He is scheduled to undergo BKA of his right leg on 9/13 with Dr. Sharol Given.  PLAN: 1. Continue cefazolin 2. TEE. To be done in OR 9/13. 3. Dr. Sharol Given to perform right BKA 9/13. Appreciate his input.  Principal Problem:   Severe sepsis ([place]) Active Problems:   Insulin-requiring or dependent type II diabetes mellitus ([place])   CAD (coronary artery disease)   Thrombocytopenia (HCC)   Splenic infarct   Renal insufficiency   Right foot infection   Sepsis ([place])   Prosthetic valve endocarditis (HCC)   Staphylococcus aureus bacteremia with sepsis (HCC)   Staphylococcal arthritis of right wrist (HCC)   Acute hematogenous osteomyelitis, right ankle and foot ([place])   Scheduled Meds: . aspirin  325 mg Oral Daily  . calcium-vitamin D  1 tablet Oral BID  . heparin  5,000 Units Subcutaneous Q8H  . Influenza vac split quadrivalent PF  0.5 mL Intramuscular Tomorrow-1000  . insulin aspart  0-5 Units Subcutaneous QHS  . insulin aspart  0-9 Units Subcutaneous TID WC  . insulin glargine  30 Units Subcutaneous QHS  . latanoprost  1 drop Both Eyes QHS  . mouth rinse  15 mL Mouth Rinse BID  . multivitamin with minerals  1 tablet Oral Daily  . pantoprazole  40 mg Oral Daily  . simvastatin  20 mg Oral q1800  . sodium chloride flush  3 mL Intravenous Q12H   Continuous Infusions: .  ceFAZolin (ANCEF) IV 2 g (01/15/18 2153)   PRN Meds:.acetaminophen **OR** acetaminophen, fentaNYL (SUBLIMAZE) injection, ondansetron **OR** ondansetron (ZOFRAN)  IV   SUBJECTIVE: [name] reports feeling a little better today, but feels crummy. He reports having abdominal discomfort and one episode of watery diarrhea last night. He has not had further diarrhea. He has no other complaints. He has had two episodes of hypoglycemia, one last night and then again this morning. He has not been eating much since admission.   Review of Systems: Review of Systems  Constitutional: Positive for malaise/fatigue. Negative for chills, diaphoresis and fever.  Respiratory: Negative.   Cardiovascular: Negative.   Gastrointestinal: Positive for abdominal pain and diarrhea. Negative for nausea and vomiting.    Allergies  Allergen Reactions  . Ace Inhibitors Cough    OBJECTIVE: Vitals:   01/15/18 1800 01/15/18 1900 01/16/18 0500 01/16/18 0502  BP: 121/67 (!) 93/54  108/67  Pulse: (!) 103 (!) 101  (!) 102  Resp: 18 (!) 21  18  Temp:    98.2 F (36.8 C)  TempSrc:    Oral  SpO2: 100% 98%  95%  Weight:   95.2 kg   Height:       Body mass index is 30.11 kg/m.  Physical Exam  Constitutional: He is oriented to person, place, and time. He appears well-developed. No distress.  Eyes: Conjunctivae are normal.  Neck: Neck supple.  Cardiovascular: Normal rate, regular rhythm and normal heart sounds.  No murmur heard. Pulmonary/Chest: Effort  normal and breath sounds normal.  Abdominal: Soft. Bowel sounds are normal. He exhibits no distension. There is tenderness.  Musculoskeletal: He exhibits deformity. He exhibits no tenderness.  L BKA R toes 1-5 amputations   Neurological: He is alert and oriented to person, place, and time.  Skin: Skin is warm. There is erythema.  Right foot wound unchanged from yesterday: it is red with surrounding erythema, warm, nontender. Absent sensation.     Lab Results Lab Results  Component Value Date   WBC 15.4 (H) 01/16/2018   HGB 12.5 (L) 01/16/2018   HCT 37.8 (L) 01/16/2018   MCV 87.1 01/16/2018   PLT 80 (L)  01/16/2018    Lab Results  Component Value Date   CREATININE 1.27 (H) 01/16/2018   BUN 34 (H) 01/16/2018   NA 138 01/16/2018   K 3.6 01/16/2018   CL 112 (H) 01/16/2018   CO2 19 (L) 01/16/2018    Lab Results  Component Value Date   ALT 17 01/15/2018   AST 32 01/15/2018   ALKPHOS 47 01/15/2018   BILITOT 0.8 01/15/2018     Microbiology: Recent Results (from the past 240 hour(s))  Blood Culture (routine x 2)     Status: Abnormal (Preliminary result)   Collection Time: 01/14/18  8:10 PM  Result Value Ref Range Status   Specimen Description BLOOD RIGHT ANTECUBITAL  Final   Special Requests   Final    BOTTLES DRAWN AEROBIC AND ANAEROBIC Blood Culture adequate volume   Culture  Setup Time   Final    GRAM POSITIVE COCCI IN BOTH AEROBIC AND ANAEROBIC BOTTLES CRITICAL RESULT CALLED TO, READ BACK BY AND VERIFIED WITH: PHARMD M PHAM [id number] MLM    Culture (A)  Final    STAPHYLOCOCCUS AUREUS SUSCEPTIBILITIES TO FOLLOW Performed at [facility], [place] [address], [place], [place] [address]    Report Status PENDING  Incomplete  Blood Culture ID Panel (Reflexed)     Status: Abnormal   Collection Time: 01/14/18  8:10 PM  Result Value Ref Range Status   Enterococcus species NOT DETECTED NOT DETECTED Final   Listeria monocytogenes NOT DETECTED NOT DETECTED Final   Staphylococcus species DETECTED (A) NOT DETECTED Final    Comment: CRITICAL RESULT CALLED TO, READ BACK BY AND VERIFIED WITH: PHARMD M PHAM [id number] MLM    Staphylococcus aureus DETECTED (A) NOT DETECTED Final    Comment: Methicillin (oxacillin) susceptible Staphylococcus aureus (MSSA). Preferred therapy is anti staphylococcal beta lactam antibiotic (Cefazolin or Nafcillin), unless clinically contraindicated. CRITICAL RESULT CALLED TO, READ BACK BY AND VERIFIED WITH: PHARMD M PHAM [id number] MLM    Methicillin resistance NOT DETECTED NOT DETECTED Final   Streptococcus species NOT DETECTED NOT DETECTED Final    Streptococcus agalactiae NOT DETECTED NOT DETECTED Final   Streptococcus pneumoniae NOT DETECTED NOT DETECTED Final   Streptococcus pyogenes NOT DETECTED NOT DETECTED Final   Acinetobacter baumannii NOT DETECTED NOT DETECTED Final   Enterobacteriaceae species NOT DETECTED NOT DETECTED Final   Enterobacter cloacae complex NOT DETECTED NOT DETECTED Final   Escherichia coli NOT DETECTED NOT DETECTED Final   Klebsiella oxytoca NOT DETECTED NOT DETECTED Final   Klebsiella pneumoniae NOT DETECTED NOT DETECTED Final   Proteus species NOT DETECTED NOT DETECTED Final   Serratia marcescens NOT DETECTED NOT DETECTED Final   Haemophilus influenzae NOT DETECTED NOT DETECTED Final   Neisseria meningitidis NOT DETECTED NOT DETECTED Final   Pseudomonas aeruginosa NOT DETECTED NOT DETECTED Final  Candida albicans NOT DETECTED NOT DETECTED Final   Candida glabrata NOT DETECTED NOT DETECTED Final   Candida krusei NOT DETECTED NOT DETECTED Final   Candida parapsilosis NOT DETECTED NOT DETECTED Final   Candida tropicalis NOT DETECTED NOT DETECTED Final    Comment: Performed at [facility], [place] [address], [place], [place] [address]  Blood Culture (routine x 2)     Status: Abnormal (Preliminary result)   Collection Time: 01/14/18  8:20 PM  Result Value Ref Range Status   Specimen Description BLOOD RIGHT FOREARM  Final   Special Requests   Final    BOTTLES DRAWN AEROBIC AND ANAEROBIC Blood Culture adequate volume   Culture  Setup Time   Final    GRAM POSITIVE COCCI IN BOTH AEROBIC AND ANAEROBIC BOTTLES CRITICAL VALUE NOTED.  VALUE IS CONSISTENT WITH PREVIOUSLY REPORTED AND CALLED VALUE. Performed at [facility], [place] [address] [place], [place] [address]    Culture STAPHYLOCOCCUS AUREUS (A)  Final   Report Status PENDING  Incomplete  Urine culture     Status: Abnormal   Collection Time: 01/15/18  3:02 AM  Result Value Ref Range Status   Specimen Description URINE, CATHETERIZED  Final    Special Requests NONE  Final   Culture (A)  Final    <10,000 COLONIES/mL INSIGNIFICANT GROWTH Performed at [facility], [place] [address] [place], [place] [address]    Report Status 01/16/2018 FINAL  Final  MRSA PCR Screening     Status: None   Collection Time: 01/15/18  4:41 AM  Result Value Ref Range Status   MRSA by PCR NEGATIVE NEGATIVE Final    Comment:        The GeneXpert MRSA Assay (FDA approved for NASAL specimens only), is one component of a comprehensive MRSA colonization surveillance program. It is not intended to diagnose MRSA infection nor to guide or monitor treatment for MRSA infections. Performed at [facility], [place] [address], [place], [place] [address]   Culture, blood (Routine X 2) w Reflex to ID Panel     Status: None (Preliminary result)   Collection Time: 01/15/18 12:15 PM  Result Value Ref Range Status   Specimen Description BLOOD RIGHT ANTECUBITAL  Final   Special Requests   Final    BOTTLES DRAWN AEROBIC ONLY Blood Culture results may not be optimal due to an inadequate volume of blood received in culture bottles   Culture   Final    NO GROWTH < 24 HOURS Performed at [facility], [place] [address], [place], [place] [address]    Report Status PENDING  Incomplete  Culture, blood (Routine X 2) w Reflex to ID Panel     Status: None (Preliminary result)   Collection Time: 01/15/18 12:21 PM  Result Value Ref Range Status   Specimen Description BLOOD RIGHT ARM  Final   Special Requests   Final    BOTTLES DRAWN AEROBIC ONLY Blood Culture results may not be optimal due to an inadequate volume of blood received in culture bottles   Culture   Final    NO GROWTH < 24 HOURS Performed at [facility], [place] [address] [place], [place] [address]    Report Status PENDING  Incomplete    Durenda Hurt, [facility] [phone] pager    01/16/2018, 9:46 AM

## 2018-01-17 ENCOUNTER — Encounter (HOSPITAL_COMMUNITY)
Admission: EM | Disposition: A | Discharge status: Skilled Nursing Facility | Payer: Self-pay | Source: Home / Self Care | Attending: Internal Medicine

## 2018-01-17 ENCOUNTER — Inpatient Hospital Stay (HOSPITAL_COMMUNITY): Payer: BLUE CROSS/BLUE SHIELD | Admitting: Certified Registered"

## 2018-01-17 ENCOUNTER — Encounter (HOSPITAL_COMMUNITY): Payer: Self-pay

## 2018-01-17 DIAGNOSIS — T826XXS Infection and inflammatory reaction due to cardiac valve prosthesis, sequela: Secondary | ICD-10-CM

## 2018-01-17 HISTORY — PX: AMPUTATION: SHX166

## 2018-01-17 LAB — BASIC METABOLIC PANEL
Anion gap: 7 (ref 5–15)
BUN: 24 mg/dL — ABNORMAL HIGH (ref 8–23)
CALCIUM: 7.9 mg/dL — AB (ref 8.9–10.3)
CO2: 22 mmol/L (ref 22–32)
Chloride: 110 mmol/L (ref 98–111)
Creatinine, Ser: 1.09 mg/dL (ref 0.61–1.24)
Glucose, Bld: 96 mg/dL (ref 70–99)
Potassium: 4.7 mmol/L (ref 3.5–5.1)
SODIUM: 139 mmol/L (ref 135–145)

## 2018-01-17 LAB — C-REACTIVE PROTEIN: CRP: 11.8 mg/dL — ABNORMAL HIGH (ref <1.0)

## 2018-01-17 LAB — CBC WITH DIFFERENTIAL/PLATELET
Abs Immature Granulocytes: 0.1 10*3/uL (ref 0.0–0.1)
BASOS PCT: 0 %
Basophils Absolute: 0 10*3/uL (ref 0.0–0.1)
EOS ABS: 0 10*3/uL (ref 0.0–0.7)
Eosinophils Relative: 0 %
HCT: 38 % — ABNORMAL LOW (ref 39.0–52.0)
Hemoglobin: 12.6 g/dL — ABNORMAL LOW (ref 13.0–17.0)
IMMATURE GRANULOCYTES: 1 %
Lymphocytes Relative: 8 %
Lymphs Abs: 1 10*3/uL (ref 0.7–4.0)
MCH: 28.8 pg (ref 26.0–34.0)
MCHC: 33.2 g/dL (ref 30.0–36.0)
MCV: 87 fL (ref 78.0–100.0)
Monocytes Absolute: 1.1 10*3/uL — ABNORMAL HIGH (ref 0.1–1.0)
Monocytes Relative: 9 %
NEUTROS PCT: 82 %
Neutro Abs: 10.5 10*3/uL — ABNORMAL HIGH (ref 1.7–7.7)
PLATELETS: 86 10*3/uL — AB (ref 150–400)
RBC: 4.37 MIL/uL (ref 4.22–5.81)
RDW: 13.6 % (ref 11.5–15.5)
WBC: 12.7 10*3/uL — AB (ref 4.0–10.5)

## 2018-01-17 LAB — GLUCOSE, CAPILLARY
GLUCOSE-CAPILLARY: 97 mg/dL (ref 70–99)
GLUCOSE-CAPILLARY: 125 mg/dL — AB (ref 70–99)
GLUCOSE-CAPILLARY: 99 mg/dL (ref 70–99)
GLUCOSE-CAPILLARY: 100 mg/dL — AB (ref 70–99)
Glucose-Capillary: 107 mg/dL — ABNORMAL HIGH (ref 70–99)
Glucose-Capillary: 93 mg/dL (ref 70–99)

## 2018-01-17 LAB — CULTURE, BLOOD (ROUTINE X 2)
SPECIAL REQUESTS: ADEQUATE
Special Requests: ADEQUATE

## 2018-01-17 LAB — HCV COMMENT:

## 2018-01-17 LAB — HEPATITIS C ANTIBODY (REFLEX)

## 2018-01-17 SURGERY — AMPUTATION BELOW KNEE
Anesthesia: General | Site: Leg Lower | Laterality: Right

## 2018-01-17 MED ORDER — SODIUM CHLORIDE 0.9 % IV SOLN
INTRAVENOUS | Status: DC
  Administered 2018-01-17 – 2018-01-18 (×2): via INTRAVENOUS

## 2018-01-17 MED ORDER — LIDOCAINE 2% (20 MG/ML) 5 ML SYRINGE
INTRAMUSCULAR | Status: AC
  Filled 2018-01-17: qty 5

## 2018-01-17 MED ORDER — PROPOFOL 10 MG/ML IV BOLUS
INTRAVENOUS | Status: AC
  Filled 2018-01-17: qty 20

## 2018-01-17 MED ORDER — LIDOCAINE 2% (20 MG/ML) 5 ML SYRINGE
INTRAMUSCULAR | Status: DC | PRN
  Administered 2018-01-17: 60 mg via INTRAVENOUS

## 2018-01-17 MED ORDER — PROPOFOL 10 MG/ML IV BOLUS
INTRAVENOUS | Status: DC | PRN
  Administered 2018-01-17: 150 mg via INTRAVENOUS

## 2018-01-17 MED ORDER — OXYCODONE HCL 5 MG PO TABS
5.0000 mg | ORAL_TABLET | ORAL | Status: DC | PRN
  Administered 2018-01-17: 10 mg via ORAL
  Administered 2018-01-18 (×2): 5 mg via ORAL
  Administered 2018-01-18: 10 mg via ORAL
  Administered 2018-01-19: 5 mg via ORAL
  Filled 2018-01-17: qty 1
  Filled 2018-01-17: qty 2
  Filled 2018-01-17 (×2): qty 1
  Filled 2018-01-17: qty 2

## 2018-01-17 MED ORDER — MIDAZOLAM HCL 2 MG/2ML IJ SOLN
INTRAMUSCULAR | Status: AC
  Filled 2018-01-17: qty 2

## 2018-01-17 MED ORDER — DOCUSATE SODIUM 100 MG PO CAPS
100.0000 mg | ORAL_CAPSULE | Freq: Two times a day (BID) | ORAL | Status: DC
  Administered 2018-01-17: 100 mg via ORAL
  Filled 2018-01-17: qty 1

## 2018-01-17 MED ORDER — ONDANSETRON HCL 4 MG/2ML IJ SOLN
INTRAMUSCULAR | Status: DC | PRN
  Administered 2018-01-17: 4 mg via INTRAVENOUS

## 2018-01-17 MED ORDER — METOCLOPRAMIDE HCL 5 MG PO TABS: 5.0000 mg | ORAL_TABLET | Freq: Three times a day (TID) | ORAL | Status: DC | PRN

## 2018-01-17 MED ORDER — 0.9 % SODIUM CHLORIDE (POUR BTL) OPTIME
TOPICAL | Status: DC | PRN
  Administered 2018-01-17: 1000 mL

## 2018-01-17 MED ORDER — OXYCODONE HCL 5 MG PO TABS
10.0000 mg | ORAL_TABLET | ORAL | Status: DC | PRN
  Administered 2018-01-17: 15 mg via ORAL
  Filled 2018-01-17: qty 3

## 2018-01-17 MED ORDER — ONDANSETRON HCL 4 MG PO TABS: 4.0000 mg | ORAL_TABLET | Freq: Four times a day (QID) | ORAL | Status: DC | PRN

## 2018-01-17 MED ORDER — ACETAMINOPHEN 325 MG PO TABS: 325.0000 mg | ORAL_TABLET | Freq: Four times a day (QID) | ORAL | Status: DC | PRN

## 2018-01-17 MED ORDER — FENTANYL CITRATE (PF) 250 MCG/5ML IJ SOLN
INTRAMUSCULAR | Status: AC
  Filled 2018-01-17: qty 5

## 2018-01-17 MED ORDER — METHOCARBAMOL 1000 MG/10ML IJ SOLN
500.0000 mg | Freq: Four times a day (QID) | INTRAVENOUS | Status: DC | PRN
  Filled 2018-01-17: qty 5

## 2018-01-17 MED ORDER — POLYETHYLENE GLYCOL 3350 17 G PO PACK
17.0000 g | PACK | Freq: Every day | ORAL | Status: DC | PRN
  Filled 2018-01-17: qty 1

## 2018-01-17 MED ORDER — MIDAZOLAM HCL 5 MG/5ML IJ SOLN
INTRAMUSCULAR | Status: DC | PRN
  Administered 2018-01-17: 1 mg via INTRAVENOUS

## 2018-01-17 MED ORDER — ONDANSETRON HCL 4 MG/2ML IJ SOLN
4.0000 mg | Freq: Four times a day (QID) | INTRAMUSCULAR | Status: DC | PRN
  Administered 2018-01-19 (×2): 4 mg via INTRAVENOUS
  Filled 2018-01-17 (×2): qty 2

## 2018-01-17 MED ORDER — PHENYLEPHRINE 40 MCG/ML (10ML) SYRINGE FOR IV PUSH (FOR BLOOD PRESSURE SUPPORT)
PREFILLED_SYRINGE | INTRAVENOUS | Status: DC | PRN
  Administered 2018-01-17 (×2): 80 ug via INTRAVENOUS
  Administered 2018-01-17 (×2): 120 ug via INTRAVENOUS

## 2018-01-17 MED ORDER — SODIUM CHLORIDE 0.9 % IV SOLN
INTRAVENOUS | Status: DC
  Administered 2018-01-18: 10 mL/h via INTRAVENOUS

## 2018-01-17 MED ORDER — HYDROMORPHONE HCL 1 MG/ML IJ SOLN: 0.5000 mg | INTRAMUSCULAR | Status: DC | PRN

## 2018-01-17 MED ORDER — ONDANSETRON HCL 4 MG/2ML IJ SOLN
INTRAMUSCULAR | Status: AC
  Filled 2018-01-17: qty 2

## 2018-01-17 MED ORDER — BISACODYL 10 MG RE SUPP: 10.0000 mg | Freq: Every day | RECTAL | Status: DC | PRN

## 2018-01-17 MED ORDER — METOCLOPRAMIDE HCL 5 MG/ML IJ SOLN: 5.0000 mg | Freq: Three times a day (TID) | INTRAMUSCULAR | Status: DC | PRN

## 2018-01-17 MED ORDER — FENTANYL CITRATE (PF) 100 MCG/2ML IJ SOLN
INTRAMUSCULAR | Status: DC | PRN
  Administered 2018-01-17: 50 ug via INTRAVENOUS
  Administered 2018-01-17: 25 ug via INTRAVENOUS
  Administered 2018-01-17: 50 ug via INTRAVENOUS

## 2018-01-17 MED ORDER — LACTATED RINGERS IV SOLN
INTRAVENOUS | Status: DC
  Administered 2018-01-17: 11:00:00 via INTRAVENOUS

## 2018-01-17 MED ORDER — METHOCARBAMOL 500 MG PO TABS
500.0000 mg | ORAL_TABLET | Freq: Four times a day (QID) | ORAL | Status: DC | PRN
  Administered 2018-01-17 – 2018-01-20 (×6): 500 mg via ORAL
  Filled 2018-01-17 (×6): qty 1

## 2018-01-17 MED ORDER — MAGNESIUM CITRATE PO SOLN: 1.0000 | Freq: Once | ORAL | Status: DC | PRN

## 2018-01-17 SURGICAL SUPPLY — 42 items
APL SKNCLS STERI-STRIP NONHPOA (GAUZE/BANDAGES/DRESSINGS) ×4
BENZOIN TINCTURE PRP APPL 2/3 (GAUZE/BANDAGES/DRESSINGS) ×12 IMPLANT
BLADE SAW RECIP 87.9 MT (BLADE) ×3 IMPLANT
BLADE SURG 21 STRL SS (BLADE) ×3 IMPLANT
BNDG COHESIVE 6X5 TAN STRL LF (GAUZE/BANDAGES/DRESSINGS) ×6 IMPLANT
BNDG GAUZE ELAST 4 BULKY (GAUZE/BANDAGES/DRESSINGS) ×6 IMPLANT
COVER SURGICAL LIGHT HANDLE (MISCELLANEOUS) ×3 IMPLANT
CUFF TOURNIQUET SINGLE 34IN LL (TOURNIQUET CUFF) IMPLANT
CUFF TOURNIQUET SINGLE 44IN (TOURNIQUET CUFF) IMPLANT
DRAPE INCISE IOBAN 66X45 STRL (DRAPES) ×2 IMPLANT
DRAPE U-SHAPE 47X51 STRL (DRAPES) ×3 IMPLANT
DRESSING PREVENA PLUS CUSTOM (GAUZE/BANDAGES/DRESSINGS) ×1 IMPLANT
DRSG PREVENA PLUS CUSTOM (GAUZE/BANDAGES/DRESSINGS) ×6
DURAPREP 26ML APPLICATOR (WOUND CARE) ×3 IMPLANT
ELECT REM PT RETURN 9FT ADLT (ELECTROSURGICAL) ×3
ELECTRODE REM PT RTRN 9FT ADLT (ELECTROSURGICAL) ×1 IMPLANT
GLOVE BIOGEL PI IND STRL 7.5 (GLOVE) IMPLANT
GLOVE BIOGEL PI IND STRL 9 (GLOVE) ×1 IMPLANT
GLOVE BIOGEL PI INDICATOR 7.5 (GLOVE) ×2
GLOVE BIOGEL PI INDICATOR 9 (GLOVE) ×2
GLOVE SURG ORTHO 9.0 STRL STRW (GLOVE) ×3 IMPLANT
GLOVE SURG SS PI 6.5 STRL IVOR (GLOVE) ×2 IMPLANT
GOWN STRL REUS W/ TWL XL LVL3 (GOWN DISPOSABLE) ×2 IMPLANT
GOWN STRL REUS W/TWL XL LVL3 (GOWN DISPOSABLE) ×6
KIT BASIN OR (CUSTOM PROCEDURE TRAY) ×3 IMPLANT
KIT TURNOVER KIT B (KITS) ×3 IMPLANT
MANIFOLD NEPTUNE II (INSTRUMENTS) ×3 IMPLANT
NS IRRIG 1000ML POUR BTL (IV SOLUTION) ×3 IMPLANT
PACK ORTHO EXTREMITY (CUSTOM PROCEDURE TRAY) ×3 IMPLANT
PAD ARMBOARD 7.5X6 YLW CONV (MISCELLANEOUS) ×3 IMPLANT
PREVENA RESTOR ARTHOFORM 33X30 (CANNISTER) ×2 IMPLANT
SPONGE LAP 18X18 X RAY DECT (DISPOSABLE) IMPLANT
STAPLER VISISTAT 35W (STAPLE) IMPLANT
STOCKINETTE IMPERVIOUS LG (DRAPES) ×3 IMPLANT
SUT SILK 2 0 (SUTURE) ×3
SUT SILK 2-0 18XBRD TIE 12 (SUTURE) ×1 IMPLANT
SUT VIC AB 1 CTX 27 (SUTURE) ×4 IMPLANT
TOWEL OR 17X26 10 PK STRL BLUE (TOWEL DISPOSABLE) ×3 IMPLANT
TUBE CONNECTING 12'X1/4 (SUCTIONS) ×1
TUBE CONNECTING 12X1/4 (SUCTIONS) ×2 IMPLANT
WND VAC CANISTER 500ML (MISCELLANEOUS) ×2 IMPLANT
YANKAUER SUCT BULB TIP NO VENT (SUCTIONS) ×3 IMPLANT

## 2018-01-17 NOTE — Care Management Note (Signed)
Case Management Note  Patient Details  Name: [name] MRN: [id number] Date of Birth: 10/27/1955  Subjective/Objective:  Right AKA 01/17/2018                Action/Plan: NCM spoke to pt and states he will need medical records sent to his attorney who is assisting with his disability. Has his court date on 01/30/2018. Will have pt complete HIPAA form to consent to release medical records and take to medical records. Pt is requesting to go to rehab post dc. Will need PT/OT recommendations and insurance approval. SNF vs CIR. CSW referral for SNF.   Expected Discharge Date:                  Expected Discharge Plan:  Skilled Nursing Facility  In-House Referral:  Clinical Social Work  Discharge planning Services  CM Consult  Post Acute Care Choice:  NA Choice offered to:  NA  DME Arranged:  N/A DME Agency:  NA  HH Arranged:  NA HH Agency:  NA  Status of Service:  Completed, signed off  If discussed at [place] of Stay Meetings, dates discussed:    Additional Comments:  Erenest Rasher, RN 01/17/2018, 11:47 AM

## 2018-01-17 NOTE — Interval H&P Note (Signed)
History and Physical Interval Note:  01/17/2018 6:54 AM  [name]  has presented today for surgery, with the diagnosis of Gangrene Right Foot  The various methods of treatment have been discussed with the patient and family. After consideration of risks, benefits and other options for treatment, the patient has consented to  Procedure(s): RIGHT BELOW KNEE AMPUTATION (Right) as a surgical intervention .  The patient's history has been reviewed, patient examined, no change in status, stable for surgery.  I have reviewed the patient's chart and labs.  Questions were answered to the patient's satisfaction.     Newt Minion

## 2018-01-17 NOTE — Progress Notes (Signed)
Orthopedic Tech Progress Note Patient Details:  [name] 14-Jun-1955 [id number]  Patient ID: Gedalia Mcmillon, male   DOB: 1956-01-22, 62 y.o.   MRN: [id number]   [employer] for right stump shrinker and limb protector. 01/17/2018, 3:15 PM

## 2018-01-17 NOTE — Progress Notes (Signed)
[facility] for Infectious Disease    Date of Admission:  01/14/2018   Total days of antibiotics 4        Day 3 cefazolin   ID: [name] is a 62 y.o. male who presented with staph aureus bacteremia sepsis found to have DFU concern for osteo but also possible splenic infarct Principal Problem:   Severe sepsis ([place]) Active Problems:   Insulin-requiring or dependent type II diabetes mellitus ([place])   CAD (coronary artery disease)   Thrombocytopenia ([place])   Splenic infarct   Renal insufficiency   Right foot infection   Sepsis ([place])   Prosthetic valve endocarditis ([place])   Staphylococcus aureus bacteremia with sepsis ([place])   Staphylococcal arthritis of right wrist ([place])   Acute hematogenous osteomyelitis, right ankle and foot (HCC)    Subjective: Afebrile. Feeling better, less episodes of hypoglycemia. Underwent right leg TT amputation without difficulty. Unable to get TEE during procedure  Medications:  . aspirin  325 mg Oral Daily  . calcium-vitamin D  1 tablet Oral BID  . docusate sodium  100 mg Oral BID  . heparin  5,000 Units Subcutaneous Q8H  . Influenza vac split quadrivalent PF  0.5 mL Intramuscular Tomorrow-1000  . insulin aspart  0-9 Units Subcutaneous TID WC  . latanoprost  1 drop Both Eyes QHS  . multivitamin with minerals  1 tablet Oral Daily  . pantoprazole  40 mg Oral Daily  . simvastatin  20 mg Oral q1800  . sodium chloride flush  3 mL Intravenous Q12H  . sucralfate  1 g Oral TID WC & HS    Objective: Vital signs in last 24 hours: Temp:  [98.1 F (36.7 C)-98.8 F (37.1 C)] 98.1 F (36.7 C) (09/13 1430) Pulse Rate:  [90-105] 98 (09/13 1441) Resp:  [16-30] 20 (09/13 1430) BP: (84-145)/(55-89) 145/89 (09/13 1441) SpO2:  [94 %-98 %] 97 % (09/13 1441) Physical Exam  Constitutional: He is oriented to person, place, and time. He appears well-developed and well-nourished. No distress.  HENT:  Mouth/Throat: Oropharynx is clear and moist. No  oropharyngeal exudate.  Cardiovascular: Normal rate, regular rhythm and normal heart sounds. Exam reveals no gallop and no friction rub.  No murmur heard.  Pulmonary/Chest: Effort normal and breath sounds normal. No respiratory distress. He has no wheezes.  Abdominal: Soft. Bowel sounds are decreased. He exhibits no distension. There is no tenderness.  Ext: right leg wrapped. Hx of left BKA Neurological: He is alert and oriented to person, place, and time.  Skin: Skin is warm and dry. No rash noted. No erythema.  Psychiatric: He has a normal mood and affect. His behavior is normal.    Lab Results Recent Labs    01/16/18 0327 01/17/18 0355  WBC 15.4* 12.7*  HGB 12.5* 12.6*  HCT 37.8* 38.0*  NA 138 139  K 3.6 4.7  CL 112* 110  CO2 19* 22  BUN 34* 24*  CREATININE 1.27* 1.09   Liver Panel Recent Labs    01/14/18 1926 01/15/18 0246  PROT 7.1 5.0*  ALBUMIN 3.8 2.6*  AST 36 32  ALT 20 17  ALKPHOS 78 47  BILITOT 1.4* 0.8   Sedimentation Rate Recent Labs    01/16/18 0327  ESRSEDRATE 30*   C-Reactive Protein Recent Labs    01/16/18 0327 01/17/18 0355  CRP 15.6* 11.8*    Microbiology: 9/11 blood cx ngtd 9/10 blood cx MSSA Studies/Results: Mr Foot Right W Wo Contrast  Result Date: 01/16/2018  CLINICAL DATA:  Necrotic gangrenous ulcer on the lateral aspect of the right foot with ascending cellulitis. EXAM: MRI OF THE RIGHT FOREFOOT WITHOUT AND WITH CONTRAST TECHNIQUE: Multiplanar, multisequence MR imaging of the right foot was performed before and after the administration of intravenous contrast. CONTRAST:  10 cc Gadavist COMPARISON:  Radiographs 01/15/2018 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 metatarsal level. I do not see any marrow signal abnormality or enhancement to suggest osteomyelitis. No findings for septic arthritis. No subcutaneous soft tissue swelling/edema/fluid surrounding the foot suggesting cellulitis but no discrete  drainable soft tissue abscess or findings for pyomyositis. IMPRESSION: 1. Changes suggesting cellulitis but no discrete drainable soft tissue abscess. 2. No MR findings for septic arthritis, osteomyelitis or pyomyositis. Electronically Signed   By: Marijo Sanes M.D.   On: 01/16/2018 10:53     Assessment/Plan: Complicated staph aureus bacteremia = concern for endocarditis since imaging suggests possible splenic infarct. Would like to get TEE so that can determine if only need 2 wks of cefazolin 2gm IV Q8hr vs 6 wk. Please arrange for TEE  Bacteremia has cleared. Can place picc line  DFU = now have source control with his TT amputation.  aki = resolved  Dr Tommy Medal Available for questions this weekend. Will see back on [place] for Infectious Diseases Cell: [phone] Pager: [phone]  01/17/2018, 4:18 PM

## 2018-01-17 NOTE — Transfer of Care (Signed)
Immediate Anesthesia Transfer of Care Note  Patient: [name]  Procedure(s) Performed: RIGHT BELOW KNEE AMPUTATION (Right Leg Lower)  Patient Location: PACU  Anesthesia Type:General  Level of Consciousness: drowsy  Airway & Oxygen Therapy: Patient Spontanous Breathing and Patient connected to face mask oxygen  Post-op Assessment: Report given to RN and Post -op Vital signs reviewed and stable  Post vital signs: Reviewed and stable  Last Vitals:  Vitals Value Taken Time  BP 84/56 01/17/2018  1:08 PM  Temp    Pulse 92 01/17/2018  1:08 PM  Resp 17 01/17/2018  1:08 PM  SpO2 94 % 01/17/2018  1:08 PM  Vitals shown include unvalidated device data.  Last Pain:  Vitals:   01/17/18 0640  TempSrc:   PainSc: 7          Complications: No apparent anesthesia complications

## 2018-01-17 NOTE — Anesthesia Preprocedure Evaluation (Addendum)
Anesthesia Evaluation  Patient identified by MRN, date of birth, ID band Patient awake    Reviewed: Allergy & Precautions, NPO status , Patient's Chart, lab work & pertinent test results  History of Anesthesia Complications Negative for: history of anesthetic complications  Airway Mallampati: II  TM Distance: >3 FB Neck ROM: Full    Dental  (+) Missing, Dental Advisory Given   Pulmonary former smoker,  Quit soking 20 yrs ago   Pulmonary exam normal breath sounds clear to auscultation       Cardiovascular hypertension, Pt. on medications + CAD, + Past MI and + Peripheral Vascular Disease   Rhythm:Regular Rate:Normal  MI 2010   Neuro/Psych negative neurological ROS     GI/Hepatic Neg liver ROS, GERD  Medicated and Controlled,  Endo/Other  negative endocrine ROSdiabetes, Well Controlled, Type 2, Insulin Dependent, Oral Hypoglycemic Agents  Renal/GU negative Renal ROS     Musculoskeletal   Abdominal (+)  Abdomen: soft.    Peds  Hematology negative hematology ROS (+)   Anesthesia Other Findings   Reproductive/Obstetrics                             Anesthesia Physical  Anesthesia Plan  ASA: III  Anesthesia Plan: General   Post-op Pain Management:    Induction: Intravenous  PONV Risk Score and Plan: 2 and Ondansetron, Midazolam and Treatment may vary due to age or medical condition  Airway Management Planned: LMA  Additional Equipment:   Intra-op Plan:   Post-operative Plan: Extubation in OR  Informed Consent:   Dental advisory given  Plan Discussed with: CRNA, Anesthesiologist and Surgeon  Anesthesia Plan Comments:        Anesthesia Quick Evaluation

## 2018-01-17 NOTE — Anesthesia Postprocedure Evaluation (Signed)
Anesthesia Post Note  Patient: [name]  Procedure(s) Performed: RIGHT BELOW KNEE AMPUTATION (Right Leg Lower)     Patient location during evaluation: PACU Anesthesia Type: General Level of consciousness: awake and alert Pain management: pain level controlled Vital Signs Assessment: post-procedure vital signs reviewed and stable Respiratory status: spontaneous breathing, nonlabored ventilation and respiratory function stable Cardiovascular status: blood pressure returned to baseline and stable Postop Assessment: no apparent nausea or vomiting Anesthetic complications: no    Last Vitals:  Vitals:   01/17/18 1407 01/17/18 1415  BP: 135/84   Pulse: 100 (!) 105  Resp: (!) 22 17  Temp:    SpO2: 96% 97%    Last Pain:  Vitals:   01/17/18 1330  TempSrc:   PainSc: 0-No pain                 Lynda Rainwater

## 2018-01-17 NOTE — Plan of Care (Signed)
  Problem: Education: Goal: Knowledge of General Education information will improve Description Including pain rating scale, medication(s)/side effects and non-pharmacologic comfort measures Outcome: Progressing   Problem: Health Behavior/Discharge Planning: Goal: Ability to manage health-related needs will improve Outcome: Progressing   Problem: Clinical Measurements: Goal: Ability to maintain clinical measurements within normal limits will improve Outcome: Progressing Goal: Will remain free from infection Outcome: Progressing Goal: Diagnostic test results will improve Outcome: Progressing Goal: Respiratory complications will improve Outcome: Progressing Goal: Cardiovascular complication will be avoided Outcome: Progressing   Problem: Nutrition: Goal: Adequate nutrition will be maintained Outcome: Progressing   Problem: Coping: Goal: Level of anxiety will decrease Outcome: Progressing   Problem: Elimination: Goal: Will not experience complications related to bowel motility Outcome: Progressing Goal: Will not experience complications related to urinary retention Outcome: Progressing   Problem: Safety: Goal: Ability to remain free from injury will improve Outcome: Progressing   Problem: Pain Managment: Goal: General experience of comfort will improve Outcome: Progressing   Problem: Skin Integrity: Goal: Risk for impaired skin integrity will decrease Outcome: Progressing

## 2018-01-17 NOTE — Op Note (Signed)
   Date of Surgery: 01/17/2018  INDICATIONS: Mr. Enderle is a 62 y.o.-year-old male who has osteomyelitis and abscess right foot with septic that has seated the cardiac valve.  PREOPERATIVE DIAGNOSIS: osteomyelitis and abscess right foot  POSTOPERATIVE DIAGNOSIS: Same.  PROCEDURE: Transtibial amputation Application of Prevena wound VAC  SURGEON: Sharol Given, M.D.  ANESTHESIA:  general  IV FLUIDS AND URINE: See anesthesia.  ESTIMATED BLOOD LOSS: min mL.  COMPLICATIONS: None.  DESCRIPTION OF PROCEDURE: The patient was brought to the operating room and underwent a general anesthetic. After adequate levels of anesthesia were obtained patient's lower extremity was prepped using DuraPrep draped into a sterile field. A timeout was called. The foot was draped out of the sterile field with impervious stockinette. A transverse incision was made 11 cm distal to the tibial tubercle. This curved proximally and a large posterior flap was created. The tibia was transected 1 cm proximal to the skin incision. The fibula was transected just proximal to the tibial incision. The tibia was beveled anteriorly. A large posterior flap was created. The sciatic nerve was pulled cut and allowed to retract. The vascular bundles were suture ligated with 2-0 silk. The deep and superficial fascial layers were closed using #1 Vicryl. The skin was closed using staples and 2-0 nylon. The wound was covered with a Prevena wound VAC. There was a good suction fit. A prosthetic shrinker was applied. Patient was extubated taken to the PACU in stable condition.   DISCHARGE PLANNING:  Antibiotic duration: as needed for the cardiac valve  Weightbearing: NWB right  Pain medication: opoid pathway  Dressing care/ Wound LYY:[id number] vac for 2 weeks  Discharge to: SNF  Follow-up: In the office 1 week post operative.  Meridee Score, MD [place] 12:56 PM

## 2018-01-17 NOTE — Progress Notes (Signed)
PROGRESS NOTE    [name]  HAL:[id number] DOB: 1956/05/02 DOA: 01/14/2018 PCP: [name], MD    Brief Narrative:  62 year old male who presented with malaise, vomiting, lower abdominal pain and right foot wound bleeding.  He does have the significant past medical history for coronary artery disease, bioprosthetic aortic valve replacement, type 2 diabetes mellitus, peripheral vascular disease status post left BKA and right TMA.  He had generalized malaise, associated with nausea, vomiting and abdominal pain, positive bleeding on his right foot wound ulcer.  On his initial physical examination he was tachypneic, tachycardic 120 bpm, hypotensive 90/60.  Ill looking appearing, dry mucous membranes, lungs clear to auscultation, heart S1-S2 present, tachycardic, abdomen with generalized tenderness, right mid foot ulcerated wound, with surrounding edema and erythema.  Sodium 135, potassium 3.8, chloride 100, bicarb 19, glucose 182, BUN 19, creatinine 1.84, white count 19.7, hemoglobin 15.7, hematocrit 47.7, platelets 124, venous lactic acid 6.2, urinalysis with 30 protein, specific gravity 1.045, urinary sodium less than 10.  Head CT negative for acute changes.  Chest radiograph with left lower lobe atelectasis.  CT of the abdomen with peripheral wedge-shaped defect in the spleen consistent with splenic infarct.  Patient was admitted to the hospital with the working diagnosis of sepsis due to right foot cellulitis, complicated by acute kidney injury.   Assessment & Plan:   Principal Problem:   Severe sepsis ([place]) Active Problems:   Insulin-requiring or dependent type II diabetes mellitus ([place])   CAD (coronary artery disease)   Thrombocytopenia (HCC)   Splenic infarct   Renal insufficiency   Right foot infection   Sepsis ([place])   Prosthetic valve endocarditis (HCC)   Staphylococcus aureus bacteremia with sepsis (HCC)   Staphylococcal arthritis of right wrist (HCC)   Acute  hematogenous osteomyelitis, right ankle and foot ([place])  1. Sepsis due to right diabetic foot osteomyelitis/ MSSA bacteremia.  On IV antibiotic cefazolin, positive splenic infarcts in the setting of bioprosthetic aortic, plan for TEE while in the OR today. For transtibial amputation today.   2. Pre-renal AKI. Continue to improve renal function, today serum cr at 1,09, will continue to follow renal panel in am, avoid hypotension or nephrotoxic medications.  3. T2DM with hypoglycemia. Tolerating well full liquid diet. Fasting glucose 24 with capillary glucose in the low 90's, will continue to hold on insulin therapy for now.    4. Coronary artery disease. On aspirin and simvastatin.   5. Dysphagia. Continue sucralfate and pantoprazole.    DVT prophylaxis: heparin   Code Status:  full Family Communication:  no family at the bedside  Disposition Plan/ discharge barriers: pending surgical intervention    Consultants:   ID  Orthopedics  Procedures:     Antimicrobials:   IV Cefazolin   Subjective: Patient able to tolerate better full liquid diet, this am no nausea or vomiting. Continue to have generalized malaise.   Objective: Vitals:   01/16/18 0502 01/16/18 1435 01/16/18 2013 01/17/18 0355  BP: 108/67 132/81 126/68 130/75  Pulse: (!) 102 (!) 108 (!) 105 (!) 102  Resp: 18 16 16 16   Temp: 98.2 F (36.8 C) 98.4 F (36.9 C) 98.8 F (37.1 C) 98.7 F (37.1 C)  TempSrc: Oral Oral Oral Oral  SpO2: 95% 97% 96% 98%  Weight:      Height:        Intake/Output Summary (Last 24 hours) at 01/17/2018 0834 Last data filed at 01/16/2018 2100 Gross per 24 hour  Intake 240 ml  Output 900 ml  Net -660 ml   Filed Weights   01/14/18 2012 01/15/18 0500 01/16/18 0500  Weight: 99.8 kg 96.1 kg 95.2 kg    Examination:   General: Not in pain or dyspnea, deconditioned  Neurology: Awake and alert, non focal  E ENT: mild pallor, no icterus, oral mucosa moist Cardiovascular:  No JVD. S1-S2 present, rhythmic, no gallops, rubs, or murmurs. No lower extremity edema. Pulmonary: positive breath sounds bilaterally, no wheezing, rhonchi or rales. Gastrointestinal. Abdomen protuberant with no organomegaly, non tender, no rebound or guarding Skin. Right foot ulcerated wound in the medial aspect. 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      Data Reviewed: I have personally reviewed following labs and imaging studies  CBC: Recent Labs  Lab 01/14/18 1926 01/15/18 0246 01/16/18 0327 01/17/18 0355  WBC 19.7* 23.1* 15.4* 12.7*  NEUTROABS 17.9* 21.1*  --  10.5*  HGB 15.7 12.1* 12.5* 12.6*  HCT 47.7 37.8* 37.8* 38.0*  MCV 88.0 89.6 87.1 87.0  PLT 124* 84* 80* 86*   Basic Metabolic Panel: Recent Labs  Lab 01/14/18 1926 01/15/18 0246 01/15/18 0602 01/16/18 0327 01/17/18 0355  NA 135 136 138 138 139  K 3.8 4.4 4.7 3.6 4.7  CL 100 111 109 112* 110  CO2 19* 15* 15* 19* 22  GLUCOSE 182* 195* 210* 90 96  BUN 19 24* 26* 34* 24*  CREATININE 1.84* 1.78* 1.75* 1.27* 1.09  CALCIUM 9.0 7.1* 7.2* 7.4* 7.9*  MG  --   --   --  2.0  --   PHOS  --   --   --  1.9*  --    GFR: Estimated Creatinine Clearance: 82.4 mL/min (by C-G formula based on SCr of 1.09 mg/dL). Liver Function Tests: Recent Labs  Lab 01/14/18 1926 01/15/18 0246  AST 36 32  ALT 20 17  ALKPHOS 78 47  BILITOT 1.4* 0.8  PROT 7.1 5.0*  ALBUMIN 3.8 2.6*   Recent Labs  Lab 01/14/18 1926  LIPASE 23   Recent Labs  Lab 01/14/18 1927  AMMONIA 36*   Coagulation Profile: Recent Labs  Lab 01/14/18 1926 01/15/18 0246  INR 1.43 1.72   Cardiac Enzymes: No results for input(s): CKTOTAL, CKMB, CKMBINDEX, TROPONINI in the last 168 hours. BNP (last 3 results) No results for input(s): PROBNP in the last 8760 hours. HbA1C: Recent Labs    01/16/18 0327  HGBA1C 8.5*   CBG: Recent Labs  Lab 01/16/18 1150 01/16/18 1600 01/16/18 1935 01/16/18 2107 01/17/18 0714  GLUCAP 72  66* 91 85 107*   Lipid Profile: No results for input(s): CHOL, HDL, LDLCALC, TRIG, CHOLHDL, LDLDIRECT in the last 72 hours. Thyroid Function Tests: No results for input(s): TSH, T4TOTAL, FREET4, T3FREE, THYROIDAB in the last 72 hours. Anemia Panel: No results for input(s): VITAMINB12, FOLATE, FERRITIN, TIBC, IRON, RETICCTPCT in the last 72 hours.    Radiology Studies: I have reviewed all of the imaging during this hospital visit personally     Scheduled Meds: . aspirin  325 mg Oral Daily  . calcium-vitamin D  1 tablet Oral BID  . heparin  5,000 Units Subcutaneous Q8H  . Influenza vac split quadrivalent PF  0.5 mL Intramuscular Tomorrow-1000  . insulin aspart  0-9 Units Subcutaneous TID WC  . latanoprost  1 drop Both Eyes QHS  . mouth rinse  15 mL Mouth Rinse BID  . multivitamin with minerals  1 tablet Oral Daily  . pantoprazole  40 mg Oral Daily  .  simvastatin  20 mg Oral q1800  . sodium chloride flush  3 mL Intravenous Q12H  . sucralfate  1 g Oral TID WC & HS   Continuous Infusions: . sodium chloride 50 mL/hr at 01/16/18 1750  .  ceFAZolin (ANCEF) IV 2 g (01/17/18 0540)     LOS: 2 days        Moe Graca Gerome Apley, MD [facility] Pager [phone]

## 2018-01-17 NOTE — Anesthesia Procedure Notes (Signed)
Procedure Name: LMA Insertion Date/Time: 01/17/2018 12:31 PM Performed by: Gwyndolyn Saxon, CRNA Pre-anesthesia Checklist: Patient identified, Emergency Drugs available, Suction available and Patient being monitored Patient Re-evaluated:Patient Re-evaluated prior to induction Oxygen Delivery Method: Circle system utilized Preoxygenation: Pre-oxygenation with 100% oxygen Induction Type: IV induction Ventilation: Mask ventilation without difficulty LMA: LMA inserted LMA Size: 4.0 Number of attempts: 1 Airway Equipment and Method: Patient positioned with wedge pillow Placement Confirmation: positive ETCO2,  CO2 detector and breath sounds checked- equal and bilateral Tube secured with: Tape Dental Injury: Teeth and Oropharynx as per pre-operative assessment

## 2018-01-18 ENCOUNTER — Encounter (HOSPITAL_COMMUNITY): Payer: Self-pay | Admitting: Orthopedic Surgery

## 2018-01-18 DIAGNOSIS — Z952 Presence of prosthetic heart valve: Secondary | ICD-10-CM

## 2018-01-18 LAB — GLUCOSE, CAPILLARY
GLUCOSE-CAPILLARY: 143 mg/dL — AB (ref 70–99)
GLUCOSE-CAPILLARY: 186 mg/dL — AB (ref 70–99)
Glucose-Capillary: 115 mg/dL — ABNORMAL HIGH (ref 70–99)
Glucose-Capillary: 147 mg/dL — ABNORMAL HIGH (ref 70–99)

## 2018-01-18 NOTE — Progress Notes (Signed)
Inpatient Rehabilitation  Per PT request, patient was screened by Gunnar Fusi for appropriateness for an Inpatient Acute Rehab consult.  At this time we are planning to follow along for transfer assist prior to requesting an Inpatient Rehab consult.  Please also consider ordering an OT evaluation.    Carmelia Roller., CCC/SLP Admission Coordinator  [place]  Cell [phone]

## 2018-01-18 NOTE — Progress Notes (Signed)
Pharmacy Antibiotic Note  [name] is a 62 y.o. male admitted on 01/14/2018 with MSSA bacteremia. Pharmacy has been consulted for cefazolin dosing. WBC down to 12.7. Scr improved to 1.09 (BL ~1.2), estimated CrCl ~82 mL/min.  Plan: Continue cefazolin 2g IV q8h F/u clinical status, repeat Cx, ECHO, renal function  Height: 5\' 10"  (177.8 cm) Weight: 209 lb 14.1 oz (95.2 kg) IBW/kg (Calculated) : 73  Temp (24hrs), Avg:98.9 F (37.2 C), Min:98.1 F (36.7 C), Max:99.5 F (37.5 C)  Recent Labs  Lab 01/14/18 1926 01/14/18 1933 01/14/18 2027 01/15/18 0245 01/15/18 0246 01/15/18 0602 01/16/18 0327 01/17/18 0355  WBC 19.7*  --   --   --  23.1*  --  15.4* 12.7*  CREATININE 1.84*  --   --   --  1.78* 1.75* 1.27* 1.09  LATICACIDVEN  --  6.24* 5.45* 3.0*  --  2.4*  --   --     Estimated Creatinine Clearance: 82.4 mL/min (by C-G formula based on SCr of 1.09 mg/dL).    Allergies  Allergen Reactions  . Ace Inhibitors Cough   Antimicrobials this admission: CTX 9/10 x1 Vanc 9/10 >> 9/11 Cefepime 9/10 >> 9/11 Flagyl 9/10 >> 9/11 Cefazolin 9/11 >>  Microbiology results: 9/10 BCx: 4/4 GPC (BCID MSSA) 9/11 BCx: pending  9/11 UCx: pending  Thank you for allowing pharmacy to be a part of this patient's care.  Sloan Leiter, PharmD, BCPS, BCCCP Clinical Pharmacist Clinical phone 01/18/2018 until 3:30PM- #[phone] Please refer to [facility] for [place] numbers 01/18/2018 2:21 PM

## 2018-01-18 NOTE — Evaluation (Signed)
Physical Therapy Evaluation Patient Details Name: [name] MRN: [id number] DOB: 06-Apr-1956 Today's Date: 01/18/2018   History of Present Illness  Pt is a 62 y/o male s/p R transtibial amputation. PMH including but not limited to L transtibial amputation in 2016, PVD, CAD, DM and HTN.    Clinical Impression  Pt presented supine in bed with HOB elevated, awake and willing to participate in therapy session. Prior to admission, pt reported that he was modified independent with all functional mobility with use of his L LE prosthesis all the time and a single point cane PRN. Pt currently able to perform bed mobility with min guard to min A. Discussed an A-P transfer with pt; however, pt requesting to defer to tomorrow. PT will continue to follow pt acutely to progress mobility as tolerated. Pt will require post-acute rehab prior to returning home with family support.     Follow Up Recommendations CIR    Equipment Recommendations  None recommended by PT    Recommendations for Other Services Rehab consult     Precautions / Restrictions Precautions Precautions: Fall Precaution Comments: wound VAC Required Braces or Orthoses: Other Brace/Splint Other Brace/Splint: limb protector Restrictions Weight Bearing Restrictions: Yes RLE Weight Bearing: Non weight bearing      Mobility  Bed Mobility Overal bed mobility: Needs Assistance Bed Mobility: Supine to Sit;Sit to Supine     Supine to sit: Min assist Sit to supine: Supervision   General bed mobility comments: increased time and effort, use of bilateral UEs on bed rails, min A for trunk elevation to achieve long sitting in bed  Transfers                 General transfer comment: discussed A-P transfer with pt; pt deferring this session, would like to attempt tomorrow  Ambulation/Gait                Stairs            Wheelchair Mobility    Modified Rankin (Stroke Patients Only)       Balance  Overall balance assessment: Needs assistance Sitting-balance support: Bilateral upper extremity supported;Single extremity supported Sitting balance-Leahy Scale: Poor                                       Pertinent Vitals/Pain Pain Assessment: 0-10 Pain Score: 2  Pain Location: R residual limb Pain Descriptors / Indicators: Sore;Stabbing Pain Intervention(s): Monitored during session    Home Living Family/patient expects to be discharged to:: Private residence Living Arrangements: Alone Available Help at Discharge: Family;Available PRN/intermittently Type of Home: Mobile home Home Access: Ramped entrance     Home Layout: One level Home Equipment: Shower seat;Hand held Tourist information centre manager - 2 wheels;Wheelchair - manual;Cane - single point      Prior Function Level of Independence: Independent with assistive device(s)         Comments: has a L LE prosthesis; used a cane PRN on uneven surfaces     Hand Dominance   Dominant Hand: Right    Extremity/Trunk Assessment   Upper Extremity Assessment Upper Extremity Assessment: Overall WFL for tasks assessed    Lower Extremity Assessment Lower Extremity Assessment: Generalized weakness;RLE deficits/detail RLE Deficits / Details: pt with decreased strength and ROM limitations secondary to post-op pain and weakness. Pt with good knee extension (to neutral), limited knee flexion (~45 degrees) with pain RLE: Unable to  fully assess due to pain    Cervical / Trunk Assessment Cervical / Trunk Assessment: Normal  Communication   Communication: No difficulties  Cognition Arousal/Alertness: Awake/alert Behavior During Therapy: WFL for tasks assessed/performed Overall Cognitive Status: Within Functional Limits for tasks assessed                                        General Comments      Exercises Other Exercises Other Exercises: pt performed resisted R SLR, hip abduction, hip adduction, and  hip extension; AROM knee flexion/extension   Assessment/Plan    PT Assessment Patient needs continued PT services  PT Problem List Decreased strength;Decreased range of motion;Decreased activity tolerance;Decreased balance;Decreased mobility;Decreased coordination;Decreased safety awareness;Decreased knowledge of use of DME;Decreased knowledge of precautions;Pain       PT Treatment Interventions DME instruction;Gait training;Stair training;Functional mobility training;Therapeutic activities;Therapeutic exercise;Balance training;Neuromuscular re-education;Patient/family education    PT Goals (Current goals can be found in the Care Plan section)  Acute Rehab PT Goals Patient Stated Goal: to get stronger PT Goal Formulation: With patient Time For Goal Achievement: 02/01/18 Potential to Achieve Goals: Good    Frequency Min 5X/week   Barriers to discharge        Co-evaluation               AM-PAC PT "6 Clicks" Daily Activity  Outcome Measure Difficulty turning over in bed (including adjusting bedclothes, sheets and blankets)?: Unable Difficulty moving from lying on back to sitting on the side of the bed? : Unable Difficulty sitting down on and standing up from a chair with arms (e.g., wheelchair, bedside commode, etc,.)?: Unable Help needed moving to and from a bed to chair (including a wheelchair)?: A Lot Help needed walking in hospital room?: Total Help needed climbing 3-5 steps with a railing? : Total 6 Click Score: 7    End of Session   Activity Tolerance: Patient limited by pain Patient left: in bed;with call bell/phone within reach;with bed alarm set Nurse Communication: Mobility status PT Visit Diagnosis: Other abnormalities of gait and mobility (R26.89);Pain Pain - Right/Left: Right Pain - part of body: Leg    Time: 1655-3748 PT Time Calculation (min) (ACUTE ONLY): 16 min   Charges:   PT Evaluation $PT Eval Moderate Complexity: 1 Mod          Sherie Don, PT, DPT  Acute Rehabilitation Services Pager [phone] Office [place] 01/18/2018, 10:50 AM

## 2018-01-18 NOTE — Progress Notes (Signed)
   Subjective:  Patient reports pain as mild.  No events.  Objective:   VITALS:   Vitals:   01/17/18 2130 01/18/18 0446 01/18/18 0749 01/18/18 0838  BP: 140/82 139/86 137/85 (!) 153/85  Pulse: (!) 102 (!) 102 99 (!) 106  Resp: 18 18 20 18   Temp: 99.1 F (37.3 C)  98.9 F (37.2 C) 99.5 F (37.5 C)  TempSrc: Oral  Oral Oral  SpO2: 92% 93% 96% 96%  Weight:      Height:        Dressing c/d/i Wound vac intact   Lab Results  Component Value Date   WBC 12.7 (H) 01/17/2018   HGB 12.6 (L) 01/17/2018   HCT 38.0 (L) 01/17/2018   MCV 87.0 01/17/2018   PLT 86 (L) 01/17/2018     Assessment/Plan:  1 Day Post-Op   - Expected postop acute blood loss anemia - will monitor for symptoms - Up with PT/OT - DVT ppx - SCDs, ambulation, aspirin - NWB operative extremity - Pain control - Discharge planning - to SNF early next week  Eduard Roux 01/18/2018, 8:44 AM [phone]

## 2018-01-18 NOTE — Plan of Care (Signed)

## 2018-01-18 NOTE — Progress Notes (Signed)
PROGRESS NOTE    [name]  BWI:[id number] DOB: 09-Jan-1956 DOA: 01/14/2018 PCP: [name], MD    Brief Narrative:  62 year old male who presented with malaise, vomiting, lower abdominal pain and right foot woundbleeding. He does have thesignificant past medical history for coronary artery disease,bioprostheticaortic valve replacement, type 2 diabetes mellitus, peripheral vascular disease status post left BKA and right TMA. He had generalized malaise, associated with nausea, vomiting and abdominal pain, positive bleeding on his right foot wound ulcer. On his initial physical examination he was tachypneic, tachycardic 120 bpm, hypotensive 90/60. Ill looking appearing, dry mucous membranes, lungs clear to auscultation, heart S1-S2 present, tachycardic, abdomen with generalized tenderness, right mid foot ulcerated wound, with surrounding edema and erythema. Sodium 135, potassium 3.8, chloride 100, bicarb 19, glucose 182, BUN 19, creatinine 1.84, white count 19.7, hemoglobin 15.7, hematocrit 47.7, platelets 124, venous lactic acid 6.2,urinalysis with 30 protein, specific gravity 1.045, urinary sodium less than 10.Head CT negative for acute changes. Chest radiograph with left lower lobe atelectasis.CT of the abdomen with peripheral wedge-shaped defect in the spleen consistent with splenic infarct.  Patient was admitted to the hospital withtheworking diagnosis of sepsis due to right foot cellulitis, complicated by acute kidney injury.   Assessment & Plan:   Principal Problem:   Severe sepsis ([place]) Active Problems:   Insulin-requiring or dependent type II diabetes mellitus (HCC)   CAD (coronary artery disease)   Thrombocytopenia (HCC)   S/P BKA (below knee amputation) unilateral, right (HCC)   Splenic infarct   Renal insufficiency   Right foot infection   Sepsis ([place])   Prosthetic valve endocarditis (HCC)   Staphylococcus aureus bacteremia with sepsis (HCC)  Staphylococcal arthritis of right wrist (HCC)   Acute hematogenous osteomyelitis, right ankle and foot ([place])   History of prosthetic mitral valve  1. Sepsis due to rightdiabeticfoot osteomyelitis/MSSA bacteremia. Patient is sp right transtibial amputation. Improved symptoms this am, improved po intake. Has bee afebrile, will continue antibiotic therapy with IV cefazolin, will need TEE to rule out endocarditis. Continue pain control.   2.Pre-renalAKI. Stable renal function and electrolytes, will continue to avoid hypotension or nephrotoxic medications.  3. T2DMwith hypoglycemia.Improved po intake, but continue high risk for hypoglycemia, insulin therapy has been discontinued, will continue glucose monitoring.   4. Coronary artery disease.No chest pain will continue aspirin and simvastatin.   5. Dysphagia. Improved po intake and dyspepsia, will continue sucralfate and pantoprazole.   DVT prophylaxis:heparin Code Status:full Family Communication: no family at the bedside  Disposition Plan/ discharge barriers:pending surgical intervention   Consultants:  ID  Orthopedics  Procedures:    Antimicrobials:  IV Cefazolin   Subjective: Patient is feeling better, diet has been advanced with good toleration, no nausea or vomiting, no chest pain or dyspnea. Had BKA on the right yesterday.   Objective: Vitals:   01/17/18 2130 01/18/18 0446 01/18/18 0749 01/18/18 0838  BP: 140/82 139/86 137/85 (!) 153/85  Pulse: (!) 102 (!) 102 99 (!) 106  Resp: 18 18 20 18   Temp: 99.1 F (37.3 C)  98.9 F (37.2 C) 99.5 F (37.5 C)  TempSrc: Oral  Oral Oral  SpO2: 92% 93% 96% 96%  Weight:      Height:        Intake/Output Summary (Last 24 hours) at 01/18/2018 1019 Last data filed at 01/18/2018 0800 Gross per 24 hour  Intake 2329.1 ml  Output 825 ml  Net 1504.1 ml   Filed Weights   01/14/18 2012 01/15/18 0500 01/16/18  0500  Weight: 99.8 kg 96.1 kg 95.2  kg    Examination:   General: deconditioned  Neurology: Awake and alert, non focal  E ENT: mild pallor, no icterus, oral mucosa moist Cardiovascular: No JVD. S1-S2 present, rhythmic, no gallops, rubs, or murmurs. No lower extremity edema. Pulmonary: vesicular breath sounds bilaterally, adequate air movement, no wheezing, rhonchi or rales. Gastrointestinal. Abdomen with no organomegaly, non tender, no rebound or guarding Skin. No rashes Musculoskeletal: bilateral bka.      Data Reviewed: I have personally reviewed following labs and imaging studies  CBC: Recent Labs  Lab 01/14/18 1926 01/15/18 0246 01/16/18 0327 01/17/18 0355  WBC 19.7* 23.1* 15.4* 12.7*  NEUTROABS 17.9* 21.1*  --  10.5*  HGB 15.7 12.1* 12.5* 12.6*  HCT 47.7 37.8* 37.8* 38.0*  MCV 88.0 89.6 87.1 87.0  PLT 124* 84* 80* 86*   Basic Metabolic Panel: Recent Labs  Lab 01/14/18 1926 01/15/18 0246 01/15/18 0602 01/16/18 0327 01/17/18 0355  NA 135 136 138 138 139  K 3.8 4.4 4.7 3.6 4.7  CL 100 111 109 112* 110  CO2 19* 15* 15* 19* 22  GLUCOSE 182* 195* 210* 90 96  BUN 19 24* 26* 34* 24*  CREATININE 1.84* 1.78* 1.75* 1.27* 1.09  CALCIUM 9.0 7.1* 7.2* 7.4* 7.9*  MG  --   --   --  2.0  --   PHOS  --   --   --  1.9*  --    GFR: Estimated Creatinine Clearance: 82.4 mL/min (by C-G formula based on SCr of 1.09 mg/dL). Liver Function Tests: Recent Labs  Lab 01/14/18 1926 01/15/18 0246  AST 36 32  ALT 20 17  ALKPHOS 78 47  BILITOT 1.4* 0.8  PROT 7.1 5.0*  ALBUMIN 3.8 2.6*   Recent Labs  Lab 01/14/18 1926  LIPASE 23   Recent Labs  Lab 01/14/18 1927  AMMONIA 36*   Coagulation Profile: Recent Labs  Lab 01/14/18 1926 01/15/18 0246  INR 1.43 1.72   Cardiac Enzymes: No results for input(s): CKTOTAL, CKMB, CKMBINDEX, TROPONINI in the last 168 hours. BNP (last 3 results) No results for input(s): PROBNP in the last 8760 hours. HbA1C: Recent Labs    01/16/18 0327  HGBA1C 8.5*    CBG: Recent Labs  Lab 01/17/18 1309 01/17/18 1605 01/17/18 1654 01/17/18 2126 01/18/18 0735  GLUCAP 93 125* 99 100* 115*   Lipid Profile: No results for input(s): CHOL, HDL, LDLCALC, TRIG, CHOLHDL, LDLDIRECT in the last 72 hours. Thyroid Function Tests: No results for input(s): TSH, T4TOTAL, FREET4, T3FREE, THYROIDAB in the last 72 hours. Anemia Panel: No results for input(s): VITAMINB12, FOLATE, FERRITIN, TIBC, IRON, RETICCTPCT in the last 72 hours.    Radiology Studies: I have reviewed all of the imaging during this hospital visit personally     Scheduled Meds: . aspirin  325 mg Oral Daily  . calcium-vitamin D  1 tablet Oral BID  . docusate sodium  100 mg Oral BID  . heparin  5,000 Units Subcutaneous Q8H  . Influenza vac split quadrivalent PF  0.5 mL Intramuscular Tomorrow-1000  . latanoprost  1 drop Both Eyes QHS  . multivitamin with minerals  1 tablet Oral Daily  . pantoprazole  40 mg Oral Daily  . simvastatin  20 mg Oral q1800  . sodium chloride flush  3 mL Intravenous Q12H  . sucralfate  1 g Oral TID WC & HS   Continuous Infusions: . sodium chloride 75 mL/hr at 01/18/18 0448  .  sodium chloride    .  ceFAZolin (ANCEF) IV 2 g (01/18/18 0536)  . lactated ringers 10 mL/hr at 01/17/18 1115  . methocarbamol (ROBAXIN) IV       LOS: 3 days        Yajaira Doffing Gerome Apley, MD [facility] Pager [phone]

## 2018-01-19 DIAGNOSIS — M00031 Staphylococcal arthritis, right wrist: Secondary | ICD-10-CM

## 2018-01-19 LAB — CBC WITH DIFFERENTIAL/PLATELET
Abs Immature Granulocytes: 0.1 10*3/uL (ref 0.0–0.1)
BASOS ABS: 0 10*3/uL (ref 0.0–0.1)
BASOS PCT: 0 %
Eosinophils Absolute: 0.1 10*3/uL (ref 0.0–0.7)
Eosinophils Relative: 0 %
HCT: 40.8 % (ref 39.0–52.0)
Hemoglobin: 13.2 g/dL (ref 13.0–17.0)
IMMATURE GRANULOCYTES: 1 %
Lymphocytes Relative: 10 %
Lymphs Abs: 1.4 10*3/uL (ref 0.7–4.0)
MCH: 28.1 pg (ref 26.0–34.0)
MCHC: 32.4 g/dL (ref 30.0–36.0)
MCV: 86.8 fL (ref 78.0–100.0)
MONO ABS: 1.3 10*3/uL — AB (ref 0.1–1.0)
Monocytes Relative: 10 %
NEUTROS ABS: 10.6 10*3/uL — AB (ref 1.7–7.7)
NEUTROS PCT: 79 %
PLATELETS: 112 10*3/uL — AB (ref 150–400)
RBC: 4.7 MIL/uL (ref 4.22–5.81)
RDW: 13.3 % (ref 11.5–15.5)
WBC: 13.5 10*3/uL — ABNORMAL HIGH (ref 4.0–10.5)

## 2018-01-19 LAB — GLUCOSE, CAPILLARY
GLUCOSE-CAPILLARY: 173 mg/dL — AB (ref 70–99)
Glucose-Capillary: 173 mg/dL — ABNORMAL HIGH (ref 70–99)
Glucose-Capillary: 181 mg/dL — ABNORMAL HIGH (ref 70–99)
Glucose-Capillary: 174 mg/dL — ABNORMAL HIGH (ref 70–99)

## 2018-01-19 MED ORDER — METOPROLOL TARTRATE 50 MG PO TABS
50.0000 mg | ORAL_TABLET | Freq: Two times a day (BID) | ORAL | Status: DC
  Administered 2018-01-19 – 2018-01-22 (×7): 50 mg via ORAL
  Filled 2018-01-19 (×7): qty 1

## 2018-01-19 NOTE — Progress Notes (Addendum)
PROGRESS NOTE    Alin Chavira  SJG:[id number] DOB: [birth year] DOA: 01/14/2018 PCP: [name], MD    Brief Narrative:  62 year old male who presented with malaise, vomiting, lower abdominal pain and right foot woundbleeding. He does have thesignificant past medical history for coronary artery disease,bioprostheticaortic valve replacement, type 2 diabetes mellitus, peripheral vascular disease status post left BKA and right TMA. He had generalized malaise, associated with nausea, vomiting and abdominal pain, positive bleeding on his right foot wound ulcer. On his initial physical examination he was tachypneic, tachycardic 120 bpm, hypotensive 90/60. Ill looking appearing, dry mucous membranes, lungs clear to auscultation, heart S1-S2 present, tachycardic, abdomen with generalized tenderness, right mid foot ulcerated wound, with surrounding edema and erythema. Sodium 135, potassium 3.8, chloride 100, bicarb 19, glucose 182, BUN 19, creatinine 1.84, white count 19.7, hemoglobin 15.7, hematocrit 47.7, platelets 124, venous lactic acid 6.2,urinalysis with 30 protein, specific gravity 1.045, urinary sodium less than 10.Head CT negative for acute changes. Chest radiograph with left lower lobe atelectasis.CT of the abdomen with peripheral wedge-shaped defect in the spleen consistent with splenic infarct.  Patient was admitted to the hospital withtheworking diagnosis of sepsis due to right foot cellulitis, complicated by acute kidney injury.   Assessment & Plan:   Principal Problem:   Severe sepsis ([place]) Active Problems:   Insulin-requiring or dependent type II diabetes mellitus (HCC)   CAD (coronary artery disease)   Thrombocytopenia (HCC)   S/P BKA (below knee amputation) unilateral, right (HCC)   Splenic infarct   Renal insufficiency   Right foot infection   Sepsis ([place])   Prosthetic valve endocarditis (HCC)   Staphylococcus aureus bacteremia with sepsis (HCC)  Staphylococcal arthritis of right wrist (HCC)   Acute hematogenous osteomyelitis, right ankle and foot ([place])   History of prosthetic mitral valve  1. Sepsis due to rightdiabeticfoot osteomyelitis/MSSA bacteremia (in the setting on bioprosthetic aortic valve and splenic infarcts). sp right transtibial amputation. Antibiotic therapy with IV cefazolin, cardiology contacted, for TEE. If negative for vegetation will need only 2 weeks of antibiotic therapy.   2.Pre-renalAKI.Resolved, continue to encourage po intake, patient is off IV fluids.   3. T2DMwith hypoglycemia.capillary glucose has remained stable with valuses between 143 and 186 mg/dl. Continue to encourage po intake.   4. Coronary artery disease.On aspirin and simvastatin. No chest pain.   5. Dysphagia. Patient tolerating well antiacid therapy with pantoprazole and sucralfate.   6. HTN. Systolic blood pressure 654 to 150 with HR in the 104 to 105 bmp range, will continue to hold on losartan but will resume metoprolol.   DVT prophylaxis:heparin Code Status:full Family Communication:no family at the bedside  Disposition Plan/ discharge barriers:pending TEE and inpatient rehab placement.  Consultants:  ID  Orthopedics  Procedures:    Antimicrobials:  IV Cefazolin  Subjective: Occasional nausea, no vomiting, or abdominal pain, no chest pain or dyspnea.   Objective: Vitals:   01/18/18 1622 01/18/18 2017 01/19/18 0002 01/19/18 0328  BP: (!) 144/72 138/79 130/78 (!) 158/90  Pulse: (!) 105 (!) 104 88 (!) 106  Resp: (!) 21 18 16 20   Temp: 98.9 F (37.2 C) 98.7 F (37.1 C) 98.9 F (37.2 C) 98.1 F (36.7 C)  TempSrc: Oral Oral Oral Oral  SpO2: 96% 94% 98% 96%  Weight:      Height:        Intake/Output Summary (Last 24 hours) at 01/19/2018 0948 Last data filed at 01/19/2018 0648 Gross per 24 hour  Intake 1398.79 ml  Output  3100 ml  Net -1701.21 ml   Filed Weights   01/14/18 2012  01/15/18 0500 01/16/18 0500  Weight: 99.8 kg 96.1 kg 95.2 kg    Examination:   General: deconditioned  Neurology: Awake and alert, non focal  E ENT: mild pallor, no icterus, oral mucosa moist Cardiovascular: No JVD. S1-S2 present, rhythmic, no gallops, rubs, or murmurs. No lower extremity edema. Pulmonary: positive breath sounds bilaterally, adequate air movement, no wheezing, rhonchi or rales. Gastrointestinal. Abdomen protuberant with no organomegaly, non tender, no rebound or guarding Skin. No rashes Musculoskeletal: no joint deformities     Data Reviewed: I have personally reviewed following labs and imaging studies  CBC: Recent Labs  Lab 01/14/18 1926 01/15/18 0246 01/16/18 0327 01/17/18 0355 01/19/18 0315  WBC 19.7* 23.1* 15.4* 12.7* 13.5*  NEUTROABS 17.9* 21.1*  --  10.5* 10.6*  HGB 15.7 12.1* 12.5* 12.6* 13.2  HCT 47.7 37.8* 37.8* 38.0* 40.8  MCV 88.0 89.6 87.1 87.0 86.8  PLT 124* 84* 80* 86* 161*   Basic Metabolic Panel: Recent Labs  Lab 01/14/18 1926 01/15/18 0246 01/15/18 0602 01/16/18 0327 01/17/18 0355  NA 135 136 138 138 139  K 3.8 4.4 4.7 3.6 4.7  CL 100 111 109 112* 110  CO2 19* 15* 15* 19* 22  GLUCOSE 182* 195* 210* 90 96  BUN 19 24* 26* 34* 24*  CREATININE 1.84* 1.78* 1.75* 1.27* 1.09  CALCIUM 9.0 7.1* 7.2* 7.4* 7.9*  MG  --   --   --  2.0  --   PHOS  --   --   --  1.9*  --    GFR: Estimated Creatinine Clearance: 82.4 mL/min (by C-G formula based on SCr of 1.09 mg/dL). Liver Function Tests: Recent Labs  Lab 01/14/18 1926 01/15/18 0246  AST 36 32  ALT 20 17  ALKPHOS 78 47  BILITOT 1.4* 0.8  PROT 7.1 5.0*  ALBUMIN 3.8 2.6*   Recent Labs  Lab 01/14/18 1926  LIPASE 23   Recent Labs  Lab 01/14/18 1927  AMMONIA 36*   Coagulation Profile: Recent Labs  Lab 01/14/18 1926 01/15/18 0246  INR 1.43 1.72   Cardiac Enzymes: No results for input(s): CKTOTAL, CKMB, CKMBINDEX, TROPONINI in the last 168 hours. BNP (last 3  results) No results for input(s): PROBNP in the last 8760 hours. HbA1C: No results for input(s): HGBA1C in the last 72 hours. CBG: Recent Labs  Lab 01/18/18 0735 01/18/18 1139 01/18/18 1620 01/18/18 2140 01/19/18 0646  GLUCAP 115* 143* 186* 147* 173*   Lipid Profile: No results for input(s): CHOL, HDL, LDLCALC, TRIG, CHOLHDL, LDLDIRECT in the last 72 hours. Thyroid Function Tests: No results for input(s): TSH, T4TOTAL, FREET4, T3FREE, THYROIDAB in the last 72 hours. Anemia Panel: No results for input(s): VITAMINB12, FOLATE, FERRITIN, TIBC, IRON, RETICCTPCT in the last 72 hours.    Radiology Studies: I have reviewed all of the imaging during this hospital visit personally     Scheduled Meds: . aspirin  325 mg Oral Daily  . calcium-vitamin D  1 tablet Oral BID  . heparin  5,000 Units Subcutaneous Q8H  . Influenza vac split quadrivalent PF  0.5 mL Intramuscular Tomorrow-1000  . latanoprost  1 drop Both Eyes QHS  . multivitamin with minerals  1 tablet Oral Daily  . pantoprazole  40 mg Oral Daily  . simvastatin  20 mg Oral q1800  . sodium chloride flush  3 mL Intravenous Q12H  . sucralfate  1 g Oral TID WC &  HS   Continuous Infusions: . sodium chloride 10 mL/hr (01/18/18 1812)  .  ceFAZolin (ANCEF) IV 2 g (01/19/18 0548)  . lactated ringers 10 mL/hr at 01/17/18 1115  . methocarbamol (ROBAXIN) IV       LOS: 4 days        Leighton Luster Gerome Apley, MD [facility] Pager [phone]

## 2018-01-19 NOTE — Progress Notes (Signed)
Physical Therapy Treatment Patient Details Name: [name] MRN: [id number] DOB: Oct 14, 1955 Today's Date: 01/19/2018    History of Present Illness Pt is a 62 y/o male s/p R transtibial amputation. PMH including but not limited to L transtibial amputation in 2016, PVD, CAD, DM and HTN.    PT Comments    Pt received in bed agreeable to participation in therapy. Min assist required for bed mobility and +2 mod assist posterior transfer into recliner. RLE exercises performed in recliner.   Follow Up Recommendations  CIR     Equipment Recommendations  None recommended by PT    Recommendations for Other Services       Precautions / Restrictions Precautions Precautions: Fall;Other (comment) Precaution Comments: wound VAC Required Braces or Orthoses: Other Brace/Splint Other Brace/Splint: limb protector Restrictions Weight Bearing Restrictions: Yes RLE Weight Bearing: Non weight bearing    Mobility  Bed Mobility Overal bed mobility: Needs Assistance Bed Mobility: Supine to Sit     Supine to sit: Min assist;HOB elevated     General bed mobility comments: min assist for trunk elevation into long sitting  Transfers Overall transfer level: Needs assistance Equipment used: None Transfers: Comptroller transfers: +2 physical assistance;Mod assist   General transfer comment: assist with bed pad for posterior scoot to EOB and posterior scoot into recliner. Pt with good UE strength and trunk control.  Ambulation/Gait             General Gait Details: unable   Stairs             Wheelchair Mobility    Modified Rankin (Stroke Patients Only)       Balance Overall balance assessment: Needs assistance Sitting-balance support: Feet unsupported Sitting balance-Leahy Scale: Fair                                      Cognition Arousal/Alertness: Awake/alert Behavior During Therapy: WFL for tasks  assessed/performed Overall Cognitive Status: Within Functional Limits for tasks assessed                                        Exercises Amputee Exercises Quad Sets: AROM;Right;10 reps Hip ABduction/ADduction: AROM;Right;10 reps Knee Flexion: AROM;Right;10 reps Straight Leg Raises: AROM;Right;10 reps    General Comments        Pertinent Vitals/Pain Pain Assessment: 0-10 Pain Score: 5  Pain Location: R residual limb Pain Descriptors / Indicators: Grimacing;Sore;Guarding Pain Intervention(s): Monitored during session;Repositioned    Home Living                      Prior Function            PT Goals (current goals can now be found in the care plan section) Acute Rehab PT Goals Patient Stated Goal: to get stronger PT Goal Formulation: With patient Time For Goal Achievement: 02/01/18 Potential to Achieve Goals: Good Progress towards PT goals: Progressing toward goals    Frequency    Min 5X/week      PT Plan Current plan remains appropriate    Co-evaluation              AM-PAC PT "6 Clicks" Daily Activity  Outcome Measure  Difficulty turning over in bed (including adjusting bedclothes, sheets and blankets)?:  A Lot Difficulty moving from lying on back to sitting on the side of the bed? : A Lot Difficulty sitting down on and standing up from a chair with arms (e.g., wheelchair, bedside commode, etc,.)?: Unable Help needed moving to and from a bed to chair (including a wheelchair)?: A Lot Help needed walking in hospital room?: Total Help needed climbing 3-5 steps with a railing? : Total 6 Click Score: 9    End of Session   Activity Tolerance: Patient tolerated treatment well Patient left: in chair;with call bell/phone within reach Nurse Communication: Mobility status PT Visit Diagnosis: Other abnormalities of gait and mobility (R26.89);Pain Pain - Right/Left: Right Pain - part of body: Leg     Time: 1042-1100 PT Time  Calculation (min) (ACUTE ONLY): 18 min  Charges:  $Therapeutic Activity: 8-22 mins                     Lorrin Goodell, PT  Office # [phone] Pager [phone]    [name] 01/19/2018, 11:23 AM

## 2018-01-20 DIAGNOSIS — E119 Type 2 diabetes mellitus without complications: Secondary | ICD-10-CM

## 2018-01-20 DIAGNOSIS — A4101 Sepsis due to Methicillin susceptible Staphylococcus aureus: Principal | ICD-10-CM

## 2018-01-20 DIAGNOSIS — Z89511 Acquired absence of right leg below knee: Secondary | ICD-10-CM

## 2018-01-20 DIAGNOSIS — D72829 Elevated white blood cell count, unspecified: Secondary | ICD-10-CM

## 2018-01-20 DIAGNOSIS — Z89512 Acquired absence of left leg below knee: Secondary | ICD-10-CM

## 2018-01-20 DIAGNOSIS — I739 Peripheral vascular disease, unspecified: Secondary | ICD-10-CM

## 2018-01-20 DIAGNOSIS — I2510 Atherosclerotic heart disease of native coronary artery without angina pectoris: Secondary | ICD-10-CM

## 2018-01-20 DIAGNOSIS — G8918 Other acute postprocedural pain: Secondary | ICD-10-CM

## 2018-01-20 DIAGNOSIS — E669 Obesity, unspecified: Secondary | ICD-10-CM

## 2018-01-20 DIAGNOSIS — Z794 Long term (current) use of insulin: Secondary | ICD-10-CM

## 2018-01-20 DIAGNOSIS — E1169 Type 2 diabetes mellitus with other specified complication: Secondary | ICD-10-CM

## 2018-01-20 DIAGNOSIS — I10 Essential (primary) hypertension: Secondary | ICD-10-CM

## 2018-01-20 DIAGNOSIS — Z952 Presence of prosthetic heart valve: Secondary | ICD-10-CM

## 2018-01-20 DIAGNOSIS — Z978 Presence of other specified devices: Secondary | ICD-10-CM

## 2018-01-20 LAB — GLUCOSE, CAPILLARY
Glucose-Capillary: 170 mg/dL — ABNORMAL HIGH (ref 70–99)
Glucose-Capillary: 201 mg/dL — ABNORMAL HIGH (ref 70–99)
Glucose-Capillary: 234 mg/dL — ABNORMAL HIGH (ref 70–99)
Glucose-Capillary: 273 mg/dL — ABNORMAL HIGH (ref 70–99)

## 2018-01-20 LAB — CULTURE, BLOOD (ROUTINE X 2)
Culture: NO GROWTH
Culture: NO GROWTH

## 2018-01-20 NOTE — H&P (View-Only) (Signed)
[facility] for Infectious Disease  Date of Admission:  01/14/2018     Total days of antibiotics: 7 (6 ancef)         ASSESSMENT/PLAN  [name] has complicated Staphylococcus aureus bacteremia with concern for endocarditis given his splenic infarcts and POD #3 from right below knee amputation. Fever curve and white blood cell count are down trending with repeat cultures finalized and clear. He is on Day 7 of antimicrobial therapy with Day 6 of Cefazolin. Awaiting TEE with scheduling in process to determine length of treatment.   1. Continue current dose of Cefazolin. 2. PICC pending TEE results. 3. Will d/i home care.    Principal Problem:   Staphylococcus aureus bacteremia Active Problems:   Insulin-requiring or dependent type II diabetes mellitus (HCC)   CAD (coronary artery disease)   Thrombocytopenia (HCC)   S/P BKA (below knee amputation) unilateral, right (HCC)   Splenic infarct   Renal insufficiency   Severe sepsis (HCC)   Right foot infection   Sepsis ([place])   Prosthetic valve endocarditis (HCC)   Staphylococcal arthritis of right wrist (HCC)   Acute hematogenous osteomyelitis, right ankle and foot ([place])   History of prosthetic mitral valve   S/P bilateral BKA (below knee amputation) (HCC)   Post-operative pain   Benign essential HTN   Diabetes mellitus type 2 in obese ([place])   PVD (peripheral vascular disease) ([place])   . aspirin  325 mg Oral Daily  . calcium-vitamin D  1 tablet Oral BID  . heparin  5,000 Units Subcutaneous Q8H  . Influenza vac split quadrivalent PF  0.5 mL Intramuscular Tomorrow-1000  . latanoprost  1 drop Both Eyes QHS  . metoprolol tartrate  50 mg Oral BID  . multivitamin with minerals  1 tablet Oral Daily  . pantoprazole  40 mg Oral Daily  . simvastatin  20 mg Oral q1800  . sodium chloride flush  3 mL Intravenous Q12H  . sucralfate  1 g Oral TID WC & HS    SUBJECTIVE:  Afebrile overnight.  No complaints   Allergies    Allergen Reactions  . Ace Inhibitors Cough     Review of Systems: ROS negative   OBJECTIVE: Vitals:   01/19/18 1601 01/19/18 2059 01/20/18 0522 01/20/18 0828  BP: 139/77 140/82 (!) 148/80 (!) 143/80  Pulse: (!) 101 89 93 96  Resp:  18 18   Temp:  98.1 F (36.7 C) 97.6 F (36.4 C)   TempSrc:  Oral Oral   SpO2:  95% 96%   Weight:      Height:       Body mass index is 30.11 kg/m.  Physical Exam  Constitutional: He appears well-developed and well-nourished.  Cardiovascular: Normal rate, regular rhythm and normal heart sounds.  Pulmonary/Chest: Effort normal and breath sounds normal.  Abdominal: Soft. Bowel sounds are normal.  Musculoskeletal:       Legs:   Lab Results Lab Results  Component Value Date   WBC 13.5 (H) 01/19/2018   HGB 13.2 01/19/2018   HCT 40.8 01/19/2018   MCV 86.8 01/19/2018   PLT 112 (L) 01/19/2018    Lab Results  Component Value Date   CREATININE 1.09 01/17/2018   BUN 24 (H) 01/17/2018   NA 139 01/17/2018   K 4.7 01/17/2018   CL 110 01/17/2018   CO2 22 01/17/2018    Lab Results  Component Value Date   ALT 17 01/15/2018   AST 32 01/15/2018  ALKPHOS 47 01/15/2018   BILITOT 0.8 01/15/2018     Microbiology: Recent Results (from the past 240 hour(s))  Blood Culture (routine x 2)     Status: Abnormal   Collection Time: 01/14/18  8:10 PM  Result Value Ref Range Status   Specimen Description BLOOD RIGHT ANTECUBITAL  Final   Special Requests   Final    BOTTLES DRAWN AEROBIC AND ANAEROBIC Blood Culture adequate volume   Culture  Setup Time   Final    GRAM POSITIVE COCCI IN BOTH AEROBIC AND ANAEROBIC BOTTLES CRITICAL RESULT CALLED TO, READ BACK BY AND VERIFIED WITH: Ellin Mayhew [facility] [phone] Performed at [facility], [address], [place], [place] [address]    Culture STAPHYLOCOCCUS AUREUS (A)  Final   Report Status 01/17/2018 FINAL  Final   Organism ID, Bacteria STAPHYLOCOCCUS AUREUS  Final      Susceptibility    Staphylococcus aureus - MIC*    CIPROFLOXACIN <=0.5 SENSITIVE Sensitive     ERYTHROMYCIN <=0.25 SENSITIVE Sensitive     GENTAMICIN <=0.5 SENSITIVE Sensitive     OXACILLIN 0.5 SENSITIVE Sensitive     TETRACYCLINE <=1 SENSITIVE Sensitive     VANCOMYCIN 1 SENSITIVE Sensitive     TRIMETH/SULFA <=10 SENSITIVE Sensitive     CLINDAMYCIN <=0.25 SENSITIVE Sensitive     RIFAMPIN <=0.5 SENSITIVE Sensitive     Inducible Clindamycin NEGATIVE Sensitive     * STAPHYLOCOCCUS AUREUS  Blood Culture ID Panel (Reflexed)     Status: Abnormal   Collection Time: 01/14/18  8:10 PM  Result Value Ref Range Status   Enterococcus species NOT DETECTED NOT DETECTED Final   Listeria monocytogenes NOT DETECTED NOT DETECTED Final   Staphylococcus species DETECTED (A) NOT DETECTED Final    Comment: CRITICAL RESULT CALLED TO, READ BACK BY AND VERIFIED WITH: PHARMD M PHAM [phone] MLM    Staphylococcus aureus DETECTED (A) NOT DETECTED Final    Comment: Methicillin (oxacillin) susceptible Staphylococcus aureus (MSSA). Preferred therapy is anti staphylococcal beta lactam antibiotic (Cefazolin or Nafcillin), unless clinically contraindicated. CRITICAL RESULT CALLED TO, READ BACK BY AND VERIFIED WITH: PHARMD M PHAM [phone] MLM    Methicillin resistance NOT DETECTED NOT DETECTED Final   Streptococcus species NOT DETECTED NOT DETECTED Final   Streptococcus agalactiae NOT DETECTED NOT DETECTED Final   Streptococcus pneumoniae NOT DETECTED NOT DETECTED Final   Streptococcus pyogenes NOT DETECTED NOT DETECTED Final   Acinetobacter baumannii NOT DETECTED NOT DETECTED Final   Enterobacteriaceae species NOT DETECTED NOT DETECTED Final   Enterobacter cloacae complex NOT DETECTED NOT DETECTED Final   Escherichia coli NOT DETECTED NOT DETECTED Final   Klebsiella oxytoca NOT DETECTED NOT DETECTED Final   Klebsiella pneumoniae NOT DETECTED NOT DETECTED Final   Proteus species NOT DETECTED NOT DETECTED Final   Serratia  marcescens NOT DETECTED NOT DETECTED Final   Haemophilus influenzae NOT DETECTED NOT DETECTED Final   Neisseria meningitidis NOT DETECTED NOT DETECTED Final   Pseudomonas aeruginosa NOT DETECTED NOT DETECTED Final   Candida albicans NOT DETECTED NOT DETECTED Final   Candida glabrata NOT DETECTED NOT DETECTED Final   Candida krusei NOT DETECTED NOT DETECTED Final   Candida parapsilosis NOT DETECTED NOT DETECTED Final   Candida tropicalis NOT DETECTED NOT DETECTED Final    Comment: Performed at [facility], [address], [place], [place] [address]  Blood Culture (routine x 2)     Status: Abnormal   Collection Time: 01/14/18  8:20  PM  Result Value Ref Range Status   Specimen Description BLOOD RIGHT FOREARM  Final   Special Requests   Final    BOTTLES DRAWN AEROBIC AND ANAEROBIC Blood Culture adequate volume   Culture  Setup Time   Final    GRAM POSITIVE COCCI IN BOTH AEROBIC AND ANAEROBIC BOTTLES CRITICAL VALUE NOTED.  VALUE IS CONSISTENT WITH PREVIOUSLY REPORTED AND CALLED VALUE.    Culture (A)  Final    STAPHYLOCOCCUS AUREUS SUSCEPTIBILITIES PERFORMED ON PREVIOUS CULTURE WITHIN THE LAST 5 DAYS. Performed at [facility], [place] [address], [place], [place] [address]    Report Status 01/17/2018 FINAL  Final  Urine culture     Status: Abnormal   Collection Time: 01/15/18  3:02 AM  Result Value Ref Range Status   Specimen Description URINE, CATHETERIZED  Final   Special Requests NONE  Final   Culture (A)  Final    <10,000 COLONIES/mL INSIGNIFICANT GROWTH Performed at [facility], [place] [address] [place], [place] [address]    Report Status 01/16/2018 FINAL  Final  MRSA PCR Screening     Status: None   Collection Time: 01/15/18  4:41 AM  Result Value Ref Range Status   MRSA by PCR NEGATIVE NEGATIVE Final    Comment:        The GeneXpert MRSA Assay (FDA approved for NASAL specimens only), is one component of a comprehensive MRSA colonization surveillance  program. It is not intended to diagnose MRSA infection nor to guide or monitor treatment for MRSA infections. Performed at [facility], [place] [address], [place], [place] [address]   Culture, blood (Routine X 2) w Reflex to ID Panel     Status: None   Collection Time: 01/15/18 12:15 PM  Result Value Ref Range Status   Specimen Description BLOOD RIGHT ANTECUBITAL  Final   Special Requests   Final    BOTTLES DRAWN AEROBIC ONLY Blood Culture results may not be optimal due to an inadequate volume of blood received in culture bottles   Culture   Final    NO GROWTH 5 DAYS Performed at [facility], [place] [address], [place], [place] [address]    Report Status 01/20/2018 FINAL  Final  Culture, blood (Routine X 2) w Reflex to ID Panel     Status: None   Collection Time: 01/15/18 12:21 PM  Result Value Ref Range Status   Specimen Description BLOOD RIGHT ARM  Final   Special Requests   Final    BOTTLES DRAWN AEROBIC ONLY Blood Culture results may not be optimal due to an inadequate volume of blood received in culture bottles   Culture   Final    NO GROWTH 5 DAYS Performed at [facility], [place] [address], [place], [place] [address]    Report Status 01/20/2018 FINAL  Final     Terri Piedra, NP [facility] [phone] Pager  01/20/2018  10:19 AM

## 2018-01-20 NOTE — Progress Notes (Addendum)
Physical Therapy Treatment Patient Details Name: [name] MRN: [id number] DOB: 1956-05-02 Today's Date: 01/20/2018    History of Present Illness Pt is a 62 y/o male s/p R transtibial amputation. PMH including but not limited to L transtibial amputation in 2016, PVD, CAD, DM and HTN.    PT Comments    Pt performed transfer training during session this pm.  Pt able to stand with max assistance +2 from low seated recliner and +2 min from higher seated surface.  Pt performed multiple reps in sara stedy and x1 in RW for edge of bed (elevated).  Pt transfer back to bed at end of session and educated on hip extension when in sidelying.  Pt slow and guarded during session with very flat affect.          Follow Up Recommendations  SNF(based on CIR denial.  )     Equipment Recommendations  None recommended by PT    Recommendations for Other Services Rehab consult     Precautions / Restrictions Precautions Precautions: Fall Required Braces or Orthoses: Other Brace/Splint Other Brace/Splint: limb protector Restrictions Weight Bearing Restrictions: Yes RLE Weight Bearing: Non weight bearing Other Position/Activity Restrictions: Pt is able to bear weight on LLE in prosthesis.      Mobility  Bed Mobility Overal bed mobility: Needs Assistance Bed Mobility: Sit to Supine       Sit to supine: Supervision   General bed mobility comments: Pt able to return to supine unassisted.    Transfers Overall transfer level: Needs assistance Equipment used: Rolling walker (2 wheeled)(Sara stedy.  ) Transfers: Sit to/from Stand Sit to Stand: Max assist;+2 physical assistance;Min assist(max assistance from standard seat height and min assist from elevated stedy plates and edge of bed.  )         General transfer comment: Cues for hand placement to and from seated surface.  Pt is fearful of falling and transitioning to standing.  Pt perseverates on innability to stand from low seated  surface.  Pt apprehensive to use sara stedy.  Attempted multiple reps with +1 assistance and patient unable to achieve standing.  Pt required +2 from low seated recliner and use of bed pad to boost into standing.  From heigher seated surface he performed sit to stand with min assistance +2.  Pt is able to maintain static stance ~30 sec with min guard assistance x3 reps.     Ambulation/Gait Ambulation/Gait assistance: (NT)               Stairs             Wheelchair Mobility    Modified Rankin (Stroke Patients Only)       Balance Overall balance assessment: Needs assistance   Sitting balance-Leahy Scale: Fair       Standing balance-Leahy Scale: Poor Standing balance comment: Pt with heavy reliance on UEs in standing.                              Cognition Arousal/Alertness: Awake/alert Behavior During Therapy: WFL for tasks assessed/performed Overall Cognitive Status: Within Functional Limits for tasks assessed                                        Exercises      General Comments        Pertinent  Vitals/Pain Pain Assessment: 0-10 Pain Score: 2  Pain Location: R residual limb at incision site and wound vac site Pain Descriptors / Indicators: Sore Pain Intervention(s): Monitored during session;Repositioned    Home Living                      Prior Function            PT Goals (current goals can now be found in the care plan section) Acute Rehab PT Goals Patient Stated Goal: to get to rehab to be as independent as possible Potential to Achieve Goals: Good Progress towards PT goals: Progressing toward goals    Frequency    Min 5X/week      PT Plan Discharge plan needs to be updated    Co-evaluation              AM-PAC PT "6 Clicks" Daily Activity  Outcome Measure  Difficulty turning over in bed (including adjusting bedclothes, sheets and blankets)?: A Lot Difficulty moving from lying on back  to sitting on the side of the bed? : A Lot Difficulty sitting down on and standing up from a chair with arms (e.g., wheelchair, bedside commode, etc,.)?: Unable Help needed moving to and from a bed to chair (including a wheelchair)?: A Lot Help needed walking in hospital room?: Total Help needed climbing 3-5 steps with a railing? : Total 6 Click Score: 9    End of Session Equipment Utilized During Treatment: Gait belt Activity Tolerance: Patient tolerated treatment well Patient left: in chair;with call bell/phone within reach Nurse Communication: Mobility status PT Visit Diagnosis: Other abnormalities of gait and mobility (R26.89);Pain Pain - Right/Left: Right Pain - part of body: Leg     Time: 1245-8099 PT Time Calculation (min) (ACUTE ONLY): 30 min  Charges:  $Therapeutic Activity: 23-37 mins                     Governor Rooks, PTA Acute Rehabilitation Services Pager [phone] Office [phone]     Sanye Ledesma Eli Hose 01/20/2018, 2:49 PM

## 2018-01-20 NOTE — NC FL2 (Signed)
[place] LEVEL OF CARE SCREENING TOOL     IDENTIFICATION  Patient Name: [name] Birthdate: August 27, 1955 Sex: male Admission Date (Current Location): 01/14/2018  [facility] and [place] Number:  Engineering geologist and Address:  [facility], [place] [address] [place], [place] [address]      Provider Number: [id number]  Attending Physician Name and Address:  Tawni Millers  Relative Name and Phone Number:  Ovidio Hanger [phone]    Current Level of Care: Hospital Recommended Level of Care: [place] Prior Approval Number:    Date Approved/Denied:   PASRR Number: [id number]  Discharge Plan: SNF    Current Diagnoses: Patient Active Problem List   Diagnosis Date Noted  . S/P bilateral BKA (below knee amputation) ([place])   . Post-operative pain   . Benign essential HTN   . Diabetes mellitus type 2 in obese ([place])   . PVD (peripheral vascular disease) ([place])   . History of prosthetic mitral valve   . Right foot infection 01/15/2018  . Sepsis ([place]) 01/15/2018  . Prosthetic valve endocarditis ([place])   . Staphylococcus aureus bacteremia   . Staphylococcal arthritis of right wrist ([place])   . Acute hematogenous osteomyelitis, right ankle and foot ([place])   . Splenic infarct 01/14/2018  . Renal insufficiency 01/14/2018  . Severe sepsis ([place]) 01/14/2018  . Dupuytren's disease of palm of both hands 03/18/2017  . Cellulitis of left lower extremity 04/26/2016  . S/P BKA (below knee amputation) unilateral, right ([place]) 12/08/2014  . Bicuspid aortic valve   . Hyperlipidemia   . Hypertension   . Greater trochanter fracture ([place]) 07/28/2014  . Hip fracture, right ([place]) 07/28/2014  . Diabetes mellitus without complication ([place]) 29/52/8413  . HLD (hyperlipidemia) 07/28/2014  . GERD (gastroesophageal reflux disease) 07/28/2014  . Hip fracture ([place]) 07/28/2014  . Fall 07/28/2014  . Leukocytosis 07/28/2014  . Thrombocytopenia  ([place]) 07/28/2014  . H/O aortic valve replacement 10/16/2011  . 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 PLEURAL EFFUSION 05/25/2009  . OPEN WOUND OF CHEST , COMPLICATED 24/40/1027  . PROSTHETIC VALVE - BIO/ ENDOPROSTHESIS 05/25/2009  . Insulin-requiring or dependent type II diabetes mellitus ([place]) 04/19/2009  . HYPERLIPIDEMIA 04/19/2009  . Essential hypertension 04/19/2009  . CAD (coronary artery disease) 04/19/2009  . Peripheral vascular disease ([place]) 04/19/2009  . OSTEOMYELITIS 04/19/2009  . BICUSPID AORTIC VALVE 04/19/2009    Orientation RESPIRATION BLADDER Height & Weight     Self, Time, Situation, Place  Normal Continent Weight: 209 lb 14.1 oz (95.2 kg) Height:  5\' 10"  (177.8 cm)  BEHAVIORAL SYMPTOMS/MOOD NEUROLOGICAL BOWEL NUTRITION STATUS      Continent Diet(please see Discharge Summary)  AMBULATORY STATUS COMMUNICATION OF NEEDS Skin     Verbally Other (Comment)(incison(closed) right-leg, negative pressure wound knee-right)                       Personal Care Assistance Level of Assistance  Bathing, Feeding, Dressing Bathing Assistance: Maximum assistance Feeding assistance: Independent Dressing Assistance: Maximum assistance     Functional Limitations Info  Sight, Hearing, Speech Sight Info: Adequate Hearing Info: Adequate Speech Info: Adequate    SPECIAL CARE FACTORS FREQUENCY  PT (By licensed PT), OT (By licensed OT)     PT Frequency: 5x per wk OT Frequency: 5x per wk            Contractures Contractures Info: Not present    Additional Factors Info  Code Status, Allergies  Code Status Info: FULL Allergies Info: Ace Inhibitors           Current Medications (01/20/2018):  This is the current hospital active medication list Current Facility-Administered Medications  Medication Dose Route Frequency Provider Last Rate Last Dose  . 0.9 %  sodium chloride infusion   Intravenous Continuous Newt Minion, MD 10 mL/hr at 01/18/18 1812 10 mL/hr at  01/18/18 1812  . acetaminophen (TYLENOL) tablet 650 mg  650 mg Oral Q6H PRN Kipp Brood, MD   650 mg at 01/15/18 1743  . aspirin tablet 325 mg  325 mg Oral Daily Kipp Brood, MD   325 mg at 01/20/18 0831  . bisacodyl (DULCOLAX) suppository 10 mg  10 mg Rectal Daily PRN Newt Minion, MD      . calcium-vitamin D (OSCAL WITH D) 500-200 MG-UNIT per tablet 1 tablet  1 tablet Oral BID Kipp Brood, MD   1 tablet at 01/20/18 0831  . ceFAZolin (ANCEF) IVPB 2g/100 mL premix  2 g Intravenous Q8H Newt Minion, MD 200 mL/hr at 01/20/18 1234 2 g at 01/20/18 1234  . heparin injection 5,000 Units  5,000 Units Subcutaneous Q8H Kipp Brood, MD   5,000 Units at 01/20/18 1230  . Influenza vac split quadrivalent PF (FLUARIX) injection 0.5 mL  0.5 mL Intramuscular Tomorrow-1000 Newt Minion, MD      . lactated ringers infusion   Intravenous Continuous Newt Minion, MD 10 mL/hr at 01/17/18 1115    . latanoprost (XALATAN) 0.005 % ophthalmic solution 1 drop  1 drop Both Eyes QHS Kipp Brood, MD   1 drop at 01/19/18 2108  . methocarbamol (ROBAXIN) tablet 500 mg  500 mg Oral Q6H PRN Newt Minion, MD   500 mg at 01/19/18 2104   Or  . methocarbamol (ROBAXIN) 500 mg in dextrose 5 % 50 mL IVPB  500 mg Intravenous Q6H PRN Newt Minion, MD      . metoprolol tartrate (LOPRESSOR) tablet 50 mg  50 mg Oral BID Tawni Millers, MD   50 mg at 01/20/18 0831  . multivitamin with minerals tablet 1 tablet  1 tablet Oral Daily Kipp Brood, MD   1 tablet at 01/20/18 0831  . ondansetron (ZOFRAN) tablet 4 mg  4 mg Oral Q6H PRN Newt Minion, MD       Or  . ondansetron [facility]) injection 4 mg  4 mg Intravenous Q6H PRN Newt Minion, MD   4 mg at 01/19/18 1722  . oxyCODONE (Oxy IR/ROXICODONE) immediate release tablet 10-15 mg  10-15 mg Oral Q4H PRN Newt Minion, MD   15 mg at 01/17/18 1706  . oxyCODONE (Oxy IR/ROXICODONE) immediate release tablet 5-10 mg  5-10 mg Oral Q4H PRN Newt Minion, MD   5 mg at  01/19/18 2105  . pantoprazole (PROTONIX) EC tablet 40 mg  40 mg Oral Daily Kipp Brood, MD   40 mg at 01/20/18 0832  . polyethylene glycol (MIRALAX / GLYCOLAX) packet 17 g  17 g Oral Daily PRN Newt Minion, MD      . simvastatin (ZOCOR) tablet 20 mg  20 mg Oral q1800 Kipp Brood, MD   20 mg at 01/19/18 1722  . sodium chloride flush (NS) 0.9 % injection 3 mL  3 mL Intravenous Q12H Kipp Brood, MD   3 mL at 01/20/18 0832  . sucralfate (CARAFATE) tablet 1 g  1 g Oral TID WC & HS Newt Minion, MD  1 g at 01/20/18 1230     Discharge Medications: Please see discharge summary for a list of discharge medications.  Relevant Imaging Results:  Relevant Lab Results:   Additional Information SSN # [id number]  Vinie Sill, LCSWA

## 2018-01-20 NOTE — Progress Notes (Addendum)
[facility] for Infectious Disease  Date of Admission:  01/14/2018     Total days of antibiotics: 7 (6 ancef)         ASSESSMENT/PLAN  Mr. Struble has complicated Staphylococcus aureus bacteremia with concern for endocarditis given his splenic infarcts and POD #3 from right below knee amputation. Fever curve and white blood cell count are down trending with repeat cultures finalized and clear. He is on Day 7 of antimicrobial therapy with Day 6 of Cefazolin. Awaiting TEE with scheduling in process to determine length of treatment.   1. Continue current dose of Cefazolin. 2. PICC pending TEE results. 3. Will d/i home care.    Principal Problem:   Staphylococcus aureus bacteremia Active Problems:   Insulin-requiring or dependent type II diabetes mellitus (HCC)   CAD (coronary artery disease)   Thrombocytopenia (HCC)   S/P BKA (below knee amputation) unilateral, right (HCC)   Splenic infarct   Renal insufficiency   Severe sepsis (HCC)   Right foot infection   Sepsis ([place])   Prosthetic valve endocarditis (HCC)   Staphylococcal arthritis of right wrist (HCC)   Acute hematogenous osteomyelitis, right ankle and foot ([place])   History of prosthetic mitral valve   S/P bilateral BKA (below knee amputation) (HCC)   Post-operative pain   Benign essential HTN   Diabetes mellitus type 2 in obese ([place])   PVD (peripheral vascular disease) ([place])   . aspirin  325 mg Oral Daily  . calcium-vitamin D  1 tablet Oral BID  . heparin  5,000 Units Subcutaneous Q8H  . Influenza vac split quadrivalent PF  0.5 mL Intramuscular Tomorrow-1000  . latanoprost  1 drop Both Eyes QHS  . metoprolol tartrate  50 mg Oral BID  . multivitamin with minerals  1 tablet Oral Daily  . pantoprazole  40 mg Oral Daily  . simvastatin  20 mg Oral q1800  . sodium chloride flush  3 mL Intravenous Q12H  . sucralfate  1 g Oral TID WC & HS    SUBJECTIVE:  Afebrile overnight.  No complaints   Allergies    Allergen Reactions  . Ace Inhibitors Cough     Review of Systems: ROS negative   OBJECTIVE: Vitals:   01/19/18 1601 01/19/18 2059 01/20/18 0522 01/20/18 0828  BP: 139/77 140/82 (!) 148/80 (!) 143/80  Pulse: (!) 101 89 93 96  Resp:  18 18   Temp:  98.1 F (36.7 C) 97.6 F (36.4 C)   TempSrc:  Oral Oral   SpO2:  95% 96%   Weight:      Height:       Body mass index is 30.11 kg/m.  Physical Exam  Constitutional: He appears well-developed and well-nourished.  Cardiovascular: Normal rate, regular rhythm and normal heart sounds.  Pulmonary/Chest: Effort normal and breath sounds normal.  Abdominal: Soft. Bowel sounds are normal.  Musculoskeletal:       Legs:   Lab Results Lab Results  Component Value Date   WBC 13.5 (H) 01/19/2018   HGB 13.2 01/19/2018   HCT 40.8 01/19/2018   MCV 86.8 01/19/2018   PLT 112 (L) 01/19/2018    Lab Results  Component Value Date   CREATININE 1.09 01/17/2018   BUN 24 (H) 01/17/2018   NA 139 01/17/2018   K 4.7 01/17/2018   CL 110 01/17/2018   CO2 22 01/17/2018    Lab Results  Component Value Date   ALT 17 01/15/2018   AST 32 01/15/2018  ALKPHOS 47 01/15/2018   BILITOT 0.8 01/15/2018     Microbiology: Recent Results (from the past 240 hour(s))  Blood Culture (routine x 2)     Status: Abnormal   Collection Time: 01/14/18  8:10 PM  Result Value Ref Range Status   Specimen Description BLOOD RIGHT ANTECUBITAL  Final   Special Requests   Final    BOTTLES DRAWN AEROBIC AND ANAEROBIC Blood Culture adequate volume   Culture  Setup Time   Final    GRAM POSITIVE COCCI IN BOTH AEROBIC AND ANAEROBIC BOTTLES CRITICAL RESULT CALLED TO, READ BACK BY AND VERIFIED WITH: Ellin Mayhew [facility] [phone] MLM Performed at [facility], [address] [place], [place] [address]    Culture STAPHYLOCOCCUS AUREUS (A)  Final   Report Status 01/17/2018 FINAL  Final   Organism ID, Bacteria STAPHYLOCOCCUS AUREUS  Final      Susceptibility    Staphylococcus aureus - MIC*    CIPROFLOXACIN <=0.5 SENSITIVE Sensitive     ERYTHROMYCIN <=0.25 SENSITIVE Sensitive     GENTAMICIN <=0.5 SENSITIVE Sensitive     OXACILLIN 0.5 SENSITIVE Sensitive     TETRACYCLINE <=1 SENSITIVE Sensitive     VANCOMYCIN 1 SENSITIVE Sensitive     TRIMETH/SULFA <=10 SENSITIVE Sensitive     CLINDAMYCIN <=0.25 SENSITIVE Sensitive     RIFAMPIN <=0.5 SENSITIVE Sensitive     Inducible Clindamycin NEGATIVE Sensitive     * STAPHYLOCOCCUS AUREUS  Blood Culture ID Panel (Reflexed)     Status: Abnormal   Collection Time: 01/14/18  8:10 PM  Result Value Ref Range Status   Enterococcus species NOT DETECTED NOT DETECTED Final   Listeria monocytogenes NOT DETECTED NOT DETECTED Final   Staphylococcus species DETECTED (A) NOT DETECTED Final    Comment: CRITICAL RESULT CALLED TO, READ BACK BY AND VERIFIED WITH: PHARMD M PHAM [phone] MLM    Staphylococcus aureus DETECTED (A) NOT DETECTED Final    Comment: Methicillin (oxacillin) susceptible Staphylococcus aureus (MSSA). Preferred therapy is anti staphylococcal beta lactam antibiotic (Cefazolin or Nafcillin), unless clinically contraindicated. CRITICAL RESULT CALLED TO, READ BACK BY AND VERIFIED WITH: PHARMD M PHAM [phone] MLM    Methicillin resistance NOT DETECTED NOT DETECTED Final   Streptococcus species NOT DETECTED NOT DETECTED Final   Streptococcus agalactiae NOT DETECTED NOT DETECTED Final   Streptococcus pneumoniae NOT DETECTED NOT DETECTED Final   Streptococcus pyogenes NOT DETECTED NOT DETECTED Final   Acinetobacter baumannii NOT DETECTED NOT DETECTED Final   Enterobacteriaceae species NOT DETECTED NOT DETECTED Final   Enterobacter cloacae complex NOT DETECTED NOT DETECTED Final   Escherichia coli NOT DETECTED NOT DETECTED Final   Klebsiella oxytoca NOT DETECTED NOT DETECTED Final   Klebsiella pneumoniae NOT DETECTED NOT DETECTED Final   Proteus species NOT DETECTED NOT DETECTED Final   Serratia  marcescens NOT DETECTED NOT DETECTED Final   Haemophilus influenzae NOT DETECTED NOT DETECTED Final   Neisseria meningitidis NOT DETECTED NOT DETECTED Final   Pseudomonas aeruginosa NOT DETECTED NOT DETECTED Final   Candida albicans NOT DETECTED NOT DETECTED Final   Candida glabrata NOT DETECTED NOT DETECTED Final   Candida krusei NOT DETECTED NOT DETECTED Final   Candida parapsilosis NOT DETECTED NOT DETECTED Final   Candida tropicalis NOT DETECTED NOT DETECTED Final    Comment: Performed at [facility], [address], [place], [place] [address]  Blood Culture (routine x 2)     Status: Abnormal   Collection Time: 01/14/18  8:20  PM  Result Value Ref Range Status   Specimen Description BLOOD RIGHT FOREARM  Final   Special Requests   Final    BOTTLES DRAWN AEROBIC AND ANAEROBIC Blood Culture adequate volume   Culture  Setup Time   Final    GRAM POSITIVE COCCI IN BOTH AEROBIC AND ANAEROBIC BOTTLES CRITICAL VALUE NOTED.  VALUE IS CONSISTENT WITH PREVIOUSLY REPORTED AND CALLED VALUE.    Culture (A)  Final    STAPHYLOCOCCUS AUREUS SUSCEPTIBILITIES PERFORMED ON PREVIOUS CULTURE WITHIN THE LAST 5 DAYS. Performed at [facility], [place] [address], [place], [place] [address]    Report Status 01/17/2018 FINAL  Final  Urine culture     Status: Abnormal   Collection Time: 01/15/18  3:02 AM  Result Value Ref Range Status   Specimen Description URINE, CATHETERIZED  Final   Special Requests NONE  Final   Culture (A)  Final    <10,000 COLONIES/mL INSIGNIFICANT GROWTH Performed at [facility], [place] [address], [place], [place] [address]    Report Status 01/16/2018 FINAL  Final  MRSA PCR Screening     Status: None   Collection Time: 01/15/18  4:41 AM  Result Value Ref Range Status   MRSA by PCR NEGATIVE NEGATIVE Final    Comment:        The GeneXpert MRSA Assay (FDA approved for NASAL specimens only), is one component of a comprehensive MRSA colonization surveillance  program. It is not intended to diagnose MRSA infection nor to guide or monitor treatment for MRSA infections. Performed at [facility], [place] [address], [place], [place] [address]   Culture, blood (Routine X 2) w Reflex to ID Panel     Status: None   Collection Time: 01/15/18 12:15 PM  Result Value Ref Range Status   Specimen Description BLOOD RIGHT ANTECUBITAL  Final   Special Requests   Final    BOTTLES DRAWN AEROBIC ONLY Blood Culture results may not be optimal due to an inadequate volume of blood received in culture bottles   Culture   Final    NO GROWTH 5 DAYS Performed at [facility], [place] [address], [place], [place] [address]    Report Status 01/20/2018 FINAL  Final  Culture, blood (Routine X 2) w Reflex to ID Panel     Status: None   Collection Time: 01/15/18 12:21 PM  Result Value Ref Range Status   Specimen Description BLOOD RIGHT ARM  Final   Special Requests   Final    BOTTLES DRAWN AEROBIC ONLY Blood Culture results may not be optimal due to an inadequate volume of blood received in culture bottles   Culture   Final    NO GROWTH 5 DAYS Performed at [facility], [place] [address] [place], [place] [address]    Report Status 01/20/2018 FINAL  Final     Terri Piedra, NP [facility] [phone] Pager  01/20/2018  10:19 AM

## 2018-01-20 NOTE — Plan of Care (Signed)
  Problem: Activity: Goal: Ability to perform//tolerate increased activity and mobilize with assistive devices will improve Outcome: Progressing   Problem: Self-Care: Goal: Ability to meet self-care needs will improve Outcome: Progressing   Problem: Self-Concept: Goal: Ability to maintain and perform role responsibilities to the fullest extent possible will improve Outcome: Progressing   Problem: Pain Management: Goal: Pain level will decrease with appropriate interventions Outcome: Progressing   Problem: Skin Integrity: Goal: Demonstration of wound healing without infection will improve Outcome: Progressing

## 2018-01-20 NOTE — Consult Note (Signed)
Physical Medicine and Rehabilitation Consult Reason for Consult: Decreased functional mobility Referring Physician: [facility]   HPI: [name] is a 62 y.o. 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  Per chart review patient lives alone.  One level mobile home with ramped entrance that is not wheelchair accessible.  Independent with assistive device prior to admission using left lower extremity prosthesis by [employer] and a cane.  Family in the area that check on him but cannot provide 24-hour care.  Presented 01/15/2018 with malaise, vomiting, lower abdominal pain and right foot wound with drainage.  Patient noted to be hypotensive 90/60, creatinine 1.84, WBC 19,700.  Cranial CT scan negative.  Chest x-ray of left lower lobe atelectasis.  CT of abdomen and peripheral wedge-shaped defect in the spleen consistent with splenic infarction.  Patient placed on sepsis protocol.  MRI of the right foot showed changes suggesting cellulitis.  Infectious disease consulted due to sepsis/MSSA bacteremia in the setting of bioprosthetic aortic valve and splenic infarctions.  Await plan for possible TEE.  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 Sharol Given.  Hospital course pain management.  Currently maintained on IV Ancef await plan for duration.  Subcutaneous heparin for DVT prophylaxis.  Therapy evaluation completed with recommendations of physical medicine rehab consult.   Review of Systems  Constitutional: Positive for malaise/fatigue.  HENT: Negative for hearing loss.   Eyes: Negative for blurred vision and double vision.  Respiratory: Negative for cough and shortness of breath.   Cardiovascular: Positive for leg swelling.  Gastrointestinal: Positive for abdominal pain  and nausea.       GERD  Genitourinary: Negative for dysuria, flank pain and hematuria.  Musculoskeletal: Positive for joint pain and myalgias.  Skin: Negative for rash.  All other systems reviewed and are negative.  Past Medical History:  Diagnosis Date  . Bicuspid aortic valve    a. 02/2009 s/p tissue AVR;  b. 07/2014 Echo: EF 55-60%, basal-mid inf HK, mild AS/MR, mod dil LA, PASP 44 mmHg.  Marland Kitchen CAD (coronary artery disease) October 2010   a. 02/2009 NSTEMI/Cath: 3VD; b. 02/2009 CABG x 5: LIMA->LAD, VG->Diag, VG->RI, VG->RVM->RPDA.  Marland Kitchen GERD (gastroesophageal reflux disease)   . Heart murmur   . Hyperlipidemia   . Hypertension   . Myocardial infarction ([place]) 2010  . PVD (peripheral vascular disease) ([place])    a. s/p toe amputations on L foot.  . Type II diabetes mellitus ([place])    Past Surgical History:  Procedure Laterality Date  . AMPUTATION Left 2011   great and second toes  . AMPUTATION  07/27/2011   Procedure: AMPUTATION FOOT;  Surgeon: Newt Minion, MD;  Location: [place];  Service: Orthopedics;  Laterality: Left;  Left Midfoot Amputation   . AMPUTATION Left 12/08/2014   Procedure: Left Below Knee Amputation;  Surgeon: Newt Minion, MD;  Location: [place];  Service: Orthopedics;  Laterality: Left;  . AMPUTATION Right 01/17/2018   Procedure: RIGHT BELOW KNEE AMPUTATION;  Surgeon: Newt Minion, MD;  Location: [place];  Service: Orthopedics;  Laterality: Right;  . AMPUTATION TOE Right    "all my toes on my right foot have been amputated"  . AORTIC VALVE REPLACEMENT  2010   Pericardial tissue valve  . CARDIAC CATHETERIZATION  2010  . CARDIAC VALVE REPLACEMENT    .  CATARACT EXTRACTION W/ INTRAOCULAR LENS  IMPLANT, BILATERAL Bilateral   . CORONARY ARTERY BYPASS GRAFT  2010   "CABG X5"   Family History  Problem Relation Age of Onset  . Heart attack Father        early age  . Heart disease Father   . Stroke Father   . Heart attack Brother        At age 5  . Stroke Brother   .  Diabetes type II Mother   . Diabetes type II Sister   . Diabetes Unknown   . Coronary artery disease Unknown    Social History:  reports that he quit smoking about 17 years ago. His smoking use included cigarettes. He has a 24.00 pack-year smoking history. He has never used smokeless tobacco. He reports that he drinks alcohol. He reports that he does not use drugs. Allergies:  Allergies  Allergen Reactions  . Ace Inhibitors Cough   Medications Prior to Admission  Medication Sig Dispense Refill  . aspirin 325 MG tablet Take 325 mg by mouth daily.      . calcium-vitamin D (OSCAL WITH D) 500-200 MG-UNIT per tablet Take 1 tablet by mouth 2 (two) times daily.     . cetirizine (ZYRTEC) 10 MG tablet Take 10 mg by mouth daily.    . Dulaglutide (TRULICITY) 1.5 VQ/2.5ZD SOPN Inject 1.5 mg into the skin once a week.    Marland Kitchen FARXIGA 10 MG TABS tablet Take 10 mg by mouth daily.  2  . glipiZIDE (GLUCOTROL XL) 10 MG 24 hr tablet Take 10 mg by mouth 2 (two) times daily.   0  . latanoprost (XALATAN) 0.005 % ophthalmic solution Place 1 drop into both eyes at bedtime.  3  . losartan (COZAAR) 50 MG tablet Take 50 mg by mouth daily.   98  . metoprolol (LOPRESSOR) 50 MG tablet TAKE 2 TABLETS BY MOUTH EVERY MORNING AND 1 EVERY EVENING (Patient taking differently: Take 50-100 mg by mouth See admin instructions. Take 100 mg by mouth in the morning and 50 mg in the evening) 90 tablet 5  . Multiple Vitamin (MULTIVITAMIN) tablet Take 1 tablet by mouth daily.      . naproxen sodium (ANAPROX) 220 MG tablet Take 220 mg by mouth 2 (two) times daily as needed (pain).    . pantoprazole (PROTONIX) 40 MG tablet Take 40 mg by mouth daily.      . simvastatin (ZOCOR) 20 MG tablet Take 20 mg by mouth daily at 6 PM.   98  . TOUJEO SOLOSTAR 300 UNIT/ML SOPN Inject 65 Units into the skin See admin instructions. Use up to 100 units daily  4  . TANZEUM 50 MG PEN Inject 50 mg into the skin every Monday.  57    Home: Home  Living Family/patient expects to be discharged to:: Private residence Living Arrangements: Alone Available Help at Discharge: Family, Available PRN/intermittently Type of Home: Mobile home Home Access: Ramped entrance [place]: One level Bathroom Shower/Tub: Tub/shower unit Home Equipment: Shower seat, Engineer, manufacturing held shower head, Environmental consultant - 2 wheels, Wheelchair - manual, Cane - single point  Functional History: Prior Function Level of Independence: Independent with assistive device(s) Comments: has a L LE prosthesis; used a cane PRN on uneven surfaces Functional Status:  Mobility: Bed Mobility Overal bed mobility: Needs Assistance Bed Mobility: Supine to Sit Supine to sit: Min assist, HOB elevated Sit to supine: Supervision General bed mobility comments: min assist for trunk elevation into long sitting  Transfers Overall transfer level: Needs assistance Equipment used: None Transfers: Government social research officer transfers: +2 physical assistance, Mod assist General transfer comment: assist with bed pad for posterior scoot to EOB and posterior scoot into recliner. Pt with good UE strength and trunk control. Ambulation/Gait General Gait Details: unable    ADL:    Cognition: Cognition Overall Cognitive Status: Within Functional Limits for tasks assessed Orientation Level: Oriented X4 Cognition Arousal/Alertness: Awake/alert Behavior During Therapy: WFL for tasks assessed/performed Overall Cognitive Status: Within Functional Limits for tasks assessed  Blood pressure (!) 148/80, pulse 93, temperature 97.6 F (36.4 C), temperature source Oral, resp. rate 18, height 5\' 10"  (1.778 m), weight 95.2 kg, SpO2 96 %. Physical Exam  Vitals reviewed. Constitutional: He is oriented to person, place, and time. He appears well-developed.  Obese  HENT:  Head: Normocephalic and atraumatic.  Eyes: EOM are normal. Right eye exhibits no discharge. Left eye exhibits no  discharge.  Neck: Normal range of motion. Neck supple. No thyromegaly present.  Cardiovascular: Normal rate, regular rhythm and normal heart sounds.  Respiratory: Effort normal and breath sounds normal. No respiratory distress.  GI: Soft. Bowel sounds are normal. He exhibits no distension.  Musculoskeletal:  No edema or tenderness in extremities Left BKA Right BKA B/l intrinsic hand atrophy  Neurological: He is alert and oriented to person, place, and time.  Motor: B/l UE 4/5 proximal to distal RLE: HF: 4/5 (pain inhibition) LLE: HF, KE 4+/5   Skin:  Left BKA is well-healed.  Right BKA with stump protector and VAC in place.  Psychiatric: He has a normal mood and affect. His behavior is normal.    Results for orders placed or performed during the hospital encounter of 01/14/18 (from the past 24 hour(s))  Glucose, capillary     Status: Abnormal   Collection Time: 01/19/18  6:46 AM  Result Value Ref Range   Glucose-Capillary 173 (H) 70 - 99 mg/dL  Glucose, capillary     Status: Abnormal   Collection Time: 01/19/18 11:19 AM  Result Value Ref Range   Glucose-Capillary 173 (H) 70 - 99 mg/dL  Glucose, capillary     Status: Abnormal   Collection Time: 01/19/18  4:29 PM  Result Value Ref Range   Glucose-Capillary 181 (H) 70 - 99 mg/dL  Glucose, capillary     Status: Abnormal   Collection Time: 01/19/18  9:00 PM  Result Value Ref Range   Glucose-Capillary 174 (H) 70 - 99 mg/dL   No results found.  Assessment/Plan: Diagnosis: Right BKA with history of left BKA Labs independently reviewed.  Records reviewed and summated above. Clean amputation daily with soap and water Monitor incision site for signs of infection or impending skin breakdown. Staples to remain in place for 3-4 weeks Stump shrinker, for edema control  Scar mobilization massaging to prevent soft tissue adherence Stump protector during therapies Prevent flexion contractures by implementing the following:   Encourage  prone lying for 20-30 mins per day BID to avoid hip flexion  Contractures if medically appropriate;  Avoid pillow under knees when patient is lying in bed in order to prevent both  knee and hip flexion contractures;  Avoid prolonged sitting Post surgical pain control with oral medication Phantom limb pain control with physical modalities including desensitization techniques (gentle self massage to the residual stump,hot packs if sensation intact, [place]) and mirror therapy, TENS. If ineffective, consider pharmacological treatment for neuropathic pain (e.g gabapentin, pregabalin, amytriptalyine, duloxetine).  When using wheelchair, patient should have knee  on amputated side fully extended with board under the seat cushion.  1. Does the need for close, 24 hr/day medical supervision in concert with the patient's rehab needs make it unreasonable for this patient to be served in a less intensive setting? Potentially  2. Co-Morbidities requiring supervision/potential complications: post-op pain management (Biofeedback training with therapies to help reduce reliance on opiate pain medications, monitor pain control during therapies, and sedation at rest and titrate to maximum efficacy to ensure participation and gains in therapies), bacteremia (cont IV abx, recs per ID), bicuspid aortic valve (further workup per Cards), CAD/NSTEMI (cont meds), HTN (monitor and provide prns in accordance with increased physical exertion and pain), hyperlipidemia, type 2 DM (Monitor in accordance with exercise and adjust meds as necessary), peripheral vascular disease, leukocytosis (cont to monitor for signs and symptoms of infection, further workup if indicated) 3. Due to safety, skin/wound care, disease management, pain management and patient education, does the patient require 24 hr/day rehab nursing? Yes 4. Does the patient require coordinated care of a physician, rehab nurse, PT (1-2 hrs/day, 5 days/week) and OT (1-2 hrs/day, 5  days/week) to address physical and functional deficits in the context of the above medical diagnosis(es)? Yes Addressing deficits in the following areas: balance, endurance, locomotion, strength, transferring, bathing, dressing, toileting and psychosocial support 5. Can the patient actively participate in an intensive therapy program of at least 3 hrs of therapy per day at least 5 days per week? Yes 6. The potential for patient to make measurable gains while on inpatient rehab is excellent 7. Anticipated functional outcomes upon discharge from inpatient rehab are supervision  with PT, Supervision with OT, n/a with SLP. 8. Estimated rehab length of stay to reach the above functional goals is: 10-14 days. 9. Anticipated D/C setting: Other 10. Anticipated post D/C treatments: SNF 11. Overall Rehab/Functional Prognosis: good  RECOMMENDATIONS: This patient's condition is appropriate for continued rehabilitative care in the following setting: Patient states he is leaning toward SNF after discussion with Ortho.  Agree with recommendations for SNF with outpatient PM&R follow up after completion of medical workup.  Patient without caregiver support and lives in home with limited wheelchair accesability.  Patient has agreed to participate in recommended program. Yes Note that insurance prior authorization may be required for reimbursement for recommended care.  Comment: Rehab Admissions Coordinator to follow up.   I have personally performed a face to face diagnostic evaluation, including, but not limited to relevant history and physical exam findings, of this patient and developed relevant assessment and plan.  Additionally, I have reviewed and concur with the physician assistant's documentation above.   Delice Lesch, MD, ABPMR Lavon Paganini Angiulli, PA-C 01/20/2018

## 2018-01-20 NOTE — Progress Notes (Signed)
    [facility] has been requested to perform a transesophageal echocardiogram on [facility] for bacteremia.  After careful review of history and examination, the risks and benefits of transesophageal echocardiogram have been explained including risks of esophageal damage, perforation (1:10,000 risk), bleeding, pharyngeal hematoma as well as other potential complications associated with conscious sedation including aspiration, arrhythmia, respiratory failure and death. Alternatives to treatment were discussed, questions were answered. Patient is willing to proceed.   Kathyrn Drown, NP  01/20/2018 2:46 PM

## 2018-01-20 NOTE — Progress Notes (Signed)
PROGRESS NOTE    [name]  UXL:[id number] DOB: [birth year] DOA: 01/14/2018 PCP: [name], MD    Brief Narrative:  62 year old male who presented with malaise, vomiting, lower abdominal pain and right foot woundbleeding. He does have thesignificant past medical history for coronary artery disease,bioprostheticaortic valve replacement, type 2 diabetes mellitus, peripheral vascular disease status post left BKA and right TMA. He had generalized malaise, associated with nausea, vomiting and abdominal pain, positive bleeding on his right foot wound ulcer. On his initial physical examination he was tachypneic, tachycardic 120 bpm, hypotensive 90/60. Ill looking appearing, dry mucous membranes, lungs clear to auscultation, heart S1-S2 present, tachycardic, abdomen with generalized tenderness, right mid foot ulcerated wound, with surrounding edema and erythema. Sodium 135, potassium 3.8, chloride 100, bicarb 19, glucose 182, BUN 19, creatinine 1.84, white count 19.7, hemoglobin 15.7, hematocrit 47.7, platelets 124, venous lactic acid 6.2,urinalysis with 30 protein, specific gravity 1.045, urinary sodium less than 10.Head CT negative for acute changes. Chest radiograph with left lower lobe atelectasis.CT of the abdomen with peripheral wedge-shaped defect in the spleen consistent with splenic infarct.  Patient was admitted to the hospital withtheworking diagnosis of sepsis due to right foot cellulitis, complicated by acute kidney injury.   Assessment & Plan:   Principal Problem:   Severe sepsis ([place]) Active Problems:   Insulin-requiring or dependent type II diabetes mellitus (HCC)   CAD (coronary artery disease)   Thrombocytopenia (HCC)   S/P BKA (below knee amputation) unilateral, right (HCC)   Splenic infarct   Renal insufficiency   Right foot infection   Sepsis ([place])   Prosthetic valve endocarditis (HCC)   Staphylococcus aureus bacteremia with sepsis (HCC)  Staphylococcal arthritis of right wrist (HCC)   Acute hematogenous osteomyelitis, right ankle and foot ([place])   History of prosthetic mitral valve    1. Sepsis due to rightdiabeticfoot osteomyelitis/MSSA bacteremia (in the setting on bioprosthetic aortic valve and splenic infarcts). sp right transtibial amputation. Continue IV cefazolin for antibiotic therapy. Pending TEE for final antibiotic therapy recommendations. Cardiology has been consulted over the weekend.   2.Pre-renalAKI.Clinically has resolved.   3. T2DMwith hypoglycemia. Po intake has improved. Capillary glucose 174 to 170. Continue to hold on insulin for now, to prevent hypoglycemia.   4. Coronary artery disease.Continue aspirin and simvastatin per home regimen.   5. Dysphagia. Has improved with antiacid therapy with pantoprazole and sucralfate.   6. HTN. Patient now on metoprolol, systolic blood pressure 536'U with HR 93 to 96 BPM.   DVT prophylaxis:heparin Code Status:full Family Communication:no family at the bedside  Disposition Plan/ discharge barriers:pending TEE and inpatient rehab placement.  Consultants:  ID  Orthopedics  Procedures:    Antimicrobials: IV Cefazolin   Subjective: This am with improved nausea, tolerating po well, pain at the surgical wound is controlled, no chest pain or dyspnea.   Objective: Vitals:   01/19/18 1601 01/19/18 2059 01/20/18 0522 01/20/18 0828  BP: 139/77 140/82 (!) 148/80 (!) 143/80  Pulse: (!) 101 89 93 96  Resp:  18 18   Temp:  98.1 F (36.7 C) 97.6 F (36.4 C)   TempSrc:  Oral Oral   SpO2:  95% 96%   Weight:      Height:       No intake or output data in the 24 hours ending 01/20/18 0910 Filed Weights   01/14/18 2012 01/15/18 0500 01/16/18 0500  Weight: 99.8 kg 96.1 kg 95.2 kg    Examination:   General: deconditioned  Neurology: Awake  and alert, non focal  E ENT: mild pallor, no icterus, oral mucosa  moist Cardiovascular: No JVD. S1-S2 present, rhythmic, no gallops, rubs, or murmurs. No lower extremity edema. Pulmonary: vesicular breath sounds bilaterally, adequate air movement, no wheezing, rhonchi or rales. Gastrointestinal. Abdomen with no organomegaly, non tender, no rebound or guarding Skin. No rashes Musculoskeletal: bilateral BKA     Data Reviewed: I have personally reviewed following labs and imaging studies  CBC: Recent Labs  Lab 01/14/18 1926 01/15/18 0246 01/16/18 0327 01/17/18 0355 01/19/18 0315  WBC 19.7* 23.1* 15.4* 12.7* 13.5*  NEUTROABS 17.9* 21.1*  --  10.5* 10.6*  HGB 15.7 12.1* 12.5* 12.6* 13.2  HCT 47.7 37.8* 37.8* 38.0* 40.8  MCV 88.0 89.6 87.1 87.0 86.8  PLT 124* 84* 80* 86* 694*   Basic Metabolic Panel: Recent Labs  Lab 01/14/18 1926 01/15/18 0246 01/15/18 0602 01/16/18 0327 01/17/18 0355  NA 135 136 138 138 139  K 3.8 4.4 4.7 3.6 4.7  CL 100 111 109 112* 110  CO2 19* 15* 15* 19* 22  GLUCOSE 182* 195* 210* 90 96  BUN 19 24* 26* 34* 24*  CREATININE 1.84* 1.78* 1.75* 1.27* 1.09  CALCIUM 9.0 7.1* 7.2* 7.4* 7.9*  MG  --   --   --  2.0  --   PHOS  --   --   --  1.9*  --    GFR: Estimated Creatinine Clearance: 82.4 mL/min (by C-G formula based on SCr of 1.09 mg/dL). Liver Function Tests: Recent Labs  Lab 01/14/18 1926 01/15/18 0246  AST 36 32  ALT 20 17  ALKPHOS 78 47  BILITOT 1.4* 0.8  PROT 7.1 5.0*  ALBUMIN 3.8 2.6*   Recent Labs  Lab 01/14/18 1926  LIPASE 23   Recent Labs  Lab 01/14/18 1927  AMMONIA 36*   Coagulation Profile: Recent Labs  Lab 01/14/18 1926 01/15/18 0246  INR 1.43 1.72   Cardiac Enzymes: No results for input(s): CKTOTAL, CKMB, CKMBINDEX, TROPONINI in the last 168 hours. BNP (last 3 results) No results for input(s): PROBNP in the last 8760 hours. HbA1C: No results for input(s): HGBA1C in the last 72 hours. CBG: Recent Labs  Lab 01/19/18 0646 01/19/18 1119 01/19/18 1629 01/19/18 2100  01/20/18 0643  GLUCAP 173* 173* 181* 174* 170*   Lipid Profile: No results for input(s): CHOL, HDL, LDLCALC, TRIG, CHOLHDL, LDLDIRECT in the last 72 hours. Thyroid Function Tests: No results for input(s): TSH, T4TOTAL, FREET4, T3FREE, THYROIDAB in the last 72 hours. Anemia Panel: No results for input(s): VITAMINB12, FOLATE, FERRITIN, TIBC, IRON, RETICCTPCT in the last 72 hours.    Radiology Studies: I have reviewed all of the imaging during this hospital visit personally     Scheduled Meds: . aspirin  325 mg Oral Daily  . calcium-vitamin D  1 tablet Oral BID  . heparin  5,000 Units Subcutaneous Q8H  . Influenza vac split quadrivalent PF  0.5 mL Intramuscular Tomorrow-1000  . latanoprost  1 drop Both Eyes QHS  . metoprolol tartrate  50 mg Oral BID  . multivitamin with minerals  1 tablet Oral Daily  . pantoprazole  40 mg Oral Daily  . simvastatin  20 mg Oral q1800  . sodium chloride flush  3 mL Intravenous Q12H  . sucralfate  1 g Oral TID WC & HS   Continuous Infusions: . sodium chloride 10 mL/hr (01/18/18 1812)  .  ceFAZolin (ANCEF) IV 2 g (01/20/18 0625)  . lactated ringers 10 mL/hr at  01/17/18 1115  . methocarbamol (ROBAXIN) IV       LOS: 5 days        Gimena Buick Gerome Apley, MD [facility] Pager [phone]

## 2018-01-20 NOTE — Progress Notes (Signed)
Patient ID: [name], male   DOB: [birth year], 62 y.o.   MRN: [id number] Patient is status post right transtibial amputation with a previous left transtibial amputation.  Patient states he is scheduled for his cardiac study either today or tomorrow.  Patient has the limb protector from [employer].  There is no drainage in the wound VAC canister.  Anticipate discharge to skilled nursing.  I am uncertain whether patient would be independent after a stay in inpatient rehab.

## 2018-01-20 NOTE — Progress Notes (Signed)
Inpatient Rehabilitation  Met with patient at bedside to discuss team's recommendation for IP Rehab.  Discussed program expectations, estimated length of stay, and anticipated outcomes.  Patient reports that he has thought about his options and discussed them with his MD and anticipates that he will require a longer rehab course; he is in favor of SNF.  Notified CSW.  Will sign off at this time.  Call if questions.    Carmelia Roller., CCC/SLP Admission Coordinator  [place]  Cell [phone]

## 2018-01-20 NOTE — Evaluation (Signed)
Occupational Therapy Evaluation Patient Details Name: [name] MRN: [id number] DOB: [birth year] Today's Date: 01/20/2018    History of Present Illness Pt is a 62 y/o male s/p R transtibial amputation. PMH including but not limited to L transtibial amputation in 2016, PVD, CAD, DM and HTN.   Clinical Impression   PTA Pt mod I for ADL and mobility with LLE Prosthetic and SPC PRN. Pt has limited wc access within home and does not have caregiver support available at dc. Pt is currently mod A for LB ADL (donning LLE prosthetic) and unable to achieve standing with 2 person assist today.  Min A +2 for AP transfer into recliner. OT will follow acutely, but feel that Pt will require SNF level placement at dc to allow for more time in rehab to maximize safety and independence prior to return home.     Follow Up Recommendations  SNF    Equipment Recommendations  3 in 1 bedside commode    Recommendations for Other Services       Precautions / Restrictions Precautions Precautions: Fall Precaution Comments: wound VAC Required Braces or Orthoses: Other Brace/Splint Other Brace/Splint: limb protector Restrictions Weight Bearing Restrictions: Yes RLE Weight Bearing: Non weight bearing      Mobility Bed Mobility Overal bed mobility: Needs Assistance Bed Mobility: Supine to Sit     Supine to sit: Min assist     General bed mobility comments: increased time and effort, use of bilateral UEs on bed rails, min A for trunk elevation to achieve long sitting in bed to don prosthesis  Transfers Overall transfer level: Needs assistance Equipment used: Rolling walker (2 wheeled) Transfers: Sit to/from Stand;Anterior-Posterior Transfer Sit to Stand: Max assist;+2 physical assistance;+2 safety/equipment     Anterior-Posterior transfers: [employer] safety/equipment   General transfer comment: Attempted sit <>stand from elevated EOB with LLE prosthetic on unable to achieve standing, Pt  able to perform his own AP transfer with min A +2 to recliner    Balance Overall balance assessment: Needs assistance Sitting-balance support: Bilateral upper extremity supported;Single extremity supported Sitting balance-Leahy Scale: Fair     Standing balance support: Bilateral upper extremity supported Standing balance-Leahy Scale: Zero Standing balance comment: unable to maintain standing at this time                           ADL either performed or assessed with clinical judgement   ADL Overall ADL's : Needs assistance/impaired Eating/Feeding: Modified independent;Sitting   Grooming: Supervision/safety;Set up;Sitting Grooming Details (indicate cue type and reason): able to perform all grooming tasks in chair when given needed items Upper Body Bathing: Min guard;Sitting   Lower Body Bathing: Moderate assistance   Upper Body Dressing : Set up;Sitting   Lower Body Dressing: Moderate assistance;Sitting/lateral leans Lower Body Dressing Details (indicate cue type and reason): able to don LLE prosthetic today with mod A from OT Toilet Transfer: Moderate assistance;+2 for safety/equipment;Anterior/posterior;BSC Toilet Transfer Details (indicate cue type and reason): simualted through recliner transfer, Pt with good use of BUE to AP transfer. Attempted sit <>stand with elevated bed and RW without success x2 Toileting- Clothing Manipulation and Hygiene: Minimal assistance;Bed level         General ADL Comments: Pt unable to achieve standing this session for rear peri care or transfers     Vision Patient Visual Report: No change from baseline       Perception     Praxis  Pertinent Vitals/Pain Pain Assessment: 0-10 Pain Score: 4  Pain Location: R residual limb at incision site and wound vac site Pain Descriptors / Indicators: Sore;Stabbing Pain Intervention(s): Monitored during session;Repositioned     Hand Dominance Right   Extremity/Trunk Assessment  Upper Extremity Assessment Upper Extremity Assessment: Overall WFL for tasks assessed   Lower Extremity Assessment Lower Extremity Assessment: Defer to PT evaluation RLE Deficits / Details: pt with decreased strength and ROM limitations secondary to post-op pain and weakness. Pt with good knee extension (to neutral), limited knee flexion (~45 degrees) with pain RLE: Unable to fully assess due to pain   Cervical / Trunk Assessment Cervical / Trunk Assessment: Normal   Communication Communication Communication: No difficulties   Cognition Arousal/Alertness: Awake/alert Behavior During Therapy: WFL for tasks assessed/performed Overall Cognitive Status: Within Functional Limits for tasks assessed                                     General Comments       Exercises     Shoulder Instructions      Home Living Family/patient expects to be discharged to:: Private residence Living Arrangements: Alone Available Help at Discharge: Family;Available PRN/intermittently Type of Home: Mobile home Home Access: Ramped entrance     Home Layout: One level     Bathroom Shower/Tub: Teacher, early years/pre: Standard Bathroom Accessibility: No   Home Equipment: Environmental consultant - 2 wheels;Wheelchair - manual;Cane - single point;Tub bench   Additional Comments: Pt's WC does not fit around his house, only in his living room and then he uses an office chair on wheels to push himself around in his kitchen      Prior Functioning/Environment Level of Independence: Independent with assistive device(s)        Comments: has a L LE prosthesis; used a cane PRN on uneven surfaces        OT Problem List: Decreased activity tolerance;Decreased range of motion;Impaired balance (sitting and/or standing);Decreased safety awareness;Decreased knowledge of use of DME or AE;Decreased knowledge of precautions;Pain      OT Treatment/Interventions: Self-care/ADL training;Therapeutic  exercise;DME and/or AE instruction;Therapeutic activities;Patient/family education;Balance training    OT Goals(Current goals can be found in the care plan section) Acute Rehab OT Goals Patient Stated Goal: to get to rehab to be as independent as possible OT Goal Formulation: With patient Time For Goal Achievement: 02/03/18 Potential to Achieve Goals: Good ADL Goals Pt Will Perform Lower Body Dressing: with modified independence;sitting/lateral leans Pt Will Transfer to Toilet: with modified independence;stand pivot transfer;bedside commode Pt Will Perform Toileting - Clothing Manipulation and hygiene: with modified independence;sitting/lateral leans Pt Will Perform Tub/Shower Transfer: Tub transfer;with modified independence;tub bench;rolling walker Additional ADL Goal #1: Pt will perform bed mobility at independent level prior to engaging in ADL activity  OT Frequency: Min 3X/week   Barriers to D/C: Decreased caregiver support  At this time, unable to provide 24 hour support at DC       Co-evaluation              AM-PAC PT "6 Clicks" Daily Activity     Outcome Measure Help from another person eating meals?: None Help from another person taking care of personal grooming?: A Little Help from another person toileting, which includes using toliet, bedpan, or urinal?: A Lot Help from another person bathing (including washing, rinsing, drying)?: A Lot Help from another person to put on  and taking off regular upper body clothing?: None Help from another person to put on and taking off regular lower body clothing?: A Lot 6 Click Score: 17   End of Session Equipment Utilized During Treatment: Gait belt;Rolling walker;Other (comment)(limb protector) Nurse Communication: Mobility status  Activity Tolerance: Patient tolerated treatment well Patient left: in chair;with call bell/phone within reach;Other (comment)(limb protector re-applied)  OT Visit Diagnosis: Other abnormalities of  gait and mobility (R26.89);Muscle weakness (generalized) (M62.81);Pain Pain - Right/Left: Right Pain - part of body: Leg                Time: 4239-5320 OT Time Calculation (min): 30 min Charges:  OT General Charges $OT Visit: 1 Visit OT Evaluation $OT Eval Moderate Complexity: 1 Mod OT Treatments $Self Care/Home Management : 8-22 mins  Hulda Humphrey OTR/L Acute Rehabilitation Services Pager: [phone] Office: [place] 01/20/2018, 10:52 AM

## 2018-01-21 ENCOUNTER — Inpatient Hospital Stay (HOSPITAL_COMMUNITY): Payer: BLUE CROSS/BLUE SHIELD

## 2018-01-21 ENCOUNTER — Inpatient Hospital Stay: Payer: Self-pay

## 2018-01-21 ENCOUNTER — Encounter (HOSPITAL_COMMUNITY): Payer: Self-pay | Admitting: *Deleted

## 2018-01-21 ENCOUNTER — Encounter (HOSPITAL_COMMUNITY)
Admission: EM | Disposition: A | Discharge status: Skilled Nursing Facility | Payer: Self-pay | Source: Home / Self Care | Attending: Internal Medicine

## 2018-01-21 DIAGNOSIS — R7881 Bacteremia: Secondary | ICD-10-CM

## 2018-01-21 DIAGNOSIS — I330 Acute and subacute infective endocarditis: Secondary | ICD-10-CM

## 2018-01-21 DIAGNOSIS — I079 Rheumatic tricuspid valve disease, unspecified: Secondary | ICD-10-CM

## 2018-01-21 DIAGNOSIS — R011 Cardiac murmur, unspecified: Secondary | ICD-10-CM

## 2018-01-21 HISTORY — PX: TEE WITHOUT CARDIOVERSION: SHX5443

## 2018-01-21 LAB — BASIC METABOLIC PANEL
ANION GAP: 9 (ref 5–15)
BUN: 17 mg/dL (ref 8–23)
CALCIUM: 8.3 mg/dL — AB (ref 8.9–10.3)
CO2: 24 mmol/L (ref 22–32)
Chloride: 102 mmol/L (ref 98–111)
Creatinine, Ser: 0.95 mg/dL (ref 0.61–1.24)
GFR calc Af Amer: >60 mL/min (ref >60)
GLUCOSE: 244 mg/dL — AB (ref 70–99)
Potassium: 4.3 mmol/L (ref 3.5–5.1)
Sodium: 135 mmol/L (ref 135–145)

## 2018-01-21 LAB — GLUCOSE, CAPILLARY
GLUCOSE-CAPILLARY: 229 mg/dL — AB (ref 70–99)
Glucose-Capillary: 206 mg/dL — ABNORMAL HIGH (ref 70–99)
Glucose-Capillary: 271 mg/dL — ABNORMAL HIGH (ref 70–99)
Glucose-Capillary: 304 mg/dL — ABNORMAL HIGH (ref 70–99)

## 2018-01-21 LAB — PROTIME-INR
INR: 1.18
Prothrombin Time: 15 seconds (ref 11.4–15.2)

## 2018-01-21 SURGERY — ECHOCARDIOGRAM, TRANSESOPHAGEAL
Anesthesia: Moderate Sedation

## 2018-01-21 MED ORDER — MIDAZOLAM HCL 5 MG/ML IJ SOLN
INTRAMUSCULAR | Status: AC
  Filled 2018-01-21: qty 2

## 2018-01-21 MED ORDER — INSULIN DETEMIR 100 UNIT/ML ~~LOC~~ SOLN
10.0000 [IU] | Freq: Every day | SUBCUTANEOUS | Status: DC
  Administered 2018-01-22: 10 [IU] via SUBCUTANEOUS
  Filled 2018-01-21 (×2): qty 0.1

## 2018-01-21 MED ORDER — FENTANYL CITRATE (PF) 100 MCG/2ML IJ SOLN
INTRAMUSCULAR | Status: AC
  Filled 2018-01-21: qty 2

## 2018-01-21 MED ORDER — BUTAMBEN-TETRACAINE-BENZOCAINE 2-2-14 % EX AERO
INHALATION_SPRAY | CUTANEOUS | Status: DC | PRN
  Administered 2018-01-21: 2 via TOPICAL

## 2018-01-21 MED ORDER — MIDAZOLAM HCL 5 MG/5ML IJ SOLN
INTRAMUSCULAR | Status: DC | PRN
  Administered 2018-01-21: 2 mg via INTRAVENOUS
  Administered 2018-01-21: 1 mg via INTRAVENOUS

## 2018-01-21 MED ORDER — INSULIN ASPART 100 UNIT/ML ~~LOC~~ SOLN
0.0000 [IU] | Freq: Three times a day (TID) | SUBCUTANEOUS | Status: DC
  Administered 2018-01-22: 5 [IU] via SUBCUTANEOUS
  Administered 2018-01-22 (×2): 3 [IU] via SUBCUTANEOUS

## 2018-01-21 MED ORDER — SODIUM CHLORIDE 0.9 % IV SOLN: INTRAVENOUS | Status: DC

## 2018-01-21 MED ORDER — FENTANYL CITRATE (PF) 100 MCG/2ML IJ SOLN
INTRAMUSCULAR | Status: DC | PRN
  Administered 2018-01-21: 25 ug via INTRAVENOUS

## 2018-01-21 NOTE — Progress Notes (Signed)
PROGRESS NOTE    [name]  RFF:[id number] DOB: 1956/01/26 DOA: 01/14/2018 PCP: [name], MD    Brief Narrative:  62 year old male who presented with malaise, vomiting, lower abdominal pain and right foot woundbleeding. He does have thesignificant past medical history for coronary artery disease,bioprostheticaortic valve replacement, type 2 diabetes mellitus, peripheral vascular disease status post left BKA and right TMA. He had generalized malaise, associated with nausea, vomiting and abdominal pain, positive bleeding on his right foot wound ulcer. On his initial physical examination he was tachypneic, tachycardic 120 bpm, hypotensive 90/60. Ill looking appearing, dry mucous membranes, lungs clear to auscultation, heart S1-S2 present, tachycardic, abdomen with generalized tenderness, right mid foot ulcerated wound, with surrounding edema and erythema. Sodium 135, potassium 3.8, chloride 100, bicarb 19, glucose 182, BUN 19, creatinine 1.84, white count 19.7, hemoglobin 15.7, hematocrit 47.7, platelets 124, venous lactic acid 6.2,urinalysis with 30 protein, specific gravity 1.045, urinary sodium less than 10.Head CT negative for acute changes. Chest radiograph with left lower lobe atelectasis.CT of the abdomen with peripheral wedge-shaped defect in the spleen consistent with splenic infarct.  Patient was admitted to the hospital withtheworking diagnosis of sepsis due to right foot cellulitis, complicated by acute kidney injury.   Assessment & Plan:   Principal Problem:   Staphylococcus aureus bacteremia Active Problems:   Insulin-requiring or dependent type II diabetes mellitus (HCC)   CAD (coronary artery disease)   Thrombocytopenia (HCC)   S/P BKA (below knee amputation) unilateral, right (HCC)   Splenic infarct   Renal insufficiency   Severe sepsis (HCC)   Right foot infection   Sepsis ([place])   Prosthetic valve endocarditis (HCC)   Staphylococcal  arthritis of right wrist (HCC)   Acute hematogenous osteomyelitis, right ankle and foot ([place])   History of prosthetic mitral valve   S/P bilateral BKA (below knee amputation) (HCC)   Post-operative pain   Benign essential HTN   Diabetes mellitus type 2 in obese (HCC)   PVD (peripheral vascular disease) ([place])     1. Sepsis due to rightdiabeticfoot osteomyelitis/MSSA bacteremia (in the setting on bioprosthetic aortic valve and splenic infarcts) complicated with tricuspid valve endocarditis.sp right transtibial amputation.IV cefazolin for antibiotic therapy, will order PICC line, plant to complete antibiotic therapy on 03-08-18.  Trans-esophageal echocardiogram showed aortic valve prosthesis functioning normally, but positive tricuspid valve vegetation.   2.Pre-renalAKI.Clinically has resolved. Renal function with serum cr at 0.95 today.   3. T2DMwith hypoglycemia. Fasting serum glucose up to 244 this am, capillary glucose 234, 273, 2229, 206 and 271. Will resume basal insulin regimen with 10 units of levimir and will continue glucose cover and monitoring with insulin sliding scale.   4. Coronary artery disease.Onaspirin and simvastatin per home regimen. Patient has been chest pain free.   5. Dysphagia.It has resolved with improvement of po intake, will continue antiacid therapy, pantoprazole and sucralfate.  6. HTN. On metoprolol, blood pressure 357 mmHg systolic.  DVT prophylaxis:heparin Code Status:full Family Communication:no family at the bedside  Disposition Plan/ discharge barriers:pending picc line and snf placement.  Consultants:  ID  Orthopedics  Procedures:    Antimicrobials: IV Cefazolin  Subjective: Patient is feeling well, but continue to be very weak and deconditioned, improved appetite and po intake, no nausea or vomiting.   Objective: Vitals:   01/21/18 0941 01/21/18 0950 01/21/18 1022 01/21/18 1515  BP: 139/61 (!)  144/67 (!) 142/75 134/61  Pulse: 93 89 92 83  Resp: 12 (!) 22 17   Temp: 98.6 F (  37 C)   98.5 F (36.9 C)  TempSrc: Oral   Oral  SpO2: 92% 92% 92% 90%  Weight:      Height:        Intake/Output Summary (Last 24 hours) at 01/21/2018 1537 Last data filed at 01/21/2018 0745 Gross per 24 hour  Intake 480 ml  Output 1575 ml  Net -1095 ml   Filed Weights   01/14/18 2012 01/15/18 0500 01/16/18 0500  Weight: 99.8 kg 96.1 kg 95.2 kg    Examination:   General: Not in pain or dyspnea, deconditioned  Neurology: Awake and alert, non focal  E DDU:KGUR pallor, no icterus, oral mucosa moist Cardiovascular: No JVD. S1-S2 present, rhythmic, no gallops, rubs, or murmurs. No lower extremity edema. Pulmonary: positive breath sounds bilaterally, adequate air movement, no wheezing, rhonchi or rales. Gastrointestinal. Abdomen with no organomegaly, non tender, no rebound or guarding Skin. No rashes Musculoskeletal: no joint deformities/ bilateral BKA     Data Reviewed: I have personally reviewed following labs and imaging studies  CBC: Recent Labs  Lab 01/14/18 1926 01/15/18 0246 01/16/18 0327 01/17/18 0355 01/19/18 0315  WBC 19.7* 23.1* 15.4* 12.7* 13.5*  NEUTROABS 17.9* 21.1*  --  10.5* 10.6*  HGB 15.7 12.1* 12.5* 12.6* 13.2  HCT 47.7 37.8* 37.8* 38.0* 40.8  MCV 88.0 89.6 87.1 87.0 86.8  PLT 124* 84* 80* 86* 427*   Basic Metabolic Panel: Recent Labs  Lab 01/15/18 0246 01/15/18 0602 01/16/18 0327 01/17/18 0355 01/21/18 0655  NA 136 138 138 139 135  K 4.4 4.7 3.6 4.7 4.3  CL 111 109 112* 110 102  CO2 15* 15* 19* 22 24  GLUCOSE 195* 210* 90 96 244*  BUN 24* 26* 34* 24* 17  CREATININE 1.78* 1.75* 1.27* 1.09 0.95  CALCIUM 7.1* 7.2* 7.4* 7.9* 8.3*  MG  --   --  2.0  --   --   PHOS  --   --  1.9*  --   --    GFR: Estimated Creatinine Clearance: 94.6 mL/min (by C-G formula based on SCr of 0.95 mg/dL). Liver Function Tests: Recent Labs  Lab 01/14/18 1926 01/15/18 0246    AST 36 32  ALT 20 17  ALKPHOS 78 47  BILITOT 1.4* 0.8  PROT 7.1 5.0*  ALBUMIN 3.8 2.6*   Recent Labs  Lab 01/14/18 1926  LIPASE 23   Recent Labs  Lab 01/14/18 1927  AMMONIA 36*   Coagulation Profile: Recent Labs  Lab 01/14/18 1926 01/15/18 0246 01/21/18 0655  INR 1.43 1.72 1.18   Cardiac Enzymes: No results for input(s): CKTOTAL, CKMB, CKMBINDEX, TROPONINI in the last 168 hours. BNP (last 3 results) No results for input(s): PROBNP in the last 8760 hours. HbA1C: No results for input(s): HGBA1C in the last 72 hours. CBG: Recent Labs  Lab 01/20/18 1145 01/20/18 1552 01/20/18 2043 01/21/18 0639 01/21/18 1141  GLUCAP 201* 234* 273* 229* 206*   Lipid Profile: No results for input(s): CHOL, HDL, LDLCALC, TRIG, CHOLHDL, LDLDIRECT in the last 72 hours. Thyroid Function Tests: No results for input(s): TSH, T4TOTAL, FREET4, T3FREE, THYROIDAB in the last 72 hours. Anemia Panel: No results for input(s): VITAMINB12, FOLATE, FERRITIN, TIBC, IRON, RETICCTPCT in the last 72 hours.    Radiology Studies: I have reviewed all of the imaging during this hospital visit personally     Scheduled Meds: . aspirin  325 mg Oral Daily  . calcium-vitamin D  1 tablet Oral BID  . heparin  5,000 Units Subcutaneous  Q8H  . Influenza vac split quadrivalent PF  0.5 mL Intramuscular Tomorrow-1000  . latanoprost  1 drop Both Eyes QHS  . metoprolol tartrate  50 mg Oral BID  . multivitamin with minerals  1 tablet Oral Daily  . pantoprazole  40 mg Oral Daily  . simvastatin  20 mg Oral q1800  . sodium chloride flush  3 mL Intravenous Q12H  . sucralfate  1 g Oral TID WC & HS   Continuous Infusions: . sodium chloride 10 mL/hr (01/18/18 1812)  .  ceFAZolin (ANCEF) IV 2 g (01/21/18 1519)  . lactated ringers Stopped (01/21/18 0956)  . methocarbamol (ROBAXIN) IV       LOS: 6 days        Raymond Cooperwood Gerome Apley, MD [facility] Pager [phone]'

## 2018-01-21 NOTE — CV Procedure (Signed)
Brief TEE note. Full report to follow in [place].  Bioprosthetic AVR appears well seated, no obvious abscess or vegetation. Trivial regurgitation seen.  There does appear to be an independently mobile echodensity on the tricuspid valve consistent with endocarditis.  During this procedure the patient is administered a total of Versed 3 mg and Fentanyl 25 mcg to achieve and maintain moderate conscious sedation.  The patient's heart rate, blood pressure, and oxygen saturation are monitored continuously during the procedure. The period of conscious sedation is 27 minutes, of which I was present face-to-face 100% of this time.  Buford Dresser, MD, PhD [facility]  [address] [place], [place] [address] [phone]

## 2018-01-21 NOTE — Progress Notes (Signed)
INFECTIOUS DISEASE PROGRESS NOTE  ID: [name] is a 62 y.o. male with  Principal Problem:   Staphylococcus aureus bacteremia Active Problems:   Insulin-requiring or dependent type II diabetes mellitus (HCC)   CAD (coronary artery disease)   Thrombocytopenia (HCC)   S/P BKA (below knee amputation) unilateral, right (HCC)   Splenic infarct   Renal insufficiency   Severe sepsis (HCC)   Right foot infection   Sepsis ([place])   Prosthetic valve endocarditis (HCC)   Staphylococcal arthritis of right wrist (HCC)   Acute hematogenous osteomyelitis, right ankle and foot ([place])   History of prosthetic mitral valve   S/P bilateral BKA (below knee amputation) (HCC)   Post-operative pain   Benign essential HTN   Diabetes mellitus type 2 in obese (HCC)   PVD (peripheral vascular disease) (HCC)  Subjective: No complaints.   Abtx:  Anti-infectives (From admission, onward)   Start     Dose/Rate Route Frequency Ordered Stop   01/16/18 1400  vancomycin (VANCOCIN) IVPB 750 mg/150 ml premix  Status:  Discontinued     750 mg 150 mL/hr over 60 Minutes Intravenous Every 12 hours 01/15/18 0039 01/15/18 1136   01/15/18 2200  ceFAZolin (ANCEF) IVPB 2g/100 mL premix     2 g 200 mL/hr over 30 Minutes Intravenous Every 8 hours 01/15/18 1136     01/15/18 0130  vancomycin (VANCOCIN) 1,500 mg in sodium chloride 0.9 % 500 mL IVPB     1,500 mg 250 mL/hr over 120 Minutes Intravenous  Once 01/15/18 0039 01/15/18 0357   01/15/18 0100  ceFEPIme (MAXIPIME) 2 g in sodium chloride 0.9 % 100 mL IVPB  Status:  Discontinued     2 g 200 mL/hr over 30 Minutes Intravenous Every 24 hours 01/15/18 0039 01/15/18 1136   01/14/18 2030  cefTRIAXone (ROCEPHIN) 2 g in sodium chloride 0.9 % 100 mL IVPB  Status:  Discontinued     2 g 200 mL/hr over 30 Minutes Intravenous Every 24 hours 01/14/18 2017 01/15/18 0004   01/14/18 2030  metroNIDAZOLE (FLAGYL) IVPB 500 mg  Status:  Discontinued     500 mg 100 mL/hr over 60  Minutes Intravenous Every 8 hours 01/14/18 2017 01/15/18 1140      Medications:  Scheduled: . aspirin  325 mg Oral Daily  . calcium-vitamin D  1 tablet Oral BID  . heparin  5,000 Units Subcutaneous Q8H  . Influenza vac split quadrivalent PF  0.5 mL Intramuscular Tomorrow-1000  . latanoprost  1 drop Both Eyes QHS  . metoprolol tartrate  50 mg Oral BID  . multivitamin with minerals  1 tablet Oral Daily  . pantoprazole  40 mg Oral Daily  . simvastatin  20 mg Oral q1800  . sodium chloride flush  3 mL Intravenous Q12H  . sucralfate  1 g Oral TID WC & HS    Objective: Vital signs in last 24 hours: Temp:  [98.3 F (36.8 C)-98.6 F (37 C)] 98.6 F (37 C) (09/17 0941) Pulse Rate:  [88-95] 89 (09/17 0950) Resp:  [10-26] 22 (09/17 0950) BP: (120-164)/(47-77) 144/67 (09/17 0950) SpO2:  [91 %-98 %] 92 % (09/17 0950)   General appearance: no distress Resp: clear to auscultation bilaterally Cardio: regular rate and rhythm and systolic murmur: systolic ejection 3/6, crescendo at 2nd left intercostal space GI: normal findings: bowel sounds normal and soft, non-tender Extremities: RLE wrapped.   Lab Results Recent Labs    01/19/18 0315 01/21/18 0655  WBC 13.5*  --  HGB 13.2  --   HCT 40.8  --   NA  --  135  K  --  4.3  CL  --  102  CO2  --  24  BUN  --  17  CREATININE  --  0.95   Liver Panel No results for input(s): PROT, ALBUMIN, AST, ALT, ALKPHOS, BILITOT, BILIDIR, IBILI in the last 72 hours. Sedimentation Rate No results for input(s): ESRSEDRATE in the last 72 hours. C-Reactive Protein No results for input(s): CRP in the last 72 hours.  Microbiology: Recent Results (from the past 240 hour(s))  Blood Culture (routine x 2)     Status: Abnormal   Collection Time: 01/14/18  8:10 PM  Result Value Ref Range Status   Specimen Description BLOOD RIGHT ANTECUBITAL  Final   Special Requests   Final    BOTTLES DRAWN AEROBIC AND ANAEROBIC Blood Culture adequate volume    Culture  Setup Time   Final    GRAM POSITIVE COCCI IN BOTH AEROBIC AND ANAEROBIC BOTTLES CRITICAL RESULT CALLED TO, READ BACK BY AND VERIFIED WITH: Ellin Mayhew [facility] [phone] MLM Performed at [facility], [place] [address], [place], [place] [address]    Culture STAPHYLOCOCCUS AUREUS (A)  Final   Report Status 01/17/2018 FINAL  Final   Organism ID, Bacteria STAPHYLOCOCCUS AUREUS  Final      Susceptibility   Staphylococcus aureus - MIC*    CIPROFLOXACIN <=0.5 SENSITIVE Sensitive     ERYTHROMYCIN <=0.25 SENSITIVE Sensitive     GENTAMICIN <=0.5 SENSITIVE Sensitive     OXACILLIN 0.5 SENSITIVE Sensitive     TETRACYCLINE <=1 SENSITIVE Sensitive     VANCOMYCIN 1 SENSITIVE Sensitive     TRIMETH/SULFA <=10 SENSITIVE Sensitive     CLINDAMYCIN <=0.25 SENSITIVE Sensitive     RIFAMPIN <=0.5 SENSITIVE Sensitive     Inducible Clindamycin NEGATIVE Sensitive     * STAPHYLOCOCCUS AUREUS  Blood Culture ID Panel (Reflexed)     Status: Abnormal   Collection Time: 01/14/18  8:10 PM  Result Value Ref Range Status   Enterococcus species NOT DETECTED NOT DETECTED Final   Listeria monocytogenes NOT DETECTED NOT DETECTED Final   Staphylococcus species DETECTED (A) NOT DETECTED Final    Comment: CRITICAL RESULT CALLED TO, READ BACK BY AND VERIFIED WITH: PHARMD M PHAM [phone] MLM    Staphylococcus aureus DETECTED (A) NOT DETECTED Final    Comment: Methicillin (oxacillin) susceptible Staphylococcus aureus (MSSA). Preferred therapy is anti staphylococcal beta lactam antibiotic (Cefazolin or Nafcillin), unless clinically contraindicated. CRITICAL RESULT CALLED TO, READ BACK BY AND VERIFIED WITH: PHARMD M PHAM [phone] MLM    Methicillin resistance NOT DETECTED NOT DETECTED Final   Streptococcus species NOT DETECTED NOT DETECTED Final   Streptococcus agalactiae NOT DETECTED NOT DETECTED Final   Streptococcus pneumoniae NOT DETECTED NOT DETECTED Final   Streptococcus pyogenes NOT DETECTED NOT DETECTED  Final   Acinetobacter baumannii NOT DETECTED NOT DETECTED Final   Enterobacteriaceae species NOT DETECTED NOT DETECTED Final   Enterobacter cloacae complex NOT DETECTED NOT DETECTED Final   Escherichia coli NOT DETECTED NOT DETECTED Final   Klebsiella oxytoca NOT DETECTED NOT DETECTED Final   Klebsiella pneumoniae NOT DETECTED NOT DETECTED Final   Proteus species NOT DETECTED NOT DETECTED Final   Serratia marcescens NOT DETECTED NOT DETECTED Final   Haemophilus influenzae NOT DETECTED NOT DETECTED Final   Neisseria meningitidis NOT DETECTED NOT DETECTED Final   Pseudomonas aeruginosa NOT DETECTED NOT DETECTED Final  Candida albicans NOT DETECTED NOT DETECTED Final   Candida glabrata NOT DETECTED NOT DETECTED Final   Candida krusei NOT DETECTED NOT DETECTED Final   Candida parapsilosis NOT DETECTED NOT DETECTED Final   Candida tropicalis NOT DETECTED NOT DETECTED Final    Comment: Performed at [facility], [place] [address] [place], [place] [address]  Blood Culture (routine x 2)     Status: Abnormal   Collection Time: 01/14/18  8:20 PM  Result Value Ref Range Status   Specimen Description BLOOD RIGHT FOREARM  Final   Special Requests   Final    BOTTLES DRAWN AEROBIC AND ANAEROBIC Blood Culture adequate volume   Culture  Setup Time   Final    GRAM POSITIVE COCCI IN BOTH AEROBIC AND ANAEROBIC BOTTLES CRITICAL VALUE NOTED.  VALUE IS CONSISTENT WITH PREVIOUSLY REPORTED AND CALLED VALUE.    Culture (A)  Final    STAPHYLOCOCCUS AUREUS SUSCEPTIBILITIES PERFORMED ON PREVIOUS CULTURE WITHIN THE LAST 5 DAYS. Performed at [facility], [place] [address] [place], [place] [address]    Report Status 01/17/2018 FINAL  Final  Urine culture     Status: Abnormal   Collection Time: 01/15/18  3:02 AM  Result Value Ref Range Status   Specimen Description URINE, CATHETERIZED  Final   Special Requests NONE  Final   Culture (A)  Final    <10,000 COLONIES/mL INSIGNIFICANT GROWTH Performed  at [facility], [place] [address] [place], [place] [address]    Report Status 01/16/2018 FINAL  Final  MRSA PCR Screening     Status: None   Collection Time: 01/15/18  4:41 AM  Result Value Ref Range Status   MRSA by PCR NEGATIVE NEGATIVE Final    Comment:        The GeneXpert MRSA Assay (FDA approved for NASAL specimens only), is one component of a comprehensive MRSA colonization surveillance program. It is not intended to diagnose MRSA infection nor to guide or monitor treatment for MRSA infections. Performed at [facility], [place] [address] [place], [place] [address]   Culture, blood (Routine X 2) w Reflex to ID Panel     Status: None   Collection Time: 01/15/18 12:15 PM  Result Value Ref Range Status   Specimen Description BLOOD RIGHT ANTECUBITAL  Final   Special Requests   Final    BOTTLES DRAWN AEROBIC ONLY Blood Culture results may not be optimal due to an inadequate volume of blood received in culture bottles   Culture   Final    NO GROWTH 5 DAYS Performed at [facility], [place] [address] [place], [place] [address]    Report Status 01/20/2018 FINAL  Final  Culture, blood (Routine X 2) w Reflex to ID Panel     Status: None   Collection Time: 01/15/18 12:21 PM  Result Value Ref Range Status   Specimen Description BLOOD RIGHT ARM  Final   Special Requests   Final    BOTTLES DRAWN AEROBIC ONLY Blood Culture results may not be optimal due to an inadequate volume of blood received in culture bottles   Culture   Final    NO GROWTH 5 DAYS Performed at [facility], [place] [address] [place], [place] [address]    Report Status 01/20/2018 FINAL  Final    Studies/Results: No results found.   Assessment/Plan: MSSA bacteremia Endocarditis, TV Splenic infarct R BKA  Total days of antibiotics: 8 (ancef)  BCx (-)  on 9-11, will use as date to count his anbx from.    Allergies  Allergen Reactions  . Ace Inhibitors Cough    OPAT  Orders Discharge antibiotics: ancef 2g ivpb q8h  End Date: 03-08-18  [facility] Care Per Protocol: please  Labs weekly while on IV antibiotics: _x_ CBC with differential __ BMP _x_ CMP __ CRP __ ESR __ Vancomycin trough  _x_ Please pull PIC at completion of IV antibiotics __ Please leave PIC in place until doctor has seen patient or been notified  Fax weekly labs to [phone]  Clinic Follow Up Appt: 4-6 weeks ID         Bobby Rumpf MD, FACP Infectious Diseases (pager) [phone] [email or web address] 01/21/2018, 10:09 AM  LOS: 6 days

## 2018-01-21 NOTE — Progress Notes (Signed)
PHARMACY CONSULT NOTE FOR:  OUTPATIENT  PARENTERAL ANTIBIOTIC THERAPY (OPAT)  Indication: MSSA bacteremia/endocarditis Regimen: Cefazolin 2 gm every 8 hours End date: 03/08/18  IV antibiotic discharge orders are pended. To discharging provider:  please sign these orders via discharge navigator,  Select New Orders & click on the button choice - Manage This Unsigned Work.     Thank you for allowing pharmacy to be a part of this patient's care.  Susa Raring, PharmD, BCPS Infectious Diseases Clinical Pharmacist Phone: [phone] 01/21/2018, 10:12 AM

## 2018-01-21 NOTE — Progress Notes (Addendum)
  Echocardiogram Echocardiogram Transesophageal has been performed.  Ohn Bostic L Androw 01/21/2018, 9:42 AM

## 2018-01-21 NOTE — Progress Notes (Signed)
Pharmacy Antibiotic Note  [name] is a 62 y.o. male admitted on 01/14/2018 with MSSA bacteremia. Pharmacy has been consulted for cefazolin dosing. WBC down to 12.7. Scr improved to 0.95 (BL ~1.2), estimated CrCl >80 mL/min.  Plan: Continue cefazolin 2g IV q8h F/u clinical status, repeat Cx, TEE, renal function Outpatient abx orders completed  Height: 5\' 10"  (177.8 cm) Weight: 209 lb 14.1 oz (95.2 kg) IBW/kg (Calculated) : 73  Temp (24hrs), Avg:98.5 F (36.9 C), Min:98.3 F (36.8 C), Max:98.6 F (37 C)  Recent Labs  Lab 01/14/18 1926 01/14/18 1933 01/14/18 2027 01/15/18 0245 01/15/18 0246 01/15/18 0602 01/16/18 0327 01/17/18 0355 01/19/18 0315 01/21/18 0655  WBC 19.7*  --   --   --  23.1*  --  15.4* 12.7* 13.5*  --   CREATININE 1.84*  --   --   --  1.78* 1.75* 1.27* 1.09  --  0.95  LATICACIDVEN  --  6.24* 5.45* 3.0*  --  2.4*  --   --   --   --     Estimated Creatinine Clearance: 94.6 mL/min (by C-G formula based on SCr of 0.95 mg/dL).    Allergies  Allergen Reactions  . Ace Inhibitors Cough   Antimicrobials this admission: CTX 9/10 x1 Vanc 9/10 >> 9/11 Cefepime 9/10 >> 9/11 Flagyl 9/10 >> 9/11 Cefazolin 9/11 >>plan 6 weeks (ends 11/2)  Microbiology results: 9/10 bcx - MSSA (3/4) 9/11 urine - insign growth 9/11 bcx - negative 9/11 MRSA PCR - negative  Thank you for allowing pharmacy to be a part of this patient's care.  Sherlon Handing, PharmD, BCPS Clinical pharmacist  **Pharmacist phone directory can now be found on [email or web address] ([email or web address]).  Listed under [place]. 01/21/2018 2:02 PM

## 2018-01-21 NOTE — Interval H&P Note (Signed)
History and Physical Interval Note:  01/21/2018 8:45 AM  [name]  has presented today for surgery, with the diagnosis of bacteremia  The various methods of treatment have been discussed with the patient and family. After consideration of risks, benefits and other options for treatment, the patient has consented to  Procedure(s): TRANSESOPHAGEAL ECHOCARDIOGRAM (TEE) (N/A) as a surgical intervention .  The patient's history has been reviewed, patient examined, no change in status, stable for surgery.  I have reviewed the patient's chart and labs.  Questions were answered to the patient's satisfaction.     Lorien Shingler Harrell Gave

## 2018-01-21 NOTE — Progress Notes (Signed)
Inpatient Diabetes Program Recommendations  AACE/ADA: New Consensus Statement on Inpatient Glycemic Control (2015)  Target Ranges:  Prepandial:   less than 140 mg/dL      Peak postprandial:   less than 180 mg/dL (1-2 hours)      Critically ill patients:  140 - 180 mg/dL   Results for JAVARI, BUFKIN (MRN [id number]) as of 01/21/2018 10:17  Ref. Range 01/20/2018 06:43 01/20/2018 11:45 01/20/2018 15:52 01/20/2018 20:43 01/21/2018 06:39  Glucose-Capillary Latest Ref Range: 70 - 99 mg/dL 170 (H) 201 (H) 234 (H) 273 (H) 229 (H)   Review of Glycemic Control  Diabetes history: DM 2 Outpatient Diabetes medications: Trulicity 1.5 mg Weekly, Farxiga 10 mg Daily, Glipizide 10 mg BID, Toujeo 65 units Current orders for Inpatient glycemic control: None  Inpatient Diabetes Program Recommendations:    Glucose trends in the 200 range. Please consider Novolog 0-15 units tid Correction scale while here.   Thanks,  Tama Headings RN, MSN, BC-ADM Inpatient Diabetes Coordinator Team Pager [phone] (8a-5p)

## 2018-01-21 NOTE — Clinical Social Work Note (Signed)
Clinical Social Work Assessment  Patient Details  Name: [name] MRN: [id number] Date of Birth: 1956-02-12  Date of referral:  01/21/18               Reason for consult:  Facility Placement                Permission sought to share information with:  Family Supports, Customer service manager Permission granted to share information::  Yes, Verbal Permission Granted  Name::     Catha Gosselin   Agency::  SNFs  Relationship::  Niece   Contact Information:  [phone]  Housing/Transportation Living arrangements for the past 2 months:  [place] of Information:  Patient Patient Interpreter Needed:  None Criminal Activity/Legal Involvement Pertinent to Current Situation/Hospitalization:  No - Comment as needed Significant Relationships:  Other Family Members Lives with:  Self Do you feel safe going back to the place where you live?  No Need for family participation in patient care:  No (Coment)  Care giving concerns:  CSW received consult for discharge needs. CSW spoke with patient regarding PT recommendation of SNF placement at time of discharge. Patient states he lives alone.   Social Worker assessment / plan:  CSW spoke with patient concerning possibility of rehab at [facility] before returning home.   Employment status:  Disabled (Comment on whether or not currently receiving Disability) Insurance information:  Medicare PT Recommendations:  [place] / Referral to community resources:  [place]  Patient/Family's Response to care:  Patient recognizes need for rehab before returning home and is agreeable to a SNF placement . Patient requested CSW contact his niece, [name]  for preferred SNF placement. CSW will provide bed offers as soon as available and they notify CSW once selection has been made.  Patient/Family's Understanding of and Emotional Response to Diagnosis, Current Treatment, and Prognosis:   Patient and family  is realistic regarding therapy needs and expressed being hopeful for SNF placement. Patient expressed understanding of CSW role and discharge process as well as medical condition. No questions/concerns about plan or treatment.   Emotional Assessment Appearance:  Appears stated age Attitude/Demeanor/Rapport:  Engaged Affect (typically observed):  Accepting, Overwhelmed, Pleasant, Appropriate Orientation:  Oriented to Self, Oriented to Place, Oriented to  Time, Oriented to Situation Alcohol / Substance use:  Not Applicable Psych involvement (Current and /or in the community):  No (Comment)  Discharge Needs  Concerns to be addressed:  Basic Needs Readmission within the last 30 days:  No Current discharge risk:  Lives alone, Dependent with Mobility Barriers to Discharge:  Continued Medical Work up   [employer], [place] 01/21/2018, 12:01 PM

## 2018-01-22 LAB — GLUCOSE, CAPILLARY
GLUCOSE-CAPILLARY: 277 mg/dL — AB (ref 70–99)
GLUCOSE-CAPILLARY: 229 mg/dL — AB (ref 70–99)
Glucose-Capillary: 250 mg/dL — ABNORMAL HIGH (ref 70–99)

## 2018-01-22 LAB — CBC WITH DIFFERENTIAL/PLATELET
Abs Immature Granulocytes: 0.3 10*3/uL — ABNORMAL HIGH (ref 0.0–0.1)
BASOS ABS: 0 10*3/uL (ref 0.0–0.1)
Basophils Relative: 0 %
EOS ABS: 0.2 10*3/uL (ref 0.0–0.7)
EOS PCT: 1 %
HCT: 36.7 % — ABNORMAL LOW (ref 39.0–52.0)
Hemoglobin: 12.1 g/dL — ABNORMAL LOW (ref 13.0–17.0)
Immature Granulocytes: 2 %
Lymphocytes Relative: 9 %
Lymphs Abs: 1.2 10*3/uL (ref 0.7–4.0)
MCH: 28.5 pg (ref 26.0–34.0)
MCHC: 33 g/dL (ref 30.0–36.0)
MCV: 86.4 fL (ref 78.0–100.0)
Monocytes Absolute: 1.4 10*3/uL — ABNORMAL HIGH (ref 0.1–1.0)
Monocytes Relative: 10 %
Neutro Abs: 11 10*3/uL — ABNORMAL HIGH (ref 1.7–7.7)
Neutrophils Relative %: 78 %
Platelets: 164 10*3/uL (ref 150–400)
RBC: 4.25 MIL/uL (ref 4.22–5.81)
RDW: 13.4 % (ref 11.5–15.5)
WBC: 14.1 10*3/uL — AB (ref 4.0–10.5)

## 2018-01-22 LAB — BASIC METABOLIC PANEL
Anion gap: 8 (ref 5–15)
BUN: 21 mg/dL (ref 8–23)
CALCIUM: 8.3 mg/dL — AB (ref 8.9–10.3)
CO2: 25 mmol/L (ref 22–32)
CREATININE: 1.13 mg/dL (ref 0.61–1.24)
Chloride: 100 mmol/L (ref 98–111)
GFR calc non Af Amer: >60 mL/min (ref >60)
Glucose, Bld: 287 mg/dL — ABNORMAL HIGH (ref 70–99)
Potassium: 4.3 mmol/L (ref 3.5–5.1)
SODIUM: 133 mmol/L — AB (ref 135–145)

## 2018-01-22 MED ORDER — CEFAZOLIN IV (FOR PTA / DISCHARGE USE ONLY): 2.0000 g | Freq: Three times a day (TID) | INTRAVENOUS | 0 refills | Status: DC

## 2018-01-22 MED ORDER — POLYETHYLENE GLYCOL 3350 17 G PO PACK: 17.0000 g | PACK | Freq: Every day | ORAL | 0 refills | Status: DC | PRN

## 2018-01-22 MED ORDER — OXYCODONE HCL 5 MG PO TABS: 5.0000 mg | ORAL_TABLET | ORAL | 0 refills | Status: DC | PRN

## 2018-01-22 MED ORDER — INSULIN ASPART 100 UNIT/ML ~~LOC~~ SOLN: SUBCUTANEOUS | 0 refills | Status: DC

## 2018-01-22 MED ORDER — HEPARIN SOD (PORK) LOCK FLUSH 100 UNIT/ML IV SOLN
250.0000 [IU] | INTRAVENOUS | Status: AC | PRN
  Administered 2018-01-22: 250 [IU]

## 2018-01-22 MED ORDER — SODIUM CHLORIDE 0.9% FLUSH: 10.0000 mL | INTRAVENOUS | Status: DC | PRN

## 2018-01-22 MED ORDER — TOUJEO SOLOSTAR 300 UNIT/ML ~~LOC~~ SOPN: 30.0000 [IU] | PEN_INJECTOR | SUBCUTANEOUS | 0 refills | Status: DC

## 2018-01-22 NOTE — Progress Notes (Signed)
Peripherally Inserted Central Catheter/Midline Placement  The IV Nurse has discussed with the patient and/or persons authorized to consent for the patient, the purpose of this procedure and the potential benefits and risks involved with this procedure.  The benefits include less needle sticks, lab draws from the catheter, and the patient may be discharged home with the catheter. Risks include, but not limited to, infection, bleeding, blood clot (thrombus formation), and puncture of an artery; nerve damage and irregular heartbeat and possibility to perform a PICC exchange if needed/ordered by physician.  Alternatives to this procedure were also discussed.  Bard Power PICC patient education guide, fact sheet on infection prevention and patient information card has been provided to patient /or left at bedside.    PICC/Midline Placement Documentation  PICC Single Lumen 80/03/49 PICC Right Basilic 43 cm 0 cm (Active)  Indication for Insertion or Continuance of Line Prolonged intravenous therapies 01/22/2018  9:30 AM  Exposed Catheter (cm) 0 cm 01/22/2018  9:30 AM  Site Assessment Clean;Dry;Intact 01/22/2018  9:30 AM  Line Status Flushed;Blood return noted 01/22/2018  9:30 AM  Dressing Type Transparent 01/22/2018  9:30 AM  Dressing Status Clean;Dry;Intact;Antimicrobial disc in place 01/22/2018  9:30 AM  Dressing Intervention New dressing 01/22/2018  9:30 AM  Dressing Change Due 01/29/18 01/22/2018  9:30 AM       [name] 01/22/2018, 9:31 AM

## 2018-01-22 NOTE — Plan of Care (Signed)
Problem: Health Behavior/Discharge Planning: Goal: Ability to manage health-related needs will improve Outcome: Progressing   Problem: Clinical Measurements: Goal: Respiratory complications will improve Outcome: Progressing   Problem: Activity: Goal: Risk for activity intolerance will decrease Outcome: Progressing   Problem: Skin Integrity: Goal: Risk for impaired skin integrity will decrease Outcome: Progressing   Problem: Education: Goal: Knowledge of the prescribed therapeutic regimen will improve Outcome: Progressing   Problem: Self-Care: Goal: Ability to meet self-care needs will improve Outcome: Progressing

## 2018-01-22 NOTE — Discharge Summary (Addendum)
Discharge Summary  [name] OZD:[id number] DOB: [birth year]  PCP: Raymond General, MD  Admit date: 01/14/2018 Discharge date: 01/22/2018  Time spent: 61mns, more than 50% time spent on coordination of care  Recommendations for Outpatient Follow-up:  1. F/u with SNF MD  for hospital discharge follow up, repeat cbc/bmp at follow up 2. F/u with ortho Dr [name] week for wound vac management 3. F/u with infectious disease in 4 weeks 4. F/u with cardiology Dr Raymond Spanishfor h/o aortic valve placement, now with tricuspid valve endocarditis   Discharge Diagnoses:  Active Hospital Problems   Diagnosis Date Noted  . Staphylococcus aureus bacteremia   . S/P bilateral BKA (below knee amputation) ([facility] [place]   . Post-operative pain   . Benign essential HTN   . Diabetes mellitus type 2 in obese ([facility]   . PVD (peripheral vascular disease) ([place]   . History of prosthetic mitral valve   . Right foot infection 01/15/2018  . Sepsis ([facility] 01/15/2018  . Prosthetic valve endocarditis ([facility]   . Staphylococcal arthritis of right wrist ([facility]   . Acute hematogenous osteomyelitis, right ankle and foot ([facility]   . Splenic infarct 01/14/2018  . Renal insufficiency 01/14/2018  . Severe sepsis ([facility] 01/14/2018  . S/P BKA (below knee amputation) unilateral, right (HW[place] 12/08/2014  . Thrombocytopenia ([facility] 07/28/2014  . Insulin-requiring or dependent type II diabetes mellitus (HS[facility] [place] 04/19/2009  . CAD (coronary artery disease) 04/19/2009    Resolved Hospital Problems  No resolved problems to display.    Discharge Condition: stable  Diet recommendation: heart healthy/carb modified  Filed Weights   01/14/18 2012 01/15/18 0500 01/16/18 0500  Weight: 99.8 kg 96.1 kg 95.2 kg    History of present illness: (per admitting MD Dr OMyna Hidalgoon 9/11) PCP: SShepard General MD   Patient coming from: Home   Chief Complaint: Malaise, vomiting, lower abdominal pain, right foot wound   HPI: Raymond LEyan Hagoodis a 62y.o. male with medical history significant for coronary artery disease, aortic valve replacement, insulin-dependent diabetes mellitus, and peripheral vascular disease status post left BKA and right TMA, now presenting to the emergency department for evaluation of King malaise, loss of appetite, lower abdominal discomfort, and nonbloody vomiting.  Patient reports that he been in his usual state until the day prior to presentation when the aforementioned symptoms developed.  He reports chills.  Denies diarrhea, melena, or hematochezia.  Reports cramping discomfort in the lower abdomen and nausea with nonbloody vomiting.  He denies shortness of breath or chest pain.  He has had a mild headache associated with this, but denies focal numbness or weakness.  Family noted bleeding from a right foot wound earlier today.  Patient states that he does not have sensation in the right foot.  ED Course: Upon arrival to the ED, patient is found to be afebrile, saturating adequately on room air, mildly tachypneic, tachycardic in the 120s, and with blood pressure 90/60.  EKG features sinus tachycardia with rate 126 and LVH with repolarization abnormality.  Chest x-ray is notable for stable cardiomegaly and atelectasis.  Noncontrast head CT is negative for acute intracranial abnormality.  CT the abdomen and pelvis is notable for peripheral wedge-shaped defect in the spleen consistent with infarct, as well as symmetric bilateral perinephric edema, possibly chronic or infectious.  Chemistry panel is notable for creatinine 1.84, up from 1.13 in 2016.  CBC is notable for leukocytosis to 19,700 with increased bands.  Lactic acid is elevated to 6.24.  Blood  cultures were collected in the ED, 30 cc/kg NS bolus was given, and the patient was treated with Rocephin and Flagyl.  Urinalysis was ordered but not yet collected.  SBP remains in the 90s, tachycardia has improved, and the patient will be admitted to the stepdown  unit for ongoing evaluation and management of severe sepsis most likely secondary to urinary source or right foot infection.   Hospital Course:  Principal Problem:   Staphylococcus aureus bacteremia Active Problems:   Insulin-requiring or dependent type II diabetes mellitus (HCC)   CAD (coronary artery disease)   Thrombocytopenia (HCC)   S/P BKA (below knee amputation) unilateral, right (HCC)   Splenic infarct   Renal insufficiency   Severe sepsis (HCC)   Right foot infection   Sepsis ([place])   Prosthetic valve endocarditis (HCC)   Staphylococcal arthritis of right wrist (HCC)   Acute hematogenous osteomyelitis, right ankle and foot ([place])   History of prosthetic mitral valve   S/P bilateral BKA (below knee amputation) (HCC)   Post-operative pain   Benign essential HTN   Diabetes mellitus type 2 in obese (HCC)   PVD (peripheral vascular disease) ([place])   1. Sepsis due to rightdiabeticfoot osteomyelitis/MSSA bacteremia (in the setting on bioprosthetic aortic valve and splenic infarcts) complicated with tricuspid valve endocarditis.sp right transtibial amputation. -Trans-esophageal echocardiogram showed aortic valve prosthesis functioning normally, but positive tricuspid valve vegetation.  repeat blood culture no growth -IV cefazolin for antibiotic therapy,  PICC placed on 9/17, plant to complete antibiotic therapy on 03-08-18.  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 bun/cr on presentation was 24/1.78 Clinically has resolved. Bun/cr 21/1.13 at discharge.  Home meds losartan held in the hospital, resumed at discharge  3. Insulin dependent T2DMwith hypoglycemia. Had hypoglycemia in the hospital a1c 8.5 -he reports at home he uses toujeo 65-100units daily, in the hospital, he only needed levemir 10units qhs and  ssi, not sure if meds or diet noncompliance.  -he is discharged on reduced does of toujeo 30units daily, and ssi -snf md to continue adjust insulin does -home meds farxiga discontinued in the setting of amputation, other home meds flipizide, tanzeum,  trulicity resumed at discharge.   4. Coronary artery disease.h/o aortic valve replacement Onaspirin, simvastatin, betablocker per home regimen.Patient has been chest pain free.  He is to follow up with cardiology  5. Dysphagia.It has resolved with improvement of po intake, will continueantiacid therapy, pantoprazole.  6. HTN.continue home meds metoprolol and losartan.  DVT prophylaxis:heparin Code Status:full Family Communication:no family at the bedside  Disposition Plan/ discharge barriers:snf placement.  Consultants:  ID  Orthopedics  Cardiology for TEE    Procedures:  TEE on 9/17  Right BKA with wound vac placement on 9/13  picc line placement on 9/17    Discharge Exam: BP (!) 156/78   Pulse 86   Temp 98.1 F (36.7 C) (Oral)   Resp 19   Ht _0  (1.778 m)   Wt 95.2 kg   SpO2 94%   BMI 30.11 kg/m   King: NAD Cardiovascular: RRR Respiratory: CTABL Extremity: right leg s/p right BKA, dressing intact, wound vac on, h/o left BKA  Discharge Instructions You were cared for by a hospitalist during your hospital stay. If you have any questions about your discharge medications or the care you received while you were in the  hospital after you are discharged, you can call the unit and asked to speak with the hospitalist on call if the hospitalist that took care of you is not available. Once you are discharged, your primary care physician will handle any further medical issues. Please note that NO REFILLS for any discharge medications will be authorized once you are discharged, as it is imperative that you return to your primary care physician (or establish a relationship with a primary care  physician if you do not have one) for your aftercare needs so that they can reassess your need for medications and monitor your lab values.  Discharge Instructions    Ambulatory referral to Physical Medicine Rehab   Complete by:  As directed    1 month hospital follow up s/p right BKA with hx of left BKA   Diet - low sodium heart healthy   Complete by:  As directed    Carb modified diet   Home infusion instructions [facility] May follow [place] Dosing Protocol; May administer Cathflo as needed to maintain patency of vascular access device.; Flushing of vascular access device: per [facility] Protocol: 0.9% NaCl pre/post medica...   Complete by:  As directed    Instructions:  May follow [place] Dosing Protocol   Instructions:  May administer Cathflo as needed to maintain patency of vascular access device.   Instructions:  Flushing of vascular access device: per [facility] Protocol: 0.9% NaCl pre/post medication administration and prn patency; Heparin 100 u/ml, 59m for implanted ports and Heparin 10u/ml, 553mfor all other central venous catheters.   Instructions:  May follow [facility] Anaphylaxis Protocol for First Dose Administration in the home: 0.9% NaCl at 25-50 ml/hr to maintain IV access for protocol meds. Epinephrine 0.3 ml IV/IM PRN and Benadryl 25-50 IV/IM PRN s/s of anaphylaxis.   Instructions:  AdNarcissanfusion Coordinator (RN) to assist per patient IV care needs in the home PRN.   Increase activity slowly   Complete by:  As directed    Negative Pressure Wound Therapy - Incisional   Complete by:  As directed      Allergies as of 01/22/2018      Reactions   Ace Inhibitors Cough      Medication List    STOP taking these medications   FARXIGA 10 MG Tabs tablet Generic drug:  dapagliflozin propanediol     TAKE these medications   aspirin 325 MG tablet Take 325 mg by mouth daily.   calcium-vitamin D 500-200 MG-UNIT tablet Commonly known as:  OSCAL WITH D Take 1  tablet by mouth 2 (two) times daily.   ceFAZolin  IVPB Commonly known as:  ANCEF Inject 2 g into the vein every 8 (eight) hours. Indication:  MSSA bacteremia/endocarditis Last Day of Therapy:  03/08/18 Labs - Once weekly:  CBC/D and BMP, Labs - Every other week:  ESR and CRP   cetirizine 10 MG tablet Commonly known as:  ZYRTEC Take 10 mg by mouth daily.   glipiZIDE 10 MG 24 hr tablet Commonly known as:  GLUCOTROL XL Take 10 mg by mouth 2 (two) times daily.   insulin aspart 100 UNIT/ML injection Commonly known as:  novoLOG Before each meal 3 times a day, 140-199 - 2 units, 200-250 - 4 units, 251-299 - 6 units,  300-349 - 8 units,  350 or above 10 units. Insulin PEN if approved, provide syringes and needles if needed.   latanoprost 0.005 % ophthalmic solution Commonly known as:  XALATAN Place 1 drop into both eyes at bedtime.   losartan 50 MG tablet Commonly known as:  COZAAR Take 50 mg by mouth daily.   metoprolol tartrate 50 MG tablet Commonly known as:  LOPRESSOR TAKE 2 TABLETS BY MOUTH EVERY MORNING AND 1 EVERY EVENING What changed:  See the new instructions.   multivitamin tablet Take 1 tablet by mouth daily.   naproxen sodium 220 MG tablet Commonly known as:  ALEVE Take 220 mg by mouth 2 (two) times daily as needed (pain).   oxyCODONE 5 MG immediate release tablet Commonly known as:  Oxy IR/ROXICODONE Take 1 tablet (5 mg total) by mouth every 4 (four) hours as needed for breakthrough pain (pain score 4-6).   pantoprazole 40 MG tablet Commonly known as:  PROTONIX Take 40 mg by mouth daily.   polyethylene glycol packet Commonly known as:  MIRALAX / GLYCOLAX Take 17 g by mouth daily as needed for mild constipation.   simvastatin 20 MG tablet Commonly known as:  ZOCOR Take 20 mg by mouth daily at 6 PM.   TANZEUM 50 MG Pen Generic drug:  Albiglutide Inject 50 mg into the skin every Monday.   TOUJEO SOLOSTAR 300 UNIT/ML Sopn Generic drug:  Insulin  Glargine Inject 30 Units into the skin See admin instructions. Use up to 100 units daily What changed:  how much to take   TRULICITY 1.5 OE/7.0JJ Sopn Generic drug:  Dulaglutide Inject 1.5 mg into the skin once a week.            Home Infusion Instuctions  (From admission, onward)         Start     Ordered   01/22/18 0000  Home infusion instructions [facility] May follow [place] Dosing Protocol; May administer Cathflo as needed to maintain patency of vascular access device.; Flushing of vascular access device: per [facility] Protocol: 0.9% NaCl pre/post medica...    Question Answer Comment  Instructions May follow [place] Dosing Protocol   Instructions May administer Cathflo as needed to maintain patency of vascular access device.   Instructions Flushing of vascular access device: per [facility] Protocol: 0.9% NaCl pre/post medication administration and prn patency; Heparin 100 u/ml, 51m for implanted ports and Heparin 10u/ml, 531mfor all other central venous catheters.   Instructions May follow [facility] Anaphylaxis Protocol for First Dose Administration in the home: 0.9% NaCl at 25-50 ml/hr to maintain IV access for protocol meds. Epinephrine 0.3 ml IV/IM PRN and Benadryl 25-50 IV/IM PRN s/s of anaphylaxis.   Instructions [facility] Infusion Coordinator (RN) to assist per patient IV care needs in the home PRN.      01/22/18 1024         Allergies  Allergen Reactions  . Ace Inhibitors Cough   Follow-up Information    DuNewt MinionMD Follow up in 2 day(s).   Specialty:  Orthopedic Surgery Why:  for wound vac management Contact information: [phone]      [facility] in 4 week(s).   Specialty:  Infectious Diseases Why:  Follow up with Dr. HaJohnnye Simar GrTerri PiedraNP  Contact information: [address]4678[place]38[address]c[place]     StShepard GeneralMD Follow up.   Specialties:  GeOptician, dispensingEmergency Medicine Contact information: [facility] [phone]  Minus Breeding, MD Follow up.   Specialty:  Cardiology Why:  endocarditis follow up h/o aortic valve placement Contact information: [address] [place] [address] [phone]            The results of significant diagnostics from this hospitalization (including imaging, microbiology, ancillary and laboratory) are listed below for reference.    Significant Diagnostic Studies: Ct Head Wo Contrast  Result Date: 01/14/2018 CLINICAL DATA:  Unexplained altered level consciousness. Slurred speech. Nausea. Frontal headache. Nausea and vomiting. EXAM: CT HEAD WITHOUT CONTRAST TECHNIQUE: Contiguous axial images were obtained from the base of the skull through the vertex without intravenous contrast. COMPARISON:  07/27/2014 FINDINGS: Brain: No evidence of acute infarction, hemorrhage, hydrocephalus, extra-axial collection, or mass lesion/mass effect. Mild generalized cerebral atrophy, without significant change. Vascular:  No hyperdense vessel or other acute findings. Skull: No evidence of fracture or other significant bone abnormality. Sinuses/Orbits:  No acute findings. Other: None. IMPRESSION: No acute intracranial abnormality.  Mild cerebral atrophy. Electronically Signed   By: Earle Gell M.D.   On: 01/14/2018 20:05   Ct Abdomen Pelvis W Contrast  Result Date: 01/14/2018 CLINICAL DATA:  Acute abdominal pain. EXAM: CT ABDOMEN AND PELVIS WITH CONTRAST TECHNIQUE: Multidetector CT imaging of the abdomen and pelvis was performed using the standard protocol following bolus administration of intravenous contrast. CONTRAST:  175m ISOVUE-300 IOPAMIDOL (ISOVUE-300) INJECTION 61% COMPARISON:  None. FINDINGS: Lower chest: Motion artifact through the lung bases. Mild  dependent atelectasis on the left. Heart appears prominent size. There are coronary artery calcifications. Hepatobiliary: Decreased hepatic density consistent with steatosis. No discrete focal lesion. Hyperdensity at the gallbladder neck may be stones or sludge. No pericholecystic inflammation. No biliary dilatation. Pancreas: Fatty atrophy.  No ductal dilatation or inflammation. Spleen: Normal in size. Peripheral slightly wedge-shaped low-density in the posterior spleen measures approximately 4 cm in depth. No perisplenic fluid. Adrenals/Urinary Tract: Mild thickening left adrenal gland without dominant nodule. Normal right adrenal gland. No hydronephrosis. Symmetric bilateral perinephric edema. Homogeneous renal enhancement. Possible small exophytic cyst from the mid left kidney versus perinephric edema. Urinary bladder is physiologically distended. No bladder wall thickening. Stomach/Bowel: Small hiatal hernia. Motion artifact and lack of enteric contrast limits detailed bowel assessment. Allowing for this, no evidence of obstruction, inflammation or bowel wall thickening. Normal appendix. Vascular/Lymphatic: Moderate aorto bi-iliac atherosclerosis without aneurysm. Portal vein and mesenteric vessels are patent. No enlarged abdominal or pelvic lymph nodes. Reproductive: Prostate is unremarkable. Other: No free air or free fluid. Musculoskeletal: Scoliosis and degenerative change throughout the lumbar spine. Slight flattening of the left femoral head may be chronic. No acute osseous abnormality, particularly, no left rib fractures. IMPRESSION: 1. 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 No perisplenic fluid. 2. Symmetric bilateral perinephric edema, likely chronic but can be seen in the setting of urinary tract infection. 3. Gallstones or sludge without gallbladder inflammation on CT. 4. Incidental hepatic steatosis and Aortic Atherosclerosis (ICD10-I70.0). Electronically  Signed   By: MKeith RakeM.D.   On: 01/14/2018 22:19   Mr Foot Right W Wo Contrast  Result Date: 01/16/2018 CLINICAL DATA:  Necrotic gangrenous ulcer on the lateral aspect of the right foot with ascending cellulitis. EXAM: MRI OF THE RIGHT FOREFOOT WITHOUT AND WITH CONTRAST TECHNIQUE: Multiplanar, multisequence MR imaging of the right foot was performed before and after the administration of intravenous contrast. CONTRAST:  10 cc Gadavist COMPARISON:  Radiographs 01/15/2018 FINDINGS: Evidence of prior forefoot amputation. This is at the  proximal metatarsal level. I do not see any marrow signal abnormality or enhancement to suggest osteomyelitis. No findings for septic arthritis. No subcutaneous soft tissue swelling/edema/fluid surrounding the foot suggesting cellulitis but no discrete drainable soft tissue abscess or findings for pyomyositis. IMPRESSION: 1. Changes suggesting cellulitis but no discrete drainable soft tissue abscess. 2. No MR findings for septic arthritis, osteomyelitis or pyomyositis. Electronically Signed   By: Marijo Sanes M.D.   On: 01/16/2018 10:53   Dg Chest Port 1 View  Result Date: 01/15/2018 CLINICAL DATA:  Severe sepsis. EXAM: PORTABLE CHEST 1 VIEW COMPARISON:  Radiograph 12/08/2014 FINDINGS: Stable cardiomegaly. Post CABG with prosthetic valve. No sternal wires visualized. No pulmonary edema. Scattered atelectasis. No confluent airspace disease, pleural effusion or pneumothorax. No acute osseous abnormalities. IMPRESSION: Chronic cardiomegaly with scattered atelectasis. Electronically Signed   By: Keith Rake M.D.   On: 01/15/2018 00:20   Dg Foot Complete Right  Result Date: 01/15/2018 CLINICAL DATA:  RIGHT foot infection, sepsis, prior amputation EXAM: RIGHT FOOT COMPLETE - 3+ VIEW COMPARISON:  04/06/2009 FINDINGS: Osseous demineralization. Prior transmetatarsal amputation. Joint spaces preserved. Mild dorsal spur formation at the talonavicular joint. No acute  fracture, dislocation or bone destruction. Motion artifacts degrade lateral view, no gross osseous abnormalities identified. Scattered soft tissue swelling without soft tissue gas. IMPRESSION: Transmetatarsal amputation RIGHT foot. No acute osseous findings. Electronically Signed   By: Lavonia Dana M.D.   On: 01/15/2018 00:57   [place] Ekg Site Rite  Result Date: 01/21/2018 If Site Rite image not attached, placement could not be confirmed due to current cardiac rhythm.   Microbiology: Recent Results (from the past 240 hour(s))  Blood Culture (routine x 2)     Status: Abnormal   Collection Time: 01/14/18  8:10 PM  Result Value Ref Range Status   Specimen Description BLOOD RIGHT ANTECUBITAL  Final   Special Requests   Final    BOTTLES DRAWN AEROBIC AND ANAEROBIC Blood Culture adequate volume   Culture  Setup Time   Final    GRAM POSITIVE COCCI IN BOTH AEROBIC AND ANAEROBIC BOTTLES CRITICAL RESULT CALLED TO, READ BACK BY AND VERIFIED WITH: Ellin Mayhew [facility] [id number] MLM Performed at [facility], [place] [address] [place], [place] [address]    Culture STAPHYLOCOCCUS AUREUS (A)  Final   Report Status 01/17/2018 FINAL  Final   Organism ID, Bacteria STAPHYLOCOCCUS AUREUS  Final      Susceptibility   Staphylococcus aureus - MIC*    CIPROFLOXACIN <=0.5 SENSITIVE Sensitive     ERYTHROMYCIN <=0.25 SENSITIVE Sensitive     GENTAMICIN <=0.5 SENSITIVE Sensitive     OXACILLIN 0.5 SENSITIVE Sensitive     TETRACYCLINE <=1 SENSITIVE Sensitive     VANCOMYCIN 1 SENSITIVE Sensitive     TRIMETH/SULFA <=10 SENSITIVE Sensitive     CLINDAMYCIN <=0.25 SENSITIVE Sensitive     RIFAMPIN <=0.5 SENSITIVE Sensitive     Inducible Clindamycin NEGATIVE Sensitive     * STAPHYLOCOCCUS AUREUS  Blood Culture ID Panel (Reflexed)     Status: Abnormal   Collection Time: 01/14/18  8:10 PM  Result Value Ref Range Status   Enterococcus species NOT DETECTED NOT DETECTED Final   Listeria monocytogenes NOT DETECTED NOT  DETECTED Final   Staphylococcus species DETECTED (A) NOT DETECTED Final    Comment: CRITICAL RESULT CALLED TO, READ BACK BY AND VERIFIED WITH: PHARMD M PHAM [id number] MLM    Staphylococcus aureus DETECTED (A) NOT DETECTED Final    Comment: Methicillin (  oxacillin) susceptible Staphylococcus aureus (MSSA). Preferred therapy is anti staphylococcal beta lactam antibiotic (Cefazolin or Nafcillin), unless clinically contraindicated. CRITICAL RESULT CALLED TO, READ BACK BY AND VERIFIED WITH: PHARMD M PHAM [id number] MLM    Methicillin resistance NOT DETECTED NOT DETECTED Final   Streptococcus species NOT DETECTED NOT DETECTED Final   Streptococcus agalactiae NOT DETECTED NOT DETECTED Final   Streptococcus pneumoniae NOT DETECTED NOT DETECTED Final   Streptococcus pyogenes NOT DETECTED NOT DETECTED Final   Acinetobacter baumannii NOT DETECTED NOT DETECTED Final   Enterobacteriaceae species NOT DETECTED NOT DETECTED Final   Enterobacter cloacae complex NOT DETECTED NOT DETECTED Final   Escherichia coli NOT DETECTED NOT DETECTED Final   Klebsiella oxytoca NOT DETECTED NOT DETECTED Final   Klebsiella pneumoniae NOT DETECTED NOT DETECTED Final   Proteus species NOT DETECTED NOT DETECTED Final   Serratia marcescens NOT DETECTED NOT DETECTED Final   Haemophilus influenzae NOT DETECTED NOT DETECTED Final   Neisseria meningitidis NOT DETECTED NOT DETECTED Final   Pseudomonas aeruginosa NOT DETECTED NOT DETECTED Final   Candida albicans NOT DETECTED NOT DETECTED Final   Candida glabrata NOT DETECTED NOT DETECTED Final   Candida krusei NOT DETECTED NOT DETECTED Final   Candida parapsilosis NOT DETECTED NOT DETECTED Final   Candida tropicalis NOT DETECTED NOT DETECTED Final    Comment: Performed at [facility], [address], [place], [place] [address]  Blood Culture (routine x 2)     Status: Abnormal   Collection Time: 01/14/18  8:20 PM  Result Value Ref Range Status   Specimen Description  BLOOD RIGHT FOREARM  Final   Special Requests   Final    BOTTLES DRAWN AEROBIC AND ANAEROBIC Blood Culture adequate volume   Culture  Setup Time   Final    GRAM POSITIVE COCCI IN BOTH AEROBIC AND ANAEROBIC BOTTLES CRITICAL VALUE NOTED.  VALUE IS CONSISTENT WITH PREVIOUSLY REPORTED AND CALLED VALUE.    Culture (A)  Final    STAPHYLOCOCCUS AUREUS SUSCEPTIBILITIES PERFORMED ON PREVIOUS CULTURE WITHIN THE LAST 5 DAYS. Performed at [facility], [place] [address] [place], [place] [address]    Report Status 01/17/2018 FINAL  Final  Urine culture     Status: Abnormal   Collection Time: 01/15/18  3:02 AM  Result Value Ref Range Status   Specimen Description URINE, CATHETERIZED  Final   Special Requests NONE  Final   Culture (A)  Final    <10,000 COLONIES/mL INSIGNIFICANT GROWTH Performed at [facility], [place] [address] [place], [place] [address]    Report Status 01/16/2018 FINAL  Final  MRSA PCR Screening     Status: None   Collection Time: 01/15/18  4:41 AM  Result Value Ref Range Status   MRSA by PCR NEGATIVE NEGATIVE Final    Comment:        The GeneXpert MRSA Assay (FDA approved for NASAL specimens only), is one component of a comprehensive MRSA colonization surveillance program. It is not intended to diagnose MRSA infection nor to guide or monitor treatment for MRSA infections. Performed at [facility], [place] [address], [place], [place] [address]   Culture, blood (Routine X 2) w Reflex to ID Panel     Status: None   Collection Time: 01/15/18 12:15 PM  Result Value Ref Range Status   Specimen Description BLOOD RIGHT ANTECUBITAL  Final   Special Requests   Final    BOTTLES DRAWN AEROBIC ONLY Blood Culture results may  not be optimal due to an inadequate volume of blood received in culture bottles   Culture   Final    NO GROWTH 5 DAYS Performed at [facility], [place] [address], [place], [place] [address]    Report Status 01/20/2018 FINAL  Final   Culture, blood (Routine X 2) w Reflex to ID Panel     Status: None   Collection Time: 01/15/18 12:21 PM  Result Value Ref Range Status   Specimen Description BLOOD RIGHT ARM  Final   Special Requests   Final    BOTTLES DRAWN AEROBIC ONLY Blood Culture results may not be optimal due to an inadequate volume of blood received in culture bottles   Culture   Final    NO GROWTH 5 DAYS Performed at [facility], [place] [address], [place], [place] [address]    Report Status 01/20/2018 FINAL  Final     Labs: Basic Metabolic Panel: Recent Labs  Lab 01/16/18 0327 01/17/18 0355 01/21/18 0655 01/22/18 0446  NA 138 139 135 133*  K 3.6 4.7 4.3 4.3  CL 112* 110 102 100  CO2 19* _0 GLUCOSE 90 96 244* 287*  BUN 34* 24* 17 21  CREATININE 1.27* 1.09 0.95 1.13  CALCIUM 7.4* 7.9* 8.3* 8.3*  MG 2.0  --   --   --   PHOS 1.9*  --   --   --    Liver Function Tests: No results for input(s): AST, ALT, ALKPHOS, BILITOT, PROT, ALBUMIN in the last 168 hours. No results for input(s): LIPASE, AMYLASE in the last 168 hours. No results for input(s): AMMONIA in the last 168 hours. CBC: Recent Labs  Lab 01/16/18 0327 01/17/18 0355 01/19/18 0315 01/22/18 0446  WBC 15.4* 12.7* 13.5* 14.1*  NEUTROABS  --  10.5* 10.6* 11.0*  HGB 12.5* 12.6* 13.2 12.1*  HCT 37.8* 38.0* 40.8 36.7*  MCV 87.1 87.0 86.8 86.4  PLT 80* 86* 112* 164   Cardiac Enzymes: No results for input(s): CKTOTAL, CKMB, CKMBINDEX, TROPONINI in the last 168 hours. BNP: BNP (last 3 results) No results for input(s): BNP in the last 8760 hours.  ProBNP (last 3 results) No results for input(s): PROBNP in the last 8760 hours.  CBG: Recent Labs  Lab 01/21/18 0639 01/21/18 1141 01/21/18 1628 01/21/18 2133 01/22/18 0627  GLUCAP 229* 206* 271* 304* 277*       Signed:  Florencia Reasons MD, PhD  [facility] 01/22/2018, 11:19 AM

## 2018-01-22 NOTE — Progress Notes (Signed)
Pt will DC to: [facility]  DC date:01/22/2018 Family notified:Elizabeth (niece) Transport by: [facility] @ 5pm   RN, patient, and facility notified of DC. Discharge Summary sent to facility. RN given number for report. DC packet on chart (336) [phone], room 307. Ambulance transport requested for patient.   CSW signing off. [name], [place] Social Worker [phone]

## 2018-01-22 NOTE — Progress Notes (Signed)
Physical Therapy Treatment Patient Details Name: [name] MRN: [id number] DOB: [birth year] Today's Date: 01/22/2018    History of Present Illness Pt is a 62 y/o male s/p R transtibial amputation. PMH including but not limited to L transtibial amputation in 2016, PVD, CAD, DM and HTN.    PT Comments    Pt performed increased activity and able to tolerate 2 standing trials with RW.  Pt remains with increased edema in L non surgical limb and only able to donn socks for use of prosthesis.  Plan for return to SNF next session.   Follow Up Recommendations  SNF     Equipment Recommendations  None recommended by PT    Recommendations for Other Services Rehab consult     Precautions / Restrictions Precautions Precautions: Fall Precaution Comments: wound VAC Required Braces or Orthoses: Other Brace/Splint Other Brace/Splint: limb protector Restrictions Weight Bearing Restrictions: Yes RLE Weight Bearing: Non weight bearing Other Position/Activity Restrictions: Pt is able to bear weight on LLE in prosthesis.      Mobility  Bed Mobility Overal bed mobility: Needs Assistance Bed Mobility: Supine to Sit     Supine to sit: Min assist     General bed mobility comments: Pt required assistance to advance hips to edge of bed and to elevate trunk into sitting.    Transfers Overall transfer level: Needs assistance Equipment used: Rolling walker (2 wheeled) Transfers: Sit to/from Stand Sit to Stand: Mod assist;+2 physical assistance(bed remains elevated for transfer training but not as high as previous session.  )         General transfer comment: Cues for hand placement.  Pt attempted to push from seated surface but he is unable to  so he grips B hand grips and requires lift assistance from PTA and rehab tech.  Pt performed x2 trials.  Cues for hip extension and trunk extension.    Ambulation/Gait Ambulation/Gait assistance: (NT)           General Gait Details:  unable   Stairs             Wheelchair Mobility    Modified Rankin (Stroke Patients Only)       Balance Overall balance assessment: Needs assistance Sitting-balance support: Bilateral upper extremity supported;Single extremity supported Sitting balance-Leahy Scale: Fair     Standing balance support: Bilateral upper extremity supported Standing balance-Leahy Scale: Poor Standing balance comment: Pt with heavy reliance on UEs in standing.                              Cognition Arousal/Alertness: Awake/alert Behavior During Therapy: WFL for tasks assessed/performed Overall Cognitive Status: Within Functional Limits for tasks assessed                                        Exercises      General Comments        Pertinent Vitals/Pain Pain Assessment: 0-10 Pain Score: 2  Pain Location: R residual limb at incision site and wound vac site Pain Descriptors / Indicators: Sore Pain Intervention(s): Monitored during session;Repositioned    Home Living                      Prior Function            PT Goals (current goals can now be found  in the care plan section) Acute Rehab PT Goals Patient Stated Goal: to get to rehab to be as independent as possible Potential to Achieve Goals: Good Progress towards PT goals: Progressing toward goals    Frequency    Min 3X/week      PT Plan Discharge plan needs to be updated    Co-evaluation              AM-PAC PT "6 Clicks" Daily Activity  Outcome Measure  Difficulty turning over in bed (including adjusting bedclothes, sheets and blankets)?: Unable Difficulty moving from lying on back to sitting on the side of the bed? : Unable Difficulty sitting down on and standing up from a chair with arms (e.g., wheelchair, bedside commode, etc,.)?: Unable Help needed moving to and from a bed to chair (including a wheelchair)?: A Lot Help needed walking in hospital room?: Total Help  needed climbing 3-5 steps with a railing? : Total 6 Click Score: 7    End of Session Equipment Utilized During Treatment: Gait belt Activity Tolerance: Patient tolerated treatment well Patient left: in chair;with call bell/phone within reach Nurse Communication: Mobility status PT Visit Diagnosis: Other abnormalities of gait and mobility (R26.89);Pain Pain - Right/Left: Right Pain - part of body: Leg     Time: 3794-3276 PT Time Calculation (min) (ACUTE ONLY): 26 min  Charges:  $Therapeutic Activity: 23-37 mins                     Governor Rooks, PTA Acute Rehabilitation Services Pager [phone] Office [phone]     [name] 01/22/2018, 5:15 PM

## 2018-01-22 NOTE — Progress Notes (Signed)
AVS, printed prescriptions, and social worker's paperwork placed in d/c packet for [facility]. Praveena wound vac in place per Sharol Given, MD and in working order. Report called and given to Colletta Maryland, Therapist, sports at [facility]. All questions answered to satisfaction. [facility] to transport pt via stretcher.

## 2018-01-22 NOTE — Clinical Social Work Placement (Signed)
   CLINICAL SOCIAL WORK PLACEMENT  NOTE  Date:  01/22/2018  Patient Details  Name: [name] MRN: [id number] Date of Birth: 12-Oct-1955  Clinical Social Work is seeking post-discharge placement for this patient at the [place] level of care (*CSW will initial, date and re-position this form in  chart as items are completed):  Yes   Patient/family provided with [facility] Work Department's list of facilities offering this level of care within the geographic area requested by the patient (or if unable, by the patient's family).  Yes   Patient/family informed of their freedom to choose among providers that offer the needed level of care, that participate in Medicare, Medicaid or managed care program needed by the patient, have an available bed and are willing to accept the patient.      Patient/family informed of [facility]'s ownership interest in [facility] and [facility], as well as of the fact that they are under no obligation to receive care at these facilities.  PASRR submitted to EDS on       PASRR number received on 01/20/18     Existing PASRR number confirmed on 01/20/18     FL2 transmitted to all facilities in geographic area requested by pt/family on 01/20/18     FL2 transmitted to all facilities within larger geographic area on       Patient informed that his/her managed care company has contracts with or will negotiate with certain facilities, including the following:        Yes   Patient/family informed of bed offers received.  Patient chooses bed at [facility] recommends and patient chooses bed at      Patient to be transferred to [facility] on 01[facility]22/18.  Patient to be transferred to facility by [facility]     Patient family notified on 01/22/18 of transfer.  Name of family member notified:  [name] ( niece)     PHYSICIAN       Additional Comment:     _______________________________________________ Vinie Sill, [place] 01/22/2018, 3:48 PM

## 2018-01-23 ENCOUNTER — Encounter: Payer: Self-pay | Admitting: Internal Medicine

## 2018-01-23 ENCOUNTER — Non-Acute Institutional Stay (SKILLED_NURSING_FACILITY): Payer: BLUE CROSS/BLUE SHIELD | Admitting: Internal Medicine

## 2018-01-23 DIAGNOSIS — Z89511 Acquired absence of right leg below knee: Secondary | ICD-10-CM | POA: Diagnosis not present

## 2018-01-23 DIAGNOSIS — Z794 Long term (current) use of insulin: Secondary | ICD-10-CM

## 2018-01-23 DIAGNOSIS — N289 Disorder of kidney and ureter, unspecified: Secondary | ICD-10-CM

## 2018-01-23 DIAGNOSIS — I368 Other nonrheumatic tricuspid valve disorders: Secondary | ICD-10-CM

## 2018-01-23 DIAGNOSIS — R7881 Bacteremia: Secondary | ICD-10-CM

## 2018-01-23 DIAGNOSIS — Z89512 Acquired absence of left leg below knee: Secondary | ICD-10-CM

## 2018-01-23 DIAGNOSIS — I079 Rheumatic tricuspid valve disease, unspecified: Secondary | ICD-10-CM | POA: Insufficient documentation

## 2018-01-23 DIAGNOSIS — E119 Type 2 diabetes mellitus without complications: Secondary | ICD-10-CM | POA: Diagnosis not present

## 2018-01-23 LAB — POCT ERYTHROCYTE SEDIMENTATION RATE, NON-AUTOMATED: SED RATE: 40

## 2018-01-23 NOTE — Assessment & Plan Note (Addendum)
Glucose control improved while hospitalized suggesting compliance issue with diet or meds preadmission Most recent A1c 8.5% indicating suboptimal control Continue present regimen, but reassess deprescribing based on glucose diary Endocrinology F/U to optimize complex polypharmacy if needed

## 2018-01-23 NOTE — Assessment & Plan Note (Addendum)
As noted IV antibiotics until 03/08/2018 Follow-up with ID as scheduled in 4 weeks

## 2018-01-23 NOTE — Assessment & Plan Note (Signed)
IV Cefazolin via PICC until 03/08/2018 ID F/U

## 2018-01-23 NOTE — Assessment & Plan Note (Signed)
PT/OT expresses concern with his present home situation.  Previously he had used a desk chair for transfer into the and out of the bathroom.  They question his ability to do that in the mobile home where he lives by himself with bilateral BKA's.  This should be discussed with Dr. Sharol Given

## 2018-01-23 NOTE — Progress Notes (Signed)
NURSING HOME LOCATION:  [facility] ROOM NUMBER:  [id number]  CODE STATUS:  Full Code  PCP:  [name], MD  [facility] [address] [place] [address]  This is a comprehensive admission note to [facility] performed on this date less than 30 days from date of admission.  Included are preadmission medical/surgical history; reconciled medication list; family history; social history and comprehensive review of systems.   Corrections and additions to the records were documented. Comprehensive physical exam was also performed. Additionally a clinical summary was entered for each active diagnosis pertinent to this admission in the Problem List to enhance continuity of care.  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  This was complicated by tricuspid valve endocarditis. Patient has PMH of bioprosthetic aortic valve replacement.Transesophageal echocardiogram revealed normal function of the aortic valve prosthesis but positive tricuspid valve vegetation.PICC line was placed 9/17 to allow continuation of  IV cefazolin until 03/08/2018.    Osteomyelitis necessitated right transtibial amputation 01/17/18 by Dr Sharol Given. 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  At discharge creatinine was 1.13 and BUN 21.  Losartan was held while in the hospital but resumed at discharge. Insulin-dependent type 2 diabetes was poorly controlled as exhibited by an A1c of 8.5%.  At home he been on Toujeo 65-100 units but required only 10 units of Levemir in addition to sliding scale insulin for control in the hospital.  There was a question of medication and diet compliance.  At discharge he was on Toujeo 30 units daily and sliding scale insulin.Wilder Glade was discontinued in the setting of the BKA.  Glipizide, Tanzeum (Albiglutide) and Trulicity were resumed  at discharge. Monitor of CBC & BMP were recommended at SNF.Actually ID has requested weekly & qo week specific labs while on the Cefazolin.Orthopedic follow-up with Dr. Sharol Given for wound [facility] management .  Infectious disease follow-up was to be in 4 weeks.  Cardiology to follow-up his tricuspid valve endocarditis in the context of history of aortic valve replacement.Remotely he had seen Dr Percival Spanish.  Past medical and surgical history: Includes peripheral vascular disease, history of myocardial infarction, dyslipidemia, and GERD.  The aortic valve replacement was necessitated due to the presence of a bicuspid aortic valve & insufficiency.This &  CABG were performed in 2010.  He had BKA for osteomyelitis on 12/08/2014.  Social history: Alcohol intake is rare.  He quit smoking in 2002.See housing issues in ROS.  Family history: Limited history reviewed   Review of systems: He is oriented and an excellent historian.  He states that he has never been told he should be on an anticoagulant because of his valve replacement or the endocarditis. Apparently he was only checking glucoses once a day, fasting.  They ranged from 70-211.  Glucoses above 200 are rare. Also rarely would he have slight hypoglycemia which responded to ingesting food.  He has had some rash on his lower back related to being supine.  He admits to some slight depression.  He has intermittent lower aching abdominal pain which has not required treatment. PT/OT states that he has participated well.  They are concerned as at home he had been using a desk chair to mobilize in and out of the bathroom in the context of unilateral BKA.  With the bilateral BKAs they question his ability to do that as he lives in a mobile home by  himself.  Constitutional: No fever, significant weight change, fatigue  Eyes: No redness, discharge, pain, vision change ENT/mouth: No nasal congestion, purulent discharge, earache, change in hearing, sore throat  Cardiovascular:  No chest pain, palpitations, paroxysmal nocturnal dyspnea, claudication, edema  Respiratory: No cough, sputum production, hemoptysis, DOE, significant snoring, apnea Gastrointestinal: No heartburn, dysphagia,  nausea /vomiting, rectal bleeding, melena, change in bowels Genitourinary: No dysuria, hematuria, pyuria, incontinence, nocturia Musculoskeletal: No joint stiffness, joint swelling, weakness, pain Dermatologic: No  pruritus, pustules or purulence Neurologic: No dizziness, headache, syncope, seizures, numbness, tingling Psychiatric: No significant anxiety, insomnia, anorexia Endocrine: No change in hair/skin/nails, excessive thirst, excessive hunger, excessive urination  Hematologic/lymphatic: No significant bruising, lymphadenopathy, abnormal bleeding Allergy/immunology: No itchy/watery eyes, significant sneezing, urticaria, angioedema  Physical exam:  Pertinent or positive findings: Affect is flat.  He appears disheveled and is Geneticist, molecular.  He has pattern alopecia.  He has a mustache.  He has very few teeth remaining.  He is constantly sniffing, but he denies any extrinsic symptoms.  There is a soft murmur at the right base.  At the apex there is a musical type murmur which is distant.  He has bilateral BKA's.  There is a wound VAC at the right lower extremity.  He has flexion of the right fifth digit.  He has bilateral Dupuytren's contractures, greater on the right than the left.  There is a small abrasion or ecchymosis of the left lateral wrist.  General appearance: Adequately nourished; no acute distress, increased work of breathing is present.   Lymphatic: No lymphadenopathy about the head, neck, axilla. Eyes: No conjunctival inflammation or lid edema is present. There is no scleral icterus. Ears:  External ear exam shows no significant lesions or deformities.   Nose:  External nasal examination shows no deformity or inflammation. Nasal mucosa are pink and moist without lesions,  exudates Oral exam: Lips and gums are healthy appearing.There is no oropharyngeal erythema or exudate. Neck:  No thyromegaly, masses, tenderness noted.    Heart:  Normal rate and regular rhythm. S1 and S2 normal without gallop, click, rub.  Lungs:  without wheezes, rhonchi, rales, rubs. Abdomen: Bowel sounds are normal.  Abdomen is soft and nontender with no organomegaly, hernias, masses. GU: Deferred  Extremities:  No cyanosis, clubbing, edema. Neurologic exam:  Strength equal  in upper extremities. Balance, Rhomberg, finger to nose testing could not be completed due to clinical state Skin: Warm & dry w/o tenting. No significant lesions.  See clinical summary under each active problem in the Problem List with associated updated therapeutic plan'

## 2018-01-23 NOTE — Assessment & Plan Note (Addendum)
BMP monitor ordered

## 2018-01-23 NOTE — Patient Instructions (Signed)
See assessment and plan under each diagnosis in the problem list and acutely for this visit 

## 2018-01-23 NOTE — Assessment & Plan Note (Addendum)
Orthopedic follow-up as scheduled Wound VAC management by Dr Sharol Given & SNF Wound Care

## 2018-01-24 ENCOUNTER — Encounter: Payer: Self-pay | Admitting: Internal Medicine

## 2018-01-27 ENCOUNTER — Encounter (HOSPITAL_COMMUNITY): Payer: Self-pay | Admitting: Emergency Medicine

## 2018-01-27 ENCOUNTER — Inpatient Hospital Stay (HOSPITAL_COMMUNITY): Payer: Medicare Other

## 2018-01-27 ENCOUNTER — Inpatient Hospital Stay (HOSPITAL_COMMUNITY)
Admission: EM | Admit: 2018-01-27 | Discharge: 2018-03-11 | DRG: 673 | Disposition: A | Discharge status: Skilled Nursing Facility | Payer: Medicare Other | Attending: Internal Medicine | Admitting: Internal Medicine

## 2018-01-27 ENCOUNTER — Other Ambulatory Visit: Payer: Self-pay

## 2018-01-27 ENCOUNTER — Emergency Department (HOSPITAL_COMMUNITY): Payer: Medicare Other

## 2018-01-27 DIAGNOSIS — R188 Other ascites: Secondary | ICD-10-CM | POA: Diagnosis present

## 2018-01-27 DIAGNOSIS — I252 Old myocardial infarction: Secondary | ICD-10-CM | POA: Diagnosis not present

## 2018-01-27 DIAGNOSIS — E160 Drug-induced hypoglycemia without coma: Secondary | ICD-10-CM

## 2018-01-27 DIAGNOSIS — Z992 Dependence on renal dialysis: Secondary | ICD-10-CM | POA: Diagnosis not present

## 2018-01-27 DIAGNOSIS — R627 Adult failure to thrive: Secondary | ICD-10-CM | POA: Diagnosis present

## 2018-01-27 DIAGNOSIS — Z8619 Personal history of other infectious and parasitic diseases: Secondary | ICD-10-CM

## 2018-01-27 DIAGNOSIS — Z951 Presence of aortocoronary bypass graft: Secondary | ICD-10-CM

## 2018-01-27 DIAGNOSIS — R140 Abdominal distension (gaseous): Secondary | ICD-10-CM | POA: Diagnosis not present

## 2018-01-27 DIAGNOSIS — R1030 Lower abdominal pain, unspecified: Secondary | ICD-10-CM | POA: Diagnosis not present

## 2018-01-27 DIAGNOSIS — K82A1 Gangrene of gallbladder in cholecystitis: Secondary | ICD-10-CM | POA: Diagnosis not present

## 2018-01-27 DIAGNOSIS — Z7901 Long term (current) use of anticoagulants: Secondary | ICD-10-CM

## 2018-01-27 DIAGNOSIS — I368 Other nonrheumatic tricuspid valve disorders: Secondary | ICD-10-CM

## 2018-01-27 DIAGNOSIS — E860 Dehydration: Secondary | ICD-10-CM | POA: Diagnosis present

## 2018-01-27 DIAGNOSIS — Z7982 Long term (current) use of aspirin: Secondary | ICD-10-CM

## 2018-01-27 DIAGNOSIS — Z87891 Personal history of nicotine dependence: Secondary | ICD-10-CM

## 2018-01-27 DIAGNOSIS — E875 Hyperkalemia: Secondary | ICD-10-CM | POA: Diagnosis not present

## 2018-01-27 DIAGNOSIS — R197 Diarrhea, unspecified: Secondary | ICD-10-CM

## 2018-01-27 DIAGNOSIS — I2510 Atherosclerotic heart disease of native coronary artery without angina pectoris: Secondary | ICD-10-CM | POA: Diagnosis present

## 2018-01-27 DIAGNOSIS — R34 Anuria and oliguria: Secondary | ICD-10-CM | POA: Diagnosis not present

## 2018-01-27 DIAGNOSIS — R0602 Shortness of breath: Secondary | ICD-10-CM | POA: Diagnosis not present

## 2018-01-27 DIAGNOSIS — K219 Gastro-esophageal reflux disease without esophagitis: Secondary | ICD-10-CM | POA: Diagnosis present

## 2018-01-27 DIAGNOSIS — Z881 Allergy status to other antibiotic agents status: Secondary | ICD-10-CM

## 2018-01-27 DIAGNOSIS — R1084 Generalized abdominal pain: Secondary | ICD-10-CM

## 2018-01-27 DIAGNOSIS — D72829 Elevated white blood cell count, unspecified: Secondary | ICD-10-CM | POA: Diagnosis not present

## 2018-01-27 DIAGNOSIS — T383X5A Adverse effect of insulin and oral hypoglycemic [antidiabetic] drugs, initial encounter: Secondary | ICD-10-CM | POA: Diagnosis not present

## 2018-01-27 DIAGNOSIS — K839 Disease of biliary tract, unspecified: Secondary | ICD-10-CM | POA: Diagnosis not present

## 2018-01-27 DIAGNOSIS — Z794 Long term (current) use of insulin: Secondary | ICD-10-CM | POA: Diagnosis not present

## 2018-01-27 DIAGNOSIS — I129 Hypertensive chronic kidney disease with stage 1 through stage 4 chronic kidney disease, or unspecified chronic kidney disease: Secondary | ICD-10-CM | POA: Diagnosis present

## 2018-01-27 DIAGNOSIS — E1151 Type 2 diabetes mellitus with diabetic peripheral angiopathy without gangrene: Secondary | ICD-10-CM | POA: Diagnosis present

## 2018-01-27 DIAGNOSIS — B370 Candidal stomatitis: Secondary | ICD-10-CM | POA: Diagnosis present

## 2018-01-27 DIAGNOSIS — E785 Hyperlipidemia, unspecified: Secondary | ICD-10-CM | POA: Diagnosis present

## 2018-01-27 DIAGNOSIS — K838 Other specified diseases of biliary tract: Secondary | ICD-10-CM | POA: Diagnosis not present

## 2018-01-27 DIAGNOSIS — Z833 Family history of diabetes mellitus: Secondary | ICD-10-CM | POA: Diagnosis not present

## 2018-01-27 DIAGNOSIS — Z95828 Presence of other vascular implants and grafts: Secondary | ICD-10-CM | POA: Diagnosis not present

## 2018-01-27 DIAGNOSIS — K810 Acute cholecystitis: Secondary | ICD-10-CM | POA: Diagnosis not present

## 2018-01-27 DIAGNOSIS — Z89511 Acquired absence of right leg below knee: Secondary | ICD-10-CM | POA: Diagnosis not present

## 2018-01-27 DIAGNOSIS — Z9049 Acquired absence of other specified parts of digestive tract: Secondary | ICD-10-CM | POA: Diagnosis not present

## 2018-01-27 DIAGNOSIS — E119 Type 2 diabetes mellitus without complications: Secondary | ICD-10-CM | POA: Diagnosis not present

## 2018-01-27 DIAGNOSIS — R0600 Dyspnea, unspecified: Secondary | ICD-10-CM

## 2018-01-27 DIAGNOSIS — T361X5A Adverse effect of cephalosporins and other beta-lactam antibiotics, initial encounter: Secondary | ICD-10-CM | POA: Diagnosis present

## 2018-01-27 DIAGNOSIS — Y838 Other surgical procedures as the cause of abnormal reaction of the patient, or of later complication, without mention of misadventure at the time of the procedure: Secondary | ICD-10-CM | POA: Diagnosis not present

## 2018-01-27 DIAGNOSIS — Z952 Presence of prosthetic heart valve: Secondary | ICD-10-CM | POA: Diagnosis not present

## 2018-01-27 DIAGNOSIS — J9601 Acute respiratory failure with hypoxia: Secondary | ICD-10-CM | POA: Diagnosis not present

## 2018-01-27 DIAGNOSIS — J811 Chronic pulmonary edema: Secondary | ICD-10-CM | POA: Diagnosis not present

## 2018-01-27 DIAGNOSIS — E876 Hypokalemia: Secondary | ICD-10-CM | POA: Diagnosis not present

## 2018-01-27 DIAGNOSIS — R630 Anorexia: Secondary | ICD-10-CM | POA: Diagnosis present

## 2018-01-27 DIAGNOSIS — Z978 Presence of other specified devices: Secondary | ICD-10-CM | POA: Diagnosis not present

## 2018-01-27 DIAGNOSIS — E8770 Fluid overload, unspecified: Secondary | ICD-10-CM | POA: Diagnosis not present

## 2018-01-27 DIAGNOSIS — R21 Rash and other nonspecific skin eruption: Secondary | ICD-10-CM | POA: Diagnosis present

## 2018-01-27 DIAGNOSIS — D696 Thrombocytopenia, unspecified: Secondary | ICD-10-CM | POA: Diagnosis present

## 2018-01-27 DIAGNOSIS — I480 Paroxysmal atrial fibrillation: Secondary | ICD-10-CM | POA: Diagnosis not present

## 2018-01-27 DIAGNOSIS — I700 Atherosclerosis of aorta: Secondary | ICD-10-CM

## 2018-01-27 DIAGNOSIS — B9561 Methicillin susceptible Staphylococcus aureus infection as the cause of diseases classified elsewhere: Secondary | ICD-10-CM | POA: Diagnosis present

## 2018-01-27 DIAGNOSIS — N171 Acute kidney failure with acute cortical necrosis: Secondary | ICD-10-CM | POA: Diagnosis not present

## 2018-01-27 DIAGNOSIS — E1169 Type 2 diabetes mellitus with other specified complication: Secondary | ICD-10-CM | POA: Diagnosis not present

## 2018-01-27 DIAGNOSIS — E669 Obesity, unspecified: Secondary | ICD-10-CM | POA: Diagnosis not present

## 2018-01-27 DIAGNOSIS — E872 Acidosis: Secondary | ICD-10-CM | POA: Diagnosis not present

## 2018-01-27 DIAGNOSIS — I10 Essential (primary) hypertension: Secondary | ICD-10-CM | POA: Diagnosis present

## 2018-01-27 DIAGNOSIS — Z79899 Other long term (current) drug therapy: Secondary | ICD-10-CM

## 2018-01-27 DIAGNOSIS — K8000 Calculus of gallbladder with acute cholecystitis without obstruction: Secondary | ICD-10-CM | POA: Diagnosis not present

## 2018-01-27 DIAGNOSIS — Z89512 Acquired absence of left leg below knee: Secondary | ICD-10-CM | POA: Diagnosis not present

## 2018-01-27 DIAGNOSIS — D735 Infarction of spleen: Secondary | ICD-10-CM | POA: Diagnosis present

## 2018-01-27 DIAGNOSIS — R0603 Acute respiratory distress: Secondary | ICD-10-CM

## 2018-01-27 DIAGNOSIS — K5900 Constipation, unspecified: Secondary | ICD-10-CM | POA: Diagnosis not present

## 2018-01-27 DIAGNOSIS — M4625 Osteomyelitis of vertebra, thoracolumbar region: Secondary | ICD-10-CM | POA: Diagnosis present

## 2018-01-27 DIAGNOSIS — L304 Erythema intertrigo: Secondary | ICD-10-CM | POA: Diagnosis not present

## 2018-01-27 DIAGNOSIS — I330 Acute and subacute infective endocarditis: Secondary | ICD-10-CM | POA: Diagnosis present

## 2018-01-27 DIAGNOSIS — Z6830 Body mass index (BMI) 30.0-30.9, adult: Secondary | ICD-10-CM

## 2018-01-27 DIAGNOSIS — D509 Iron deficiency anemia, unspecified: Secondary | ICD-10-CM | POA: Diagnosis not present

## 2018-01-27 DIAGNOSIS — E11649 Type 2 diabetes mellitus with hypoglycemia without coma: Secondary | ICD-10-CM | POA: Diagnosis present

## 2018-01-27 DIAGNOSIS — Z953 Presence of xenogenic heart valve: Secondary | ICD-10-CM

## 2018-01-27 DIAGNOSIS — Z6834 Body mass index (BMI) 34.0-34.9, adult: Secondary | ICD-10-CM

## 2018-01-27 DIAGNOSIS — I079 Rheumatic tricuspid valve disease, unspecified: Secondary | ICD-10-CM | POA: Diagnosis present

## 2018-01-27 DIAGNOSIS — N139 Obstructive and reflux uropathy, unspecified: Secondary | ICD-10-CM | POA: Diagnosis present

## 2018-01-27 DIAGNOSIS — K9189 Other postprocedural complications and disorders of digestive system: Secondary | ICD-10-CM | POA: Diagnosis not present

## 2018-01-27 DIAGNOSIS — Z888 Allergy status to other drugs, medicaments and biological substances status: Secondary | ICD-10-CM

## 2018-01-27 DIAGNOSIS — R109 Unspecified abdominal pain: Secondary | ICD-10-CM

## 2018-01-27 DIAGNOSIS — D631 Anemia in chronic kidney disease: Secondary | ICD-10-CM | POA: Diagnosis present

## 2018-01-27 DIAGNOSIS — E871 Hypo-osmolality and hyponatremia: Secondary | ICD-10-CM

## 2018-01-27 DIAGNOSIS — R011 Cardiac murmur, unspecified: Secondary | ICD-10-CM | POA: Diagnosis not present

## 2018-01-27 DIAGNOSIS — N179 Acute kidney failure, unspecified: Principal | ICD-10-CM | POA: Diagnosis present

## 2018-01-27 DIAGNOSIS — E46 Unspecified protein-calorie malnutrition: Secondary | ICD-10-CM | POA: Diagnosis not present

## 2018-01-27 DIAGNOSIS — M4645 Discitis, unspecified, thoracolumbar region: Secondary | ICD-10-CM | POA: Diagnosis not present

## 2018-01-27 DIAGNOSIS — R1310 Dysphagia, unspecified: Secondary | ICD-10-CM | POA: Diagnosis present

## 2018-01-27 DIAGNOSIS — N186 End stage renal disease: Secondary | ICD-10-CM

## 2018-01-27 DIAGNOSIS — R0601 Orthopnea: Secondary | ICD-10-CM | POA: Diagnosis not present

## 2018-01-27 DIAGNOSIS — J9600 Acute respiratory failure, unspecified whether with hypoxia or hypercapnia: Secondary | ICD-10-CM

## 2018-01-27 DIAGNOSIS — N183 Chronic kidney disease, stage 3 (moderate): Secondary | ICD-10-CM | POA: Diagnosis present

## 2018-01-27 DIAGNOSIS — D638 Anemia in other chronic diseases classified elsewhere: Secondary | ICD-10-CM | POA: Diagnosis present

## 2018-01-27 DIAGNOSIS — E1165 Type 2 diabetes mellitus with hyperglycemia: Secondary | ICD-10-CM | POA: Diagnosis not present

## 2018-01-27 DIAGNOSIS — Z8249 Family history of ischemic heart disease and other diseases of the circulatory system: Secondary | ICD-10-CM | POA: Diagnosis not present

## 2018-01-27 DIAGNOSIS — E1122 Type 2 diabetes mellitus with diabetic chronic kidney disease: Secondary | ICD-10-CM | POA: Diagnosis present

## 2018-01-27 DIAGNOSIS — E1159 Type 2 diabetes mellitus with other circulatory complications: Secondary | ICD-10-CM

## 2018-01-27 LAB — COMPREHENSIVE METABOLIC PANEL
ALBUMIN: 2.2 g/dL — AB (ref 3.5–5.0)
ALK PHOS: 66 U/L (ref 38–126)
ALT: <5 U/L (ref 0–44)
AST: 19 U/L (ref 15–41)
Anion gap: 21 — ABNORMAL HIGH (ref 5–15)
BILIRUBIN TOTAL: 0.5 mg/dL (ref 0.3–1.2)
BUN: 84 mg/dL — ABNORMAL HIGH (ref 8–23)
CALCIUM: 6.6 mg/dL — AB (ref 8.9–10.3)
CO2: 13 mmol/L — AB (ref 22–32)
Chloride: 94 mmol/L — ABNORMAL LOW (ref 98–111)
Creatinine, Ser: 9.6 mg/dL — ABNORMAL HIGH (ref 0.61–1.24)
GFR calc Af Amer: 6 mL/min — ABNORMAL LOW (ref >60)
GFR calc non Af Amer: 5 mL/min — ABNORMAL LOW (ref >60)
GLUCOSE: 53 mg/dL — AB (ref 70–99)
Potassium: 4.6 mmol/L (ref 3.5–5.1)
SODIUM: 128 mmol/L — AB (ref 135–145)
TOTAL PROTEIN: 5.8 g/dL — AB (ref 6.5–8.1)

## 2018-01-27 LAB — CBC
HEMATOCRIT: 39.7 % (ref 39.0–52.0)
HEMOGLOBIN: 13.2 g/dL (ref 13.0–17.0)
MCH: 28.7 pg (ref 26.0–34.0)
MCHC: 33.2 g/dL (ref 30.0–36.0)
MCV: 86.3 fL (ref 78.0–100.0)
Platelets: 165 10*3/uL (ref 150–400)
RBC: 4.6 MIL/uL (ref 4.22–5.81)
RDW: 14 % (ref 11.5–15.5)
WBC: 18.6 10*3/uL — ABNORMAL HIGH (ref 4.0–10.5)

## 2018-01-27 LAB — CBG MONITORING, ED
Glucose-Capillary: 64 mg/dL — ABNORMAL LOW (ref 70–99)
Glucose-Capillary: 84 mg/dL (ref 70–99)
Glucose-Capillary: 85 mg/dL (ref 70–99)

## 2018-01-27 MED ORDER — METOPROLOL TARTRATE 50 MG PO TABS
50.0000 mg | ORAL_TABLET | Freq: Two times a day (BID) | ORAL | Status: DC
  Administered 2018-01-27: 25 mg via ORAL
  Administered 2018-01-28 – 2018-02-04 (×15): 50 mg via ORAL
  Filled 2018-01-27 (×2): qty 1
  Filled 2018-01-27: qty 4
  Filled 2018-01-27 (×2): qty 1
  Filled 2018-01-27: qty 2
  Filled 2018-01-27 (×5): qty 1
  Filled 2018-01-27: qty 4
  Filled 2018-01-27 (×6): qty 1

## 2018-01-27 MED ORDER — SIMVASTATIN 5 MG PO TABS
5.0000 mg | ORAL_TABLET | Freq: Every day | ORAL | Status: DC
  Administered 2018-01-28: 5 mg via ORAL
  Filled 2018-01-27: qty 1

## 2018-01-27 MED ORDER — DEXTROSE 10 % IV SOLN: INTRAVENOUS | Status: AC

## 2018-01-27 MED ORDER — ONDANSETRON HCL 4 MG PO TABS: 4.0000 mg | ORAL_TABLET | Freq: Four times a day (QID) | ORAL | Status: DC | PRN

## 2018-01-27 MED ORDER — ACETAMINOPHEN 650 MG RE SUPP
650.0000 mg | Freq: Four times a day (QID) | RECTAL | Status: DC | PRN
  Administered 2018-02-24: 650 mg via RECTAL
  Filled 2018-01-27 (×2): qty 1

## 2018-01-27 MED ORDER — SODIUM CHLORIDE 0.9 % IV SOLN: 1000.0000 mL | INTRAVENOUS | Status: DC

## 2018-01-27 MED ORDER — ONDANSETRON HCL 4 MG/2ML IJ SOLN
4.0000 mg | Freq: Four times a day (QID) | INTRAMUSCULAR | Status: DC | PRN
  Administered 2018-02-24 – 2018-02-28 (×4): 4 mg via INTRAVENOUS
  Filled 2018-01-27 (×4): qty 2

## 2018-01-27 MED ORDER — INSULIN ASPART 100 UNIT/ML ~~LOC~~ SOLN
0.0000 [IU] | Freq: Every day | SUBCUTANEOUS | Status: DC
  Administered 2018-01-30: 2 [IU] via SUBCUTANEOUS
  Administered 2018-01-31 – 2018-02-01 (×2): 4 [IU] via SUBCUTANEOUS
  Administered 2018-02-02 – 2018-02-09 (×4): 2 [IU] via SUBCUTANEOUS
  Administered 2018-02-15: 3 [IU] via SUBCUTANEOUS
  Administered 2018-02-19: 2 [IU] via SUBCUTANEOUS

## 2018-01-27 MED ORDER — CEFAZOLIN IV (FOR PTA / DISCHARGE USE ONLY): 2.0000 g | Freq: Three times a day (TID) | INTRAVENOUS | Status: DC

## 2018-01-27 MED ORDER — DEXTROSE 10 % IV SOLN: INTRAVENOUS | Status: DC

## 2018-01-27 MED ORDER — METOPROLOL TARTRATE 25 MG PO TABS: 50.0000 mg | ORAL_TABLET | ORAL | Status: DC

## 2018-01-27 MED ORDER — SODIUM CHLORIDE 0.9 % IV SOLN
INTRAVENOUS | Status: AC
  Administered 2018-01-27 – 2018-01-28 (×2): via INTRAVENOUS

## 2018-01-27 MED ORDER — INSULIN ASPART 100 UNIT/ML ~~LOC~~ SOLN
0.0000 [IU] | Freq: Three times a day (TID) | SUBCUTANEOUS | Status: DC
  Administered 2018-01-30 – 2018-01-31 (×2): 1 [IU] via SUBCUTANEOUS
  Administered 2018-01-31: 3 [IU] via SUBCUTANEOUS
  Administered 2018-02-01: 5 [IU] via SUBCUTANEOUS
  Administered 2018-02-01: 7 [IU] via SUBCUTANEOUS
  Administered 2018-02-01: 5 [IU] via SUBCUTANEOUS
  Administered 2018-02-02: 3 [IU] via SUBCUTANEOUS
  Administered 2018-02-02: 5 [IU] via SUBCUTANEOUS
  Administered 2018-02-03: 1 [IU] via SUBCUTANEOUS
  Administered 2018-02-04: 5 [IU] via SUBCUTANEOUS
  Administered 2018-02-04: 7 [IU] via SUBCUTANEOUS
  Administered 2018-02-04: 3 [IU] via SUBCUTANEOUS
  Administered 2018-02-05: 1 [IU] via SUBCUTANEOUS
  Administered 2018-02-06 (×2): 3 [IU] via SUBCUTANEOUS
  Administered 2018-02-06: 2 [IU] via SUBCUTANEOUS
  Administered 2018-02-07: 3 [IU] via SUBCUTANEOUS
  Administered 2018-02-08 – 2018-02-09 (×3): 2 [IU] via SUBCUTANEOUS
  Administered 2018-02-11 – 2018-02-14 (×3): 1 [IU] via SUBCUTANEOUS
  Administered 2018-02-15: 2 [IU] via SUBCUTANEOUS
  Administered 2018-02-15: 1 [IU] via SUBCUTANEOUS
  Administered 2018-02-16: 4 [IU] via SUBCUTANEOUS
  Administered 2018-02-16: 1 [IU] via SUBCUTANEOUS
  Administered 2018-02-16: 2 [IU] via SUBCUTANEOUS
  Administered 2018-02-17: 7 [IU] via SUBCUTANEOUS
  Administered 2018-02-18: 3 [IU] via SUBCUTANEOUS
  Administered 2018-02-18: 5 [IU] via SUBCUTANEOUS
  Administered 2018-02-18: 3 [IU] via SUBCUTANEOUS
  Administered 2018-02-19: 2 [IU] via SUBCUTANEOUS
  Administered 2018-02-19 (×2): 1 [IU] via SUBCUTANEOUS
  Administered 2018-02-20 – 2018-02-22 (×6): 2 [IU] via SUBCUTANEOUS
  Administered 2018-02-22: 3 [IU] via SUBCUTANEOUS
  Administered 2018-02-23 (×2): 1 [IU] via SUBCUTANEOUS
  Administered 2018-02-23 – 2018-02-24 (×2): 2 [IU] via SUBCUTANEOUS
  Administered 2018-02-25 (×2): 3 [IU] via SUBCUTANEOUS
  Administered 2018-02-27 (×2): 1 [IU] via SUBCUTANEOUS
  Administered 2018-03-01: 5 [IU] via SUBCUTANEOUS
  Administered 2018-03-01: 7 [IU] via SUBCUTANEOUS
  Administered 2018-03-01 – 2018-03-02 (×2): 2 [IU] via SUBCUTANEOUS
  Administered 2018-03-02 – 2018-03-03 (×2): 1 [IU] via SUBCUTANEOUS
  Administered 2018-03-03: 2 [IU] via SUBCUTANEOUS
  Administered 2018-03-03: 1 [IU] via SUBCUTANEOUS
  Administered 2018-03-04: 2 [IU] via SUBCUTANEOUS
  Administered 2018-03-04 – 2018-03-07 (×4): 1 [IU] via SUBCUTANEOUS
  Administered 2018-03-07 – 2018-03-10 (×6): 2 [IU] via SUBCUTANEOUS
  Administered 2018-03-11: 1 [IU] via SUBCUTANEOUS
  Administered 2018-03-11: 2 [IU] via SUBCUTANEOUS

## 2018-01-27 MED ORDER — ACETAMINOPHEN 325 MG PO TABS
650.0000 mg | ORAL_TABLET | Freq: Four times a day (QID) | ORAL | Status: DC | PRN
  Administered 2018-01-28 – 2018-03-10 (×5): 650 mg via ORAL
  Filled 2018-01-27 (×6): qty 2

## 2018-01-27 MED ORDER — CEFAZOLIN SODIUM-DEXTROSE 2-4 GM/100ML-% IV SOLN
2.0000 g | Freq: Two times a day (BID) | INTRAVENOUS | Status: DC
  Administered 2018-01-27: 2 g via INTRAVENOUS
  Filled 2018-01-27 (×2): qty 100

## 2018-01-27 MED ORDER — CEFAZOLIN SODIUM-DEXTROSE 2-4 GM/100ML-% IV SOLN: 2.0000 g | Freq: Three times a day (TID) | INTRAVENOUS | Status: DC

## 2018-01-27 MED ORDER — SODIUM CHLORIDE 0.9 % IV BOLUS (SEPSIS)
1000.0000 mL | Freq: Once | INTRAVENOUS | Status: AC
  Administered 2018-01-27: 1000 mL via INTRAVENOUS

## 2018-01-27 MED ORDER — LATANOPROST 0.005 % OP SOLN
1.0000 [drp] | Freq: Every day | OPHTHALMIC | Status: DC
  Administered 2018-01-28 – 2018-03-10 (×39): 1 [drp] via OPHTHALMIC
  Filled 2018-01-27 (×4): qty 2.5

## 2018-01-27 MED ORDER — HEPARIN SODIUM (PORCINE) 5000 UNIT/ML IJ SOLN
5000.0000 [IU] | Freq: Three times a day (TID) | INTRAMUSCULAR | Status: DC
  Administered 2018-01-27 – 2018-02-06 (×27): 5000 [IU] via SUBCUTANEOUS
  Filled 2018-01-27 (×19): qty 1

## 2018-01-27 NOTE — ED Notes (Addendum)
CBG of 64 reported to Dr. Hillard Danker.

## 2018-01-27 NOTE — ED Provider Notes (Signed)
[facility] Provider Note   CSN: [id number] Arrival date & time: 01/27/18  1756     History   Chief Complaint Chief Complaint  Patient presents with  . Abnormal Lab    HPI [name] is a 61 y.o. male.  HPI Patient was sent to the emergency room for abnormal BUN and creatinine.  Patient has a very complex medical history.  He was admitted to the hospital on September 10.  Patient was discharged on September 18.  Patient was treated for sepsis associated with osteomyelitis.  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  Patient has been recovering in a nursing facility.  Patient states for the last several days he has not been eating or drinking well.  He is felt nauseated.  He has not had any vomiting or diarrhea though.  Patient states he has not urinated much either for the last several days.  Patient had laboratory test that showed a significantly elevated BUN and creatinine.  He was sent to the ED for further evaluation. Past Medical History:  Diagnosis Date  . Bicuspid aortic valve    a. 02/2009 s/p tissue AVR;  b. 07/2014 Echo: EF 55-60%, basal-mid inf HK, mild AS/MR, mod dil LA, PASP 44 mmHg.  Marland Kitchen CAD (coronary artery disease) October 2010   a. 02/2009 NSTEMI/Cath: 3VD; b. 02/2009 CABG x 5: LIMA->LAD, VG->Diag, VG->RI, VG->RVM->RPDA.  Marland Kitchen GERD (gastroesophageal reflux disease)   . Heart murmur   . Hyperlipidemia   . Hypertension   . Myocardial infarction ([place]) 2010  . PVD (peripheral vascular disease) ([place])    a. s/p toe amputations on L foot.  . Type II diabetes mellitus [facility])     Patient Active Problem List   Diagnosis Date Noted  . 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 S/P bilateral BKA (below knee amputation) ([place])   . Post-operative pain   . Benign essential HTN   . Diabetes mellitus type 2 in obese ([place])   . PVD (peripheral vascular disease) ([place])   . History of prosthetic  mitral valve   . Right foot infection 01/15/2018  . Sepsis ([place]) 01/15/2018  . Prosthetic valve endocarditis ([place])   . Staphylococcus aureus bacteremia   . 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 Acute hematogenous osteomyelitis, right ankle and foot ([place])   . Splenic infarct 01/14/2018  . Renal insufficiency 01/14/2018  . Severe sepsis ([place]) 01/14/2018  . Dupuytren's disease of palm of both hands 03/18/2017  . Cellulitis of left lower extremity 04/26/2016  . S/P BKA (below knee amputation) unilateral, right ([place]) 12/08/2014  . Bicuspid aortic valve   . Hyperlipidemia   . Hypertension   . Greater trochanter fracture ([place]) 07/28/2014  . Hip fracture, right ([place]) 07/28/2014  . HLD (hyperlipidemia) 07/28/2014  . GERD (gastroesophageal reflux disease) 07/28/2014  . Hip fracture ([place]) 07/28/2014  . Fall 07/28/2014  . Leukocytosis 07/28/2014  . Thrombocytopenia ([place]) 07/28/2014  . H/O aortic valve replacement 10/16/2011  . 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 PLEURAL EFFUSION 05/25/2009  . OPEN WOUND OF CHEST , COMPLICATED 95/28/4132  . PROSTHETIC VALVE - BIO/ ENDOPROSTHESIS 05/25/2009  . Insulin-requiring or dependent type II diabetes mellitus ([place]) 04/19/2009  . HYPERLIPIDEMIA 04/19/2009  . Essential hypertension 04/19/2009  . CAD (coronary artery disease) 04/19/2009  . Peripheral vascular disease ([place]) 04/19/2009  . OSTEOMYELITIS 04/19/2009  . BICUSPID AORTIC VALVE 04/19/2009    Past Surgical  History:  Procedure Laterality Date  . AMPUTATION Left 2011   great and second toes  . AMPUTATION  07/27/2011   Procedure: AMPUTATION FOOT;  Surgeon: Newt Minion, MD;  Location: [facility];  Service: Orthopedics;  Laterality: Left;  Left Midfoot Amputation   . AMPUTATION Left 12/08/2014   Procedure: Left Below Knee Amputation;  Surgeon: Newt Minion, MD;  Location: [facility];  Service: Orthopedics;  Laterality: Left;  . AMPUTATION Right 01/17/2018   Procedure: RIGHT BELOW KNEE  AMPUTATION;  Surgeon: Newt Minion, MD;  Location: [facility];  Service: Orthopedics;  Laterality: Right;  . AMPUTATION TOE Right    "all my toes on my right foot have been amputated"  . AORTIC VALVE REPLACEMENT  2010   Pericardial tissue valve  . CARDIAC CATHETERIZATION  2010  . CARDIAC VALVE REPLACEMENT    . CATARACT EXTRACTION W/ INTRAOCULAR LENS  IMPLANT, BILATERAL Bilateral   . CORONARY ARTERY BYPASS GRAFT  2010   "CABG X5"  . TEE WITHOUT CARDIOVERSION N/A 01/21/2018   Procedure: TRANSESOPHAGEAL ECHOCARDIOGRAM (TEE);  Surgeon: Buford Dresser, MD;  Location[facility]COPY;  Service: Cardiovascular;  Laterality: N/A;        Home Medications    Prior to Admission medications   Medication Sig Start Date End Date Taking? Authorizing Provider  aspirin 325 MG tablet Take 325 mg by mouth daily.     Yes [provider]  bisacodyl (DULCOLAX) 10 MG suppository Place 10 mg rectally once as needed (for constipation not relieved by Milk of Magnesia).    Yes [provider]  Calcium Citrate-Vitamin D (CALCIUM CITRATE + D3) 250-200 MG-UNIT TABS Take 1 tablet by mouth 2 (two) times daily.   Yes [provider]  ceFAZolin (ANCEF) IVPB Inject 2 g into the vein every 8 (eight) hours. Indication:  MSSA bacteremia/endocarditis Last Day of Therapy:  03/08/18 Labs - Once weekly:  CBC/D and BMP, Labs - Every other week:  ESR and CRP 01/22/18 03/09/18 Yes Florencia Reasons, MD  cetirizine (ZYRTEC) 10 MG tablet Take 10 mg by mouth daily.   Yes [provider]  Dulaglutide (TRULICITY) 1.5 DJ/5.7SV SOPN Inject 1.5 mg into the skin every Monday.    Yes [provider]  glipiZIDE (GLUCOTROL XL) 10 MG 24 hr tablet Take 10 mg by mouth daily.  04/02/16  Yes [provider]  insulin aspart (NOVOLOG) 100 UNIT/ML injection Before each meal 3 times a day, 140-199 - 2 units, 200-250 - 4 units, 251-299 - 6 units,  300-349 - 8 units,  350 or above 10 units. Insulin PEN if  approved, provide syringes and needles if needed. Patient taking differently: Inject 2-10 Units into the skin See admin instructions. Inject 2-10 units into the skin three times a day before meals, per sliding scale: BGL 140-199 = 2 units; 200-250 = 4 units; 251-299 = 6 units; 300-349 = 8 units; 350 or greater = 10 units 01/22/18  Yes Florencia Reasons, MD  ketoconazole (NIZORAL) 2 % cream Apply 1 application topically See admin instructions. Apply to both buttocks and "GL fold" rash two times a day   Yes [provider]  latanoprost (XALATAN) 0.005 % ophthalmic solution Place 1 drop into both eyes at bedtime. 07/13/14  Yes [provider]  losartan (COZAAR) 50 MG tablet Take 50 mg by mouth daily.  07/19/14  Yes [provider]  magnesium hydroxide (MILK OF MAGNESIA) 400 MG/5ML suspension Take 30 mLs by mouth once as needed for  mild constipation.   Yes [provider]  metoprolol (LOPRESSOR) 50 MG tablet TAKE 2 TABLETS BY MOUTH EVERY MORNING AND 1 EVERY EVENING Patient taking differently: Take 50-100 mg by mouth See admin instructions. Take 100 mg by mouth in the morning and 50 mg in the evening 12/30/15  Yes Fenton Malling M, PA-C  Multiple Vitamin (MULTIVITAMIN) tablet Take 1 tablet by mouth daily.     Yes [provider]  naproxen sodium (ANAPROX) 220 MG tablet Take 220 mg by mouth 2 (two) times daily as needed (pain).   Yes [provider]  oxyCODONE (OXY IR/ROXICODONE) 5 MG immediate release tablet Take 1 tablet (5 mg total) by mouth every 4 (four) hours as needed for breakthrough pain (pain score 4-6). Patient taking differently: Take 5 mg by mouth every 4 (four) hours as needed for breakthrough pain (for pain).  01/22/18  Yes Florencia Reasons, MD  pantoprazole (PROTONIX) 40 MG tablet Take 40 mg by mouth daily.     Yes [provider]  polyethylene glycol (MIRALAX / GLYCOLAX) packet Take 17 g by mouth daily as needed for mild constipation. 01/22/18  Yes  Florencia Reasons, MD  simvastatin (ZOCOR) 20 MG tablet Take 20 mg by mouth daily at 6 PM.  08/17/14  Yes [provider]  Sodium Phosphates (RA SALINE ENEMA) 19-7 GM/118ML ENEM Place 1 enema rectally once as needed (for constipation not relieved by Dulcolax suppository and notify MD if no relief from enema).   Yes [provider]  TOUJEO SOLOSTAR 300 UNIT/ML SOPN Inject 30 Units into the skin See admin instructions. Use up to 100 units daily Patient taking differently: Inject 30 Units into the skin daily.  01/22/18  Yes Florencia Reasons, MD  furosemide (LASIX) 40 MG tablet Take 40 mg by mouth 2 (two) times daily.    07/23/11  [provider]    Family History Family History  Problem Relation Age of Onset  . Heart attack Father        early age  . Heart disease Father   . Stroke Father   . Heart attack Brother        At age 64  . Stroke Brother   . Diabetes type II Mother   . Diabetes type II Sister   . Diabetes Unknown   . Coronary artery disease Unknown     Social History Social History   Tobacco Use  . Smoking status: Former Smoker    Packs/day: 1.00    Years: 24.00    Pack years: 24.00    Types: Cigarettes    Last attempt to quit: 10/15/2000    Years since quitting: 17.2  . Smokeless tobacco: Never Used  Substance Use Topics  . Alcohol use: Yes    Comment: 01/15/2018 "1-2 drinks/month"  . Drug use: No     Allergies   Ace inhibitors   Review of Systems Review of Systems  Constitutional: Negative for fever.  Respiratory: Negative for chest tightness.   Cardiovascular: Negative for chest pain.  Gastrointestinal: Negative for abdominal pain.  Genitourinary: Positive for decreased urine volume.  All other systems reviewed and are negative.    Physical Exam Updated Vital Signs BP 127/78   Pulse 77   Temp (!) 97.5 F (36.4 C) (Oral)   Resp 17   Ht 1.778 m ('5\' 10"' )   Wt 95.2 kg   SpO2 95%   BMI 30.11 kg/m   Physical Exam  HENT:  Head:  Normocephalic and  atraumatic.  Right Ear: External ear normal.  Left Ear: External ear normal.  Mucous membranes dry  Eyes: Conjunctivae are normal. Right eye exhibits no discharge. Left eye exhibits no discharge. No scleral icterus.  Neck: Neck supple. No tracheal deviation present.  Cardiovascular: Normal rate, regular rhythm and intact distal pulses.  Pulmonary/Chest: Effort normal and breath sounds normal. No stridor. No respiratory distress. He has no wheezes. He has no rales.  Abdominal: Soft. Bowel sounds are normal. He exhibits no distension. There is no tenderness. There is no rebound and no guarding.  Musculoskeletal: He exhibits no edema or tenderness.  Status post bilateral below the knee amputations, edema noted to the left upper extremity  Neurological: He is alert. He has normal strength. No cranial nerve deficit (no facial droop, extraocular movements intact, no slurred speech) or sensory deficit. He exhibits normal muscle tone. He displays no seizure activity. Coordination normal.  Skin: Skin is warm and dry. No rash noted. He is not diaphoretic.  Psychiatric: He has a normal mood and affect.  Nursing note and vitals reviewed.    ED Treatments / Results  Labs (all labs ordered are listed, but only abnormal results are displayed) Labs Reviewed  CBC - Abnormal; Notable for the following components:      Result Value   WBC 18.6 (*)    All other components within normal limits  COMPREHENSIVE METABOLIC PANEL - Abnormal; Notable for the following components:   Sodium 128 (*)    Chloride 94 (*)    CO2 13 (*)    Glucose, Bld 53 (*)    BUN 84 (*)    Creatinine, Ser 9.60 (*)    Calcium 6.6 (*)    Total Protein 5.8 (*)    Albumin 2.2 (*)    GFR calc non Af Amer 5 (*)    GFR calc Af Amer 6 (*)    Anion gap 21 (*)    All other components within normal limits  CBG MONITORING, ED - Abnormal; Notable for the following components:   Glucose-Capillary 64 (*)    All other  components within normal limits  URINALYSIS, ROUTINE W REFLEX MICROSCOPIC  CBG MONITORING, ED    EKG EKG Interpretation  Date/Time:  Monday January 27 2018 18:09:41 EDT Ventricular Rate:  83 PR Interval:    QRS Duration: 111 QT Interval:  424 QTC Calculation: 499 R Axis:   -6 Text Interpretation:  Sinus rhythm LVH with IVCD and secondary repol abnrm Borderline prolonged QT interval Since last tracing rate slower Confirmed by Dorie Rank [phone]) on 01/27/2018 6:19:46 PM   Radiology Dg Chest Portable 1 View  Result Date: 01/27/2018 CLINICAL DATA:  Intermittent LEFT chest pain.  Oliguric. EXAM: PORTABLE CHEST 1 VIEW COMPARISON:  Chest radiograph January 15, 2018 FINDINGS: RIGHT PICC distal tip projects in distal superior vena cava. No pneumothorax. Stable mild cardiomegaly. Status post aortic valve replacement. LEFT mid lung zone bandlike density with mildly elevated LEFT hemidiaphragm. No focal consolidation. Blunting of the LEFT costophrenic angle. No pneumothorax. Soft tissue planes and included osseous structures are non suspicious. Surgical clips bilateral axilla. IMPRESSION: 1. RIGHT PICC distal tip projecting in distal superior vena cava. No pneumothorax. 2. LEFT lung base atelectasis with small pleural effusion versus pleural thickening. 3. Stable cardiomegaly. Electronically Signed   By: Elon Alas M.D.   On: 01/27/2018 18:43    Procedures Procedures (including critical care time)  Medications Ordered in ED Medications  sodium chloride 0.9 % bolus 1,000 mL (  1,000 mLs Intravenous New Bag/Given 01/27/18 1824)    Followed by  0.9 %  sodium chloride infusion (has no administration in time range)     Initial Impression / Assessment and Plan / ED Course  I have reviewed the triage vital signs and the nursing notes.  Pertinent labs & imaging results that were available during my care of the patient were reviewed by me and considered in my medical decision making (see  chart for details).  Clinical Course as of Jan 28 2231  Mon Jan 27, 2018  2126 Bladder scan earlier showed approximately 30 cc of urine   [JK]  2131 Comprehensive metabolic panel(!) [JK]    Clinical Course User Index [JK] Dorie Rank, MD    Patient presented to the emergency room for evaluation of decreased urine output and elevated BUN and creatinine.  Patient's laboratory tests are consistent with oliguric acute renal failure.  Patient has elevated BUN and creatinine.  He is acidotic but no signs of hyperkalemia.  Patient had complicated recent medical course including sepsis.  Possible his acute kidney injury could be the result of his septic shock.  Symptoms could also be related to nephrotoxic agents.  Discussed with Dr Jimmy Footman.  Nephrology will consult in the am.    Discussed with Dr Myna Hidalgo who will admit.   Final Clinical Impressions(s) / ED Diagnoses   Final diagnoses:  AKI (acute kidney injury) ([place])      Dorie Rank, MD 01/27/18 2320

## 2018-01-27 NOTE — ED Notes (Signed)
Upon further assessment pt reports intermittent left chest pain.

## 2018-01-27 NOTE — H&P (Addendum)
History and Physical    [name] ZOX:[id number] DOB: [birth year] DOA: 01/27/2018  PCP: [name], MD   Patient coming from: SNF   Chief Complaint: Abnormal labs, decreased urine output   HPI: [name] is a 62 y.o. male with medical history significant for insulin-dependent diabetes mellitus, coronary artery disease, hypertension, and recent admission for sepsis with MSSA bacteremia and TV endocarditis, now presenting to the emergency department from his SNF for evaluation of acute renal failure.  Patient was hospitalized earlier this month with sepsis suspected secondary to right foot osteomyelitis, underwent BKA, was found to have MSSA bacteremia with splenic infarct and TV endocarditis, and in consultation with ID he was started on IV Ancef via PICC and discharged to SNF.  He reports an uneventful course at the nursing home until his urine output dropped off over the past couple days, noting that he has urinated very little in the past 2 to 3 days.  He has developed some mild suprapubic discomfort, but denies dysuria or flank pain and denies any fevers or chills.  He was evaluated with basic blood work at the nursing facility and found to have creatinine markedly elevated.  ED Course: Upon arrival to the ED, patient is found to be afebrile, saturating well on room air, and with vitals otherwise normal.  EKG features sinus rhythm with LVH, IVCD, and repolarization abnormality.  Chest x-ray is notable for stable cardiomegaly and left basilar atelectasis with small pleural effusion or thickening.  Chemistry panel is concerning for sodium 128, bicarbonate 13, glucose 53, BUN 84, and creatinine of 9.60, up from 1.13 a few days earlier.  CBC features a leukocytosis to 18,600.  Patient was given a liter of normal saline and dextrose in the ED and nephrology was consulted by the ED physician.  He remains hemodynamically stable and will be admitted for ongoing evaluation and  management of acute renal failure.  Review of Systems:  All other systems reviewed and apart from HPI, are negative.  Past Medical History:  Diagnosis Date  . Bicuspid aortic valve    a. 02/2009 s/p tissue AVR;  b. 07/2014 Echo: EF 55-60%, basal-mid inf HK, mild AS/MR, mod dil LA, PASP 44 mmHg.  Marland Kitchen CAD (coronary artery disease) October 2010   a. 02/2009 NSTEMI/Cath: 3VD; b. 02/2009 CABG x 5: LIMA->LAD, VG->Diag, VG->RI, VG->RVM->RPDA.  Marland Kitchen GERD (gastroesophageal reflux disease)   . Heart murmur   . Hyperlipidemia   . Hypertension   . Myocardial infarction ([place]) 2010  . PVD (peripheral vascular disease) ([place])    a. s/p toe amputations on L foot.  . Type II diabetes mellitus ([place])     Past Surgical History:  Procedure Laterality Date  . AMPUTATION Left 2011   great and second toes  . AMPUTATION  07/27/2011   Procedure: AMPUTATION FOOT;  Surgeon: Newt Minion, MD;  Location: [facility];  Service: Orthopedics;  Laterality: Left;  Left Midfoot Amputation   . AMPUTATION Left 12/08/2014   Procedure: Left Below Knee Amputation;  Surgeon: Newt Minion, MD;  Location: [place];  Service: Orthopedics;  Laterality: Left;  . AMPUTATION Right 01/17/2018   Procedure: RIGHT BELOW KNEE AMPUTATION;  Surgeon: Newt Minion, MD;  Location: [place];  Service: Orthopedics;  Laterality: Right;  . AMPUTATION TOE Right    "all my toes on my right foot have been amputated"  . AORTIC VALVE REPLACEMENT  2010   Pericardial tissue valve  . CARDIAC CATHETERIZATION  2010  .  CARDIAC VALVE REPLACEMENT    . CATARACT EXTRACTION W/ INTRAOCULAR LENS  IMPLANT, BILATERAL Bilateral   . CORONARY ARTERY BYPASS GRAFT  2010   "CABG X5"  . TEE WITHOUT CARDIOVERSION N/A 01/21/2018   Procedure: TRANSESOPHAGEAL ECHOCARDIOGRAM (TEE);  Surgeon: Buford Dresser, MD;  Location: [facility] ENDOSCOPY;  Service: Cardiovascular;  Laterality: N/A;     reports that he quit smoking about 17 years ago. His smoking use included cigarettes. He has a  24.00 pack-year smoking history. He has never used smokeless tobacco. He reports that he drinks alcohol. He reports that he does not use drugs.  Allergies  Allergen Reactions  . Ace Inhibitors Cough    Family History  Problem Relation Age of Onset  . Heart attack Father        early age  . Heart disease Father   . Stroke Father   . Heart attack Brother        At age 28  . Stroke Brother   . Diabetes type II Mother   . Diabetes type II Sister   . Diabetes Unknown   . Coronary artery disease Unknown      Prior to Admission medications   Medication Sig Start Date End Date Taking? Authorizing Provider  aspirin 325 MG tablet Take 325 mg by mouth daily.     Yes [provider]  bisacodyl (DULCOLAX) 10 MG suppository Place 10 mg rectally once as needed (for constipation not relieved by Milk of Magnesia).    Yes [provider]  Calcium Citrate-Vitamin D (CALCIUM CITRATE + D3) 250-200 MG-UNIT TABS Take 1 tablet by mouth 2 (two) times daily.   Yes [provider]  ceFAZolin (ANCEF) IVPB Inject 2 g into the vein every 8 (eight) hours. Indication:  MSSA bacteremia/endocarditis Last Day of Therapy:  03/08/18 Labs - Once weekly:  CBC/D and BMP, Labs - Every other week:  ESR and CRP 01/22/18 03/09/18 Yes Florencia Reasons, MD  cetirizine (ZYRTEC) 10 MG tablet Take 10 mg by mouth daily.   Yes [provider]  Dulaglutide (TRULICITY) 1.5 JE/5.6DJ SOPN Inject 1.5 mg into the skin every Monday.    Yes [provider]  glipiZIDE (GLUCOTROL XL) 10 MG 24 hr tablet Take 10 mg by mouth daily.  04/02/16  Yes [provider]  insulin aspart (NOVOLOG) 100 UNIT/ML injection Before each meal 3 times a day, 140-199 - 2 units, 200-250 - 4 units, 251-299 - 6 units,  300-349 - 8 units,  350 or above 10 units. Insulin PEN if approved, provide syringes and needles if needed. Patient taking differently: Inject 2-10 Units into the skin See admin instructions. Inject 2-10  units into the skin three times a day before meals, per sliding scale: BGL 140-199 = 2 units; 200-250 = 4 units; 251-299 = 6 units; 300-349 = 8 units; 350 or greater = 10 units 01/22/18  Yes Florencia Reasons, MD  ketoconazole (NIZORAL) 2 % cream Apply 1 application topically See admin instructions. Apply to both buttocks and "GL fold" rash two times a day   Yes [provider]  latanoprost (XALATAN) 0.005 % ophthalmic solution Place 1 drop into both eyes at bedtime. 07/13/14  Yes [provider]  losartan (COZAAR) 50 MG tablet Take 50 mg by mouth daily.  07/19/14  Yes [provider]  magnesium hydroxide (MILK OF MAGNESIA) 400 MG/5ML suspension Take 30 mLs by mouth once as needed for mild constipation.   Yes [provider]  metoprolol (LOPRESSOR) 50  MG tablet TAKE 2 TABLETS BY MOUTH EVERY MORNING AND 1 EVERY EVENING Patient taking differently: Take 50-100 mg by mouth See admin instructions. Take 100 mg by mouth in the morning and 50 mg in the evening 12/30/15  Yes Fenton Malling M, PA-C  Multiple Vitamin (MULTIVITAMIN) tablet Take 1 tablet by mouth daily.     Yes [provider]  naproxen sodium (ANAPROX) 220 MG tablet Take 220 mg by mouth 2 (two) times daily as needed (pain).   Yes [provider]  oxyCODONE (OXY IR/ROXICODONE) 5 MG immediate release tablet Take 1 tablet (5 mg total) by mouth every 4 (four) hours as needed for breakthrough pain (pain score 4-6). Patient taking differently: Take 5 mg by mouth every 4 (four) hours as needed for breakthrough pain (for pain).  01/22/18  Yes Florencia Reasons, MD  pantoprazole (PROTONIX) 40 MG tablet Take 40 mg by mouth daily.     Yes [provider]  polyethylene glycol (MIRALAX / GLYCOLAX) packet Take 17 g by mouth daily as needed for mild constipation. 01/22/18  Yes Florencia Reasons, MD  simvastatin (ZOCOR) 20 MG tablet Take 20 mg by mouth daily at 6 PM.  08/17/14  Yes [provider]  Sodium Phosphates (RA  SALINE ENEMA) 19-7 GM/118ML ENEM Place 1 enema rectally once as needed (for constipation not relieved by Dulcolax suppository and notify MD if no relief from enema).   Yes [provider]  TOUJEO SOLOSTAR 300 UNIT/ML SOPN Inject 30 Units into the skin See admin instructions. Use up to 100 units daily Patient taking differently: Inject 30 Units into the skin daily.  01/22/18  Yes Florencia Reasons, MD  furosemide (LASIX) 40 MG tablet Take 40 mg by mouth 2 (two) times daily.    07/23/11  [provider]    Physical Exam: Vitals:   01/27/18 1815 01/27/18 1830 01/27/18 1845 01/27/18 2100  BP: (!) 159/84 (!) 153/77 (!) 145/75 127/78  Pulse: 83 82 81 77  Resp: (!) 22 (!) 22 (!) 24 17  Temp:      TempSrc:      SpO2: 98% 97% 98% 95%  Weight:      Height:        Constitutional: NAD, calm  Eyes: PERTLA, lids and conjunctivae normal ENMT: Mucous membranes are moist. Posterior pharynx clear of any exudate or lesions.   Neck: normal, supple, no masses, no thyromegaly Respiratory: clear to auscultation bilaterally, no wheezing, no crackles. Normal respiratory effort.    Cardiovascular: S1 & S2 heard, regular rate and rhythm. No significant JVD. Abdomen: No distension, soft, mild suprapubic tenderness. Bowel sounds normal.  Musculoskeletal: no clubbing / cyanosis. Status-post bilateral BKA with wound vac at right stump.    Skin: no significant rashes, lesions, ulcers. Warm, dry, well-perfused. Neurologic: CN 2-12 grossly intact. Sensation intact. Strength 5/5 in all 4 limbs.  Psychiatric: Alert and oriented x 3. Calm, cooperative.    Labs on Admission: I have personally reviewed following labs and imaging studies  CBC: Recent Labs  Lab 01/22/18 0446 01/27/18 1819  WBC 14.1* 18.6*  NEUTROABS 11.0*  --   HGB 12.1* 13.2  HCT 36.7* 39.7  MCV 86.4 86.3  PLT 164 683   Basic Metabolic Panel: Recent Labs  Lab 01/21/18 0655 01/22/18 0446 01/27/18 1819  NA 135 133* 128*  K 4.3 4.3  4.6  CL 102 100 94*  CO2 24 25 13*  GLUCOSE 244* 287* 53*  BUN 17 21 84*  CREATININE 0.95  1.13 9.60*  CALCIUM 8.3* 8.3* 6.6*   GFR: Estimated Creatinine Clearance: 9.4 mL/min (A) (by C-G formula based on SCr of 9.6 mg/dL (H)). Liver Function Tests: Recent Labs  Lab 01/27/18 1819  AST 19  ALT <5  ALKPHOS 66  BILITOT 0.5  PROT 5.8*  ALBUMIN 2.2*   No results for input(s): LIPASE, AMYLASE in the last 168 hours. No results for input(s): AMMONIA in the last 168 hours. Coagulation Profile: Recent Labs  Lab 01/21/18 0655  INR 1.18   Cardiac Enzymes: No results for input(s): CKTOTAL, CKMB, CKMBINDEX, TROPONINI in the last 168 hours. BNP (last 3 results) No results for input(s): PROBNP in the last 8760 hours. HbA1C: No results for input(s): HGBA1C in the last 72 hours. CBG: Recent Labs  Lab 01/22/18 0627 01/22/18 1126 01/22/18 1633 01/27/18 1917 01/27/18 2124  GLUCAP 277* 250* 229* 64* 85   Lipid Profile: No results for input(s): CHOL, HDL, LDLCALC, TRIG, CHOLHDL, LDLDIRECT in the last 72 hours. Thyroid Function Tests: No results for input(s): TSH, T4TOTAL, FREET4, T3FREE, THYROIDAB in the last 72 hours. Anemia Panel: No results for input(s): VITAMINB12, FOLATE, FERRITIN, TIBC, IRON, RETICCTPCT in the last 72 hours. Urine analysis:    Component Value Date/Time   COLORURINE YELLOW 01/15/2018 0302   APPEARANCEUR CLEAR 01/15/2018 0302   LABSPEC 1.045 (H) 01/15/2018 0302   PHURINE 5.0 01/15/2018 0302   GLUCOSEU >=500 (A) 01/15/2018 0302   HGBUR MODERATE (A) 01/15/2018 0302   BILIRUBINUR NEGATIVE 01/15/2018 0302   KETONESUR NEGATIVE 01/15/2018 0302   PROTEINUR 30 (A) 01/15/2018 0302   UROBILINOGEN 0.2 07/28/2014 0433   NITRITE NEGATIVE 01/15/2018 0302   LEUKOCYTESUR NEGATIVE 01/15/2018 0302   Sepsis Labs: '@LABRCNTIP' (procalcitonin:4,lacticidven:4) )No results found for this or any previous visit (from the past 240 hour(s)).   Radiological Exams on  Admission: Dg Chest Portable 1 View  Result Date: 01/27/2018 CLINICAL DATA:  Intermittent LEFT chest pain.  Oliguric. EXAM: PORTABLE CHEST 1 VIEW COMPARISON:  Chest radiograph January 15, 2018 FINDINGS: RIGHT PICC distal tip projects in distal superior vena cava. No pneumothorax. Stable mild cardiomegaly. Status post aortic valve replacement. LEFT mid lung zone bandlike density with mildly elevated LEFT hemidiaphragm. No focal consolidation. Blunting of the LEFT costophrenic angle. No pneumothorax. Soft tissue planes and included osseous structures are non suspicious. Surgical clips bilateral axilla. IMPRESSION: 1. RIGHT PICC distal tip projecting in distal superior vena cava. No pneumothorax. 2. LEFT lung base atelectasis with small pleural effusion versus pleural thickening. 3. Stable cardiomegaly. Electronically Signed   By: Elon Alas M.D.   On: 01/27/2018 18:43    EKG: Independently reviewed. Sinus rhythm, LVH with IVCD and repolarization abnormality.   Assessment/Plan  1. Acute kidney injury  - Presents from SNF with acute renal failure, SCr 9.60 on admission, up from 1.13 on 01/22/18  - Nephrology consulted by ED physician and much appreciated  - Given a liter NS in ED and continued on NS infusion  - Check urine chemistries and renal US, continue IVF hydration, renally-dose medications, avoid nephrotoxins, and repeat chem panel    2. Hypoglycemia; IDDM  - Serum glucose 53 in ED, treated with dextrose  - Likely secondary to acute renal failure with continued use of insulin and glipizide - Check CBG's, use dextrose as needed, low-intensity SSI with Novolog as needed   3. MSSA bacteremia; tricuspid valve endocarditis  - Admitted earlier this month with sepsis secondary to right foot infection and was found to have bacteremia, splenic infarct, and  TV endocarditis; he underwent BKA and PICC was placed for Ancef with end-date 03/08/18 per ID consultant  - Afebrile on admission -  Continue Ancef with renal dosing; appreciate pharmacy input, ARF unlikely secondary to Ancef    4. CAD - No anginal complaint  - Continue statin with renal-dosing, hold ARB until renal function stabilizes   5. Hypertension  - BP at goal  - Hold losartan in light of AKI    6. Hyponatremia  - Serum sodium is 128 on admission in setting of acute renal failure  - Given a liter of NS in ED and continued on NS infusion  - Repeat chem panel overnight to avoid a too rapid correction    DVT prophylaxis: sq heparin  Code Status: Full  Family Communication: Discussed with patient  Consults called: Nephrology Admission status: Inpatient     Vianne Bulls, MD [facility] Pager [phone]  If 7PM-7AM, please contact night-coverage [email or web address] Password [facility]  01/27/2018, 10:21 PM

## 2018-01-27 NOTE — ED Notes (Signed)
Pt returned from ultrasound

## 2018-01-27 NOTE — ED Triage Notes (Signed)
Pt arrived from [facility] for reports of elevated kidney function, creat. 8.8 BUN80.1 per facility paperwork. Pt reports nausea and that he hasn't urinated in "days" Pt has R arm PICC for abx r/t sepsis.

## 2018-01-27 NOTE — Progress Notes (Signed)
Pharmacy Antibiotic Note  [name] is a 62 y.o. male admitted on 01/27/2018 with bacteremia.  Pharmacy has been consulted for cefazolin dosing. Pt admitted with AKI. Of note, patient was on cefazolin prior to admission for staph aureus bacteremia with vegetation on ECHO.   Plan: -Decrease cefazolin to 2 gm IV q 12 hours -Monitor renal function and adjust abx accordingly   Height: 5\' 10"  (177.8 cm) Weight: 209 lb 14.1 oz (95.2 kg) IBW/kg (Calculated) : 73  Temp (24hrs), Avg:97.5 F (36.4 C), Min:97.5 F (36.4 C), Max:97.5 F (36.4 C)  Recent Labs  Lab 01/21/18 0655 01/22/18 0446 01/27/18 1819  WBC  --  14.1* 18.6*  CREATININE 0.95 1.13 9.60*    Estimated Creatinine Clearance: 9.4 mL/min (A) (by C-G formula based on SCr of 9.6 mg/dL (H)).    Allergies  Allergen Reactions  . Ace Inhibitors Cough     Thank you for allowing pharmacy to be a part of this patient's care.  Albertina Parr, PharmD., BCPS Clinical Pharmacist

## 2018-01-27 NOTE — Clinical Social Work Note (Signed)
Clinical Social Work Assessment  Patient Details  Name: [name] MRN: [id number] Date of Birth: 11/05/1955  Date of referral:  01/27/18               Reason for consult:  Facility Placement                Permission sought to share information with:  Case Manager, Family Supports Permission granted to share information::  Yes, Verbal Permission Granted  Name::     [phone] (Niece - [name])  Agency::     Relationship::     Contact Information:     Housing/Transportation Living arrangements for the past 2 months:  Skilled Nursing Facility(Recent D/C to [employer]) Source of Information:  Patient, Other (Comment Required)(Niece) Patient Interpreter Needed:  None Criminal Activity/Legal Involvement Pertinent to Current Situation/Hospitalization:  No - Comment as needed Significant Relationships:  Siblings, Other Family Members Lives with:  Facility Resident([facility]) Do you feel safe going back to the place where you live?  No Need for family participation in patient care:  Yes (Comment)  Care giving concerns:  Pt's niece, [name], expressed concerns about pt's care at [facility]. Pt's niece specifically sited pt's in and output over the past 5 days. Pt had not urinated for 5 days and spiked a fever.   Social Worker assessment / plan:  CSW and RNCM met at pt's bedside with niece. Pt gave permission for niece to remain in the room. Pt's niece expressed concerns about treatment at [facility]. Pt stated that multiple times pt has rung the bell for help and not received care for over 30 minutes. Pt stated understanding that under staffed, but expressed concerns for his well being. CSW and RNCM encouraged pt's niece to express concerns to Scientist, physiological at [facility]. CSW provided pt with number and information about Ombudsman for [place] area to speak to about concerns.  Employment status:    Insurance information:  Other (Comment Required)([employer]) PT  Recommendations:  Not assessed at this time Information / Referral to community resources:  [place], Other (Comment Required)(Ombudsman - Horticulturist, commercial)  Patient/Family's Response to care:  Pt and pt's family did not have any concerns about pt's care in the hospital. Family and pt expressed a number of concerns about pt's care at [facility].   Patient/Family's Understanding of and Emotional Response to Diagnosis, Current Treatment, and Prognosis:  Pt understanding of current treatment and prognosis.   Emotional Assessment Appearance:  Appears stated age Attitude/Demeanor/Rapport:    Affect (typically observed):  Appropriate, Accepting, Calm Orientation:  Oriented to Self, Oriented to Place, Oriented to  Time, Oriented to Situation Alcohol / Substance use:    Psych involvement (Current and /or in the community):  No (Comment)  Discharge Needs  Concerns to be addressed:  Discharge Planning Concerns Readmission within the last 30 days:  Yes Current discharge risk:  Physical Impairment, Terminally ill Barriers to Discharge:  Continued Medical Work up, Other(Doesn't want to return to [facility])   Wendelyn Breslow, LCSW 01/27/2018, 10:44 PM

## 2018-01-27 NOTE — ED Notes (Signed)
ED Provider at bedside. 

## 2018-01-27 NOTE — ED Notes (Signed)
Pt in ultrasound at this time

## 2018-01-28 ENCOUNTER — Inpatient Hospital Stay (HOSPITAL_COMMUNITY): Payer: Medicare Other

## 2018-01-28 ENCOUNTER — Other Ambulatory Visit: Payer: Self-pay

## 2018-01-28 ENCOUNTER — Encounter (HOSPITAL_COMMUNITY): Payer: Self-pay | Admitting: *Deleted

## 2018-01-28 DIAGNOSIS — I079 Rheumatic tricuspid valve disease, unspecified: Secondary | ICD-10-CM

## 2018-01-28 DIAGNOSIS — Z87891 Personal history of nicotine dependence: Secondary | ICD-10-CM

## 2018-01-28 DIAGNOSIS — Z833 Family history of diabetes mellitus: Secondary | ICD-10-CM

## 2018-01-28 DIAGNOSIS — T361X5A Adverse effect of cephalosporins and other beta-lactam antibiotics, initial encounter: Secondary | ICD-10-CM

## 2018-01-28 DIAGNOSIS — Z794 Long term (current) use of insulin: Secondary | ICD-10-CM

## 2018-01-28 DIAGNOSIS — Z89511 Acquired absence of right leg below knee: Secondary | ICD-10-CM

## 2018-01-28 DIAGNOSIS — D735 Infarction of spleen: Secondary | ICD-10-CM

## 2018-01-28 DIAGNOSIS — K5900 Constipation, unspecified: Secondary | ICD-10-CM

## 2018-01-28 DIAGNOSIS — N179 Acute kidney failure, unspecified: Principal | ICD-10-CM

## 2018-01-28 DIAGNOSIS — Z89512 Acquired absence of left leg below knee: Secondary | ICD-10-CM

## 2018-01-28 DIAGNOSIS — E1151 Type 2 diabetes mellitus with diabetic peripheral angiopathy without gangrene: Secondary | ICD-10-CM

## 2018-01-28 DIAGNOSIS — Z8249 Family history of ischemic heart disease and other diseases of the circulatory system: Secondary | ICD-10-CM

## 2018-01-28 DIAGNOSIS — Z8619 Personal history of other infectious and parasitic diseases: Secondary | ICD-10-CM

## 2018-01-28 LAB — URINALYSIS, ROUTINE W REFLEX MICROSCOPIC
Bacteria, UA: NONE SEEN
Bilirubin Urine: NEGATIVE
GLUCOSE, UA: NEGATIVE mg/dL
Ketones, ur: NEGATIVE mg/dL
NITRITE: NEGATIVE
RBC / HPF: >50 RBC/hpf — ABNORMAL HIGH (ref 0–5)
SPECIFIC GRAVITY, URINE: 1.017 (ref 1.005–1.030)
WBC, UA: >50 WBC/hpf — ABNORMAL HIGH (ref 0–5)
pH: 6 (ref 5.0–8.0)

## 2018-01-28 LAB — CBC
HEMATOCRIT: 36.8 % — AB (ref 39.0–52.0)
Hemoglobin: 12.6 g/dL — ABNORMAL LOW (ref 13.0–17.0)
MCH: 28.8 pg (ref 26.0–34.0)
MCHC: 34.2 g/dL (ref 30.0–36.0)
MCV: 84.2 fL (ref 78.0–100.0)
Platelets: 146 10*3/uL — ABNORMAL LOW (ref 150–400)
RBC: 4.37 MIL/uL (ref 4.22–5.81)
RDW: 13.9 % (ref 11.5–15.5)
WBC: 15.5 10*3/uL — AB (ref 4.0–10.5)

## 2018-01-28 LAB — BASIC METABOLIC PANEL
ANION GAP: 21 — AB (ref 5–15)
BUN: 88 mg/dL — ABNORMAL HIGH (ref 8–23)
CALCIUM: 6.5 mg/dL — AB (ref 8.9–10.3)
CO2: 12 mmol/L — AB (ref 22–32)
Chloride: 96 mmol/L — ABNORMAL LOW (ref 98–111)
Creatinine, Ser: 9.6 mg/dL — ABNORMAL HIGH (ref 0.61–1.24)
GFR calc Af Amer: 6 mL/min — ABNORMAL LOW (ref >60)
GFR, EST NON AFRICAN AMERICAN: 5 mL/min — AB (ref >60)
Glucose, Bld: 123 mg/dL — ABNORMAL HIGH (ref 70–99)
POTASSIUM: 4.9 mmol/L (ref 3.5–5.1)
Sodium: 129 mmol/L — ABNORMAL LOW (ref 135–145)

## 2018-01-28 LAB — GLUCOSE, CAPILLARY
GLUCOSE-CAPILLARY: 61 mg/dL — AB (ref 70–99)
GLUCOSE-CAPILLARY: 76 mg/dL (ref 70–99)
Glucose-Capillary: 77 mg/dL (ref 70–99)
Glucose-Capillary: 87 mg/dL (ref 70–99)

## 2018-01-28 LAB — CREATININE, URINE, RANDOM: CREATININE, URINE: 36.63 mg/dL

## 2018-01-28 LAB — SODIUM, URINE, RANDOM: SODIUM UR: 124 mmol/L

## 2018-01-28 MED ORDER — SODIUM CHLORIDE 0.9% FLUSH
10.0000 mL | INTRAVENOUS | Status: DC | PRN
  Administered 2018-02-01: 20 mL
  Administered 2018-02-03 – 2018-02-06 (×3): 10 mL
  Administered 2018-02-07: 30 mL
  Administered 2018-02-08 – 2018-02-09 (×2): 10 mL
  Administered 2018-02-09: 20 mL
  Administered 2018-02-11: 10 mL
  Administered 2018-02-12: 30 mL
  Administered 2018-02-12 – 2018-02-19 (×4): 10 mL
  Administered 2018-02-20: 30 mL
  Administered 2018-02-21 – 2018-02-28 (×5): 10 mL
  Administered 2018-03-07: 40 mL
  Administered 2018-03-11 (×2): 10 mL
  Filled 2018-01-28 (×23): qty 40

## 2018-01-28 MED ORDER — CEFAZOLIN SODIUM-DEXTROSE 1-4 GM/50ML-% IV SOLN: 1.0000 g | INTRAVENOUS | Status: DC

## 2018-01-28 MED ORDER — NYSTATIN 100000 UNIT/GM EX POWD
Freq: Two times a day (BID) | CUTANEOUS | Status: DC
  Administered 2018-01-28 – 2018-03-10 (×68): via TOPICAL
  Filled 2018-01-28 (×4): qty 15

## 2018-01-28 MED ORDER — DEXTROSE-NACL 5-0.9 % IV SOLN
INTRAVENOUS | Status: DC
  Administered 2018-01-28 – 2018-01-29 (×3): via INTRAVENOUS

## 2018-01-28 MED ORDER — DEXTROSE 50 % IV SOLN
INTRAVENOUS | Status: AC
  Administered 2018-01-28: 08:00:00
  Filled 2018-01-28: qty 50

## 2018-01-28 MED ORDER — POLYETHYLENE GLYCOL 3350 17 G PO PACK
17.0000 g | PACK | Freq: Every day | ORAL | Status: DC
  Administered 2018-01-29 – 2018-02-06 (×2): 17 g via ORAL
  Filled 2018-01-28 (×10): qty 1

## 2018-01-28 MED ORDER — SENNOSIDES-DOCUSATE SODIUM 8.6-50 MG PO TABS
1.0000 | ORAL_TABLET | Freq: Two times a day (BID) | ORAL | Status: DC
  Administered 2018-01-28 – 2018-02-05 (×8): 1 via ORAL
  Filled 2018-01-28 (×21): qty 1

## 2018-01-28 MED ORDER — CLONAZEPAM 0.25 MG PO TBDP
0.2500 mg | ORAL_TABLET | Freq: Two times a day (BID) | ORAL | Status: DC | PRN
  Administered 2018-01-28 – 2018-03-10 (×29): 0.25 mg via ORAL
  Filled 2018-01-28 (×31): qty 1

## 2018-01-28 MED ORDER — CLONAZEPAM 0.5 MG PO TABS: 0.2500 mg | ORAL_TABLET | Freq: Two times a day (BID) | ORAL | Status: DC | PRN

## 2018-01-28 NOTE — Consult Note (Signed)
[place] Nurse wound consult note: POD 11 Reason for Consult:Patient consult for recent (01/17/18) right BKA (Dr. Sharol Given).  Prevena NPWT incisional device in place.  Call placed to Dr. Sharol Given who instructed this writer to remove Prevena device and replace stump shrinker.  He will see the patient. Wound type:Surgical Pressure Injury POA: NA Measurement: 21cm closely approximated incision with 31 stapes intact. Two areas of pinpoint opening with scant bleeding. Wound bed:N/A Drainage (amount, consistency, odor) As described above Periwound: intact Dressing procedure/placement/frequency: I have asked bedside RN to twice daily cleanse the incision with NS and paint same with a betadine swabstick for its antimicrobial and astringent properties, then replace stump shrinker when dry.  Dr. Sharol Given will provide any other orders.  Stump shrinker applied.  [facility] nursing team will not follow, but will remain available to this patient, the nursing and medical teams.  Please re-consult if needed. Thanks, Maudie Flakes, MSN, RN, [place], Arther Abbott  Pager# [phone]

## 2018-01-28 NOTE — Progress Notes (Addendum)
PROGRESS NOTE  [name] JKK:[id number] DOB: 1955-05-31 DOA: 01/27/2018 PCP: [name], MD  Brief Summary:  Acute renal failure of unclear etiology Patient was Discharged to SNF on 9/18 with MSSA bacteremia and TV endocarditis, receiving 6 wks Ancef per ID.  Nephro /ID/ortho duda following    HPI/Recap of past 24 hours:  Poor historian, reports has not make much urine last several days Foley placed with minimal urine output, urine is cloudy  Has poor appetite, last bm was Friday He reports left forearm and left thigh edema He reports rash on buttock He denies pain, no fever,  No hypoxia   Assessment/Plan: Principal Problem:   Acute renal failure (ARF) (HCC) Active Problems:   Insulin-requiring or dependent type II diabetes mellitus (HCC)   Essential hypertension   CAD (coronary artery disease)   Leukocytosis   S/P BKA (below knee amputation) unilateral, right (HCC)   Endocarditis of tricuspid valve   Hypoglycemia due to insulin   Hyponatremia  ARF of unclear etiology -he reports has been oligouria to anuria for the last 2-3days -cr 1.13 at discharge on 9/18, cr on presentation on 9/23 is 9.6 -no obstruction on renal US -urine is very cloudy, urine culture in process,  -nephrology consulted, case discussed with Dr Web who recommend continue hydration, get CT scan and d/c ancef for now -will follow nephrology input   Hypoglycemia: -Serum glucose 53 in ED, treated with dextrose  - Likely secondary to acute renal failure with continued use of insulin, glipizide, truliticy, tanzeum  Hyponatremia: -hard to determine volume status , his lungs are clear, he reports poor appetite, but he does has left forearm and left lateral thigh edema -continue hydration, repeat lab in am -nephrology following  Insulin dependent dm2 -a1c 8.5 -he reports at home he uses toujeo 65-100units daily prior to hospital admission on 9/10.  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 -He was hospitalized from 9/11 to 9/18 and discharged on iv ancef/picc -he is sent back to the hospital on 9/23 due to ARF of unclear etiology, nephrology recommended d/c ancef -case discussed with infectious disease, Dr [name] will consult for abx recommendations -Dr Sharol Given notified   Coronary artery disease.h/o aortic valve replacement Onaspirin, simvastatin, betablocker per home regimen. Denies chest pain  He is to follow up with cardiology Dr Percival Spanish   Dysphagia? He report difficult swallow solid, but no chocking , no vomiting Soft diet for now   HTN continue home meds metoprolol Hold losartan dur to renal failure  Skin rash: Moisture related? He reports has been laying or sitting on his back, he has declined bathing while he was at snf Topical nystatin powder Wound care consulted  FTT: bilateral BKA  Code Status: full  Family Communication: patient   Disposition Plan: SNF once medically stable   Consultants:  Nephrology  Ortho Dr Sharol Given  Infectious disease  Procedures:  Foley insertion  Antibiotics:  Per infectious disease   Objective: BP 139/66 (BP Location: Left Arm)   Pulse 83   Temp 97.9 F (36.6 C) (Oral)   Resp 18   Ht 5\' 10"  (1.778  m)   Wt 95.2 kg   SpO2 100%   BMI 30.11 kg/m   Intake/Output Summary (Last 24 hours) at 01/28/2018 0914 Last data filed at 01/28/2018 0600 Gross per 24 hour  Intake 1805.61 ml  Output 0 ml  Net 1805.61 ml   Filed Weights   01/27/18 1803  Weight: 95.2 kg    Exam: Patient is examined  daily including today on 01/28/2018, exams remain the same as of yesterday except that has changed    General:  NAD  Cardiovascular: RRR, + murmur at right upper sternal border  Respiratory: CTABL  Abdomen: protuberance abdomen, NT, positive BS  Musculoskeletal: left forearm and left lateral thigh pitting Edema, bilateral BKA  Neuro: alert, oriented   Skin: skin excoriation, papular rash buttock, candida rash bilateral groin,      Data Reviewed: Basic Metabolic Panel: Recent Labs  Lab 01/22/18 0446 01/27/18 1819 01/28/18 0059  NA 133* 128* 129*  K 4.3 4.6 4.9  CL 100 94* 96*  CO2 25 13* 12*  GLUCOSE 287* 53* 123*  BUN 21 84* 88*  CREATININE 1.13 9.60* 9.60*  CALCIUM 8.3* 6.6* 6.5*   Liver Function Tests: Recent Labs  Lab 01/27/18 1819  AST 19  ALT <5  ALKPHOS 66  BILITOT 0.5  PROT 5.8*  ALBUMIN 2.2*   No results for input(s): LIPASE, AMYLASE in the last 168 hours. No results for input(s): AMMONIA in the last 168 hours. CBC: Recent Labs  Lab 01/22/18 0446 01/27/18 1819 01/28/18 0059  WBC 14.1* 18.6* 15.5*  NEUTROABS 11.0*  --   --   HGB 12.1* 13.2 12.6*  HCT 36.7* 39.7 36.8*  MCV 86.4 86.3 84.2  PLT 164 165 146*   Cardiac Enzymes:   No results for input(s): CKTOTAL, CKMB, CKMBINDEX, TROPONINI in the last 168 hours. BNP (last 3 results) No results for input(s): BNP in the last 8760 hours.  ProBNP (last 3 results) No results for input(s): PROBNP in the last 8760 hours.  CBG: Recent Labs  Lab 01/22/18 1633 01/27/18 1917 01/27/18 2124 01/27/18 2338 01/28/18 0738  GLUCAP 229* 64* 85 84 61*    No results found for this or any previous visit (from the past 240 hour(s)).   Studies: US Renal  Result Date: 01/27/2018 CLINICAL DATA:  62 year old male with acute renal failure. EXAM: RENAL / URINARY TRACT ULTRASOUND COMPLETE COMPARISON:  CT of abdomen pelvis dated 01/14/2018 FINDINGS: Right Kidney: Length: 12.2 cm. Normal echogenicity. No  hydronephrosis or shadowing stone. Left Kidney: Length: 11.7 cm. Normal echogenicity. No hydronephrosis or shadowing stone. Bladder: There is layering debris in the urinary bladder. Correlation with urinalysis recommended. The liver is slightly echogenic likely fatty infiltration. There is probable Riedel's lobe anatomy. IMPRESSION: 1. No hydronephrosis or shadowing stone. 2. Layering debris within the urinary bladder. Correlation with urinalysis recommended. Electronically Signed   By: Anner Crete M.D.   On: 01/27/2018 22:38   Dg Chest Portable 1 View  Result Date: 01/27/2018 CLINICAL DATA:  Intermittent LEFT chest pain.  Oliguric. EXAM: PORTABLE CHEST 1 VIEW COMPARISON:  Chest radiograph January 15, 2018 FINDINGS: RIGHT PICC distal tip projects in distal superior vena cava. No pneumothorax. Stable mild cardiomegaly. Status post aortic valve replacement. LEFT mid lung zone bandlike density with mildly elevated LEFT hemidiaphragm. No focal consolidation. Blunting of the LEFT costophrenic angle. No pneumothorax. Soft tissue planes and included osseous structures are non suspicious. Surgical clips bilateral axilla. IMPRESSION: 1. RIGHT PICC distal tip projecting in distal superior vena  cava. No pneumothorax. 2. LEFT lung base atelectasis with small pleural effusion versus pleural thickening. 3. Stable cardiomegaly. Electronically Signed   By: Elon Alas M.D.   On: 01/27/2018 18:43    Scheduled Meds: . heparin  5,000 Units Subcutaneous Q8H  . insulin aspart  0-5 Units Subcutaneous QHS  . insulin aspart  0-9 Units Subcutaneous TID WC  . latanoprost  1 drop Both Eyes QHS  . metoprolol tartrate  50 mg Oral BID  . polyethylene glycol  17 g Oral Daily  . senna-docusate  1 tablet Oral BID  . simvastatin  5 mg Oral q1800    Continuous Infusions: . sodium chloride 115 mL/hr at 01/28/18 0751  .  ceFAZolin (ANCEF) IV Stopped (01/28/18 0022)     Time spent: 35 mins I have personally reviewed  and interpreted on  01/28/2018 daily labs, imagings as discussed above under date review session and assessment and plans.  I reviewed all nursing notes, pharmacy notes, consultant notes,  vitals, pertinent old records  I have discussed plan of care as described above with RN , patient on 01/28/2018   Florencia Reasons MD, PhD  [facility] Pager [phone]. If 7PM-7AM, please contact night-coverage at [email or web address], password [facility] 01/28/2018, 9:14 AM  LOS: 1 day

## 2018-01-28 NOTE — Progress Notes (Signed)
Received report.  Room ready.   

## 2018-01-28 NOTE — Consult Note (Signed)
[facility]    Date of Admission:  01/27/2018   Total days of antibiotics: 15 (ancef)               Reason for Consult: ARF/AIN due to Ancef    Referring Provider: Erlinda Hong   Assessment: ARF ? Thrush on tongue TV IE Prev MSSA bacteremia Splenic infarct  Plan: 1. Would favor holding anbx for 24h 2. Consider starting daptomycin or zyvox 3. Will re-assess the thrush on his tongue in am 4. Will f/u his Cr 5. Greatly appreciate renal eval.    Thank you so much for this interesting consult,  Principal Problem:   Acute renal failure (ARF) ([place]) Active Problems:   Insulin-requiring or dependent type II diabetes mellitus ([place])   Essential hypertension   CAD (coronary artery disease)   Leukocytosis   S/P BKA (below knee amputation) unilateral, right (HCC)   Endocarditis of tricuspid valve   Hypoglycemia due to insulin   Hyponatremia   . heparin  5,000 Units Subcutaneous Q8H  . insulin aspart  0-5 Units Subcutaneous QHS  . insulin aspart  0-9 Units Subcutaneous TID WC  . latanoprost  1 drop Both Eyes QHS  . metoprolol tartrate  50 mg Oral BID  . nystatin   Topical BID  . polyethylene glycol  17 g Oral Daily  . senna-docusate  1 tablet Oral BID  . simvastatin  5 mg Oral q1800    HPI: [name] is a 62 y.o. male with hx of DM2, prev adm to [facility] 01-14-18 with mssa bacteremia, splenic infarcts and gangrenous R foot. He underwent R BKA on 9-13. He had TEE on 9-17 which showed a TV lesion.  He was d/c to SNF on 9-18 on ancef 2 g q8h.  He returns 9-23 with decreasing u/o over previous 2-3 days. He was afebrile in ED, had no residual and was found to have Cr of  9.6 (1.13 at d/c). His WBC was 18.6.    Review of Systems: Review of Systems  Constitutional: Negative for chills and fever.  Respiratory: Negative for shortness of breath.   Gastrointestinal: Positive for constipation.  anuric No BM for 3 days Please see HPI. All other  systems reviewed and negative.   Past Medical History:  Diagnosis Date  . Bicuspid aortic valve    a. 02/2009 s/p tissue AVR;  b. 07/2014 Echo: EF 55-60%, basal-mid inf HK, mild AS/MR, mod dil LA, PASP 44 mmHg.  Marland Kitchen CAD (coronary artery disease) October 2010   a. 02/2009 NSTEMI/Cath: 3VD; b. 02/2009 CABG x 5: LIMA->LAD, VG->Diag, VG->RI, VG->RVM->RPDA.  Marland Kitchen GERD (gastroesophageal reflux disease)   . Heart murmur   . Hyperlipidemia   . Hypertension   . Myocardial infarction ([place]) 2010  . PVD (peripheral vascular disease) ([place])    a. s/p toe amputations on L foot.  . Type II diabetes mellitus ([place])     Social History   Tobacco Use  . Smoking status: Former Smoker    Packs/day: 1.00    Years: 24.00    Pack years: 24.00    Types: Cigarettes    Last attempt to quit: 10/15/2000    Years since quitting: 17.2  . Smokeless tobacco: Never Used  Substance Use Topics  . Alcohol use: Yes    Comment: 01/15/2018 "1-2 drinks/month"  . Drug use: No    Family History  Problem Relation Age of Onset  . Heart attack Father  early age  . Heart disease Father   . Stroke Father   . Heart attack Brother        At age 61  . Stroke Brother   . Diabetes type II Mother   . Diabetes type II Sister   . Diabetes Unknown   . Coronary artery disease Unknown      Medications:  Scheduled: . heparin  5,000 Units Subcutaneous Q8H  . insulin aspart  0-5 Units Subcutaneous QHS  . insulin aspart  0-9 Units Subcutaneous TID WC  . latanoprost  1 drop Both Eyes QHS  . metoprolol tartrate  50 mg Oral BID  . nystatin   Topical BID  . polyethylene glycol  17 g Oral Daily  . senna-docusate  1 tablet Oral BID  . simvastatin  5 mg Oral q1800    Abtx:  Anti-infectives (From admission, onward)   Start     Dose/Rate Route Frequency Ordered Stop   01/28/18 2200  ceFAZolin (ANCEF) IVPB 1 g/50 mL premix  Status:  Discontinued     1 g 100 mL/hr over 30 Minutes Intravenous Every 24 hours 01/28/18 1007  01/28/18 1231   01/27/18 2245  ceFAZolin (ANCEF) IVPB 2g/100 mL premix  Status:  Discontinued     2 g 200 mL/hr over 30 Minutes Intravenous Every 12 hours 01/27/18 2243 01/28/18 1007   01/27/18 2230  ceFAZolin (ANCEF) IVPB  Status:  Discontinued    Note to Pharmacy:  Indication:  MSSA bacteremia/endocarditis Last Day of Therapy:  03/08/18 Labs - Once weekly:  CBC/D and BMP, Labs - Every other week:  ESR and CRP     2 g Intravenous Every 8 hours 01/27/18 2220 01/27/18 2227   01/27/18 2230  ceFAZolin (ANCEF) IVPB 2g/100 mL premix  Status:  Discontinued     2 g 200 mL/hr over 30 Minutes Intravenous Every 8 hours 01/27/18 2227 01/27/18 2243        OBJECTIVE: Blood pressure 139/66, pulse 83, temperature 97.9 F (36.6 C), temperature source Oral, resp. rate 18, height '5\' 10"'  (1.778 m), weight 95.2 kg, SpO2 100 %.  Physical Exam  Constitutional: He appears lethargic. No distress.  HENT:  Mouth/Throat: Oropharyngeal exudate present.  Eyes: Pupils are equal, round, and reactive to light. EOM are normal.  Neck: Normal range of motion. Neck supple.  Cardiovascular: Normal rate, regular rhythm and normal heart sounds.  Pulmonary/Chest: Effort normal and breath sounds normal.  Abdominal: Soft. Bowel sounds are normal. He exhibits distension. There is no tenderness. There is no guarding.  Musculoskeletal:       Arms:      Legs: Lymphadenopathy:    He has no cervical adenopathy.  Neurological: He appears lethargic.  Skin: He is not diaphoretic.    Lab Results Results for orders placed or performed during the hospital encounter of 01/27/18 (from the past 48 hour(s))  Urinalysis, Routine w reflex microscopic     Status: Abnormal   Collection Time: 01/27/18 10:34 AM  Result Value Ref Range   Color, Urine AMBER (A) YELLOW    Comment: BIOCHEMICALS MAY BE AFFECTED BY COLOR   APPearance TURBID (A) CLEAR   Specific Gravity, Urine 1.017 1.005 - 1.030   pH 6.0 5.0 - 8.0   Glucose, UA  NEGATIVE NEGATIVE mg/dL   Hgb urine dipstick MODERATE (A) NEGATIVE   Bilirubin Urine NEGATIVE NEGATIVE   Ketones, ur NEGATIVE NEGATIVE mg/dL   Protein, ur >=300 (A) NEGATIVE mg/dL   Nitrite NEGATIVE NEGATIVE  Leukocytes, UA MODERATE (A) NEGATIVE   RBC / HPF >50 (H) 0 - 5 RBC/hpf   WBC, UA >50 (H) 0 - 5 WBC/hpf   Bacteria, UA NONE SEEN NONE SEEN   WBC Clumps PRESENT    Budding Yeast PRESENT    Non Squamous Epithelial 6-10 (A) NONE SEEN    Comment: Performed at [facility], [place] [address], [place], [place] [address]  Sodium, urine, random     Status: None   Collection Time: 01/27/18 10:34 AM  Result Value Ref Range   Sodium, Ur 124 mmol/L    Comment: Performed at [facility] [address] [place], [place] [address]  Creatinine, urine, random     Status: None   Collection Time: 01/27/18 10:34 AM  Result Value Ref Range   Creatinine, Urine 36.63 mg/dL    Comment: Performed at [facility] [address] [place], [place] [address]  CBC     Status: Abnormal   Collection Time: 01/27/18  6:19 PM  Result Value Ref Range   WBC 18.6 (H) 4.0 - 10.5 K/uL   RBC 4.60 4.22 - 5.81 MIL/uL   Hemoglobin 13.2 13.0 - 17.0 g/dL   HCT 39.7 39.0 - 52.0 %   MCV 86.3 78.0 - 100.0 fL   MCH 28.7 26.0 - 34.0 pg   MCHC 33.2 30.0 - 36.0 g/dL   RDW 14.0 11.5 - 15.5 %   Platelets 165 150 - 400 K/uL    Comment: Performed at [facility], [place] [address], [place], [place] [address]  Comprehensive metabolic panel     Status: Abnormal   Collection Time: 01/27/18  6:19 PM  Result Value Ref Range   Sodium 128 (L) 135 - 145 mmol/L   Potassium 4.6 3.5 - 5.1 mmol/L   Chloride 94 (L) 98 - 111 mmol/L   CO2 13 (L) 22 - 32 mmol/L   Glucose, Bld 53 (L) 70 - 99 mg/dL   BUN 84 (H) 8 - 23 mg/dL   Creatinine, Ser 9.60 (H) 0.61 - 1.24 mg/dL   Calcium 6.6 (L) 8.9 - 10.3 mg/dL   Total Protein 5.8 (L) 6.5 - 8.1 g/dL   Albumin 2.2 (L) 3.5 - 5.0 g/dL   AST 19 15 - 41 U/L   ALT <5 0 - 44 U/L     Alkaline Phosphatase 66 38 - 126 U/L   Total Bilirubin 0.5 0.3 - 1.2 mg/dL   GFR calc non Af Amer 5 (L) >60 mL/min   GFR calc Af Amer 6 (L) >60 mL/min    Comment: (NOTE) The eGFR has been calculated using the CKD EPI equation. This calculation has not been validated in all clinical situations. eGFR's persistently <60 mL/min signify possible Chronic Kidney Disease.    Anion gap 21 (H) 5 - 15    Comment: Performed at [facility], [place] [address] [place], [place] [address]  CBG monitoring, ED     Status: Abnormal   Collection Time: 01/27/18  7:17 PM  Result Value Ref Range   Glucose-Capillary 64 (L) 70 - 99 mg/dL  CBG monitoring, ED     Status: None   Collection Time: 01/27/18  9:24 PM  Result Value Ref Range   Glucose-Capillary 85 70 - 99 mg/dL  CBG monitoring, ED     Status: None   Collection Time: 01/27/18 11:38 PM  Result Value Ref Range   Glucose-Capillary 84 70 - 99 mg/dL  CBC     Status: Abnormal   Collection Time: 01/28/18 12:59 AM  Result Value Ref Range   WBC 15.5 (H) 4.0 - 10.5 K/uL   RBC 4.37 4.22 - 5.81 MIL/uL   Hemoglobin 12.6 (L) 13.0 - 17.0 g/dL   HCT 36.8 (L) 39.0 - 52.0 %   MCV 84.2 78.0 - 100.0 fL   MCH 28.8 26.0 - 34.0 pg   MCHC 34.2 30.0 - 36.0 g/dL   RDW 13.9 11.5 - 15.5 %   Platelets 146 (L) 150 - 400 K/uL    Comment: Performed at [facility] [address] [place], [place] [address]  Basic metabolic panel     Status: Abnormal   Collection Time: 01/28/18 12:59 AM  Result Value Ref Range   Sodium 129 (L) 135 - 145 mmol/L   Potassium 4.9 3.5 - 5.1 mmol/L   Chloride 96 (L) 98 - 111 mmol/L   CO2 12 (L) 22 - 32 mmol/L   Glucose, Bld 123 (H) 70 - 99 mg/dL   BUN 88 (H) 8 - 23 mg/dL   Creatinine, Ser 9.60 (H) 0.61 - 1.24 mg/dL   Calcium 6.5 (L) 8.9 - 10.3 mg/dL   GFR calc non Af Amer 5 (L) >60 mL/min   GFR calc Af Amer 6 (L) >60 mL/min    Comment: (NOTE) The eGFR has been calculated using the CKD EPI equation. This calculation has not  been validated in all clinical situations. eGFR's persistently <60 mL/min signify possible Chronic Kidney Disease.    Anion gap 21 (H) 5 - 15    Comment: RESULT CHECKED Performed at [facility], [place] [address] [place], [place] [address]   Glucose, capillary     Status: Abnormal   Collection Time: 01/28/18  7:38 AM  Result Value Ref Range   Glucose-Capillary 61 (L) 70 - 99 mg/dL  Glucose, capillary     Status: None   Collection Time: 01/28/18 11:51 AM  Result Value Ref Range   Glucose-Capillary 77 70 - 99 mg/dL      Component Value Date/Time   SDES BLOOD RIGHT ARM 01/15/2018 1221   SPECREQUEST  01/15/2018 1221    BOTTLES DRAWN AEROBIC ONLY Blood Culture results may not be optimal due to an inadequate volume of blood received in culture bottles   CULT  01/15/2018 1221    NO GROWTH 5 DAYS Performed at [facility], [place] [address], [place], [place] [address]    REPTSTATUS 01/20/2018 FINAL 01/15/2018 1221   Dg Abd 1 View  Result Date: 01/28/2018 CLINICAL DATA:  Abdominal pain EXAM: ABDOMEN - 1 VIEW COMPARISON:  CT abdomen pelvis of 01/14/2018 FINDINGS: A supine film of the abdomen shows the lumbar spine to be somewhat rotated to the right. No bowel obstruction is seen. However there is paucity of bowel gas in the left abdomen of doubtful significance in view of the recent CT scan showing no mass. Calcified vas deferens are noted in the pelvis. IMPRESSION: 1. No bowel obstruction. 2. Paucity of bowel gas in the left abdomen of doubtful significance. 3. Calcified vas deferens. Electronically Signed   By: Ivar Drape M.D.   On: 01/28/2018 11:24   US Renal  Result Date: 01/27/2018 CLINICAL DATA:  62 year old male with acute renal failure. EXAM: RENAL / URINARY TRACT ULTRASOUND COMPLETE COMPARISON:  CT of abdomen pelvis dated 01/14/2018 FINDINGS: Right Kidney: Length: 12.2 cm. Normal echogenicity. No hydronephrosis or shadowing stone. Left Kidney: Length: 11.7  cm. Normal  echogenicity. No hydronephrosis or shadowing stone. Bladder: There is layering debris in the urinary bladder. Correlation with urinalysis recommended. The liver is slightly echogenic likely fatty infiltration. There is probable Riedel's lobe anatomy. IMPRESSION: 1. No hydronephrosis or shadowing stone. 2. Layering debris within the urinary bladder. Correlation with urinalysis recommended. Electronically Signed   By: Anner Crete M.D.   On: 01/27/2018 22:38   Dg Chest Portable 1 View  Result Date: 01/27/2018 CLINICAL DATA:  Intermittent LEFT chest pain.  Oliguric. EXAM: PORTABLE CHEST 1 VIEW COMPARISON:  Chest radiograph January 15, 2018 FINDINGS: RIGHT PICC distal tip projects in distal superior vena cava. No pneumothorax. Stable mild cardiomegaly. Status post aortic valve replacement. LEFT mid lung zone bandlike density with mildly elevated LEFT hemidiaphragm. No focal consolidation. Blunting of the LEFT costophrenic angle. No pneumothorax. Soft tissue planes and included osseous structures are non suspicious. Surgical clips bilateral axilla. IMPRESSION: 1. RIGHT PICC distal tip projecting in distal superior vena cava. No pneumothorax. 2. LEFT lung base atelectasis with small pleural effusion versus pleural thickening. 3. Stable cardiomegaly. Electronically Signed   By: Elon Alas M.D.   On: 01/27/2018 18:43   No results found for this or any previous visit (from the past 240 hour(s)).  Microbiology: No results found for this or any previous visit (from the past 240 hour(s)).  Radiographs and labs were personally reviewed by me.   Bobby Rumpf, MD [facility] [phone] 01/28/2018, 5:04 PM

## 2018-01-28 NOTE — Progress Notes (Addendum)
Pharmacy Antibiotic Note  [name] is a 62 y.o. male admitted on 01/27/2018 with bacteremia.  Pharmacy has been consulted for cefazolin dosing. Pt admitted with AKI. Of note, patient was on cefazolin prior to admission for staph aureus bacteremia with vegetation on ECHO.   Pt with no UOP since admission and reports little urination 2-3 pre-admission.  Plan: -Decrease cefazolin to 1 gm IV q 24 hours -Monitor renal function and adjust abx accordingly   Height: 5\' 10"  (177.8 cm) Weight: 209 lb 14.1 oz (95.2 kg) IBW/kg (Calculated) : 73  Temp (24hrs), Avg:97.6 F (36.4 C), Min:97.5 F (36.4 C), Max:97.9 F (36.6 C)  Recent Labs  Lab 01/22/18 0446 01/27/18 1819 01/28/18 0059  WBC 14.1* 18.6* 15.5*  CREATININE 1.13 9.60* 9.60*    Estimated Creatinine Clearance: 9.4 mL/min (A) (by C-G formula based on SCr of 9.6 mg/dL (H)).    Allergies  Allergen Reactions  . Ace Inhibitors Cough     Thank you for allowing pharmacy to be a part of this patient's care.  Sherlon Handing, PharmD, BCPS Clinical pharmacist  **Pharmacist phone directory can now be found on [email or web address] ([email or web address]).  Listed under [place]. 01/28/2018 10:06 AM

## 2018-01-28 NOTE — Progress Notes (Addendum)
Inpatient Diabetes Program Recommendations  AACE/ADA: New Consensus Statement on Inpatient Glycemic Control (2015)  Target Ranges:  Prepandial:   less than 140 mg/dL      Peak postprandial:   less than 180 mg/dL (1-2 hours)      Critically ill patients:  140 - 180 mg/dL   Lab Results  Component Value Date   GLUCAP 61 (L) 01/28/2018   HGBA1C 8.5 (H) 01/16/2018    Review of Glycemic ControlResults for CHIBUIKE, FLEEK (MRN [id number]) as of 01/28/2018 10:56  Ref. Range 01/27/2018 19:17 01/27/2018 21:24 01/27/2018 23:38 01/28/2018 07:38  Glucose-Capillary Latest Ref Range: 70 - 99 mg/dL 64 (L) 85 84 61 (L)    Diabetes history: Type 2 DM  Outpatient Diabetes medications: Toujeo 30 units daily, Trulicity 1.5 mg daily, Glucotrol 10 mg daily, Novolog with meals Current orders for Inpatient glycemic control: Novolog sensitive tid with meals and HS  Inpatient Diabetes Program Recommendations:   Note hypoglycemia.  Patient has received no insulin since coming to the hospital.  He was on Toujeo (basal insulin) at SNF which may be contributing to low blood sugars.  Agree with current orders.   Thanks,  Adah Perl, RN, BC-ADM Inpatient Diabetes Coordinator Pager [phone] (8a-5p)

## 2018-01-28 NOTE — Progress Notes (Signed)
New Admission Note:  Arrival Method: Stretcher Mental Orientation: Alert and oriented x 4 Telemetry: N/A Assessment: Completed Skin: Excoriation left hip, groin and perineum. MSAD  IV: PICC RT upper arm  Pain: Denies  Tubes: Wound vac Safety Measures: Safety Fall Prevention Plan initiated.  Admission: Completed 5 M  Orientation: Patient has been orientated to the room, unit and the staff. Welcome booklet given.  Family: None  Orders have been reviewed and implemented. Will continue to monitor the patient. Call light has been placed within reach and bed alarm has been activated.   Sima Matas BSN, RN  Phone Number: [phone]

## 2018-01-28 NOTE — Consult Note (Signed)
Referring Provider: No ref. provider found Primary Care Physician:  [name], MD Primary Nephrologist:  Dr.   Luiz Iron for Consultation:    HPI: This is a delightful gentleman with a history of diabetes coronary artery disease hypertension and a recent history of MSSA bacteremia and tricuspid valve endocarditis.  He was hospitalized in September with right foot osteomyelitis and underwent BKA at that time he was found to have MSSA bacteremia splenic infarct and tricuspid valve endocarditis.  He was discharged to SNF and was given intravenous Ancef.  He was brought back to the emergency room from the nursing home due to decreased urine output and some suprapubic discomfort.  He was found to have an increase in his serum creatinine to 9.6.  A bladder scan did not reveal any urine.  Reviewing his medications from the nursing home there is no evidence of any use of nonsteroidal anti-inflammatory drugs ACE inhibitors ARB's.   Renal ultrasound did not reveal any hydronephrosis although there was some debris in the bladder.   Urine sodium was 124.  Hemodynamically he is stable with a blood pressure 120/80.  There is some urine output although it is very small this morning.  He has a total of 2 L in over the last 24 hours.  Past Medical History:  Diagnosis Date  . Bicuspid aortic valve    a. 02/2009 s/p tissue AVR;  b. 07/2014 Echo: EF 55-60%, basal-mid inf HK, mild AS/MR, mod dil LA, PASP 44 mmHg.  Marland Kitchen CAD (coronary artery disease) October 2010   a. 02/2009 NSTEMI/Cath: 3VD; b. 02/2009 CABG x 5: LIMA->LAD, VG->Diag, VG->RI, VG->RVM->RPDA.  Marland Kitchen GERD (gastroesophageal reflux disease)   . Heart murmur   . Hyperlipidemia   . Hypertension   . Myocardial infarction ([place]) 2010  . PVD (peripheral vascular disease) ([place])    a. s/p toe amputations on L foot.  . Type II diabetes mellitus ([place])     Past Surgical History:  Procedure Laterality Date  . AMPUTATION Left 2011   great and second toes  .  AMPUTATION  07/27/2011   Procedure: AMPUTATION FOOT;  Surgeon: Newt Minion, MD;  Location: [facility];  Service: Orthopedics;  Laterality: Left;  Left Midfoot Amputation   . AMPUTATION Left 12/08/2014   Procedure: Left Below Knee Amputation;  Surgeon: Newt Minion, MD;  Location: [facility];  Service: Orthopedics;  Laterality: Left;  . AMPUTATION Right 01/17/2018   Procedure: RIGHT BELOW KNEE AMPUTATION;  Surgeon: Newt Minion, MD;  Location: [facility];  Service: Orthopedics;  Laterality: Right;  . AMPUTATION TOE Right    "all my toes on my right foot have been amputated"  . AORTIC VALVE REPLACEMENT  2010   Pericardial tissue valve  . CARDIAC CATHETERIZATION  2010  . CARDIAC VALVE REPLACEMENT    . CATARACT EXTRACTION W/ INTRAOCULAR LENS  IMPLANT, BILATERAL Bilateral   . CORONARY ARTERY BYPASS GRAFT  2010   "CABG X5"  . TEE WITHOUT CARDIOVERSION N/A 01/21/2018   Procedure: TRANSESOPHAGEAL ECHOCARDIOGRAM (TEE);  Surgeon: Buford Dresser, MD;  Location: [facility];  Service: Cardiovascular;  Laterality: N/A;    Prior to Admission medications   Medication Sig Start Date End Date Taking? Authorizing Provider  aspirin 325 MG tablet Take 325 mg by mouth daily.     Yes [provider]  bisacodyl (DULCOLAX) 10 MG suppository Place 10 mg rectally once as needed (for constipation not relieved by Milk of Magnesia).    Yes [provider]  Calcium  Citrate-Vitamin D (CALCIUM CITRATE + D3) 250-200 MG-UNIT TABS Take 1 tablet by mouth 2 (two) times daily.   Yes [provider]  ceFAZolin (ANCEF) IVPB Inject 2 g into the vein every 8 (eight) hours. Indication:  MSSA bacteremia/endocarditis Last Day of Therapy:  03/08/18 Labs - Once weekly:  CBC/D and BMP, Labs - Every other week:  ESR and CRP 01/22/18 03/09/18 Yes Florencia Reasons, MD  cetirizine (ZYRTEC) 10 MG tablet Take 10 mg by mouth daily.   Yes [provider]  Dulaglutide (TRULICITY) 1.5 EM/3.3KP SOPN Inject 1.5 mg into the  skin every Monday.    Yes [provider]  glipiZIDE (GLUCOTROL XL) 10 MG 24 hr tablet Take 10 mg by mouth daily.  04/02/16  Yes [provider]  insulin aspart (NOVOLOG) 100 UNIT/ML injection Before each meal 3 times a day, 140-199 - 2 units, 200-250 - 4 units, 251-299 - 6 units,  300-349 - 8 units,  350 or above 10 units. Insulin PEN if approved, provide syringes and needles if needed. Patient taking differently: Inject 2-10 Units into the skin See admin instructions. Inject 2-10 units into the skin three times a day before meals, per sliding scale: BGL 140-199 = 2 units; 200-250 = 4 units; 251-299 = 6 units; 300-349 = 8 units; 350 or greater = 10 units 01/22/18  Yes Florencia Reasons, MD  ketoconazole (NIZORAL) 2 % cream Apply 1 application topically See admin instructions. Apply to both buttocks and "GL fold" rash two times a day   Yes [provider]  latanoprost (XALATAN) 0.005 % ophthalmic solution Place 1 drop into both eyes at bedtime. 07/13/14  Yes [provider]  losartan (COZAAR) 50 MG tablet Take 50 mg by mouth daily.  07/19/14  Yes [provider]  magnesium hydroxide (MILK OF MAGNESIA) 400 MG/5ML suspension Take 30 mLs by mouth once as needed for mild constipation.   Yes [provider]  metoprolol (LOPRESSOR) 50 MG tablet TAKE 2 TABLETS BY MOUTH EVERY MORNING AND 1 EVERY EVENING Patient taking differently: Take 50-100 mg by mouth See admin instructions. Take 100 mg by mouth in the morning and 50 mg in the evening 12/30/15  Yes Fenton Malling M, PA-C  Multiple Vitamin (MULTIVITAMIN) tablet Take 1 tablet by mouth daily.     Yes [provider]  naproxen sodium (ANAPROX) 220 MG tablet Take 220 mg by mouth 2 (two) times daily as needed (pain).   Yes [provider]  oxyCODONE (OXY IR/ROXICODONE) 5 MG immediate release tablet Take 1 tablet (5 mg total) by mouth every 4 (four) hours as needed for breakthrough pain (pain score  4-6). Patient taking differently: Take 5 mg by mouth every 4 (four) hours as needed for breakthrough pain (for pain).  01/22/18  Yes Florencia Reasons, MD  pantoprazole (PROTONIX) 40 MG tablet Take 40 mg by mouth daily.     Yes [provider]  polyethylene glycol (MIRALAX / GLYCOLAX) packet Take 17 g by mouth daily as needed for mild constipation. 01/22/18  Yes Florencia Reasons, MD  simvastatin (ZOCOR) 20 MG tablet Take 20 mg by mouth daily at 6 PM.  08/17/14  Yes [provider]  Sodium Phosphates (RA SALINE ENEMA) 19-7 GM/118ML ENEM Place 1 enema rectally once as needed (for constipation not relieved by Dulcolax suppository and notify MD if no relief from enema).   Yes [provider]  TOUJEO SOLOSTAR 300 UNIT/ML SOPN Inject 30 Units into the skin See admin instructions.  Use up to 100 units daily Patient taking differently: Inject 30 Units into the skin daily.  01/22/18  Yes Florencia Reasons, MD  furosemide (LASIX) 40 MG tablet Take 40 mg by mouth 2 (two) times daily.    07/23/11  [provider]    Current Facility-Administered Medications  Medication Dose Route Frequency Provider Last Rate Last Dose  . acetaminophen (TYLENOL) tablet 650 mg  650 mg Oral Q6H PRN Opyd, Ilene Qua, MD   650 mg at 01/28/18 0022   Or  . acetaminophen (TYLENOL) suppository 650 mg  650 mg Rectal Q6H PRN Opyd, Ilene Qua, MD      . ceFAZolin (ANCEF) IVPB 1 g/50 mL premix  1 g Intravenous Q24H Franky Macho, RPH      . heparin injection 5,000 Units  5,000 Units Subcutaneous Q8H Opyd, Ilene Qua, MD   5,000 Units at 01/28/18 0617  . insulin aspart (novoLOG) injection 0-5 Units  0-5 Units Subcutaneous QHS Opyd, Timothy S, MD      . insulin aspart (novoLOG) injection 0-9 Units  0-9 Units Subcutaneous TID WC Opyd, Timothy S, MD      . latanoprost (XALATAN) 0.005 % ophthalmic solution 1 drop  1 drop Both Eyes QHS Opyd, Timothy S, MD      . metoprolol tartrate (LOPRESSOR) tablet 50 mg  50 mg Oral BID Opyd, Ilene Qua,  MD   50 mg at 01/28/18 1020  . ondansetron (ZOFRAN) tablet 4 mg  4 mg Oral Q6H PRN Opyd, Ilene Qua, MD       Or  . ondansetron (ZOFRAN) injection 4 mg  4 mg Intravenous Q6H PRN Opyd, Ilene Qua, MD      . polyethylene glycol (MIRALAX / GLYCOLAX) packet 17 g  17 g Oral Daily Florencia Reasons, MD      . senna-docusate (Senokot-S) tablet 1 tablet  1 tablet Oral BID Florencia Reasons, MD   1 tablet at 01/28/18 1020  . simvastatin (ZOCOR) tablet 5 mg  5 mg Oral q1800 Opyd, Timothy S, MD      . sodium chloride flush (NS) 0.9 % injection 10-40 mL  10-40 mL Intracatheter PRN Opyd, Ilene Qua, MD        Allergies as of 01/27/2018 - Review Complete 01/27/2018  Allergen Reaction Noted  . Ace inhibitors Cough     Family History  Problem Relation Age of Onset  . Heart attack Father        early age  . Heart disease Father   . Stroke Father   . Heart attack Brother        At age 16  . Stroke Brother   . Diabetes type II Mother   . Diabetes type II Sister   . Diabetes Unknown   . Coronary artery disease Unknown     Social History   Socioeconomic History  . Marital status: Single    Spouse name: Not on file  . Number of children: Not on file  . Years of education: Not on file  . Highest education level: Not on file  Occupational History  . Occupation: Publishing rights manager: [employer]  . Financial resource strain: Not on file  . Food insecurity:    Worry: Not on file    Inability: Not on file  . Transportation needs:    Medical: Not on file    Non-medical: Not on file  Tobacco Use  . Smoking status: Former Smoker  Packs/day: 1.00    Years: 24.00    Pack years: 24.00    Types: Cigarettes    Last attempt to quit: 10/15/2000    Years since quitting: 17.2  . Smokeless tobacco: Never Used  Substance and Sexual Activity  . Alcohol use: Yes    Comment: 01/15/2018 "1-2 drinks/month"  . Drug use: No  . Sexual activity: Not Currently  Lifestyle  . Physical  activity:    Days per week: Not on file    Minutes per session: Not on file  . Stress: Not on file  Relationships  . Social connections:    Talks on phone: Not on file    Gets together: Not on file    Attends religious service: Not on file    Active member of club or organization: Not on file    Attends meetings of clubs or organizations: Not on file    Relationship status: Not on file  . Intimate partner violence:    Fear of current or ex partner: Not on file    Emotionally abused: Not on file    Physically abused: Not on file    Forced sexual activity: Not on file  Other Topics Concern  . Not on file  Social History Narrative   The patient lives alone in [place].   He is on a diabetic diet with regular exercise at work, but little outside of his work.    Review of Systems: Gen: Denies any fever, chills, sweats, positive for anorexia, positive for fatigue, + weakness, + malaise,  HEENT: No visual complaints, No history of Retinopathy. Normal external appearance No Epistaxis or Sore throat. No sinusitis.   CV: Denies chest pain, angina, palpitations, syncope, orthopnea, PND, peripheral edema, and claudication. Resp: Denies dyspnea at rest, dyspnea with exercise, cough, sputum, wheezing, coughing up blood, and pleurisy. GI: Denies vomiting blood, jaundice, and fecal incontinence.   Denies dysphagia or odynophagia. GU : Diminished urine output suprapubic pain MS: Denies joint pain status post bilateral amputation, atrophy.  No use of non steroidal antiinflammatory drugs. Derm: Denies rash, itching, dry skin, hives, moles, warts, or unhealing ulcers.  Psych: Denies depression, anxiety, memory loss, suicidal ideation, hallucinations, paranoia, and confusion. Heme: Denies bruising, bleeding, and enlarged lymph nodes. Neuro: No headache.  No diplopia. No dysarthria.  No dysphasia.  No history of CVA.  No Seizures. No paresthesias.  No weakness.   Physical Exam: Vital signs in last  24 hours: Temp:  [97.5 F (36.4 C)-97.9 F (36.6 C)] 97.9 F (36.6 C) (09/24 0734) Pulse Rate:  [77-86] 83 (09/24 0734) Resp:  [17-24] 18 (09/24 0734) BP: (127-159)/(66-93) 139/66 (09/24 0734) SpO2:  [94 %-100 %] 100 % (09/24 0734) Weight:  [95.2 kg] 95.2 kg (09/23 1803) Last BM Date: 01/25/18 General:   Chronically ill-appearing Head:  Normocephalic and atraumatic. Eyes:  Sclera clear, no icterus.   Conjunctiva pink. Ears:  Normal auditory acuity. Nose:  No deformity, discharge,  or lesions. Mouth:  No deformity or lesions, dentition normal. Neck:  Supple; no masses or thyromegaly. JVP not elevated Lungs:  Clear throughout to auscultation.   No wheezes, crackles, or rhonchi. No acute distress. Heart:  Regular rate and rhythm; no murmurs, clicks, rubs,  or gallops. Abdomen:  Soft, nontender and nondistended. No masses, hepatosplenomegaly or hernias noted. Normal bowel sounds, without guarding, and without rebound.   Msk:  Symmetrical without gross deformities. Normal posture. Pulses:  No carotid, renal, femoral bruits. DP and PT symmetrical and equal Extremities:  Without clubbing or edema.  Status post amputation bilaterally.  Has shrinking sleeve of left leg Neurologic:  Alert and  oriented x4;  grossly normal neurologically. Skin:  Intact without significant lesions or rashes. Cervical Nodes:  No significant cervical adenopathy. Psych:  Alert and cooperative. Normal mood and affect.  Intake/Output from previous day: 09/23 0701 - 09/24 0700 In: 1805.6 [I.V.:705.6; IV Piggyback:1100] Out: 0  Intake/Output this shift: Total I/O In: 220 [P.O.:220] Out: 50 [Urine:50]  Lab Results: Recent Labs    01/27/18 1819 01/28/18 0059  WBC 18.6* 15.5*  HGB 13.2 12.6*  HCT 39.7 36.8*  PLT 165 146*   BMET Recent Labs    01/27/18 1819 01/28/18 0059  NA 128* 129*  K 4.6 4.9  CL 94* 96*  CO2 13* 12*  GLUCOSE 53* 123*  BUN 84* 88*  CREATININE 9.60* 9.60*  CALCIUM 6.6* 6.5*    LFT Recent Labs    01/27/18 1819  PROT 5.8*  ALBUMIN 2.2*  AST 19  ALT <5  ALKPHOS 66  BILITOT 0.5   PT/INR No results for input(s): LABPROT, INR in the last 72 hours. Hepatitis Panel No results for input(s): HEPBSAG, HCVAB, HEPAIGM, HEPBIGM in the last 72 hours.  Studies/Results: Dg Abd 1 View  Result Date: 01/28/2018 CLINICAL DATA:  Abdominal pain EXAM: ABDOMEN - 1 VIEW COMPARISON:  CT abdomen pelvis of 01/14/2018 FINDINGS: A supine film of the abdomen shows the lumbar spine to be somewhat rotated to the right. No bowel obstruction is seen. However there is paucity of bowel gas in the left abdomen of doubtful significance in view of the recent CT scan showing no mass. Calcified vas deferens are noted in the pelvis. IMPRESSION: 1. No bowel obstruction. 2. Paucity of bowel gas in the left abdomen of doubtful significance. 3. Calcified vas deferens. Electronically Signed   By: Ivar Drape M.D.   On: 01/28/2018 11:24   US Renal  Result Date: 01/27/2018 CLINICAL DATA:  62 year old male with acute renal failure. EXAM: RENAL / URINARY TRACT ULTRASOUND COMPLETE COMPARISON:  CT of abdomen pelvis dated 01/14/2018 FINDINGS: Right Kidney: Length: 12.2 cm. Normal echogenicity. No hydronephrosis or shadowing stone. Left Kidney: Length: 11.7 cm. Normal echogenicity. No hydronephrosis or shadowing stone. Bladder: There is layering debris in the urinary bladder. Correlation with urinalysis recommended. The liver is slightly echogenic likely fatty infiltration. There is probable Riedel's lobe anatomy. IMPRESSION: 1. No hydronephrosis or shadowing stone. 2. Layering debris within the urinary bladder. Correlation with urinalysis recommended. Electronically Signed   By: Anner Crete M.D.   On: 01/27/2018 22:38   Dg Chest Portable 1 View  Result Date: 01/27/2018 CLINICAL DATA:  Intermittent LEFT chest pain.  Oliguric. EXAM: PORTABLE CHEST 1 VIEW COMPARISON:  Chest radiograph January 15, 2018  FINDINGS: RIGHT PICC distal tip projects in distal superior vena cava. No pneumothorax. Stable mild cardiomegaly. Status post aortic valve replacement. LEFT mid lung zone bandlike density with mildly elevated LEFT hemidiaphragm. No focal consolidation. Blunting of the LEFT costophrenic angle. No pneumothorax. Soft tissue planes and included osseous structures are non suspicious. Surgical clips bilateral axilla. IMPRESSION: 1. RIGHT PICC distal tip projecting in distal superior vena cava. No pneumothorax. 2. LEFT lung base atelectasis with small pleural effusion versus pleural thickening. 3. Stable cardiomegaly. Electronically Signed   By: Elon Alas M.D.   On: 01/27/2018 18:43    Assessment/Plan:  Acute renal failure.  Baseline renal function within normal range 1.09 01/17/2018.  The acute renal failure appears to  have occurred while the patient was in the nursing facility.  There does not appear to be the administration of any nephrotoxic agents to the patient.  Renal ultrasound does not reveal any evidence of obstruction.  I think gentle hydration would be indicated in this situation although the patient does not look overtly dehydrated to me.  This may be acute interstitial nephritis secondary to the use of Ancef.  I would discontinue this medication at this time.  I will check a urinalysis and urine microscopy to evaluate for urine white cells.  I lack any other diagnoses at this time.  Hypertension appears adequately controlled this seems to be no hemodynamic variability at this time  Diabetes mellitus type 2 per primary team  Peripheral vascular disease status post left BKA August 2016 and right BKA to September 2019  Aortic valve replacement 2010 pericardial tissue valve  Coronary artery bypass graft times 09/2008  Hyperlipidemia on statins   LOS: [place] '@TODAY' '@12' :14 PM

## 2018-01-29 ENCOUNTER — Inpatient Hospital Stay (HOSPITAL_COMMUNITY): Payer: Medicare Other

## 2018-01-29 ENCOUNTER — Inpatient Hospital Stay (INDEPENDENT_AMBULATORY_CARE_PROVIDER_SITE_OTHER): Payer: BLUE CROSS/BLUE SHIELD | Admitting: Family

## 2018-01-29 DIAGNOSIS — R0602 Shortness of breath: Secondary | ICD-10-CM

## 2018-01-29 DIAGNOSIS — J9601 Acute respiratory failure with hypoxia: Secondary | ICD-10-CM

## 2018-01-29 DIAGNOSIS — R140 Abdominal distension (gaseous): Secondary | ICD-10-CM

## 2018-01-29 DIAGNOSIS — R0603 Acute respiratory distress: Secondary | ICD-10-CM

## 2018-01-29 LAB — CBC WITH DIFFERENTIAL/PLATELET
Abs Immature Granulocytes: 0.1 10*3/uL (ref 0.0–0.1)
BASOS ABS: 0 10*3/uL (ref 0.0–0.1)
BASOS PCT: 0 %
EOS PCT: 0 %
Eosinophils Absolute: 0 10*3/uL (ref 0.0–0.7)
HCT: 32.8 % — ABNORMAL LOW (ref 39.0–52.0)
Hemoglobin: 10.8 g/dL — ABNORMAL LOW (ref 13.0–17.0)
Immature Granulocytes: 1 %
LYMPHS PCT: 5 %
Lymphs Abs: 0.6 10*3/uL — ABNORMAL LOW (ref 0.7–4.0)
MCH: 28.3 pg (ref 26.0–34.0)
MCHC: 32.9 g/dL (ref 30.0–36.0)
MCV: 86.1 fL (ref 78.0–100.0)
MONO ABS: 1.4 10*3/uL — AB (ref 0.1–1.0)
Monocytes Relative: 11 %
NEUTROS ABS: 11 10*3/uL — AB (ref 1.7–7.7)
Neutrophils Relative %: 83 %
PLATELETS: 132 10*3/uL — AB (ref 150–400)
RBC: 3.81 MIL/uL — ABNORMAL LOW (ref 4.22–5.81)
RDW: 14.2 % (ref 11.5–15.5)
WBC: 13.2 10*3/uL — AB (ref 4.0–10.5)

## 2018-01-29 LAB — RENAL FUNCTION PANEL
Albumin: 2.1 g/dL — ABNORMAL LOW (ref 3.5–5.0)
Anion gap: 18 — ABNORMAL HIGH (ref 5–15)
BUN: 93 mg/dL — AB (ref 8–23)
CO2: 13 mmol/L — AB (ref 22–32)
CREATININE: 10.14 mg/dL — AB (ref 0.61–1.24)
Calcium: 6.3 mg/dL — CL (ref 8.9–10.3)
Chloride: 99 mmol/L (ref 98–111)
GFR calc Af Amer: 6 mL/min — ABNORMAL LOW (ref >60)
GFR calc non Af Amer: 5 mL/min — ABNORMAL LOW (ref >60)
GLUCOSE: 65 mg/dL — AB (ref 70–99)
Phosphorus: 10.1 mg/dL — ABNORMAL HIGH (ref 2.5–4.6)
Potassium: 5.1 mmol/L (ref 3.5–5.1)
SODIUM: 130 mmol/L — AB (ref 135–145)

## 2018-01-29 LAB — UREA NITROGEN, URINE: UREA NITROGEN UR: 92 mg/dL

## 2018-01-29 LAB — BASIC METABOLIC PANEL
ANION GAP: 18 — AB (ref 5–15)
Anion gap: 15 (ref 5–15)
BUN: 94 mg/dL — ABNORMAL HIGH (ref 8–23)
BUN: 74 mg/dL — AB (ref 8–23)
CHLORIDE: 101 mmol/L (ref 98–111)
CO2: 12 mmol/L — AB (ref 22–32)
CO2: 16 mmol/L — ABNORMAL LOW (ref 22–32)
Calcium: 5.8 mg/dL — CL (ref 8.9–10.3)
Calcium: 6.8 mg/dL — ABNORMAL LOW (ref 8.9–10.3)
Chloride: 97 mmol/L — ABNORMAL LOW (ref 98–111)
Creatinine, Ser: 10.26 mg/dL — ABNORMAL HIGH (ref 0.61–1.24)
Creatinine, Ser: 8.97 mg/dL — ABNORMAL HIGH (ref 0.61–1.24)
GFR calc Af Amer: 6 mL/min — ABNORMAL LOW (ref >60)
GFR calc non Af Amer: 5 mL/min — ABNORMAL LOW (ref >60)
GFR, EST AFRICAN AMERICAN: 6 mL/min — AB (ref >60)
GFR, EST NON AFRICAN AMERICAN: 6 mL/min — AB (ref >60)
GLUCOSE: 78 mg/dL (ref 70–99)
Glucose, Bld: 67 mg/dL — ABNORMAL LOW (ref 70–99)
POTASSIUM: 4.9 mmol/L (ref 3.5–5.1)
POTASSIUM: 4.3 mmol/L (ref 3.5–5.1)
SODIUM: 127 mmol/L — AB (ref 135–145)
Sodium: 132 mmol/L — ABNORMAL LOW (ref 135–145)

## 2018-01-29 LAB — CBC
HCT: 34.6 % — ABNORMAL LOW (ref 39.0–52.0)
Hemoglobin: 11.6 g/dL — ABNORMAL LOW (ref 13.0–17.0)
MCH: 28.6 pg (ref 26.0–34.0)
MCHC: 33.5 g/dL (ref 30.0–36.0)
MCV: 85.4 fL (ref 78.0–100.0)
PLATELETS: 157 10*3/uL (ref 150–400)
RBC: 4.05 MIL/uL — ABNORMAL LOW (ref 4.22–5.81)
RDW: 14.2 % (ref 11.5–15.5)
WBC: 13.1 10*3/uL — ABNORMAL HIGH (ref 4.0–10.5)

## 2018-01-29 LAB — GLUCOSE, CAPILLARY
GLUCOSE-CAPILLARY: 63 mg/dL — AB (ref 70–99)
GLUCOSE-CAPILLARY: 62 mg/dL — AB (ref 70–99)
Glucose-Capillary: 59 mg/dL — ABNORMAL LOW (ref 70–99)
Glucose-Capillary: 122 mg/dL — ABNORMAL HIGH (ref 70–99)
Glucose-Capillary: 108 mg/dL — ABNORMAL HIGH (ref 70–99)

## 2018-01-29 LAB — URINE CULTURE: CULTURE: NO GROWTH

## 2018-01-29 MED ORDER — LIDOCAINE-PRILOCAINE 2.5-2.5 % EX CREA: 1.0000 "application " | TOPICAL_CREAM | CUTANEOUS | Status: DC | PRN

## 2018-01-29 MED ORDER — GERHARDT'S BUTT CREAM
TOPICAL_CREAM | Freq: Three times a day (TID) | CUTANEOUS | Status: AC
  Administered 2018-01-29 – 2018-02-18 (×42): via TOPICAL
  Filled 2018-01-29 (×3): qty 1

## 2018-01-29 MED ORDER — DEXTROSE 50 % IV SOLN
INTRAVENOUS | Status: AC
  Administered 2018-01-29: 50 mL
  Filled 2018-01-29: qty 50

## 2018-01-29 MED ORDER — CHLORHEXIDINE GLUCONATE CLOTH 2 % EX PADS
6.0000 | MEDICATED_PAD | Freq: Every day | CUTANEOUS | Status: DC
  Administered 2018-01-29 – 2018-02-03 (×6): 6 via TOPICAL

## 2018-01-29 MED ORDER — ALTEPLASE 2 MG IJ SOLR: 2.0000 mg | Freq: Once | INTRAMUSCULAR | Status: DC | PRN

## 2018-01-29 MED ORDER — FLUCONAZOLE 50 MG PO TABS
50.0000 mg | ORAL_TABLET | Freq: Every day | ORAL | Status: AC
  Administered 2018-01-30 – 2018-02-04 (×6): 50 mg via ORAL
  Filled 2018-01-29 (×6): qty 1

## 2018-01-29 MED ORDER — LIDOCAINE HCL (PF) 1 % IJ SOLN: 5.0000 mL | INTRAMUSCULAR | Status: DC | PRN

## 2018-01-29 MED ORDER — SODIUM CHLORIDE 0.9 % IV SOLN: 100.0000 mL | INTRAVENOUS | Status: DC | PRN

## 2018-01-29 MED ORDER — SODIUM CHLORIDE 0.9 % IV SOLN
2.0000 g | Freq: Once | INTRAVENOUS | Status: AC
  Administered 2018-01-29: 2 g via INTRAVENOUS
  Filled 2018-01-29: qty 20

## 2018-01-29 MED ORDER — ORAL CARE MOUTH RINSE
15.0000 mL | Freq: Two times a day (BID) | OROMUCOSAL | Status: DC
  Administered 2018-01-29 – 2018-03-10 (×53): 15 mL via OROMUCOSAL

## 2018-01-29 MED ORDER — PANTOPRAZOLE SODIUM 40 MG PO TBEC
40.0000 mg | DELAYED_RELEASE_TABLET | Freq: Every day | ORAL | Status: DC
  Administered 2018-01-30 – 2018-03-11 (×38): 40 mg via ORAL
  Filled 2018-01-29 (×39): qty 1

## 2018-01-29 MED ORDER — SODIUM CHLORIDE 0.9 % IV SOLN
700.0000 mg | INTRAVENOUS | Status: DC
  Administered 2018-01-29: 700 mg via INTRAVENOUS
  Filled 2018-01-29 (×2): qty 14

## 2018-01-29 MED ORDER — HEPARIN SODIUM (PORCINE) 1000 UNIT/ML DIALYSIS: 1000.0000 [IU] | INTRAMUSCULAR | Status: DC | PRN

## 2018-01-29 MED ORDER — FUROSEMIDE 10 MG/ML IJ SOLN
160.0000 mg | Freq: Once | INTRAVENOUS | Status: AC
  Administered 2018-01-29: 160 mg via INTRAVENOUS
  Filled 2018-01-29: qty 16

## 2018-01-29 MED ORDER — PENTAFLUOROPROP-TETRAFLUOROETH EX AERO: 1.0000 "application " | INHALATION_SPRAY | CUTANEOUS | Status: DC | PRN

## 2018-01-29 MED ORDER — FLUCONAZOLE 100 MG PO TABS
100.0000 mg | ORAL_TABLET | Freq: Once | ORAL | Status: AC
  Administered 2018-01-29: 100 mg via ORAL
  Filled 2018-01-29: qty 1

## 2018-01-29 NOTE — Progress Notes (Signed)
Pharmacy Antibiotic Note  [name] is a 62 y.o. 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 Pharmacy has been consulted for daptomycin dosing. Scr continuing to rise to 10.26 today, estimated CrCl ~9 mL/min. Patient weighs 95.2kg, which is 130% of his IBW of 73kg, so will dose by adjusted body weight.  Plan: Start daptomycin 700 mg (~8mg /kg AdjBW) q48h Watch Scr, renal plans for RRT and adjust dose as appropriate D/c statin while on daptomycin Baseline CK tomorrow AM and weekly thereafter  Height: 5\' 10"  (177.8 cm) Weight: 209 lb 14.1 oz (95.2 kg) IBW/kg (Calculated) : 73  Temp (24hrs), Avg:97.9 F (36.6 C), Min:97.8 F (36.6 C), Max:97.9 F (36.6 C)  Recent Labs  Lab 01/27/18 1819 01/28/18 0059 01/29/18 0346  WBC 18.6* 15.5* 13.2*  CREATININE 9.60* 9.60* 10.26*    Estimated Creatinine Clearance: 8.8 mL/min (A) (by C-G formula based on SCr of 10.26 mg/dL (H)).    Allergies  Allergen Reactions  . Ace Inhibitors Cough   Thank you for allowing pharmacy to be a part of this patient's care.  Mila Merry Gerarda Fraction, PharmD PGY2 Infectious Diseases Pharmacy Resident Phone: [phone] 01/29/2018 10:07 AM

## 2018-01-29 NOTE — Progress Notes (Signed)
Pt noted to breathing labored; using accessory muscles, 02 sats 89% on RA, placed on NRB, 02 sats went to 100%. Pt still c/o not being able to get enough air; Rapid RN called; Dr. Tana Coast and Dr. Justin Mend notified.

## 2018-01-29 NOTE — Progress Notes (Addendum)
Patient ID: [name], male   DOB: 1955/12/11, 62 y.o.   MRN: [id number] Patient is approximately 2 weeks status post right transtibial amputation.  Examination patient has excellent resolution of the residual limb no cellulitis no drainage no wound dehiscence.  Patient is to wear his stump shrinker.  We will plan for removal of the staples in about 1 to 2 weeks.  I will follow-up in the office.  Patient states he is having difficulty moving in the bed.  Orders written for overhead frame and trapeze.

## 2018-01-29 NOTE — Progress Notes (Addendum)
[facility]                                                                              Patient Demographics  [name], is a 62 y.o. male, DOB - [birth year], ZOX:[id number]  Admit date - 01/27/2018   Admitting Physician Raymond Bulls, MD  Outpatient Primary MD for the patient is Raymond General, MD  Outpatient specialists:   LOS - 2  days   Medical records reviewed and are as summarized below:    Chief Complaint  Patient presents with  . 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  He reports an uneventful course at the nursing home until his urine output dropped off over the past couple days, noting that he has urinated very little in the past 2 to 3 days.  He has developed some mild suprapubic discomfort, but denies dysuria or flank pain and denies any fevers or chills.  He was evaluated with basic blood work at the nursing facility and found to have creatinine markedly elevated.  Creatinine 9.6 at the time of admission, 1.1 on 9/18   Assessment & Plan    Principal Problem: Acute kidney injury: Presenting with oliguria to anuria in the last 2 to 3 days prior to admission -Baseline creatinine 1.1, unclear etiology, renal ultrasound did not reveal any obstruction -Possible acute interstitial nephritis, medications, ?  Ancef -Patient was placed on IV fluid hydration, however somewhat short of breath with bibasilar crackles today, abdominal distention  -I's and O's with 4.6 L  positive -Placed on IV Lasix, monitor creatinine function closely  Active Problems: Previous MSSA bacteremia with splenic infarct, tricuspid valve endocarditis -Appreciate ID following, recommended discontinuing Ancef -Antibiotics changed to daptomycin per ID  Thrush, diffuse erythema in the intertriginous areas -Started on Diflucan per pharmacy     Insulin-requiring or dependent type II diabetes mellitus ([place]), uncontrolled with hypoglycemia -Hemoglobin A1c 8.5 -Continue only sliding scale insulin.  Hold long-acting insulin and oral hypoglycemics     Essential hypertension,  CAD (coronary artery disease) -On aspirin, beta-blocker -Holding statin due to antibiotic daptomycin, will check CK -Hold ACE inhibitor secondary to acute kidney injury     S/P BKA (below knee amputation) unilateral, right (HCC) -Appreciate orthopedics following, Dr. Sharol King, currently no cellulitis or drainage or wound dehiscence -Recommended removal of staples in 1 to 2 weeks, follow-up in office     Hyponatremia -Continue to monitor volume status, I's and O's -Currently appears to have volume overload, will recheck after Lasix  Hypocalcemia -Placed on calcium gluconate 2 g IV x1   Addendum 3:45 PM Acute respiratory failure Called by RN patient having increased work of breathing, temporary dialysis cath  by IR was canceled due to dyspnea -On examination, no wheezing, no significant crackles however patient dyspneic at rest, placed on Ventimask -Rapid response called, will obtain stat chest x-ray -PCCM also consulted for temporary dialysis cath.  Informed nephrology, Dr. Justin King - will transfer to stepdown unit, will likely need BiPAP, await chest x-ray  Code Status: Full CODE STATUS DVT Prophylaxis: Heparin subcu Family Communication: Discussed in detail with the patient, all imaging results, lab results explained to the patient    Disposition Plan: Not medically ready  Time Spent in minutes   40  minutes  Procedures:  None  Consultants:   Infectious disease Nephrology  Antimicrobials:  Daptomycin 9/25>>  Medications  Scheduled Meds: . fluconazole  100 mg Oral Once  . [START ON 01/30/2018] fluconazole  50 mg Oral Daily  . Gerhardt's butt cream   Topical TID  . heparin  5,000 Units Subcutaneous Q8H  . insulin aspart  0-5 Units Subcutaneous QHS  . insulin aspart  0-9 Units Subcutaneous TID WC  . latanoprost  1 drop Both Eyes QHS  . metoprolol tartrate  50 mg Oral BID  . nystatin   Topical BID  . polyethylene glycol  17 g Oral Daily  . senna-docusate  1 tablet Oral BID   Continuous Infusions: . DAPTOmycin (CUBICIN)  IV     PRN Meds:.acetaminophen **OR** acetaminophen, clonazepam, ondansetron **OR** ondansetron (ZOFRAN) IV, sodium chloride flush   Antibiotics   Anti-infectives (From admission, onward)   Start     Dose/Rate Route Frequency Ordered Stop   01/30/18 1000  fluconazole (DIFLUCAN) tablet 50 mg     50 mg Oral Daily 01/29/18 0930 02/05/18 0959   01/29/18 2000  DAPTOmycin (CUBICIN) 700 mg in sodium chloride 0.9 % IVPB     700 mg 228 mL/hr over 30 Minutes Intravenous Every 48 hours 01/29/18 1013     01/29/18 1030  fluconazole (DIFLUCAN) tablet 100 mg     100 mg Oral  Once 01/29/18 0930     01/28/18 2200  ceFAZolin (ANCEF) IVPB 1 g/50 mL premix  Status:  Discontinued     1 g 100 mL/hr over 30 Minutes Intravenous Every 24 hours 01/28/18 1007 01/28/18 1231   01/27/18 2245  ceFAZolin (ANCEF) IVPB 2g/100 mL premix  Status:  Discontinued     2 g 200 mL/hr over 30 Minutes Intravenous Every 12 hours 01/27/18 2243 01/28/18 1007   01/27/18 2230  ceFAZolin (ANCEF) IVPB  Status:  Discontinued    Note to Pharmacy:  Indication:  MSSA bacteremia/endocarditis Last Day of Therapy:  03/08/18 Labs - Once weekly:  CBC/D and BMP, Labs - Every other week:  ESR and CRP     2 g Intravenous Every 8 hours 01/27/18 2220 01/27/18 2227   01/27/18 2230  ceFAZolin (ANCEF) IVPB 2g/100  mL premix  Status:  Discontinued     2 g 200 mL/hr over 30 Minutes Intravenous Every 8 hours 01/27/18 2227 01/27/18 2243        Subjective:   [name] was seen and examined today.  Not feeling too good today, short of breath, feels abdomen is distended, poor appetite.  No fevers or chills. Patient denies dizziness, abdominal pain, N/V/D/C, new weakness, numbess, tingling.   Objective:   Vitals:   01/28/18 1741 01/28/18 2131 01/29/18 0535 01/29/18 0732  BP: (!) 146/72 115/67 (!) 141/75 (!) 150/82  Pulse: 86 87 85 87  Resp: 18 20 (!) 22 18  Temp: 97.9 F (36.6 C) 97.8 F (  36.6 C) 97.8 F (36.6 C) 97.9 F (36.6 C)  TempSrc: Oral Oral Oral Oral  SpO2: 98% 97% 97% 97%  Weight:      Height:        Intake/Output Summary (Last 24 hours) at 01/29/2018 1107 Last data filed at 01/29/2018 0900 Gross per 24 hour  Intake 2657 ml  Output 0 ml  Net 2657 ml     Wt Readings from Last 3 Encounters:  01/27/18 95.2 kg  01/23/18 95.2 kg  01/16/18 95.2 kg     Exam  King: Alert and oriented x 3, NAD, appears somewhat uncomfortable  Eyes:   HEENT:  Atraumatic, normocephalic  Cardiovascular: S1 S2 auscultated, Regular rate and rhythm.  Respiratory: Decreased breath sound at the bases with crackles  Gastrointestinal: Soft, nontender, mildly distended, + bowel sounds  Ext: bilateral BKA  Neuro: no new deficits  Musculoskeletal: No digital cyanosis, clubbing  Skin: Candida bilateral groin area  Psych: Normal affect and demeanor, alert and oriented x3    Data Reviewed:  I have personally reviewed following labs and imaging studies  Micro Results Recent Results (from the past 240 hour(s))  Urine culture     Status: None   Collection Time: 01/28/18  4:29 PM  Result Value Ref Range Status   Specimen Description URINE, CATHETERIZED  Final   Special Requests NONE  Final   Culture   Final    NO GROWTH Performed at [facility], [address],  [place], [place] [address]    Report Status 01/29/2018 FINAL  Final    Radiology Reports Dg Abd 1 View  Result Date: 01/28/2018 CLINICAL DATA:  Abdominal pain EXAM: ABDOMEN - 1 VIEW COMPARISON:  CT abdomen pelvis of 01/14/2018 FINDINGS: A supine film of the abdomen shows the lumbar spine to be somewhat rotated to the right. No bowel obstruction is seen. However there is paucity of bowel gas in the left abdomen of doubtful significance in view of the recent CT scan showing no mass. Calcified vas deferens are noted in the pelvis. IMPRESSION: 1. No bowel obstruction. 2. Paucity of bowel gas in the left abdomen of doubtful significance. 3. Calcified vas deferens. Electronically Signed   By: Ivar Drape M.D.   On: 01/28/2018 11:24   Ct Head Wo Contrast  Result Date: 01/14/2018 CLINICAL DATA:  Unexplained altered level consciousness. Slurred speech. Nausea. Frontal headache. Nausea and vomiting. EXAM: CT HEAD WITHOUT CONTRAST TECHNIQUE: Contiguous axial images were obtained from the base of the skull through the vertex without intravenous contrast. COMPARISON:  07/27/2014 FINDINGS: Brain: No evidence of acute infarction, hemorrhage, hydrocephalus, extra-axial collection, or mass lesion/mass effect. Mild generalized cerebral atrophy, without significant change. Vascular:  No hyperdense vessel or other acute findings. Skull: No evidence of fracture or other significant bone abnormality. Sinuses/Orbits:  No acute findings. Other: None. IMPRESSION: No acute intracranial abnormality.  Mild cerebral atrophy. Electronically Signed   By: Earle Gell M.D.   On: 01/14/2018 20:05   Ct Abdomen Pelvis W Contrast  Result Date: 01/14/2018 CLINICAL DATA:  Acute abdominal pain. EXAM: CT ABDOMEN AND PELVIS WITH CONTRAST TECHNIQUE: Multidetector CT imaging of the abdomen and pelvis was performed using the standard protocol following bolus administration of intravenous contrast. CONTRAST:  174m ISOVUE-300 IOPAMIDOL (ISOVUE-300)  INJECTION 61% COMPARISON:  None. FINDINGS: Lower chest: Motion artifact through the lung bases. Mild dependent atelectasis on the left. Heart appears prominent size. There are coronary artery calcifications. Hepatobiliary: Decreased hepatic density consistent with steatosis. No  discrete focal lesion. Hyperdensity at the gallbladder neck may be stones or sludge. No pericholecystic inflammation. No biliary dilatation. Pancreas: Fatty atrophy.  No ductal dilatation or inflammation. Spleen: Normal in size. Peripheral slightly wedge-shaped low-density in the posterior spleen measures approximately 4 cm in depth. No perisplenic fluid. Adrenals/Urinary Tract: Mild thickening left adrenal gland without dominant nodule. Normal right adrenal gland. No hydronephrosis. Symmetric bilateral perinephric edema. Homogeneous renal enhancement. Possible small exophytic cyst from the mid left kidney versus perinephric edema. Urinary bladder is physiologically distended. No bladder wall thickening. Stomach/Bowel: Small hiatal hernia. Motion artifact and lack of enteric contrast limits detailed bowel assessment. Allowing for this, no evidence of obstruction, inflammation or bowel wall thickening. Normal appendix. Vascular/Lymphatic: Moderate aorto bi-iliac atherosclerosis without aneurysm. Portal vein and mesenteric vessels are patent. No enlarged abdominal or pelvic lymph nodes. Reproductive: Prostate is unremarkable. Other: No free air or free fluid. Musculoskeletal: Scoliosis and degenerative change throughout the lumbar spine. Slight flattening of the left femoral head may be chronic. No acute osseous abnormality, particularly, no left rib fractures. IMPRESSION: 1. 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 No perisplenic fluid. 2. Symmetric bilateral perinephric edema, likely chronic but can be seen in the setting of urinary tract infection. 3. Gallstones or sludge without gallbladder inflammation  on CT. 4. Incidental hepatic steatosis and Aortic Atherosclerosis (ICD10-I70.0). Electronically Signed   By: Keith Rake M.D.   On: 01/14/2018 22:19   US Renal  Result Date: 01/27/2018 CLINICAL DATA:  62 year old male with acute renal failure. EXAM: RENAL / URINARY TRACT ULTRASOUND COMPLETE COMPARISON:  CT of abdomen pelvis dated 01/14/2018 FINDINGS: Right Kidney: Length: 12.2 cm. Normal echogenicity. No hydronephrosis or shadowing stone. Left Kidney: Length: 11.7 cm. Normal echogenicity. No hydronephrosis or shadowing stone. Bladder: There is layering debris in the urinary bladder. Correlation with urinalysis recommended. The liver is slightly echogenic likely fatty infiltration. There is probable Riedel's lobe anatomy. IMPRESSION: 1. No hydronephrosis or shadowing stone. 2. Layering debris within the urinary bladder. Correlation with urinalysis recommended. Electronically Signed   By: Anner Crete M.D.   On: 01/27/2018 22:38   Mr Foot Right W Wo Contrast  Result Date: 01/16/2018 CLINICAL DATA:  Necrotic gangrenous ulcer on the lateral aspect of the right foot with ascending cellulitis. EXAM: MRI OF THE RIGHT FOREFOOT WITHOUT AND WITH CONTRAST TECHNIQUE: Multiplanar, multisequence MR imaging of the right foot was performed before and after the administration of intravenous contrast. CONTRAST:  10 cc Gadavist COMPARISON:  Radiographs 01/15/2018 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 metatarsal level. I do not see any marrow signal abnormality or enhancement to suggest osteomyelitis. No findings for septic arthritis. No subcutaneous soft tissue swelling/edema/fluid surrounding the foot suggesting cellulitis but no discrete drainable soft tissue abscess or findings for pyomyositis. IMPRESSION: 1. Changes suggesting cellulitis but no discrete drainable soft tissue abscess. 2. No MR findings for septic arthritis, osteomyelitis or pyomyositis. Electronically Signed   By:  Marijo Sanes M.D.   On: 01/16/2018 10:53   Dg Chest Portable 1 View  Result Date: 01/27/2018 CLINICAL DATA:  Intermittent LEFT chest pain.  Oliguric. EXAM: PORTABLE CHEST 1 VIEW COMPARISON:  Chest radiograph January 15, 2018 FINDINGS: RIGHT PICC distal tip projects in distal superior vena cava. No pneumothorax. Stable mild cardiomegaly. Status post aortic valve replacement. LEFT mid lung zone bandlike density with mildly elevated LEFT hemidiaphragm. No focal consolidation. Blunting of the LEFT costophrenic angle. No pneumothorax. Soft tissue planes and included osseous  structures are non suspicious. Surgical clips bilateral axilla. IMPRESSION: 1. RIGHT PICC distal tip projecting in distal superior vena cava. No pneumothorax. 2. LEFT lung base atelectasis with small pleural effusion versus pleural thickening. 3. Stable cardiomegaly. Electronically Signed   By: Elon Alas M.D.   On: 01/27/2018 18:43   Dg Chest Port 1 View  Result Date: 01/15/2018 CLINICAL DATA:  Severe sepsis. EXAM: PORTABLE CHEST 1 VIEW COMPARISON:  Radiograph 12/08/2014 FINDINGS: Stable cardiomegaly. Post CABG with prosthetic valve. No sternal wires visualized. No pulmonary edema. Scattered atelectasis. No confluent airspace disease, pleural effusion or pneumothorax. No acute osseous abnormalities. IMPRESSION: Chronic cardiomegaly with scattered atelectasis. Electronically Signed   By: Keith Rake M.D.   On: 01/15/2018 00:20   Dg Foot Complete Right  Result Date: 01/15/2018 CLINICAL DATA:  RIGHT foot infection, sepsis, prior amputation EXAM: RIGHT FOOT COMPLETE - 3+ VIEW COMPARISON:  04/06/2009 FINDINGS: Osseous demineralization. Prior transmetatarsal amputation. Joint spaces preserved. Mild dorsal spur formation at the talonavicular joint. No acute fracture, dislocation or bone destruction. Motion artifacts degrade lateral view, no gross osseous abnormalities identified. Scattered soft tissue swelling without soft tissue  gas. IMPRESSION: Transmetatarsal amputation RIGHT foot. No acute osseous findings. Electronically Signed   By: Lavonia Dana M.D.   On: 01/15/2018 00:57   Ct Renal Stone Study  Result Date: 01/29/2018 CLINICAL DATA:  anuria EXAM: CT ABDOMEN AND PELVIS WITHOUT CONTRAST TECHNIQUE: Multidetector CT imaging of the abdomen and pelvis was performed following the standard protocol without oral or IV contrast. COMPARISON:  January 14, 2018 FINDINGS: Lower chest: There are pleural effusions bilaterally with consolidation in the posterior lung bases. There are foci of coronary artery calcification. There is a small hiatal hernia. Hepatobiliary: There is hepatic steatosis. No focal liver lesions are appreciable. Gallbladder is mildly distended. There are gallstones present. There may also be sludge in the gallbladder. There is no appreciable gallbladder wall thickening. There is no pericholecystic fluid. Pancreas: There is fatty infiltration in the pancreas. No focal pancreatic lesions are evident. Spleen: There is decreased attenuation in the posterior spleen in a wedge-shaped configuration consistent with splenic infarct, felt to be stable from most recent study. No new splenic lesions are evident on this noncontrast enhanced study. Adrenals/Urinary Tract: Adrenals bilaterally appear normal. There is no evident renal mass or hydronephrosis on either side. There is no appreciable renal or ureteral calculus on either side. There is a Foley catheter within the urinary bladder. There is wall thickening in portions of the urinary bladder anteriorly. Air within the urinary bladder may well be iatrogenic King recent catheterization. Stomach/Bowel: There is no appreciable bowel wall or mesenteric thickening. No evident bowel obstruction. There is no appreciable free air or portal venous air. Vascular/Lymphatic: There is aortic atherosclerosis. There is also atherosclerotic calcification in the common iliac arteries. No aneurysm  evident. Major mesenteric arterial vessels are felt to be patent, although there are foci of atherosclerotic calcification in mesenteric vessels. There is no appreciable adenopathy in the abdomen or pelvis. Reproductive: Prostate and seminal vesicles are normal in size and contour. There is calcification in the seminal vesicles. No pelvic masses evident. Other: There is ascites in the abdomen and pelvis with areas of apparent loculation of ascites in the pelvis. No abscess is evident in the abdomen or pelvis. Appendix appears unremarkable. There is mesenteric thickening in the left lower abdomen, near in area of loculated ascites. There is generalized anasarca. Musculoskeletal: Degenerative change throughout the lower thoracic and lumbar regions remain stable. There  is loss of cortex with soft tissue thickening along the anterior, inferior aspect of T12 with loss of disc space in this area. There is also lucency along the posterior aspect of the L1 vertebral body, stable. A degree of discitis/osteomyelitis in this area must be of concern. No intramuscular lesions are evident. IMPRESSION: 1. Loss of cortex along the anterior inferior aspect of the T12 vertebral body as well as lucency in the posterior aspect of the L1 vertebral body. Question a degree of discitis/osteomyelitis in this region. This finding may warrant MR pre and post-contrast to further evaluate with respect to discitis/bony destruction. 2. Generalized anasarca. Areas of partially loculated ascites as well as mild mesenteric thickening in the left lower quadrant. No evident abscess in the abdomen or pelvis. 3. Cholelithiasis with probable sludge in the gallbladder as well. Gallbladder mildly distended. Gallbladder wall not thickened. 4. No evident hydronephrosis on either side. No renal or ureteral calculus on either side. 5. Urinary bladder decompressed. Air within the urinary bladder likely is due to recent catheterization. Correlation with  urinalysis to assess for possible gas-forming organism is a cause for air within the bladder is advised. 6.  No bowel obstruction.  Appendix region appears normal. 7.  Small hiatal hernia. 8.  Aortoiliac atherosclerosis.  Coronary artery calcification. 9.  Bilateral pleural effusions with bibasilar consolidation. 10. 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 Aortic Atherosclerosis (ICD10-I70.0). Electronically Signed   By: Lowella Grip III M.D.   On: 01/29/2018 07:08   [place] Ekg Site Rite  Result Date: 01/21/2018 If Site Rite image not attached, placement could not be confirmed due to current cardiac rhythm.   Lab Data:  CBC: Recent Labs  Lab 01/27/18 1819 01/28/18 0059 01/29/18 0346  WBC 18.6* 15.5* 13.2*  NEUTROABS  --   --  11.0*  HGB 13.2 12.6* 10.8*  HCT 39.7 36.8* 32.8*  MCV 86.3 84.2 86.1  PLT 165 146* 341*   Basic Metabolic Panel: Recent Labs  Lab 01/27/18 1819 01/28/18 0059 01/29/18 0346  NA 128* 129* 127*  K 4.6 4.9 4.9  CL 94* 96* 97*  CO2 13* 12* 12*  GLUCOSE 53* 123* 67*  BUN 84* 88* 94*  CREATININE 9.60* 9.60* 10.26*  CALCIUM 6.6* 6.5* 5.8*   GFR: Estimated Creatinine Clearance: 8.8 mL/min (A) (by C-G formula based on SCr of 10.26 mg/dL (H)). Liver Function Tests: Recent Labs  Lab 01/27/18 1819  AST 19  ALT <5  ALKPHOS 66  BILITOT 0.5  PROT 5.8*  ALBUMIN 2.2*   No results for input(s): LIPASE, AMYLASE in the last 168 hours. No results for input(s): AMMONIA in the last 168 hours. Coagulation Profile: No results for input(s): INR, PROTIME in the last 168 hours. Cardiac Enzymes: No results for input(s): CKTOTAL, CKMB, CKMBINDEX, TROPONINI in the last 168 hours. BNP (last 3 results) No results for input(s): PROBNP in the last 8760 hours. HbA1C: No results for input(s): HGBA1C in the last 72 hours. CBG: Recent Labs  Lab 01/28/18 0738 01/28/18 1151 01/28/18 1642 01/28/18 2131 01/29/18 0734  GLUCAP 61* 77 87 76 63*     Lipid Profile: No results for input(s): CHOL, HDL, LDLCALC, TRIG, CHOLHDL, LDLDIRECT in the last 72 hours. Thyroid Function Tests: No results for input(s): TSH, T4TOTAL, FREET4, T3FREE, THYROIDAB in the last 72 hours. Anemia Panel: No results for input(s): VITAMINB12, FOLATE, FERRITIN, TIBC, IRON, RETICCTPCT in the last 72 hours. Urine analysis:    Component Value Date/Time   COLORURINE AMBER (  A) 01/27/2018 1034   APPEARANCEUR TURBID (A) 01/27/2018 1034   LABSPEC 1.017 01/27/2018 1034   PHURINE 6.0 01/27/2018 1034   GLUCOSEU NEGATIVE 01/27/2018 1034   HGBUR MODERATE (A) 01/27/2018 1034   BILIRUBINUR NEGATIVE 01/27/2018 1034   KETONESUR NEGATIVE 01/27/2018 1034   PROTEINUR >=300 (A) 01/27/2018 1034   UROBILINOGEN 0.2 07/28/2014 0433   NITRITE NEGATIVE 01/27/2018 1034   LEUKOCYTESUR MODERATE (A) 01/27/2018 1034     Zyah Gomm M.D. [facility] 01/29/2018, 11:07 AM  Pager: [phone] Between 7am to 7pm - call Pager - [phone]  After 7pm go to [email or web address] - password [email or web address]  Call night coverage person covering after 7pm

## 2018-01-29 NOTE — Procedures (Addendum)
Central Venous Dialysis Catheter Insertion Procedure Note [name] [id number] [birth year]  Procedure: Insertion of Central Venous Catheter Indications: Assessment of intravascular volume, Drug and/or fluid administration and Frequent blood sampling  Procedure Details Consent: Risks of procedure as well as the alternatives and risks of each were explained to the (patient/caregiver).  Consent for procedure obtained. Time Out: Verified patient identification, verified procedure, site/side was marked, verified correct patient position, special equipment/implants available, medications/allergies/relevent history reviewed, required imaging and test results available.  Performed Real time [place] used to ID and cannulate vessel  Maximum sterile technique was used including antiseptics, cap, gloves, gown, hand hygiene, mask and sheet. Skin prep: Chlorhexidine; local anesthetic administered A antimicrobial bonded/coated triple lumen catheter was placed in the right internal jugular vein using the Seldinger technique.  Evaluation Blood flow good Complications: No apparent complications Patient did tolerate procedure well. Chest X-ray ordered to verify placement.  CXR: satisfactory position.   [name] 01/29/2018, 4:12 PM  [name] ACNP-BC [place] Pager # [phone] OR # [phone] if no answer

## 2018-01-29 NOTE — Consult Note (Signed)
[facility] Nurse wound consult note Reason for Consult: IAD to buttocks and posterior thighs and intertriginous dermatitis in the bilateral inguinal and scrotal areas. Patient with recent history of antibiotic use second ary to amputation Wound type: Moisture with fungal overgrowth Pressure Injury POA: NA Measurement:diffuse erythema with satellite lesions in the above locations Wound bed:N/A Drainage (amount, consistency, odor) scant serous Periwound:intact, dry and with poor hygiene.  Patient states he had been refusing personal care at the SNF  Dressing procedure/placement/frequency: I will today provide a mattress replacement with low air loss feature. Additionally, I have provided Nursing with Guidance for a topical antifungal/zinc oxide/hydrocortisone preparation for the buttock and posterior thigh areas.  Our house antimicrobial textile ([employer]+) will be used in the groin and scrotal areas.  Note:  I recommend adding a systemic antifungal (eg. Diflucan).  If you agree, please order.   [facility] nursing team will not follow, but will remain available to this patient, the nursing and medical teams.  Please re-consult if needed. Thanks, Maudie Flakes, MSN, RN, [place], Arther Abbott  Pager# [phone]

## 2018-01-29 NOTE — Progress Notes (Signed)
INFECTIOUS DISEASE PROGRESS NOTE  ID: [name] is a 62 y.o. male with  Principal Problem:   Acute renal failure (ARF) ([place]) Active Problems:   Insulin-requiring or dependent type II diabetes mellitus ([place])   Essential hypertension   CAD (coronary artery disease)   Leukocytosis   S/P BKA (below knee amputation) unilateral, right ([place])   Endocarditis of tricuspid valve   Hypoglycemia due to insulin   Hyponatremia  Subjective: C/o abd distension, sob  Abtx:  Anti-infectives (From admission, onward)   Start     Dose/Rate Route Frequency Ordered Stop   01/30/18 1000  fluconazole (DIFLUCAN) tablet 50 mg     50 mg Oral Daily 01/29/18 0930 02/05/18 0959   01/29/18 1030  fluconazole (DIFLUCAN) tablet 100 mg     100 mg Oral  Once 01/29/18 0930     01/28/18 2200  ceFAZolin (ANCEF) IVPB 1 g/50 mL premix  Status:  Discontinued     1 g 100 mL/hr over 30 Minutes Intravenous Every 24 hours 01/28/18 1007 01/28/18 1231   01/27/18 2245  ceFAZolin (ANCEF) IVPB 2g/100 mL premix  Status:  Discontinued     2 g 200 mL/hr over 30 Minutes Intravenous Every 12 hours 01/27/18 2243 01/28/18 1007   01/27/18 2230  ceFAZolin (ANCEF) IVPB  Status:  Discontinued    Note to Pharmacy:  Indication:  MSSA bacteremia/endocarditis Last Day of Therapy:  03/08/18 Labs - Once weekly:  CBC/D and BMP, Labs - Every other week:  ESR and CRP     2 g Intravenous Every 8 hours 01/27/18 2220 01/27/18 2227   01/27/18 2230  ceFAZolin (ANCEF) IVPB 2g/100 mL premix  Status:  Discontinued     2 g 200 mL/hr over 30 Minutes Intravenous Every 8 hours 01/27/18 2227 01/27/18 2243      Medications:  Scheduled: . fluconazole  100 mg Oral Once  . [START ON 01/30/2018] fluconazole  50 mg Oral Daily  . Gerhardt's butt cream   Topical TID  . heparin  5,000 Units Subcutaneous Q8H  . insulin aspart  0-5 Units Subcutaneous QHS  . insulin aspart  0-9 Units Subcutaneous TID WC  . latanoprost  1 drop Both Eyes QHS  .  metoprolol tartrate  50 mg Oral BID  . nystatin   Topical BID  . polyethylene glycol  17 g Oral Daily  . senna-docusate  1 tablet Oral BID  . simvastatin  5 mg Oral q1800    Objective: Vital signs in last 24 hours: Temp:  [97.8 F (36.6 C)-97.9 F (36.6 C)] 97.9 F (36.6 C) (09/25 0732) Pulse Rate:  [85-87] 87 (09/25 0732) Resp:  [18-22] 18 (09/25 0732) BP: (115-150)/(67-82) 150/82 (09/25 0732) SpO2:  [97 %-98 %] 97 % (09/25 0732)   General appearance: alert, cooperative and mild distress Resp: clear to auscultation bilaterally Cardio: regular rate and rhythm GI: normal findings: bowel sounds normal and abnormal findings:  distended and tenderness but no r/g Incision/Wound: RLE wound dressed.   Lab Results Recent Labs    01/28/18 0059 01/29/18 0346  WBC 15.5* 13.2*  HGB 12.6* 10.8*  HCT 36.8* 32.8*  NA 129* 127*  K 4.9 4.9  CL 96* 97*  CO2 12* 12*  BUN 88* 94*  CREATININE 9.60* 10.26*   Liver Panel Recent Labs    01/27/18 1819  PROT 5.8*  ALBUMIN 2.2*  AST 19  ALT <5  ALKPHOS 66  BILITOT 0.5   Sedimentation Rate No results for input(s): ESRSEDRATE  in the last 72 hours. C-Reactive Protein No results for input(s): CRP in the last 72 hours.  Microbiology: No results found for this or any previous visit (from the past 240 hour(s)).  Studies/Results: Dg Abd 1 View  Result Date: 01/28/2018 CLINICAL DATA:  Abdominal pain EXAM: ABDOMEN - 1 VIEW COMPARISON:  CT abdomen pelvis of 01/14/2018 FINDINGS: A supine film of the abdomen shows the lumbar spine to be somewhat rotated to the right. No bowel obstruction is seen. However there is paucity of bowel gas in the left abdomen of doubtful significance in view of the recent CT scan showing no mass. Calcified vas deferens are noted in the pelvis. IMPRESSION: 1. No bowel obstruction. 2. Paucity of bowel gas in the left abdomen of doubtful significance. 3. Calcified vas deferens. Electronically Signed   By: Ivar Drape  M.D.   On: 01/28/2018 11:24   US Renal  Result Date: 01/27/2018 CLINICAL DATA:  62 year old male with acute renal failure. EXAM: RENAL / URINARY TRACT ULTRASOUND COMPLETE COMPARISON:  CT of abdomen pelvis dated 01/14/2018 FINDINGS: Right Kidney: Length: 12.2 cm. Normal echogenicity. No hydronephrosis or shadowing stone. Left Kidney: Length: 11.7 cm. Normal echogenicity. No hydronephrosis or shadowing stone. Bladder: There is layering debris in the urinary bladder. Correlation with urinalysis recommended. The liver is slightly echogenic likely fatty infiltration. There is probable Riedel's lobe anatomy. IMPRESSION: 1. No hydronephrosis or shadowing stone. 2. Layering debris within the urinary bladder. Correlation with urinalysis recommended. Electronically Signed   By: Anner Crete M.D.   On: 01/27/2018 22:38   Dg Chest Portable 1 View  Result Date: 01/27/2018 CLINICAL DATA:  Intermittent LEFT chest pain.  Oliguric. EXAM: PORTABLE CHEST 1 VIEW COMPARISON:  Chest radiograph January 15, 2018 FINDINGS: RIGHT PICC distal tip projects in distal superior vena cava. No pneumothorax. Stable mild cardiomegaly. Status post aortic valve replacement. LEFT mid lung zone bandlike density with mildly elevated LEFT hemidiaphragm. No focal consolidation. Blunting of the LEFT costophrenic angle. No pneumothorax. Soft tissue planes and included osseous structures are non suspicious. Surgical clips bilateral axilla. IMPRESSION: 1. RIGHT PICC distal tip projecting in distal superior vena cava. No pneumothorax. 2. LEFT lung base atelectasis with small pleural effusion versus pleural thickening. 3. Stable cardiomegaly. Electronically Signed   By: Elon Alas M.D.   On: 01/27/2018 18:43   Ct Renal Stone Study  Result Date: 01/29/2018 CLINICAL DATA:  anuria EXAM: CT ABDOMEN AND PELVIS WITHOUT CONTRAST TECHNIQUE: Multidetector CT imaging of the abdomen and pelvis was performed following the standard protocol without  oral or IV contrast. COMPARISON:  January 14, 2018 FINDINGS: Lower chest: There are pleural effusions bilaterally with consolidation in the posterior lung bases. There are foci of coronary artery calcification. There is a small hiatal hernia. Hepatobiliary: There is hepatic steatosis. No focal liver lesions are appreciable. Gallbladder is mildly distended. There are gallstones present. There may also be sludge in the gallbladder. There is no appreciable gallbladder wall thickening. There is no pericholecystic fluid. Pancreas: There is fatty infiltration in the pancreas. No focal pancreatic lesions are evident. Spleen: There is decreased attenuation in the posterior spleen in a wedge-shaped configuration consistent with splenic infarct, felt to be stable from most recent study. No new splenic lesions are evident on this noncontrast enhanced study. Adrenals/Urinary Tract: Adrenals bilaterally appear normal. There is no evident renal mass or hydronephrosis on either side. There is no appreciable renal or ureteral calculus on either side. There is a Foley catheter within the urinary bladder.  There is wall thickening in portions of the urinary bladder anteriorly. Air within the urinary bladder may well be iatrogenic given recent catheterization. Stomach/Bowel: There is no appreciable bowel wall or mesenteric thickening. No evident bowel obstruction. There is no appreciable free air or portal venous air. Vascular/Lymphatic: There is aortic atherosclerosis. There is also atherosclerotic calcification in the common iliac arteries. No aneurysm evident. Major mesenteric arterial vessels are felt to be patent, although there are foci of atherosclerotic calcification in mesenteric vessels. There is no appreciable adenopathy in the abdomen or pelvis. Reproductive: Prostate and seminal vesicles are normal in size and contour. There is calcification in the seminal vesicles. No pelvic masses evident. Other: There is ascites in  the abdomen and pelvis with areas of apparent loculation of ascites in the pelvis. No abscess is evident in the abdomen or pelvis. Appendix appears unremarkable. There is mesenteric thickening in the left lower abdomen, near in area of loculated ascites. There is generalized anasarca. Musculoskeletal: Degenerative change throughout the lower thoracic and lumbar regions remain stable. There is loss of cortex with soft tissue thickening along the anterior, inferior aspect of T12 with loss of disc space in this area. There is also lucency along the posterior aspect of the L1 vertebral body, stable. A degree of discitis/osteomyelitis in this area must be of concern. No intramuscular lesions are evident. IMPRESSION: 1. Loss of cortex along the anterior inferior aspect of the T12 vertebral body as well as lucency in the posterior aspect of the L1 vertebral body. Question a degree of discitis/osteomyelitis in this region. This finding may warrant MR pre and post-contrast to further evaluate with respect to discitis/bony destruction. 2. Generalized anasarca. Areas of partially loculated ascites as well as mild mesenteric thickening in the left lower quadrant. No evident abscess in the abdomen or pelvis. 3. Cholelithiasis with probable sludge in the gallbladder as well. Gallbladder mildly distended. Gallbladder wall not thickened. 4. No evident hydronephrosis on either side. No renal or ureteral calculus on either side. 5. Urinary bladder decompressed. Air within the urinary bladder likely is due to recent catheterization. Correlation with urinalysis to assess for possible gas-forming organism is a cause for air within the bladder is advised. 6.  No bowel obstruction.  Appendix region appears normal. 7.  Small hiatal hernia. 8.  Aortoiliac atherosclerosis.  Coronary artery calcification. 9.  Bilateral pleural effusions with bibasilar consolidation. 10. 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 Aortic  Atherosclerosis (ICD10-I70.0). Electronically Signed   By: Lowella Grip III M.D.   On: 01/29/2018 07:08     Assessment/Plan: ARF ? Thrush on tongue TV IE Prev MSSA bacteremia Splenic infarct  Total days of anbx: 16 Will change [name] to daptomycin Cr slightly worse today Will watch his Cr Appreciate renal eval.          Bobby Rumpf MD, FACP Infectious Diseases (pager) [phone] [email or web address] 01/29/2018, 10:02 AM  LOS: 2 days

## 2018-01-29 NOTE — Progress Notes (Signed)
[facility] ROUNDING NOTE   Subjective:   Continues to be anuric.  Blood pressure 150/80.  Afebrile O2 sats 100% room air.  Ancef discontinued 01/27/2018.  IV Lasix given this morning with no diuresis no urine output Foley catheter in place.  Lower extremity edema noted.  sodium 127 potassium 4.9 CO2 12 creatinine increased to 10.26 calcium 5.8 hemoglobin 10.8 white blood count 13.2 CT scan abdomen and pelvis revealed generalized anasarca.  He had cholelithiasis with no evidence of cholecystitis.  Objective:  Vital signs in last 24 hours:  Temp:  [97.8 F (36.6 C)-97.9 F (36.6 C)] 97.9 F (36.6 C) (09/25 0732) Pulse Rate:  [85-87] 87 (09/25 0732) Resp:  [18-22] 18 (09/25 0732) BP: (115-150)/(67-82) 150/82 (09/25 0732) SpO2:  [97 %-98 %] 97 % (09/25 0732)  Weight change:  Filed Weights   01/27/18 1803  Weight: 95.2 kg    Intake/Output: I/O last 3 completed shifts: In: 4382.6 [P.O.:940; I.V.:2342.6; IV Piggyback:1100] Out: 55 [Urine:50]   Intake/Output this shift:  Total I/O In: 300 [P.O.:300] Out: -   CVS- RRR systolic murmur RS-  decreased breath sounds at base ABD-distended abdomen EXT-generalized edema   Basic Metabolic Panel: Recent Labs  Lab 01/27/18 1819 01/28/18 0059 01/29/18 0346  NA 128* 129* 127*  K 4.6 4.9 4.9  CL 94* 96* 97*  CO2 13* 12* 12*  GLUCOSE 53* 123* 67*  BUN 84* 88* 94*  CREATININE 9.60* 9.60* 10.26*  CALCIUM 6.6* 6.5* 5.8*    Liver Function Tests: Recent Labs  Lab 01/27/18 1819  AST 19  ALT <5  ALKPHOS 66  BILITOT 0.5  PROT 5.8*  ALBUMIN 2.2*   No results for input(s): LIPASE, AMYLASE in the last 168 hours. No results for input(s): AMMONIA in the last 168 hours.  CBC: Recent Labs  Lab 01/27/18 1819 01/28/18 0059 01/29/18 0346  WBC 18.6* 15.5* 13.2*  NEUTROABS  --   --  11.0*  HGB 13.2 12.6* 10.8*  HCT 39.7 36.8* 32.8*  MCV 86.3 84.2 86.1  PLT 165 146* 132*    Cardiac Enzymes: No results for  input(s): CKTOTAL, CKMB, CKMBINDEX, TROPONINI in the last 168 hours.  BNP: Invalid input(s): POCBNP  CBG: Recent Labs  Lab 01/28/18 0738 01/28/18 1151 01/28/18 1642 01/28/18 2131 01/29/18 0734  GLUCAP 61* 77 87 76 101*    Microbiology: Results for orders placed or performed during the hospital encounter of 01/27/18  Urine culture     Status: None   Collection Time: 01/28/18  4:29 PM  Result Value Ref Range Status   Specimen Description URINE, CATHETERIZED  Final   Special Requests NONE  Final   Culture   Final    NO GROWTH Performed at [facility], [place] [address] [place], [place] [address]    Report Status 01/29/2018 FINAL  Final    Coagulation Studies: No results for input(s): LABPROT, INR in the last 72 hours.  Urinalysis: Recent Labs    01/27/18 1034  COLORURINE AMBER*  LABSPEC 1.017  PHURINE 6.0  GLUCOSEU NEGATIVE  HGBUR MODERATE*  BILIRUBINUR NEGATIVE  KETONESUR NEGATIVE  PROTEINUR >=300*  NITRITE NEGATIVE  LEUKOCYTESUR MODERATE*      Imaging: Dg Abd 1 View  Result Date: 01/28/2018 CLINICAL DATA:  Abdominal pain EXAM: ABDOMEN - 1 VIEW COMPARISON:  CT abdomen pelvis of 01/14/2018 FINDINGS: A supine film of the abdomen shows the lumbar spine to be somewhat rotated to the right. No bowel obstruction is seen. However there is  paucity of bowel gas in the left abdomen of doubtful significance in view of the recent CT scan showing no mass. Calcified vas deferens are noted in the pelvis. IMPRESSION: 1. No bowel obstruction. 2. Paucity of bowel gas in the left abdomen of doubtful significance. 3. Calcified vas deferens. Electronically Signed   By: Ivar Drape M.D.   On: 01/28/2018 11:24   US Renal  Result Date: 01/27/2018 CLINICAL DATA:  62 year old male with acute renal failure. EXAM: RENAL / URINARY TRACT ULTRASOUND COMPLETE COMPARISON:  CT of abdomen pelvis dated 01/14/2018 FINDINGS: Right Kidney: Length: 12.2 cm. Normal echogenicity. No hydronephrosis  or shadowing stone. Left Kidney: Length: 11.7 cm. Normal echogenicity. No hydronephrosis or shadowing stone. Bladder: There is layering debris in the urinary bladder. Correlation with urinalysis recommended. The liver is slightly echogenic likely fatty infiltration. There is probable Riedel's lobe anatomy. IMPRESSION: 1. No hydronephrosis or shadowing stone. 2. Layering debris within the urinary bladder. Correlation with urinalysis recommended. Electronically Signed   By: Anner Crete M.D.   On: 01/27/2018 22:38   Dg Chest Portable 1 View  Result Date: 01/27/2018 CLINICAL DATA:  Intermittent LEFT chest pain.  Oliguric. EXAM: PORTABLE CHEST 1 VIEW COMPARISON:  Chest radiograph January 15, 2018 FINDINGS: RIGHT PICC distal tip projects in distal superior vena cava. No pneumothorax. Stable mild cardiomegaly. Status post aortic valve replacement. LEFT mid lung zone bandlike density with mildly elevated LEFT hemidiaphragm. No focal consolidation. Blunting of the LEFT costophrenic angle. No pneumothorax. Soft tissue planes and included osseous structures are non suspicious. Surgical clips bilateral axilla. IMPRESSION: 1. RIGHT PICC distal tip projecting in distal superior vena cava. No pneumothorax. 2. LEFT lung base atelectasis with small pleural effusion versus pleural thickening. 3. Stable cardiomegaly. Electronically Signed   By: Elon Alas M.D.   On: 01/27/2018 18:43   Ct Renal Stone Study  Result Date: 01/29/2018 CLINICAL DATA:  anuria EXAM: CT ABDOMEN AND PELVIS WITHOUT CONTRAST TECHNIQUE: Multidetector CT imaging of the abdomen and pelvis was performed following the standard protocol without oral or IV contrast. COMPARISON:  January 14, 2018 FINDINGS: Lower chest: There are pleural effusions bilaterally with consolidation in the posterior lung bases. There are foci of coronary artery calcification. There is a small hiatal hernia. Hepatobiliary: There is hepatic steatosis. No focal liver  lesions are appreciable. Gallbladder is mildly distended. There are gallstones present. There may also be sludge in the gallbladder. There is no appreciable gallbladder wall thickening. There is no pericholecystic fluid. Pancreas: There is fatty infiltration in the pancreas. No focal pancreatic lesions are evident. Spleen: There is decreased attenuation in the posterior spleen in a wedge-shaped configuration consistent with splenic infarct, felt to be stable from most recent study. No new splenic lesions are evident on this noncontrast enhanced study. Adrenals/Urinary Tract: Adrenals bilaterally appear normal. There is no evident renal mass or hydronephrosis on either side. There is no appreciable renal or ureteral calculus on either side. There is a Foley catheter within the urinary bladder. There is wall thickening in portions of the urinary bladder anteriorly. Air within the urinary bladder may well be iatrogenic given recent catheterization. Stomach/Bowel: There is no appreciable bowel wall or mesenteric thickening. No evident bowel obstruction. There is no appreciable free air or portal venous air. Vascular/Lymphatic: There is aortic atherosclerosis. There is also atherosclerotic calcification in the common iliac arteries. No aneurysm evident. Major mesenteric arterial vessels are felt to be patent, although there are foci of atherosclerotic calcification in mesenteric vessels. There is  no appreciable adenopathy in the abdomen or pelvis. Reproductive: Prostate and seminal vesicles are normal in size and contour. There is calcification in the seminal vesicles. No pelvic masses evident. Other: There is ascites in the abdomen and pelvis with areas of apparent loculation of ascites in the pelvis. No abscess is evident in the abdomen or pelvis. Appendix appears unremarkable. There is mesenteric thickening in the left lower abdomen, near in area of loculated ascites. There is generalized anasarca. Musculoskeletal:  Degenerative change throughout the lower thoracic and lumbar regions remain stable. There is loss of cortex with soft tissue thickening along the anterior, inferior aspect of T12 with loss of disc space in this area. There is also lucency along the posterior aspect of the L1 vertebral body, stable. A degree of discitis/osteomyelitis in this area must be of concern. No intramuscular lesions are evident. IMPRESSION: 1. Loss of cortex along the anterior inferior aspect of the T12 vertebral body as well as lucency in the posterior aspect of the L1 vertebral body. Question a degree of discitis/osteomyelitis in this region. This finding may warrant MR pre and post-contrast to further evaluate with respect to discitis/bony destruction. 2. Generalized anasarca. Areas of partially loculated ascites as well as mild mesenteric thickening in the left lower quadrant. No evident abscess in the abdomen or pelvis. 3. Cholelithiasis with probable sludge in the gallbladder as well. Gallbladder mildly distended. Gallbladder wall not thickened. 4. No evident hydronephrosis on either side. No renal or ureteral calculus on either side. 5. Urinary bladder decompressed. Air within the urinary bladder likely is due to recent catheterization. Correlation with urinalysis to assess for possible gas-forming organism is a cause for air within the bladder is advised. 6.  No bowel obstruction.  Appendix region appears normal. 7.  Small hiatal hernia. 8.  Aortoiliac atherosclerosis.  Coronary artery calcification. 9.  Bilateral pleural effusions with bibasilar consolidation. 10. 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 Aortic Atherosclerosis (ICD10-I70.0). Electronically Signed   By: Lowella Grip III M.D.   On: 01/29/2018 07:08     Medications:   . calcium gluconate    . DAPTOmycin (CUBICIN)  IV     . [START ON 01/30/2018] fluconazole  50 mg Oral Daily  . Gerhardt's butt cream   Topical TID  . heparin  5,000  Units Subcutaneous Q8H  . insulin aspart  0-5 Units Subcutaneous QHS  . insulin aspart  0-9 Units Subcutaneous TID WC  . latanoprost  1 drop Both Eyes QHS  . metoprolol tartrate  50 mg Oral BID  . nystatin   Topical BID  . polyethylene glycol  17 g Oral Daily  . senna-docusate  1 tablet Oral BID   acetaminophen **OR** acetaminophen, clonazepam, ondansetron **OR** ondansetron (ZOFRAN) IV, sodium chloride flush  Assessment/ Plan:     Acute renal failure Baseline renal function with normal range 1.09 01/17/2018.  Puzzling etiology is not appear to have any admission of nephrotoxic agents CT scan abdomen and pelvis reveals generalized anasarca.  Have discontinued IV fluids.  Have discontinued Ancef.  Urinalysis revealed a dirty appearing sediment with red cells white cells and protein.  I am wondering whether renal biopsy would be indicated in order to better identify the pathology of his acute renal failure.  There has been no response to diuretics.  Will have temporary dialysis catheter placed.  And dialysis scheduled.  Metabolic acidosis should correct with dialysis  Oral candidiasis Diflucan ordered  MSSA bacteremia patient started on daptomycin appreciate Dr.  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

## 2018-01-29 NOTE — Progress Notes (Signed)
Patient taken off BiPAP and placed on 3LNC. Seems to be doing very well. RT to monitor as needed

## 2018-01-29 NOTE — Significant Event (Addendum)
Rapid Response Event Note  Overview:  Called for resp distress Time Called: 1507 Arrival Time: 1510 Event Type: Respiratory  Initial Focused Assessment:  Alert - warm and dry - speaking broken sentences - resps rapid and labored rate 24 - using accessory muscles - bil BS present - decreased in bases - generalized edema noted - states his breathing has gotten progressively worse this afternoon.  Was 100% on RA - now 97% on 4 liters. Friction rub heard and palpated right side of sternum - patient with hx CABG 10 years ago with post op infection - ? Sternal rub.  No crepitus.  SR 87.  BP 155/87.  RN reports IR unable to schedule HD cath insertion today - advised her to call Dr. Justin King and Dr. Tana King with update as notes state patient needs HD today.  CBG 69.    Interventions:  Called for stat PCXR.  Bipap to room. Placed on NRB mask.  Dr. Tana King to bedside.  PCCM has been called to place temporary HD cath.  PCCM team to bedside - Bipap initiated - patient with some decrease in WOB.  Tol HD cath placement right jugular - PCXR done - PCCM cleared catheter for use.  Patient transported urgently to HD suite with Bipap.  Handoff to [employer] - advised to follow blood sugar - had hypoglycemia this AM.      Plan of Care (if not transferred):  For admission to 6E07 progressive care status post HD.  Report given to 6E staff by [name].    Event Summary: Name of Physician Notified: Dr. Tana King at ([facility])  Name of Consulting Physician Notified: Dr. Justin King - Renal      PCCM for line placement at ([facility] R[facility])  Outcome: Transferred (Comment)  Event End Time: (transferred to HD for urgent tx - for admiisson to 6E07 post HD)  [name]

## 2018-01-29 NOTE — Consult Note (Signed)
NAME:  [name], MRN:  [id number], DOB:  03-04-1956, LOS: 2 ADMISSION DATE:  01/27/2018, CONSULTATION DATE:  9/25 REFERRING MD:  Tana Coast, CHIEF COMPLAINT:  Hypoxia  Brief History   62 year old male with recent admission for MSSA bacteremia and TV endocarditis now admitted for AKI. He did not respond to lasix and developed worsening dyspnea. He had plans for dialysis, however, his respiratory status declined before he was able to get this. BiPAP was started. PCCM asked to see for further evaluation.   Past Medical History  Type 2 DM, CAD (CABG x 5 in 2010), HTN, HLD, BKA left 2016, BKA R 2019  Significant Hospital Events   9/10 >9/18 admit for sepsis (osteomyelitis) and underwent amputation. Developed MSSA endocarditis.  9/23 admit  Consults: date of consult/date signed off & final recs:  Nephrology  Procedures (surgical and bedside):  HD cath RIJ 9/25 >  Significant Diagnostic Tests:  CT renal stone study 9/25 > Loss of cortex along the anterior inferior aspect of the T12 vertebral body as well as lucency in the posterior aspect of the L1 vertebral body. Question a degree of discitis/osteomyelitis in this Region Generalized anasarca. Areas of partially loculated ascites as well as mild mesenteric thickening in the left lower quadrant. No evident abscess in the abdomen or pelvis. Cholelithiasis with probable sludge in the gallbladder as well. Gallbladder mildly distended. No evident hydronephrosis on either side. No renal or ureteral calculus on either side.  Micro Data:  Urine 9/23 > Neg  Antimicrobials:  Cefazolin 9/10 > 9/25 Daptomycin 9/25 >>>  Subjective:    Objective   Blood pressure (!) 142/83, pulse 90, temperature 97.6 F (36.4 C), temperature source Axillary, resp. rate 16, height _0  (1.778 m), weight 95.2 kg, SpO2 100 %.    Vent Mode: BIPAP;PCV FiO2 (%):  [100 %] 100 % Set Rate:  [12 bmp] 12 bmp PEEP:  [5 cmH20] 5 cmH20   Intake/Output Summary (Last 24  hours) at 01/29/2018 1810 Last data filed at 01/29/2018 1400 Gross per 24 hour  Intake 1937 ml  Output 10 ml  Net 1927 ml   Filed Weights   01/27/18 1803  Weight: 95.2 kg    Examination: General: adult male in respiratory distress HENT: [place]/AT, PERRL, mild JVD Lungs: Coarse bilaterally Cardiovascular: Tachy, regular Abdomen: Soft, non-tender, non-distended Extremities: Bilateral BKA. Recent on R. Dressing CDI Neuro: Alert, oriented, non-focal  Resolved Hospital Problem list     Assessment & Plan:   Acute hypoxemic respiratory failure: almost certainly secondary to pulmonary edema. If he doesn't improve with HD, may need to consider ARDS given his recent course.  - Transfer to ICU for closer monitoring.  - Agree with BiPAP, tolerating well. Continue PRN overnight.  - Keep O2 sats > 90-95% - HD for volume removal.    Acute renal failure: Etiology unclear. Nephrology following. No response to diuretics. Baseline creatinine 1.1, now 10.  - For HD today  - Trend BMP - Nephrology following - Strict I&O  Electrolyte abnormalities: Hyperkalemia, hyponatremia, hypocalcemia - HD - Calcium given by primary today. Repeat BMP in AM  MSSA bacteremia secondary to R foot osteo and now TV endocarditis. CT 9/25 demonstrates loss of cortex along T12 concerning for discitis/osteomyelitis.  - ID following, transitioned to daptomycin - Trend WBC and fever curve.  - May need MRI T spine once he can tolerate.   HTN history of CAD and CABG as well.  - Continuing metoprolol  DM2: has  been hypoglycemic this admission. A1C 8.5. - CBG q 4 hours - D50 as needed.  - SSI  S/p Recent BKA - Dr. Sharol Given following, needs outpatient follow up for suture removal in 2 weeks.    Disposition / Summary of Today's Plan 01/29/18   Maintain on BiPAP and start HD. Hopefully post/HD he will improve and be able to come off BiPAP.     Diet: Renal Pain/Anxiety/Delirium protocol (if indicated): na VAP  protocol (if indicated): na DVT prophylaxis: SQ heparin GI prophylaxis: Protonix Hyperglycemia protocol: SSI ACHS Mobility: Bedrest until feeling better Code Status: Full Family Communication: no family present. Patient updated.   Labs   CBC: Recent Labs  Lab 01/27/18 1819 01/28/18 0059 01/29/18 0346 01/29/18 1707  WBC 18.6* 15.5* 13.2* 13.1*  NEUTROABS  --   --  11.0*  --   HGB 13.2 12.6* 10.8* 11.6*  HCT 39.7 36.8* 32.8* 34.6*  MCV 86.3 84.2 86.1 85.4  PLT 165 146* 132* 009    Basic Metabolic Panel: Recent Labs  Lab 01/27/18 1819 01/28/18 0059 01/29/18 0346 01/29/18 1707  NA 128* 129* 127* 130*  K 4.6 4.9 4.9 5.1  CL 94* 96* 97* 99  CO2 13* 12* 12* 13*  GLUCOSE 53* 123* 67* 65*  BUN 84* 88* 94* 93*  CREATININE 9.60* 9.60* 10.26* 10.14*  CALCIUM 6.6* 6.5* 5.8* 6.3*  PHOS  --   --   --  10.1*   GFR: Estimated Creatinine Clearance: 8.9 mL/min (A) (by C-G formula based on SCr of 10.14 mg/dL (H)). Recent Labs  Lab 01/27/18 1819 01/28/18 0059 01/29/18 0346 01/29/18 1707  WBC 18.6* 15.5* 13.2* 13.1*    Liver Function Tests: Recent Labs  Lab 01/27/18 1819 01/29/18 1707  AST 19  --   ALT <5  --   ALKPHOS 66  --   BILITOT 0.5  --   PROT 5.8*  --   ALBUMIN 2.2* 2.1*   No results for input(s): LIPASE, AMYLASE in the last 168 hours. No results for input(s): AMMONIA in the last 168 hours.  ABG    Component Value Date/Time   PHART 7.432 04/21/2009 1757   PCO2ART 29.6 (L) 04/21/2009 1757   PO2ART 95.0 04/21/2009 1757   HCO3 19.8 (L) 04/21/2009 1757   TCO2 23 07/27/2014 2142   ACIDBASEDEF 4.0 (H) 04/21/2009 1757   O2SAT 98.0 04/21/2009 1757     Coagulation Profile: No results for input(s): INR, PROTIME in the last 168 hours.  Cardiac Enzymes: No results for input(s): CKTOTAL, CKMB, CKMBINDEX, TROPONINI in the last 168 hours.  HbA1C: Hgb A1c MFr Bld  Date/Time Value Ref Range Status  01/16/2018 03:27 AM 8.5 (H) 4.8 - 5.6 % Final    Comment:     (NOTE) Pre diabetes:          5.7%-6.4% Diabetes:              >6.4% Glycemic control for   <7.0% adults with diabetes   07/28/2014 05:22 AM 9.9 (H) 4.8 - 5.6 % Final    Comment:    (NOTE)         Pre-diabetes: 5.7 - 6.4         Diabetes: >6.4         Glycemic control for adults with diabetes: <7.0     CBG: Recent Labs  Lab 01/28/18 1642 01/28/18 2131 01/29/18 0734 01/29/18 1220 01/29/18 1256  GLUCAP 87 76 63* 59* 122*    Admitting History of Present Illness.  62 year old male with PMH as below, which is significant for CAD s/p CABG, DM with peripheral disease s/p amputation, HTN, HLD, and GERD. He was recently admitted for sepsis secondary to RLE osteomyelitis. 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 He was discharged 9/18 to SNF with plans for IV cefazolin through 03/2018.   Unfortunately he began to notice his urine output drop off and developed suprapubic pain. Labs done at [facility] showed elevated creatinine and he was sent tot he ED 9/23. Laboratory evaluation in the ED significant for creatinine 9.6 (was 1.13 a few days earlier), Na 128, BUN 84. He was admitted to the hospitalist service for management of acute renal failure. Nephrology was consulted and tried to diurese him, however, this was unsuccessful. By 9/25 he remained anuric and developed respiratory distress requiring BiPAP. PCCM asked to see for dialysis access and evaluation for dyspnea.   Review of Systems:   Bolds are positive  Constitutional: weight loss, gain, night sweats, Fevers, chills, fatigue .  HEENT: headaches, Sore throat, sneezing, nasal congestion, post nasal drip, Difficulty swallowing, Tooth/dental problems, visual complaints visual changes, ear ache CV:  chest pain, radiates:,Orthopnea, PND, swelling in lower extremities, dizziness, palpitations, syncope.  GI  heartburn, indigestion, abdominal pain,  nausea, vomiting, diarrhea, change in bowel habits, loss of appetite, bloody stools.  Resp: cough, productive:, hemoptysis, dyspnea, chest pain, pleuritic.  Skin: rash or itching or icterus GU: dysuria, change in color of urine, urgency or frequency. flank pain, hematuria  MS: joint pain or swelling. decreased range of motion  Psych: change in mood or affect. depression or anxiety.  Neuro: difficulty with speech, weakness, numbness, ataxia    Past Medical History  He,  has a past medical history of Bicuspid aortic valve, CAD (coronary artery disease) (October 2010), GERD (gastroesophageal reflux disease), Heart murmur, Hyperlipidemia, Hypertension, Myocardial infarction ([place]) (2010), PVD (peripheral vascular disease) ([place]), and Type II diabetes mellitus ([place]).   Surgical History    Past Surgical History:  Procedure Laterality Date  . AMPUTATION Left 2011   great and second toes  . AMPUTATION  07/27/2011   Procedure: AMPUTATION FOOT;  Surgeon: Newt Minion, MD;  Location: [place];  Service: Orthopedics;  Laterality: Left;  Left Midfoot Amputation   . AMPUTATION Left 12/08/2014   Procedure: Left Below Knee Amputation;  Surgeon: Newt Minion, MD;  Location: [place];  Service: Orthopedics;  Laterality: Left;  . AMPUTATION Right 01/17/2018   Procedure: RIGHT BELOW KNEE AMPUTATION;  Surgeon: Newt Minion, MD;  Location: [place];  Service: Orthopedics;  Laterality: Right;  . AMPUTATION TOE Right    "all my toes on my right foot have been amputated"  . AORTIC VALVE REPLACEMENT  2010   Pericardial tissue valve  . CARDIAC CATHETERIZATION  2010  . CARDIAC VALVE REPLACEMENT    . CATARACT EXTRACTION W/ INTRAOCULAR LENS  IMPLANT, BILATERAL Bilateral   . CORONARY ARTERY BYPASS GRAFT  2010   "CABG X5"  . TEE WITHOUT CARDIOVERSION N/A 01/21/2018   Procedure: TRANSESOPHAGEAL ECHOCARDIOGRAM (TEE);  Surgeon: Buford Dresser, MD;  Location: [facility];  Service: Cardiovascular;  Laterality: N/A;      Social History   Social History   Socioeconomic History  . Marital status: Single    Spouse name: Not on file  . Number of children: Not on file  . Years of education: Not on file  . Highest education level:  Not on file  Occupational History  . Occupation: Publishing rights manager: [employer]  . Financial resource strain: Not on file  . Food insecurity:    Worry: Not on file    Inability: Not on file  . Transportation needs:    Medical: Not on file    Non-medical: Not on file  Tobacco Use  . Smoking status: Former Smoker    Packs/day: 1.00    Years: 24.00    Pack years: 24.00    Types: Cigarettes    Last attempt to quit: 10/15/2000    Years since quitting: 17.3  . Smokeless tobacco: Never Used  Substance and Sexual Activity  . Alcohol use: Yes    Comment: 01/15/2018 "1-2 drinks/month"  . Drug use: No  . Sexual activity: Not Currently  Lifestyle  . Physical activity:    Days per week: Not on file    Minutes per session: Not on file  . Stress: Not on file  Relationships  . Social connections:    Talks on phone: Not on file    Gets together: Not on file    Attends religious service: Not on file    Active member of club or organization: Not on file    Attends meetings of clubs or organizations: Not on file    Relationship status: Not on file  . Intimate partner violence:    Fear of current or ex partner: Not on file    Emotionally abused: Not on file    Physically abused: Not on file    Forced sexual activity: Not on file  Other Topics Concern  . Not on file  Social History Narrative   The patient lives alone in [place].   He is on a diabetic diet with regular exercise at work, but little outside of his work.  ,  reports that he quit smoking about 17 years ago. His smoking use included cigarettes. He has a 24.00 pack-year smoking history. He has never used smokeless tobacco. He reports that he drinks alcohol. He reports that he  does not use drugs.   Family History   His family history includes Coronary artery disease in his unknown relative; Diabetes in his unknown relative; Diabetes type II in his mother and sister; Heart attack in his brother and father; Heart disease in his father; Stroke in his brother and father.   Allergies Allergies  Allergen Reactions  . Ace Inhibitors Cough     Home Medications  Prior to Admission medications   Medication Sig Start Date End Date Taking? Authorizing Provider  aspirin 325 MG tablet Take 325 mg by mouth daily.     Yes [provider]  bisacodyl (DULCOLAX) 10 MG suppository Place 10 mg rectally once as needed (for constipation not relieved by Milk of Magnesia).    Yes [provider]  Calcium Citrate-Vitamin D (CALCIUM CITRATE + D3) 250-200 MG-UNIT TABS Take 1 tablet by mouth 2 (two) times daily.   Yes [provider]  ceFAZolin (ANCEF) IVPB Inject 2 g into the vein every 8 (eight) hours. Indication:  MSSA bacteremia/endocarditis Last Day of Therapy:  03/08/18 Labs - Once weekly:  CBC/D and BMP, Labs - Every other week:  ESR and CRP 01/22/18 03/09/18 Yes Florencia Reasons, MD  cetirizine (ZYRTEC) 10 MG tablet Take 10 mg by mouth daily.   Yes [provider]  Dulaglutide (TRULICITY) 1.5 SA/6.3KZ SOPN Inject 1.5 mg into the skin every Monday.  Yes [provider]  glipiZIDE (GLUCOTROL XL) 10 MG 24 hr tablet Take 10 mg by mouth daily.  04/02/16  Yes [provider]  insulin aspart (NOVOLOG) 100 UNIT/ML injection Before each meal 3 times a day, 140-199 - 2 units, 200-250 - 4 units, 251-299 - 6 units,  300-349 - 8 units,  350 or above 10 units. Insulin PEN if approved, provide syringes and needles if needed. Patient taking differently: Inject 2-10 Units into the skin See admin instructions. Inject 2-10 units into the skin three times a day before meals, per sliding scale: BGL 140-199 = 2 units; 200-250 = 4 units; 251-299 = 6 units;  300-349 = 8 units; 350 or greater = 10 units 01/22/18  Yes Florencia Reasons, MD  ketoconazole (NIZORAL) 2 % cream Apply 1 application topically See admin instructions. Apply to both buttocks and "GL fold" rash two times a day   Yes [provider]  latanoprost (XALATAN) 0.005 % ophthalmic solution Place 1 drop into both eyes at bedtime. 07/13/14  Yes [provider]  losartan (COZAAR) 50 MG tablet Take 50 mg by mouth daily.  07/19/14  Yes [provider]  magnesium hydroxide (MILK OF MAGNESIA) 400 MG/5ML suspension Take 30 mLs by mouth once as needed for mild constipation.   Yes [provider]  metoprolol (LOPRESSOR) 50 MG tablet TAKE 2 TABLETS BY MOUTH EVERY MORNING AND 1 EVERY EVENING Patient taking differently: Take 50-100 mg by mouth See admin instructions. Take 100 mg by mouth in the morning and 50 mg in the evening 12/30/15  Yes Fenton Malling M, PA-C  Multiple Vitamin (MULTIVITAMIN) tablet Take 1 tablet by mouth daily.     Yes [provider]  naproxen sodium (ANAPROX) 220 MG tablet Take 220 mg by mouth 2 (two) times daily as needed (pain).   Yes [provider]  oxyCODONE (OXY IR/ROXICODONE) 5 MG immediate release tablet Take 1 tablet (5 mg total) by mouth every 4 (four) hours as needed for breakthrough pain (pain score 4-6). Patient taking differently: Take 5 mg by mouth every 4 (four) hours as needed for breakthrough pain (for pain).  01/22/18  Yes Florencia Reasons, MD  pantoprazole (PROTONIX) 40 MG tablet Take 40 mg by mouth daily.     Yes [provider]  polyethylene glycol (MIRALAX / GLYCOLAX) packet Take 17 g by mouth daily as needed for mild constipation. 01/22/18  Yes Florencia Reasons, MD  simvastatin (ZOCOR) 20 MG tablet Take 20 mg by mouth daily at 6 PM.  08/17/14  Yes [provider]  Sodium Phosphates (RA SALINE ENEMA) 19-7 GM/118ML ENEM Place 1 enema rectally once as needed (for constipation not relieved by Dulcolax suppository and  notify MD if no relief from enema).   Yes [provider]  TOUJEO SOLOSTAR 300 UNIT/ML SOPN Inject 30 Units into the skin See admin instructions. Use up to 100 units daily Patient taking differently: Inject 30 Units into the skin daily.  01/22/18  Yes Florencia Reasons, MD  furosemide (LASIX) 40 MG tablet Take 40 mg by mouth 2 (two) times daily.    07/23/11  [provider]     Georgann Housekeeper, AGACNP-BC [place] [facility] Pager [phone] or [phone]  01/29/2018 6:10 PM

## 2018-01-29 NOTE — Progress Notes (Signed)
   Patient Status: [name] - In-pt  Assessment and Plan: Patient in need of venous access for initiation of dialysis.  Patient with acute renal failure and anasarca.  Request for temp cath for dialysis initiation.  Risks and benefits discussed with the patient including, but not limited to bleeding, infection, vascular injury.  All of the patient's questions were answered, patient is agreeable to proceed. Consent signed and in chart.  ______________________________________________________________________   History of Present Illness: [name] is a 62 y.o. male with history significant for IDDM, CAD, HTN, and recent sepsis with MSSA bacteremia and TV endocarditis who presented to [facility] ED with abnormal labs and decreased UOP. Now with acute renal failure and anasarca.  He will need dialysis.  Request made for temporary dialysis catheter placement.   Allergies and medications reviewed.   Review of Systems: A 12 point ROS discussed and pertinent positives are indicated in the HPI above.  All other systems are negative.  Review of Systems  Constitutional: Negative for fatigue and fever.  Respiratory: Positive for shortness of breath. Negative for cough.   Cardiovascular: Negative for chest pain.  Gastrointestinal: Negative for abdominal pain.  Musculoskeletal: Negative for back pain.  Psychiatric/Behavioral: Negative for behavioral problems and confusion.    Vital Signs: BP (!) 148/81 (BP Location: Left Arm)   Pulse 85   Temp 97.9 F (36.6 C) (Oral)   Resp (!) 21   Ht 5\' 10"  (1.778 m)   Wt 209 lb 14.1 oz (95.2 kg)   SpO2 98%   BMI 30.11 kg/m   Physical Exam  Constitutional: He is oriented to person, place, and time. He appears well-developed. No distress.  Cardiovascular: Normal rate, regular rhythm and normal heart sounds.  Pulmonary/Chest:  With increased work of breathing, no distress.  No audible wheezes.   Musculoskeletal:  Generalized edema  Neurological: He  is alert and oriented to person, place, and time.  Skin: Skin is warm and dry. He is not diaphoretic.  Psychiatric: He has a normal mood and affect. His behavior is normal. Judgment and thought content normal.     Imaging reviewed.   Labs:  COAGS: Recent Labs    01/14/18 1926 01/15/18 0246 01/21/18 0655  INR 1.43 1.72 1.18  APTT 33  --   --     BMP: Recent Labs    01/22/18 0446 01/27/18 1819 01/28/18 0059 01/29/18 0346  NA 133* 128* 129* 127*  K 4.3 4.6 4.9 4.9  CL 100 94* 96* 97*  CO2 25 13* 12* 12*  GLUCOSE 287* 53* 123* 67*  BUN 21 84* 88* 94*  CALCIUM 8.3* 6.6* 6.5* 5.8*  CREATININE 1.13 9.60* 9.60* 10.26*  GFRNONAA >60 5* 5* 5*  GFRAA >60 6* 6* 6*     Electronically Signed: Docia Barrier, PA 01/29/2018, 2:56 PM   I spent a total of 15 minutes in face to face in clinical consultation, greater than 50% of which was counseling/coordinating care for venous access.

## 2018-01-30 DIAGNOSIS — Z978 Presence of other specified devices: Secondary | ICD-10-CM

## 2018-01-30 DIAGNOSIS — B370 Candidal stomatitis: Secondary | ICD-10-CM

## 2018-01-30 DIAGNOSIS — R011 Cardiac murmur, unspecified: Secondary | ICD-10-CM

## 2018-01-30 LAB — GLUCOSE, CAPILLARY
GLUCOSE-CAPILLARY: 68 mg/dL — AB (ref 70–99)
GLUCOSE-CAPILLARY: 106 mg/dL — AB (ref 70–99)
GLUCOSE-CAPILLARY: 97 mg/dL (ref 70–99)
Glucose-Capillary: 69 mg/dL — ABNORMAL LOW (ref 70–99)
Glucose-Capillary: 89 mg/dL (ref 70–99)
Glucose-Capillary: 121 mg/dL — ABNORMAL HIGH (ref 70–99)
Glucose-Capillary: 186 mg/dL — ABNORMAL HIGH (ref 70–99)
Glucose-Capillary: 228 mg/dL — ABNORMAL HIGH (ref 70–99)

## 2018-01-30 LAB — BASIC METABOLIC PANEL
ANION GAP: 16 — AB (ref 5–15)
BUN: 78 mg/dL — ABNORMAL HIGH (ref 8–23)
CALCIUM: 6.7 mg/dL — AB (ref 8.9–10.3)
CHLORIDE: 100 mmol/L (ref 98–111)
CO2: 16 mmol/L — AB (ref 22–32)
Creatinine, Ser: 9.38 mg/dL — ABNORMAL HIGH (ref 0.61–1.24)
GFR calc non Af Amer: 5 mL/min — ABNORMAL LOW (ref >60)
GFR, EST AFRICAN AMERICAN: 6 mL/min — AB (ref >60)
GLUCOSE: 83 mg/dL (ref 70–99)
POTASSIUM: 4.4 mmol/L (ref 3.5–5.1)
Sodium: 132 mmol/L — ABNORMAL LOW (ref 135–145)

## 2018-01-30 LAB — CBC
HEMATOCRIT: 34.6 % — AB (ref 39.0–52.0)
Hemoglobin: 11.2 g/dL — ABNORMAL LOW (ref 13.0–17.0)
MCH: 28 pg (ref 26.0–34.0)
MCHC: 32.4 g/dL (ref 30.0–36.0)
MCV: 86.5 fL (ref 78.0–100.0)
PLATELETS: 173 10*3/uL (ref 150–400)
RBC: 4 MIL/uL — ABNORMAL LOW (ref 4.22–5.81)
RDW: 14.5 % (ref 11.5–15.5)
WBC: 11 10*3/uL — ABNORMAL HIGH (ref 4.0–10.5)

## 2018-01-30 LAB — HEPATITIS B SURFACE ANTIGEN: Hepatitis B Surface Ag: NEGATIVE

## 2018-01-30 LAB — CK: Total CK: 33 U/L — ABNORMAL LOW (ref 49–397)

## 2018-01-30 LAB — HEPATITIS B SURFACE ANTIBODY,QUALITATIVE: Hep B S Ab: REACTIVE

## 2018-01-30 LAB — HEPATITIS B CORE ANTIBODY, TOTAL: Hep B Core Total Ab: NEGATIVE

## 2018-01-30 MED ORDER — "THROMBI-PAD 3""X3"" EX PADS"
1.0000 | MEDICATED_PAD | Freq: Once | CUTANEOUS | Status: AC
  Administered 2018-01-30: 1 via TOPICAL
  Filled 2018-01-30: qty 1

## 2018-01-30 MED ORDER — LIDOCAINE HCL (PF) 1 % IJ SOLN: 5.0000 mL | INTRAMUSCULAR | Status: DC | PRN

## 2018-01-30 MED ORDER — HEPARIN SODIUM (PORCINE) 1000 UNIT/ML IJ SOLN
INTRAMUSCULAR | Status: AC
  Administered 2018-01-30: 1.2 [IU] via INTRAVENOUS_CENTRAL
  Filled 2018-01-30: qty 3

## 2018-01-30 MED ORDER — SODIUM CHLORIDE 0.9 % IV SOLN: 100.0000 mL | INTRAVENOUS | Status: DC | PRN

## 2018-01-30 MED ORDER — LIDOCAINE-PRILOCAINE 2.5-2.5 % EX CREA
1.0000 "application " | TOPICAL_CREAM | CUTANEOUS | Status: DC | PRN
  Filled 2018-01-30: qty 5

## 2018-01-30 MED ORDER — CHLORHEXIDINE GLUCONATE CLOTH 2 % EX PADS: 6.0000 | MEDICATED_PAD | Freq: Every day | CUTANEOUS | Status: DC

## 2018-01-30 MED ORDER — ALTEPLASE 2 MG IJ SOLR
2.0000 mg | Freq: Once | INTRAMUSCULAR | Status: DC | PRN
  Filled 2018-01-30: qty 2

## 2018-01-30 MED ORDER — DEXTROSE 50 % IV SOLN
25.0000 mL | INTRAVENOUS | Status: AC
  Administered 2018-01-30: 25 mL via INTRAVENOUS

## 2018-01-30 MED ORDER — DEXTROSE 5 % IV SOLN
INTRAVENOUS | Status: DC
  Administered 2018-01-30: 06:00:00 via INTRAVENOUS

## 2018-01-30 MED ORDER — DEXTROSE 50 % IV SOLN
INTRAVENOUS | Status: AC
  Filled 2018-01-30: qty 50

## 2018-01-30 MED ORDER — HEPARIN SODIUM (PORCINE) 1000 UNIT/ML DIALYSIS
1000.0000 [IU] | INTRAMUSCULAR | Status: DC | PRN
  Administered 2018-01-30: 1.2 [IU] via INTRAVENOUS_CENTRAL

## 2018-01-30 MED ORDER — SODIUM CHLORIDE 0.9 % IV SOLN
700.0000 mg | INTRAVENOUS | Status: DC
  Administered 2018-01-30 – 2018-02-21 (×12): 700 mg via INTRAVENOUS
  Filled 2018-01-30 (×14): qty 14

## 2018-01-30 MED ORDER — PENTAFLUOROPROP-TETRAFLUOROETH EX AERO: 1.0000 "application " | INHALATION_SPRAY | CUTANEOUS | Status: DC | PRN

## 2018-01-30 NOTE — Progress Notes (Signed)
CSW continues to follow pt for further needs at this time. CSW is aware that pt is from [facility] and no longer wishes to return back to the facility once medically stable for discharge.   CSW to continue to follow for further needs at this time.    Virgie Dad Amberrose Friebel, MSW, [facility] Clinical Social Worker [phone]

## 2018-01-30 NOTE — Progress Notes (Signed)
[facility] Progress Note Patient Name: [name] DOB: Sep 30, 1955 MRN: [id number]   Date of Service  01/30/2018  HPI/Events of Note  Hypoglycemia, oozing around vascular access site.  eICU Interventions  D5 W at 75 ml/ Hr Thrombipad to affected dialysis vascath site.        Kerry Kass Aarti Mankowski 01/30/2018, 4:10 AM

## 2018-01-30 NOTE — Plan of Care (Signed)
Pt in ICU, seen by Dr Vaughan Browner. I will sign off, [facility] will assume care when stable per CCM and out of ICU.    Estill Cotta M.D. [facility] 01/30/2018, 10:26 AM  Pager: [phone]

## 2018-01-30 NOTE — Progress Notes (Signed)
[facility] ROUNDING NOTE   Subjective:   Continues to be anuric.  Was transferred to the intensive care unit after developing acute dyspnea required dialysis with removal of 2 L 01/30/2018 O2 sats 96% nasal cannula 3 L blood pressure 140/80 temperature afebrile 98.3 pulse 91 Labs sodium 132 potassium 4.4 CO2 16 BUN 78 creatinine 9.38 calcium 6.7 Albumin 2.1 phosphorus 10.1  Objective:  Vital signs in last 24 hours:  Temp:  [97.6 F (36.4 C)-99 F (37.2 C)] 98.3 F (36.8 C) (09/26 0800) Pulse Rate:  [85-100] 89 (09/26 0800) Resp:  [14-28] 14 (09/26 0800) BP: (100-170)/(52-90) 123/63 (09/26 0800) SpO2:  [95 %-100 %] 97 % (09/26 0800) FiO2 (%):  [100 %] 100 % (09/25 1625) Weight:  [97.2 kg-97.5 kg] 97.5 kg (09/26 0500)  Weight change:  Filed Weights   01/27/18 1803 01/29/18 1830 01/30/18 0500  Weight: 95.2 kg 97.2 kg 97.5 kg    Intake/Output: I/O last 3 completed shifts: In: 2178.5 [P.O.:300; I.V.:1764.5; IV Piggyback:114] Out: 2010 [Urine:10; Other:2000]   Intake/Output this shift:  Total I/O In: 75.1 [I.V.:75.1] Out: -   CVS- RRR systolic murmur RS-  decreased breath sounds at base ABD-distended abdomen EXT-generalized edema   Basic Metabolic Panel: Recent Labs  Lab 01/28/18 0059 01/29/18 0346 01/29/18 1707 01/29/18 2000 01/30/18 0500  NA 129* 127* 130* 132* 132*  K 4.9 4.9 5.1 4.3 4.4  CL 96* 97* 99 101 100  CO2 12* 12* 13* 16* 16*  GLUCOSE 123* 67* 65* 78 83  BUN 88* 94* 93* 74* 78*  CREATININE 9.60* 10.26* 10.14* 8.97* 9.38*  CALCIUM 6.5* 5.8* 6.3* 6.8* 6.7*  PHOS  --   --  10.1*  --   --     Liver Function Tests: Recent Labs  Lab 01/27/18 1819 01/29/18 1707  AST 19  --   ALT <5  --   ALKPHOS 66  --   BILITOT 0.5  --   PROT 5.8*  --   ALBUMIN 2.2* 2.1*   No results for input(s): LIPASE, AMYLASE in the last 168 hours. No results for input(s): AMMONIA in the last 168 hours.  CBC: Recent Labs  Lab 01/27/18 1819  01/28/18 0059 01/29/18 0346 01/29/18 1707 01/30/18 0604  WBC 18.6* 15.5* 13.2* 13.1* 11.0*  NEUTROABS  --   --  11.0*  --   --   HGB 13.2 12.6* 10.8* 11.6* 11.2*  HCT 39.7 36.8* 32.8* 34.6* 34.6*  MCV 86.3 84.2 86.1 85.4 86.5  PLT 165 146* 132* 157 173    Cardiac Enzymes: Recent Labs  Lab 01/30/18 0500  CKTOTAL 33*    BNP: Invalid input(s): POCBNP  CBG: Recent Labs  Lab 01/29/18 1829 01/29/18 1945 01/30/18 0309 01/30/18 0335 01/30/18 0717  GLUCAP 62* 108* 68* 106* 59    Microbiology: Results for orders placed or performed during the hospital encounter of 01/27/18  Urine culture     Status: None   Collection Time: 01/28/18  4:29 PM  Result Value Ref Range Status   Specimen Description URINE, CATHETERIZED  Final   Special Requests NONE  Final   Culture   Final    NO GROWTH Performed at [facility], [place] [address], [place], [place] [address]    Report Status 01/29/2018 FINAL  Final    Coagulation Studies: No results for input(s): LABPROT, INR in the last 72 hours.  Urinalysis: No results for input(s): COLORURINE, LABSPEC, PHURINE, GLUCOSEU, HGBUR, BILIRUBINUR, KETONESUR, PROTEINUR, UROBILINOGEN, NITRITE, LEUKOCYTESUR in  the last 72 hours.  Invalid input(s): APPERANCEUR    Imaging: Dg Chest Port 1 View  Result Date: 01/29/2018 CLINICAL DATA:  Severe shortness of breath. EXAM: PORTABLE CHEST 1 VIEW COMPARISON:  01/27/2018. FINDINGS: Stable enlarged cardiac silhouette, post CABG changes and prosthetic aortic valve. Interval right jugular catheter with its tip in the superior vena cava. No pneumothorax. Stable right PICC with its tip in the inferior aspect of the superior vena cava. Increased prominence of the interstitial markings, including Kerley lines with an interval small left pleural effusion. Stable left lung postsurgical changes. No acute bony abnormality. IMPRESSION: Interval changes of congestive heart failure with stable cardiomegaly.  Electronically Signed   By: Claudie Revering M.D.   On: 01/29/2018 16:24   Ct Renal Stone Study  Result Date: 01/29/2018 CLINICAL DATA:  anuria EXAM: CT ABDOMEN AND PELVIS WITHOUT CONTRAST TECHNIQUE: Multidetector CT imaging of the abdomen and pelvis was performed following the standard protocol without oral or IV contrast. COMPARISON:  January 14, 2018 FINDINGS: Lower chest: There are pleural effusions bilaterally with consolidation in the posterior lung bases. There are foci of coronary artery calcification. There is a small hiatal hernia. Hepatobiliary: There is hepatic steatosis. No focal liver lesions are appreciable. Gallbladder is mildly distended. There are gallstones present. There may also be sludge in the gallbladder. There is no appreciable gallbladder wall thickening. There is no pericholecystic fluid. Pancreas: There is fatty infiltration in the pancreas. No focal pancreatic lesions are evident. Spleen: There is decreased attenuation in the posterior spleen in a wedge-shaped configuration consistent with splenic infarct, felt to be stable from most recent study. No new splenic lesions are evident on this noncontrast enhanced study. Adrenals/Urinary Tract: Adrenals bilaterally appear normal. There is no evident renal mass or hydronephrosis on either side. There is no appreciable renal or ureteral calculus on either side. There is a Foley catheter within the urinary bladder. There is wall thickening in portions of the urinary bladder anteriorly. Air within the urinary bladder may well be iatrogenic given recent catheterization. Stomach/Bowel: There is no appreciable bowel wall or mesenteric thickening. No evident bowel obstruction. There is no appreciable free air or portal venous air. Vascular/Lymphatic: There is aortic atherosclerosis. There is also atherosclerotic calcification in the common iliac arteries. No aneurysm evident. Major mesenteric arterial vessels are felt to be patent, although there  are foci of atherosclerotic calcification in mesenteric vessels. There is no appreciable adenopathy in the abdomen or pelvis. Reproductive: Prostate and seminal vesicles are normal in size and contour. There is calcification in the seminal vesicles. No pelvic masses evident. Other: There is ascites in the abdomen and pelvis with areas of apparent loculation of ascites in the pelvis. No abscess is evident in the abdomen or pelvis. Appendix appears unremarkable. There is mesenteric thickening in the left lower abdomen, near in area of loculated ascites. There is generalized anasarca. Musculoskeletal: Degenerative change throughout the lower thoracic and lumbar regions remain stable. There is loss of cortex with soft tissue thickening along the anterior, inferior aspect of T12 with loss of disc space in this area. There is also lucency along the posterior aspect of the L1 vertebral body, stable. A degree of discitis/osteomyelitis in this area must be of concern. No intramuscular lesions are evident. IMPRESSION: 1. Loss of cortex along the anterior inferior aspect of the T12 vertebral body as well as lucency in the posterior aspect of the L1 vertebral body. Question a degree of discitis/osteomyelitis in this region. This finding may warrant  MR pre and post-contrast to further evaluate with respect to discitis/bony destruction. 2. Generalized anasarca. Areas of partially loculated ascites as well as mild mesenteric thickening in the left lower quadrant. No evident abscess in the abdomen or pelvis. 3. Cholelithiasis with probable sludge in the gallbladder as well. Gallbladder mildly distended. Gallbladder wall not thickened. 4. No evident hydronephrosis on either side. No renal or ureteral calculus on either side. 5. Urinary bladder decompressed. Air within the urinary bladder likely is due to recent catheterization. Correlation with urinalysis to assess for possible gas-forming organism is a cause for air within the  bladder is advised. 6.  No bowel obstruction.  Appendix region appears normal. 7.  Small hiatal hernia. 8.  Aortoiliac atherosclerosis.  Coronary artery calcification. 9.  Bilateral pleural effusions with bibasilar consolidation. 10. 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 Aortic Atherosclerosis (ICD10-I70.0). Electronically Signed   By: Lowella Grip III M.D.   On: 01/29/2018 07:08     Medications:   . DAPTOmycin (CUBICIN)  IV 700 mg (01/29/18 2120)  . dextrose 75 mL/hr at 01/30/18 0800   . Chlorhexidine Gluconate Cloth  6 each Topical Q0600  . Chlorhexidine Gluconate Cloth  6 each Topical Q0600  . Chlorhexidine Gluconate Cloth  6 each Topical Q0600  . dextrose      . fluconazole  50 mg Oral Daily  . Gerhardt's butt cream   Topical TID  . heparin  5,000 Units Subcutaneous Q8H  . insulin aspart  0-5 Units Subcutaneous QHS  . insulin aspart  0-9 Units Subcutaneous TID WC  . latanoprost  1 drop Both Eyes QHS  . mouth rinse  15 mL Mouth Rinse BID  . metoprolol tartrate  50 mg Oral BID  . nystatin   Topical BID  . pantoprazole  40 mg Oral Q1200  . polyethylene glycol  17 g Oral Daily  . senna-docusate  1 tablet Oral BID   acetaminophen **OR** acetaminophen, clonazepam, ondansetron **OR** ondansetron (ZOFRAN) IV, sodium chloride flush  Assessment/ Plan:     Acute renal failure Baseline renal function with normal range 1.09 01/17/2018.  Puzzling etiology is not appear to have any admission of nephrotoxic agents CT scan abdomen and pelvis reveals generalized anasarca.  Have discontinued IV fluids.  Have discontinued Ancef.  Urinalysis revealed a dirty appearing sediment with red cells white cells and protein.  I am wondering whether renal biopsy would be indicated in order to better identify the pathology of his acute renal failure.  There has been no response to diuretics.  Developed acute dyspnea on 01/29/2018 underwent dialysis treatment with the removal of 2 L.   He will undergo further dialysis treatments today and tomorrow  Metabolic acidosis should correct with dialysis  Oral candidiasis Diflucan ordered  MSSA bacteremia patient started on daptomycin appreciate Dr. 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

## 2018-01-30 NOTE — Progress Notes (Addendum)
NAME:  Eion Timbrook, MRN:  [id number], DOB:  02-19-1956, LOS: 3 ADMISSION DATE:  01/27/2018, CONSULTATION DATE:  9/25 REFERRING MD:  Tana Coast, CHIEF COMPLAINT:  Hypoxia  Brief History   62 year old male with recent admission for MSSA bacteremia and TV endocarditis now admitted for AKI. He did not respond to lasix and developed worsening dyspnea. He had plans for dialysis, however, his respiratory status declined before he was able to get this. BiPAP was started. PCCM asked to see for further evaluation.   Past Medical History  Type 2 DM, CAD (CABG x 5 in 2010), HTN, HLD, BKA left 2016, BKA R 2019  Significant Hospital Events   9/10 >9/18 admit for sepsis (osteomyelitis) and underwent amputation. Developed MSSA endocarditis.  9/23 admit 9/25 > HD cath placement and start dialysis for resp disitress. Transfer to ICU on bipap  Consults: date of consult/date signed off & final recs:  Nephrology 9/24 > Start dialysis ID 9/24 >  Hold ancef, start dapto  Procedures (surgical and bedside):  HD cath RIJ 9/25 >  Significant Diagnostic Tests:  CT renal stone study 9/25 > Loss of cortex along the anterior inferior aspect of the T12 vertebral body as well as lucency in the posterior aspect of the L1 vertebral body. Question a degree of discitis/osteomyelitis in this Region Generalized anasarca. Areas of partially loculated ascites as well as mild mesenteric thickening in the left lower quadrant. No evident abscess in the abdomen or pelvis. Cholelithiasis with probable sludge in the gallbladder as well. Gallbladder mildly distended. No evident hydronephrosis on either side. No renal or ureteral calculus on either side.  Micro Data:  Urine 9/23 > Neg  Antimicrobials:  Cefazolin 9/10 > 9/25 Daptomycin 9/25 >>>  Subjective:  Feeling better.  Denies dyspnea Off BiPAP and on 3 L nasal cannula.  Objective   Blood pressure 123/63, pulse 89, temperature 98.3 F (36.8 C), temperature source Oral,  resp. rate 14, height 5\' 10"  (1.778 m), weight 97.5 kg, SpO2 97 %.    Vent Mode: BIPAP;PCV FiO2 (%):  [100 %] 100 % Set Rate:  [12 bmp] 12 bmp PEEP:  [5 cmH20] 5 cmH20   Intake/Output Summary (Last 24 hours) at 01/30/2018 0847 Last data filed at 01/30/2018 0700 Gross per 24 hour  Intake 541.46 ml  Output 2010 ml  Net -1468.54 ml   Filed Weights   01/27/18 1803 01/29/18 1830 01/30/18 0500  Weight: 95.2 kg 97.2 kg 97.5 kg   Examination: Gen:      No acute distress HEENT:  EOMI, sclera anicteric Neck:     No masses; no thyromegaly Lungs:    Clear to auscultation bilaterally; normal respiratory effort CV:         Regular rate and rhythm; systolic murmur. Abd:      + bowel sounds; soft, and abdominal tenderness, no guarding, rigidity. Ext:    Bilateral BKA Skin:      Warm and dry; no rash Neuro: alert and oriented x 3 Psych: normal mood and affect  Resolved Hospital Problem list     Assessment & Plan:   Acute hypoxemic respiratory failure:  Likely secondary to volume overload from renal failure -Wean down oxygen as tolerated - BiPAP as needed - Follow chest x-ray in a.m.   Acute renal failure: Etiology unclear. Nephrology following. No response to diuretics.  Started on HD -Repeat HD today per nephrology  Electrolyte abnormalities: Hyponatremia, hypocalcemia -To be corrected on HD. 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 CT 9/25 demonstrates loss of cortex along T12 concerning for discitis/osteomyelitis.  - ID following, transitioned to daptomycin - Trend WBC and fever curve.  - May need MRI T spine once he can tolerate.   HTN history of CAD and CABG as well.  - Continue lopressor  DM2: has been hypoglycemic this admission. A1C 8.5. - CBG q 4 hours - D50 as needed.  - SSI coverage  Oral thrush Continue diflucan  GI - Currently NPO. Start renal diet  S/p Recent BKA - Dr. Sharol Given following, needs outpatient follow up for  suture removal in 2 weeks.    Disposition / Summary of Today's Plan 01/30/18   Keep in ICU for now.  If he tolerates HD today then he will get transfer out.    Diet: Renal Pain/Anxiety/Delirium protocol (if indicated): na VAP protocol (if indicated): na DVT prophylaxis: SQ heparin GI prophylaxis: Protonix Hyperglycemia protocol: SSI ACHS Mobility: Bedrest until feeling better Code Status: Full Family Communication: no family present. Patient updated.   Labs   CBC: Recent Labs  Lab 01/27/18 1819 01/28/18 0059 01/29/18 0346 01/29/18 1707  WBC 18.6* 15.5* 13.2* 13.1*  NEUTROABS  --   --  11.0*  --   HGB 13.2 12.6* 10.8* 11.6*  HCT 39.7 36.8* 32.8* 34.6*  MCV 86.3 84.2 86.1 85.4  PLT 165 146* 132* 563    Basic Metabolic Panel: Recent Labs  Lab 01/28/18 0059 01/29/18 0346 01/29/18 1707 01/29/18 2000 01/30/18 0500  NA 129* 127* 130* 132* 132*  K 4.9 4.9 5.1 4.3 4.4  CL 96* 97* 99 101 100  CO2 12* 12* 13* 16* 16*  GLUCOSE 123* 67* 65* 78 83  BUN 88* 94* 93* 74* 78*  CREATININE 9.60* 10.26* 10.14* 8.97* 9.38*  CALCIUM 6.5* 5.8* 6.3* 6.8* 6.7*  PHOS  --   --  10.1*  --   --    GFR: Estimated Creatinine Clearance: 9.7 mL/min (A) (by C-G formula based on SCr of 9.38 mg/dL (H)). Recent Labs  Lab 01/27/18 1819 01/28/18 0059 01/29/18 0346 01/29/18 1707  WBC 18.6* 15.5* 13.2* 13.1*    Liver Function Tests: Recent Labs  Lab 01/27/18 1819 01/29/18 1707  AST 19  --   ALT <5  --   ALKPHOS 66  --   BILITOT 0.5  --   PROT 5.8*  --   ALBUMIN 2.2* 2.1*   No results for input(s): LIPASE, AMYLASE in the last 168 hours. No results for input(s): AMMONIA in the last 168 hours.  ABG    Component Value Date/Time   PHART 7.432 04/21/2009 1757   PCO2ART 29.6 (L) 04/21/2009 1757   PO2ART 95.0 04/21/2009 1757   HCO3 19.8 (L) 04/21/2009 1757   TCO2 23 07/27/2014 2142   ACIDBASEDEF 4.0 (H) 04/21/2009 1757   O2SAT 98.0 04/21/2009 1757     Coagulation Profile: No  results for input(s): INR, PROTIME in the last 168 hours.  Cardiac Enzymes: Recent Labs  Lab 01/30/18 0500  CKTOTAL 33*    HbA1C: Hgb A1c MFr Bld  Date/Time Value Ref Range Status  01/16/2018 03:27 AM 8.5 (H) 4.8 - 5.6 % Final    Comment:    (NOTE) Pre diabetes:          5.7%-6.4% Diabetes:              >6.4% Glycemic control for   <7.0% adults with diabetes   07/28/2014 05:22 AM 9.9 (H)  4.8 - 5.6 % Final    Comment:    (NOTE)         Pre-diabetes: 5.7 - 6.4         Diabetes: >6.4         Glycemic control for adults with diabetes: <7.0     CBG: Recent Labs  Lab 01/29/18 1829 01/29/18 1945 01/30/18 0309 01/30/18 0335 01/30/18 0717  GLUCAP 62* 108* 68* 106* 89   Admitting History of Present Illness.    62 year old male with PMH as below, which is significant for CAD s/p CABG, DM with peripheral disease s/p amputation, HTN, HLD, and GERD. He was recently admitted for sepsis secondary to RLE osteomyelitis. 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 He was discharged 9/18 to SNF with plans for IV cefazolin through 03/2018.   Unfortunately he began to notice his urine output drop off and developed suprapubic pain. Labs done at [facility] showed elevated creatinine and he was sent tot he ED 9/23. Laboratory evaluation in the ED significant for creatinine 9.6 (was 1.13 a few days earlier), Na 128, BUN 84. He was admitted to the hospitalist service for management of acute renal failure. Nephrology was consulted and tried to diurese him, however, this was unsuccessful. By 9/25 he remained anuric and developed respiratory distress requiring BiPAP. PCCM asked to see for dialysis access and evaluation for dyspnea.   The patient is critically ill with multiple organ system failure and requires high complexity decision making for assessment and support, frequent evaluation and titration of therapies,  advanced monitoring, review of radiographic studies and interpretation of complex data.   Critical Care Time devoted to patient care services, exclusive of separately billable procedures, described in this note is 35 minutes.   Marshell Garfinkel MD [facility] 01/30/2018, 9:12 AM

## 2018-01-30 NOTE — Progress Notes (Signed)
Hypoglycemic Event  CBG:68  Treatment: D50 88ml IV  Symptoms: none  Follow-up CBG: Time:0335 CBG Result:106  Possible Reasons for Event: NPO  Comments/MD notified: Hypoglycemic protocol followed    Rosaura Bolon, [facility]

## 2018-01-30 NOTE — Progress Notes (Signed)
INFECTIOUS DISEASE PROGRESS NOTE  ID: [name] is a 62 y.o. male with  Principal Problem:   Acute renal failure (ARF) ([place]) Active Problems:   Insulin-requiring or dependent type II diabetes mellitus ([place])   Essential hypertension   CAD (coronary artery disease)   Leukocytosis   S/P BKA (below knee amputation) unilateral, right ([place])   Endocarditis of tricuspid valve   Hypoglycemia due to insulin   Hyponatremia   Respiratory distress  Subjective: Transferred to MICU for sob HD last pm (and later today)  Abtx:  Anti-infectives (From admission, onward)   Start     Dose/Rate Route Frequency Ordered Stop   01/30/18 1000  fluconazole (DIFLUCAN) tablet 50 mg     50 mg Oral Daily 01/29/18 0930 02/05/18 0959   01/29/18 2000  DAPTOmycin (CUBICIN) 700 mg in sodium chloride 0.9 % IVPB     700 mg 228 mL/hr over 30 Minutes Intravenous Every 48 hours 01/29/18 1013     01/29/18 1030  fluconazole (DIFLUCAN) tablet 100 mg     100 mg Oral  Once 01/29/18 0930 01/29/18 1121   01/28/18 2200  ceFAZolin (ANCEF) IVPB 1 g/50 mL premix  Status:  Discontinued     1 g 100 mL/hr over 30 Minutes Intravenous Every 24 hours 01/28/18 1007 01/28/18 1231   01/27/18 2245  ceFAZolin (ANCEF) IVPB 2g/100 mL premix  Status:  Discontinued     2 g 200 mL/hr over 30 Minutes Intravenous Every 12 hours 01/27/18 2243 01/28/18 1007   01/27/18 2230  ceFAZolin (ANCEF) IVPB  Status:  Discontinued    Note to Pharmacy:  Indication:  MSSA bacteremia/endocarditis Last Day of Therapy:  03/08/18 Labs - Once weekly:  CBC/D and BMP, Labs - Every other week:  ESR and CRP     2 g Intravenous Every 8 hours 01/27/18 2220 01/27/18 2227   01/27/18 2230  ceFAZolin (ANCEF) IVPB 2g/100 mL premix  Status:  Discontinued     2 g 200 mL/hr over 30 Minutes Intravenous Every 8 hours 01/27/18 2227 01/27/18 2243      Medications:  Scheduled: . Chlorhexidine Gluconate Cloth  6 each Topical Q0600  . dextrose      . fluconazole   50 mg Oral Daily  . Gerhardt's butt cream   Topical TID  . heparin  5,000 Units Subcutaneous Q8H  . insulin aspart  0-5 Units Subcutaneous QHS  . insulin aspart  0-9 Units Subcutaneous TID WC  . latanoprost  1 drop Both Eyes QHS  . mouth rinse  15 mL Mouth Rinse BID  . metoprolol tartrate  50 mg Oral BID  . nystatin   Topical BID  . pantoprazole  40 mg Oral Q1200  . polyethylene glycol  17 g Oral Daily  . senna-docusate  1 tablet Oral BID    Objective: Vital signs in last 24 hours: Temp:  [97.6 F (36.4 C)-99 F (37.2 C)] 98.3 F (36.8 C) (09/26 0800) Pulse Rate:  [85-100] 89 (09/26 0800) Resp:  [14-28] 14 (09/26 0800) BP: (100-170)/(52-90) 123/63 (09/26 0800) SpO2:  [95 %-100 %] 97 % (09/26 0800) FiO2 (%):  [100 %] 100 % (09/25 1625) Weight:  [97.2 kg-97.5 kg] 97.5 kg (09/26 0500)   General appearance: alert, cooperative and no distress Neck: R IJ/HD line Resp: diminished breath sounds anterior - bilateral Cardio: regular rate and rhythm and systolic murmur: systolic ejection 3/6, crescendo at 2nd left intercostal space GI: normal findings: bowel sounds normal and soft, non-tender and  abnormal findings:  distended and decreased distension vs prev Extremities: edema mild anasarca  Lab Results Recent Labs    01/29/18 0346 01/29/18 1707 01/29/18 2000 01/30/18 0500  WBC 13.2* 13.1*  --   --   HGB 10.8* 11.6*  --   --   HCT 32.8* 34.6*  --   --   NA 127* 130* 132* 132*  K 4.9 5.1 4.3 4.4  CL 97* 99 101 100  CO2 12* 13* 16* 16*  BUN 94* 93* 74* 78*  CREATININE 10.26* 10.14* 8.97* 9.38*   Liver Panel Recent Labs    01/27/18 1819 01/29/18 1707  PROT 5.8*  --   ALBUMIN 2.2* 2.1*  AST 19  --   ALT <5  --   ALKPHOS 66  --   BILITOT 0.5  --    Sedimentation Rate No results for input(s): ESRSEDRATE in the last 72 hours. C-Reactive Protein No results for input(s): CRP in the last 72 hours.  Microbiology: Recent Results (from the past 240 hour(s))  Urine  culture     Status: None   Collection Time: 01/28/18  4:29 PM  Result Value Ref Range Status   Specimen Description URINE, CATHETERIZED  Final   Special Requests NONE  Final   Culture   Final    NO GROWTH Performed at [facility], [address], [place], [place] [address]    Report Status 01/29/2018 FINAL  Final    Studies/Results: Dg Abd 1 View  Result Date: 01/28/2018 CLINICAL DATA:  Abdominal pain EXAM: ABDOMEN - 1 VIEW COMPARISON:  CT abdomen pelvis of 01/14/2018 FINDINGS: A supine film of the abdomen shows the lumbar spine to be somewhat rotated to the right. No bowel obstruction is seen. However there is paucity of bowel gas in the left abdomen of doubtful significance in view of the recent CT scan showing no mass. Calcified vas deferens are noted in the pelvis. IMPRESSION: 1. No bowel obstruction. 2. Paucity of bowel gas in the left abdomen of doubtful significance. 3. Calcified vas deferens. Electronically Signed   By: Ivar Drape M.D.   On: 01/28/2018 11:24   Dg Chest Port 1 View  Result Date: 01/29/2018 CLINICAL DATA:  Severe shortness of breath. EXAM: PORTABLE CHEST 1 VIEW COMPARISON:  01/27/2018. FINDINGS: Stable enlarged cardiac silhouette, post CABG changes and prosthetic aortic valve. Interval right jugular catheter with its tip in the superior vena cava. No pneumothorax. Stable right PICC with its tip in the inferior aspect of the superior vena cava. Increased prominence of the interstitial markings, including Kerley lines with an interval small left pleural effusion. Stable left lung postsurgical changes. No acute bony abnormality. IMPRESSION: Interval changes of congestive heart failure with stable cardiomegaly. Electronically Signed   By: Claudie Revering M.D.   On: 01/29/2018 16:24   Ct Renal Stone Study  Result Date: 01/29/2018 CLINICAL DATA:  anuria EXAM: CT ABDOMEN AND PELVIS WITHOUT CONTRAST TECHNIQUE: Multidetector CT imaging of the abdomen and pelvis was performed  following the standard protocol without oral or IV contrast. COMPARISON:  January 14, 2018 FINDINGS: Lower chest: There are pleural effusions bilaterally with consolidation in the posterior lung bases. There are foci of coronary artery calcification. There is a small hiatal hernia. Hepatobiliary: There is hepatic steatosis. No focal liver lesions are appreciable. Gallbladder is mildly distended. There are gallstones present. There may also be sludge in the gallbladder. There is no appreciable gallbladder wall thickening. There is no pericholecystic fluid. Pancreas: There  is fatty infiltration in the pancreas. No focal pancreatic lesions are evident. Spleen: There is decreased attenuation in the posterior spleen in a wedge-shaped configuration consistent with splenic infarct, felt to be stable from most recent study. No new splenic lesions are evident on this noncontrast enhanced study. Adrenals/Urinary Tract: Adrenals bilaterally appear normal. There is no evident renal mass or hydronephrosis on either side. There is no appreciable renal or ureteral calculus on either side. There is a Foley catheter within the urinary bladder. There is wall thickening in portions of the urinary bladder anteriorly. Air within the urinary bladder may well be iatrogenic given recent catheterization. Stomach/Bowel: There is no appreciable bowel wall or mesenteric thickening. No evident bowel obstruction. There is no appreciable free air or portal venous air. Vascular/Lymphatic: There is aortic atherosclerosis. There is also atherosclerotic calcification in the common iliac arteries. No aneurysm evident. Major mesenteric arterial vessels are felt to be patent, although there are foci of atherosclerotic calcification in mesenteric vessels. There is no appreciable adenopathy in the abdomen or pelvis. Reproductive: Prostate and seminal vesicles are normal in size and contour. There is calcification in the seminal vesicles. No pelvic  masses evident. Other: There is ascites in the abdomen and pelvis with areas of apparent loculation of ascites in the pelvis. No abscess is evident in the abdomen or pelvis. Appendix appears unremarkable. There is mesenteric thickening in the left lower abdomen, near in area of loculated ascites. There is generalized anasarca. Musculoskeletal: Degenerative change throughout the lower thoracic and lumbar regions remain stable. There is loss of cortex with soft tissue thickening along the anterior, inferior aspect of T12 with loss of disc space in this area. There is also lucency along the posterior aspect of the L1 vertebral body, stable. A degree of discitis/osteomyelitis in this area must be of concern. No intramuscular lesions are evident. IMPRESSION: 1. Loss of cortex along the anterior inferior aspect of the T12 vertebral body as well as lucency in the posterior aspect of the L1 vertebral body. Question a degree of discitis/osteomyelitis in this region. This finding may warrant MR pre and post-contrast to further evaluate with respect to discitis/bony destruction. 2. Generalized anasarca. Areas of partially loculated ascites as well as mild mesenteric thickening in the left lower quadrant. No evident abscess in the abdomen or pelvis. 3. Cholelithiasis with probable sludge in the gallbladder as well. Gallbladder mildly distended. Gallbladder wall not thickened. 4. No evident hydronephrosis on either side. No renal or ureteral calculus on either side. 5. Urinary bladder decompressed. Air within the urinary bladder likely is due to recent catheterization. Correlation with urinalysis to assess for possible gas-forming organism is a cause for air within the bladder is advised. 6.  No bowel obstruction.  Appendix region appears normal. 7.  Small hiatal hernia. 8.  Aortoiliac atherosclerosis.  Coronary artery calcification. 9.  Bilateral pleural effusions with bibasilar consolidation. 10. Stable appearing splenic  infarct in the posterior splenic region. Aortic Atherosclerosis (ICD10-I70.0). Electronically Signed   By: Lowella Grip III M.D.   On: 01/29/2018 07:08     Assessment/Plan: ARF ? Thrush on tongue TV IE Prev MSSA bacteremia Splenic infarct  Total days of anbx: 17 (dapto day 1, flucon 2)  Making minimal urine.  CK normal Will watch CK on dapto On flucon for thrush Appreciate renal f/u.  Watch for return of kidney function.          Bobby Rumpf MD, FACP Infectious Diseases (pager) [phone] [email or web address] 01/30/2018, 9:35 AM  LOS: 3 days

## 2018-01-30 NOTE — Progress Notes (Signed)
Pharmacy Antibiotic Note  [name] is a 62 y.o. 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 Pharmacy has been consulted for daptomycin dosing. Scr down some today (9.38) after 2 hours of HD yesterday, estimated CrCl ~10 mL/min. Patient weighs 96.8kg, which is ~130% of his IBW of 73kg, so will dose by adjusted body weight.  9/26 CK = 33  Plan: Continue daptomycin 700 mg (~8mg /kg AdjBW) q48h Watch Scr, renal plans for RRT and adjust dose as appropriate Continue to hold statin while on daptopmycin Check weekly CK - qThursday  Height: 5\' 10"  (177.8 cm) Weight: 213 lb 6.5 oz (96.8 kg) IBW/kg (Calculated) : 73  Temp (24hrs), Avg:98.3 F (36.8 C), Min:97.6 F (36.4 C), Max:99 F (37.2 C)  Recent Labs  Lab 01/27/18 1819 01/28/18 0059 01/29/18 0346 01/29/18 1707 01/29/18 2000 01/30/18 0500 01/30/18 0604  WBC 18.6* 15.5* 13.2* 13.1*  --   --  11.0*  CREATININE 9.60* 9.60* 10.26* 10.14* 8.97* 9.38*  --     Estimated Creatinine Clearance: 9.7 mL/min (A) (by C-G formula based on SCr of 9.38 mg/dL (H)).    Allergies  Allergen Reactions  . Ace Inhibitors Cough   Thank you for allowing pharmacy to be a part of this patient's care.  Vertis Kelch, PharmD PGY1 Pharmacy Resident Phone [phone] 01/30/2018       1:19 PM

## 2018-01-31 ENCOUNTER — Inpatient Hospital Stay (HOSPITAL_COMMUNITY): Payer: Medicare Other

## 2018-01-31 DIAGNOSIS — Z992 Dependence on renal dialysis: Secondary | ICD-10-CM

## 2018-01-31 DIAGNOSIS — Z95828 Presence of other vascular implants and grafts: Secondary | ICD-10-CM

## 2018-01-31 DIAGNOSIS — I330 Acute and subacute infective endocarditis: Secondary | ICD-10-CM

## 2018-01-31 DIAGNOSIS — B9561 Methicillin susceptible Staphylococcus aureus infection as the cause of diseases classified elsewhere: Secondary | ICD-10-CM

## 2018-01-31 DIAGNOSIS — Z888 Allergy status to other drugs, medicaments and biological substances status: Secondary | ICD-10-CM

## 2018-01-31 LAB — BASIC METABOLIC PANEL
Anion gap: 14 (ref 5–15)
BUN: 63 mg/dL — AB (ref 8–23)
CO2: 18 mmol/L — AB (ref 22–32)
Calcium: 6.4 mg/dL — CL (ref 8.9–10.3)
Chloride: 99 mmol/L (ref 98–111)
Creatinine, Ser: 8.34 mg/dL — ABNORMAL HIGH (ref 0.61–1.24)
GFR calc Af Amer: 7 mL/min — ABNORMAL LOW (ref >60)
GFR calc non Af Amer: 6 mL/min — ABNORMAL LOW (ref >60)
GLUCOSE: 251 mg/dL — AB (ref 70–99)
POTASSIUM: 4.3 mmol/L (ref 3.5–5.1)
Sodium: 131 mmol/L — ABNORMAL LOW (ref 135–145)

## 2018-01-31 LAB — CBC
HEMATOCRIT: 30.1 % — AB (ref 39.0–52.0)
HEMOGLOBIN: 9.7 g/dL — AB (ref 13.0–17.0)
MCH: 28.1 pg (ref 26.0–34.0)
MCHC: 32.2 g/dL (ref 30.0–36.0)
MCV: 87.2 fL (ref 78.0–100.0)
Platelets: 137 10*3/uL — ABNORMAL LOW (ref 150–400)
RBC: 3.45 MIL/uL — ABNORMAL LOW (ref 4.22–5.81)
RDW: 14.6 % (ref 11.5–15.5)
WBC: 9.6 10*3/uL (ref 4.0–10.5)

## 2018-01-31 LAB — GLUCOSE, CAPILLARY
Glucose-Capillary: 146 mg/dL — ABNORMAL HIGH (ref 70–99)
Glucose-Capillary: 226 mg/dL — ABNORMAL HIGH (ref 70–99)
Glucose-Capillary: 321 mg/dL — ABNORMAL HIGH (ref 70–99)

## 2018-01-31 LAB — MAGNESIUM: MAGNESIUM: 2.1 mg/dL (ref 1.7–2.4)

## 2018-01-31 LAB — PHOSPHORUS: Phosphorus: 8.5 mg/dL — ABNORMAL HIGH (ref 2.5–4.6)

## 2018-01-31 MED ORDER — LIDOCAINE HCL (PF) 1 % IJ SOLN: 5.0000 mL | INTRAMUSCULAR | Status: DC | PRN

## 2018-01-31 MED ORDER — SODIUM CHLORIDE 0.9 % IV SOLN: 100.0000 mL | INTRAVENOUS | Status: DC | PRN

## 2018-01-31 MED ORDER — LIDOCAINE-PRILOCAINE 2.5-2.5 % EX CREA: 1.0000 "application " | TOPICAL_CREAM | CUTANEOUS | Status: DC | PRN

## 2018-01-31 MED ORDER — HEPARIN SODIUM (PORCINE) 1000 UNIT/ML IJ SOLN
INTRAMUSCULAR | Status: AC
  Administered 2018-01-31: 2400 [IU]
  Filled 2018-01-31: qty 3

## 2018-01-31 MED ORDER — INSULIN GLARGINE 100 UNIT/ML ~~LOC~~ SOLN: 10.0000 [IU] | Freq: Every day | SUBCUTANEOUS | Status: DC

## 2018-01-31 MED ORDER — PENTAFLUOROPROP-TETRAFLUOROETH EX AERO: 1.0000 "application " | INHALATION_SPRAY | CUTANEOUS | Status: DC | PRN

## 2018-01-31 MED ORDER — ALTEPLASE 2 MG IJ SOLR: 2.0000 mg | Freq: Once | INTRAMUSCULAR | Status: DC | PRN

## 2018-01-31 MED ORDER — HEPARIN SODIUM (PORCINE) 1000 UNIT/ML DIALYSIS: 1000.0000 [IU] | INTRAMUSCULAR | Status: DC | PRN

## 2018-01-31 NOTE — Progress Notes (Addendum)
[facility] ROUNDING NOTE   Subjective:   Seen on dialysis appears to have some made some urine 275 cc of tea colored urine noted in Foley catheter bag  Blood pressure 140/72 pulse 98 temperature 97.9 O2 sats 100% 2.5 L nasal cannula.  Sodium 131 potassium 4.3 chloride 99 CO2 18 glucose 251 BUN 63 creatinine 8.34 calcium 6.4 WBC 9.6 hemoglobin 9.7 platelets 137 magnesium 2.1 phosphorus 8.5.  Chest x-ray revealed mild left basilar atelectasis   Objective:  Vital signs in last 24 hours:  Temp:  [97.9 F (36.6 C)-98.8 F (37.1 C)] 97.9 F (36.6 C) (09/27 0718) Pulse Rate:  [87-103] 98 (09/27 0900) Resp:  [15-25] 23 (09/27 0900) BP: (105-151)/(60-84) 143/72 (09/27 0900) SpO2:  [96 %-100 %] 100 % (09/27 0900) Weight:  [94.2 kg-96.8 kg] 94.2 kg (09/27 0446)  Weight change: -0.4 kg Filed Weights   01/30/18 1530 01/30/18 2105 01/31/18 0446  Weight: 94.2 kg 94.2 kg 94.2 kg    Intake/Output: I/O last 3 completed shifts: In: 1591.7 [I.V.:1249.7; IV Piggyback:342] Out: 4098 [Urine:425; Other:3000]   Intake/Output this shift:  No intake/output data recorded.  CVS- RRR systolic murmur RS-  decreased breath sounds at base ABD-distended abdomen EXT-generalized edema   Basic Metabolic Panel: Recent Labs  Lab 01/29/18 0346 01/29/18 1707 01/29/18 2000 01/30/18 0500 01/31/18 0350  NA 127* 130* 132* 132* 131*  K 4.9 5.1 4.3 4.4 4.3  CL 97* 99 101 100 99  CO2 12* 13* 16* 16* 18*  GLUCOSE 67* 65* 78 83 251*  BUN 94* 93* 74* 78* 63*  CREATININE 10.26* 10.14* 8.97* 9.38* 8.34*  CALCIUM 5.8* 6.3* 6.8* 6.7* 6.4*  MG  --   --   --   --  2.1  PHOS  --  10.1*  --   --  8.5*    Liver Function Tests: Recent Labs  Lab 01/27/18 1819 01/29/18 1707  AST 19  --   ALT <5  --   ALKPHOS 66  --   BILITOT 0.5  --   PROT 5.8*  --   ALBUMIN 2.2* 2.1*   No results for input(s): LIPASE, AMYLASE in the last 168 hours. No results for input(s): AMMONIA in the last 168  hours.  CBC: Recent Labs  Lab 01/28/18 0059 01/29/18 0346 01/29/18 1707 01/30/18 0604 01/31/18 0350  WBC 15.5* 13.2* 13.1* 11.0* 9.6  NEUTROABS  --  11.0*  --   --   --   HGB 12.6* 10.8* 11.6* 11.2* 9.7*  HCT 36.8* 32.8* 34.6* 34.6* 30.1*  MCV 84.2 86.1 85.4 86.5 87.2  PLT 146* 132* 157 173 137*    Cardiac Enzymes: Recent Labs  Lab 01/30/18 0500  CKTOTAL 33*    BNP: Invalid input(s): POCBNP  CBG: Recent Labs  Lab 01/30/18 0717 01/30/18 1144 01/30/18 1527 01/30/18 1856 01/30/18 2105  GLUCAP 89 97 121* 186* 88*    Microbiology: Results for orders placed or performed during the hospital encounter of 01/27/18  Urine culture     Status: None   Collection Time: 01/28/18  4:29 PM  Result Value Ref Range Status   Specimen Description URINE, CATHETERIZED  Final   Special Requests NONE  Final   Culture   Final    NO GROWTH Performed at [facility] [address], [place], [place] [address]    Report Status 01/29/2018 FINAL  Final    Coagulation Studies: No results for input(s): LABPROT, INR in the last 72 hours.  Urinalysis:  No results for input(s): COLORURINE, LABSPEC, [place], GLUCOSEU, HGBUR, BILIRUBINUR, KETONESUR, PROTEINUR, UROBILINOGEN, NITRITE, LEUKOCYTESUR in the last 72 hours.  Invalid input(s): APPERANCEUR    Imaging: Dg Chest Port 1 View  Result Date: 01/31/2018 CLINICAL DATA:  Acute respiratory failure EXAM: PORTABLE CHEST 1 VIEW COMPARISON:  01/29/2018 FINDINGS: Cardiac shadow is stable. Postoperative changes are seen. Right jugular central line is noted in satisfactory position. The right-sided PICC line is also noted in stable position. The lungs are well aerated with minimal left basilar atelectasis. No pneumothorax is seen. No bony abnormality is noted. IMPRESSION: Mild left basilar atelectasis. Tubes and lines as described. Electronically Signed   By: Inez Catalina M.D.   On: 01/31/2018 08:12   Dg Chest Port 1 View  Result Date:  01/29/2018 CLINICAL DATA:  Severe shortness of breath. EXAM: PORTABLE CHEST 1 VIEW COMPARISON:  01/27/2018. FINDINGS: Stable enlarged cardiac silhouette, post CABG changes and prosthetic aortic valve. Interval right jugular catheter with its tip in the superior vena cava. No pneumothorax. Stable right PICC with its tip in the inferior aspect of the superior vena cava. Increased prominence of the interstitial markings, including Kerley lines with an interval small left pleural effusion. Stable left lung postsurgical changes. No acute bony abnormality. IMPRESSION: Interval changes of congestive heart failure with stable cardiomegaly. Electronically Signed   By: Claudie Revering M.D.   On: 01/29/2018 16:24     Medications:   . sodium chloride    . sodium chloride    . DAPTOmycin (CUBICIN)  IV 700 mg (01/30/18 2224)   . Chlorhexidine Gluconate Cloth  6 each Topical Q0600  . fluconazole  50 mg Oral Daily  . Gerhardt's butt cream   Topical TID  . heparin      . heparin  5,000 Units Subcutaneous Q8H  . insulin aspart  0-5 Units Subcutaneous QHS  . insulin aspart  0-9 Units Subcutaneous TID WC  . latanoprost  1 drop Both Eyes QHS  . mouth rinse  15 mL Mouth Rinse BID  . metoprolol tartrate  50 mg Oral BID  . nystatin   Topical BID  . pantoprazole  40 mg Oral Q1200  . polyethylene glycol  17 g Oral Daily  . senna-docusate  1 tablet Oral BID   sodium chloride, sodium chloride, acetaminophen **OR** acetaminophen, alteplase, clonazepam, heparin, lidocaine (PF), lidocaine-prilocaine, ondansetron **OR** ondansetron (ZOFRAN) IV, pentafluoroprop-tetrafluoroeth, sodium chloride flush  Assessment/ Plan:     Acute renal failure Baseline renal function with normal range 1.09 01/17/2018.  Puzzling etiology is not appear to have any admission of nephrotoxic agents CT scan abdomen and pelvis reveals generalized anasarca.  Have discontinued IV fluids.  Have discontinued Ancef.  Urinalysis revealed a dirty appearing  sediment with red cells white cells and protein.  Appears to have improved urine output overnight.  We will continue to monitor for recovery of renal function.  This could be consistent with acute tubular necrosis versus acute interstitial nephritis from use of Ancef  Metabolic acidosis should correct with dialysis  Oral candidiasis Diflucan ordered  MSSA bacteremia patient started on daptomycin appreciate Dr. 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   LOS: [place] @TODAY @9 :19  AM

## 2018-01-31 NOTE — Clinical Social Work Note (Signed)
CSW reviewed notes and is continuing for monitor patient's progress. Mr. Bloodworth came to hospital from [facility]. CSW will continue to follow, provide SW intervention services as needed and facilitate discharge back to SNF when medically stable.  Barbie Croston Givens, MSW, LCSW Licensed Clinical Social Worker [place] [phone]

## 2018-01-31 NOTE — Progress Notes (Signed)
[facility]                                                                              Patient Demographics  [name], is a 62 y.o. male, DOB - 1955/11/14, TDV:[id number]  Admit date - 01/27/2018   Admitting Physician Vianne Bulls, MD  Outpatient Primary MD for the patient is [name], MD  Outpatient specialists:   LOS - 4  days   Medical records reviewed and are as summarized below:    Chief Complaint  Patient presents with  . 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  He reports an uneventful course at the nursing home until his urine output dropped off over the past couple days, noting that he has urinated very little in the past 2 to 3 days.  He has developed some mild suprapubic discomfort, but denies dysuria or flank pain and denies any fevers or chills.  He was evaluated with basic blood work at the nursing facility and found to have creatinine markedly elevated.  Creatinine 9.6 at the time of admission, 1.1 on 9/18 Patient was transferred to ICU on 9/25 due to respiratory distress, not responding to Lasix.  He was placed on BiPAP and HD cath was placed by CCM, hemodialysis started emergently. Patient transferred to the floor, [facility] hospitalist assumed care back on 9/27  Assessment & Plan    Principal Problem: Acute kidney injury: Presenting with oliguria to anuria in the last 2 to 3 days prior to admission -Baseline creatinine 1.1, unclear etiology, renal ultrasound did  not reveal any obstruction. Possible acute interstitial nephritis, medications, ?  Ancef -Patient was initially placed on IV fluid hydration however discontinued after developing shortness of breath.  He was placed on high-dose Lasix with no significant improvement.  Patient was transferred to ICU on 9/25 due to respiratory distress not responding to Lasix. -Temporary HD cath was placed by CCM, patient started on hemodialysis.  Active Problems: Acute respiratory failure with hypoxia -Likely due to volume overload/pulmonary edema from ESRD -Off BiPAP now, started on urgent hemodialysis on 9/25.  Patient receiving back-to-back HD, tolerating well.  Previous MSSA bacteremia with splenic infarct, tricuspid valve endocarditis -ID following, Ancef discontinued.   -Patient started on IV daptomycin.   Oral thrush, diffuse erythema in the intertriginous areas -Started on Diflucan per pharmacy    Insulin-requiring or dependent type II diabetes mellitus ([place]), uncontrolled with hypoglycemia -Hemoglobin A1c 8.5 -Patient has been hypoglycemic this admission, hence continued on sliding scale insulin. -Prior to admission, patient was on Trulicity, glipizide, sliding scale insulin, Toujeo 30 units daily -With starting hemodialysis, will need to decrease insulin upon dc.     Essential  hypertension,  CAD (coronary artery disease) -Change aspirin, beta-blocker, holding statin due to daptomycin -Continue to hold ACE inhibitor     S/P BKA (below knee amputation) unilateral, right (HCC) -Appreciate orthopedics following, Dr. Sharol Given, currently no cellulitis or drainage or wound dehiscence -Recommended removal of staples in 1 to 2 weeks, follow-up in office     Hyponatremia -Improving   Code Status: Full CODE STATUS DVT Prophylaxis: Heparin subcu Family Communication: Discussed in detail with the patient, all imaging results, lab results explained to the patient    Disposition Plan: Not medically  ready, if hemodialysis long-term, will need vascular access and clip  Time Spent in minutes  15mns   Procedures:  BiPAP Hemodialysis  Consultants:   Infectious disease Nephrology CCM  Antimicrobials:  Daptomycin 9/25>>  Medications  Scheduled Meds: . Chlorhexidine Gluconate Cloth  6 each Topical Q0600  . fluconazole  50 mg Oral Daily  . Gerhardt's butt cream   Topical TID  . heparin      . heparin  5,000 Units Subcutaneous Q8H  . insulin aspart  0-5 Units Subcutaneous QHS  . insulin aspart  0-9 Units Subcutaneous TID WC  . latanoprost  1 drop Both Eyes QHS  . mouth rinse  15 mL Mouth Rinse BID  . metoprolol tartrate  50 mg Oral BID  . nystatin   Topical BID  . pantoprazole  40 mg Oral Q1200  . polyethylene glycol  17 g Oral Daily  . senna-docusate  1 tablet Oral BID   Continuous Infusions: . sodium chloride    . sodium chloride    . DAPTOmycin (CUBICIN)  IV 700 mg (01/30/18 2224)   PRN Meds:.sodium chloride, sodium chloride, acetaminophen **OR** acetaminophen, alteplase, clonazepam, heparin, lidocaine (PF), lidocaine-prilocaine, ondansetron **OR** ondansetron (ZOFRAN) IV, pentafluoroprop-tetrafluoroeth, sodium chloride flush   Antibiotics   Anti-infectives (From admission, onward)   Start     Dose/Rate Route Frequency Ordered Stop   01/30/18 2000  DAPTOmycin (CUBICIN) 700 mg in sodium chloride 0.9 % IVPB     700 mg 228 mL/hr over 30 Minutes Intravenous Every 48 hours 01/30/18 1306     01/30/18 1000  fluconazole (DIFLUCAN) tablet 50 mg     50 mg Oral Daily 01/29/18 0930 02/05/18 0959   01/29/18 2000  DAPTOmycin (CUBICIN) 700 mg in sodium chloride 0.9 % IVPB  Status:  Discontinued     700 mg 228 mL/hr over 30 Minutes Intravenous Every 48 hours 01/29/18 1013 01/30/18 1306   01/29/18 1030  fluconazole (DIFLUCAN) tablet 100 mg     100 mg Oral  Once 01/29/18 0930 01/29/18 1121   01/28/18 2200  ceFAZolin (ANCEF) IVPB 1 g/50 mL premix  Status:  Discontinued     1  g 100 mL/hr over 30 Minutes Intravenous Every 24 hours 01/28/18 1007 01/28/18 1231   01/27/18 2245  ceFAZolin (ANCEF) IVPB 2g/100 mL premix  Status:  Discontinued     2 g 200 mL/hr over 30 Minutes Intravenous Every 12 hours 01/27/18 2243 01/28/18 1007   01/27/18 2230  ceFAZolin (ANCEF) IVPB  Status:  Discontinued    Note to Pharmacy:  Indication:  MSSA bacteremia/endocarditis Last Day of Therapy:  03/08/18 Labs - Once weekly:  CBC/D and BMP, Labs - Every other week:  ESR and CRP     2 g Intravenous Every 8 hours 01/27/18 2220 01/27/18 2227   01/27/18 2230  ceFAZolin (ANCEF) IVPB 2g/100 mL premix  Status:  Discontinued     2 g 200 mL/hr  over 30 Minutes Intravenous Every 8 hours 01/27/18 2227 01/27/18 2243        Subjective:   Kippy Simons was seen and examined today.  Seen during hemodialysis, tolerating well, no acute complaints.  No fevers or chills.  Patient denies dizziness, abdominal pain, N/V/D/C, new weakness, numbess, tingling.   Objective:   Vitals:   01/31/18 0830 01/31/18 0900 01/31/18 0930 01/31/18 1000  BP: 118/60 (!) 143/72 125/70 115/66  Pulse: 95 98 98 99  Resp: 20 (!) 23 (!) 21 19  Temp:      TempSrc:      SpO2: 98% 100% 100% 99%  Weight:      Height:        Intake/Output Summary (Last 24 hours) at 01/31/2018 1030 Last data filed at 01/31/2018 0601 Gross per 24 hour  Intake 1125.04 ml  Output 3425 ml  Net -2299.96 ml     Wt Readings from Last 3 Encounters:  01/31/18 94.2 kg  01/23/18 95.2 kg  01/16/18 95.2 kg     Exam   General: Alert and oriented x 3, NAD  Eyes:   HEENT: dressing intact on IJ temporary HD cath  Cardiovascular: S1 S2 auscultated, RRR, systolic murmur   Respiratory: Clear to auscultation bilaterally, no wheezing, rales or rhonchi  Gastrointestinal: Soft, nontender, nondistended, + bowel sounds  Ext: bilateral BKA  Neuro: no new deficits  Musculoskeletal: No digital cyanosis, clubbing  Skin: No rashes  Psych:  Normal affect and demeanor, alert and oriented x3     Data Reviewed:  I have personally reviewed following labs and imaging studies  Micro Results Recent Results (from the past 240 hour(s))  Urine culture     Status: None   Collection Time: 01/28/18  4:29 PM  Result Value Ref Range Status   Specimen Description URINE, CATHETERIZED  Final   Special Requests NONE  Final   Culture   Final    NO GROWTH Performed at [facility], [address], [place], [place] [address]    Report Status 01/29/2018 FINAL  Final    Radiology Reports Dg Abd 1 View  Result Date: 01/28/2018 CLINICAL DATA:  Abdominal pain EXAM: ABDOMEN - 1 VIEW COMPARISON:  CT abdomen pelvis of 01/14/2018 FINDINGS: A supine film of the abdomen shows the lumbar spine to be somewhat rotated to the right. No bowel obstruction is seen. However there is paucity of bowel gas in the left abdomen of doubtful significance in view of the recent CT scan showing no mass. Calcified vas deferens are noted in the pelvis. IMPRESSION: 1. No bowel obstruction. 2. Paucity of bowel gas in the left abdomen of doubtful significance. 3. Calcified vas deferens. Electronically Signed   By: Ivar Drape M.D.   On: 01/28/2018 11:24   Ct Head Wo Contrast  Result Date: 01/14/2018 CLINICAL DATA:  Unexplained altered level consciousness. Slurred speech. Nausea. Frontal headache. Nausea and vomiting. EXAM: CT HEAD WITHOUT CONTRAST TECHNIQUE: Contiguous axial images were obtained from the base of the skull through the vertex without intravenous contrast. COMPARISON:  07/27/2014 FINDINGS: Brain: No evidence of acute infarction, hemorrhage, hydrocephalus, extra-axial collection, or mass lesion/mass effect. Mild generalized cerebral atrophy, without significant change. Vascular:  No hyperdense vessel or other acute findings. Skull: No evidence of fracture or other significant bone abnormality. Sinuses/Orbits:  No acute findings. Other: None. IMPRESSION: No acute  intracranial abnormality.  Mild cerebral atrophy. Electronically Signed   By: Earle Gell M.D.   On: 01/14/2018 20:05  Ct Abdomen Pelvis W Contrast  Result Date: 01/14/2018 CLINICAL DATA:  Acute abdominal pain. EXAM: CT ABDOMEN AND PELVIS WITH CONTRAST TECHNIQUE: Multidetector CT imaging of the abdomen and pelvis was performed using the standard protocol following bolus administration of intravenous contrast. CONTRAST:  137m ISOVUE-300 IOPAMIDOL (ISOVUE-300) INJECTION 61% COMPARISON:  None. FINDINGS: Lower chest: Motion artifact through the lung bases. Mild dependent atelectasis on the left. Heart appears prominent size. There are coronary artery calcifications. Hepatobiliary: Decreased hepatic density consistent with steatosis. No discrete focal lesion. Hyperdensity at the gallbladder neck may be stones or sludge. No pericholecystic inflammation. No biliary dilatation. Pancreas: Fatty atrophy.  No ductal dilatation or inflammation. Spleen: Normal in size. Peripheral slightly wedge-shaped low-density in the posterior spleen measures approximately 4 cm in depth. No perisplenic fluid. Adrenals/Urinary Tract: Mild thickening left adrenal gland without dominant nodule. Normal right adrenal gland. No hydronephrosis. Symmetric bilateral perinephric edema. Homogeneous renal enhancement. Possible small exophytic cyst from the mid left kidney versus perinephric edema. Urinary bladder is physiologically distended. No bladder wall thickening. Stomach/Bowel: Small hiatal hernia. Motion artifact and lack of enteric contrast limits detailed bowel assessment. Allowing for this, no evidence of obstruction, inflammation or bowel wall thickening. Normal appendix. Vascular/Lymphatic: Moderate aorto bi-iliac atherosclerosis without aneurysm. Portal vein and mesenteric vessels are patent. No enlarged abdominal or pelvic lymph nodes. Reproductive: Prostate is unremarkable. Other: No free air or free fluid. Musculoskeletal:  Scoliosis and degenerative change throughout the lumbar spine. Slight flattening of the left femoral head may be chronic. No acute osseous abnormality, particularly, no left rib fractures. IMPRESSION: 1. 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 No perisplenic fluid. 2. Symmetric bilateral perinephric edema, likely chronic but can be seen in the setting of urinary tract infection. 3. Gallstones or sludge without gallbladder inflammation on CT. 4. Incidental hepatic steatosis and Aortic Atherosclerosis (ICD10-I70.0). Electronically Signed   By: MKeith RakeM.D.   On: 01/14/2018 22:19   [place]oreaRenal  Result Date: 01/27/2018 CLINICAL DATA:  62year old male with acute renal failure. EXAM: RENAL / URINARY TRACT ULTRASOUND COMPLETE COMPARISON:  CT of abdomen pelvis dated 01/14/2018 FINDINGS: Right Kidney: Length: 12.2 cm. Normal echogenicity. No hydronephrosis or shadowing stone. Left Kidney: Length: 11.7 cm. Normal echogenicity. No hydronephrosis or shadowing stone. Bladder: There is layering debris in the urinary bladder. Correlation with urinalysis recommended. The liver is slightly echogenic likely fatty infiltration. There is probable Riedel's lobe anatomy. IMPRESSION: 1. No hydronephrosis or shadowing stone. 2. Layering debris within the urinary bladder. Correlation with urinalysis recommended. Electronically Signed   By: AAnner CreteM.D.   On: 01/27/2018 22:38   Mr Foot Right W Wo Contrast  Result Date: 01/16/2018 CLINICAL DATA:  Necrotic gangrenous ulcer on the lateral aspect of the right foot with ascending cellulitis. EXAM: MRI OF THE RIGHT FOREFOOT WITHOUT AND WITH CONTRAST TECHNIQUE: Multiplanar, multisequence MR imaging of the right foot was performed before and after the administration of intravenous contrast. CONTRAST:  10 cc Gadavist COMPARISON:  Radiographs 01/15/2018 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 metatarsal  level. I do not see any marrow signal abnormality or enhancement to suggest osteomyelitis. No findings for septic arthritis. No subcutaneous soft tissue swelling/edema/fluid surrounding the foot suggesting cellulitis but no discrete drainable soft tissue abscess or findings for pyomyositis. IMPRESSION: 1. Changes suggesting cellulitis but no discrete drainable soft tissue abscess. 2. No MR findings for septic arthritis, osteomyelitis or pyomyositis. Electronically Signed   By: PRicky StabsD.  On: 01/16/2018 10:53   Dg Chest Port 1 View  Result Date: 01/31/2018 CLINICAL DATA:  Acute respiratory failure EXAM: PORTABLE CHEST 1 VIEW COMPARISON:  01/29/2018 FINDINGS: Cardiac shadow is stable. Postoperative changes are seen. Right jugular central line is noted in satisfactory position. The right-sided PICC line is also noted in stable position. The lungs are well aerated with minimal left basilar atelectasis. No pneumothorax is seen. No bony abnormality is noted. IMPRESSION: Mild left basilar atelectasis. Tubes and lines as described. Electronically Signed   By: Inez Catalina M.D.   On: 01/31/2018 08:12   Dg Chest Port 1 View  Result Date: 01/29/2018 CLINICAL DATA:  Severe shortness of breath. EXAM: PORTABLE CHEST 1 VIEW COMPARISON:  01/27/2018. FINDINGS: Stable enlarged cardiac silhouette, post CABG changes and prosthetic aortic valve. Interval right jugular catheter with its tip in the superior vena cava. No pneumothorax. Stable right PICC with its tip in the inferior aspect of the superior vena cava. Increased prominence of the interstitial markings, including Kerley lines with an interval small left pleural effusion. Stable left lung postsurgical changes. No acute bony abnormality. IMPRESSION: Interval changes of congestive heart failure with stable cardiomegaly. Electronically Signed   By: Claudie Revering M.D.   On: 01/29/2018 16:24   Dg Chest Portable 1 View  Result Date: 01/27/2018 CLINICAL DATA:   Intermittent LEFT chest pain.  Oliguric. EXAM: PORTABLE CHEST 1 VIEW COMPARISON:  Chest radiograph January 15, 2018 FINDINGS: RIGHT PICC distal tip projects in distal superior vena cava. No pneumothorax. Stable mild cardiomegaly. Status post aortic valve replacement. LEFT mid lung zone bandlike density with mildly elevated LEFT hemidiaphragm. No focal consolidation. Blunting of the LEFT costophrenic angle. No pneumothorax. Soft tissue planes and included osseous structures are non suspicious. Surgical clips bilateral axilla. IMPRESSION: 1. RIGHT PICC distal tip projecting in distal superior vena cava. No pneumothorax. 2. LEFT lung base atelectasis with small pleural effusion versus pleural thickening. 3. Stable cardiomegaly. Electronically Signed   By: Elon Alas M.D.   On: 01/27/2018 18:43   Dg Chest Port 1 View  Result Date: 01/15/2018 CLINICAL DATA:  Severe sepsis. EXAM: PORTABLE CHEST 1 VIEW COMPARISON:  Radiograph 12/08/2014 FINDINGS: Stable cardiomegaly. Post CABG with prosthetic valve. No sternal wires visualized. No pulmonary edema. Scattered atelectasis. No confluent airspace disease, pleural effusion or pneumothorax. No acute osseous abnormalities. IMPRESSION: Chronic cardiomegaly with scattered atelectasis. Electronically Signed   By: Keith Rake M.D.   On: 01/15/2018 00:20   Dg Foot Complete Right  Result Date: 01/15/2018 CLINICAL DATA:  RIGHT foot infection, sepsis, prior amputation EXAM: RIGHT FOOT COMPLETE - 3+ VIEW COMPARISON:  04/06/2009 FINDINGS: Osseous demineralization. Prior transmetatarsal amputation. Joint spaces preserved. Mild dorsal spur formation at the talonavicular joint. No acute fracture, dislocation or bone destruction. Motion artifacts degrade lateral view, no gross osseous abnormalities identified. Scattered soft tissue swelling without soft tissue gas. IMPRESSION: Transmetatarsal amputation RIGHT foot. No acute osseous findings. Electronically Signed   By:  Lavonia Dana M.D.   On: 01/15/2018 00:57   Ct Renal Stone Study  Result Date: 01/29/2018 CLINICAL DATA:  anuria EXAM: CT ABDOMEN AND PELVIS WITHOUT CONTRAST TECHNIQUE: Multidetector CT imaging of the abdomen and pelvis was performed following the standard protocol without oral or IV contrast. COMPARISON:  January 14, 2018 FINDINGS: Lower chest: There are pleural effusions bilaterally with consolidation in the posterior lung bases. There are foci of coronary artery calcification. There is a small hiatal hernia. Hepatobiliary: There is hepatic steatosis. No focal liver lesions  are appreciable. Gallbladder is mildly distended. There are gallstones present. There may also be sludge in the gallbladder. There is no appreciable gallbladder wall thickening. There is no pericholecystic fluid. Pancreas: There is fatty infiltration in the pancreas. No focal pancreatic lesions are evident. Spleen: There is decreased attenuation in the posterior spleen in a wedge-shaped configuration consistent with splenic infarct, felt to be stable from most recent study. No new splenic lesions are evident on this noncontrast enhanced study. Adrenals/Urinary Tract: Adrenals bilaterally appear normal. There is no evident renal mass or hydronephrosis on either side. There is no appreciable renal or ureteral calculus on either side. There is a Foley catheter within the urinary bladder. There is wall thickening in portions of the urinary bladder anteriorly. Air within the urinary bladder may well be iatrogenic given recent catheterization. Stomach/Bowel: There is no appreciable bowel wall or mesenteric thickening. No evident bowel obstruction. There is no appreciable free air or portal venous air. Vascular/Lymphatic: There is aortic atherosclerosis. There is also atherosclerotic calcification in the common iliac arteries. No aneurysm evident. Major mesenteric arterial vessels are felt to be patent, although there are foci of atherosclerotic  calcification in mesenteric vessels. There is no appreciable adenopathy in the abdomen or pelvis. Reproductive: Prostate and seminal vesicles are normal in size and contour. There is calcification in the seminal vesicles. No pelvic masses evident. Other: There is ascites in the abdomen and pelvis with areas of apparent loculation of ascites in the pelvis. No abscess is evident in the abdomen or pelvis. Appendix appears unremarkable. There is mesenteric thickening in the left lower abdomen, near in area of loculated ascites. There is generalized anasarca. Musculoskeletal: Degenerative change throughout the lower thoracic and lumbar regions remain stable. There is loss of cortex with soft tissue thickening along the anterior, inferior aspect of T12 with loss of disc space in this area. There is also lucency along the posterior aspect of the L1 vertebral body, stable. A degree of discitis/osteomyelitis in this area must be of concern. No intramuscular lesions are evident. IMPRESSION: 1. Loss of cortex along the anterior inferior aspect of the T12 vertebral body as well as lucency in the posterior aspect of the L1 vertebral body. Question a degree of discitis/osteomyelitis in this region. This finding may warrant MR pre and post-contrast to further evaluate with respect to discitis/bony destruction. 2. Generalized anasarca. Areas of partially loculated ascites as well as mild mesenteric thickening in the left lower quadrant. No evident abscess in the abdomen or pelvis. 3. Cholelithiasis with probable sludge in the gallbladder as well. Gallbladder mildly distended. Gallbladder wall not thickened. 4. No evident hydronephrosis on either side. No renal or ureteral calculus on either side. 5. Urinary bladder decompressed. Air within the urinary bladder likely is due to recent catheterization. Correlation with urinalysis to assess for possible gas-forming organism is a cause for air within the bladder is advised. 6.  No bowel  obstruction.  Appendix region appears normal. 7.  Small hiatal hernia. 8.  Aortoiliac atherosclerosis.  Coronary artery calcification. 9.  Bilateral pleural effusions with bibasilar consolidation. 10. 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 Aortic Atherosclerosis (ICD10-I70.0). Electronically Signed   By: Lowella Grip III M.D.   On: 01/29/2018 07:08   [place] Ekg Site Rite  Result Date: 01/21/2018 If Site Rite image not attached, placement could not be confirmed due to current cardiac rhythm.   Lab Data:  CBC: Recent Labs  Lab 01/28/18 0059 01/29/18 0346 01/29/18 1707 01/30/18 0604 01/31/18  0350  WBC 15.5* 13.2* 13.1* 11.0* 9.6  NEUTROABS  --  11.0*  --   --   --   HGB 12.6* 10.8* 11.6* 11.2* 9.7*  HCT 36.8* 32.8* 34.6* 34.6* 30.1*  MCV 84.2 86.1 85.4 86.5 87.2  PLT 146* 132* 157 173 992*   Basic Metabolic Panel: Recent Labs  Lab 01/29/18 0346 01/29/18 1707 01/29/18 2000 01/30/18 0500 01/31/18 0350  NA 127* 130* 132* 132* 131*  K 4.9 5.1 4.3 4.4 4.3  CL 97* 99 101 100 99  CO2 12* 13* 16* 16* 18*  GLUCOSE 67* 65* 78 83 251*  BUN 94* 93* 74* 78* 63*  CREATININE 10.26* 10.14* 8.97* 9.38* 8.34*  CALCIUM 5.8* 6.3* 6.8* 6.7* 6.4*  MG  --   --   --   --  2.1  PHOS  --  10.1*  --   --  8.5*   GFR: Estimated Creatinine Clearance: 10.7 mL/min (A) (by C-G formula based on SCr of 8.34 mg/dL (H)). Liver Function Tests: Recent Labs  Lab 01/27/18 1819 01/29/18 1707  AST 19  --   ALT <5  --   ALKPHOS 66  --   BILITOT 0.5  --   PROT 5.8*  --   ALBUMIN 2.2* 2.1*   No results for input(s): LIPASE, AMYLASE in the last 168 hours. No results for input(s): AMMONIA in the last 168 hours. Coagulation Profile: No results for input(s): INR, PROTIME in the last 168 hours. Cardiac Enzymes: Recent Labs  Lab 01/30/18 0500  CKTOTAL 33*   BNP (last 3 results) No results for input(s): PROBNP in the last 8760 hours. HbA1C: No results for input(s): HGBA1C  in the last 72 hours. CBG: Recent Labs  Lab 01/30/18 0717 01/30/18 1144 01/30/18 1527 01/30/18 1856 01/30/18 2105  GLUCAP 89 97 121* 186* 228*   Lipid Profile: No results for input(s): CHOL, HDL, LDLCALC, TRIG, CHOLHDL, LDLDIRECT in the last 72 hours. Thyroid Function Tests: No results for input(s): TSH, T4TOTAL, FREET4, T3FREE, THYROIDAB in the last 72 hours. Anemia Panel: No results for input(s): VITAMINB12, FOLATE, FERRITIN, TIBC, IRON, RETICCTPCT in the last 72 hours. Urine analysis:    Component Value Date/Time   COLORURINE AMBER (A) 01/27/2018 1034   APPEARANCEUR TURBID (A) 01/27/2018 1034   LABSPEC 1.017 01/27/2018 1034   PHURINE 6.0 01/27/2018 1034   GLUCOSEU NEGATIVE 01/27/2018 1034   HGBUR MODERATE (A) 01/27/2018 1034   BILIRUBINUR NEGATIVE 01/27/2018 1034   KETONESUR NEGATIVE 01/27/2018 1034   PROTEINUR >=300 (A) 01/27/2018 1034   UROBILINOGEN 0.2 07/28/2014 0433   NITRITE NEGATIVE 01/27/2018 1034   LEUKOCYTESUR MODERATE (A) 01/27/2018 1034     Carr Shartzer M.D. [facility] 01/31/2018, 10:30 AM  Pager: [phone] Between 7am to 7pm - call Pager - [phone]  After 7pm go to [email or web address] - password [email or web address]  Call night coverage person covering after 7pm

## 2018-01-31 NOTE — Progress Notes (Signed)
[facility]  Date of Admission:  01/27/2018     Total days of antibiotics 18 (Day 2 daptomycin / 3 Fluconazole)         ASSESSMENT/PLAN  Mr. 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 Differentials include ATN or interstitual nephritis scondary to Ancef. Nephrology continuing with dialysis for now. Urinary output appears to be improving. Remains on Daptomycin and currently on Day 18 of therapy for endocarditis of 6 weeks. CK stable. No significant oral lesions noted on exam.   1. Continue current daptomycin and monitor CK levels.  2. Continue fluconazole for potential thrush.  3. Electrolyte management per primary team and nephrology.    Principal Problem:   Acute renal failure (ARF) (HCC) Active Problems:   Insulin-requiring or dependent type II diabetes mellitus ([place])   Essential hypertension   CAD (coronary artery disease)   Leukocytosis   S/P BKA (below knee amputation) unilateral, right ([place])   Endocarditis of tricuspid valve   Hypoglycemia due to insulin   Hyponatremia   Respiratory distress   . Chlorhexidine Gluconate Cloth  6 each Topical Q0600  . fluconazole  50 mg Oral Daily  . Gerhardt's butt cream   Topical TID  . heparin  5,000 Units Subcutaneous Q8H  . insulin aspart  0-5 Units Subcutaneous QHS  . insulin aspart  0-9 Units Subcutaneous TID WC  . latanoprost  1 drop Both Eyes QHS  . mouth rinse  15 mL Mouth Rinse BID  . metoprolol tartrate  50 mg Oral BID  . nystatin   Topical BID  . pantoprazole  40 mg Oral Q1200  . polyethylene glycol  17 g Oral Daily  . senna-docusate  1 tablet Oral BID    SUBJECTIVE:  Afebrile overnight. WBC count in the normal range. Creatinine of 8.34 which appears to be stable. Hypocalcemia with 6.4. Most recent CK 33. Completed dialysis today. Urinary function appears to be improving slowly.   Doing well and  feeling better. Initially describes having difficulty with eating and has noted improvements. Denies fevers, chills or sweats.     Allergies  Allergen Reactions  . Ace Inhibitors Cough    Review of Systems: Review of Systems  Constitutional: Negative for chills, fever and malaise/fatigue.  Respiratory: Negative for cough, sputum production, shortness of breath and wheezing.   Cardiovascular: Negative for chest pain and leg swelling.  Gastrointestinal: Negative for abdominal pain, constipation, diarrhea, nausea and vomiting.  Skin: Negative for rash.    OBJECTIVE: Vitals:   01/31/18 0930 01/31/18 1000 01/31/18 1029 01/31/18 1050  BP: 125/70 115/66 127/69 (!) 142/64  Pulse: 98 99 98 (!) 103  Resp: (!) 21 19 18 18   Temp:   97.9 F (36.6 C) 97.8 F (36.6 C)  TempSrc:   Oral Oral  SpO2: 100% 99% 99% 100%  Weight:      Height:       Body mass index is 29.8 kg/m.  Physical Exam  Constitutional: He is oriented to person, place, and time. He appears well-developed and well-nourished. No distress.  Lying in bed with head elevated. Wrap noted around dialysis catheter. Pleasant.   Cardiovascular: Normal rate, regular rhythm, normal heart sounds and intact distal pulses.  PICC line appears clean and dry with no evidence of infection.   Pulmonary/Chest: Effort normal and breath sounds normal.  Neurological: He is alert and oriented to person, place, and  time.  Skin: Skin is warm and dry.  Psychiatric: He has a normal mood and affect.    Lab Results Lab Results  Component Value Date   WBC 9.6 01/31/2018   HGB 9.7 (L) 01/31/2018   HCT 30.1 (L) 01/31/2018   MCV 87.2 01/31/2018   PLT 137 (L) 01/31/2018    Lab Results  Component Value Date   CREATININE 8.34 (H) 01/31/2018   BUN 63 (H) 01/31/2018   NA 131 (L) 01/31/2018   K 4.3 01/31/2018   CL 99 01/31/2018   CO2 18 (L) 01/31/2018    Lab Results  Component Value Date   ALT <5 01/27/2018   AST 19 01/27/2018   ALKPHOS 66  01/27/2018   BILITOT 0.5 01/27/2018     Microbiology: Recent Results (from the past 240 hour(s))  Urine culture     Status: None   Collection Time: 01/28/18  4:29 PM  Result Value Ref Range Status   Specimen Description URINE, CATHETERIZED  Final   Special Requests NONE  Final   Culture   Final    NO GROWTH Performed at [facility], [facility] [address], [place], [place] [address]    Report Status 01/29/2018 FINAL  Final     Terri Piedra, NP [facility] [phone] Pager  01/31/2018  11:50 AM

## 2018-01-31 NOTE — Progress Notes (Signed)
CRITICAL VALUE ALERT  Critical Value:  Calcium-6.4  Date & Time Notied:  01/31/2018  0507  Provider Notified: Silas [place]   Orders Received/Actions taken: No new orders at this time. Will     continue to monitor.

## 2018-02-01 LAB — GLUCOSE, CAPILLARY
Glucose-Capillary: 257 mg/dL — ABNORMAL HIGH (ref 70–99)
Glucose-Capillary: 284 mg/dL — ABNORMAL HIGH (ref 70–99)
Glucose-Capillary: 319 mg/dL — ABNORMAL HIGH (ref 70–99)
Glucose-Capillary: 313 mg/dL — ABNORMAL HIGH (ref 70–99)

## 2018-02-01 LAB — RENAL FUNCTION PANEL
ALBUMIN: 1.9 g/dL — AB (ref 3.5–5.0)
ANION GAP: 13 (ref 5–15)
BUN: 48 mg/dL — ABNORMAL HIGH (ref 8–23)
CALCIUM: 7.4 mg/dL — AB (ref 8.9–10.3)
CO2: 22 mmol/L (ref 22–32)
Chloride: 97 mmol/L — ABNORMAL LOW (ref 98–111)
Creatinine, Ser: 6.91 mg/dL — ABNORMAL HIGH (ref 0.61–1.24)
GFR calc Af Amer: 9 mL/min — ABNORMAL LOW (ref >60)
GFR, EST NON AFRICAN AMERICAN: 8 mL/min — AB (ref >60)
GLUCOSE: 263 mg/dL — AB (ref 70–99)
PHOSPHORUS: 6.7 mg/dL — AB (ref 2.5–4.6)
Potassium: 3.9 mmol/L (ref 3.5–5.1)
SODIUM: 132 mmol/L — AB (ref 135–145)

## 2018-02-01 LAB — CBC
HCT: 28.6 % — ABNORMAL LOW (ref 39.0–52.0)
HEMOGLOBIN: 9.1 g/dL — AB (ref 13.0–17.0)
MCH: 28.3 pg (ref 26.0–34.0)
MCHC: 31.8 g/dL (ref 30.0–36.0)
MCV: 89.1 fL (ref 78.0–100.0)
Platelets: 149 10*3/uL — ABNORMAL LOW (ref 150–400)
RBC: 3.21 MIL/uL — AB (ref 4.22–5.81)
RDW: 14.6 % (ref 11.5–15.5)
WBC: 9.3 10*3/uL (ref 4.0–10.5)

## 2018-02-01 MED ORDER — INSULIN ASPART 100 UNIT/ML ~~LOC~~ SOLN
3.0000 [IU] | Freq: Three times a day (TID) | SUBCUTANEOUS | Status: DC
  Administered 2018-02-01 – 2018-02-02 (×2): 3 [IU] via SUBCUTANEOUS

## 2018-02-01 MED ORDER — INSULIN GLARGINE 100 UNIT/ML ~~LOC~~ SOLN
10.0000 [IU] | Freq: Every day | SUBCUTANEOUS | Status: DC
  Administered 2018-02-01: 10 [IU] via SUBCUTANEOUS
  Filled 2018-02-01: qty 0.1

## 2018-02-01 NOTE — Progress Notes (Signed)
[facility]                                                                              Patient Demographics  [name], is a 62 y.o. male, DOB - 06-24-1955, LGX:[id number]  Admit date - 01/27/2018   Admitting Physician Vianne Bulls, MD  Outpatient Primary MD for the patient is [name], MD  Outpatient specialists:   LOS - 5  days   Medical records reviewed and are as summarized below:    Chief Complaint  Patient presents with  . 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  He reports an uneventful course at the nursing home until his urine output dropped off over the past couple days, noting that he has urinated very little in the past 2 to 3 days.  He has developed some mild suprapubic discomfort, but denies dysuria or flank pain and denies any fevers or chills.  He was evaluated with basic blood work at the nursing facility and found to have creatinine markedly elevated.  Creatinine 9.6 at the time of admission, 1.1 on 9/18 Patient was transferred to ICU on 9/25 due to respiratory distress, not responding to Lasix.  He was placed on BiPAP and HD cath was placed by CCM, hemodialysis started emergently. Patient transferred to the floor, [facility] hospitalist assumed care back on 9/27  Assessment & Plan    Principal Problem: Acute kidney injury: Presenting with oliguria to anuria in the last 2 to 3 days prior to admission -Baseline creatinine 1.1, unclear etiology, renal ultrasound did  not reveal any obstruction. Possible acute interstitial nephritis, medications, ?  Ancef -Patient was initially placed on IV fluid hydration however discontinued after developing shortness of breath.  He was placed on high-dose Lasix with no significant improvement.  Patient was transferred to ICU on 9/25 due to respiratory distress not responding to Lasix. -Temporary HD cath was placed by CCM, patient started on hemodialysis. -Urology following, patient has received back-to-back dialysis, feeling comfortable.  Creatinine improving.  Active Problems: Acute respiratory failure with hypoxia -Likely due to volume overload/pulmonary edema from ESRD -Off BiPAP now, started on urgent hemodialysis on 9/25.  Patient receiving back-to-back HD, tolerating well. -O2 sats 100% on 2 L  Previous MSSA bacteremia with splenic infarct, tricuspid valve endocarditis -ID following, Ancef discontinued.   -Patient started on IV daptomycin.  Per ID, continue daptomycin 24 more days from 9/27  Oral thrush, diffuse erythema in the intertriginous areas -Continue Diflucan for 7 days    Insulin-requiring or dependent type II diabetes mellitus ([place]), uncontrolled with hypoglycemia -Hemoglobin A1c 8.5 -Patient has been hypoglycemic this admission, hence continued on sliding scale insulin. -Prior to admission,  patient was on Trulicity, glipizide, sliding scale insulin, Toujeo 30 units daily -With starting hemodialysis, will need to decrease insulin upon dc. -CBGs elevated, in 200s, started on Lantus 10 units at bedtime, NovoLog meal coverage 3 units 3 times daily AC     Essential hypertension,  CAD (coronary artery disease) -Change aspirin, beta-blocker, holding statin due to daptomycin -Continue to hold ACE inhibitor     S/P BKA (below knee amputation) unilateral, right (HCC) -Appreciate orthopedics following, Dr. Sharol Given, currently no cellulitis or drainage or wound dehiscence -Recommended removal of staples in 1  to 2 weeks, follow-up in office     Hyponatremia -Improving   Code Status: Full CODE STATUS DVT Prophylaxis: Heparin subcu Family Communication: Discussed in detail with the patient, all imaging results, lab results explained to the patient    Disposition Plan: Patient needs to remain inpatient, monitoring creatinine function closely.  If needs hemodialysis long-term, will need vascular access and outpatient dialysis slot.  Time Spent in minutes 25   Procedures:  BiPAP Hemodialysis  Consultants:   Infectious disease Nephrology CCM  Antimicrobials:  Daptomycin 9/25>>  Medications  Scheduled Meds: . Chlorhexidine Gluconate Cloth  6 each Topical Q0600  . fluconazole  50 mg Oral Daily  . Gerhardt's butt cream   Topical TID  . heparin  5,000 Units Subcutaneous Q8H  . insulin aspart  0-5 Units Subcutaneous QHS  . insulin aspart  0-9 Units Subcutaneous TID WC  . latanoprost  1 drop Both Eyes QHS  . mouth rinse  15 mL Mouth Rinse BID  . metoprolol tartrate  50 mg Oral BID  . nystatin   Topical BID  . pantoprazole  40 mg Oral Q1200  . polyethylene glycol  17 g Oral Daily  . senna-docusate  1 tablet Oral BID   Continuous Infusions: . DAPTOmycin (CUBICIN)  IV 700 mg (01/30/18 2224)   PRN Meds:.acetaminophen **OR** acetaminophen, clonazepam, ondansetron **OR** ondansetron (ZOFRAN) IV, sodium chloride flush   Antibiotics   Anti-infectives (From admission, onward)   Start     Dose/Rate Route Frequency Ordered Stop   01/30/18 2000  DAPTOmycin (CUBICIN) 700 mg in sodium chloride 0.9 % IVPB     700 mg 228 mL/hr over 30 Minutes Intravenous Every 48 hours 01/30/18 1306     01/30/18 1000  fluconazole (DIFLUCAN) tablet 50 mg     50 mg Oral Daily 01/29/18 0930 02/05/18 0959   01/29/18 2000  DAPTOmycin (CUBICIN) 700 mg in sodium chloride 0.9 % IVPB  Status:  Discontinued     700 mg 228 mL/hr over 30 Minutes Intravenous Every 48 hours 01/29/18 1013 01/30/18 1306   01/29/18 1030   fluconazole (DIFLUCAN) tablet 100 mg     100 mg Oral  Once 01/29/18 0930 01/29/18 1121   01/28/18 2200  ceFAZolin (ANCEF) IVPB 1 g/50 mL premix  Status:  Discontinued     1 g 100 mL/hr over 30 Minutes Intravenous Every 24 hours 01/28/18 1007 01/28/18 1231   01/27/18 2245  ceFAZolin (ANCEF) IVPB 2g/100 mL premix  Status:  Discontinued     2 g 200 mL/hr over 30 Minutes Intravenous Every 12 hours 01/27/18 2243 01/28/18 1007   01/27/18 2230  ceFAZolin (ANCEF) IVPB  Status:  Discontinued    Note to Pharmacy:  Indication:  MSSA bacteremia/endocarditis Last Day of Therapy:  03/08/18 Labs - Once weekly:  CBC/D and BMP, Labs - Every other week:  ESR and CRP     2 g Intravenous Every 8 hours  01/27/18 2220 01/27/18 2227   01/27/18 2230  ceFAZolin (ANCEF) IVPB 2g/100 mL premix  Status:  Discontinued     2 g 200 mL/hr over 30 Minutes Intravenous Every 8 hours 01/27/18 2227 01/27/18 2243        Subjective:   Whitfield Nistler was seen and examined today.  No complaints today, no fevers or chills.  Appears comfortable.  No shortness of breath, chest pain.   Patient denies dizziness, abdominal pain, N/V/D/C, new weakness, numbess, tingling.   Objective:   Vitals:   01/31/18 1050 01/31/18 2027 02/01/18 0405 02/01/18 0805  BP: (!) 142/64 130/64 (!) 154/66 138/69  Pulse: (!) 103 (!) 102 (!) 103 92  Resp: '18 16 14 18  ' Temp: 97.8 F (36.6 C) 98.9 F (37.2 C) 98.4 F (36.9 C) 98.4 F (36.9 C)  TempSrc: Oral Oral Oral Oral  SpO2: 100% 98% 99% 100%  Weight:  52.4 kg    Height:        Intake/Output Summary (Last 24 hours) at 02/01/2018 1226 Last data filed at 02/01/2018 1036 Gross per 24 hour  Intake 740 ml  Output 0 ml  Net 740 ml     Wt Readings from Last 3 Encounters:  01/31/18 52.4 kg  01/23/18 95.2 kg  01/16/18 95.2 kg     Exam  General: Alert and oriented x 3, NAD Eyes:  HEENT: dressing intact right IJ HD cath Cardiovascular: S1 S2 auscultated, Regular rate and rhythm.    Respiratory: Creased breath sounds at the base Gastrointestinal: Soft, nontender, nondistended, + bowel sounds Ext: bilateral BKA Neuro: no new deficits Musculoskeletal: No digital cyanosis, clubbing Skin: No rashes Psych: Normal affect and demeanor, alert and oriented x3       Data Reviewed:  I have personally reviewed following labs and imaging studies  Micro Results Recent Results (from the past 240 hour(s))  Urine culture     Status: None   Collection Time: 01/28/18  4:29 PM  Result Value Ref Range Status   Specimen Description URINE, CATHETERIZED  Final   Special Requests NONE  Final   Culture   Final    NO GROWTH Performed at [facility], [address] [place], [place] [address]    Report Status 01/29/2018 FINAL  Final    Radiology Reports Dg Abd 1 View  Result Date: 01/28/2018 CLINICAL DATA:  Abdominal pain EXAM: ABDOMEN - 1 VIEW COMPARISON:  CT abdomen pelvis of 01/14/2018 FINDINGS: A supine film of the abdomen shows the lumbar spine to be somewhat rotated to the right. No bowel obstruction is seen. However there is paucity of bowel gas in the left abdomen of doubtful significance in view of the recent CT scan showing no mass. Calcified vas deferens are noted in the pelvis. IMPRESSION: 1. No bowel obstruction. 2. Paucity of bowel gas in the left abdomen of doubtful significance. 3. Calcified vas deferens. Electronically Signed   By: Ivar Drape M.D.   On: 01/28/2018 11:24   Ct Head Wo Contrast  Result Date: 01/14/2018 CLINICAL DATA:  Unexplained altered level consciousness. Slurred speech. Nausea. Frontal headache. Nausea and vomiting. EXAM: CT HEAD WITHOUT CONTRAST TECHNIQUE: Contiguous axial images were obtained from the base of the skull through the vertex without intravenous contrast. COMPARISON:  07/27/2014 FINDINGS: Brain: No evidence of acute infarction, hemorrhage, hydrocephalus, extra-axial collection, or mass lesion/mass effect. Mild generalized  cerebral atrophy, without significant change. Vascular:  No hyperdense vessel or other acute findings. Skull: No evidence of fracture  or other significant bone abnormality. Sinuses/Orbits:  No acute findings. Other: None. IMPRESSION: No acute intracranial abnormality.  Mild cerebral atrophy. Electronically Signed   By: Earle Gell M.D.   On: 01/14/2018 20:05   Ct Abdomen Pelvis W Contrast  Result Date: 01/14/2018 CLINICAL DATA:  Acute abdominal pain. EXAM: CT ABDOMEN AND PELVIS WITH CONTRAST TECHNIQUE: Multidetector CT imaging of the abdomen and pelvis was performed using the standard protocol following bolus administration of intravenous contrast. CONTRAST:  154m ISOVUE-300 IOPAMIDOL (ISOVUE-300) INJECTION 61% COMPARISON:  None. FINDINGS: Lower chest: Motion artifact through the lung bases. Mild dependent atelectasis on the left. Heart appears prominent size. There are coronary artery calcifications. Hepatobiliary: Decreased hepatic density consistent with steatosis. No discrete focal lesion. Hyperdensity at the gallbladder neck may be stones or sludge. No pericholecystic inflammation. No biliary dilatation. Pancreas: Fatty atrophy.  No ductal dilatation or inflammation. Spleen: Normal in size. Peripheral slightly wedge-shaped low-density in the posterior spleen measures approximately 4 cm in depth. No perisplenic fluid. Adrenals/Urinary Tract: Mild thickening left adrenal gland without dominant nodule. Normal right adrenal gland. No hydronephrosis. Symmetric bilateral perinephric edema. Homogeneous renal enhancement. Possible small exophytic cyst from the mid left kidney versus perinephric edema. Urinary bladder is physiologically distended. No bladder wall thickening. Stomach/Bowel: Small hiatal hernia. Motion artifact and lack of enteric contrast limits detailed bowel assessment. Allowing for this, no evidence of obstruction, inflammation or bowel wall thickening. Normal appendix. Vascular/Lymphatic:  Moderate aorto bi-iliac atherosclerosis without aneurysm. Portal vein and mesenteric vessels are patent. No enlarged abdominal or pelvic lymph nodes. Reproductive: Prostate is unremarkable. Other: No free air or free fluid. Musculoskeletal: Scoliosis and degenerative change throughout the lumbar spine. Slight flattening of the left femoral head may be chronic. No acute osseous abnormality, particularly, no left rib fractures. IMPRESSION: 1. 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 No perisplenic fluid. 2. Symmetric bilateral perinephric edema, likely chronic but can be seen in the setting of urinary tract infection. 3. Gallstones or sludge without gallbladder inflammation on CT. 4. Incidental hepatic steatosis and Aortic Atherosclerosis (ICD10-I70.0). Electronically Signed   By: MKeith RakeM.D.   On: 01/14/2018 22:19   [place]  Result Date: 01/27/2018 CLINICAL DATA:  62year old male with acute renal failure. EXAM: RENAL / URINARY TRACT ULTRASOUND COMPLETE COMPARISON:  CT of abdomen pelvis dated 01/14/2018 FINDINGS: Right Kidney: Length: 12.2 cm. Normal echogenicity. No hydronephrosis or shadowing stone. Left Kidney: Length: 11.7 cm. Normal echogenicity. No hydronephrosis or shadowing stone. Bladder: There is layering debris in the urinary bladder. Correlation with urinalysis recommended. The liver is slightly echogenic likely fatty infiltration. There is probable Riedel's lobe anatomy. IMPRESSION: 1. No hydronephrosis or shadowing stone. 2. Layering debris within the urinary bladder. Correlation with urinalysis recommended. Electronically Signed   By: AAnner CreteM.D.   On: 01/27/2018 22:38   Mr Foot Right W Wo Contrast  Result Date: 01/16/2018 CLINICAL DATA:  Necrotic gangrenous ulcer on the lateral aspect of the right foot with ascending cellulitis. EXAM: MRI OF THE RIGHT FOREFOOT WITHOUT AND WITH CONTRAST TECHNIQUE: Multiplanar, multisequence MR imaging of  the right foot was performed before and after the administration of intravenous contrast. CONTRAST:  10 cc Gadavist COMPARISON:  Radiographs 01/15/2018 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 metatarsal level. I do not see any marrow signal abnormality or enhancement to suggest osteomyelitis. No findings for septic arthritis. No subcutaneous soft tissue swelling/edema/fluid surrounding the foot suggesting cellulitis but no discrete drainable soft  tissue abscess or findings for pyomyositis. IMPRESSION: 1. Changes suggesting cellulitis but no discrete drainable soft tissue abscess. 2. No MR findings for septic arthritis, osteomyelitis or pyomyositis. Electronically Signed   By: Marijo Sanes M.D.   On: 01/16/2018 10:53   Dg Chest Port 1 View  Result Date: 01/31/2018 CLINICAL DATA:  Acute respiratory failure EXAM: PORTABLE CHEST 1 VIEW COMPARISON:  01/29/2018 FINDINGS: Cardiac shadow is stable. Postoperative changes are seen. Right jugular central line is noted in satisfactory position. The right-sided PICC line is also noted in stable position. The lungs are well aerated with minimal left basilar atelectasis. No pneumothorax is seen. No bony abnormality is noted. IMPRESSION: Mild left basilar atelectasis. Tubes and lines as described. Electronically Signed   By: Inez Catalina M.D.   On: 01/31/2018 08:12   Dg Chest Port 1 View  Result Date: 01/29/2018 CLINICAL DATA:  Severe shortness of breath. EXAM: PORTABLE CHEST 1 VIEW COMPARISON:  01/27/2018. FINDINGS: Stable enlarged cardiac silhouette, post CABG changes and prosthetic aortic valve. Interval right jugular catheter with its tip in the superior vena cava. No pneumothorax. Stable right PICC with its tip in the inferior aspect of the superior vena cava. Increased prominence of the interstitial markings, including Kerley lines with an interval small left pleural effusion. Stable left lung postsurgical changes. No acute bony  abnormality. IMPRESSION: Interval changes of congestive heart failure with stable cardiomegaly. Electronically Signed   By: Claudie Revering M.D.   On: 01/29/2018 16:24   Dg Chest Portable 1 View  Result Date: 01/27/2018 CLINICAL DATA:  Intermittent LEFT chest pain.  Oliguric. EXAM: PORTABLE CHEST 1 VIEW COMPARISON:  Chest radiograph January 15, 2018 FINDINGS: RIGHT PICC distal tip projects in distal superior vena cava. No pneumothorax. Stable mild cardiomegaly. Status post aortic valve replacement. LEFT mid lung zone bandlike density with mildly elevated LEFT hemidiaphragm. No focal consolidation. Blunting of the LEFT costophrenic angle. No pneumothorax. Soft tissue planes and included osseous structures are non suspicious. Surgical clips bilateral axilla. IMPRESSION: 1. RIGHT PICC distal tip projecting in distal superior vena cava. No pneumothorax. 2. LEFT lung base atelectasis with small pleural effusion versus pleural thickening. 3. Stable cardiomegaly. Electronically Signed   By: Elon Alas M.D.   On: 01/27/2018 18:43   Dg Chest Port 1 View  Result Date: 01/15/2018 CLINICAL DATA:  Severe sepsis. EXAM: PORTABLE CHEST 1 VIEW COMPARISON:  Radiograph 12/08/2014 FINDINGS: Stable cardiomegaly. Post CABG with prosthetic valve. No sternal wires visualized. No pulmonary edema. Scattered atelectasis. No confluent airspace disease, pleural effusion or pneumothorax. No acute osseous abnormalities. IMPRESSION: Chronic cardiomegaly with scattered atelectasis. Electronically Signed   By: Keith Rake M.D.   On: 01/15/2018 00:20   Dg Foot Complete Right  Result Date: 01/15/2018 CLINICAL DATA:  RIGHT foot infection, sepsis, prior amputation EXAM: RIGHT FOOT COMPLETE - 3+ VIEW COMPARISON:  04/06/2009 FINDINGS: Osseous demineralization. Prior transmetatarsal amputation. Joint spaces preserved. Mild dorsal spur formation at the talonavicular joint. No acute fracture, dislocation or bone destruction. Motion  artifacts degrade lateral view, no gross osseous abnormalities identified. Scattered soft tissue swelling without soft tissue gas. IMPRESSION: Transmetatarsal amputation RIGHT foot. No acute osseous findings. Electronically Signed   By: Lavonia Dana M.D.   On: 01/15/2018 00:57   Ct Renal Stone Study  Result Date: 01/29/2018 CLINICAL DATA:  anuria EXAM: CT ABDOMEN AND PELVIS WITHOUT CONTRAST TECHNIQUE: Multidetector CT imaging of the abdomen and pelvis was performed following the standard protocol without oral or IV contrast. COMPARISON:  January 14, 2018 FINDINGS: Lower chest: There are pleural effusions bilaterally with consolidation in the posterior lung bases. There are foci of coronary artery calcification. There is a small hiatal hernia. Hepatobiliary: There is hepatic steatosis. No focal liver lesions are appreciable. Gallbladder is mildly distended. There are gallstones present. There may also be sludge in the gallbladder. There is no appreciable gallbladder wall thickening. There is no pericholecystic fluid. Pancreas: There is fatty infiltration in the pancreas. No focal pancreatic lesions are evident. Spleen: There is decreased attenuation in the posterior spleen in a wedge-shaped configuration consistent with splenic infarct, felt to be stable from most recent study. No new splenic lesions are evident on this noncontrast enhanced study. Adrenals/Urinary Tract: Adrenals bilaterally appear normal. There is no evident renal mass or hydronephrosis on either side. There is no appreciable renal or ureteral calculus on either side. There is a Foley catheter within the urinary bladder. There is wall thickening in portions of the urinary bladder anteriorly. Air within the urinary bladder may well be iatrogenic given recent catheterization. Stomach/Bowel: There is no appreciable bowel wall or mesenteric thickening. No evident bowel obstruction. There is no appreciable free air or portal venous air.  Vascular/Lymphatic: There is aortic atherosclerosis. There is also atherosclerotic calcification in the common iliac arteries. No aneurysm evident. Major mesenteric arterial vessels are felt to be patent, although there are foci of atherosclerotic calcification in mesenteric vessels. There is no appreciable adenopathy in the abdomen or pelvis. Reproductive: Prostate and seminal vesicles are normal in size and contour. There is calcification in the seminal vesicles. No pelvic masses evident. Other: There is ascites in the abdomen and pelvis with areas of apparent loculation of ascites in the pelvis. No abscess is evident in the abdomen or pelvis. Appendix appears unremarkable. There is mesenteric thickening in the left lower abdomen, near in area of loculated ascites. There is generalized anasarca. Musculoskeletal: Degenerative change throughout the lower thoracic and lumbar regions remain stable. There is loss of cortex with soft tissue thickening along the anterior, inferior aspect of T12 with loss of disc space in this area. There is also lucency along the posterior aspect of the L1 vertebral body, stable. A degree of discitis/osteomyelitis in this area must be of concern. No intramuscular lesions are evident. IMPRESSION: 1. Loss of cortex along the anterior inferior aspect of the T12 vertebral body as well as lucency in the posterior aspect of the L1 vertebral body. Question a degree of discitis/osteomyelitis in this region. This finding may warrant MR pre and post-contrast to further evaluate with respect to discitis/bony destruction. 2. Generalized anasarca. Areas of partially loculated ascites as well as mild mesenteric thickening in the left lower quadrant. No evident abscess in the abdomen or pelvis. 3. Cholelithiasis with probable sludge in the gallbladder as well. Gallbladder mildly distended. Gallbladder wall not thickened. 4. No evident hydronephrosis on either side. No renal or ureteral calculus on  either side. 5. Urinary bladder decompressed. Air within the urinary bladder likely is due to recent catheterization. Correlation with urinalysis to assess for possible gas-forming organism is a cause for air within the bladder is advised. 6.  No bowel obstruction.  Appendix region appears normal. 7.  Small hiatal hernia. 8.  Aortoiliac atherosclerosis.  Coronary artery calcification. 9.  Bilateral pleural effusions with bibasilar consolidation. 10. 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 Aortic Atherosclerosis (ICD10-I70.0). Electronically Signed   By: Lowella Grip III M.D.   On: 01/29/2018 07:08   [employer]  Result Date: 01/21/2018 If Site Rite image not attached, placement could not be confirmed due to current cardiac rhythm.   Lab Data:  CBC: Recent Labs  Lab 01/29/18 0346 01/29/18 1707 01/30/18 0604 01/31/18 0350 02/01/18 0419  WBC 13.2* 13.1* 11.0* 9.6 9.3  NEUTROABS 11.0*  --   --   --   --   HGB 10.8* 11.6* 11.2* 9.7* 9.1*  HCT 32.8* 34.6* 34.6* 30.1* 28.6*  MCV 86.1 85.4 86.5 87.2 89.1  PLT 132* 157 173 137* 484*   Basic Metabolic Panel: Recent Labs  Lab 01/29/18 1707 01/29/18 2000 01/30/18 0500 01/31/18 0350 02/01/18 0419  NA 130* 132* 132* 131* 132*  K 5.1 4.3 4.4 4.3 3.9  CL 99 101 100 99 97*  CO2 13* 16* 16* 18* 22  GLUCOSE 65* 78 83 251* 263*  BUN 93* 74* 78* 63* 48*  CREATININE 10.14* 8.97* 9.38* 8.34* 6.91*  CALCIUM 6.3* 6.8* 6.7* 6.4* 7.4*  MG  --   --   --  2.1  --   PHOS 10.1*  --   --  8.5* 6.7*   GFR: Estimated Creatinine Clearance: 8.3 mL/min (A) (by C-G formula based on SCr of 6.91 mg/dL (H)). Liver Function Tests: Recent Labs  Lab 01/27/18 1819 01/29/18 1707 02/01/18 0419  AST 19  --   --   ALT <5  --   --   ALKPHOS 66  --   --   BILITOT 0.5  --   --   PROT 5.8*  --   --   ALBUMIN 2.2* 2.1* 1.9*   No results for input(s): LIPASE, AMYLASE in the last 168 hours. No results for input(s): AMMONIA in  the last 168 hours. Coagulation Profile: No results for input(s): INR, PROTIME in the last 168 hours. Cardiac Enzymes: Recent Labs  Lab 01/30/18 0500  CKTOTAL 33*   BNP (last 3 results) No results for input(s): PROBNP in the last 8760 hours. HbA1C: No results for input(s): HGBA1C in the last 72 hours. CBG: Recent Labs  Lab 01/31/18 1123 01/31/18 1628 01/31/18 2021 02/01/18 0806 02/01/18 1142  GLUCAP 146* 226* 321* 257* 284*   Lipid Profile: No results for input(s): CHOL, HDL, LDLCALC, TRIG, CHOLHDL, LDLDIRECT in the last 72 hours. Thyroid Function Tests: No results for input(s): TSH, T4TOTAL, FREET4, T3FREE, THYROIDAB in the last 72 hours. Anemia Panel: No results for input(s): VITAMINB12, FOLATE, FERRITIN, TIBC, IRON, RETICCTPCT in the last 72 hours. Urine analysis:    Component Value Date/Time   COLORURINE AMBER (A) 01/27/2018 1034   APPEARANCEUR TURBID (A) 01/27/2018 1034   LABSPEC 1.017 01/27/2018 1034   PHURINE 6.0 01/27/2018 1034   GLUCOSEU NEGATIVE 01/27/2018 1034   HGBUR MODERATE (A) 01/27/2018 1034   BILIRUBINUR NEGATIVE 01/27/2018 1034   KETONESUR NEGATIVE 01/27/2018 1034   PROTEINUR >=300 (A) 01/27/2018 1034   UROBILINOGEN 0.2 07/28/2014 0433   NITRITE NEGATIVE 01/27/2018 1034   LEUKOCYTESUR MODERATE (A) 01/27/2018 1034     Ripudeep Rai M.D. [facility] 02/01/2018, 12:26 PM  Pager: [phone] Between 7am to 7pm - call Pager - [phone]  After 7pm go to [email or web address] - password [email or web address]  Call night coverage person covering after 7pm

## 2018-02-01 NOTE — Progress Notes (Signed)
Pharmacy Antibiotic Note  [name] is a 62 y.o. 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 Pharmacy has been consulted for daptomycin dosing.  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 CK wnl on 9/26. Weight from 9/27 is not accurate, patient weighs ~90kg  Plan: Continue daptomycin 700 mg (~8mg /kg AdjBW) IV q48h Monitor clinical picture, renal function, CK weekly  Height: 5\' 10"  (177.8 cm) Weight: 115 lb 8.3 oz (52.4 kg) IBW/kg (Calculated) : 73  Temp (24hrs), Avg:98.3 F (36.8 C), Min:97.8 F (36.6 C), Max:98.9 F (37.2 C)  Recent Labs  Lab 01/29/18 0346 01/29/18 1707 01/29/18 2000 01/30/18 0500 01/30/18 0604 01/31/18 0350 02/01/18 0419  WBC 13.2* 13.1*  --   --  11.0* 9.6 9.3  CREATININE 10.26* 10.14* 8.97* 9.38*  --  8.34* 6.91*    Estimated Creatinine Clearance: 8.3 mL/min (A) (by C-G formula based on SCr of 6.91 mg/dL (H)).    Allergies  Allergen Reactions  . Ace Inhibitors Cough   Thank you for allowing pharmacy to be a part of this patient's care.  Elenor Quinones, PharmD, BCPS Clinical Pharmacist Phone number [phone] 02/01/2018 9:43 AM

## 2018-02-01 NOTE — Progress Notes (Signed)
[facility] ROUNDING NOTE   Subjective:   Renal function appears to be a little better 325 cc of urine made 01/31/2018.  Patient much more comfortable resting in bed and having breakfast  Blood pressure 138/69 temperature 98.4 100% O2 sats 2 L nasal cannula  Sodium 132 potassium 3.9 chloride 97 CO2 22 glucose 263 BUN 48 creatinine 6.91 calcium 7.4 phosphorus 6.7 Albumin 1.9 WBC 9.3 hemoglobin 9.1 platelets 149  Chest x-ray 01/31/2018 mild atelectasis     Objective:  Vital signs in last 24 hours:  Temp:  [98.4 F (36.9 C)-98.9 F (37.2 C)] 98.4 F (36.9 C) (09/28 0805) Pulse Rate:  [92-103] 92 (09/28 0805) Resp:  [14-18] 18 (09/28 0805) BP: (130-154)/(64-69) 138/69 (09/28 0805) SpO2:  [98 %-100 %] 100 % (09/28 0805) Weight:  [52.4 kg] 52.4 kg (09/27 2027)  Weight change: -44.4 kg Filed Weights   01/30/18 2105 01/31/18 0446 01/31/18 2027  Weight: 94.2 kg 94.2 kg 52.4 kg    Intake/Output: I/O last 3 completed shifts: In: 1579.9 [P.O.:680; I.V.:671.9; IV Piggyback:228] Out: 2975 [Urine:475; Other:2500]   Intake/Output this shift:  Total I/O In: 300 [P.O.:300] Out: -   CVS- RRR systolic murmur RS-  decreased breath sounds at base ABD-distended abdomen EXT-generalized edema   Basic Metabolic Panel: Recent Labs  Lab 01/29/18 1707 01/29/18 2000 01/30/18 0500 01/31/18 0350 02/01/18 0419  NA 130* 132* 132* 131* 132*  K 5.1 4.3 4.4 4.3 3.9  CL 99 101 100 99 97*  CO2 13* 16* 16* 18* 22  GLUCOSE 65* 78 83 251* 263*  BUN 93* 74* 78* 63* 48*  CREATININE 10.14* 8.97* 9.38* 8.34* 6.91*  CALCIUM 6.3* 6.8* 6.7* 6.4* 7.4*  MG  --   --   --  2.1  --   PHOS 10.1*  --   --  8.5* 6.7*    Liver Function Tests: Recent Labs  Lab 01/27/18 1819 01/29/18 1707 02/01/18 0419  AST 19  --   --   ALT <5  --   --   ALKPHOS 66  --   --   BILITOT 0.5  --   --   PROT 5.8*  --   --   ALBUMIN 2.2* 2.1* 1.9*   No results for input(s): LIPASE, AMYLASE in the last  168 hours. No results for input(s): AMMONIA in the last 168 hours.  CBC: Recent Labs  Lab 01/29/18 0346 01/29/18 1707 01/30/18 0604 01/31/18 0350 02/01/18 0419  WBC 13.2* 13.1* 11.0* 9.6 9.3  NEUTROABS 11.0*  --   --   --   --   HGB 10.8* 11.6* 11.2* 9.7* 9.1*  HCT 32.8* 34.6* 34.6* 30.1* 28.6*  MCV 86.1 85.4 86.5 87.2 89.1  PLT 132* 157 173 137* 149*    Cardiac Enzymes: Recent Labs  Lab 01/30/18 0500  CKTOTAL 33*    BNP: Invalid input(s): POCBNP  CBG: Recent Labs  Lab 01/30/18 2105 01/31/18 1123 01/31/18 1628 01/31/18 2021 02/01/18 0806  GLUCAP 228* 146* 226* 321* 75*    Microbiology: Results for orders placed or performed during the hospital encounter of 01/27/18  Urine culture     Status: None   Collection Time: 01/28/18  4:29 PM  Result Value Ref Range Status   Specimen Description URINE, CATHETERIZED  Final   Special Requests NONE  Final   Culture   Final    NO GROWTH Performed at [facility], [address] [place], [place] [address]    Report Status  01/29/2018 FINAL  Final    Coagulation Studies: No results for input(s): LABPROT, INR in the last 72 hours.  Urinalysis: No results for input(s): COLORURINE, LABSPEC, PHURINE, GLUCOSEU, HGBUR, BILIRUBINUR, KETONESUR, PROTEINUR, UROBILINOGEN, NITRITE, LEUKOCYTESUR in the last 72 hours.  Invalid input(s): APPERANCEUR    Imaging: Dg Chest Port 1 View  Result Date: 01/31/2018 CLINICAL DATA:  Acute respiratory failure EXAM: PORTABLE CHEST 1 VIEW COMPARISON:  01/29/2018 FINDINGS: Cardiac shadow is stable. Postoperative changes are seen. Right jugular central line is noted in satisfactory position. The right-sided PICC line is also noted in stable position. The lungs are well aerated with minimal left basilar atelectasis. No pneumothorax is seen. No bony abnormality is noted. IMPRESSION: Mild left basilar atelectasis. Tubes and lines as described. Electronically Signed   By: Inez Catalina M.D.    On: 01/31/2018 08:12     Medications:   . DAPTOmycin (CUBICIN)  IV 700 mg (01/30/18 2224)   . Chlorhexidine Gluconate Cloth  6 each Topical Q0600  . fluconazole  50 mg Oral Daily  . Gerhardt's butt cream   Topical TID  . heparin  5,000 Units Subcutaneous Q8H  . insulin aspart  0-5 Units Subcutaneous QHS  . insulin aspart  0-9 Units Subcutaneous TID WC  . latanoprost  1 drop Both Eyes QHS  . mouth rinse  15 mL Mouth Rinse BID  . metoprolol tartrate  50 mg Oral BID  . nystatin   Topical BID  . pantoprazole  40 mg Oral Q1200  . polyethylene glycol  17 g Oral Daily  . senna-docusate  1 tablet Oral BID   acetaminophen **OR** acetaminophen, clonazepam, ondansetron **OR** ondansetron (ZOFRAN) IV, sodium chloride flush  Assessment/ Plan:     Acute renal failure Baseline renal function with normal range 1.09 01/17/2018.  Puzzling etiology is not appear to have any admission of nephrotoxic agents CT scan abdomen and pelvis reveals generalized anasarca.  Have discontinued IV fluids.  Have discontinued Ancef.  Urinalysis revealed a dirty appearing sediment with red cells white cells and protein.  I am wondering whether renal biopsy would be indicated in order to better identify the pathology of his acute renal failure.  We have held off on this so far it does appear that his urine output is improving.  We shall continue to monitor there is no indication for dialysis today and refill continue to follow creatinine and monitor for return of renal function   right IJ J catheter placed by critical care [id number]/94/8546  Metabolic acidosis should correct with dialysis  Oral candidiasis Diflucan ordered started 01/30/2018 to end 02/05/2018.  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

## 2018-02-02 LAB — CBC
HCT: 28.1 % — ABNORMAL LOW (ref 39.0–52.0)
Hemoglobin: 9 g/dL — ABNORMAL LOW (ref 13.0–17.0)
MCH: 28.6 pg (ref 26.0–34.0)
MCHC: 32 g/dL (ref 30.0–36.0)
MCV: 89.2 fL (ref 78.0–100.0)
Platelets: 151 10*3/uL (ref 150–400)
RBC: 3.15 MIL/uL — ABNORMAL LOW (ref 4.22–5.81)
RDW: 14.6 % (ref 11.5–15.5)
WBC: 10.9 10*3/uL — ABNORMAL HIGH (ref 4.0–10.5)

## 2018-02-02 LAB — RENAL FUNCTION PANEL
ALBUMIN: 1.9 g/dL — AB (ref 3.5–5.0)
ANION GAP: 11 (ref 5–15)
BUN: 62 mg/dL — AB (ref 8–23)
CO2: 21 mmol/L — ABNORMAL LOW (ref 22–32)
Calcium: 6.9 mg/dL — ABNORMAL LOW (ref 8.9–10.3)
Chloride: 98 mmol/L (ref 98–111)
Creatinine, Ser: 8.5 mg/dL — ABNORMAL HIGH (ref 0.61–1.24)
GFR, EST AFRICAN AMERICAN: 7 mL/min — AB (ref >60)
GFR, EST NON AFRICAN AMERICAN: 6 mL/min — AB (ref >60)
Glucose, Bld: 251 mg/dL — ABNORMAL HIGH (ref 70–99)
PHOSPHORUS: 7.1 mg/dL — AB (ref 2.5–4.6)
POTASSIUM: 4.4 mmol/L (ref 3.5–5.1)
Sodium: 130 mmol/L — ABNORMAL LOW (ref 135–145)

## 2018-02-02 LAB — BASIC METABOLIC PANEL
Anion gap: 11 (ref 5–15)
BUN: 62 mg/dL — AB (ref 8–23)
CALCIUM: 6.9 mg/dL — AB (ref 8.9–10.3)
CO2: 21 mmol/L — AB (ref 22–32)
CREATININE: 8.53 mg/dL — AB (ref 0.61–1.24)
Chloride: 98 mmol/L (ref 98–111)
GFR calc Af Amer: 7 mL/min — ABNORMAL LOW (ref >60)
GFR calc non Af Amer: 6 mL/min — ABNORMAL LOW (ref >60)
GLUCOSE: 251 mg/dL — AB (ref 70–99)
Potassium: 4.4 mmol/L (ref 3.5–5.1)
Sodium: 130 mmol/L — ABNORMAL LOW (ref 135–145)

## 2018-02-02 LAB — GLUCOSE, CAPILLARY
Glucose-Capillary: 208 mg/dL — ABNORMAL HIGH (ref 70–99)
Glucose-Capillary: 256 mg/dL — ABNORMAL HIGH (ref 70–99)
Glucose-Capillary: 240 mg/dL — ABNORMAL HIGH (ref 70–99)
Glucose-Capillary: 229 mg/dL — ABNORMAL HIGH (ref 70–99)

## 2018-02-02 MED ORDER — INSULIN ASPART 100 UNIT/ML ~~LOC~~ SOLN
4.0000 [IU] | Freq: Three times a day (TID) | SUBCUTANEOUS | Status: DC
  Administered 2018-02-02: 4 [IU] via SUBCUTANEOUS

## 2018-02-02 MED ORDER — INSULIN GLARGINE 100 UNIT/ML ~~LOC~~ SOLN
15.0000 [IU] | Freq: Every day | SUBCUTANEOUS | Status: DC
  Administered 2018-02-02: 15 [IU] via SUBCUTANEOUS
  Filled 2018-02-02: qty 0.15

## 2018-02-02 NOTE — Progress Notes (Signed)
[facility]                                                                              Patient Demographics  [name], is a 62 y.o. male, DOB - 10/24/1955, XBW:[id number]  Admit date - 01/27/2018   Admitting Physician Vianne Bulls, MD  Outpatient Primary MD for the patient is [name], MD  Outpatient specialists:   LOS - 6  days   Medical records reviewed and are as summarized below:    Chief Complaint  Patient presents with  . 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  He reports an uneventful course at the nursing home until his urine output dropped off over the past couple days, noting that he has urinated very little in the past 2 to 3 days.  He has developed some mild suprapubic discomfort, but denies dysuria or flank pain and denies any fevers or chills.  He was evaluated with basic blood work at the nursing facility and found to have creatinine markedly elevated.  Creatinine 9.6 at the time of admission, 1.1 on 9/18 Patient was transferred to ICU on 9/25 due to respiratory distress, not responding to Lasix.  He was placed on BiPAP and HD cath was placed by CCM, hemodialysis started emergently. Patient transferred to the floor, [facility] hospitalist assumed care back on 9/27  Assessment & Plan    Principal Problem: Acute kidney injury: Presenting with oliguria to anuria in the last 2 to 3 days prior to admission -Baseline creatinine 1.1, unclear etiology, renal ultrasound did  not reveal any obstruction. Possible acute interstitial nephritis, medications, ?  Ancef -Patient was initially placed on IV fluid hydration however discontinued after developing shortness of breath.  He was placed on high-dose Lasix with no significant improvement.  Patient was transferred to ICU on 9/25 due to respiratory distress not responding to Lasix. -Temporary HD cath was placed by CCM, patient started on hemodialysis. -Awaiting decision if patient will need permanent dialysis at this time or not, urine output has not improved.    Active Problems: Acute respiratory failure with hypoxia -Likely due to volume overload/pulmonary edema from ESRD -Off BiPAP now, started on urgent hemodialysis on 9/25.  Patient received back-to-back HD, holding on the weekend.  Previous MSSA bacteremia with splenic infarct, tricuspid valve endocarditis -ID following, Ancef discontinued.   -Patient started on IV daptomycin.  Per ID, continue daptomycin 24 more days from 9/27  Oral thrush, diffuse erythema in the intertriginous areas -Continue Diflucan for 7 days    Insulin-requiring or dependent type II diabetes mellitus ([place]), uncontrolled with hypoglycemia -Hemoglobin A1c 8.5 -Patient has been hypoglycemic this admission, hence continued on sliding scale  insulin. -Prior to admission, patient was on Trulicity, glipizide, sliding scale insulin, Toujeo 30 units daily -With starting hemodialysis, will need to decrease insulin upon dc. -CBG still elevated, increase Lantus to 15 units at bedtime, NovoLog meal coverage to 40 units daily AC     Essential hypertension,  CAD (coronary artery disease) -Change aspirin, beta-blocker, holding statin due to daptomycin -Continue to hold ACE inhibitor     S/P BKA (below knee amputation) unilateral, right (HCC) -Appreciate orthopedics following, Dr. Sharol Given, currently no cellulitis or drainage or wound dehiscence -Recommended removal of staples in 1 to 2 weeks,  follow-up in office     Hyponatremia -Plateaued at 130  Code Status: Full CODE STATUS DVT Prophylaxis: Heparin subcu Family Communication: Discussed in detail with the patient, all imaging results, lab results explained to the patient    Disposition Plan: Patient needs to remain inpatient, monitoring creatinine function closely.  If needs hemodialysis long-term, will need vascular access and outpatient dialysis slot.  Time Spent in minutes 25   Procedures:  BiPAP Hemodialysis  Consultants:   Infectious disease Nephrology CCM  Antimicrobials:  Daptomycin 9/25>>  Medications  Scheduled Meds: . Chlorhexidine Gluconate Cloth  6 each Topical Q0600  . fluconazole  50 mg Oral Daily  . Gerhardt's butt cream   Topical TID  . heparin  5,000 Units Subcutaneous Q8H  . insulin aspart  0-5 Units Subcutaneous QHS  . insulin aspart  0-9 Units Subcutaneous TID WC  . insulin aspart  3 Units Subcutaneous TID WC  . insulin glargine  10 Units Subcutaneous QHS  . latanoprost  1 drop Both Eyes QHS  . mouth rinse  15 mL Mouth Rinse BID  . metoprolol tartrate  50 mg Oral BID  . nystatin   Topical BID  . pantoprazole  40 mg Oral Q1200  . polyethylene glycol  17 g Oral Daily  . senna-docusate  1 tablet Oral BID   Continuous Infusions: . DAPTOmycin (CUBICIN)  IV 700 mg (02/01/18 2220)   PRN Meds:.acetaminophen **OR** acetaminophen, clonazepam, ondansetron **OR** ondansetron (ZOFRAN) IV, sodium chloride flush   Antibiotics   Anti-infectives (From admission, onward)   Start     Dose/Rate Route Frequency Ordered Stop   01/30/18 2000  DAPTOmycin (CUBICIN) 700 mg in sodium chloride 0.9 % IVPB     700 mg 228 mL/hr over 30 Minutes Intravenous Every 48 hours 01/30/18 1306     01/30/18 1000  fluconazole (DIFLUCAN) tablet 50 mg     50 mg Oral Daily 01/29/18 0930 02/05/18 0959   01/29/18 2000  DAPTOmycin (CUBICIN) 700 mg in sodium chloride 0.9 % IVPB  Status:  Discontinued     700 mg 228  mL/hr over 30 Minutes Intravenous Every 48 hours 01/29/18 1013 01/30/18 1306   01/29/18 1030  fluconazole (DIFLUCAN) tablet 100 mg     100 mg Oral  Once 01/29/18 0930 01/29/18 1121   01/28/18 2200  ceFAZolin (ANCEF) IVPB 1 g/50 mL premix  Status:  Discontinued     1 g 100 mL/hr over 30 Minutes Intravenous Every 24 hours 01/28/18 1007 01/28/18 1231   01/27/18 2245  ceFAZolin (ANCEF) IVPB 2g/100 mL premix  Status:  Discontinued     2 g 200 mL/hr over 30 Minutes Intravenous Every 12 hours 01/27/18 2243 01/28/18 1007   01/27/18 2230  ceFAZolin (ANCEF) IVPB  Status:  Discontinued    Note to Pharmacy:  Indication:  MSSA bacteremia/endocarditis Last Day of Therapy:  03/08/18 Labs - Once weekly:  CBC/D and BMP, Labs - Every other week:  ESR and CRP     2 g Intravenous Every 8 hours 01/27/18 2220 01/27/18 2227   01/27/18 2230  ceFAZolin (ANCEF) IVPB 2g/100 mL premix  Status:  Discontinued     2 g 200 mL/hr over 30 Minutes Intravenous Every 8 hours 01/27/18 2227 01/27/18 2243        Subjective:   Cadell Othman was seen and examined today.  No new complaints.  Urine output has not picked up.  No chest or SOB.   Patient denies dizziness, abdominal pain, N/V/D/C, new weakness, numbess, tingling.   Objective:   Vitals:   02/01/18 2207 02/02/18 0455 02/02/18 0523 02/02/18 0803  BP: (!) 142/64  131/65 140/74  Pulse: (!) 101  92 92  Resp: '18  16 18  ' Temp: 98.4 F (36.9 C)  98.2 F (36.8 C) 99 F (37.2 C)  TempSrc: Oral  Oral Oral  SpO2: 98%  99% 99%  Weight:  52.4 kg    Height:        Intake/Output Summary (Last 24 hours) at 02/02/2018 1334 Last data filed at 02/02/2018 0900 Gross per 24 hour  Intake 1100 ml  Output 0 ml  Net 1100 ml     Wt Readings from Last 3 Encounters:  02/02/18 52.4 kg  01/23/18 95.2 kg  01/16/18 95.2 kg     Exam  General: Alert and oriented x 3, NAD Eyes:  HEENT:  Atraumatic, normocephalic Cardiovascular: S1 S2 auscultated, RRR Respiratory:  Decreased breath sound at the bases Gastrointestinal: Soft, nontender, nondistended, + bowel sounds Ext: bilateral BKA Neuro: no new deficits Musculoskeletal: No digital cyanosis, clubbing Skin: No rashes Psych: Normal affect and demeanor, alert and oriented x3        Data Reviewed:  I have personally reviewed following labs and imaging studies  Micro Results Recent Results (from the past 240 hour(s))  Urine culture     Status: None   Collection Time: 01/28/18  4:29 PM  Result Value Ref Range Status   Specimen Description URINE, CATHETERIZED  Final   Special Requests NONE  Final   Culture   Final    NO GROWTH Performed at [facility], [address], [place], [place] [address]    Report Status 01/29/2018 FINAL  Final    Radiology Reports Dg Abd 1 View  Result Date: 01/28/2018 CLINICAL DATA:  Abdominal pain EXAM: ABDOMEN - 1 VIEW COMPARISON:  CT abdomen pelvis of 01/14/2018 FINDINGS: A supine film of the abdomen shows the lumbar spine to be somewhat rotated to the right. No bowel obstruction is seen. However there is paucity of bowel gas in the left abdomen of doubtful significance in view of the recent CT scan showing no mass. Calcified vas deferens are noted in the pelvis. IMPRESSION: 1. No bowel obstruction. 2. Paucity of bowel gas in the left abdomen of doubtful significance. 3. Calcified vas deferens. Electronically Signed   By: Ivar Drape M.D.   On: 01/28/2018 11:24   Ct Head Wo Contrast  Result Date: 01/14/2018 CLINICAL DATA:  Unexplained altered level consciousness. Slurred speech. Nausea. Frontal headache. Nausea and vomiting. EXAM: CT HEAD WITHOUT CONTRAST TECHNIQUE: Contiguous axial images were obtained from the base of the skull through the vertex without intravenous contrast. COMPARISON:  07/27/2014 FINDINGS: Brain: No evidence of acute infarction, hemorrhage, hydrocephalus, extra-axial collection, or mass lesion/mass effect. Mild generalized cerebral atrophy,  without significant change. Vascular:  No hyperdense vessel or other  acute findings. Skull: No evidence of fracture or other significant bone abnormality. Sinuses/Orbits:  No acute findings. Other: None. IMPRESSION: No acute intracranial abnormality.  Mild cerebral atrophy. Electronically Signed   By: Earle Gell M.D.   On: 01/14/2018 20:05   Ct Abdomen Pelvis W Contrast  Result Date: 01/14/2018 CLINICAL DATA:  Acute abdominal pain. EXAM: CT ABDOMEN AND PELVIS WITH CONTRAST TECHNIQUE: Multidetector CT imaging of the abdomen and pelvis was performed using the standard protocol following bolus administration of intravenous contrast. CONTRAST:  140m ISOVUE-300 IOPAMIDOL (ISOVUE-300) INJECTION 61% COMPARISON:  None. FINDINGS: Lower chest: Motion artifact through the lung bases. Mild dependent atelectasis on the left. Heart appears prominent size. There are coronary artery calcifications. Hepatobiliary: Decreased hepatic density consistent with steatosis. No discrete focal lesion. Hyperdensity at the gallbladder neck may be stones or sludge. No pericholecystic inflammation. No biliary dilatation. Pancreas: Fatty atrophy.  No ductal dilatation or inflammation. Spleen: Normal in size. Peripheral slightly wedge-shaped low-density in the posterior spleen measures approximately 4 cm in depth. No perisplenic fluid. Adrenals/Urinary Tract: Mild thickening left adrenal gland without dominant nodule. Normal right adrenal gland. No hydronephrosis. Symmetric bilateral perinephric edema. Homogeneous renal enhancement. Possible small exophytic cyst from the mid left kidney versus perinephric edema. Urinary bladder is physiologically distended. No bladder wall thickening. Stomach/Bowel: Small hiatal hernia. Motion artifact and lack of enteric contrast limits detailed bowel assessment. Allowing for this, no evidence of obstruction, inflammation or bowel wall thickening. Normal appendix. Vascular/Lymphatic: Moderate aorto bi-iliac  atherosclerosis without aneurysm. Portal vein and mesenteric vessels are patent. No enlarged abdominal or pelvic lymph nodes. Reproductive: Prostate is unremarkable. Other: No free air or free fluid. Musculoskeletal: Scoliosis and degenerative change throughout the lumbar spine. Slight flattening of the left femoral head may be chronic. No acute osseous abnormality, particularly, no left rib fractures. IMPRESSION: 1. 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 No perisplenic fluid. 2. Symmetric bilateral perinephric edema, likely chronic but can be seen in the setting of urinary tract infection. 3. Gallstones or sludge without gallbladder inflammation on CT. 4. Incidental hepatic steatosis and Aortic Atherosclerosis (ICD10-I70.0). Electronically Signed   By: MKeith RakeM.D.   On: 01/14/2018 22:19   UKoreaRenal  Result Date: 01/27/2018 CLINICAL DATA:  62year old male with acute renal failure. EXAM: RENAL / URINARY TRACT ULTRASOUND COMPLETE COMPARISON:  CT of abdomen pelvis dated 01/14/2018 FINDINGS: Right Kidney: Length: 12.2 cm. Normal echogenicity. No hydronephrosis or shadowing stone. Left Kidney: Length: 11.7 cm. Normal echogenicity. No hydronephrosis or shadowing stone. Bladder: There is layering debris in the urinary bladder. Correlation with urinalysis recommended. The liver is slightly echogenic likely fatty infiltration. There is probable Riedel's lobe anatomy. IMPRESSION: 1. No hydronephrosis or shadowing stone. 2. Layering debris within the urinary bladder. Correlation with urinalysis recommended. Electronically Signed   By: AAnner CreteM.D.   On: 01/27/2018 22:38   Mr Foot Right W Wo Contrast  Result Date: 01/16/2018 CLINICAL DATA:  Necrotic gangrenous ulcer on the lateral aspect of the right foot with ascending cellulitis. EXAM: MRI OF THE RIGHT FOREFOOT WITHOUT AND WITH CONTRAST TECHNIQUE: Multiplanar, multisequence MR imaging of the right foot was  performed before and after the administration of intravenous contrast. CONTRAST:  10 cc Gadavist COMPARISON:  Radiographs 01/15/2018 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 metatarsal level. I do not see any marrow signal abnormality or enhancement to suggest osteomyelitis. No findings for septic arthritis. No subcutaneous soft tissue swelling/edema/fluid surrounding the foot  suggesting cellulitis but no discrete drainable soft tissue abscess or findings for pyomyositis. IMPRESSION: 1. Changes suggesting cellulitis but no discrete drainable soft tissue abscess. 2. No MR findings for septic arthritis, osteomyelitis or pyomyositis. Electronically Signed   By: Marijo Sanes M.D.   On: 01/16/2018 10:53   Dg Chest Port 1 View  Result Date: 01/31/2018 CLINICAL DATA:  Acute respiratory failure EXAM: PORTABLE CHEST 1 VIEW COMPARISON:  01/29/2018 FINDINGS: Cardiac shadow is stable. Postoperative changes are seen. Right jugular central line is noted in satisfactory position. The right-sided PICC line is also noted in stable position. The lungs are well aerated with minimal left basilar atelectasis. No pneumothorax is seen. No bony abnormality is noted. IMPRESSION: Mild left basilar atelectasis. Tubes and lines as described. Electronically Signed   By: Inez Catalina M.D.   On: 01/31/2018 08:12   Dg Chest Port 1 View  Result Date: 01/29/2018 CLINICAL DATA:  Severe shortness of breath. EXAM: PORTABLE CHEST 1 VIEW COMPARISON:  01/27/2018. FINDINGS: Stable enlarged cardiac silhouette, post CABG changes and prosthetic aortic valve. Interval right jugular catheter with its tip in the superior vena cava. No pneumothorax. Stable right PICC with its tip in the inferior aspect of the superior vena cava. Increased prominence of the interstitial markings, including Kerley lines with an interval small left pleural effusion. Stable left lung postsurgical changes. No acute bony abnormality. IMPRESSION:  Interval changes of congestive heart failure with stable cardiomegaly. Electronically Signed   By: Claudie Revering M.D.   On: 01/29/2018 16:24   Dg Chest Portable 1 View  Result Date: 01/27/2018 CLINICAL DATA:  Intermittent LEFT chest pain.  Oliguric. EXAM: PORTABLE CHEST 1 VIEW COMPARISON:  Chest radiograph January 15, 2018 FINDINGS: RIGHT PICC distal tip projects in distal superior vena cava. No pneumothorax. Stable mild cardiomegaly. Status post aortic valve replacement. LEFT mid lung zone bandlike density with mildly elevated LEFT hemidiaphragm. No focal consolidation. Blunting of the LEFT costophrenic angle. No pneumothorax. Soft tissue planes and included osseous structures are non suspicious. Surgical clips bilateral axilla. IMPRESSION: 1. RIGHT PICC distal tip projecting in distal superior vena cava. No pneumothorax. 2. LEFT lung base atelectasis with small pleural effusion versus pleural thickening. 3. Stable cardiomegaly. Electronically Signed   By: Elon Alas M.D.   On: 01/27/2018 18:43   Dg Chest Port 1 View  Result Date: 01/15/2018 CLINICAL DATA:  Severe sepsis. EXAM: PORTABLE CHEST 1 VIEW COMPARISON:  Radiograph 12/08/2014 FINDINGS: Stable cardiomegaly. Post CABG with prosthetic valve. No sternal wires visualized. No pulmonary edema. Scattered atelectasis. No confluent airspace disease, pleural effusion or pneumothorax. No acute osseous abnormalities. IMPRESSION: Chronic cardiomegaly with scattered atelectasis. Electronically Signed   By: Keith Rake M.D.   On: 01/15/2018 00:20   Dg Foot Complete Right  Result Date: 01/15/2018 CLINICAL DATA:  RIGHT foot infection, sepsis, prior amputation EXAM: RIGHT FOOT COMPLETE - 3+ VIEW COMPARISON:  04/06/2009 FINDINGS: Osseous demineralization. Prior transmetatarsal amputation. Joint spaces preserved. Mild dorsal spur formation at the talonavicular joint. No acute fracture, dislocation or bone destruction. Motion artifacts degrade lateral  view, no gross osseous abnormalities identified. Scattered soft tissue swelling without soft tissue gas. IMPRESSION: Transmetatarsal amputation RIGHT foot. No acute osseous findings. Electronically Signed   By: Lavonia Dana M.D.   On: 01/15/2018 00:57   Ct Renal Stone Study  Result Date: 01/29/2018 CLINICAL DATA:  anuria EXAM: CT ABDOMEN AND PELVIS WITHOUT CONTRAST TECHNIQUE: Multidetector CT imaging of the abdomen and pelvis was performed following the standard protocol without  oral or IV contrast. COMPARISON:  January 14, 2018 FINDINGS: Lower chest: There are pleural effusions bilaterally with consolidation in the posterior lung bases. There are foci of coronary artery calcification. There is a small hiatal hernia. Hepatobiliary: There is hepatic steatosis. No focal liver lesions are appreciable. Gallbladder is mildly distended. There are gallstones present. There may also be sludge in the gallbladder. There is no appreciable gallbladder wall thickening. There is no pericholecystic fluid. Pancreas: There is fatty infiltration in the pancreas. No focal pancreatic lesions are evident. Spleen: There is decreased attenuation in the posterior spleen in a wedge-shaped configuration consistent with splenic infarct, felt to be stable from most recent study. No new splenic lesions are evident on this noncontrast enhanced study. Adrenals/Urinary Tract: Adrenals bilaterally appear normal. There is no evident renal mass or hydronephrosis on either side. There is no appreciable renal or ureteral calculus on either side. There is a Foley catheter within the urinary bladder. There is wall thickening in portions of the urinary bladder anteriorly. Air within the urinary bladder may well be iatrogenic given recent catheterization. Stomach/Bowel: There is no appreciable bowel wall or mesenteric thickening. No evident bowel obstruction. There is no appreciable free air or portal venous air. Vascular/Lymphatic: There is aortic  atherosclerosis. There is also atherosclerotic calcification in the common iliac arteries. No aneurysm evident. Major mesenteric arterial vessels are felt to be patent, although there are foci of atherosclerotic calcification in mesenteric vessels. There is no appreciable adenopathy in the abdomen or pelvis. Reproductive: Prostate and seminal vesicles are normal in size and contour. There is calcification in the seminal vesicles. No pelvic masses evident. Other: There is ascites in the abdomen and pelvis with areas of apparent loculation of ascites in the pelvis. No abscess is evident in the abdomen or pelvis. Appendix appears unremarkable. There is mesenteric thickening in the left lower abdomen, near in area of loculated ascites. There is generalized anasarca. Musculoskeletal: Degenerative change throughout the lower thoracic and lumbar regions remain stable. There is loss of cortex with soft tissue thickening along the anterior, inferior aspect of T12 with loss of disc space in this area. There is also lucency along the posterior aspect of the L1 vertebral body, stable. A degree of discitis/osteomyelitis in this area must be of concern. No intramuscular lesions are evident. IMPRESSION: 1. Loss of cortex along the anterior inferior aspect of the T12 vertebral body as well as lucency in the posterior aspect of the L1 vertebral body. Question a degree of discitis/osteomyelitis in this region. This finding may warrant MR pre and post-contrast to further evaluate with respect to discitis/bony destruction. 2. Generalized anasarca. Areas of partially loculated ascites as well as mild mesenteric thickening in the left lower quadrant. No evident abscess in the abdomen or pelvis. 3. Cholelithiasis with probable sludge in the gallbladder as well. Gallbladder mildly distended. Gallbladder wall not thickened. 4. No evident hydronephrosis on either side. No renal or ureteral calculus on either side. 5. Urinary bladder  decompressed. Air within the urinary bladder likely is due to recent catheterization. Correlation with urinalysis to assess for possible gas-forming organism is a cause for air within the bladder is advised. 6.  No bowel obstruction.  Appendix region appears normal. 7.  Small hiatal hernia. 8.  Aortoiliac atherosclerosis.  Coronary artery calcification. 9.  Bilateral pleural effusions with bibasilar consolidation. 10. 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 Aortic Atherosclerosis (ICD10-I70.0). Electronically Signed   By: Lowella Grip III M.D.   On: 01/29/2018  07:08   [place] Ekg Site Rite  Result Date: 01/21/2018 If Site Rite image not attached, placement could not be confirmed due to current cardiac rhythm.   Lab Data:  CBC: Recent Labs  Lab 01/29/18 0346 01/29/18 1707 01/30/18 0604 01/31/18 0350 02/01/18 0419 02/02/18 0426  WBC 13.2* 13.1* 11.0* 9.6 9.3 10.9*  NEUTROABS 11.0*  --   --   --   --   --   HGB 10.8* 11.6* 11.2* 9.7* 9.1* 9.0*  HCT 32.8* 34.6* 34.6* 30.1* 28.6* 28.1*  MCV 86.1 85.4 86.5 87.2 89.1 89.2  PLT 132* 157 173 137* 149* 599   Basic Metabolic Panel: Recent Labs  Lab 01/29/18 1707 01/29/18 2000 01/30/18 0500 01/31/18 0350 02/01/18 0419 02/02/18 0426  NA 130* 132* 132* 131* 132* 130*  130*  K 5.1 4.3 4.4 4.3 3.9 4.4  4.4  CL 99 101 100 99 97* 98  98  CO2 13* 16* 16* 18* 22 21*  21*  GLUCOSE 65* 78 83 251* 263* 251*  251*  BUN 93* 74* 78* 63* 48* 62*  62*  CREATININE 10.14* 8.97* 9.38* 8.34* 6.91* 8.53*  8.50*  CALCIUM 6.3* 6.8* 6.7* 6.4* 7.4* 6.9*  6.9*  MG  --   --   --  2.1  --   --   PHOS 10.1*  --   --  8.5* 6.7* 7.1*   GFR: Estimated Creatinine Clearance: 6.7 mL/min (A) (by C-G formula based on SCr of 8.53 mg/dL (H)). Liver Function Tests: Recent Labs  Lab 01/27/18 1819 01/29/18 1707 02/01/18 0419 02/02/18 0426  AST 19  --   --   --   ALT <5  --   --   --   ALKPHOS 66  --   --   --   BILITOT 0.5  --    --   --   PROT 5.8*  --   --   --   ALBUMIN 2.2* 2.1* 1.9* 1.9*   No results for input(s): LIPASE, AMYLASE in the last 168 hours. No results for input(s): AMMONIA in the last 168 hours. Coagulation Profile: No results for input(s): INR, PROTIME in the last 168 hours. Cardiac Enzymes: Recent Labs  Lab 01/30/18 0500  CKTOTAL 33*   BNP (last 3 results) No results for input(s): PROBNP in the last 8760 hours. HbA1C: No results for input(s): HGBA1C in the last 72 hours. CBG: Recent Labs  Lab 02/01/18 1142 02/01/18 1624 02/01/18 2205 02/02/18 0800 02/02/18 1137  GLUCAP 284* 319* 313* 208* 256*   Lipid Profile: No results for input(s): CHOL, HDL, LDLCALC, TRIG, CHOLHDL, LDLDIRECT in the last 72 hours. Thyroid Function Tests: No results for input(s): TSH, T4TOTAL, FREET4, T3FREE, THYROIDAB in the last 72 hours. Anemia Panel: No results for input(s): VITAMINB12, FOLATE, FERRITIN, TIBC, IRON, RETICCTPCT in the last 72 hours. Urine analysis:    Component Value Date/Time   COLORURINE AMBER (A) 01/27/2018 1034   APPEARANCEUR TURBID (A) 01/27/2018 1034   LABSPEC 1.017 01/27/2018 1034   PHURINE 6.0 01/27/2018 1034   GLUCOSEU NEGATIVE 01/27/2018 1034   HGBUR MODERATE (A) 01/27/2018 1034   BILIRUBINUR NEGATIVE 01/27/2018 1034   KETONESUR NEGATIVE 01/27/2018 1034   PROTEINUR >=300 (A) 01/27/2018 1034   UROBILINOGEN 0.2 07/28/2014 0433   NITRITE NEGATIVE 01/27/2018 1034   LEUKOCYTESUR MODERATE (A) 01/27/2018 1034     Reyana Leisey M.D. [facility] 02/02/2018, 1:34 PM  Pager: [phone] Between 7am to 7pm - call Pager - [phone]  After 7pm  go to [email or web address] - password [email or web address]  Call night coverage person covering after 7pm

## 2018-02-02 NOTE — Progress Notes (Signed)
[facility] ROUNDING NOTE   Subjective:   Urine output  325 cc of urine made 01/31/2018.  However none recorded 02/01/2018 on 02/02/2018.  Vision appears to be a little better not short of breath states edema has improved after #3 treatments of dialysis and removal of close to 10 L  Blood pressure 140/74 pulse 92 temperature 99 O2 sats 99% 2 L nasal cannula  Sodium 130 potassium 4.4 chloride 98 CO2 21 glucose 251 BUN 62 creatinine 8.5 calcium is 6.9 Albumin 1.9 phosphorus 7.1 WBC 10.9 hemoglobin 9.0 platelets 151  Chest x-ray 01/31/2018 mild atelectasis     Objective:  Vital signs in last 24 hours:  Temp:  [98.2 F (36.8 C)-99 F (37.2 C)] 99 F (37.2 C) (09/29 0803) Pulse Rate:  [92-101] 92 (09/29 0803) Resp:  [16-18] 18 (09/29 0803) BP: (131-142)/(64-74) 140/74 (09/29 0803) SpO2:  [98 %-99 %] 99 % (09/29 0803) Weight:  [52.4 kg] 52.4 kg (09/29 0455)  Weight change: 0 kg Filed Weights   01/31/18 0446 01/31/18 2027 02/02/18 0455  Weight: 94.2 kg 52.4 kg 52.4 kg    Intake/Output: I/O last 3 completed shifts: In: 1100 [P.O.:1100] Out: 0    Intake/Output this shift:  No intake/output data recorded.  CVS- RRR systolic murmur RS-  decreased breath sounds at base ABD-distended abdomen EXT-generalized edema   Basic Metabolic Panel: Recent Labs  Lab 01/29/18 1707 01/29/18 2000 01/30/18 0500 01/31/18 0350 02/01/18 0419 02/02/18 0426  NA 130* 132* 132* 131* 132* 130*  130*  K 5.1 4.3 4.4 4.3 3.9 4.4  4.4  CL 99 101 100 99 97* 98  98  CO2 13* 16* 16* 18* 22 21*  21*  GLUCOSE 65* 78 83 251* 263* 251*  251*  BUN 93* 74* 78* 63* 48* 62*  62*  CREATININE 10.14* 8.97* 9.38* 8.34* 6.91* 8.53*  8.50*  CALCIUM 6.3* 6.8* 6.7* 6.4* 7.4* 6.9*  6.9*  MG  --   --   --  2.1  --   --   PHOS 10.1*  --   --  8.5* 6.7* 7.1*    Liver Function Tests: Recent Labs  Lab 01/27/18 1819 01/29/18 1707 02/01/18 0419 02/02/18 0426  AST 19  --   --   --   ALT <5   --   --   --   ALKPHOS 66  --   --   --   BILITOT 0.5  --   --   --   PROT 5.8*  --   --   --   ALBUMIN 2.2* 2.1* 1.9* 1.9*   No results for input(s): LIPASE, AMYLASE in the last 168 hours. No results for input(s): AMMONIA in the last 168 hours.  CBC: Recent Labs  Lab 01/29/18 0346 01/29/18 1707 01/30/18 0604 01/31/18 0350 02/01/18 0419 02/02/18 0426  WBC 13.2* 13.1* 11.0* 9.6 9.3 10.9*  NEUTROABS 11.0*  --   --   --   --   --   HGB 10.8* 11.6* 11.2* 9.7* 9.1* 9.0*  HCT 32.8* 34.6* 34.6* 30.1* 28.6* 28.1*  MCV 86.1 85.4 86.5 87.2 89.1 89.2  PLT 132* 157 173 137* 149* 151    Cardiac Enzymes: Recent Labs  Lab 01/30/18 0500  CKTOTAL 33*    BNP: Invalid input(s): POCBNP  CBG: Recent Labs  Lab 02/01/18 0806 02/01/18 1142 02/01/18 1624 02/01/18 2205 02/02/18 0800  GLUCAP 257* 284* 319* 313* 208*    Microbiology: Results for orders placed or performed during  the hospital encounter of 01/27/18  Urine culture     Status: None   Collection Time: 01/28/18  4:29 PM  Result Value Ref Range Status   Specimen Description URINE, CATHETERIZED  Final   Special Requests NONE  Final   Culture   Final    NO GROWTH Performed at [facility], [address], [place], [place] [address]    Report Status 01/29/2018 FINAL  Final    Coagulation Studies: No results for input(s): LABPROT, INR in the last 72 hours.  Urinalysis: No results for input(s): COLORURINE, LABSPEC, PHURINE, GLUCOSEU, HGBUR, BILIRUBINUR, KETONESUR, PROTEINUR, UROBILINOGEN, NITRITE, LEUKOCYTESUR in the last 72 hours.  Invalid input(s): APPERANCEUR    Imaging: No results found.   Medications:   . DAPTOmycin (CUBICIN)  IV 700 mg (02/01/18 2220)   . Chlorhexidine Gluconate Cloth  6 each Topical Q0600  . fluconazole  50 mg Oral Daily  . Gerhardt's butt cream   Topical TID  . heparin  5,000 Units Subcutaneous Q8H  . insulin aspart  0-5 Units Subcutaneous QHS  . insulin aspart  0-9 Units  Subcutaneous TID WC  . insulin aspart  3 Units Subcutaneous TID WC  . insulin glargine  10 Units Subcutaneous QHS  . latanoprost  1 drop Both Eyes QHS  . mouth rinse  15 mL Mouth Rinse BID  . metoprolol tartrate  50 mg Oral BID  . nystatin   Topical BID  . pantoprazole  40 mg Oral Q1200  . polyethylene glycol  17 g Oral Daily  . senna-docusate  1 tablet Oral BID   acetaminophen **OR** acetaminophen, clonazepam, ondansetron **OR** ondansetron (ZOFRAN) IV, sodium chloride flush  Assessment/ Plan:     Acute renal failure Baseline renal function with normal range 1.09 01/17/2018.  Puzzling etiology is not appear to have any admission of nephrotoxic agents CT scan abdomen and pelvis reveals generalized anasarca.  Have discontinued IV fluids.  Have discontinued Ancef.  Urinalysis revealed a dirty appearing sediment with red cells white cells and protein.  I am wondering whether renal biopsy would be indicated in order to better identify the pathology of his acute renal failure.  We have held off on this so far it does appear that his urine output is improving.  We shall continue to monitor there is no indication for dialysis today and refill continue to follow creatinine and monitor for return of renal function   right IJ J catheter placed by critical care 01/29/2018.  It does not appear that there has been much return of urine output.  This is not encouraging  Metabolic acidosis should correct with dialysis  Oral candidiasis Diflucan ordered started 01/30/2018 to end 02/05/2018.  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

## 2018-02-02 NOTE — NC FL2 (Signed)
[place] LEVEL OF CARE SCREENING TOOL     IDENTIFICATION  Patient Name: [name] Birthdate: 01-22-1956 Sex: male Admission Date (Current Location): 01/27/2018  [facility] and [place] Number:  Herbalist and Address:  [facility] [place], [place] [address] [place], [place] [address]      Provider Number: [id number]  Attending Physician Name and Address:  Mendel Corning, MD  Relative Name and Phone Number:  Ovidio Hanger [phone]    Current Level of Care: Hospital Recommended Level of Care: [place] Prior Approval Number:    Date Approved/Denied:   PASRR Number: [id number]  Discharge Plan: SNF    Current Diagnoses: Patient Active Problem List   Diagnosis Date Noted  . Respiratory distress   . Acute renal failure (ARF) ([place]) 01/27/2018  . Hypoglycemia due to insulin 01/27/2018  . Hyponatremia 01/27/2018  . 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 S/P bilateral BKA (below knee amputation) ([place])   . Post-operative pain   . Benign essential HTN   . Diabetes mellitus type 2 in obese ([place])   . PVD (peripheral vascular disease) ([place])   . History of prosthetic mitral valve   . Right foot infection 01/15/2018  . Sepsis ([place]) 01/15/2018  . Prosthetic valve endocarditis ([place])   . Staphylococcus aureus bacteremia   . 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 Acute hematogenous osteomyelitis, right ankle and foot ([place])   . Splenic infarct 01/14/2018  . Renal insufficiency 01/14/2018  . Severe sepsis ([place]) 01/14/2018  . Dupuytren's disease of palm of both hands 03/18/2017  . Cellulitis of left lower extremity 04/26/2016  . S/P BKA (below knee amputation) unilateral, right ([place]) 12/08/2014  . Bicuspid aortic valve   . Hyperlipidemia   . Hypertension   . Greater trochanter fracture ([place]) 07/28/2014  . Hip fracture, right ([place]) 07/28/2014  . HLD (hyperlipidemia) 07/28/2014  . GERD (gastroesophageal  reflux disease) 07/28/2014  . Hip fracture ([place]) 07/28/2014  . Fall 07/28/2014  . Leukocytosis 07/28/2014  . Thrombocytopenia ([place]) 07/28/2014  . H/O aortic valve replacement 10/16/2011  . 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 PLEURAL EFFUSION 05/25/2009  . OPEN WOUND OF CHEST , COMPLICATED 25/95/6387  . PROSTHETIC VALVE - BIO/ ENDOPROSTHESIS 05/25/2009  . Insulin-requiring or dependent type II diabetes mellitus ([place]) 04/19/2009  . HYPERLIPIDEMIA 04/19/2009  . Essential hypertension 04/19/2009  . CAD (coronary artery disease) 04/19/2009  . Peripheral vascular disease ([place]) 04/19/2009  . OSTEOMYELITIS 04/19/2009  . BICUSPID AORTIC VALVE 04/19/2009    Orientation RESPIRATION BLADDER Height & Weight     Self, Time, Situation, Place  O2(Nasal Cannula 2L) Continent Weight: 115 lb 8.3 oz (52.4 kg) Height:  5\' 10"  (177.8 cm)  BEHAVIORAL SYMPTOMS/MOOD NEUROLOGICAL BOWEL NUTRITION STATUS      Continent    AMBULATORY STATUS COMMUNICATION OF NEEDS Skin   Extensive Assist Verbally Surgical wounds(Right leg)                       Personal Care Assistance Level of Assistance  Bathing, Feeding, Dressing Bathing Assistance: Maximum assistance Feeding assistance: Independent Dressing Assistance: Maximum assistance     Functional Limitations Info  Sight, Hearing, Speech Sight Info: Adequate Hearing Info: Adequate Speech Info: Adequate    SPECIAL CARE FACTORS FREQUENCY        PT Frequency: 5x OT Frequency: 5x            Contractures Contractures Info: Not present  Additional Factors Info  Code Status, Allergies Code Status Info: Full Code Allergies Info: Ace Inhibitors           Current Medications (02/02/2018):  This is the current hospital active medication list Current Facility-Administered Medications  Medication Dose Route Frequency Provider Last Rate Last Dose  . acetaminophen (TYLENOL) tablet 650 mg  650 mg Oral Q6H PRN Mannam, Praveen, MD   650 mg  at 01/28/18 0022   Or  . acetaminophen (TYLENOL) suppository 650 mg  650 mg Rectal Q6H PRN Mannam, Praveen, MD      . Chlorhexidine Gluconate Cloth 2 % PADS 6 each  6 each Topical Q0600 Marshell Garfinkel, MD   6 each at 02/02/18 0610  . clonazePAM (KLONOPIN) disintegrating tablet 0.25 mg  0.25 mg Oral BID PRN Mannam, Praveen, MD   0.25 mg at 02/01/18 0015  . DAPTOmycin (CUBICIN) 700 mg in sodium chloride 0.9 % IVPB  700 mg Intravenous Q48H Mannam, Praveen, MD 228 mL/hr at 02/01/18 2220 700 mg at 02/01/18 2220  . fluconazole (DIFLUCAN) tablet 50 mg  50 mg Oral Daily Mannam, Praveen, MD   50 mg at 02/01/18 1034  . Gerhardt's butt cream   Topical TID Mannam, Praveen, MD      . heparin injection 5,000 Units  5,000 Units Subcutaneous Q8H Mannam, Praveen, MD   5,000 Units at 02/02/18 0610  . insulin aspart (novoLOG) injection 0-5 Units  0-5 Units Subcutaneous QHS Mannam, Praveen, MD   4 Units at 02/01/18 2235  . insulin aspart (novoLOG) injection 0-9 Units  0-9 Units Subcutaneous TID WC Mannam, Praveen, MD   7 Units at 02/01/18 1728  . insulin aspart (novoLOG) injection 3 Units  3 Units Subcutaneous TID WC Rai, Ripudeep K, MD   3 Units at 02/01/18 1728  . insulin glargine (LANTUS) injection 10 Units  10 Units Subcutaneous QHS Rai, Ripudeep K, MD   10 Units at 02/01/18 2226  . latanoprost (XALATAN) 0.005 % ophthalmic solution 1 drop  1 drop Both Eyes QHS Mannam, Praveen, MD   1 drop at 02/01/18 2225  . MEDLINE mouth rinse  15 mL Mouth Rinse BID Mannam, Praveen, MD   15 mL at 02/01/18 1032  . metoprolol tartrate (LOPRESSOR) tablet 50 mg  50 mg Oral BID Mannam, Praveen, MD   50 mg at 02/01/18 2225  . nystatin (MYCOSTATIN/NYSTOP) topical powder   Topical BID Mannam, Praveen, MD      . ondansetron (ZOFRAN) tablet 4 mg  4 mg Oral Q6H PRN Mannam, Praveen, MD       Or  . ondansetron (ZOFRAN) injection 4 mg  4 mg Intravenous Q6H PRN Mannam, Praveen, MD      . pantoprazole (PROTONIX) EC tablet 40 mg  40 mg Oral  Q1200 Mannam, Praveen, MD   40 mg at 02/01/18 1148  . polyethylene glycol (MIRALAX / GLYCOLAX) packet 17 g  17 g Oral Daily Mannam, Praveen, MD   17 g at 01/29/18 0916  . senna-docusate (Senokot-S) tablet 1 tablet  1 tablet Oral BID Mannam, Praveen, MD   1 tablet at 02/01/18 1033  . sodium chloride flush (NS) 0.9 % injection 10-40 mL  10-40 mL Intracatheter PRN Mannam, Praveen, MD   20 mL at 02/01/18 0427     Discharge Medications: Please see discharge summary for a list of discharge medications.  Relevant Imaging Results:  Relevant Lab Results:   Additional Information SSN # [id number]  Eileen Stanford, LCSW

## 2018-02-03 ENCOUNTER — Inpatient Hospital Stay (HOSPITAL_COMMUNITY): Payer: Medicare Other

## 2018-02-03 DIAGNOSIS — R1030 Lower abdominal pain, unspecified: Secondary | ICD-10-CM

## 2018-02-03 DIAGNOSIS — R0601 Orthopnea: Secondary | ICD-10-CM

## 2018-02-03 LAB — RENAL FUNCTION PANEL
Albumin: 1.9 g/dL — ABNORMAL LOW (ref 3.5–5.0)
Anion gap: 14 (ref 5–15)
BUN: 74 mg/dL — ABNORMAL HIGH (ref 8–23)
CHLORIDE: 96 mmol/L — AB (ref 98–111)
CO2: 20 mmol/L — AB (ref 22–32)
CREATININE: 9.77 mg/dL — AB (ref 0.61–1.24)
Calcium: 6.7 mg/dL — ABNORMAL LOW (ref 8.9–10.3)
GFR calc Af Amer: 6 mL/min — ABNORMAL LOW (ref >60)
GFR, EST NON AFRICAN AMERICAN: 5 mL/min — AB (ref >60)
Glucose, Bld: 198 mg/dL — ABNORMAL HIGH (ref 70–99)
Phosphorus: 7.2 mg/dL — ABNORMAL HIGH (ref 2.5–4.6)
Potassium: 4.8 mmol/L (ref 3.5–5.1)
Sodium: 130 mmol/L — ABNORMAL LOW (ref 135–145)

## 2018-02-03 LAB — GLUCOSE, CAPILLARY
GLUCOSE-CAPILLARY: 160 mg/dL — AB (ref 70–99)
GLUCOSE-CAPILLARY: 134 mg/dL — AB (ref 70–99)
GLUCOSE-CAPILLARY: 133 mg/dL — AB (ref 70–99)
Glucose-Capillary: 229 mg/dL — ABNORMAL HIGH (ref 70–99)

## 2018-02-03 MED ORDER — HEPARIN SODIUM (PORCINE) 1000 UNIT/ML IJ SOLN
INTRAMUSCULAR | Status: AC
  Administered 2018-02-03: 2400 [IU]
  Filled 2018-02-03: qty 3

## 2018-02-03 MED ORDER — INSULIN ASPART 100 UNIT/ML ~~LOC~~ SOLN
5.0000 [IU] | Freq: Three times a day (TID) | SUBCUTANEOUS | Status: DC
  Administered 2018-02-03 – 2018-02-04 (×2): 5 [IU] via SUBCUTANEOUS

## 2018-02-03 MED ORDER — METOPROLOL TARTRATE 5 MG/5ML IV SOLN
2.5000 mg | Freq: Once | INTRAVENOUS | Status: AC
  Administered 2018-02-03: 2.5 mg via INTRAVENOUS
  Filled 2018-02-03: qty 5

## 2018-02-03 MED ORDER — INSULIN GLARGINE 100 UNIT/ML ~~LOC~~ SOLN
18.0000 [IU] | Freq: Every day | SUBCUTANEOUS | Status: DC
  Administered 2018-02-03: 18 [IU] via SUBCUTANEOUS
  Filled 2018-02-03: qty 0.18

## 2018-02-03 MED ORDER — SODIUM CHLORIDE 0.9 % IV BOLUS
250.0000 mL | Freq: Once | INTRAVENOUS | Status: AC
  Administered 2018-02-03: 250 mL via INTRAVENOUS

## 2018-02-03 MED ORDER — CHLORHEXIDINE GLUCONATE CLOTH 2 % EX PADS: 6.0000 | MEDICATED_PAD | Freq: Every day | CUTANEOUS | Status: DC

## 2018-02-03 NOTE — Progress Notes (Signed)
[facility]                                                                              Patient Demographics  [name], is a 62 y.o. male, DOB - [birth year], DPO:[id number]  Admit date - 01/27/2018   Admitting Physician Vianne Bulls, MD  Outpatient Primary MD for the patient is [name], MD  Outpatient specialists:   LOS - 7  days   Medical records reviewed and are as summarized below:    Chief Complaint  Patient presents with  . 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  He reports an uneventful course at the nursing home until his urine output dropped off over the past couple days, noting that he has urinated very little in the past 2 to 3 days.  He has developed some mild suprapubic discomfort, but denies dysuria or flank pain and denies any fevers or chills.  He was evaluated with basic blood work at the nursing facility and found to have creatinine markedly elevated.  Creatinine 9.6 at the time of admission, 1.1 on 9/18 Patient was transferred to ICU on 9/25 due to respiratory distress, not responding to Lasix.  He was placed on BiPAP and HD cath was placed by CCM, hemodialysis started emergently. Patient transferred to the floor, [facility] hospitalist assumed care back on 9/27  Assessment & Plan    Principal Problem: Acute kidney injury: Presenting with oliguria to anuria in the last 2 to 3 days prior to admission -Baseline creatinine 1.1, unclear etiology, renal ultrasound did  not reveal any obstruction. Possible acute interstitial nephritis, medications, ?  Ancef -Patient was initially placed on IV fluid hydration however discontinued after developing shortness of breath.  He was placed on high-dose Lasix with no significant improvement.  Patient was transferred to ICU on 9/25 due to respiratory distress not responding to Lasix. -Temporary HD cath was placed by CCM, patient started on hemodialysis. -Patient feeling short of breath, increased work of breathing, lower abdominal pain, likely will need HD today  Active Problems: Acute respiratory failure with hypoxia -Likely due to volume overload/pulmonary edema from ESRD -Off BiPAP now, started on urgent hemodialysis on 9/25.  Patient received back-to-back HD, holding on the weekend. -increased work of breathing today, likely will need hemodialysis  Previous MSSA bacteremia with splenic infarct, tricuspid valve endocarditis -ID following, Ancef discontinued.   -Patient started on IV daptomycin.  Per ID, continue daptomycin 24 more days from 9/27  Oral thrush, diffuse erythema in the intertriginous areas -Continue Diflucan for 7 days    Insulin-requiring or dependent type II diabetes mellitus ([place]), uncontrolled with hypoglycemia -Hemoglobin A1c 8.5 -Patient has been hypoglycemic this  admission, hence continued on sliding scale insulin. -Prior to admission, patient was on Trulicity, glipizide, sliding scale insulin, Toujeo 30 units daily -With starting hemodialysis, will need to decrease insulin upon dc. -CBG still elevated in 200s, increase Lantus to 18 units at bedtime, increase meal coverage to 5 units 3 times daily AC, sliding scale insulin    Essential hypertension,  CAD (coronary artery disease) -Change aspirin, beta-blocker, holding statin due to daptomycin -Continue to hold ACE inhibitor     S/P BKA (below knee amputation) unilateral, right (HCC) -Appreciate orthopedics following, Dr. Sharol Given, currently  no cellulitis or drainage or wound dehiscence -Recommended removal of staples in 1 to 2 weeks, follow-up in office     Hyponatremia -Plateaued at 130  Lower abdominal pain Possibly due to uremia, increased work of breathing.  Abdominal x-ray with moderate stool burden, continue MiraLAX, Senokot S.    Code Status: Full CODE STATUS DVT Prophylaxis: Heparin subcu Family Communication: Discussed in detail with the patient, all imaging results, lab results explained to the patient    Disposition Plan: Patient needs to remain inpatient, monitoring creatinine function closely.  If needs hemodialysis long-term, will need vascular access and outpatient dialysis slot.  Time Spent in minutes 25   Procedures:  BiPAP Hemodialysis  Consultants:   Infectious disease Nephrology CCM  Antimicrobials:  Daptomycin 9/25>>  Medications  Scheduled Meds: . Chlorhexidine Gluconate Cloth  6 each Topical Q0600  . Chlorhexidine Gluconate Cloth  6 each Topical Q0600  . fluconazole  50 mg Oral Daily  . Gerhardt's butt cream   Topical TID  . heparin  5,000 Units Subcutaneous Q8H  . insulin aspart  0-5 Units Subcutaneous QHS  . insulin aspart  0-9 Units Subcutaneous TID WC  . insulin aspart  4 Units Subcutaneous TID WC  . insulin glargine  15 Units Subcutaneous QHS  . latanoprost  1 drop Both Eyes QHS  . mouth rinse  15 mL Mouth Rinse BID  . metoprolol tartrate  50 mg Oral BID  . nystatin   Topical BID  . pantoprazole  40 mg Oral Q1200  . polyethylene glycol  17 g Oral Daily  . senna-docusate  1 tablet Oral BID   Continuous Infusions: . DAPTOmycin (CUBICIN)  IV 700 mg (02/01/18 2220)   PRN Meds:.acetaminophen **OR** acetaminophen, clonazepam, ondansetron **OR** ondansetron (ZOFRAN) IV, sodium chloride flush   Antibiotics   Anti-infectives (From admission, onward)   Start     Dose/Rate Route Frequency Ordered Stop   01/30/18 2000  DAPTOmycin (CUBICIN) 700 mg in sodium chloride 0.9 % IVPB      700 mg 228 mL/hr over 30 Minutes Intravenous Every 48 hours 01/30/18 1306     01/30/18 1000  fluconazole (DIFLUCAN) tablet 50 mg     50 mg Oral Daily 01/29/18 0930 02/05/18 0959   01/29/18 2000  DAPTOmycin (CUBICIN) 700 mg in sodium chloride 0.9 % IVPB  Status:  Discontinued     700 mg 228 mL/hr over 30 Minutes Intravenous Every 48 hours 01/29/18 1013 01/30/18 1306   01/29/18 1030  fluconazole (DIFLUCAN) tablet 100 mg     100 mg Oral  Once 01/29/18 0930 01/29/18 1121   01/28/18 2200  ceFAZolin (ANCEF) IVPB 1 g/50 mL premix  Status:  Discontinued     1 g 100 mL/hr over 30 Minutes Intravenous Every 24 hours 01/28/18 1007 01/28/18 1231   01/27/18 2245  ceFAZolin (ANCEF) IVPB 2g/100 mL premix  Status:  Discontinued     2 g  200 mL/hr over 30 Minutes Intravenous Every 12 hours 01/27/18 2243 01/28/18 1007   01/27/18 2230  ceFAZolin (ANCEF) IVPB  Status:  Discontinued    Note to Pharmacy:  Indication:  MSSA bacteremia/endocarditis Last Day of Therapy:  03/08/18 Labs - Once weekly:  CBC/D and BMP, Labs - Every other week:  ESR and CRP     2 g Intravenous Every 8 hours 01/27/18 2220 01/27/18 2227   01/27/18 2230  ceFAZolin (ANCEF) IVPB 2g/100 mL premix  Status:  Discontinued     2 g 200 mL/hr over 30 Minutes Intravenous Every 8 hours 01/27/18 2227 01/27/18 2243        Subjective:   Reinhardt Chirco was seen and examined today.  Does not feel too good today, states feeling short of breath, lower abdominal pain, nausea.  No vomiting, chest pain, dizziness, new focal weakness   Objective:   Vitals:   02/02/18 2045 02/03/18 0500 02/03/18 0618 02/03/18 0739  BP: (!) 143/70  126/60 (!) 144/73  Pulse: 93  85 88  Resp: _0 Temp: 98.6 F (37 C)  99 F (37.2 C) 98.7 F (37.1 C)  TempSrc: Oral  Oral Oral  SpO2: 98%  99% 99%  Weight: 52.4 kg 52.4 kg    Height:        Intake/Output Summary (Last 24 hours) at 02/03/2018 1337 Last data filed at 02/03/2018 0920 Gross per 24 hour    Intake 630 ml  Output 25 ml  Net 605 ml     Wt Readings from Last 3 Encounters:  02/03/18 52.4 kg  01/23/18 95.2 kg  01/16/18 95.2 kg     Exam    General: Alert and oriented x 3, NAD  Eyes:   HEENT:  Atraumatic, normocephalic  Cardiovascular: S1 S2 auscultated, RRR, systolic murmur   Respiratory: Breath sound at the bases  Gastrointestinal: Soft, mild lower abdominal tenderness, distended, + bowel sounds  Ext: b/l BKA  Neuro:   Musculoskeletal: No digital cyanosis, clubbing  Skin: No rashes  Psych: Normal affect and demeanor, alert and oriented x3     Data Reviewed:  I have personally reviewed following labs and imaging studies  Micro Results Recent Results (from the past 240 hour(s))  Urine culture     Status: None   Collection Time: 01/28/18  4:29 PM  Result Value Ref Range Status   Specimen Description URINE, CATHETERIZED  Final   Special Requests NONE  Final   Culture   Final    NO GROWTH Performed at [facility], [address] [place], [place] [address]    Report Status 01/29/2018 FINAL  Final    Radiology Reports Dg Abd 1 View  Result Date: 01/28/2018 CLINICAL DATA:  Abdominal pain EXAM: ABDOMEN - 1 VIEW COMPARISON:  CT abdomen pelvis of 01/14/2018 FINDINGS: A supine film of the abdomen shows the lumbar spine to be somewhat rotated to the right. No bowel obstruction is seen. However there is paucity of bowel gas in the left abdomen of doubtful significance in view of the recent CT scan showing no mass. Calcified vas deferens are noted in the pelvis. IMPRESSION: 1. No bowel obstruction. 2. Paucity of bowel gas in the left abdomen of doubtful significance. 3. Calcified vas deferens. Electronically Signed   By: Ivar Drape M.D.   On: 01/28/2018 11:24   Ct Head Wo Contrast  Result Date: 01/14/2018 CLINICAL DATA:  Unexplained altered level consciousness. Slurred speech. Nausea. Frontal headache. Nausea  and vomiting. EXAM: CT HEAD WITHOUT  CONTRAST TECHNIQUE: Contiguous axial images were obtained from the base of the skull through the vertex without intravenous contrast. COMPARISON:  07/27/2014 FINDINGS: Brain: No evidence of acute infarction, hemorrhage, hydrocephalus, extra-axial collection, or mass lesion/mass effect. Mild generalized cerebral atrophy, without significant change. Vascular:  No hyperdense vessel or other acute findings. Skull: No evidence of fracture or other significant bone abnormality. Sinuses/Orbits:  No acute findings. Other: None. IMPRESSION: No acute intracranial abnormality.  Mild cerebral atrophy. Electronically Signed   By: Earle Gell M.D.   On: 01/14/2018 20:05   Ct Abdomen Pelvis W Contrast  Result Date: 01/14/2018 CLINICAL DATA:  Acute abdominal pain. EXAM: CT ABDOMEN AND PELVIS WITH CONTRAST TECHNIQUE: Multidetector CT imaging of the abdomen and pelvis was performed using the standard protocol following bolus administration of intravenous contrast. CONTRAST:  113m ISOVUE-300 IOPAMIDOL (ISOVUE-300) INJECTION 61% COMPARISON:  None. FINDINGS: Lower chest: Motion artifact through the lung bases. Mild dependent atelectasis on the left. Heart appears prominent size. There are coronary artery calcifications. Hepatobiliary: Decreased hepatic density consistent with steatosis. No discrete focal lesion. Hyperdensity at the gallbladder neck may be stones or sludge. No pericholecystic inflammation. No biliary dilatation. Pancreas: Fatty atrophy.  No ductal dilatation or inflammation. Spleen: Normal in size. Peripheral slightly wedge-shaped low-density in the posterior spleen measures approximately 4 cm in depth. No perisplenic fluid. Adrenals/Urinary Tract: Mild thickening left adrenal gland without dominant nodule. Normal right adrenal gland. No hydronephrosis. Symmetric bilateral perinephric edema. Homogeneous renal enhancement. Possible small exophytic cyst from the mid left kidney versus perinephric edema. Urinary  bladder is physiologically distended. No bladder wall thickening. Stomach/Bowel: Small hiatal hernia. Motion artifact and lack of enteric contrast limits detailed bowel assessment. Allowing for this, no evidence of obstruction, inflammation or bowel wall thickening. Normal appendix. Vascular/Lymphatic: Moderate aorto bi-iliac atherosclerosis without aneurysm. Portal vein and mesenteric vessels are patent. No enlarged abdominal or pelvic lymph nodes. Reproductive: Prostate is unremarkable. Other: No free air or free fluid. Musculoskeletal: Scoliosis and degenerative change throughout the lumbar spine. Slight flattening of the left femoral head may be chronic. No acute osseous abnormality, particularly, no left rib fractures. IMPRESSION: 1. 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 No perisplenic fluid. 2. Symmetric bilateral perinephric edema, likely chronic but can be seen in the setting of urinary tract infection. 3. Gallstones or sludge without gallbladder inflammation on CT. 4. Incidental hepatic steatosis and Aortic Atherosclerosis (ICD10-I70.0). Electronically Signed   By: MKeith RakeM.D.   On: 01/14/2018 22:19   [place]oreaRenal  Result Date: 01/27/2018 CLINICAL DATA:  62year old male with acute renal failure. EXAM: RENAL / URINARY TRACT ULTRASOUND COMPLETE COMPARISON:  CT of abdomen pelvis dated 01/14/2018 FINDINGS: Right Kidney: Length: 12.2 cm. Normal echogenicity. No hydronephrosis or shadowing stone. Left Kidney: Length: 11.7 cm. Normal echogenicity. No hydronephrosis or shadowing stone. Bladder: There is layering debris in the urinary bladder. Correlation with urinalysis recommended. The liver is slightly echogenic likely fatty infiltration. There is probable Riedel's lobe anatomy. IMPRESSION: 1. No hydronephrosis or shadowing stone. 2. Layering debris within the urinary bladder. Correlation with urinalysis recommended. Electronically Signed   By: AAnner Crete M.D.   On: 01/27/2018 22:38   Mr Foot Right W Wo Contrast  Result Date: 01/16/2018 CLINICAL DATA:  Necrotic gangrenous ulcer on the lateral aspect of the right foot with ascending cellulitis. EXAM: MRI OF THE RIGHT FOREFOOT WITHOUT AND WITH CONTRAST TECHNIQUE: Multiplanar, multisequence MR imaging of the right foot  was performed before and after the administration of intravenous contrast. CONTRAST:  10 cc Gadavist COMPARISON:  Radiographs 01/15/2018 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 metatarsal level. I do not see any marrow signal abnormality or enhancement to suggest osteomyelitis. No findings for septic arthritis. No subcutaneous soft tissue swelling/edema/fluid surrounding the foot suggesting cellulitis but no discrete drainable soft tissue abscess or findings for pyomyositis. IMPRESSION: 1. Changes suggesting cellulitis but no discrete drainable soft tissue abscess. 2. No MR findings for septic arthritis, osteomyelitis or pyomyositis. Electronically Signed   By: Marijo Sanes M.D.   On: 01/16/2018 10:53   Dg Chest Port 1 View  Result Date: 01/31/2018 CLINICAL DATA:  Acute respiratory failure EXAM: PORTABLE CHEST 1 VIEW COMPARISON:  01/29/2018 FINDINGS: Cardiac shadow is stable. Postoperative changes are seen. Right jugular central line is noted in satisfactory position. The right-sided PICC line is also noted in stable position. The lungs are well aerated with minimal left basilar atelectasis. No pneumothorax is seen. No bony abnormality is noted. IMPRESSION: Mild left basilar atelectasis. Tubes and lines as described. Electronically Signed   By: Inez Catalina M.D.   On: 01/31/2018 08:12   Dg Chest Port 1 View  Result Date: 01/29/2018 CLINICAL DATA:  Severe shortness of breath. EXAM: PORTABLE CHEST 1 VIEW COMPARISON:  01/27/2018. FINDINGS: Stable enlarged cardiac silhouette, post CABG changes and prosthetic aortic valve. Interval right jugular catheter with its  tip in the superior vena cava. No pneumothorax. Stable right PICC with its tip in the inferior aspect of the superior vena cava. Increased prominence of the interstitial markings, including Kerley lines with an interval small left pleural effusion. Stable left lung postsurgical changes. No acute bony abnormality. IMPRESSION: Interval changes of congestive heart failure with stable cardiomegaly. Electronically Signed   By: Claudie Revering M.D.   On: 01/29/2018 16:24   Dg Chest Portable 1 View  Result Date: 01/27/2018 CLINICAL DATA:  Intermittent LEFT chest pain.  Oliguric. EXAM: PORTABLE CHEST 1 VIEW COMPARISON:  Chest radiograph January 15, 2018 FINDINGS: RIGHT PICC distal tip projects in distal superior vena cava. No pneumothorax. Stable mild cardiomegaly. Status post aortic valve replacement. LEFT mid lung zone bandlike density with mildly elevated LEFT hemidiaphragm. No focal consolidation. Blunting of the LEFT costophrenic angle. No pneumothorax. Soft tissue planes and included osseous structures are non suspicious. Surgical clips bilateral axilla. IMPRESSION: 1. RIGHT PICC distal tip projecting in distal superior vena cava. No pneumothorax. 2. LEFT lung base atelectasis with small pleural effusion versus pleural thickening. 3. Stable cardiomegaly. Electronically Signed   By: Elon Alas M.D.   On: 01/27/2018 18:43   Dg Chest Port 1 View  Result Date: 01/15/2018 CLINICAL DATA:  Severe sepsis. EXAM: PORTABLE CHEST 1 VIEW COMPARISON:  Radiograph 12/08/2014 FINDINGS: Stable cardiomegaly. Post CABG with prosthetic valve. No sternal wires visualized. No pulmonary edema. Scattered atelectasis. No confluent airspace disease, pleural effusion or pneumothorax. No acute osseous abnormalities. IMPRESSION: Chronic cardiomegaly with scattered atelectasis. Electronically Signed   By: Keith Rake M.D.   On: 01/15/2018 00:20   Dg Abd 2 Views  Result Date: 02/03/2018 CLINICAL DATA:  Abdominal pain and  distension EXAM: ABDOMEN - 2 VIEW COMPARISON:  January 28, 2018 FINDINGS: Supine and left lateral decubitus images were obtained. There is moderate stool throughout colon. There is no appreciable bowel dilatation or air-fluid levels to suggest bowel obstruction. No evident free air. There are vascular calcifications in the pelvis. Visualized lung bases are clear. IMPRESSION: Moderate  stool in colon. No evident bowel obstruction or free air. Visualized lung bases clear. Electronically Signed   By: Lowella Grip III M.D.   On: 02/03/2018 10:06   Dg Foot Complete Right  Result Date: 01/15/2018 CLINICAL DATA:  RIGHT foot infection, sepsis, prior amputation EXAM: RIGHT FOOT COMPLETE - 3+ VIEW COMPARISON:  04/06/2009 FINDINGS: Osseous demineralization. Prior transmetatarsal amputation. Joint spaces preserved. Mild dorsal spur formation at the talonavicular joint. No acute fracture, dislocation or bone destruction. Motion artifacts degrade lateral view, no gross osseous abnormalities identified. Scattered soft tissue swelling without soft tissue gas. IMPRESSION: Transmetatarsal amputation RIGHT foot. No acute osseous findings. Electronically Signed   By: Lavonia Dana M.D.   On: 01/15/2018 00:57   Ct Renal Stone Study  Result Date: 01/29/2018 CLINICAL DATA:  anuria EXAM: CT ABDOMEN AND PELVIS WITHOUT CONTRAST TECHNIQUE: Multidetector CT imaging of the abdomen and pelvis was performed following the standard protocol without oral or IV contrast. COMPARISON:  January 14, 2018 FINDINGS: Lower chest: There are pleural effusions bilaterally with consolidation in the posterior lung bases. There are foci of coronary artery calcification. There is a small hiatal hernia. Hepatobiliary: There is hepatic steatosis. No focal liver lesions are appreciable. Gallbladder is mildly distended. There are gallstones present. There may also be sludge in the gallbladder. There is no appreciable gallbladder wall thickening. There is  no pericholecystic fluid. Pancreas: There is fatty infiltration in the pancreas. No focal pancreatic lesions are evident. Spleen: There is decreased attenuation in the posterior spleen in a wedge-shaped configuration consistent with splenic infarct, felt to be stable from most recent study. No new splenic lesions are evident on this noncontrast enhanced study. Adrenals/Urinary Tract: Adrenals bilaterally appear normal. There is no evident renal mass or hydronephrosis on either side. There is no appreciable renal or ureteral calculus on either side. There is a Foley catheter within the urinary bladder. There is wall thickening in portions of the urinary bladder anteriorly. Air within the urinary bladder may well be iatrogenic given recent catheterization. Stomach/Bowel: There is no appreciable bowel wall or mesenteric thickening. No evident bowel obstruction. There is no appreciable free air or portal venous air. Vascular/Lymphatic: There is aortic atherosclerosis. There is also atherosclerotic calcification in the common iliac arteries. No aneurysm evident. Major mesenteric arterial vessels are felt to be patent, although there are foci of atherosclerotic calcification in mesenteric vessels. There is no appreciable adenopathy in the abdomen or pelvis. Reproductive: Prostate and seminal vesicles are normal in size and contour. There is calcification in the seminal vesicles. No pelvic masses evident. Other: There is ascites in the abdomen and pelvis with areas of apparent loculation of ascites in the pelvis. No abscess is evident in the abdomen or pelvis. Appendix appears unremarkable. There is mesenteric thickening in the left lower abdomen, near in area of loculated ascites. There is generalized anasarca. Musculoskeletal: Degenerative change throughout the lower thoracic and lumbar regions remain stable. There is loss of cortex with soft tissue thickening along the anterior, inferior aspect of T12 with loss of disc  space in this area. There is also lucency along the posterior aspect of the L1 vertebral body, stable. A degree of discitis/osteomyelitis in this area must be of concern. No intramuscular lesions are evident. IMPRESSION: 1. Loss of cortex along the anterior inferior aspect of the T12 vertebral body as well as lucency in the posterior aspect of the L1 vertebral body. Question a degree of discitis/osteomyelitis in this region. This finding may warrant MR pre and  post-contrast to further evaluate with respect to discitis/bony destruction. 2. Generalized anasarca. Areas of partially loculated ascites as well as mild mesenteric thickening in the left lower quadrant. No evident abscess in the abdomen or pelvis. 3. Cholelithiasis with probable sludge in the gallbladder as well. Gallbladder mildly distended. Gallbladder wall not thickened. 4. No evident hydronephrosis on either side. No renal or ureteral calculus on either side. 5. Urinary bladder decompressed. Air within the urinary bladder likely is due to recent catheterization. Correlation with urinalysis to assess for possible gas-forming organism is a cause for air within the bladder is advised. 6.  No bowel obstruction.  Appendix region appears normal. 7.  Small hiatal hernia. 8.  Aortoiliac atherosclerosis.  Coronary artery calcification. 9.  Bilateral pleural effusions with bibasilar consolidation. 10. 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 Aortic Atherosclerosis (ICD10-I70.0). Electronically Signed   By: Lowella Grip III M.D.   On: 01/29/2018 07:08   [place] Ekg Site Rite  Result Date: 01/21/2018 If Site Rite image not attached, placement could not be confirmed due to current cardiac rhythm.   Lab Data:  CBC: Recent Labs  Lab 01/29/18 0346 01/29/18 1707 01/30/18 0604 01/31/18 0350 02/01/18 0419 02/02/18 0426  WBC 13.2* 13.1* 11.0* 9.6 9.3 10.9*  NEUTROABS 11.0*  --   --   --   --   --   HGB 10.8* 11.6* 11.2* 9.7* 9.1*  9.0*  HCT 32.8* 34.6* 34.6* 30.1* 28.6* 28.1*  MCV 86.1 85.4 86.5 87.2 89.1 89.2  PLT 132* 157 173 137* 149* 465   Basic Metabolic Panel: Recent Labs  Lab 01/29/18 1707  01/30/18 0500 01/31/18 0350 02/01/18 0419 02/02/18 0426 02/03/18 0309  NA 130*   < > 132* 131* 132* 130*  130* 130*  K 5.1   < > 4.4 4.3 3.9 4.4  4.4 4.8  CL 99   < > 100 99 97* 98  98 96*  CO2 13*   < > 16* 18* 22 21*  21* 20*  GLUCOSE 65*   < > 83 251* 263* 251*  251* 198*  BUN 93*   < > 78* 63* 48* 62*  62* 74*  CREATININE 10.14*   < > 9.38* 8.34* 6.91* 8.53*  8.50* 9.77*  CALCIUM 6.3*   < > 6.7* 6.4* 7.4* 6.9*  6.9* 6.7*  MG  --   --   --  2.1  --   --   --   PHOS 10.1*  --   --  8.5* 6.7* 7.1* 7.2*   < > = values in this interval not displayed.   GFR: Estimated Creatinine Clearance: 5.9 mL/min (A) (by C-G formula based on SCr of 9.77 mg/dL (H)). Liver Function Tests: Recent Labs  Lab 01/27/18 1819 01/29/18 1707 02/01/18 0419 02/02/18 0426 02/03/18 0309  AST 19  --   --   --   --   ALT <5  --   --   --   --   ALKPHOS 66  --   --   --   --   BILITOT 0.5  --   --   --   --   PROT 5.8*  --   --   --   --   ALBUMIN 2.2* 2.1* 1.9* 1.9* 1.9*   No results for input(s): LIPASE, AMYLASE in the last 168 hours. No results for input(s): AMMONIA in the last 168 hours. Coagulation Profile: No results for input(s): INR, PROTIME in the last 168  hours. Cardiac Enzymes: Recent Labs  Lab 01/30/18 0500  CKTOTAL 33*   BNP (last 3 results) No results for input(s): PROBNP in the last 8760 hours. HbA1C: No results for input(s): HGBA1C in the last 72 hours. CBG: Recent Labs  Lab 02/02/18 1137 02/02/18 1736 02/02/18 2045 02/03/18 0739 02/03/18 1157  GLUCAP 256* 240* 229* 160* 229*   Lipid Profile: No results for input(s): CHOL, HDL, LDLCALC, TRIG, CHOLHDL, LDLDIRECT in the last 72 hours. Thyroid Function Tests: No results for input(s): TSH, T4TOTAL, FREET4, T3FREE, THYROIDAB in the last 72  hours. Anemia Panel: No results for input(s): VITAMINB12, FOLATE, FERRITIN, TIBC, IRON, RETICCTPCT in the last 72 hours. Urine analysis:    Component Value Date/Time   COLORURINE AMBER (A) 01/27/2018 1034   APPEARANCEUR TURBID (A) 01/27/2018 1034   LABSPEC 1.017 01/27/2018 1034   PHURINE 6.0 01/27/2018 1034   GLUCOSEU NEGATIVE 01/27/2018 1034   HGBUR MODERATE (A) 01/27/2018 1034   BILIRUBINUR NEGATIVE 01/27/2018 1034   KETONESUR NEGATIVE 01/27/2018 1034   PROTEINUR >=300 (A) 01/27/2018 1034   UROBILINOGEN 0.2 07/28/2014 0433   NITRITE NEGATIVE 01/27/2018 1034   LEUKOCYTESUR MODERATE (A) 01/27/2018 1034     Ripudeep Rai M.D. [facility] 02/03/2018, 1:37 PM  Pager: [phone] Between 7am to 7pm - call Pager - [phone]  After 7pm go to [email or web address] - password [email or web address]  Call night coverage person covering after 7pm

## 2018-02-03 NOTE — Progress Notes (Signed)
[facility] ROUNDING NOTE   Subjective:   This AM c/o dyspnea, orthopnea.  On for HD.  UOP 18mL yesterday, none 2 days prior. KUB showed stool burden    Objective:  Vital signs in last 24 hours:  Temp:  [98.6 F (37 C)-99 F (37.2 C)] 98.7 F (37.1 C) (09/30 0739) Pulse Rate:  [85-93] 88 (09/30 0739) Resp:  [18-20] 18 (09/30 0739) BP: (126-146)/(60-73) 144/73 (09/30 0739) SpO2:  [98 %-99 %] 99 % (09/30 0739) Weight:  [52.4 kg] 52.4 kg (09/30 0500)  Weight change: 0 kg Filed Weights   02/02/18 0455 02/02/18 2045 02/03/18 0500  Weight: 52.4 kg 52.4 kg 52.4 kg    Intake/Output: I/O last 3 completed shifts: In: 17 [P.O.:920; I.V.:10] Out: 25 [Urine:25]   Intake/Output this shift:  Total I/O In: 200 [P.O.:200] Out: -   CVS- RRR systolic murmur RS-  decreased breath sounds at base ABD-distended abdomen EXT-generalized edema   Basic Metabolic Panel: Recent Labs  Lab 01/29/18 1707  01/30/18 0500 01/31/18 0350 02/01/18 0419 02/02/18 0426 02/03/18 0309  NA 130*   < > 132* 131* 132* 130*  130* 130*  K 5.1   < > 4.4 4.3 3.9 4.4  4.4 4.8  CL 99   < > 100 99 97* 98  98 96*  CO2 13*   < > 16* 18* 22 21*  21* 20*  GLUCOSE 65*   < > 83 251* 263* 251*  251* 198*  BUN 93*   < > 78* 63* 48* 62*  62* 74*  CREATININE 10.14*   < > 9.38* 8.34* 6.91* 8.53*  8.50* 9.77*  CALCIUM 6.3*   < > 6.7* 6.4* 7.4* 6.9*  6.9* 6.7*  MG  --   --   --  2.1  --   --   --   PHOS 10.1*  --   --  8.5* 6.7* 7.1* 7.2*   < > = values in this interval not displayed.    Liver Function Tests: Recent Labs  Lab 01/27/18 1819 01/29/18 1707 02/01/18 0419 02/02/18 0426 02/03/18 0309  AST 19  --   --   --   --   ALT <5  --   --   --   --   ALKPHOS 66  --   --   --   --   BILITOT 0.5  --   --   --   --   PROT 5.8*  --   --   --   --   ALBUMIN 2.2* 2.1* 1.9* 1.9* 1.9*   No results for input(s): LIPASE, AMYLASE in the last 168 hours. No results for input(s): AMMONIA in  the last 168 hours.  CBC: Recent Labs  Lab 01/29/18 0346 01/29/18 1707 01/30/18 0604 01/31/18 0350 02/01/18 0419 02/02/18 0426  WBC 13.2* 13.1* 11.0* 9.6 9.3 10.9*  NEUTROABS 11.0*  --   --   --   --   --   HGB 10.8* 11.6* 11.2* 9.7* 9.1* 9.0*  HCT 32.8* 34.6* 34.6* 30.1* 28.6* 28.1*  MCV 86.1 85.4 86.5 87.2 89.1 89.2  PLT 132* 157 173 137* 149* 151    Cardiac Enzymes: Recent Labs  Lab 01/30/18 0500  CKTOTAL 33*    BNP: Invalid input(s): POCBNP  CBG: Recent Labs  Lab 02/02/18 0800 02/02/18 1137 02/02/18 1736 02/02/18 2045 02/03/18 0739  GLUCAP 208* 256* 240* 229* 160*    Microbiology: Results for orders placed or performed during the hospital  encounter of 01/27/18  Urine culture     Status: None   Collection Time: 01/28/18  4:29 PM  Result Value Ref Range Status   Specimen Description URINE, CATHETERIZED  Final   Special Requests NONE  Final   Culture   Final    NO GROWTH Performed at [facility], [address], [place], [place] [address]    Report Status 01/29/2018 FINAL  Final    Coagulation Studies: No results for input(s): LABPROT, INR in the last 72 hours.  Urinalysis: No results for input(s): COLORURINE, LABSPEC, PHURINE, GLUCOSEU, HGBUR, BILIRUBINUR, KETONESUR, PROTEINUR, UROBILINOGEN, NITRITE, LEUKOCYTESUR in the last 72 hours.  Invalid input(s): APPERANCEUR    Imaging: Dg Abd 2 Views  Result Date: 02/03/2018 CLINICAL DATA:  Abdominal pain and distension EXAM: ABDOMEN - 2 VIEW COMPARISON:  January 28, 2018 FINDINGS: Supine and left lateral decubitus images were obtained. There is moderate stool throughout colon. There is no appreciable bowel dilatation or air-fluid levels to suggest bowel obstruction. No evident free air. There are vascular calcifications in the pelvis. Visualized lung bases are clear. IMPRESSION: Moderate stool in colon. No evident bowel obstruction or free air. Visualized lung bases clear. Electronically Signed    By: Lowella Grip III M.D.   On: 02/03/2018 10:06     Medications:   . DAPTOmycin (CUBICIN)  IV 700 mg (02/01/18 2220)   . Chlorhexidine Gluconate Cloth  6 each Topical Q0600  . Chlorhexidine Gluconate Cloth  6 each Topical Q0600  . fluconazole  50 mg Oral Daily  . Gerhardt's butt cream   Topical TID  . heparin  5,000 Units Subcutaneous Q8H  . insulin aspart  0-5 Units Subcutaneous QHS  . insulin aspart  0-9 Units Subcutaneous TID WC  . insulin aspart  4 Units Subcutaneous TID WC  . insulin glargine  15 Units Subcutaneous QHS  . latanoprost  1 drop Both Eyes QHS  . mouth rinse  15 mL Mouth Rinse BID  . metoprolol tartrate  50 mg Oral BID  . nystatin   Topical BID  . pantoprazole  40 mg Oral Q1200  . polyethylene glycol  17 g Oral Daily  . senna-docusate  1 tablet Oral BID   acetaminophen **OR** acetaminophen, clonazepam, ondansetron **OR** ondansetron (ZOFRAN) IV, sodium chloride flush  Assessment/ Plan:     AKI, severe, oliguric:  Cr 01/22/18 1.13 but 9/23 9.6.  He has no clear nephrotoxic events but working diagnosis is ATN versus AIN from ancef which has been discontinued.  Empiric hydration was not helpful. CT renal stone protocol unrevealing.  He initiated RRT for volume overload.  He remains oliguric.  I think it's very unlikely that this is a glomerulonephritis and a renal biopsy would likely be of limited utility.  If it did show AIN steroids would be contraindicated in the setting of his endocarditis anyway.  Continue to provide supportive care and await renal recovery which is still possible.  HD today for volume overload - hope for 3L UF.    Anemia:  Hb stable in the 9s for the past 3 days but decreased from priors 10-12 last week.  Likely AoCD  Metabolic acidosis improving with dialysis.  Oral candidiasis Diflucan ordered started 01/30/2018 to end 02/05/2018.  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

## 2018-02-03 NOTE — Progress Notes (Signed)
Patient returned from HD with a new onset of Afib in the 140's. HD RN reported that patient went into this rhythm during HD. V/S obtained and stable. Scheduled metoprolol 50 mg given. Schorr, NP notified. NP stated to complete a EKG and then page her when it is uploaded into Epic. Night Shift RN notified of this information.

## 2018-02-04 ENCOUNTER — Encounter (HOSPITAL_COMMUNITY): Payer: Self-pay | Admitting: Cardiology

## 2018-02-04 DIAGNOSIS — I2510 Atherosclerotic heart disease of native coronary artery without angina pectoris: Secondary | ICD-10-CM

## 2018-02-04 DIAGNOSIS — I368 Other nonrheumatic tricuspid valve disorders: Secondary | ICD-10-CM

## 2018-02-04 DIAGNOSIS — E119 Type 2 diabetes mellitus without complications: Secondary | ICD-10-CM

## 2018-02-04 DIAGNOSIS — N171 Acute kidney failure with acute cortical necrosis: Secondary | ICD-10-CM

## 2018-02-04 LAB — GLUCOSE, CAPILLARY
GLUCOSE-CAPILLARY: 223 mg/dL — AB (ref 70–99)
GLUCOSE-CAPILLARY: 333 mg/dL — AB (ref 70–99)
Glucose-Capillary: 227 mg/dL — ABNORMAL HIGH (ref 70–99)
Glucose-Capillary: 227 mg/dL — ABNORMAL HIGH (ref 70–99)
Glucose-Capillary: 267 mg/dL — ABNORMAL HIGH (ref 70–99)
Glucose-Capillary: 217 mg/dL — ABNORMAL HIGH (ref 70–99)

## 2018-02-04 LAB — RENAL FUNCTION PANEL
Albumin: 1.9 g/dL — ABNORMAL LOW (ref 3.5–5.0)
Anion gap: 10 (ref 5–15)
BUN: 43 mg/dL — ABNORMAL HIGH (ref 8–23)
CALCIUM: 6.6 mg/dL — AB (ref 8.9–10.3)
CO2: 23 mmol/L (ref 22–32)
Chloride: 97 mmol/L — ABNORMAL LOW (ref 98–111)
Creatinine, Ser: 6.88 mg/dL — ABNORMAL HIGH (ref 0.61–1.24)
GFR calc non Af Amer: 8 mL/min — ABNORMAL LOW (ref >60)
GFR, EST AFRICAN AMERICAN: 9 mL/min — AB (ref >60)
GLUCOSE: 229 mg/dL — AB (ref 70–99)
POTASSIUM: 4.3 mmol/L (ref 3.5–5.1)
Phosphorus: 5.8 mg/dL — ABNORMAL HIGH (ref 2.5–4.6)
SODIUM: 130 mmol/L — AB (ref 135–145)

## 2018-02-04 MED ORDER — INSULIN GLARGINE 100 UNIT/ML ~~LOC~~ SOLN
25.0000 [IU] | Freq: Every day | SUBCUTANEOUS | Status: DC
  Administered 2018-02-04: 25 [IU] via SUBCUTANEOUS
  Filled 2018-02-04 (×2): qty 0.25

## 2018-02-04 MED ORDER — WARFARIN - PHARMACIST DOSING INPATIENT
Freq: Every day | Status: DC
  Administered 2018-02-05 – 2018-02-09 (×3)

## 2018-02-04 MED ORDER — WARFARIN SODIUM 5 MG PO TABS
5.0000 mg | ORAL_TABLET | Freq: Once | ORAL | Status: AC
  Administered 2018-02-04: 5 mg via ORAL
  Filled 2018-02-04: qty 1

## 2018-02-04 MED ORDER — CHLORHEXIDINE GLUCONATE CLOTH 2 % EX PADS
6.0000 | MEDICATED_PAD | Freq: Every day | CUTANEOUS | Status: DC
  Administered 2018-02-08 – 2018-03-11 (×27): 6 via TOPICAL

## 2018-02-04 MED ORDER — INSULIN ASPART 100 UNIT/ML ~~LOC~~ SOLN
8.0000 [IU] | Freq: Three times a day (TID) | SUBCUTANEOUS | Status: DC
  Administered 2018-02-04 – 2018-02-05 (×3): 8 [IU] via SUBCUTANEOUS

## 2018-02-04 NOTE — Progress Notes (Signed)
[facility] ROUNDING NOTE   Subjective:   A fib with RVR developed during HD yesterday.  This morning NSR per tele.  He had UF  3L.  UOP 85mL/24hr. He is feeling improved this morning.  No orthopnea or dyspnea.     Objective:  Vital signs in last 24 hours:  Temp:  [98 F (36.7 C)-98.7 F (37.1 C)] 98.3 F (36.8 C) (10/01 0459) Pulse Rate:  [81-106] 91 (10/01 0754) Resp:  [12-20] 18 (10/01 0754) BP: (99-159)/(48-80) 131/64 (10/01 0754) SpO2:  [99 %-100 %] 100 % (10/01 0754) Weight:  [50.8 kg-54.1 kg] 53.1 kg (10/01 0913)  Weight change: 1.7 kg Filed Weights   02/03/18 1254 02/03/18 1645 02/04/18 0913  Weight: 54.1 kg 50.8 kg 53.1 kg    Intake/Output: I/O last 3 completed shifts: In: 062 [P.O.:800; I.V.:10; IV Piggyback:114] Out: 3076 [Urine:75; Other:3000; Stool:1]   Intake/Output this shift:  No intake/output data recorded.  CVS- RRR systolic murmur RS-  decreased breath sounds at base ABD-distended abdomen EXT-generalized edema   Basic Metabolic Panel: Recent Labs  Lab 01/31/18 0350 02/01/18 0419 02/02/18 0426 02/03/18 0309 02/04/18 0410  NA 131* 132* 130*  130* 130* 130*  K 4.3 3.9 4.4  4.4 4.8 4.3  CL 99 97* 98  98 96* 97*  CO2 18* 22 21*  21* 20* 23  GLUCOSE 251* 263* 251*  251* 198* 229*  BUN 63* 48* 62*  62* 74* 43*  CREATININE 8.34* 6.91* 8.53*  8.50* 9.77* 6.88*  CALCIUM 6.4* 7.4* 6.9*  6.9* 6.7* 6.6*  MG 2.1  --   --   --   --   PHOS 8.5* 6.7* 7.1* 7.2* 5.8*    Liver Function Tests: Recent Labs  Lab 01/29/18 1707 02/01/18 0419 02/02/18 0426 02/03/18 0309 02/04/18 0410  ALBUMIN 2.1* 1.9* 1.9* 1.9* 1.9*   No results for input(s): LIPASE, AMYLASE in the last 168 hours. No results for input(s): AMMONIA in the last 168 hours.  CBC: Recent Labs  Lab 01/29/18 0346 01/29/18 1707 01/30/18 0604 01/31/18 0350 02/01/18 0419 02/02/18 0426  WBC 13.2* 13.1* 11.0* 9.6 9.3 10.9*  NEUTROABS 11.0*  --   --   --   --    --   HGB 10.8* 11.6* 11.2* 9.7* 9.1* 9.0*  HCT 32.8* 34.6* 34.6* 30.1* 28.6* 28.1*  MCV 86.1 85.4 86.5 87.2 89.1 89.2  PLT 132* 157 173 137* 149* 151    Cardiac Enzymes: Recent Labs  Lab 01/30/18 0500  CKTOTAL 33*    BNP: Invalid input(s): POCBNP  CBG: Recent Labs  Lab 02/03/18 1726 02/03/18 2015 02/04/18 0136 02/04/18 0456 02/04/18 0753  GLUCAP 134* 133* 33* 59* 8*    Microbiology: Results for orders placed or performed during the hospital encounter of 01/27/18  Urine culture     Status: None   Collection Time: 01/28/18  4:29 PM  Result Value Ref Range Status   Specimen Description URINE, CATHETERIZED  Final   Special Requests NONE  Final   Culture   Final    NO GROWTH Performed at [facility], [place] [address], [place], [place] [address]    Report Status 01/29/2018 FINAL  Final    Coagulation Studies: No results for input(s): LABPROT, INR in the last 72 hours.  Urinalysis: No results for input(s): COLORURINE, LABSPEC, PHURINE, GLUCOSEU, HGBUR, BILIRUBINUR, KETONESUR, PROTEINUR, UROBILINOGEN, NITRITE, LEUKOCYTESUR in the last 72 hours.  Invalid input(s): APPERANCEUR    Imaging: Dg Abd 2 Views  Result  Date: 02/03/2018 CLINICAL DATA:  Abdominal pain and distension EXAM: ABDOMEN - 2 VIEW COMPARISON:  January 28, 2018 FINDINGS: Supine and left lateral decubitus images were obtained. There is moderate stool throughout colon. There is no appreciable bowel dilatation or air-fluid levels to suggest bowel obstruction. No evident free air. There are vascular calcifications in the pelvis. Visualized lung bases are clear. IMPRESSION: Moderate stool in colon. No evident bowel obstruction or free air. Visualized lung bases clear. Electronically Signed   By: Lowella Grip III M.D.   On: 02/03/2018 10:06     Medications:   . DAPTOmycin (CUBICIN)  IV 700 mg (02/03/18 2100)   . Chlorhexidine Gluconate Cloth  6 each Topical Q0600  . Chlorhexidine Gluconate  Cloth  6 each Topical Q0600  . Gerhardt's butt cream   Topical TID  . heparin  5,000 Units Subcutaneous Q8H  . insulin aspart  0-5 Units Subcutaneous QHS  . insulin aspart  0-9 Units Subcutaneous TID WC  . insulin aspart  5 Units Subcutaneous TID WC  . insulin glargine  18 Units Subcutaneous QHS  . latanoprost  1 drop Both Eyes QHS  . mouth rinse  15 mL Mouth Rinse BID  . metoprolol tartrate  50 mg Oral BID  . nystatin   Topical BID  . pantoprazole  40 mg Oral Q1200  . polyethylene glycol  17 g Oral Daily  . senna-docusate  1 tablet Oral BID   acetaminophen **OR** acetaminophen, clonazepam, ondansetron **OR** ondansetron (ZOFRAN) IV, sodium chloride flush  Assessment/ Plan:     AKI, severe, oliguric:  Cr 01/22/18 1.13 but 9/23 9.6.  He has no clear nephrotoxic events but working diagnosis is ATN versus AIN from ancef which has been discontinued.  Empiric hydration was not helpful. CT renal stone protocol unrevealing.  He initiated RRT for volume overload.  He remains oliguric.  I think it's very unlikely that this is a glomerulonephritis and a renal biopsy would likely be of limited utility.  If it did show AIN steroids would be contraindicated in the setting of his endocarditis anyway.  Continue to provide supportive care and await renal recovery which is still possible.  HD tomorrow but if UOP significantly improved will hold.   He has a temporary catheter which will need to be changed to tunneled if he fails to improve.   Atrial fibrillation with RVR:  Currently in NSR with normal BP.  On metoprolol 50 BID.  If recurs may need titration of BB or other therapy.  Would consider repeat TTE as well.  Defer anticoagulation to primary.   Anemia:  Hb stable in the 9s but decreased from priors 10-12 last week.  Likely AoCD  Metabolic acidosis Resolved with dialysis.  Oral candidiasis Diflucan ordered started 01/30/2018 to end 02/05/2018.  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

## 2018-02-04 NOTE — Progress Notes (Addendum)
Inpatient Diabetes Program Recommendations  AACE/ADA: New Consensus Statement on Inpatient Glycemic Control (2015)  Target Ranges:  Prepandial:   less than 140 mg/dL      Peak postprandial:   less than 180 mg/dL (1-2 hours)      Critically ill patients:  140 - 180 mg/dL   Lab Results  Component Value Date   GLUCAP 333 (H) 02/04/2018   HGBA1C 8.5 (H) 01/16/2018    Review of Glycemic ControlResults for JAVAUGHN, OPDAHL (MRN [id number]) as of 02/04/2018 12:06  Ref. Range 02/03/2018 20:15 02/04/2018 01:36 02/04/2018 04:56 02/04/2018 07:53 02/04/2018 11:20  Glucose-Capillary Latest Ref Range: 70 - 99 mg/dL 133 (H) 227 (H) 223 (H) 227 (H) 333 (H)   Diabetes history: Type 2 DM Outpatient Diabetes medications: Toujeo 30 units daily, Novolog 2-10 units tid with meals, Glucotrol XL mg daily, Trulicity 1.5 mg weekly Current orders for Inpatient glycemic control:  Novolog sensitive tid with meals and HS, Novolog 8 units tid with meals, Lantus 25 units q HS Inpatient Diabetes Program Recommendations:   Referral received.Note insulin adjustments today.  Patient was initially low on admit.  Blood sugars now trending up.  No recommendations today.  Will follow blood sugars and f/u with patient on 02/05/18.  Thanks,  Adah Perl, RN, BC-ADM Inpatient Diabetes Coordinator Pager [phone] (8a-5p)

## 2018-02-04 NOTE — Progress Notes (Signed)
Pharmacy Antibiotic Note  [name] is a 62 y.o. 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 Pharmacy has been consulted for daptomycin dosing.  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 CK wnl on 9/26 (next CK 10/3). Previous weights not accurate due to bed scale issue, patient weighed with alternate method and patient weighs ~96 kg  Plan: Continue daptomycin 700 mg (~8mg /kg AdjBW) IV q48h Monitor clinical picture, renal function, CK weekly  Height: 5\' 10"  (177.8 cm) Weight: 111 lb 15.9 oz (50.8 kg) IBW/kg (Calculated) : 73  Temp (24hrs), Avg:98.3 F (36.8 C), Min:98 F (36.7 C), Max:98.7 F (37.1 C)  Recent Labs  Lab 01/29/18 1707  01/30/18 0604 01/31/18 0350 02/01/18 0419 02/02/18 0426 02/03/18 0309 02/04/18 0410  WBC 13.1*  --  11.0* 9.6 9.3 10.9*  --   --   CREATININE 10.14*   < >  --  8.34* 6.91* 8.53*  8.50* 9.77* 6.88*   < > = values in this interval not displayed.    Estimated Creatinine Clearance: 8.1 mL/min (A) (by C-G formula based on SCr of 6.88 mg/dL (H)).    Allergies  Allergen Reactions  . Ace Inhibitors Cough   Louine Tenpenny A. Levada Dy, PharmD, [place] Pager: [phone] Please utilize Amion for appropriate phone number to reach the unit pharmacist ([place])    02/04/2018 9:05 AM

## 2018-02-04 NOTE — Consult Note (Addendum)
Cardiology Consultation:   Patient ID: [name] MRN: [id number]; DOB: 07/10/1955  Admit date: 01/27/2018 Date of Consult: 02/04/2018  Primary Care Provider: [name], MD Primary Cardiologist: Minus Breeding, MD  Primary Electrophysiologist:  None    Patient Profile:   [name] is a 62 y.o. male with a hx of CAD s/p CABG and AVR pericardial tissue valve, IDDM, HLD, HTN, PVD with amputation Lt BKA and Rt BKA, GERD and recent sepsis with MSSA bacteremia and TV endocarditis who is being seen today for the evaluation of atrial fib at the request of Dr. Tana Coast after admit 01/27/18 for acute renal failure.  History of Present Illness:   [name] with above hx and recent TEE 01/21/18 with EF 50-55%, no regionally wall motion abnormalities.  AV with normal range of motion, the sewing ring normal and no rocking motion, no evidence of dehiscence.  No thrombus, + vegetation on TV, + endocarditis.  On TTE with G2 DD, PA pk pressure 31 mmHg.   D/c'd on IV ancef per PICC line.   Prior CABG 2010 with LIMA to LAD, VG to Diag, VG to ramus intermediius of LAD and seq VG to RV marginal and PDA. Has done well since.      Lab at facility with Cr of 9.60 has been 1.13.  Sent to ER.  He was hypoglycemic.  Wbc at 18,600.  Given IV fluids in ER.  Nephrology is following and pt has had HD.  Post HD yesterday he developed a fib with RVR.  Given po metoprolol.  CHA2DS2VASc of 3. Now on metoprolol 50 bid   He did develop respiratory distress after admit not responding to lasix, placed on BiPAP and emergent HD 01/29/18.    EKG on admit with SR LVH and IVCD I personally reviewed.  EKG 02/03/18 with A fib RVR at 121 and LVH. Tele:  I personally reviewed  Currently denies any chest pain.  Breathing is improved with dialysis.  He had no awareness of a fib.      Past Medical History:  Diagnosis Date  . Bicuspid aortic valve    a. 02/2009 s/p tissue AVR;  b. 07/2014 Echo: EF 55-60%, basal-mid  inf HK, mild AS/MR, mod dil LA, PASP 44 mmHg.  Marland Kitchen CAD (coronary artery disease) October 2010   a. 02/2009 NSTEMI/Cath: 3VD; b. 02/2009 CABG x 5: LIMA->LAD, VG->Diag, VG->RI, VG->RVM->RPDA.  Marland Kitchen GERD (gastroesophageal reflux disease)   . Heart murmur   . Hyperlipidemia   . Hypertension   . Myocardial infarction ([place]) 2010  . PVD (peripheral vascular disease) ([place])    a. s/p toe amputations on L foot.  . Type II diabetes mellitus ([place])     Past Surgical History:  Procedure Laterality Date  . AMPUTATION Left 2011   great and second toes  . AMPUTATION  07/27/2011   Procedure: AMPUTATION FOOT;  Surgeon: Newt Minion, MD;  Location: [facility];  Service: Orthopedics;  Laterality: Left;  Left Midfoot Amputation   . AMPUTATION Left 12/08/2014   Procedure: Left Below Knee Amputation;  Surgeon: Newt Minion, MD;  Location: [facility];  Service: Orthopedics;  Laterality: Left;  . AMPUTATION Right 01/17/2018   Procedure: RIGHT BELOW KNEE AMPUTATION;  Surgeon: Newt Minion, MD;  Location: [facility];  Service: Orthopedics;  Laterality: Right;  . AMPUTATION TOE Right    "all my toes on my right foot have been amputated"  . AORTIC VALVE REPLACEMENT  2010   Pericardial tissue  valve  . CARDIAC CATHETERIZATION  2010  . CARDIAC VALVE REPLACEMENT    . CATARACT EXTRACTION W/ INTRAOCULAR LENS  IMPLANT, BILATERAL Bilateral   . CORONARY ARTERY BYPASS GRAFT  2010   "CABG X5"  . TEE WITHOUT CARDIOVERSION N/A 01/21/2018   Procedure: TRANSESOPHAGEAL ECHOCARDIOGRAM (TEE);  Surgeon: Buford Dresser, MD;  [facility] ENDOSCOPY;  Service: Cardiovascular;  Laterality: N/A;     Home Medications:  Prior to Admission medications   Medication Sig Start Date End Date Taking? Authorizing Provider  aspirin 325 MG tablet Take 325 mg by mouth daily.     Yes [provider]  bisacodyl (DULCOLAX) 10 MG suppository Place 10 mg rectally once as needed (for constipation not relieved by Milk of Magnesia).    Yes [provider]  Calcium Citrate-Vitamin D (CALCIUM CITRATE + D3) 250-200 MG-UNIT TABS Take 1 tablet by mouth 2 (two) times daily.   Yes [provider]  ceFAZolin (ANCEF) IVPB Inject 2 g into the vein every 8 (eight) hours. Indication:  MSSA bacteremia/endocarditis Last Day of Therapy:  03/08/18 Labs - Once weekly:  CBC/D and BMP, Labs - Every other week:  ESR and CRP 01/22/18 03/09/18 Yes Florencia Reasons, MD  cetirizine (ZYRTEC) 10 MG tablet Take 10 mg by mouth daily.   Yes [provider]  Dulaglutide (TRULICITY) 1.5 OI/7.1IW SOPN Inject 1.5 mg into the skin every Monday.    Yes [provider]  glipiZIDE (GLUCOTROL XL) 10 MG 24 hr tablet Take 10 mg by mouth daily.  04/02/16  Yes [provider]  insulin aspart (NOVOLOG) 100 UNIT/ML injection Before each meal 3 times a day, 140-199 - 2 units, 200-250 - 4 units, 251-299 - 6 units,  300-349 - 8 units,  350 or above 10 units. Insulin PEN if approved, provide syringes and needles if needed. Patient taking differently: Inject 2-10 Units into the skin See admin instructions. Inject 2-10 units into the skin three times a day before meals, per sliding scale: BGL 140-199 = 2 units; 200-250 = 4 units; 251-299 = 6 units; 300-349 = 8 units; 350 or greater = 10 units 01/22/18  Yes Florencia Reasons, MD  ketoconazole (NIZORAL) 2 % cream Apply 1 application topically See admin instructions. Apply to both buttocks and "GL fold" rash two times a day   Yes [provider]  latanoprost (XALATAN) 0.005 % ophthalmic solution Place 1 drop into both eyes at bedtime. 07/13/14  Yes [provider]  losartan (COZAAR) 50 MG tablet Take 50 mg by mouth daily.  07/19/14  Yes [provider]  magnesium hydroxide (MILK OF MAGNESIA) 400 MG/5ML suspension Take 30 mLs by mouth once as needed for mild constipation.   Yes [provider]  metoprolol (LOPRESSOR) 50 MG tablet TAKE 2 TABLETS BY MOUTH EVERY MORNING AND 1 EVERY  EVENING Patient taking differently: Take 50-100 mg by mouth See admin instructions. Take 100 mg by mouth in the morning and 50 mg in the evening 12/30/15  Yes Fenton Malling M, PA-C  Multiple Vitamin (MULTIVITAMIN) tablet Take 1 tablet by mouth daily.     Yes [provider]  naproxen sodium (ANAPROX) 220 MG tablet Take 220 mg by mouth 2 (two) times daily as needed (pain).   Yes [provider]  oxyCODONE (OXY IR/ROXICODONE) 5 MG immediate release tablet Take 1 tablet (5 mg total) by mouth every 4 (four) hours as needed for breakthrough pain (pain score 4-6). Patient taking differently: Take 5 mg by  mouth every 4 (four) hours as needed for breakthrough pain (for pain).  01/22/18  Yes Florencia Reasons, MD  pantoprazole (PROTONIX) 40 MG tablet Take 40 mg by mouth daily.     Yes [provider]  polyethylene glycol (MIRALAX / GLYCOLAX) packet Take 17 g by mouth daily as needed for mild constipation. 01/22/18  Yes Florencia Reasons, MD  simvastatin (ZOCOR) 20 MG tablet Take 20 mg by mouth daily at 6 PM.  08/17/14  Yes [provider]  Sodium Phosphates (RA SALINE ENEMA) 19-7 GM/118ML ENEM Place 1 enema rectally once as needed (for constipation not relieved by Dulcolax suppository and notify MD if no relief from enema).   Yes [provider]  TOUJEO SOLOSTAR 300 UNIT/ML SOPN Inject 30 Units into the skin See admin instructions. Use up to 100 units daily Patient taking differently: Inject 30 Units into the skin daily.  01/22/18  Yes Florencia Reasons, MD  furosemide (LASIX) 40 MG tablet Take 40 mg by mouth 2 (two) times daily.    07/23/11  [provider]    Inpatient Medications: Scheduled Meds: . Chlorhexidine Gluconate Cloth  6 each Topical Q0600  . Gerhardt's butt cream   Topical TID  . heparin  5,000 Units Subcutaneous Q8H  . insulin aspart  0-5 Units Subcutaneous QHS  . insulin aspart  0-9 Units Subcutaneous TID WC  . insulin aspart  8 Units Subcutaneous TID WC  .  insulin glargine  25 Units Subcutaneous QHS  . latanoprost  1 drop Both Eyes QHS  . mouth rinse  15 mL Mouth Rinse BID  . metoprolol tartrate  50 mg Oral BID  . nystatin   Topical BID  . pantoprazole  40 mg Oral Q1200  . polyethylene glycol  17 g Oral Daily  . senna-docusate  1 tablet Oral BID   Continuous Infusions: . DAPTOmycin (CUBICIN)  IV 700 mg (02/03/18 2100)   PRN Meds: acetaminophen **OR** acetaminophen, clonazepam, ondansetron **OR** ondansetron (ZOFRAN) IV, sodium chloride flush  Allergies:    Allergies  Allergen Reactions  . Ace Inhibitors Cough    Social History:   Social History   Socioeconomic History  . Marital status: Single    Spouse name: Not on file  . Number of children: Not on file  . Years of education: Not on file  . Highest education level: Not on file  Occupational History  . Occupation: Publishing rights manager: [employer]  . Financial resource strain: Not on file  . Food insecurity:    Worry: Not on file    Inability: Not on file  . Transportation needs:    Medical: Not on file    Non-medical: Not on file  Tobacco Use  . Smoking status: Former Smoker    Packs/day: 1.00    Years: 24.00    Pack years: 24.00    Types: Cigarettes    Last attempt to quit: 10/15/2000    Years since quitting: 17.3  . Smokeless tobacco: Never Used  Substance and Sexual Activity  . Alcohol use: Yes    Comment: 01/15/2018 "1-2 drinks/month"  . Drug use: No  . Sexual activity: Not Currently  Lifestyle  . Physical activity:    Days per week: Not on file    Minutes per session: Not on file  . Stress: Not on file  Relationships  . Social connections:    Talks on phone: Not on file    Gets together:  Not on file    Attends religious service: Not on file    Active member of club or organization: Not on file    Attends meetings of clubs or organizations: Not on file    Relationship status: Not on file  . Intimate partner  violence:    Fear of current or ex partner: Not on file    Emotionally abused: Not on file    Physically abused: Not on file    Forced sexual activity: Not on file  Other Topics Concern  . Not on file  Social History Narrative   The patient lives alone in [place].   He is on a diabetic diet with regular exercise at work, but little outside of his work.    Family History:    Family History  Problem Relation Age of Onset  . Heart attack Father        early age  . Heart disease Father   . Stroke Father   . Heart attack Brother        At age 35  . Stroke Brother   . Diabetes type II Mother   . Diabetes type II Sister   . Diabetes Unknown   . Coronary artery disease Unknown      ROS:  Please see the history of present illness.  General:no colds or fevers, no weight changes Skin:no rashes or ulcers HEENT:no blurred vision, no congestion CV:see HPI PUL:see HPI GI:no diarrhea constipation or melena, no indigestion GU:no hematuria, no dysuria MS:no joint pain, + claudication with amputation Neuro:no syncope, no lightheadedness Endo:+ diabetes, no thyroid disease  All other ROS reviewed and negative.     Physical Exam/Data:   Vitals:   02/04/18 0459 02/04/18 0754 02/04/18 0913 02/04/18 1000  BP: 136/71 131/64    Pulse: 89 91    Resp: 20 18    Temp: 98.3 F (36.8 C) 98.2 F (36.8 C)    TempSrc: Oral Oral    SpO2: 99% 100%    Weight:   53.1 kg 95.9 kg  Height:        Intake/Output Summary (Last 24 hours) at 02/04/2018 1509 Last data filed at 02/04/2018 1359 Gross per 24 hour  Intake 1074 ml  Output 3051 ml  Net -1977 ml   Filed Weights   02/03/18 1645 02/04/18 0913 02/04/18 1000  Weight: 50.8 kg 53.1 kg 95.9 kg   Body mass index is 30.34 kg/m.  General:  Male well nourished but ill in appearance in no acute distress HEENT: normal Lymph: no adenopathy Neck: no JVD Endocrine:  No thryomegaly Vascular: No carotid bruits;bilateral BKA Cardiac:  normal  S1, S2; RRR; no murmur, gallup rub or click Lungs:  rales to auscultation bilaterally, no wheezing, rhonchi or rales  Abd: soft, nontender, no hepatomegaly  Ext: no edema with hx bil BKA Musculoskeletal:  Bil BKA Skin: warm and dry  Neuro:  CNs 2-12 intact, no focal abnormalities noted Psych:  Normal affect     Relevant CV Studies: 01/15/18 TTE  Study Conclusions  - Left ventricle: The cavity size was normal. Wall thickness was   increased in a pattern of mild LVH. The estimated ejection   fraction was 55%. Wall motion was normal; there were no regional   wall motion abnormalities. Features are consistent with a   pseudonormal left ventricular filling pattern, with concomitant   abnormal relaxation and increased filling pressure (grade 2   diastolic dysfunction). - Aortic valve: Normally functioning bioprosthetic aortic valve.  Mean gradient (S): 16 mm Hg. Valve area (VTI): 1.88 cm^2. - Mitral valve: Mildly calcified annulus. There was trivial   regurgitation. - Left atrium: The atrium was mildly dilated. - Right ventricle: Poorly visualized. The cavity size was normal.   Systolic function was mildly reduced. - Right atrium: The atrium was mildly dilated. - Tricuspid valve: Peak RV-RA gradient (S): 23 mm Hg. - Pulmonary arteries: PA peak pressure: 31 mm Hg (S). - Systemic veins: IVC measured 1.9 cm with < 50% respirophasic   variation, suggesting RA pressure 8 mmHg.  Impressions:  - Normal LV size with mild LV hypertrophy. EF 55%. Moderate   diastolic dysfunction. Normal RV size with mildly reduced   systolic function. Bioprosthetic aortic valve appeared normal.  TEE 01/21/18 Study Conclusions  - Left ventricle: Systolic function was normal. The estimated   ejection fraction was in the range of 50% to 55%. Wall motion was   normal; there were no regional wall motion abnormalities. - Aortic valve: A prosthesis was present and functioning normally.   The prosthesis  had a normal range of motion. The sewing ring   appeared normal, had no rocking motion, and showed no evidence of   dehiscence. - Mitral valve: Mildly calcified annulus. - Left atrium: No evidence of thrombus in the atrial cavity or   appendage. - Right atrium: No evidence of thrombus in the atrial cavity or   appendage. - Atrial septum: There was redundancy of the septum, with   borderline criteria for aneurysm. - Tricuspid valve: There was a vegetation.  Impressions:  - Bioprosthetic AVR does not appear to have vegetations present.   Functioning normally, no significant regurgitation.     Laboratory Data:  Chemistry Recent Labs  Lab 02/02/18 0426 02/03/18 0309 02/04/18 0410  NA 130*  130* 130* 130*  K 4.4  4.4 4.8 4.3  CL 98  98 96* 97*  CO2 21*  21* 20* 23  GLUCOSE 251*  251* 198* 229*  BUN 62*  62* 74* 43*  CREATININE 8.53*  8.50* 9.77* 6.88*  CALCIUM 6.9*  6.9* 6.7* 6.6*  GFRNONAA 6*  6* 5* 8*  GFRAA 7*  7* 6* 9*  ANIONGAP '11  11 14 10    ' Recent Labs  Lab 02/02/18 0426 02/03/18 0309 02/04/18 0410  ALBUMIN 1.9* 1.9* 1.9*   Hematology Recent Labs  Lab 01/31/18 0350 02/01/18 0419 02/02/18 0426  WBC 9.6 9.3 10.9*  RBC 3.45* 3.21* 3.15*  HGB 9.7* 9.1* 9.0*  HCT 30.1* 28.6* 28.1*  MCV 87.2 89.1 89.2  MCH 28.1 28.3 28.6  MCHC 32.2 31.8 32.0  RDW 14.6 14.6 14.6  PLT 137* 149* 151   Cardiac EnzymesNo results for input(s): TROPONINI in the last 168 hours. No results for input(s): TROPIPOC in the last 168 hours.  BNPNo results for input(s): BNP, PROBNP in the last 168 hours.  DDimer No results for input(s): DDIMER in the last 168 hours.  Radiology/Studies:  Dg Abd 2 Views  Result Date: 02/03/2018 CLINICAL DATA:  Abdominal pain and distension EXAM: ABDOMEN - 2 VIEW COMPARISON:  January 28, 2018 FINDINGS: Supine and left lateral decubitus images were obtained. There is moderate stool throughout colon. There is no appreciable bowel  dilatation or air-fluid levels to suggest bowel obstruction. No evident free air. There are vascular calcifications in the pelvis. Visualized lung bases are clear. IMPRESSION: Moderate stool in colon. No evident bowel obstruction or free air. Visualized lung bases clear. Electronically Signed   By: Gwyndolyn Saxon  Jasmine December III M.D.   On: 02/03/2018 10:06    Assessment and Plan:   1. PAF with dialysis-HD, given BB and converted to SR  With elevated CHA2DS2VASc would need anticoagulation though lasted  At least 12 hours  Pt was not aware he was in irregular HR.  Will add coumadin 2.  AKI needing emergent HD due to acute respiratory failure. 3. CAD with CABG after MI in 2010 no angina 4. AVR 2010 and stable on TEE 01/21/18 5. 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 MSSA bacteremia with splenic infarct  6. HTN stable holding ACE with AKI, on BB 7. HLD holding statin due to daptomycin 8. S/p BKA bilateral, rt done in Sept followed by Dr. Sharol Given.      For questions or updates, please contact [place] Please consult [email or web address] for contact info under     Signed, Cecilie Kicks, NP  02/04/2018 3:09 PM   I have examined the patient and reviewed assessment and plan and discussed with patient.  Agree with above as stated.  Patient with PAF.  Elevated CHADS-vasc score.  To reduce risk of CVA, would anticoagulate.  Warfarin would be the only choice given ARF. Back in NSR.  EF 55% by 01/15/18 echo.  Will follow.    Larae Grooms

## 2018-02-04 NOTE — Progress Notes (Signed)
[name] for warfarin  Indication: atrial fibrillation  Allergies  Allergen Reactions  . Ace Inhibitors Cough    Patient Measurements: Height: 5\' 10"  (177.8 cm) Weight: 211 lb 6.7 oz (95.9 kg) IBW/kg (Calculated) : 73   Vital Signs: Temp: 98.2 F (36.8 C) (10/01 0754) Temp Source: Oral (10/01 0754) BP: 137/75 (10/01 1619) Pulse Rate: 90 (10/01 1619)  Labs: Recent Labs    02/02/18 0426 02/03/18 0309 02/04/18 0410  HGB 9.0*  --   --   HCT 28.1*  --   --   PLT 151  --   --   CREATININE 8.53*  8.50* 9.77* 6.88*    Estimated Creatinine Clearance: 13.1 mL/min (A) (by C-G formula based on SCr of 6.88 mg/dL (H)).   Medical History: Past Medical History:  Diagnosis Date  . Bicuspid aortic valve    a. 02/2009 s/p tissue AVR;  b. 07/2014 Echo: EF 55-60%, basal-mid inf HK, mild AS/MR, mod dil LA, PASP 44 mmHg.  Marland Kitchen CAD (coronary artery disease) October 2010   a. 02/2009 NSTEMI/Cath: 3VD; b. 02/2009 CABG x 5: LIMA->LAD, VG->Diag, VG->RI, VG->RVM->RPDA.  Marland Kitchen GERD (gastroesophageal reflux disease)   . Heart murmur   . Hyperlipidemia   . Hypertension   . Myocardial infarction ([place]) 2010  . PVD (peripheral vascular disease) ([place])    a. s/p toe amputations on L foot.  . Type II diabetes mellitus ([place])     Assessment: 62 yo M with new onset PAF to start on warfarin. CBC relatively stable. No previous anticoagulation noted.   Goal of Therapy:  INR 2-3 Monitor platelets by anticoagulation protocol: Yes   Plan:  1. Warfarin 5 mg x 1 this evening  2. Daily INR starting on 10/2 3. Heparin subq until INR therapeutic   Vincenza Hews, PharmD, BCPS 02/04/2018, 4:33 PM

## 2018-02-04 NOTE — Progress Notes (Signed)
[facility]                                                                              Patient Demographics  [name], is a 62 y.o. male, DOB - May 02, 1956, LXB:[id number]  Admit date - 01/27/2018   Admitting Physician Vianne Bulls, MD  Outpatient Primary MD for the patient is [name], MD  Outpatient specialists:   LOS - 8  days   Medical records reviewed and are as summarized below:    Chief Complaint  Patient presents with  . 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  He reports an uneventful course at the nursing home until his urine output dropped off over the past couple days, noting that he has urinated very little in the past 2 to 3 days.  He has developed some mild suprapubic discomfort, but denies dysuria or flank pain and denies any fevers or chills.  He was evaluated with basic blood work at the nursing facility and found to have creatinine markedly elevated.  Creatinine 9.6 at the time of admission, 1.1 on 9/18 Patient was transferred to ICU on 9/25 due to respiratory distress, not responding to Lasix.  He was placed on BiPAP and HD cath was placed by CCM, hemodialysis started emergently. Patient transferred to the floor, [facility] hospitalist assumed care back on 9/27  Assessment & Plan    Principal Problem: Acute kidney injury: Presenting with oliguria to anuria in the last 2 to 3 days prior to admission -Baseline creatinine 1.1, unclear etiology, renal ultrasound did  not reveal any obstruction. Possible acute interstitial nephritis, medications, ?  Ancef -Patient was initially placed on IV fluid hydration however discontinued after developing shortness of breath.  He was placed on high-dose Lasix with no significant improvement.  Patient was transferred to ICU on 9/25 due to respiratory distress not responding to Lasix. -Temporary HD cath was placed by CCM, patient started on hemodialysis on 9/25, had back-to-back HD's. -Underwent hemodialysis again yesterday 9/30, feeling better today  Active Problems: Atrial fib with RVR: Paroxysmal, new onset, brief -Patient developed atrial fib with RVR during hemodialysis yesterday, today back to normal sinus rhythm -Continue metoprolol 50 mg twice a day, recent 2D echo and TEE this month.  Discussed in detail with the patient, he states he has followed a Dr. Percival Spanish in the past and would like to see cardiology. -Cardiology consulted, will defer repeating echo or starting anticoagulation to cards  Acute respiratory failure with hypoxia -Likely due to volume overload/pulmonary edema from ESRD -Off BiPAP now, started on urgent hemodialysis on 9/25.  Patient received back-to-back HD, holding on the weekend. -Underwent repeat HD on 9/30, shortness of  breath is improving.  Previous MSSA bacteremia with splenic infarct, tricuspid valve endocarditis -ID following, Ancef discontinued.   -Patient started on IV daptomycin.  Per ID, continue daptomycin 24 more days from 9/27  Oral thrush, diffuse erythema in the intertriginous areas -Continue Diflucan for 7 days    Insulin-requiring or dependent type II diabetes mellitus ([place]), uncontrolled with hypoglycemia -Hemoglobin A1c 8.5 -Patient has been hypoglycemic this admission, hence continued on sliding scale insulin. -Prior to admission, patient was on Trulicity, glipizide, sliding scale insulin, Toujeo 30 units daily -Started hemodialysis, monitor insulin requirements  closely -CBG 333 this morning, increase to Lantus to 25 units at bedtime, increase NovoLog meal coverage to 8 units 3 times daily AC, continue sliding scale insulin    Essential hypertension,  CAD (coronary artery disease) -Change aspirin, beta-blocker, holding statin due to daptomycin -Continue to hold ACE inhibitor     S/P BKA (below knee amputation) unilateral, right (HCC) -Appreciate orthopedics following, Dr. Sharol Given, currently no cellulitis or drainage or wound dehiscence -Recommended removal of staples in 1 to 2 weeks, follow-up in office     Hyponatremia -Plateaued at 130  Code Status: Full CODE STATUS DVT Prophylaxis: Heparin subcu Family Communication: Discussed in detail with the patient, all imaging results, lab results explained to the patient    Disposition Plan: Patient needs to remain inpatient due to hemodialysis needs, unclear if he will need permanent dialysis.  In that situation will need vascular access and clip.  Time Spent in minutes 25   Procedures:  BiPAP Hemodialysis  Consultants:   Infectious disease Nephrology CCM  Antimicrobials:  Daptomycin 9/25>>  Medications  Scheduled Meds: . Chlorhexidine Gluconate Cloth  6 each Topical Q0600  . Chlorhexidine Gluconate Cloth  6 each Topical Q0600  . fluconazole  50 mg Oral Daily  . Gerhardt's butt cream   Topical TID  . heparin  5,000 Units Subcutaneous Q8H  . insulin aspart  0-5 Units Subcutaneous QHS  . insulin aspart  0-9 Units Subcutaneous TID WC  . insulin aspart  5 Units Subcutaneous TID WC  . insulin glargine  18 Units Subcutaneous QHS  . latanoprost  1 drop Both Eyes QHS  . mouth rinse  15 mL Mouth Rinse BID  . metoprolol tartrate  50 mg Oral BID  . nystatin   Topical BID  . pantoprazole  40 mg Oral Q1200  . polyethylene glycol  17 g Oral Daily  . senna-docusate  1 tablet Oral BID   Continuous Infusions: . DAPTOmycin (CUBICIN)  IV 700 mg (02/03/18 2100)   PRN Meds:.acetaminophen **OR**  acetaminophen, clonazepam, ondansetron **OR** ondansetron (ZOFRAN) IV, sodium chloride flush   Antibiotics   Anti-infectives (From admission, onward)   Start     Dose/Rate Route Frequency Ordered Stop   01/30/18 2000  DAPTOmycin (CUBICIN) 700 mg in sodium chloride 0.9 % IVPB     700 mg 228 mL/hr over 30 Minutes Intravenous Every 48 hours 01/30/18 1306     01/30/18 1000  fluconazole (DIFLUCAN) tablet 50 mg     50 mg Oral Daily 01/29/18 0930 02/05/18 0959   01/29/18 2000  DAPTOmycin (CUBICIN) 700 mg in sodium chloride 0.9 % IVPB  Status:  Discontinued     700 mg 228 mL/hr over 30 Minutes Intravenous Every 48 hours 01/29/18 1013 01/30/18 1306   01/29/18 1030  fluconazole (DIFLUCAN) tablet 100 mg     100 mg Oral  Once 01/29/18 0930 01/29/18 1121   01/28/18 2200  ceFAZolin (ANCEF) IVPB 1  g/50 mL premix  Status:  Discontinued     1 g 100 mL/hr over 30 Minutes Intravenous Every 24 hours 01/28/18 1007 01/28/18 1231   01/27/18 2245  ceFAZolin (ANCEF) IVPB 2g/100 mL premix  Status:  Discontinued     2 g 200 mL/hr over 30 Minutes Intravenous Every 12 hours 01/27/18 2243 01/28/18 1007   01/27/18 2230  ceFAZolin (ANCEF) IVPB  Status:  Discontinued    Note to Pharmacy:  Indication:  MSSA bacteremia/endocarditis Last Day of Therapy:  03/08/18 Labs - Once weekly:  CBC/D and BMP, Labs - Every other week:  ESR and CRP     2 g Intravenous Every 8 hours 01/27/18 2220 01/27/18 2227   01/27/18 2230  ceFAZolin (ANCEF) IVPB 2g/100 mL premix  Status:  Discontinued     2 g 200 mL/hr over 30 Minutes Intravenous Every 8 hours 01/27/18 2227 01/27/18 2243        Subjective:   Massie Caridi was seen and examined today.  Feeling somewhat better today after hemodialysis.  During hemodialysis, went into rapid atrial fibrillation with RVR with heart rate into the 130s.  Today normal sinus rhythm.  No chest pain.  Shortness of breath better than yesterday.  Objective:   Vitals:   02/03/18 1630 02/03/18 1645  02/04/18 0459 02/04/18 0754  BP: (!) 99/48 124/62 136/71 131/64  Pulse: (!) 104 (!) 106 89 91  Resp:  _0 Temp:  98 F (36.7 C) 98.3 F (36.8 C)   TempSrc:  Oral Oral   SpO2:  99% 99% 100%  Weight:  50.8 kg    Height:        Intake/Output Summary (Last 24 hours) at 02/04/2018 0811 Last data filed at 02/04/2018 0500 Gross per 24 hour  Intake 794 ml  Output 3051 ml  Net -2257 ml     Wt Readings from Last 3 Encounters:  02/03/18 50.8 kg  01/23/18 95.2 kg  01/16/18 95.2 kg     Exam    General: Alert and oriented x 3, NAD  Eyes:   HEENT:  Atraumatic, normocephalic  Cardiovascular: S1 S2 auscultated, Regular rate and rhythm.  Respiratory: Decreased breath sound at the bases  Gastrointestinal: Soft, mild diffuse tenderness, nondistended, + bowel sounds  Ext: bilateral BKA  Neuro: no new FND's  Musculoskeletal: No digital cyanosis, clubbing  Skin: No rashes  Psych: Normal affect and demeanor, alert and oriented x3    Data Reviewed:  I have personally reviewed following labs and imaging studies  Micro Results Recent Results (from the past 240 hour(s))  Urine culture     Status: None   Collection Time: 01/28/18  4:29 PM  Result Value Ref Range Status   Specimen Description URINE, CATHETERIZED  Final   Special Requests NONE  Final   Culture   Final    NO GROWTH Performed at [facility], [address], [place], [place] [address]    Report Status 01/29/2018 FINAL  Final    Radiology Reports Dg Abd 1 View  Result Date: 01/28/2018 CLINICAL DATA:  Abdominal pain EXAM: ABDOMEN - 1 VIEW COMPARISON:  CT abdomen pelvis of 01/14/2018 FINDINGS: A supine film of the abdomen shows the lumbar spine to be somewhat rotated to the right. No bowel obstruction is seen. However there is paucity of bowel gas in the left abdomen of doubtful significance in view of the recent CT scan showing no mass. Calcified vas deferens are noted in the pelvis. IMPRESSION:  1.  No bowel obstruction. 2. Paucity of bowel gas in the left abdomen of doubtful significance. 3. Calcified vas deferens. Electronically Signed   By: Ivar Drape M.D.   On: 01/28/2018 11:24   Ct Head Wo Contrast  Result Date: 01/14/2018 CLINICAL DATA:  Unexplained altered level consciousness. Slurred speech. Nausea. Frontal headache. Nausea and vomiting. EXAM: CT HEAD WITHOUT CONTRAST TECHNIQUE: Contiguous axial images were obtained from the base of the skull through the vertex without intravenous contrast. COMPARISON:  07/27/2014 FINDINGS: Brain: No evidence of acute infarction, hemorrhage, hydrocephalus, extra-axial collection, or mass lesion/mass effect. Mild generalized cerebral atrophy, without significant change. Vascular:  No hyperdense vessel or other acute findings. Skull: No evidence of fracture or other significant bone abnormality. Sinuses/Orbits:  No acute findings. Other: None. IMPRESSION: No acute intracranial abnormality.  Mild cerebral atrophy. Electronically Signed   By: Earle Gell M.D.   On: 01/14/2018 20:05   Ct Abdomen Pelvis W Contrast  Result Date: 01/14/2018 CLINICAL DATA:  Acute abdominal pain. EXAM: CT ABDOMEN AND PELVIS WITH CONTRAST TECHNIQUE: Multidetector CT imaging of the abdomen and pelvis was performed using the standard protocol following bolus administration of intravenous contrast. CONTRAST:  120m ISOVUE-300 IOPAMIDOL (ISOVUE-300) INJECTION 61% COMPARISON:  None. FINDINGS: Lower chest: Motion artifact through the lung bases. Mild dependent atelectasis on the left. Heart appears prominent size. There are coronary artery calcifications. Hepatobiliary: Decreased hepatic density consistent with steatosis. No discrete focal lesion. Hyperdensity at the gallbladder neck may be stones or sludge. No pericholecystic inflammation. No biliary dilatation. Pancreas: Fatty atrophy.  No ductal dilatation or inflammation. Spleen: Normal in size. Peripheral slightly wedge-shaped low-density  in the posterior spleen measures approximately 4 cm in depth. No perisplenic fluid. Adrenals/Urinary Tract: Mild thickening left adrenal gland without dominant nodule. Normal right adrenal gland. No hydronephrosis. Symmetric bilateral perinephric edema. Homogeneous renal enhancement. Possible small exophytic cyst from the mid left kidney versus perinephric edema. Urinary bladder is physiologically distended. No bladder wall thickening. Stomach/Bowel: Small hiatal hernia. Motion artifact and lack of enteric contrast limits detailed bowel assessment. Allowing for this, no evidence of obstruction, inflammation or bowel wall thickening. Normal appendix. Vascular/Lymphatic: Moderate aorto bi-iliac atherosclerosis without aneurysm. Portal vein and mesenteric vessels are patent. No enlarged abdominal or pelvic lymph nodes. Reproductive: Prostate is unremarkable. Other: No free air or free fluid. Musculoskeletal: Scoliosis and degenerative change throughout the lumbar spine. Slight flattening of the left femoral head may be chronic. No acute osseous abnormality, particularly, no left rib fractures. IMPRESSION: 1. 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 No perisplenic fluid. 2. Symmetric bilateral perinephric edema, likely chronic but can be seen in the setting of urinary tract infection. 3. Gallstones or sludge without gallbladder inflammation on CT. 4. Incidental hepatic steatosis and Aortic Atherosclerosis (ICD10-I70.0). Electronically Signed   By: MKeith RakeM.D.   On: 01/14/2018 22:19   [place]al  Result Date: 01/27/2018 CLINICAL DATA:  62year old male with acute renal failure. EXAM: RENAL / URINARY TRACT ULTRASOUND COMPLETE COMPARISON:  CT of abdomen pelvis dated 01/14/2018 FINDINGS: Right Kidney: Length: 12.2 cm. Normal echogenicity. No hydronephrosis or shadowing stone. Left Kidney: Length: 11.7 cm. Normal echogenicity. No hydronephrosis or shadowing stone. Bladder:  There is layering debris in the urinary bladder. Correlation with urinalysis recommended. The liver is slightly echogenic likely fatty infiltration. There is probable Riedel's lobe anatomy. IMPRESSION: 1. No hydronephrosis or shadowing stone. 2. Layering debris within the urinary bladder. Correlation with urinalysis recommended. Electronically Signed   By:  Anner Crete M.D.   On: 01/27/2018 22:38   Mr Foot Right W Wo Contrast  Result Date: 01/16/2018 CLINICAL DATA:  Necrotic gangrenous ulcer on the lateral aspect of the right foot with ascending cellulitis. EXAM: MRI OF THE RIGHT FOREFOOT WITHOUT AND WITH CONTRAST TECHNIQUE: Multiplanar, multisequence MR imaging of the right foot was performed before and after the administration of intravenous contrast. CONTRAST:  10 cc Gadavist COMPARISON:  Radiographs 01/15/2018 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 metatarsal level. I do not see any marrow signal abnormality or enhancement to suggest osteomyelitis. No findings for septic arthritis. No subcutaneous soft tissue swelling/edema/fluid surrounding the foot suggesting cellulitis but no discrete drainable soft tissue abscess or findings for pyomyositis. IMPRESSION: 1. Changes suggesting cellulitis but no discrete drainable soft tissue abscess. 2. No MR findings for septic arthritis, osteomyelitis or pyomyositis. Electronically Signed   By: Marijo Sanes M.D.   On: 01/16/2018 10:53   Dg Chest Port 1 View  Result Date: 01/31/2018 CLINICAL DATA:  Acute respiratory failure EXAM: PORTABLE CHEST 1 VIEW COMPARISON:  01/29/2018 FINDINGS: Cardiac shadow is stable. Postoperative changes are seen. Right jugular central line is noted in satisfactory position. The right-sided PICC line is also noted in stable position. The lungs are well aerated with minimal left basilar atelectasis. No pneumothorax is seen. No bony abnormality is noted. IMPRESSION: Mild left basilar atelectasis. Tubes  and lines as described. Electronically Signed   By: Inez Catalina M.D.   On: 01/31/2018 08:12   Dg Chest Port 1 View  Result Date: 01/29/2018 CLINICAL DATA:  Severe shortness of breath. EXAM: PORTABLE CHEST 1 VIEW COMPARISON:  01/27/2018. FINDINGS: Stable enlarged cardiac silhouette, post CABG changes and prosthetic aortic valve. Interval right jugular catheter with its tip in the superior vena cava. No pneumothorax. Stable right PICC with its tip in the inferior aspect of the superior vena cava. Increased prominence of the interstitial markings, including Kerley lines with an interval small left pleural effusion. Stable left lung postsurgical changes. No acute bony abnormality. IMPRESSION: Interval changes of congestive heart failure with stable cardiomegaly. Electronically Signed   By: Claudie Revering M.D.   On: 01/29/2018 16:24   Dg Chest Portable 1 View  Result Date: 01/27/2018 CLINICAL DATA:  Intermittent LEFT chest pain.  Oliguric. EXAM: PORTABLE CHEST 1 VIEW COMPARISON:  Chest radiograph January 15, 2018 FINDINGS: RIGHT PICC distal tip projects in distal superior vena cava. No pneumothorax. Stable mild cardiomegaly. Status post aortic valve replacement. LEFT mid lung zone bandlike density with mildly elevated LEFT hemidiaphragm. No focal consolidation. Blunting of the LEFT costophrenic angle. No pneumothorax. Soft tissue planes and included osseous structures are non suspicious. Surgical clips bilateral axilla. IMPRESSION: 1. RIGHT PICC distal tip projecting in distal superior vena cava. No pneumothorax. 2. LEFT lung base atelectasis with small pleural effusion versus pleural thickening. 3. Stable cardiomegaly. Electronically Signed   By: Elon Alas M.D.   On: 01/27/2018 18:43   Dg Chest Port 1 View  Result Date: 01/15/2018 CLINICAL DATA:  Severe sepsis. EXAM: PORTABLE CHEST 1 VIEW COMPARISON:  Radiograph 12/08/2014 FINDINGS: Stable cardiomegaly. Post CABG with prosthetic valve. No sternal  wires visualized. No pulmonary edema. Scattered atelectasis. No confluent airspace disease, pleural effusion or pneumothorax. No acute osseous abnormalities. IMPRESSION: Chronic cardiomegaly with scattered atelectasis. Electronically Signed   By: Keith Rake M.D.   On: 01/15/2018 00:20   Dg Abd 2 Views  Result Date: 02/03/2018 CLINICAL DATA:  Abdominal pain and  distension EXAM: ABDOMEN - 2 VIEW COMPARISON:  January 28, 2018 FINDINGS: Supine and left lateral decubitus images were obtained. There is moderate stool throughout colon. There is no appreciable bowel dilatation or air-fluid levels to suggest bowel obstruction. No evident free air. There are vascular calcifications in the pelvis. Visualized lung bases are clear. IMPRESSION: Moderate stool in colon. No evident bowel obstruction or free air. Visualized lung bases clear. Electronically Signed   By: Lowella Grip III M.D.   On: 02/03/2018 10:06   Dg Foot Complete Right  Result Date: 01/15/2018 CLINICAL DATA:  RIGHT foot infection, sepsis, prior amputation EXAM: RIGHT FOOT COMPLETE - 3+ VIEW COMPARISON:  04/06/2009 FINDINGS: Osseous demineralization. Prior transmetatarsal amputation. Joint spaces preserved. Mild dorsal spur formation at the talonavicular joint. No acute fracture, dislocation or bone destruction. Motion artifacts degrade lateral view, no gross osseous abnormalities identified. Scattered soft tissue swelling without soft tissue gas. IMPRESSION: Transmetatarsal amputation RIGHT foot. No acute osseous findings. Electronically Signed   By: Lavonia Dana M.D.   On: 01/15/2018 00:57   Ct Renal Stone Study  Result Date: 01/29/2018 CLINICAL DATA:  anuria EXAM: CT ABDOMEN AND PELVIS WITHOUT CONTRAST TECHNIQUE: Multidetector CT imaging of the abdomen and pelvis was performed following the standard protocol without oral or IV contrast. COMPARISON:  January 14, 2018 FINDINGS: Lower chest: There are pleural effusions bilaterally with  consolidation in the posterior lung bases. There are foci of coronary artery calcification. There is a small hiatal hernia. Hepatobiliary: There is hepatic steatosis. No focal liver lesions are appreciable. Gallbladder is mildly distended. There are gallstones present. There may also be sludge in the gallbladder. There is no appreciable gallbladder wall thickening. There is no pericholecystic fluid. Pancreas: There is fatty infiltration in the pancreas. No focal pancreatic lesions are evident. Spleen: There is decreased attenuation in the posterior spleen in a wedge-shaped configuration consistent with splenic infarct, felt to be stable from most recent study. No new splenic lesions are evident on this noncontrast enhanced study. Adrenals/Urinary Tract: Adrenals bilaterally appear normal. There is no evident renal mass or hydronephrosis on either side. There is no appreciable renal or ureteral calculus on either side. There is a Foley catheter within the urinary bladder. There is wall thickening in portions of the urinary bladder anteriorly. Air within the urinary bladder may well be iatrogenic given recent catheterization. Stomach/Bowel: There is no appreciable bowel wall or mesenteric thickening. No evident bowel obstruction. There is no appreciable free air or portal venous air. Vascular/Lymphatic: There is aortic atherosclerosis. There is also atherosclerotic calcification in the common iliac arteries. No aneurysm evident. Major mesenteric arterial vessels are felt to be patent, although there are foci of atherosclerotic calcification in mesenteric vessels. There is no appreciable adenopathy in the abdomen or pelvis. Reproductive: Prostate and seminal vesicles are normal in size and contour. There is calcification in the seminal vesicles. No pelvic masses evident. Other: There is ascites in the abdomen and pelvis with areas of apparent loculation of ascites in the pelvis. No abscess is evident in the abdomen or  pelvis. Appendix appears unremarkable. There is mesenteric thickening in the left lower abdomen, near in area of loculated ascites. There is generalized anasarca. Musculoskeletal: Degenerative change throughout the lower thoracic and lumbar regions remain stable. There is loss of cortex with soft tissue thickening along the anterior, inferior aspect of T12 with loss of disc space in this area. There is also lucency along the posterior aspect of the L1 vertebral body, stable. A degree  of discitis/osteomyelitis in this area must be of concern. No intramuscular lesions are evident. IMPRESSION: 1. Loss of cortex along the anterior inferior aspect of the T12 vertebral body as well as lucency in the posterior aspect of the L1 vertebral body. Question a degree of discitis/osteomyelitis in this region. This finding may warrant MR pre and post-contrast to further evaluate with respect to discitis/bony destruction. 2. Generalized anasarca. Areas of partially loculated ascites as well as mild mesenteric thickening in the left lower quadrant. No evident abscess in the abdomen or pelvis. 3. Cholelithiasis with probable sludge in the gallbladder as well. Gallbladder mildly distended. Gallbladder wall not thickened. 4. No evident hydronephrosis on either side. No renal or ureteral calculus on either side. 5. Urinary bladder decompressed. Air within the urinary bladder likely is due to recent catheterization. Correlation with urinalysis to assess for possible gas-forming organism is a cause for air within the bladder is advised. 6.  No bowel obstruction.  Appendix region appears normal. 7.  Small hiatal hernia. 8.  Aortoiliac atherosclerosis.  Coronary artery calcification. 9.  Bilateral pleural effusions with bibasilar consolidation. 10. 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 Aortic Atherosclerosis (ICD10-I70.0). Electronically Signed   By: Lowella Grip III M.D.   On: 01/29/2018 07:08   [place] Ekg  Site Rite  Result Date: 01/21/2018 If Site Rite image not attached, placement could not be confirmed due to current cardiac rhythm.   Lab Data:  CBC: Recent Labs  Lab 01/29/18 0346 01/29/18 1707 01/30/18 0604 01/31/18 0350 02/01/18 0419 02/02/18 0426  WBC 13.2* 13.1* 11.0* 9.6 9.3 10.9*  NEUTROABS 11.0*  --   --   --   --   --   HGB 10.8* 11.6* 11.2* 9.7* 9.1* 9.0*  HCT 32.8* 34.6* 34.6* 30.1* 28.6* 28.1*  MCV 86.1 85.4 86.5 87.2 89.1 89.2  PLT 132* 157 173 137* 149* 229   Basic Metabolic Panel: Recent Labs  Lab 01/31/18 0350 02/01/18 0419 02/02/18 0426 02/03/18 0309 02/04/18 0410  NA 131* 132* 130*  130* 130* 130*  K 4.3 3.9 4.4  4.4 4.8 4.3  CL 99 97* 98  98 96* 97*  CO2 18* 22 21*  21* 20* 23  GLUCOSE 251* 263* 251*  251* 198* 229*  BUN 63* 48* 62*  62* 74* 43*  CREATININE 8.34* 6.91* 8.53*  8.50* 9.77* 6.88*  CALCIUM 6.4* 7.4* 6.9*  6.9* 6.7* 6.6*  MG 2.1  --   --   --   --   PHOS 8.5* 6.7* 7.1* 7.2* 5.8*   GFR: Estimated Creatinine Clearance: 8.1 mL/min (A) (by C-G formula based on SCr of 6.88 mg/dL (H)). Liver Function Tests: Recent Labs  Lab 01/29/18 1707 02/01/18 0419 02/02/18 0426 02/03/18 0309 02/04/18 0410  ALBUMIN 2.1* 1.9* 1.9* 1.9* 1.9*   No results for input(s): LIPASE, AMYLASE in the last 168 hours. No results for input(s): AMMONIA in the last 168 hours. Coagulation Profile: No results for input(s): INR, PROTIME in the last 168 hours. Cardiac Enzymes: Recent Labs  Lab 01/30/18 0500  CKTOTAL 33*   BNP (last 3 results) No results for input(s): PROBNP in the last 8760 hours. HbA1C: No results for input(s): HGBA1C in the last 72 hours. CBG: Recent Labs  Lab 02/03/18 1726 02/03/18 2015 02/04/18 0136 02/04/18 0456 02/04/18 0753  GLUCAP 134* 133* 227* 223* 227*   Lipid Profile: No results for input(s): CHOL, HDL, LDLCALC, TRIG, CHOLHDL, LDLDIRECT in the last 72 hours. Thyroid Function Tests: No  results for input(s):  TSH, T4TOTAL, FREET4, T3FREE, THYROIDAB in the last 72 hours. Anemia Panel: No results for input(s): VITAMINB12, FOLATE, FERRITIN, TIBC, IRON, RETICCTPCT in the last 72 hours. Urine analysis:    Component Value Date/Time   COLORURINE AMBER (A) 01/27/2018 1034   APPEARANCEUR TURBID (A) 01/27/2018 1034   LABSPEC 1.017 01/27/2018 1034   PHURINE 6.0 01/27/2018 1034   GLUCOSEU NEGATIVE 01/27/2018 1034   HGBUR MODERATE (A) 01/27/2018 1034   BILIRUBINUR NEGATIVE 01/27/2018 1034   KETONESUR NEGATIVE 01/27/2018 1034   PROTEINUR >=300 (A) 01/27/2018 1034   UROBILINOGEN 0.2 07/28/2014 0433   NITRITE NEGATIVE 01/27/2018 1034   LEUKOCYTESUR MODERATE (A) 01/27/2018 1034     Kimara Bencomo M.D. [facility] 02/04/2018, 8:11 AM  Pager: [phone] Between 7am to 7pm - call Pager - [phone]  After 7pm go to [email or web address] - password [email or web address]  Call night coverage person covering after 7pm

## 2018-02-05 DIAGNOSIS — I480 Paroxysmal atrial fibrillation: Secondary | ICD-10-CM

## 2018-02-05 DIAGNOSIS — I700 Atherosclerosis of aorta: Secondary | ICD-10-CM

## 2018-02-05 DIAGNOSIS — D696 Thrombocytopenia, unspecified: Secondary | ICD-10-CM

## 2018-02-05 LAB — CBC
HCT: 27.5 % — ABNORMAL LOW (ref 39.0–52.0)
Hemoglobin: 8.8 g/dL — ABNORMAL LOW (ref 13.0–17.0)
MCH: 28.9 pg (ref 26.0–34.0)
MCHC: 32 g/dL (ref 30.0–36.0)
MCV: 90.5 fL (ref 78.0–100.0)
PLATELETS: 121 10*3/uL — AB (ref 150–400)
RBC: 3.04 MIL/uL — ABNORMAL LOW (ref 4.22–5.81)
RDW: 14.1 % (ref 11.5–15.5)
WBC: 10.2 10*3/uL (ref 4.0–10.5)

## 2018-02-05 LAB — GLUCOSE, CAPILLARY
GLUCOSE-CAPILLARY: 114 mg/dL — AB (ref 70–99)
GLUCOSE-CAPILLARY: 192 mg/dL — AB (ref 70–99)
Glucose-Capillary: 72 mg/dL (ref 70–99)
Glucose-Capillary: 121 mg/dL — ABNORMAL HIGH (ref 70–99)
Glucose-Capillary: 122 mg/dL — ABNORMAL HIGH (ref 70–99)
Glucose-Capillary: 145 mg/dL — ABNORMAL HIGH (ref 70–99)
Glucose-Capillary: 199 mg/dL — ABNORMAL HIGH (ref 70–99)

## 2018-02-05 LAB — RENAL FUNCTION PANEL
ALBUMIN: 1.8 g/dL — AB (ref 3.5–5.0)
ANION GAP: 13 (ref 5–15)
BUN: 62 mg/dL — ABNORMAL HIGH (ref 8–23)
CO2: 23 mmol/L (ref 22–32)
Calcium: 6.9 mg/dL — ABNORMAL LOW (ref 8.9–10.3)
Chloride: 94 mmol/L — ABNORMAL LOW (ref 98–111)
Creatinine, Ser: 8.06 mg/dL — ABNORMAL HIGH (ref 0.61–1.24)
GFR calc Af Amer: 7 mL/min — ABNORMAL LOW (ref >60)
GFR calc non Af Amer: 6 mL/min — ABNORMAL LOW (ref >60)
GLUCOSE: 104 mg/dL — AB (ref 70–99)
PHOSPHORUS: 6.1 mg/dL — AB (ref 2.5–4.6)
POTASSIUM: 4.3 mmol/L (ref 3.5–5.1)
Sodium: 130 mmol/L — ABNORMAL LOW (ref 135–145)

## 2018-02-05 LAB — PROTIME-INR
INR: 1.51
PROTHROMBIN TIME: 18.1 s — AB (ref 11.4–15.2)

## 2018-02-05 MED ORDER — LIDOCAINE-PRILOCAINE 2.5-2.5 % EX CREA: 1.0000 "application " | TOPICAL_CREAM | CUTANEOUS | Status: DC | PRN

## 2018-02-05 MED ORDER — HEPARIN SODIUM (PORCINE) 1000 UNIT/ML DIALYSIS: 1000.0000 [IU] | INTRAMUSCULAR | Status: DC | PRN

## 2018-02-05 MED ORDER — HEPARIN SODIUM (PORCINE) 1000 UNIT/ML IJ SOLN
INTRAMUSCULAR | Status: AC
  Administered 2018-02-05: 2400 [IU]
  Filled 2018-02-05: qty 3

## 2018-02-05 MED ORDER — SODIUM CHLORIDE 0.9 % IV SOLN: 100.0000 mL | INTRAVENOUS | Status: DC | PRN

## 2018-02-05 MED ORDER — WARFARIN SODIUM 5 MG PO TABS
5.0000 mg | ORAL_TABLET | Freq: Once | ORAL | Status: AC
  Administered 2018-02-05: 5 mg via ORAL
  Filled 2018-02-05: qty 1

## 2018-02-05 MED ORDER — ALTEPLASE 2 MG IJ SOLR: 2.0000 mg | Freq: Once | INTRAMUSCULAR | Status: DC | PRN

## 2018-02-05 MED ORDER — PRO-STAT SUGAR FREE PO LIQD
30.0000 mL | Freq: Every day | ORAL | Status: DC
  Administered 2018-02-05 – 2018-02-19 (×14): 30 mL via ORAL
  Filled 2018-02-05 (×15): qty 30

## 2018-02-05 MED ORDER — METOPROLOL TARTRATE 50 MG PO TABS
75.0000 mg | ORAL_TABLET | Freq: Two times a day (BID) | ORAL | Status: DC
  Administered 2018-02-05 – 2018-02-18 (×26): 75 mg via ORAL
  Filled 2018-02-05 (×27): qty 1

## 2018-02-05 MED ORDER — INSULIN ASPART 100 UNIT/ML ~~LOC~~ SOLN
4.0000 [IU] | Freq: Three times a day (TID) | SUBCUTANEOUS | Status: DC
  Administered 2018-02-06 – 2018-03-11 (×57): 4 [IU] via SUBCUTANEOUS

## 2018-02-05 MED ORDER — LIDOCAINE HCL (PF) 1 % IJ SOLN: 5.0000 mL | INTRAMUSCULAR | Status: DC | PRN

## 2018-02-05 MED ORDER — PENTAFLUOROPROP-TETRAFLUOROETH EX AERO: 1.0000 "application " | INHALATION_SPRAY | CUTANEOUS | Status: DC | PRN

## 2018-02-05 MED ORDER — INSULIN GLARGINE 100 UNIT/ML ~~LOC~~ SOLN
22.0000 [IU] | Freq: Every day | SUBCUTANEOUS | Status: DC
  Administered 2018-02-05 – 2018-03-08 (×32): 22 [IU] via SUBCUTANEOUS
  Filled 2018-02-05 (×33): qty 0.22

## 2018-02-05 NOTE — Progress Notes (Signed)
PROGRESS NOTE  [name] IRS:[id number] DOB: Dec 26, 1955 DOA: 01/27/2018 PCP: [name], MD  Brief Narrative: 62 year old man complicated PMH hospitalized September for sepsis secondary to right foot osteomyelitis, underwent BKA, found to have MSSA bacteremia with splenic infarct and tricuspid valve endocarditis, discharged on Ancef.  Developed acute oliguria and presented to the emergency department where he was found to have acute kidney injury.  Seen by nephrology and infectious disease, antibiotics changed to daptomycin.  Developed acute respiratory failure necessitating transfer to ICU and critical care service.  Temporary dialysis catheter was placed and the patient was started on hemodialysis.  Respiratory status improved and he was transferred back out to the hospital service.  Assessment/Plan AKI, oliguric, no clear nephrotoxic events, working diagnosis ATN versus AIN from Ancef.  CT renal stone protocol unrevealing.   --Continue to monitor for renal recovery.  Continue management per nephrology.  PAF  with rapid ventricular response, new diagnosis --Continue metoprolol, warfarin  Acute hypoxic respiratory failure secondary to volume overload, pulmonary edema --Appears resolved status post hemodialysis.  Diabetes mellitus type 2, peripheral vascular disease status post bilateral BKA --Reduce Lantus dose, reduce NovoLog meal coverage, continue sliding scale insulin -- Follow-up with Dr. Sharol Given in 1-2 weeks for staple removal.  MSSA bacteremia, tricuspid valve endocarditis, splenic infarct diagnosed 9/17 --Continue daptomycin per infectious disease  Oral candidiasis --Continue fluconazole  Thrombocytopenia, stable.  Seen September 2019 as well.  Follow.  Anemia of critical illness, stable.  Obesity --per dietician  Loss of cortex along the anterior inferior aspect of the T12 vertebral body as well as lucency in the posterior aspect of the L1 vertebral body.  Question a degree of discitis/osteomyelitis in this region. This finding may warrant MR pre and post-contrast to further evaluate with respect to discitis/bony destruction. --Will discuss with infectious disease 10/3  Aortic atherosclerosis --Follow-up as an outpatient  DVT prophylaxis: warfarin Code Status: Full Family Communication: none Disposition Plan: pending    [name], MD  [facility] Direct contact: [phone] --Via [place]  --[email or web address]; password [email or web address]  7PM-7AM contact night coverage as above 02/05/2018, 8:11 PM  LOS: 9 days   Consultants:  PCCM  Nephrology  ID  Cardiology   Procedures:  Temporary HD catheter  HD  Antimicrobials:  Daptomycin   Interval history/Subjective: Feels ok.  Objective: Vitals:  Vitals:   02/05/18 1721 02/05/18 1759  BP: (!) 159/64 (!) 144/80  Pulse: (!) 101 (!) 102  Resp: 18 18  Temp: 98.4 F (36.9 C) 99.4 F (37.4 C)  SpO2: 97% 98%    Exam:  Constitutional:  . Appears calm, mildly uncomfortable Respiratory:  . CTA bilaterally, no w/r/r.  . Respiratory effort normal.  Cardiovascular:  . RRR, no m/r/g Psychiatric:  . Mental status o Mood, affect appropriate   I have personally reviewed the following:   Data: . CBG stable . Hemoglobin stable 8.8 BMP noted INR 1.51  Scheduled Meds: . Chlorhexidine Gluconate Cloth  6 each Topical Q0600  . feeding supplement (PRO-STAT SUGAR FREE 64)  30 mL Oral Daily  . Gerhardt's butt cream   Topical TID  . heparin  5,000 Units Subcutaneous Q8H  . insulin aspart  0-5 Units Subcutaneous QHS  . insulin aspart  0-9 Units Subcutaneous TID WC  . [START ON 02/06/2018] insulin aspart  4 Units Subcutaneous TID WC  . insulin glargine  22 Units Subcutaneous QHS  . latanoprost  1 drop Both Eyes QHS  . mouth rinse  15 mL Mouth  Rinse BID  . metoprolol tartrate  75 mg Oral BID  . nystatin   Topical BID  . pantoprazole  40 mg Oral Q1200  . polyethylene  glycol  17 g Oral Daily  . senna-docusate  1 tablet Oral BID  . Warfarin - Pharmacist Dosing Inpatient   Does not apply q1800   Continuous Infusions: . DAPTOmycin (CUBICIN)  IV 700 mg (02/03/18 2100)    Principal Problem:   Acute renal failure (ARF) (HCC) Active Problems:   Insulin-requiring or dependent type II diabetes mellitus ([place])   Essential hypertension   CAD (coronary artery disease)   Leukocytosis   Thrombocytopenia (HCC)   S/P BKA (below knee amputation) unilateral, right (HCC)   Endocarditis of tricuspid valve   Hyponatremia   PAF (paroxysmal atrial fibrillation) ([place])   Aortic atherosclerosis ([place])   LOS: 9 days

## 2018-02-05 NOTE — Clinical Social Work Note (Addendum)
CSW continuing to monitor patient's progress and progress notes reviewed. Patient will return to [facility] once medically stable and insurance authorization received. CSW will provide SW intervention services as needed and facilitate discharge back to SNF when medically stable.  Jayan Raymundo Givens, MSW, LCSW Licensed Clinical Social Worker [place] [phone]

## 2018-02-05 NOTE — Progress Notes (Signed)
[place] for warfarin  Indication: atrial fibrillation  Allergies  Allergen Reactions  . Ace Inhibitors Cough    Patient Measurements: Height: 5\' 10"  (177.8 cm) Weight: 215 lb 9.8 oz (97.8 kg) IBW/kg (Calculated) : 73   Vital Signs: Temp: 97.6 F (36.4 C) (10/02 0424) Temp Source: Oral (10/01 2018) BP: 135/83 (10/02 0424) Pulse Rate: 85 (10/02 0424)  Labs: Recent Labs    02/03/18 0309 02/04/18 0410 02/05/18 0440  LABPROT  --   --  18.1*  INR  --   --  1.51  CREATININE 9.77* 6.88* 8.06*    Estimated Creatinine Clearance: 11.3 mL/min (A) (by C-G formula based on SCr of 8.06 mg/dL (H)).   Medical History: Past Medical History:  Diagnosis Date  . Bicuspid aortic valve    a. 02/2009 s/p tissue AVR;  b. 07/2014 Echo: EF 55-60%, basal-mid inf HK, mild AS/MR, mod dil LA, PASP 44 mmHg.  Marland Kitchen CAD (coronary artery disease) October 2010   a. 02/2009 NSTEMI/Cath: 3VD; b. 02/2009 CABG x 5: LIMA->LAD, VG->Diag, VG->RI, VG->RVM->RPDA.  Marland Kitchen GERD (gastroesophageal reflux disease)   . Heart murmur   . Hyperlipidemia   . Hypertension   . Myocardial infarction ([place]) 2010  . PVD (peripheral vascular disease) ([place])    a. s/p toe amputations on L foot.  . Type II diabetes mellitus ([place])     Assessment: 62 yo M with new onset PAF to start on warfarin. CBC relatively stable. No previous anticoagulation noted. INR 1.51 this AM  Goal of Therapy:  INR 2-3 Monitor platelets by anticoagulation protocol: Yes   Plan:  1. Warfarin 5 mg x 1 this evening  2. Daily INR 3. Heparin subq until INR therapeutic   Sheritha Louis A. Levada Dy, PharmD, [place] Pager: [phone] Please utilize Amion for appropriate phone number to reach the unit pharmacist ([place])   02/05/2018, 7:55 AM

## 2018-02-05 NOTE — Progress Notes (Signed)
Progress Note  Patient Name: [name] Date of Encounter: 02/05/2018  Primary Cardiologist: Minus Breeding, MD   Subjective   No palpitations.   Inpatient Medications    Scheduled Meds: . Chlorhexidine Gluconate Cloth  6 each Topical Q0600  . Gerhardt's butt cream   Topical TID  . heparin  5,000 Units Subcutaneous Q8H  . insulin aspart  0-5 Units Subcutaneous QHS  . insulin aspart  0-9 Units Subcutaneous TID WC  . insulin aspart  8 Units Subcutaneous TID WC  . insulin glargine  25 Units Subcutaneous QHS  . latanoprost  1 drop Both Eyes QHS  . mouth rinse  15 mL Mouth Rinse BID  . metoprolol tartrate  50 mg Oral BID  . nystatin   Topical BID  . pantoprazole  40 mg Oral Q1200  . polyethylene glycol  17 g Oral Daily  . senna-docusate  1 tablet Oral BID  . warfarin  5 mg Oral ONCE-1800  . Warfarin - Pharmacist Dosing Inpatient   Does not apply q1800   Continuous Infusions: . DAPTOmycin (CUBICIN)  IV 700 mg (02/03/18 2100)   PRN Meds: acetaminophen **OR** acetaminophen, clonazepam, ondansetron **OR** ondansetron (ZOFRAN) IV, sodium chloride flush   Vital Signs    Vitals:   02/04/18 1619 02/04/18 2018 02/05/18 0424 02/05/18 0804  BP: 137/75 134/73 135/83 (!) 144/70  Pulse: 90 99 85 90  Resp: 18 18 18 18   Temp: 98.3 F (36.8 C) 98.6 F (37 C) 97.6 F (36.4 C) 98.8 F (37.1 C)  TempSrc: Oral Oral  Oral  SpO2: 99% 100% 99% 99%  Weight:  97.8 kg    Height:        Intake/Output Summary (Last 24 hours) at 02/05/2018 0829 Last data filed at 02/05/2018 0448 Gross per 24 hour  Intake 1030 ml  Output 100 ml  Net 930 ml   Filed Weights   02/04/18 0913 02/04/18 1000 02/04/18 2018  Weight: 53.1 kg 95.9 kg 97.8 kg    Telemetry    NSR - Personally Reviewed  ECG    None recent  Physical Exam   GEN: No acute distress.   Neck: No JVD Cardiac: RRR, no murmurs, rubs, or gallops.  Respiratory: Clear to auscultation bilaterally. GI: Soft, nontender,  non-distended  MS: Bilateral leg amputations Neuro:  Nonfocal  Psych: Normal affect   Labs    Chemistry Recent Labs  Lab 02/03/18 0309 02/04/18 0410 02/05/18 0440  NA 130* 130* 130*  K 4.8 4.3 4.3  CL 96* 97* 94*  CO2 20* 23 23  GLUCOSE 198* 229* 104*  BUN 74* 43* 62*  CREATININE 9.77* 6.88* 8.06*  CALCIUM 6.7* 6.6* 6.9*  ALBUMIN 1.9* 1.9* 1.8*  GFRNONAA 5* 8* 6*  GFRAA 6* 9* 7*  ANIONGAP 14 10 13      Hematology Recent Labs  Lab 01/31/18 0350 02/01/18 0419 02/02/18 0426  WBC 9.6 9.3 10.9*  RBC 3.45* 3.21* 3.15*  HGB 9.7* 9.1* 9.0*  HCT 30.1* 28.6* 28.1*  MCV 87.2 89.1 89.2  MCH 28.1 28.3 28.6  MCHC 32.2 31.8 32.0  RDW 14.6 14.6 14.6  PLT 137* 149* 151    Cardiac EnzymesNo results for input(s): TROPONINI in the last 168 hours. No results for input(s): TROPIPOC in the last 168 hours.   BNPNo results for input(s): BNP, PROBNP in the last 168 hours.   DDimer No results for input(s): DDIMER in the last 168 hours.   Radiology    Dg Abd 2  Views  Result Date: 02/03/2018 CLINICAL DATA:  Abdominal pain and distension EXAM: ABDOMEN - 2 VIEW COMPARISON:  January 28, 2018 FINDINGS: Supine and left lateral decubitus images were obtained. There is moderate stool throughout colon. There is no appreciable bowel dilatation or air-fluid levels to suggest bowel obstruction. No evident free air. There are vascular calcifications in the pelvis. Visualized lung bases are clear. IMPRESSION: Moderate stool in colon. No evident bowel obstruction or free air. Visualized lung bases clear. Electronically Signed   By: Lowella Grip III M.D.   On: 02/03/2018 10:06    Cardiac Studies   EF 50-55%  Patient Profile     62 y.o. male with PAF  Assessment & Plan    1) COUmadin for stroke prevention in the setting of PAF.  Continue metoprolol 50 mg BID.  Will increase dose to 75 mg BID.  CONtinue to watch on tele.        For questions or updates, please contact [place] Please consult [email or web address] for contact info under        Signed, Larae Grooms, MD  02/05/2018, 8:29 AM

## 2018-02-05 NOTE — Progress Notes (Signed)
[facility] ROUNDING NOTE   Subjective:   Started on coumadin for PAF.  UOP 121mL/24hr. He is feeling improved this morning.  No orthopnea or dyspnea.  HD today.    Objective:  Vital signs in last 24 hours:  Temp:  [97.6 F (36.4 C)-98.8 F (37.1 C)] 98.8 F (37.1 C) (10/02 0804) Pulse Rate:  [85-99] 90 (10/02 0804) Resp:  [18] 18 (10/02 0804) BP: (134-144)/(70-83) 144/70 (10/02 0804) SpO2:  [99 %-100 %] 99 % (10/02 0804) Weight:  [97.8 kg] 97.8 kg (10/01 2018)  Weight change: -1 kg Filed Weights   02/04/18 0913 02/04/18 1000 02/04/18 2018  Weight: 53.1 kg 95.9 kg 97.8 kg    Intake/Output: I/O last 3 completed shifts: In: 3710 [P.O.:1260; I.V.:10; IV Piggyback:114] Out: 150 [Urine:150]   Intake/Output this shift:  Total I/O In: 300 [P.O.:300] Out: -   CVS- RRR systolic murmur RS-  decreased breath sounds at base ABD-distended abdomen EXT-generalized edema Access - RIJ trialysis   Basic Metabolic Panel: Recent Labs  Lab 01/31/18 0350 02/01/18 0419 02/02/18 0426 02/03/18 0309 02/04/18 0410 02/05/18 0440  NA 131* 132* 130*  130* 130* 130* 130*  K 4.3 3.9 4.4  4.4 4.8 4.3 4.3  CL 99 97* 98  98 96* 97* 94*  CO2 18* 22 21*  21* 20* 23 23  GLUCOSE 251* 263* 251*  251* 198* 229* 104*  BUN 63* 48* 62*  62* 74* 43* 62*  CREATININE 8.34* 6.91* 8.53*  8.50* 9.77* 6.88* 8.06*  CALCIUM 6.4* 7.4* 6.9*  6.9* 6.7* 6.6* 6.9*  MG 2.1  --   --   --   --   --   PHOS 8.5* 6.7* 7.1* 7.2* 5.8* 6.1*    Liver Function Tests: Recent Labs  Lab 02/01/18 0419 02/02/18 0426 02/03/18 0309 02/04/18 0410 02/05/18 0440  ALBUMIN 1.9* 1.9* 1.9* 1.9* 1.8*   No results for input(s): LIPASE, AMYLASE in the last 168 hours. No results for input(s): AMMONIA in the last 168 hours.  CBC: Recent Labs  Lab 01/29/18 1707 01/30/18 0604 01/31/18 0350 02/01/18 0419 02/02/18 0426  WBC 13.1* 11.0* 9.6 9.3 10.9*  HGB 11.6* 11.2* 9.7* 9.1* 9.0*  HCT 34.6*  34.6* 30.1* 28.6* 28.1*  MCV 85.4 86.5 87.2 89.1 89.2  PLT 157 173 137* 149* 151    Cardiac Enzymes: Recent Labs  Lab 01/30/18 0500  CKTOTAL 33*    BNP: Invalid input(s): POCBNP  CBG: Recent Labs  Lab 02/04/18 1618 02/04/18 2018 02/05/18 0018 02/05/18 0424 02/05/18 0807  GLUCAP 267* 217* 192* 114* 86    Microbiology: Results for orders placed or performed during the hospital encounter of 01/27/18  Urine culture     Status: None   Collection Time: 01/28/18  4:29 PM  Result Value Ref Range Status   Specimen Description URINE, CATHETERIZED  Final   Special Requests NONE  Final   Culture   Final    NO GROWTH Performed at [facility], [place] [address] [place], [place] [address]    Report Status 01/29/2018 FINAL  Final    Coagulation Studies: Recent Labs    02/05/18 0440  LABPROT 18.1*  INR 1.51    Urinalysis: No results for input(s): COLORURINE, LABSPEC, PHURINE, GLUCOSEU, HGBUR, BILIRUBINUR, KETONESUR, PROTEINUR, UROBILINOGEN, NITRITE, LEUKOCYTESUR in the last 72 hours.  Invalid input(s): APPERANCEUR    Imaging: No results found.   Medications:   . DAPTOmycin (CUBICIN)  IV 700 mg (02/03/18 2100)   . Chlorhexidine  Gluconate Cloth  6 each Topical [id number]  . Gerhardt's butt cream   Topical TID  . heparin  5,000 Units Subcutaneous Q8H  . insulin aspart  0-5 Units Subcutaneous QHS  . insulin aspart  0-9 Units Subcutaneous TID WC  . insulin aspart  8 Units Subcutaneous TID WC  . insulin glargine  25 Units Subcutaneous QHS  . latanoprost  1 drop Both Eyes QHS  . mouth rinse  15 mL Mouth Rinse BID  . metoprolol tartrate  75 mg Oral BID  . nystatin   Topical BID  . pantoprazole  40 mg Oral Q1200  . polyethylene glycol  17 g Oral Daily  . senna-docusate  1 tablet Oral BID  . warfarin  5 mg Oral ONCE-1800  . Warfarin - Pharmacist Dosing Inpatient   Does not apply q1800   acetaminophen **OR** acetaminophen, clonazepam, ondansetron **OR** ondansetron  (ZOFRAN) IV, sodium chloride flush  Assessment/ Plan:     AKI, severe, oliguric:  Cr 01/22/18 1.13 but 9/23 9.6.  He has no clear nephrotoxic events but working diagnosis is ATN versus AIN from ancef which has been discontinued.  Empiric hydration was not helpful. CT renal stone protocol unrevealing.  He initiated RRT for volume overload.  He remains oliguric.  I think it's very unlikely that this is a glomerulonephritis and a renal biopsy would likely be of limited utility.  If it did show AIN steroids would be contraindicated in the setting of his endocarditis anyway.  Continue to provide supportive care and await renal recovery which is still possible.  HD today - UF goal 2.5L.  Continue to monitor for renal recovery. He has a temporary catheter which will need to be changed to tunneled if he fails to improve - will need to keep in mind he's anticoagulated now.  Atrial fibrillation with RVR:  Currently in NSR with normal BP.  On metoprolol 75 BID. Coumadin started.  Cardiology following.   Anemia:  Hb stable in the 9s but decreased from priors 10-12 last week.  Likely AoCD. Transfuse PRN  Metabolic acidosis Resolved with dialysis.  Oral candidiasis Diflucan ordered started 01/30/2018 to end 02/05/2018.  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 Algis Downs input for infectious disease started 01/30/2018 daptomycin 700 mg every 48 hours (end date 10/21 by my calc)  Diabetes mellitus per primary team   Peripheral vascular disease status post left BKA August 2016 right BKA September 2019  Aortic valve replacement 2010 pericardial tissue valve  Coronary artery bypass graft 09/2008  Hyperlipidemia on statin therapy   LOS: 9 Jannifer Hick A @TODAY @12 :08 PM

## 2018-02-05 NOTE — Progress Notes (Addendum)
Initial Nutrition Assessment  DOCUMENTATION CODES:   Obesity unspecified  INTERVENTION:  Prostat liquid protein PO 30 ml once daily with meals, each supplement provides 100 kcal, 15 grams protein.  NUTRITION DIAGNOSIS:   Increased nutrient needs related to wound healing, acute illness(AKI on HD) as evidenced by estimated needs.  GOAL:   Patient will meet greater than or equal to 90% of their needs  MONITOR:   PO intake, Diet advancement, Skin, Labs, I & O's  REASON FOR ASSESSMENT:   LOS: 9   ASSESSMENT:   [name] is a 62 yo male with PMH of insulin-dependent type 2 diabetes, HTN, CAD, PVD, right BKA 12/2014, left BKA 01/17/2018, endocarditis who is admitted for acute renal failure.   Visited [name] at bedside; lunch tray is present. He is on a renal diet and consuming 100% of meals. When first hospitalized his intake was complicated by troubles eating; he reported it felt like as he chewed bites would "get bigger" in his mouth. He says this was transient and he had no similar troubles before that, or at present. Hospitalization 9/11 for sepsis r/t right foot osteomyelitis, 9/13 right BKA, treated for staph bacteremia, d/c to SNF [facility] 9/18-9/23, when readmitted for elevated creatinine, ceased urine output. Nephrology following; temp cath placed,  9/25 started HD as needed. MD hopeful about recovery of renal function.   Patient lived at home alone prior to hospitalization 9/11. He reports that he was able to live indepedently (walking, driving) with the unilateral BKA. His usual intake at home included bowl of cereal and juice for breakfast, and microwave meals and sandwiches for lunches/dinners "whatever I can find to make out of the fridge". He endorses good compliance with diabetes management at home.    He reports that he had drank oral nutrition supplements many years ago but that they "didn't sit well with him" and had a laxative effect.   Patient reports his usual  body weight around 200 lbs. Weights from 9/27-10/1 which report his wt as 115-119 lbs are likely incorrect. Patient has edema on upper and lower extremities, difficult to decipher true weight loss/gain. Per chart, right BKA is healing well; would likely still benefit from one Pro-stat daily to supply extra protein.  Medications reviewed and include: SSI, Lantus, Protonix, senna, Miralax Labs reviewed: CBG 72-217, Sodium 130, PO4 6.1, GFR 6   NUTRITION - FOCUSED PHYSICAL EXAM:    Most Recent Value  Orbital Region  No depletion  Upper Arm Region  Mild depletion  Thoracic and Lumbar Region  No depletion  Buccal Region  Mild depletion  Temple Region  Moderate depletion  Clavicle Bone Region  No depletion  Clavicle and Acromion Bone Region  No depletion  Scapular Bone Region  No depletion  Dorsal Hand  No depletion  Patellar Region  No depletion  Anterior Thigh Region  No depletion  Posterior Calf Region  Unable to assess  Edema (RD Assessment)  Mild  Hair  Reviewed  Eyes  Reviewed  Mouth  Reviewed [poor dentition]  Skin  Reviewed  Nails  Reviewed      Diet Order:   Diet Order            Diet renal with fluid restriction Room service appropriate? Yes; Fluid consistency: Thin  Diet effective now              EDUCATION NEEDS:   Education needs have been addressed  Skin:  Skin Assessment: Reviewed RN Assessment(moisture associated skin damage  buttocks groin; right leg incision (amputation 9/13))  Last BM:  9/27  Height:   Ht Readings from Last 1 Encounters:  01/27/18 5\' 10"  (1.778 m)    Weight:   Wt Readings from Last 1 Encounters:  02/04/18 97.8 kg    Ideal Body Weight:  75.5 kg  BMI:  Body mass index is 30.94 kg/m.  Estimated Nutritional Needs:   Kcal:  1700-1900  Protein:  90-100  Fluid:  UOP + 1000 mL   [name], Dietetic Intern [phone]

## 2018-02-05 NOTE — Progress Notes (Addendum)
Inpatient Diabetes Program Recommendations  AACE/ADA: New Consensus Statement on Inpatient Glycemic Control (2015)  Target Ranges:  Prepandial:   less than 140 mg/dL      Peak postprandial:   less than 180 mg/dL (1-2 hours)      Critically ill patients:  140 - 180 mg/dL   Lab Results  Component Value Date   GLUCAP 72 02/05/2018   HGBA1C 8.5 (H) 01/16/2018    Review of Glycemic Control Results for [name], [name] (MRN [id number]) as of 02/05/2018 11:07  Ref. Range 02/04/2018 16:18 02/04/2018 20:18 02/05/2018 00:18 02/05/2018 04:24 02/05/2018 08:07  Glucose-Capillary Latest Ref Range: 70 - 99 mg/dL 267 (H) 217 (H) 192 (H) 114 (H) 72  Diabetes history: Type 2 DM Outpatient Diabetes medications: Toujeo 30 units daily, Novolog 2-10 units tid with meals, Glucotrol XL mg daily, Trulicity 1.5 mg weekly-Admitted from SNF Current orders for Inpatient glycemic control:  Novolog sensitive tid with meals and HS, Novolog 8 units tid with meals, Lantus 25 units q HS Inpatient Diabetes Program Recommendations:    Consider reducing Lantus to 22 units q HS.  Also consider reducing Novolog meal coverage to 6 units tid with meals.  Patient's blood sugars initially low with "acute kidney injury".  Will continue to follow.   Thanks,  Adah Perl, RN, BC-ADM Inpatient Diabetes Coordinator Pager [phone] (8a-5p)

## 2018-02-06 DIAGNOSIS — J9601 Acute respiratory failure with hypoxia: Secondary | ICD-10-CM

## 2018-02-06 LAB — RENAL FUNCTION PANEL
Albumin: 1.8 g/dL — ABNORMAL LOW (ref 3.5–5.0)
Anion gap: 4 — ABNORMAL LOW (ref 5–15)
BUN: 39 mg/dL — ABNORMAL HIGH (ref 8–23)
CO2: 26 mmol/L (ref 22–32)
Calcium: 6.6 mg/dL — ABNORMAL LOW (ref 8.9–10.3)
Chloride: 101 mmol/L (ref 98–111)
Creatinine, Ser: 5.37 mg/dL — ABNORMAL HIGH (ref 0.61–1.24)
GFR calc Af Amer: 12 mL/min — ABNORMAL LOW
GFR calc non Af Amer: 10 mL/min — ABNORMAL LOW
Glucose, Bld: 194 mg/dL — ABNORMAL HIGH (ref 70–99)
Phosphorus: 5.1 mg/dL — ABNORMAL HIGH (ref 2.5–4.6)
Potassium: 4.3 mmol/L (ref 3.5–5.1)
Sodium: 131 mmol/L — ABNORMAL LOW (ref 135–145)

## 2018-02-06 LAB — GLUCOSE, CAPILLARY
GLUCOSE-CAPILLARY: 170 mg/dL — AB (ref 70–99)
GLUCOSE-CAPILLARY: 168 mg/dL — AB (ref 70–99)
Glucose-Capillary: 173 mg/dL — ABNORMAL HIGH (ref 70–99)
Glucose-Capillary: 206 mg/dL — ABNORMAL HIGH (ref 70–99)
Glucose-Capillary: 213 mg/dL — ABNORMAL HIGH (ref 70–99)
Glucose-Capillary: 213 mg/dL — ABNORMAL HIGH (ref 70–99)

## 2018-02-06 LAB — CK: Total CK: 239 U/L (ref 49–397)

## 2018-02-06 LAB — PROTIME-INR
INR: 2.4
PROTHROMBIN TIME: 25.9 s — AB (ref 11.4–15.2)

## 2018-02-06 NOTE — Progress Notes (Addendum)
PROGRESS NOTE  [name] WLN:[id number] DOB: 07-21-1955 DOA: 01/27/2018 PCP: [name], MD  Brief Narrative: 62 year old man complicated PMH hospitalized September for sepsis secondary to right foot osteomyelitis, underwent BKA, found to have MSSA bacteremia with splenic infarct and tricuspid valve endocarditis, discharged on Ancef.  Developed acute oliguria and presented to the emergency department where he was found to have acute kidney injury.  Seen by nephrology and infectious disease, antibiotics changed to daptomycin.  Developed acute respiratory failure necessitating transfer to ICU and critical care service.  Temporary dialysis catheter was placed and the patient was started on hemodialysis.  Respiratory status improved and he was transferred back out to the hospital service.  Assessment/Plan AKI, oliguric, no clear nephrotoxic events, working diagnosis ATN versus AIN from Ancef.  CT renal stone protocol unrevealing.   --Continue management per nephrology.  Hopeful for recovery.  PAF with rapid ventricular response, new diagnosis --Stable.  Continue metoprolol at current dosing.  Continue warfarin for pharmacy.  INR therapeutic. --Follow-up with Dr. Percival Spanish 3-4 weeks after hospitalization.  Acute hypoxic respiratory failure secondary to volume overload, pulmonary edema --Improved status post hemodialysis.  Wean to room air as tolerated.  Diabetes mellitus type 2, peripheral vascular disease status post bilateral BKA --Blood sugars stable.  Continue Lantus, NovoLog meal coverage, sliding scale insulin.   -- Follow-up with Dr. Sharol Given in 1-2 weeks for staple removal.  MSSA bacteremia, tricuspid valve endocarditis, splenic infarct diagnosed 9/17 --Continue daptomycin per infectious disease  Oral candidiasis --Treated with fluconazole  Thrombocytopenia, stable.  Seen September 2019 as well.  Follow.  Anemia of critical illness, stable.  Obesity --per  dietician  Loss of cortex along the anterior inferior aspect of the T12 vertebral body as well as lucency in the posterior aspect of the L1 vertebral body. Question a degree of discitis/osteomyelitis in this region. This finding may warrant MR pre and post-contrast to further evaluate with respect to discitis/bony destruction. --Discussed with Dr. [name].  No change to therapy recommended.  Continue daptomycin.  Aortic atherosclerosis --Follow-up as an outpatient   Discontinue telemetry (sinus rhythm).  Downgrade to [place].  DVT prophylaxis: warfarin Code Status: Full Family Communication: none Disposition Plan: pending    Raymond Hodgkins, MD  [facility] Direct contact: [phone] --Via amion app OR  --[email or web address]; password [email or web address]  7PM-7AM contact night coverage as above 02/06/2018, 4:19 PM  LOS: 10 days   Consultants:  PCCM  Nephrology  ID  Cardiology   Procedures:  Temporary HD catheter  HD  Antimicrobials:  Daptomycin   Interval history/Subjective: Stomach upset today; some diarrhea.  Objective: Vitals:  Vitals:   02/06/18 0756 02/06/18 1617  BP: (!) 142/73 126/67  Pulse: 86 91  Resp: 18 18  Temp: 98.7 F (37.1 C)   SpO2: 99% 98%    Exam: Constitutional:   . Appears calm; mildly uncomfortable Respiratory:  . CTA bilaterally, no w/r/r.  . Respiratory effort normal. Cardiovascular:  . RRR with diastolic murmur, no r/g Abdomen:  . Soft, mildly distended, minimal tenderness Psychiatric:  . Mental status o Mood, affect appropriate  I have personally reviewed the following:   Data:  CBG stable  BMP noted  INR 2.40  Scheduled Meds: . Chlorhexidine Gluconate Cloth  6 each Topical Q0600  . feeding supplement (PRO-STAT SUGAR FREE 64)  30 mL Oral Daily  . Gerhardt's butt cream   Topical TID  . insulin aspart  0-5 Units Subcutaneous QHS  . insulin aspart  0-9 Units Subcutaneous TID WC  .  insulin aspart  4 Units  Subcutaneous TID WC  . insulin glargine  22 Units Subcutaneous QHS  . latanoprost  1 drop Both Eyes QHS  . mouth rinse  15 mL Mouth Rinse BID  . metoprolol tartrate  75 mg Oral BID  . nystatin   Topical BID  . pantoprazole  40 mg Oral Q1200  . polyethylene glycol  17 g Oral Daily  . senna-docusate  1 tablet Oral BID  . Warfarin - Pharmacist Dosing Inpatient   Does not apply q1800   Continuous Infusions: . DAPTOmycin (CUBICIN)  IV 700 mg (02/05/18 2316)    Principal Problem:   Acute renal failure (ARF) (HCC) Active Problems:   Insulin-requiring or dependent type II diabetes mellitus ([place])   Essential hypertension   CAD (coronary artery disease)   Leukocytosis   Thrombocytopenia (HCC)   S/P BKA (below knee amputation) unilateral, right (HCC)   Endocarditis of tricuspid valve   Hyponatremia   PAF (paroxysmal atrial fibrillation) ([place])   Aortic atherosclerosis ([place])   Acute hypoxemic respiratory failure ([place])   LOS: 10 days

## 2018-02-06 NOTE — Discharge Instructions (Addendum)
Information on my medicine - Coumadin®   (Warfarin) ° °Why was Coumadin prescribed for you? °Coumadin was prescribed for you because you have a blood clot or a medical condition that can cause an increased risk of forming blood clots. Blood clots can cause serious health problems by blocking the flow of blood to the heart, lung, or brain. Coumadin can prevent harmful blood clots from forming. °As a reminder your indication for Coumadin is:   Stroke Prevention Because Of Atrial Fibrillation ° °What test will check on my response to Coumadin? °While on Coumadin (warfarin) you will need to have an INR test regularly to ensure that your dose is keeping you in the desired range. The INR (international normalized ratio) number is calculated from the result of the laboratory test called prothrombin time (PT). ° °If an INR APPOINTMENT HAS NOT ALREADY BEEN MADE FOR YOU please schedule an appointment to have this lab work done by your health care provider within 7 days. °Your INR goal is usually a number between:  2 to 3 or your provider may give you a more narrow range like 2-2.5.  Ask your health care provider during an office visit what your goal INR is. ° °What  do you need to  know  About  COUMADIN? °Take Coumadin (warfarin) exactly as prescribed by your healthcare provider about the same time each day.  DO NOT stop taking without talking to the doctor who prescribed the medication.  Stopping without other blood clot prevention medication to take the place of Coumadin may increase your risk of developing a new clot or stroke.  Get refills before you run out. ° °What do you do if you miss a dose? °If you miss a dose, take it as soon as you remember on the same day then continue your regularly scheduled regimen the next day.  Do not take two doses of Coumadin at the same time. ° °Important Safety Information °A possible side effect of Coumadin (Warfarin) is an increased risk of bleeding. You should call your healthcare  provider right away if you experience any of the following: °? Bleeding from an injury or your nose that does not stop. °? Unusual colored urine (red or dark brown) or unusual colored stools (red or black). °? Unusual bruising for unknown reasons. °? A serious fall or if you hit your head (even if there is no bleeding). ° °Some foods or medicines interact with Coumadin® (warfarin) and might alter your response to warfarin. To help avoid this: °? Eat a balanced diet, maintaining a consistent amount of Vitamin K. °? Notify your provider about major diet changes you plan to make. °? Avoid alcohol or limit your intake to 1 drink for women and 2 drinks for men per day. °(1 drink is 5 oz. wine, 12 oz. beer, or 1.5 oz. liquor.) ° °Make sure that ANY health care provider who prescribes medication for you knows that you are taking Coumadin (warfarin).  Also make sure the healthcare provider who is monitoring your Coumadin knows when you have started a new medication including herbals and non-prescription products. ° °Coumadin® (Warfarin)  Major Drug Interactions  °Increased Warfarin Effect Decreased Warfarin Effect  °Alcohol (large quantities) °Antibiotics (esp. Septra/Bactrim, Flagyl, Cipro) °Amiodarone (Cordarone) °Aspirin (ASA) °Cimetidine (Tagamet) °Megestrol (Megace) °NSAIDs (ibuprofen, naproxen, etc.) °Piroxicam (Feldene) °Propafenone (Rythmol SR) °Propranolol (Inderal) °Isoniazid (INH) °Posaconazole (Noxafil) Barbiturates (Phenobarbital) °Carbamazepine (Tegretol) °Chlordiazepoxide (Librium) °Cholestyramine (Questran) °Griseofulvin °Oral Contraceptives °Rifampin °Sucralfate (Carafate) °Vitamin K  ° °Coumadin® (Warfarin) Major Herbal   Interactions  Increased Warfarin Effect Decreased Warfarin Effect  Garlic Ginseng Ginkgo biloba Coenzyme Q10 Green tea St. Johns wort    Coumadin (Warfarin) FOOD Interactions  Eat a consistent number of servings per week of foods HIGH in Vitamin K (1 serving =  cup)  Collards  (cooked, or boiled & drained) Kale (cooked, or boiled & drained) Mustard greens (cooked, or boiled & drained) Parsley *serving size only =  cup Spinach (cooked, or boiled & drained) Swiss chard (cooked, or boiled & drained) Turnip greens (cooked, or boiled & drained)  Eat a consistent number of servings per week of foods MEDIUM-HIGH in Vitamin K (1 serving = 1 cup)  Asparagus (cooked, or boiled & drained) Broccoli (cooked, boiled & drained, or raw & chopped) Brussel sprouts (cooked, or boiled & drained) *serving size only =  cup Lettuce, raw (green leaf, endive, romaine) Spinach, raw Turnip greens, raw & chopped   These websites have more information on Coumadin (warfarin):  FailFactory.se; VeganReport.com.au;  [employer]  Please arrive at least 30 min before your appointment to complete your check in paperwork.  If you are unable to arrive 30 min prior to your appointment time we may have to cancel or reschedule you.  Empty and recharge JP drain as needed and document daily output and bring it with you to your follow up surgical appointment.  LAPAROSCOPIC SURGERY: POST OP INSTRUCTIONS Always review your discharge instruction sheet given to you by the facility where your surgery was performed. IF YOU HAVE DISABILITY OR FAMILY LEAVE FORMS, YOU MUST BRING THEM TO THE OFFICE FOR PROCESSING.   DO NOT GIVE THEM TO YOUR DOCTOR.  PAIN CONTROL  1. First take acetaminophen (Tylenol) AND/or ibuprofen (Advil) to control your pain after surgery.  Follow directions on package.  Taking acetaminophen (Tylenol) and/or ibuprofen (Advil) regularly after surgery will help to control your pain and lower the amount of prescription pain medication you may need.  You should not take more than 4,000 mg (4 grams) of acetaminophen (Tylenol) in 24 hours.  You should not take ibuprofen (Advil), aleve, motrin, naprosyn or other NSAIDS if you have a history of stomach  ulcers or chronic kidney disease.  2. A prescription for pain medication may be given to you upon discharge.  Take your pain medication as prescribed, if you still have uncontrolled pain after taking acetaminophen (Tylenol) or ibuprofen (Advil). 3. Use ice packs to help control pain. 4. If you need a refill on your pain medication, please contact your pharmacy.  They will contact our office to request authorization. Prescriptions will not be filled after 5pm or on week-ends.  HOME MEDICATIONS 5. Take your usually prescribed medications unless otherwise directed.  DIET 6. You should follow a light diet the first few days after arrival home.  Be sure to include lots of fluids daily. Avoid fatty, fried foods.   CONSTIPATION 7. It is common to experience some constipation after surgery and if you are taking pain medication.  Increasing fluid intake and taking a stool softener (such as Colace) will usually help or prevent this problem from occurring.  A mild laxative (Milk of Magnesia or Miralax) should be taken according to package instructions if there are no bowel movements after 48 hours.  WOUND/INCISION CARE 8. Most patients will experience some swelling and bruising in the area of the incisions.  Ice packs will help.  Swelling and bruising can take several days to resolve.  9. Unless discharge instructions indicate  otherwise, follow guidelines below  a. STERI-STRIPS - you may remove your outer bandages 48 hours after surgery, and you may shower at that time.  You have steri-strips (small skin tapes) in place directly over the incision.  These strips should be left on the skin for 7-10 days.   b. DERMABOND/SKIN GLUE - you may shower in 24 hours.  The glue will flake off over the next 2-3 weeks. 10. Any sutures or staples will be removed at the office during your follow-up visit.  ACTIVITIES 11. You may resume regular (light) daily activities beginning the next day--such as daily self-care,  walking, climbing stairs--gradually increasing activities as tolerated.  You may have sexual intercourse when it is comfortable.  Refrain from any heavy lifting or straining until approved by your doctor. a. You may drive when you are no longer taking prescription pain medication, you can comfortably wear a seatbelt, and you can safely maneuver your car and apply brakes.  FOLLOW-UP 12. You should see your doctor in the office for a follow-up appointment approximately 2-3 weeks after your surgery.  You should have been given your post-op/follow-up appointment when your surgery was scheduled.  If you did not receive a post-op/follow-up appointment, make sure that you call for this appointment within a day or two after you arrive home to insure a convenient appointment time.   WHEN TO CALL YOUR DOCTOR: 1. Fever over 101.0 2. Inability to urinate 3. Continued bleeding from incision. 4. Increased pain, redness, or drainage from the incision. 5. Increasing abdominal pain  The clinic staff is available to answer your questions during regular business hours.  Please dont hesitate to call and ask to speak to one of the nurses for clinical concerns.  If you have a medical emergency, go to the nearest emergency room or call 911.  A surgeon from [facility] is always on call at the hospital. [address], [place], [place], [place]  [address] ? [address], [place], [place]   [address] [phone] ? [phone] ? FAX [phone]

## 2018-02-06 NOTE — Progress Notes (Signed)
[facility] ROUNDING NOTE   Subjective:   HD no issues yesterday.  No UOP documented, has about 229mL in foley right now.  C/o GI upset and loose stool x 1    Objective:  Vital signs in last 24 hours:  Temp:  [98 F (36.7 C)-99.4 F (37.4 C)] 98.9 F (37.2 C) (10/03 0436) Pulse Rate:  [59-102] 86 (10/03 0756) Resp:  [16-18] 18 (10/03 0756) BP: (123-160)/(64-84) 142/73 (10/03 0756) SpO2:  [97 %-100 %] 99 % (10/03 0756) Weight:  [94.4 kg-97.4 kg] 94.4 kg (10/02 2026)  Weight change: 44.3 kg Filed Weights   02/05/18 1345 02/05/18 1721 02/05/18 2026  Weight: 97.4 kg 94.4 kg 94.4 kg    Intake/Output: I/O last 3 completed shifts: In: 330 [P.O.:300; I.V.:30] Out: 2500 [Other:2500]   Intake/Output this shift:  Total I/O In: 300 [P.O.:300] Out: -   CVS- RRR systolic murmur RS-  Normal WOB and clear anteriorly ABD-soft, mod distented abdomen, +BS EXT-generalized edema 1+ Access - RIJ trialysis - dressing coming off due to beard   Basic Metabolic Panel: Recent Labs  Lab 01/31/18 0350  02/02/18 0426 02/03/18 0309 02/04/18 0410 02/05/18 0440 02/06/18 0503  NA 131*   < > 130*  130* 130* 130* 130* 131*  K 4.3   < > 4.4  4.4 4.8 4.3 4.3 4.3  CL 99   < > 98  98 96* 97* 94* 101  CO2 18*   < > 21*  21* 20* 23 23 26   GLUCOSE 251*   < > 251*  251* 198* 229* 104* 194*  BUN 63*   < > 62*  62* 74* 43* 62* 39*  CREATININE 8.34*   < > 8.53*  8.50* 9.77* 6.88* 8.06* 5.37*  CALCIUM 6.4*   < > 6.9*  6.9* 6.7* 6.6* 6.9* 6.6*  MG 2.1  --   --   --   --   --   --   PHOS 8.5*   < > 7.1* 7.2* 5.8* 6.1* 5.1*   < > = values in this interval not displayed.    Liver Function Tests: Recent Labs  Lab 02/02/18 0426 02/03/18 0309 02/04/18 0410 02/05/18 0440 02/06/18 0503  ALBUMIN 1.9* 1.9* 1.9* 1.8* 1.8*   No results for input(s): LIPASE, AMYLASE in the last 168 hours. No results for input(s): AMMONIA in the last 168 hours.  CBC: Recent Labs  Lab  01/31/18 0350 02/01/18 0419 02/02/18 0426 02/05/18 1430  WBC 9.6 9.3 10.9* 10.2  HGB 9.7* 9.1* 9.0* 8.8*  HCT 30.1* 28.6* 28.1* 27.5*  MCV 87.2 89.1 89.2 90.5  PLT 137* 149* 151 121*    Cardiac Enzymes: Recent Labs  Lab 02/06/18 0503  CKTOTAL 239    BNP: Invalid input(s): POCBNP  CBG: Recent Labs  Lab 02/05/18 1802 02/05/18 2026 02/06/18 0013 02/06/18 0435 02/06/18 0755  GLUCAP 145* 199* 206* 170* 173*    Microbiology: Results for orders placed or performed during the hospital encounter of 01/27/18  Urine culture     Status: None   Collection Time: 01/28/18  4:29 PM  Result Value Ref Range Status   Specimen Description URINE, CATHETERIZED  Final   Special Requests NONE  Final   Culture   Final    NO GROWTH Performed at [facility], [place] [address] [place], [place] [address]    Report Status 01/29/2018 FINAL  Final    Coagulation Studies: Recent Labs    02/05/18 0440 02/06/18 0503  LABPROT 18.1* 25.9*  INR 1.51 2.40    Urinalysis: No results for input(s): COLORURINE, LABSPEC, PHURINE, GLUCOSEU, HGBUR, BILIRUBINUR, KETONESUR, PROTEINUR, UROBILINOGEN, NITRITE, LEUKOCYTESUR in the last 72 hours.  Invalid input(s): APPERANCEUR    Imaging: No results found.   Medications:   . DAPTOmycin (CUBICIN)  IV 700 mg (02/05/18 2316)   . Chlorhexidine Gluconate Cloth  6 each Topical Q0600  . feeding supplement (PRO-STAT SUGAR FREE 64)  30 mL Oral Daily  . Gerhardt's butt cream   Topical TID  . insulin aspart  0-5 Units Subcutaneous QHS  . insulin aspart  0-9 Units Subcutaneous TID WC  . insulin aspart  4 Units Subcutaneous TID WC  . insulin glargine  22 Units Subcutaneous QHS  . latanoprost  1 drop Both Eyes QHS  . mouth rinse  15 mL Mouth Rinse BID  . metoprolol tartrate  75 mg Oral BID  . nystatin   Topical BID  . pantoprazole  40 mg Oral Q1200  . polyethylene glycol  17 g Oral Daily  . senna-docusate  1 tablet Oral BID  . Warfarin -  Pharmacist Dosing Inpatient   Does not apply q1800   acetaminophen **OR** acetaminophen, clonazepam, ondansetron **OR** ondansetron (ZOFRAN) IV, sodium chloride flush  Assessment/ Plan:     AKI, severe, oliguric:  Cr 01/22/18 1.13 but 9/23 9.6.  He has no clear nephrotoxic events but working diagnosis is ATN versus AIN from ancef which has been discontinued.  Empiric hydration was not helpful. CT renal stone protocol unrevealing.  He initiated RRT for volume overload.  He remains oliguric.  I think it's very unlikely that this is a glomerulonephritis and a renal biopsy would likely be of limited utility.  If it did show AIN steroids would be contraindicated in the setting of his endocarditis anyway.  Continue to provide supportive care and await renal recovery which is still possible.  HD tomorrow- UF goal 2.5-3L.  Continue to monitor for renal recovery. He has a temporary catheter which will need to be changed to tunneled if he fails to improve - will need to keep in mind he's anticoagulated now.  Asked RN to redress his catheter this AM as the dressing is peeling off  Atrial fibrillation with RVR:  Currently in NSR with normal BP.  On metoprolol 75 BID. Coumadin started.  Cardiology following.   Anemia:  Hb has been stable in the 9s but decreased from priors 10-12 last week.  Likely AoCD. Transfuse PRN  Metabolic acidosis Resolved with dialysis.  Oral candidiasis Diflucan ordered started 01/30/2018 and ended 02/05/2018.  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

## 2018-02-06 NOTE — Progress Notes (Addendum)
Progress Note  Patient Name: [name] Date of Encounter: 02/06/2018  Primary Cardiologist: Minus Breeding, MD   Subjective   No chest pain, dyspnea or palpitations.   Inpatient Medications    Scheduled Meds: . Chlorhexidine Gluconate Cloth  6 each Topical Q0600  . feeding supplement (PRO-STAT SUGAR FREE 64)  30 mL Oral Daily  . Gerhardt's butt cream   Topical TID  . insulin aspart  0-5 Units Subcutaneous QHS  . insulin aspart  0-9 Units Subcutaneous TID WC  . insulin aspart  4 Units Subcutaneous TID WC  . insulin glargine  22 Units Subcutaneous QHS  . latanoprost  1 drop Both Eyes QHS  . mouth rinse  15 mL Mouth Rinse BID  . metoprolol tartrate  75 mg Oral BID  . nystatin   Topical BID  . pantoprazole  40 mg Oral Q1200  . polyethylene glycol  17 g Oral Daily  . senna-docusate  1 tablet Oral BID  . Warfarin - Pharmacist Dosing Inpatient   Does not apply q1800   Continuous Infusions: . DAPTOmycin (CUBICIN)  IV 700 mg (02/05/18 2316)   PRN Meds: acetaminophen **OR** acetaminophen, clonazepam, ondansetron **OR** ondansetron (ZOFRAN) IV, sodium chloride flush   Vital Signs    Vitals:   02/05/18 1759 02/05/18 2026 02/06/18 0436 02/06/18 0756  BP: (!) 144/80 (!) 147/78 127/79 (!) 142/73  Pulse: (!) 102 (!) 59 82 86  Resp: 18 18 16 18   Temp: 99.4 F (37.4 C) 99.4 F (37.4 C) 98.9 F (37.2 C)   TempSrc: Oral Oral    SpO2: 98% 99% 100% 99%  Weight:  94.4 kg    Height:        Intake/Output Summary (Last 24 hours) at 02/06/2018 0829 Last data filed at 02/06/2018 0513 Gross per 24 hour  Intake 320 ml  Output 2500 ml  Net -2180 ml   Filed Weights   02/05/18 1345 02/05/18 1721 02/05/18 2026  Weight: 97.4 kg 94.4 kg 94.4 kg    Telemetry    Sr with improving rate of 90-80s - Personally Reviewed  ECG    N/A  Physical Exam   GEN: No acute distress.   Neck: No JVD Cardiac: RRR, no murmurs, rubs, or gallops.  Respiratory: Clear to auscultation  bilaterally. GI: Soft, nontender, non-distended  MS: S/p Bilateral BKA Neuro:  Nonfocal  Psych: Normal affect   Labs    Chemistry Recent Labs  Lab 02/04/18 0410 02/05/18 0440 02/06/18 0503  NA 130* 130* 131*  K 4.3 4.3 4.3  CL 97* 94* 101  CO2 23 23 26   GLUCOSE 229* 104* 194*  BUN 43* 62* 39*  CREATININE 6.88* 8.06* 5.37*  CALCIUM 6.6* 6.9* 6.6*  ALBUMIN 1.9* 1.8* 1.8*  GFRNONAA 8* 6* 10*  GFRAA 9* 7* 12*  ANIONGAP 10 13 4*     Hematology Recent Labs  Lab 02/01/18 0419 02/02/18 0426 02/05/18 1430  WBC 9.3 10.9* 10.2  RBC 3.21* 3.15* 3.04*  HGB 9.1* 9.0* 8.8*  HCT 28.6* 28.1* 27.5*  MCV 89.1 89.2 90.5  MCH 28.3 28.6 28.9  MCHC 31.8 32.0 32.0  RDW 14.6 14.6 14.1  PLT 149* 151 121*    Radiology    No results found.  Cardiac Studies   TEE 01/21/18 Study Conclusions  - Left ventricle: Systolic function was normal. The estimated   ejection fraction was in the range of 50% to 55%. Wall motion was   normal; there were no regional wall motion  abnormalities. - Aortic valve: A prosthesis was present and functioning normally.   The prosthesis had a normal range of motion. The sewing ring   appeared normal, had no rocking motion, and showed no evidence of   dehiscence. - Mitral valve: Mildly calcified annulus. - Left atrium: No evidence of thrombus in the atrial cavity or   appendage. - Right atrium: No evidence of thrombus in the atrial cavity or   appendage. - Atrial septum: There was redundancy of the septum, with   borderline criteria for aneurysm. - Tricuspid valve: There was a vegetation.  Impressions:  - Bioprosthetic AVR does not appear to have vegetations present.   Functioning normally, no significant regurgitation.     There is an independently mobile echodensity seen in the right   ventricle consistent with a vegetation. It appears to be located   on the tricuspid valve. This is consistent with endocarditis   given his known MSSA  bacteremia.  Patient Profile     62 y.o. 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   Assessment & Plan    1. PAF - Maintaining sinus rhythm. HR now stable at 80-90s. Continue metoprolol at 75mg  BID. Coumadin per pharmacy.   [facility] will sign off.   Medication Recommendations:  Continue metoprolol, up-titrate as needed for better rate control (likely drive by underlying issue) Other recommendations (labs, testing, etc):  None Follow up as an outpatient:  With Dr. Percival Spanish 3-4 weeks post hospital   For questions or updates, please contact [facility] Please consult [email or web address] for contact info under        Signed, Leanor Kail, PA  02/06/2018, 8:29 AM    I have examined the patient and reviewed assessment and plan and discussed with patient.  Agree with above as stated.  Coumadin for stroke prevention in the setting of ARF.  Larae Grooms

## 2018-02-06 NOTE — Progress Notes (Signed)
[facility] for warfarin  Indication: atrial fibrillation  Allergies  Allergen Reactions  . Ace Inhibitors Cough    Patient Measurements: Height: 5\' 10"  (177.8 cm) Weight: 208 lb 1.8 oz (94.4 kg) IBW/kg (Calculated) : 73   Vital Signs: Temp: 98.9 F (37.2 C) (10/03 0436) Temp Source: Oral (10/02 2026) BP: 127/79 (10/03 0436) Pulse Rate: 82 (10/03 0436)  Labs: Recent Labs    02/04/18 0410 02/05/18 0440 02/05/18 1430 02/06/18 0503  HGB  --   --  8.8*  --   HCT  --   --  27.5*  --   PLT  --   --  121*  --   LABPROT  --  18.1*  --  25.9*  INR  --  1.51  --  2.40  CREATININE 6.88* 8.06*  --  5.37*  CKTOTAL  --   --   --  239    Estimated Creatinine Clearance: 16.7 mL/min (A) (by C-G formula based on SCr of 5.37 mg/dL (H)).   Medical History: Past Medical History:  Diagnosis Date  . Bicuspid aortic valve    a. 02/2009 s/p tissue AVR;  b. 07/2014 Echo: EF 55-60%, basal-mid inf HK, mild AS/MR, mod dil LA, PASP 44 mmHg.  Marland Kitchen CAD (coronary artery disease) October 2010   a. 02/2009 NSTEMI/Cath: 3VD; b. 02/2009 CABG x 5: LIMA->LAD, VG->Diag, VG->RI, VG->RVM->RPDA.  Marland Kitchen GERD (gastroesophageal reflux disease)   . Heart murmur   . Hyperlipidemia   . Hypertension   . Myocardial infarction ([place]) 2010  . PVD (peripheral vascular disease) ([place])    a. s/p toe amputations on L foot.  . Type II diabetes mellitus ([place])     Assessment: 62 yo M with new onset PAF to start on warfarin. CBC relatively stable. No previous anticoagulation noted. Large jump in INR today from 1.51 to 2.40. This is likely due to the residual effect of the fluconazole interaction.   Goal of Therapy:  INR 2-3 Monitor platelets by anticoagulation protocol: Yes   Plan:  1. Hold warfarin today 2. Daily INR 3. D/C Heparin subq   Alencia Gordon A. Levada Dy, PharmD, [place] Pager: [phone] Please utilize Amion for appropriate phone number to reach  the unit pharmacist ([place])   02/06/2018, 7:36 AM

## 2018-02-07 LAB — GLUCOSE, CAPILLARY
GLUCOSE-CAPILLARY: 106 mg/dL — AB (ref 70–99)
GLUCOSE-CAPILLARY: 183 mg/dL — AB (ref 70–99)
GLUCOSE-CAPILLARY: 215 mg/dL — AB (ref 70–99)
Glucose-Capillary: 229 mg/dL — ABNORMAL HIGH (ref 70–99)

## 2018-02-07 LAB — RENAL FUNCTION PANEL
Albumin: 2 g/dL — ABNORMAL LOW (ref 3.5–5.0)
Anion gap: 11 (ref 5–15)
BUN: 57 mg/dL — ABNORMAL HIGH (ref 8–23)
CHLORIDE: 97 mmol/L — AB (ref 98–111)
CO2: 24 mmol/L (ref 22–32)
Calcium: 7 mg/dL — ABNORMAL LOW (ref 8.9–10.3)
Creatinine, Ser: 7.01 mg/dL — ABNORMAL HIGH (ref 0.61–1.24)
GFR calc non Af Amer: 8 mL/min — ABNORMAL LOW (ref >60)
GFR, EST AFRICAN AMERICAN: 9 mL/min — AB (ref >60)
Glucose, Bld: 168 mg/dL — ABNORMAL HIGH (ref 70–99)
PHOSPHORUS: 5.9 mg/dL — AB (ref 2.5–4.6)
POTASSIUM: 4.6 mmol/L (ref 3.5–5.1)
SODIUM: 132 mmol/L — AB (ref 135–145)

## 2018-02-07 LAB — PROTIME-INR
INR: 2.34
PROTHROMBIN TIME: 25.4 s — AB (ref 11.4–15.2)

## 2018-02-07 MED ORDER — SODIUM CHLORIDE 0.9 % IV SOLN
INTRAVENOUS | Status: DC | PRN
  Administered 2018-02-07 – 2018-02-16 (×5): 250 mL via INTRAVENOUS
  Administered 2018-02-19: 500 mL via INTRAVENOUS
  Administered 2018-02-26: 1000 mL via INTRAVENOUS
  Administered 2018-02-28 (×2): via INTRAVENOUS

## 2018-02-07 MED ORDER — TRAZODONE HCL 50 MG PO TABS
50.0000 mg | ORAL_TABLET | Freq: Once | ORAL | Status: AC
  Administered 2018-02-07: 50 mg via ORAL
  Filled 2018-02-07: qty 1

## 2018-02-07 MED ORDER — WARFARIN SODIUM 2.5 MG PO TABS
2.5000 mg | ORAL_TABLET | Freq: Once | ORAL | Status: AC
  Administered 2018-02-07: 2.5 mg via ORAL
  Filled 2018-02-07: qty 1

## 2018-02-07 MED ORDER — HEPARIN SODIUM (PORCINE) 1000 UNIT/ML IJ SOLN
INTRAMUSCULAR | Status: AC
  Administered 2018-02-07: 2400 [IU]
  Filled 2018-02-07: qty 3

## 2018-02-07 NOTE — Progress Notes (Signed)
[name]   Subjective:   HD today.  Has 334mL UOP so far today. He has no complaints.     Objective:  Vital signs in last 24 hours:  Temp:  [97.9 F (36.6 C)-98.9 F (37.2 C)] 98.6 F (37 C) (10/04 1123) Pulse Rate:  [81-99] 91 (10/04 1123) Resp:  [16-18] 18 (10/04 1123) BP: (123-155)/(61-86) 146/75 (10/04 1123) SpO2:  [98 %-100 %] 98 % (10/04 1123) Weight:  [92.5 kg-96 kg] 92.5 kg (10/04 1038)  Weight change: -3 kg Filed Weights   02/06/18 2031 02/07/18 0655 02/07/18 1038  Weight: 94.4 kg 96 kg 92.5 kg    Intake/Output: I/O last 3 completed shifts: In: 34 [P.O.:860; I.V.:20] Out: 100 [Urine:100]   Intake/Output this shift:  Total I/O In: -  Out: 3300 [Urine:300; LTJQZ:0092]  CVS- RRR systolic murmur RS-  Normal WOB and clear anteriorly ABD-soft, mod distented abdomen, +BS EXT-generalized edema trace to 1+ Access - RIJ trialysis   Basic Metabolic Panel: Recent Labs  Lab 02/03/18 0309 02/04/18 0410 02/05/18 0440 02/06/18 0503 02/07/18 0400  NA 130* 130* 130* 131* 132*  K 4.8 4.3 4.3 4.3 4.6  CL 96* 97* 94* 101 97*  CO2 20* 23 23 26 24   GLUCOSE 198* 229* 104* 194* 168*  BUN 74* 43* 62* 39* 57*  CREATININE 9.77* 6.88* 8.06* 5.37* 7.01*  CALCIUM 6.7* 6.6* 6.9* 6.6* 7.0*  PHOS 7.2* 5.8* 6.1* 5.1* 5.9*    Liver Function Tests: Recent Labs  Lab 02/03/18 0309 02/04/18 0410 02/05/18 0440 02/06/18 0503 02/07/18 0400  ALBUMIN 1.9* 1.9* 1.8* 1.8* 2.0*   No results for input(s): LIPASE, AMYLASE in the last 168 hours. No results for input(s): AMMONIA in the last 168 hours.  CBC: Recent Labs  Lab 02/01/18 0419 02/02/18 0426 02/05/18 1430  WBC 9.3 10.9* 10.2  HGB 9.1* 9.0* 8.8*  HCT 28.6* 28.1* 27.5*  MCV 89.1 89.2 90.5  PLT 149* 151 121*    Cardiac Enzymes: Recent Labs  Lab 02/06/18 0503  CKTOTAL 239    BNP: Invalid input(s): POCBNP  CBG: Recent Labs  Lab 02/06/18 1132 02/06/18 1617  02/06/18 2031 02/07/18 0011 02/07/18 1119  GLUCAP 213* 213* 168* 183* 106*    Microbiology: Results for orders placed or performed during the hospital encounter of 01/27/18  Urine culture     Status: None   Collection Time: 01/28/18  4:29 PM  Result Value Ref Range Status   Specimen Description URINE, CATHETERIZED  Final   Special Requests NONE  Final   Culture   Final    NO GROWTH Performed at [facility], [place] [address] [place], [place] [address]    Report Status 01/29/2018 FINAL  Final    Coagulation Studies: Recent Labs    02/05/18 0440 02/06/18 0503 02/07/18 0400  LABPROT 18.1* 25.9* 25.4*  INR 1.51 2.40 2.34    Urinalysis: No results for input(s): COLORURINE, LABSPEC, PHURINE, GLUCOSEU, HGBUR, BILIRUBINUR, KETONESUR, PROTEINUR, UROBILINOGEN, NITRITE, LEUKOCYTESUR in the last 72 hours.  Invalid input(s): APPERANCEUR    Imaging: No results found.   Medications:   . DAPTOmycin (CUBICIN)  IV 700 mg (02/05/18 2316)   . Chlorhexidine Gluconate Cloth  6 each Topical Q0600  . feeding supplement (PRO-STAT SUGAR FREE 64)  30 mL Oral Daily  . Gerhardt's butt cream   Topical TID  . insulin aspart  0-5 Units Subcutaneous QHS  . insulin aspart  0-9 Units Subcutaneous TID WC  . insulin aspart  4 Units Subcutaneous TID WC  . insulin glargine  22 Units Subcutaneous QHS  . latanoprost  1 drop Both Eyes QHS  . mouth rinse  15 mL Mouth Rinse BID  . metoprolol tartrate  75 mg Oral BID  . nystatin   Topical BID  . pantoprazole  40 mg Oral Q1200  . polyethylene glycol  17 g Oral Daily  . senna-docusate  1 tablet Oral BID  . warfarin  2.5 mg Oral ONCE-1800  . Warfarin - Pharmacist Dosing Inpatient   Does not apply q1800   acetaminophen **OR** acetaminophen, clonazepam, ondansetron **OR** ondansetron (ZOFRAN) IV, sodium chloride flush  Assessment/ Plan:     AKI, severe, oliguric:  Cr 01/22/18 1.13 but 9/23 9.6.  He has no clear nephrotoxic events but working  diagnosis is ATN versus AIN from ancef which has been discontinued.  Empiric hydration was not helpful. CT renal stone protocol unrevealing.  He initiated RRT for volume overload.  He remains oliguric.  I think it's very unlikely that this is a glomerulonephritis and a renal biopsy would likely be of limited utility.  If it did show AIN steroids would be contraindicated in the setting of his endocarditis anyway.  Continue to provide supportive care and await renal recovery which is still possible.  HD today - UF 3L.  UOP may be picking up today which is encouraging - Plan to hold HD this weekend and monitor UOP/labs. He has a temporary catheter which will need to be changed to tunneled if he fails to improve - will need to keep in mind he's anticoagulated now.    Atrial fibrillation with RVR:  Currently in NSR with normal BP.  On metoprolol 75 BID. Coumadin started.  Cardiology following.   Anemia:  Hb has been stable in the 9s but decreased from priors 10-12 last week.  Likely AoCD. Transfuse PRN  Metabolic acidosis Resolved with dialysis.  Oral candidiasis Diflucan ordered started 01/30/2018 and ended 02/05/2018.  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 Algis Downs input for infectious disease started 01/30/2018 daptomycin 700 mg every 48 hours (end date 10/21 by my calc)  Diabetes mellitus per primary team   Peripheral vascular disease status post left BKA August 2016 right BKA September 2019  Aortic valve replacement 2010 pericardial tissue valve  Coronary artery bypass graft 09/2008  Hyperlipidemia on statin therapy   LOS: 11 Jannifer Hick A @TODAY @1 :07 PM

## 2018-02-07 NOTE — Clinical Social Work Note (Signed)
CSW continuing to follow patient's progress and notes reviewed. Patient will return to [facility] once medically stable and and [employer] authorization received. CSW will provide SW intervention services as needed and facilitate discharge back to SNF when medically stable.  [name], MSW, LCSW Licensed Clinical Social Worker [place] [phone]

## 2018-02-07 NOTE — Progress Notes (Signed)
PROGRESS NOTE  [name] WHQ:[id number] DOB: May 13, 1955 DOA: 01/27/2018 PCP: [name], MD  Brief Narrative: 62 year old man complicated PMH hospitalized September for sepsis secondary to right foot osteomyelitis, underwent BKA, found to have MSSA bacteremia with splenic infarct and tricuspid valve endocarditis, discharged on Ancef.  Developed acute oliguria and presented to the emergency department where he was found to have acute kidney injury.  Seen by nephrology and infectious disease, antibiotics changed to daptomycin.  Developed acute respiratory failure necessitating transfer to ICU and critical care service.  Temporary dialysis catheter was placed and the patient was started on hemodialysis.  Respiratory status improved and he was transferred back out to the hospital service.  Assessment/Plan AKI, oliguric, no clear nephrotoxic events, working diagnosis ATN versus AIN from Ancef.  CT renal stone protocol unrevealing.   --continue management per nephrology.   PAF with rapid ventricular response, new diagnosis --Stable.  Continue metoprolol at current dosing.  Continue warfarin for pharmacy.  INR therapeutic. --Follow-up with Dr. Percival Spanish 3-4 weeks after hospitalization.  Acute hypoxic respiratory failure secondary to volume overload, pulmonary edema --continue to wean oxygen as tolerated   Diabetes mellitus type 2, peripheral vascular disease status post bilateral BKA --Blood sugars remain stable. Will continue Lantus, NovoLog meal coverage, sliding scale insulin.   -- Follow-up with Dr. Sharol Given in 1 week for staple removal.  MSSA bacteremia, tricuspid valve endocarditis, splenic infarct diagnosed 9/17 --continue daptomycin per infectious disease  Oral candidiasis --Treated with fluconazole  Thrombocytopenia, stable.  Seen September 2019 as well.  Follow.  Anemia of critical illness, stable.  Obesity --per dietician  Loss of cortex along the anterior inferior  aspect of the T12 vertebral body as well as lucency in the posterior aspect of the L1 vertebral body. Question a degree of discitis/osteomyelitis in this region. This finding may warrant MR pre and post-contrast to further evaluate with respect to discitis/bony destruction. --Discussed with Dr. [name].  No change to therapy recommended.  Continue daptomycin.  Aortic atherosclerosis --Follow-up as an outpatient   Continue present management; hopeful for recovery  DVT prophylaxis: warfarin Code Status: Full Family Communication: none Disposition Plan: pending    [name], MD  [facility] Direct contact: [phone] --Via amion app OR  --[email or web address]; password [email or web address]  7PM-7AM contact night coverage as above 02/07/2018, 3:38 PM  LOS: 11 days   Consultants:  PCCM  Nephrology  ID  Cardiology   Procedures:  Temporary HD catheter  HD  Antimicrobials:  Daptomycin   Interval history/Subjective: Tired, slept poorly.  Objective: Vitals:  Vitals:   02/07/18 1038 02/07/18 1123  BP: (!) 150/77 (!) 146/75  Pulse: 88 91  Resp: 18 18  Temp: 97.9 F (36.6 C) 98.6 F (37 C)  SpO2:  98%    Exam: Constitutional:   . Appears calm and mildly uncomfortable Respiratory:  . CTA bilaterally, no w/r/r.  . Respiratory effort normal.  Cardiovascular:  . RRR, 2/6 murmur, no r/g Psychiatric:  . Mental status o Mood, affect appropriate  I have personally reviewed the following:   Data:  CBG stable  BMP noted  INR 2.34  Scheduled Meds: . Chlorhexidine Gluconate Cloth  6 each Topical Q0600  . feeding supplement (PRO-STAT SUGAR FREE 64)  30 mL Oral Daily  . Gerhardt's butt cream   Topical TID  . insulin aspart  0-5 Units Subcutaneous QHS  . insulin aspart  0-9 Units Subcutaneous TID WC  . insulin aspart  4 Units Subcutaneous TID WC  . insulin  glargine  22 Units Subcutaneous QHS  . latanoprost  1 drop Both Eyes QHS  . mouth rinse  15 mL Mouth  Rinse BID  . metoprolol tartrate  75 mg Oral BID  . nystatin   Topical BID  . pantoprazole  40 mg Oral Q1200  . polyethylene glycol  17 g Oral Daily  . senna-docusate  1 tablet Oral BID  . warfarin  2.5 mg Oral ONCE-1800  . Warfarin - Pharmacist Dosing Inpatient   Does not apply q1800   Continuous Infusions: . DAPTOmycin (CUBICIN)  IV 700 mg (02/05/18 2316)    Principal Problem:   Acute renal failure (ARF) (HCC) Active Problems:   Insulin-requiring or dependent type II diabetes mellitus ([place])   Essential hypertension   CAD (coronary artery disease)   Leukocytosis   Thrombocytopenia (HCC)   S/P BKA (below knee amputation) unilateral, right (HCC)   Endocarditis of tricuspid valve   Hyponatremia   PAF (paroxysmal atrial fibrillation) ([place])   Aortic atherosclerosis ([place])   Acute hypoxemic respiratory failure ([place])   LOS: 11 days

## 2018-02-07 NOTE — Progress Notes (Signed)
Patient now at 1L of oxygen per nasal canula. O2 saturation at 97% 1L per nasal canula. Will continue to monitor.   Farley Ly RN

## 2018-02-07 NOTE — Progress Notes (Signed)
Pharmacy Antibiotic Note  [name] is a 62 y.o. male  with MSSA bacteremia and TV endocarditis and noted with severe AKI, thought AIN 2/2 cefazolin. Pharmacy dosing daptomycin (9/25>>) -CK= 239 on 10/3 (up from 33 on 9/26)  Plan: Continue daptomycin 700 mg (~8mg /kg) IV q48h Will check a CK in am (if trends up will discuss with ID)  Height: 5\' 10"  (177.8 cm) Weight: 203 lb 14.8 oz (92.5 kg) IBW/kg (Calculated) : 73  Temp (24hrs), Avg:98.4 F (36.9 C), Min:97.9 F (36.6 C), Max:98.9 F (37.2 C)  Recent Labs  Lab 02/01/18 0419 02/02/18 0426 02/03/18 0309 02/04/18 0410 02/05/18 0440 02/05/18 1430 02/06/18 0503 02/07/18 0400  WBC 9.3 10.9*  --   --   --  10.2  --   --   CREATININE 6.91* 8.53*  8.50* 9.77* 6.88* 8.06*  --  5.37* 7.01*    Estimated Creatinine Clearance: 12.6 mL/min (A) (by C-G formula based on SCr of 7.01 mg/dL (H)).    Allergies  Allergen Reactions  . Ace Inhibitors Cough   Thank you for allowing pharmacy to be a part of this patient's care.   Hildred Laser, PharmD Clinical Pharmacist Please check Amion for pharmacy contact number

## 2018-02-07 NOTE — Progress Notes (Signed)
[facility] for warfarin  Indication: atrial fibrillation  Allergies  Allergen Reactions  . Ace Inhibitors Cough    Patient Measurements: Height: 5\' 10"  (177.8 cm) Weight: 203 lb 14.8 oz (92.5 kg) IBW/kg (Calculated) : 73   Vital Signs: Temp: 98.6 F (37 C) (10/04 1123) Temp Source: Oral (10/04 1123) BP: 146/75 (10/04 1123) Pulse Rate: 91 (10/04 1123)  Labs: Recent Labs    02/05/18 0440 02/05/18 1430 02/06/18 0503 02/07/18 0400  HGB  --  8.8*  --   --   HCT  --  27.5*  --   --   PLT  --  121*  --   --   LABPROT 18.1*  --  25.9* 25.4*  INR 1.51  --  2.40 2.34  CREATININE 8.06*  --  5.37* 7.01*  CKTOTAL  --   --  239  --     Estimated Creatinine Clearance: 12.6 mL/min (A) (by C-G formula based on SCr of 7.01 mg/dL (H)).   Medical History: Past Medical History:  Diagnosis Date  . Bicuspid aortic valve    a. 02/2009 s/p tissue AVR;  b. 07/2014 Echo: EF 55-60%, basal-mid inf HK, mild AS/MR, mod dil LA, PASP 44 mmHg.  Marland Kitchen CAD (coronary artery disease) October 2010   a. 02/2009 NSTEMI/Cath: 3VD; b. 02/2009 CABG x 5: LIMA->LAD, VG->Diag, VG->RI, VG->RVM->RPDA.  Marland Kitchen GERD (gastroesophageal reflux disease)   . Heart murmur   . Hyperlipidemia   . Hypertension   . Myocardial infarction ([place]) 2010  . PVD (peripheral vascular disease) ([place])    a. s/p toe amputations on L foot.  . Type II diabetes mellitus [facility])     Assessment: 62 yo M with new onset PAF on warfarin (started on 10/2).  -INR is 2.34 (likely result of recent fluconazole)   Goal of Therapy:  INR 2-3 Monitor platelets by anticoagulation protocol: Yes   Plan:  -Coumadin 2.5mg  po today -Daily PT/INR  Hildred Laser, PharmD Clinical Pharmacist Please check Amion for pharmacy contact number

## 2018-02-08 ENCOUNTER — Encounter (HOSPITAL_COMMUNITY): Payer: Self-pay

## 2018-02-08 LAB — RENAL FUNCTION PANEL
ALBUMIN: 1.8 g/dL — AB (ref 3.5–5.0)
ANION GAP: 11 (ref 5–15)
BUN: 40 mg/dL — ABNORMAL HIGH (ref 8–23)
CALCIUM: 7 mg/dL — AB (ref 8.9–10.3)
CO2: 26 mmol/L (ref 22–32)
CREATININE: 5.17 mg/dL — AB (ref 0.61–1.24)
Chloride: 96 mmol/L — ABNORMAL LOW (ref 98–111)
GFR, EST AFRICAN AMERICAN: 13 mL/min — AB (ref >60)
GFR, EST NON AFRICAN AMERICAN: 11 mL/min — AB (ref >60)
Glucose, Bld: 150 mg/dL — ABNORMAL HIGH (ref 70–99)
PHOSPHORUS: 4.9 mg/dL — AB (ref 2.5–4.6)
Potassium: 3.7 mmol/L (ref 3.5–5.1)
SODIUM: 133 mmol/L — AB (ref 135–145)

## 2018-02-08 LAB — CK: Total CK: 105 U/L (ref 49–397)

## 2018-02-08 LAB — PROTIME-INR
INR: 2.76
Prothrombin Time: 29 seconds — ABNORMAL HIGH (ref 11.4–15.2)

## 2018-02-08 LAB — GLUCOSE, CAPILLARY
GLUCOSE-CAPILLARY: 158 mg/dL — AB (ref 70–99)
GLUCOSE-CAPILLARY: 115 mg/dL — AB (ref 70–99)
Glucose-Capillary: 99 mg/dL (ref 70–99)
Glucose-Capillary: 174 mg/dL — ABNORMAL HIGH (ref 70–99)

## 2018-02-08 MED ORDER — WARFARIN SODIUM 1 MG PO TABS
1.0000 mg | ORAL_TABLET | Freq: Once | ORAL | Status: AC
  Administered 2018-02-08: 1 mg via ORAL
  Filled 2018-02-08: qty 1

## 2018-02-08 NOTE — Progress Notes (Signed)
[facility] ROUNDING NOTE   Subjective:   HD yesterday no issues.  Had 356mL UOP yesterday.  He has no complaints except diarrhea.     Objective:  Vital signs in last 24 hours:  Temp:  [98.3 F (36.8 C)-99.5 F (37.5 C)] 98.3 F (36.8 C) (10/05 0744) Pulse Rate:  [77-89] 81 (10/05 0744) Resp:  [16-18] 18 (10/05 0744) BP: (123-137)/(57-73) 137/73 (10/05 0744) SpO2:  [96 %-100 %] 96 % (10/05 0744) Weight:  [94 kg] 94 kg (10/05 0435)  Weight change: -1.9 kg Filed Weights   02/07/18 0655 02/07/18 1038 02/08/18 0435  Weight: 96 kg 92.5 kg 94 kg    Intake/Output: I/O last 3 completed shifts: In: 545.8 [P.O.:420; I.V.:11.8; IV Piggyback:114] Out: 9373 [Urine:320; Other:3000]   Intake/Output this shift:  Total I/O In: 300 [P.O.:300] Out: 80 [SKAJG:81]  CVS- RRR systolic murmur RS-  Normal WOB and clear anteriorly ABD-soft, mod distented abdomen, +BS EXT-generalized edema trace  Access - RIJ trialysis   Basic Metabolic Panel: Recent Labs  Lab 02/04/18 0410 02/05/18 0440 02/06/18 0503 02/07/18 0400 02/08/18 0334  NA 130* 130* 131* 132* 133*  K 4.3 4.3 4.3 4.6 3.7  CL 97* 94* 101 97* 96*  CO2 23 23 26 24 26   GLUCOSE 229* 104* 194* 168* 150*  BUN 43* 62* 39* 57* 40*  CREATININE 6.88* 8.06* 5.37* 7.01* 5.17*  CALCIUM 6.6* 6.9* 6.6* 7.0* 7.0*  PHOS 5.8* 6.1* 5.1* 5.9* 4.9*    Liver Function Tests: Recent Labs  Lab 02/04/18 0410 02/05/18 0440 02/06/18 0503 02/07/18 0400 02/08/18 0334  ALBUMIN 1.9* 1.8* 1.8* 2.0* 1.8*   No results for input(s): LIPASE, AMYLASE in the last 168 hours. No results for input(s): AMMONIA in the last 168 hours.  CBC: Recent Labs  Lab 02/02/18 0426 02/05/18 1430  WBC 10.9* 10.2  HGB 9.0* 8.8*  HCT 28.1* 27.5*  MCV 89.2 90.5  PLT 151 121*    Cardiac Enzymes: Recent Labs  Lab 02/06/18 0503 02/08/18 0334  CKTOTAL 239 105    BNP: Invalid input(s): POCBNP  CBG: Recent Labs  Lab 02/07/18 1119  02/07/18 1719 02/07/18 2157 02/08/18 0743 02/08/18 1133  GLUCAP 106* 215* 229* 51 158*    Microbiology: Results for orders placed or performed during the hospital encounter of 01/27/18  Urine culture     Status: None   Collection Time: 01/28/18  4:29 PM  Result Value Ref Range Status   Specimen Description URINE, CATHETERIZED  Final   Special Requests NONE  Final   Culture   Final    NO GROWTH Performed at [facility], [place] [address] [place], [place] [address]    Report Status 01/29/2018 FINAL  Final    Coagulation Studies: Recent Labs    02/06/18 0503 02/07/18 0400 02/08/18 0334  LABPROT 25.9* 25.4* 29.0*  INR 2.40 2.34 2.76    Urinalysis: No results for input(s): COLORURINE, LABSPEC, PHURINE, GLUCOSEU, HGBUR, BILIRUBINUR, KETONESUR, PROTEINUR, UROBILINOGEN, NITRITE, LEUKOCYTESUR in the last 72 hours.  Invalid input(s): APPERANCEUR    Imaging: No results found.   Medications:   . sodium chloride 250 mL (02/07/18 2018)  . DAPTOmycin (CUBICIN)  IV 700 mg (02/07/18 2020)   . Chlorhexidine Gluconate Cloth  6 each Topical Q0600  . feeding supplement (PRO-STAT SUGAR FREE 64)  30 mL Oral Daily  . Gerhardt's butt cream   Topical TID  . insulin aspart  0-5 Units Subcutaneous QHS  . insulin aspart  0-9 Units Subcutaneous  TID WC  . insulin aspart  4 Units Subcutaneous TID WC  . insulin glargine  22 Units Subcutaneous QHS  . latanoprost  1 drop Both Eyes QHS  . mouth rinse  15 mL Mouth Rinse BID  . metoprolol tartrate  75 mg Oral BID  . nystatin   Topical BID  . pantoprazole  40 mg Oral Q1200  . warfarin  1 mg Oral ONCE-1800  . Warfarin - Pharmacist Dosing Inpatient   Does not apply q1800   sodium chloride, acetaminophen **OR** acetaminophen, clonazepam, ondansetron **OR** ondansetron (ZOFRAN) IV, sodium chloride flush  Assessment/ Plan:     AKI, severe, oliguric:  Cr 01/22/18 1.13 but 9/23 9.6.  He has no clear nephrotoxic events but working diagnosis  is ATN versus AIN from ancef which has been discontinued.  Empiric hydration was not helpful. CT renal stone protocol unrevealing.  He initiated RRT for volume overload.  He remains oliguric.  I think it's very unlikely that this is a glomerulonephritis and a renal biopsy would likely be of limited utility.  If it did show AIN steroids would be contraindicated in the setting of his endocarditis anyway.  Continue to provide supportive care and await renal recovery which is still possible.  No plans for HD this weekend -- following labs and UOP.   He has a temporary catheter which will need to be changed to tunneled if he fails to improve - will need to keep in mind he's anticoagulated now.    Atrial fibrillation with RVR:  Currently in NSR with normal BP.  On metoprolol 75 BID. Coumadin started.  Cardiology following.   Anemia:  Hb has been stable in the 9s but decreased from priors 10-12 last week.  Likely AoCD. 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 Algis Downs input for infectious disease started 01/30/2018 daptomycin 700 mg every 48 hours (end date 10/21 by my calc)  Diabetes mellitus per primary team     LOS: 12 Jannifer Hick A @TODAY @1 :26 PM

## 2018-02-08 NOTE — Progress Notes (Signed)
RN noticed little output from foley- pt stated that his output has always been minimum, even prior to the foley placement.

## 2018-02-08 NOTE — Progress Notes (Signed)
[place] for warfarin  Indication: atrial fibrillation  Allergies  Allergen Reactions  . Ace Inhibitors Cough    Patient Measurements: Height: 5\' 10"  (177.8 cm) Weight: 207 lb 3.7 oz (94 kg) IBW/kg (Calculated) : 73   Vital Signs: Temp: 98.3 F (36.8 C) (10/05 0744) Temp Source: Oral (10/05 0744) BP: 137/73 (10/05 0744) Pulse Rate: 81 (10/05 0744)  Labs: Recent Labs    02/05/18 1430 02/06/18 0503 02/07/18 0400 02/08/18 0334  HGB 8.8*  --   --   --   HCT 27.5*  --   --   --   PLT 121*  --   --   --   LABPROT  --  25.9* 25.4* 29.0*  INR  --  2.40 2.34 2.76  CREATININE  --  5.37* 7.01* 5.17*  CKTOTAL  --  239  --  105    Estimated Creatinine Clearance: 17.3 mL/min (A) (by C-G formula based on SCr of 5.17 mg/dL (H)).   Medical History: Past Medical History:  Diagnosis Date  . Bicuspid aortic valve    a. 02/2009 s/p tissue AVR;  b. 07/2014 Echo: EF 55-60%, basal-mid inf HK, mild AS/MR, mod dil LA, PASP 44 mmHg.  Marland Kitchen CAD (coronary artery disease) October 2010   a. 02/2009 NSTEMI/Cath: 3VD; b. 02/2009 CABG x 5: LIMA->LAD, VG->Diag, VG->RI, VG->RVM->RPDA.  Marland Kitchen GERD (gastroesophageal reflux disease)   . Heart murmur   . Hyperlipidemia   . Hypertension   . Myocardial infarction ([place]) 2010  . PVD (peripheral vascular disease) ([place])    a. s/p toe amputations on L foot.  . Type II diabetes mellitus [facility])     Assessment: 62 yo M with new onset PAF on warfarin (started on 10/2).  -INR is 2.76    Goal of Therapy:  INR 2-3 Monitor platelets by anticoagulation protocol: Yes   Plan:  -Coumadin 1 mg po today -Daily PT/INR  Thank you Anette Guarneri, PharmD [phone]

## 2018-02-08 NOTE — Progress Notes (Signed)
PROGRESS NOTE  [name] JOA:[id number] DOB: 1955-06-01 DOA: 01/27/2018 PCP: [name], MD  Brief Narrative: 62 year old man complicated PMH hospitalized September for sepsis secondary to right foot osteomyelitis, underwent BKA, found to have MSSA bacteremia with splenic infarct and tricuspid valve endocarditis, discharged on Ancef.  Developed acute oliguria and presented to the emergency department where he was found to have acute kidney injury.  Seen by nephrology and infectious disease, antibiotics changed to daptomycin.  Developed acute respiratory failure necessitating transfer to ICU and critical care service.  Temporary dialysis catheter was placed and the patient was started on hemodialysis.  Respiratory status improved and he was transferred back out to the hospital service.  Assessment/Plan AKI, oliguric, no clear nephrotoxic events, working diagnosis ATN versus AIN from Ancef.  CT renal stone protocol unrevealing.   --Some urine output noted.  Continue management per nephrology.    PAF with rapid ventricular response, new diagnosis --Remains stable.  Continue metoprolol, warfarin per pharmacy.  INR therapeutic.   --Follow-up with Dr. Percival Spanish 3-4 weeks after hospitalization.  Acute hypoxic respiratory failure secondary to volume overload, pulmonary edema --Down to 1 L nasal cannula, continue to wean as tolerated.  Diabetes mellitus type 2, peripheral vascular disease status post bilateral BKA --CBG stable.  Continue Lantus, NovoLog meal coverage, sliding scale insulin. -- Follow-up with Dr. Sharol Given in 1 week for staple removal.  MSSA bacteremia, tricuspid valve endocarditis, splenic infarct diagnosed 9/17 --Continue daptomycin per infectious disease  Oral candidiasis --Treated with fluconazole  Thrombocytopenia, stable.  Seen September 2019 as well.  Follow.  Anemia of critical illness, stable.  Obesity --per dietician  Loss of cortex along the anterior  inferior aspect of the T12 vertebral body as well as lucency in the posterior aspect of the L1 vertebral body. Question a degree of discitis/osteomyelitis in this region. This finding may warrant MR pre and post-contrast to further evaluate with respect to discitis/bony destruction. --Discussed with Dr. [name].  No change to therapy recommended.  Continue daptomycin.  Aortic atherosclerosis --Follow-up as an outpatient   Continue present management per nephrology.  Hopeful for renal recovery.  Monitor diarrhea for now.  Has not had any laxatives in the last few days.  DVT prophylaxis: warfarin Code Status: Full Family Communication: none Disposition Plan: pending    [name], MD  [facility] Direct contact: [phone] --Via amion app OR  --[email or web address]; password [email or web address]  7PM-7AM contact night coverage as above 02/08/2018, 1:17 PM  LOS: 12 days   Consultants:  PCCM  Nephrology  ID  Cardiology   Procedures:  Temporary HD catheter  HD  Antimicrobials:  Daptomycin   Interval history/Subjective: Feels ok, except having diarrhea today. No pain. Ate breakfast.  Objective: Vitals:  Vitals:   02/08/18 0420 02/08/18 0744  BP: (!) 123/57 137/73  Pulse: 77 81  Resp: 16 18  Temp: 98.4 F (36.9 C) 98.3 F (36.8 C)  SpO2: 100% 96%    Exam: Constitutional:   . Appears calm, mildly uncomfortable Respiratory:  . CTA bilaterally, no w/r/r.  . Respiratory effort normal.  Cardiovascular:  . RRR, no m/r/g Abdomen:  . Soft, mild generalized tenderness Psychiatric:  . Mental status o Mood, affect appropriate  I have personally reviewed the following:   Data: . I/O o UOP: 320 o BM: Diarrhea 10/5  CBG: Stable.  No lows.  Labs: BMP without significant change.  INR 2.76.  Imaging: none   Other: none  Scheduled Meds: . Chlorhexidine Gluconate Cloth  6 each  Topical Q0600  . feeding supplement (PRO-STAT SUGAR FREE 64)  30 mL Oral Daily    . Gerhardt's butt cream   Topical TID  . insulin aspart  0-5 Units Subcutaneous QHS  . insulin aspart  0-9 Units Subcutaneous TID WC  . insulin aspart  4 Units Subcutaneous TID WC  . insulin glargine  22 Units Subcutaneous QHS  . latanoprost  1 drop Both Eyes QHS  . mouth rinse  15 mL Mouth Rinse BID  . metoprolol tartrate  75 mg Oral BID  . nystatin   Topical BID  . pantoprazole  40 mg Oral Q1200  . polyethylene glycol  17 g Oral Daily  . senna-docusate  1 tablet Oral BID  . warfarin  1 mg Oral ONCE-1800  . Warfarin - Pharmacist Dosing Inpatient   Does not apply q1800   Continuous Infusions: . sodium chloride 250 mL (02/07/18 2018)  . DAPTOmycin (CUBICIN)  IV 700 mg (02/07/18 2020)    Principal Problem:   Acute renal failure (ARF) (HCC) Active Problems:   Insulin-requiring or dependent type II diabetes mellitus ([place])   Essential hypertension   CAD (coronary artery disease)   Leukocytosis   Thrombocytopenia (HCC)   S/P BKA (below knee amputation) unilateral, right (HCC)   Endocarditis of tricuspid valve   Hyponatremia   PAF (paroxysmal atrial fibrillation) ([place])   Aortic atherosclerosis ([place])   Acute hypoxemic respiratory failure ([place])   LOS: 12 days

## 2018-02-09 LAB — RENAL FUNCTION PANEL
ALBUMIN: 1.9 g/dL — AB (ref 3.5–5.0)
ANION GAP: 11 (ref 5–15)
BUN: 54 mg/dL — ABNORMAL HIGH (ref 8–23)
CO2: 24 mmol/L (ref 22–32)
Calcium: 7 mg/dL — ABNORMAL LOW (ref 8.9–10.3)
Chloride: 97 mmol/L — ABNORMAL LOW (ref 98–111)
Creatinine, Ser: 6.47 mg/dL — ABNORMAL HIGH (ref 0.61–1.24)
GFR calc Af Amer: 10 mL/min — ABNORMAL LOW (ref >60)
GFR calc non Af Amer: 8 mL/min — ABNORMAL LOW (ref >60)
Glucose, Bld: 117 mg/dL — ABNORMAL HIGH (ref 70–99)
PHOSPHORUS: 5.9 mg/dL — AB (ref 2.5–4.6)
POTASSIUM: 3.9 mmol/L (ref 3.5–5.1)
Sodium: 132 mmol/L — ABNORMAL LOW (ref 135–145)

## 2018-02-09 LAB — CBC
HEMATOCRIT: 27.1 % — AB (ref 39.0–52.0)
HEMOGLOBIN: 8.5 g/dL — AB (ref 13.0–17.0)
MCH: 28.6 pg (ref 26.0–34.0)
MCHC: 31.4 g/dL (ref 30.0–36.0)
MCV: 91.2 fL (ref 78.0–100.0)
PLATELETS: 142 10*3/uL — AB (ref 150–400)
RBC: 2.97 MIL/uL — AB (ref 4.22–5.81)
RDW: 14 % (ref 11.5–15.5)
WBC: 11.8 10*3/uL — AB (ref 4.0–10.5)

## 2018-02-09 LAB — GLUCOSE, CAPILLARY
Glucose-Capillary: 98 mg/dL (ref 70–99)
Glucose-Capillary: 181 mg/dL — ABNORMAL HIGH (ref 70–99)
Glucose-Capillary: 164 mg/dL — ABNORMAL HIGH (ref 70–99)
Glucose-Capillary: 203 mg/dL — ABNORMAL HIGH (ref 70–99)

## 2018-02-09 LAB — PROTIME-INR
INR: 2.68
Prothrombin Time: 28.3 seconds — ABNORMAL HIGH (ref 11.4–15.2)

## 2018-02-09 MED ORDER — WARFARIN SODIUM 2.5 MG PO TABS
2.5000 mg | ORAL_TABLET | Freq: Once | ORAL | Status: AC
  Administered 2018-02-09: 2.5 mg via ORAL
  Filled 2018-02-09: qty 1

## 2018-02-09 NOTE — Progress Notes (Signed)
PROGRESS NOTE  [name] WJX:[id number] DOB: 1955-10-24 DOA: 01/27/2018 PCP: [name], MD  Brief Narrative: 62 year old man complicated PMH hospitalized September for sepsis secondary to right foot osteomyelitis, underwent BKA, found to have MSSA bacteremia with splenic infarct and tricuspid valve endocarditis, discharged on Ancef.  Developed acute oliguria and presented to the emergency department where he was found to have acute kidney injury.  Seen by nephrology and infectious disease, antibiotics changed to daptomycin.  Developed acute respiratory failure necessitating transfer to ICU and critical care service.  Temporary dialysis catheter was placed and the patient was started on hemodialysis.  Respiratory status improved and he was transferred back out to the hospital service.  Assessment/Plan AKI, oliguric, no clear nephrotoxic events, working diagnosis ATN versus AIN from Ancef.  CT renal stone protocol unrevealing.   --Plan for hemodialysis tomorrow.  Continue management per nephrology.  PAF with rapid ventricular response, new diagnosis --Stable.  Continue metoprolol, warfarin.    --Follow-up with Dr. Percival Spanish 3-4 weeks after hospitalization.  Acute hypoxic respiratory failure secondary to volume overload, pulmonary edema --Resolving.  Continue to wean as tolerated.  Diabetes mellitus type 2, peripheral vascular disease status post bilateral BKA --CBG remains stable.  Continue Lantus, NovoLog meal coverage, sliding scale insulin. -- Follow-up with Dr. Sharol Given in 1 week for staple removal.  MSSA bacteremia, tricuspid valve endocarditis, splenic infarct diagnosed 9/17 --Continue daptomycin per infectious disease  Oral candidiasis --Treated with fluconazole  Thrombocytopenia, stable.  Seen September 2019 as well.  Improving.  Anemia of critical illness, stable.  Obesity --Continue management per dietician  Loss of cortex along the anterior inferior aspect of  the T12 vertebral body as well as lucency in the posterior aspect of the L1 vertebral body. Question a degree of discitis/osteomyelitis in this region. This finding may warrant MR pre and post-contrast to further evaluate with respect to discitis/bony destruction. --Discussed with Dr. [name].  No change to therapy recommended.  Continue daptomycin.  Aortic atherosclerosis --Follow-up as an outpatient   Diarrhea has resolved.  Continue management per nephrology.  DVT prophylaxis: warfarin Code Status: Full Family Communication: none Disposition Plan: pending    [name], MD  [facility] Direct contact: [phone] --Via amion app OR  --[email or web address]; password [email or web address]  7PM-7AM contact night coverage as above 02/09/2018, 1:03 PM  LOS: 13 days   Consultants:  PCCM  Nephrology  ID  Cardiology   Procedures:  Temporary HD catheter  HD  Antimicrobials:  Daptomycin   Interval history/Subjective: Feels better today.  Diarrhea has resolved  Objective: Vitals:  Vitals:   02/09/18 0546 02/09/18 0806  BP: 138/83 (!) 148/72  Pulse: 80 84  Resp: 20 18  Temp: 97.9 F (36.6 C) 98.2 F (36.8 C)  SpO2: 100% 99%    Exam: Constitutional:   . Appears calm and comfortable; better today Respiratory:  . CTA bilaterally, no w/r/r.  . Respiratory effort normal.  Cardiovascular:  . RRR, no m/r/g Psychiatric:  . Mental status o Mood, affect appropriate  I have personally reviewed the following:   Data: . I/O o UOP: 325 o BM: Diarrhea 10/5  CBG: Stable.  No lows.  Labs: Potassium within normal limits.  Creatinine up 6.47.  Hemoglobin stable 8.5.  Minimal leukocytosis 11.8.  Platelets improving, 142.  INR 2.68.  Imaging: none   Other: none  Scheduled Meds: . Chlorhexidine Gluconate Cloth  6 each Topical Q0600  . feeding supplement (PRO-STAT SUGAR FREE 64)  30 mL Oral Daily  . Gerhardt's  butt cream   Topical TID  . insulin aspart  0-5 Units  Subcutaneous QHS  . insulin aspart  0-9 Units Subcutaneous TID WC  . insulin aspart  4 Units Subcutaneous TID WC  . insulin glargine  22 Units Subcutaneous QHS  . latanoprost  1 drop Both Eyes QHS  . mouth rinse  15 mL Mouth Rinse BID  . metoprolol tartrate  75 mg Oral BID  . nystatin   Topical BID  . pantoprazole  40 mg Oral Q1200  . warfarin  2.5 mg Oral ONCE-1800  . Warfarin - Pharmacist Dosing Inpatient   Does not apply q1800   Continuous Infusions: . sodium chloride 250 mL (02/07/18 2018)  . DAPTOmycin (CUBICIN)  IV 700 mg (02/07/18 2020)    Principal Problem:   Acute renal failure (ARF) (HCC) Active Problems:   Insulin-requiring or dependent type II diabetes mellitus ([place])   Essential hypertension   CAD (coronary artery disease)   Leukocytosis   Thrombocytopenia (HCC)   S/P BKA (below knee amputation) unilateral, right (HCC)   Endocarditis of tricuspid valve   Hyponatremia   PAF (paroxysmal atrial fibrillation) ([place])   Aortic atherosclerosis ([place])   Acute hypoxemic respiratory failure ([place])   LOS: 13 days

## 2018-02-09 NOTE — Progress Notes (Signed)
[facility] ROUNDING NOTE   Subjective:   Diarrhea improved.  No complaints.   Objective:  Vital signs in last 24 hours:  Temp:  [97.9 F (36.6 C)-98.4 F (36.9 C)] 98.2 F (36.8 C) (10/06 0806) Pulse Rate:  [80-85] 84 (10/06 0806) Resp:  [18-22] 18 (10/06 0806) BP: (135-148)/(69-83) 148/72 (10/06 0806) SpO2:  [98 %-100 %] 99 % (10/06 0806)  Weight change:  Filed Weights   02/07/18 0655 02/07/18 1038 02/08/18 0435  Weight: 96 kg 92.5 kg 94 kg    Intake/Output: I/O last 3 completed shifts: In: 941.8 [P.O.:816; I.V.:11.8; IV Piggyback:114] Out: 345 [Urine:345]   Intake/Output this shift:  No intake/output data recorded.  CVS- RRR systolic murmur RS-  Normal WOB and clear anteriorly ABD-soft, mod distented abdomen, +BS EXT-generalized edema trace  Access - RIJ trialysis   Basic Metabolic Panel: Recent Labs  Lab 02/05/18 0440 02/06/18 0503 02/07/18 0400 02/08/18 0334 02/09/18 0340  NA 130* 131* 132* 133* 132*  K 4.3 4.3 4.6 3.7 3.9  CL 94* 101 97* 96* 97*  CO2 23 26 24 26 24   GLUCOSE 104* 194* 168* 150* 117*  BUN 62* 39* 57* 40* 54*  CREATININE 8.06* 5.37* 7.01* 5.17* 6.47*  CALCIUM 6.9* 6.6* 7.0* 7.0* 7.0*  PHOS 6.1* 5.1* 5.9* 4.9* 5.9*    Liver Function Tests: Recent Labs  Lab 02/05/18 0440 02/06/18 0503 02/07/18 0400 02/08/18 0334 02/09/18 0340  ALBUMIN 1.8* 1.8* 2.0* 1.8* 1.9*   No results for input(s): LIPASE, AMYLASE in the last 168 hours. No results for input(s): AMMONIA in the last 168 hours.  CBC: Recent Labs  Lab 02/05/18 1430 02/09/18 0340  WBC 10.2 11.8*  HGB 8.8* 8.5*  HCT 27.5* 27.1*  MCV 90.5 91.2  PLT 121* 142*    Cardiac Enzymes: Recent Labs  Lab 02/06/18 0503 02/08/18 0334  CKTOTAL 239 105    BNP: Invalid input(s): POCBNP  CBG: Recent Labs  Lab 02/08/18 0743 02/08/18 1133 02/08/18 1646 02/08/18 2123 02/09/18 0807  GLUCAP 99 158* 115* 174* 74    Microbiology: Results for orders  placed or performed during the hospital encounter of 01/27/18  Urine culture     Status: None   Collection Time: 01/28/18  4:29 PM  Result Value Ref Range Status   Specimen Description URINE, CATHETERIZED  Final   Special Requests NONE  Final   Culture   Final    NO GROWTH Performed at [facility], [place] [address] [place], [place] [address]    Report Status 01/29/2018 FINAL  Final    Coagulation Studies: Recent Labs    02/07/18 0400 02/08/18 0334 02/09/18 0340  LABPROT 25.4* 29.0* 28.3*  INR 2.34 2.76 2.68    Urinalysis: No results for input(s): COLORURINE, LABSPEC, PHURINE, GLUCOSEU, HGBUR, BILIRUBINUR, KETONESUR, PROTEINUR, UROBILINOGEN, NITRITE, LEUKOCYTESUR in the last 72 hours.  Invalid input(s): APPERANCEUR    Imaging: No results found.   Medications:   . sodium chloride 250 mL (02/07/18 2018)  . DAPTOmycin (CUBICIN)  IV 700 mg (02/07/18 2020)   . Chlorhexidine Gluconate Cloth  6 each Topical Q0600  . feeding supplement (PRO-STAT SUGAR FREE 64)  30 mL Oral Daily  . Gerhardt's butt cream   Topical TID  . insulin aspart  0-5 Units Subcutaneous QHS  . insulin aspart  0-9 Units Subcutaneous TID WC  . insulin aspart  4 Units Subcutaneous TID WC  . insulin glargine  22 Units Subcutaneous QHS  . latanoprost  1 drop  Both Eyes QHS  . mouth rinse  15 mL Mouth Rinse BID  . metoprolol tartrate  75 mg Oral BID  . nystatin   Topical BID  . pantoprazole  40 mg Oral Q1200  . warfarin  2.5 mg Oral ONCE-1800  . Warfarin - Pharmacist Dosing Inpatient   Does not apply q1800   sodium chloride, acetaminophen **OR** acetaminophen, clonazepam, ondansetron **OR** ondansetron (ZOFRAN) IV, sodium chloride flush  Assessment/ Plan:    1.  AKI, severe, oliguric:  Cr 01/22/18 1.13 but 9/23 9.6.  He has no clear nephrotoxic events (did have low PO intake days prior to presentation) but working diagnosis is ATN versus AIN from ancef which has been discontinued.  Empiric hydration  was not helpful. CT renal stone protocol unrevealing.  He initiated RRT for volume overload.  I think it's very unlikely that this is a glomerulonephritis and a renal biopsy would likely be of limited utility.  If it did show AIN steroids would be contraindicated in the setting of his endocarditis anyway.  Continue to provide supportive care and await renal recovery which is still possible.    Creatinine rise 1.3 over 24h, borderline oliguric so plan for HD tomorrow.  He has a temporary catheter which will need to be changed to tunneled if he fails to improve - will need to keep in mind he's anticoagulated now.    2. Atrial fibrillation with RVR:  Currently in NSR with normal BP.  On metoprolol 75 BID. Coumadin started.  Cardiology following.   3.  Anemia:  Hb has been stable in the 9s but  8.5 today.  Likely AoCD. Transfuse PRN  4. 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 Algis Downs input for infectious disease started 01/30/2018 daptomycin 700 mg every 48 hours (end date 10/21 by my calc)  5.  Diabetes mellitus per primary team     LOS: 13 Jannifer Hick A @TODAY @11 :53 AM

## 2018-02-09 NOTE — Progress Notes (Signed)
[place] for warfarin  Indication: atrial fibrillation  Allergies  Allergen Reactions  . Ace Inhibitors Cough    Patient Measurements: Height: 5\' 10"  (177.8 cm) Weight: 207 lb 3.7 oz (94 kg) IBW/kg (Calculated) : 73   Vital Signs: Temp: 98.2 F (36.8 C) (10/06 0806) Temp Source: Oral (10/06 0806) BP: 148/72 (10/06 0806) Pulse Rate: 84 (10/06 0806)  Labs: Recent Labs    02/07/18 0400 02/08/18 0334 02/09/18 0340  HGB  --   --  8.5*  HCT  --   --  27.1*  PLT  --   --  142*  LABPROT 25.4* 29.0* 28.3*  INR 2.34 2.76 2.68  CREATININE 7.01* 5.17* 6.47*  CKTOTAL  --  105  --     Estimated Creatinine Clearance: 13.8 mL/min (A) (by C-G formula based on SCr of 6.47 mg/dL (H)).   Medical History: Past Medical History:  Diagnosis Date  . Bicuspid aortic valve    a. 02/2009 s/p tissue AVR;  b. 07/2014 Echo: EF 55-60%, basal-mid inf HK, mild AS/MR, mod dil LA, PASP 44 mmHg.  Marland Kitchen CAD (coronary artery disease) October 2010   a. 02/2009 NSTEMI/Cath: 3VD; b. 02/2009 CABG x 5: LIMA->LAD, VG->Diag, VG->RI, VG->RVM->RPDA.  Marland Kitchen GERD (gastroesophageal reflux disease)   . Heart murmur   . Hyperlipidemia   . Hypertension   . Myocardial infarction ([place]) 2010  . PVD (peripheral vascular disease) ([place])    a. s/p toe amputations on L foot.  . Type II diabetes mellitus [facility])     Assessment: 62 yo M with new onset PAF on warfarin (started on 10/2).  -INR today = 2.68  Goal of Therapy:  INR 2-3 Monitor platelets by anticoagulation protocol: Yes   Plan:  -Coumadin 2.5 mg po today -Daily PT/INR  Thank you Anette Guarneri, PharmD [phone]

## 2018-02-10 ENCOUNTER — Inpatient Hospital Stay (HOSPITAL_COMMUNITY): Payer: Medicare Other

## 2018-02-10 ENCOUNTER — Ambulatory Visit: Payer: BLUE CROSS/BLUE SHIELD | Admitting: Cardiology

## 2018-02-10 LAB — CBC
HEMATOCRIT: 27.8 % — AB (ref 39.0–52.0)
Hemoglobin: 8.7 g/dL — ABNORMAL LOW (ref 13.0–17.0)
MCH: 28 pg (ref 26.0–34.0)
MCHC: 31.3 g/dL (ref 30.0–36.0)
MCV: 89.4 fL (ref 78.0–100.0)
Platelets: 140 10*3/uL — ABNORMAL LOW (ref 150–400)
RBC: 3.11 MIL/uL — ABNORMAL LOW (ref 4.22–5.81)
RDW: 13.5 % (ref 11.5–15.5)
WBC: 13 10*3/uL — ABNORMAL HIGH (ref 4.0–10.5)

## 2018-02-10 LAB — RENAL FUNCTION PANEL
Albumin: 2.1 g/dL — ABNORMAL LOW (ref 3.5–5.0)
Anion gap: 12 (ref 5–15)
BUN: 74 mg/dL — ABNORMAL HIGH (ref 8–23)
CALCIUM: 6.5 mg/dL — AB (ref 8.9–10.3)
CO2: 22 mmol/L (ref 22–32)
CREATININE: 7.33 mg/dL — AB (ref 0.61–1.24)
Chloride: 95 mmol/L — ABNORMAL LOW (ref 98–111)
GFR calc Af Amer: 8 mL/min — ABNORMAL LOW (ref >60)
GFR calc non Af Amer: 7 mL/min — ABNORMAL LOW (ref >60)
Glucose, Bld: 68 mg/dL — ABNORMAL LOW (ref 70–99)
Phosphorus: 6.3 mg/dL — ABNORMAL HIGH (ref 2.5–4.6)
Potassium: 3.7 mmol/L (ref 3.5–5.1)
SODIUM: 129 mmol/L — AB (ref 135–145)

## 2018-02-10 LAB — GLUCOSE, CAPILLARY
GLUCOSE-CAPILLARY: 113 mg/dL — AB (ref 70–99)
Glucose-Capillary: 76 mg/dL (ref 70–99)
Glucose-Capillary: 141 mg/dL — ABNORMAL HIGH (ref 70–99)

## 2018-02-10 LAB — PROTIME-INR
INR: 2.72
Prothrombin Time: 28.6 seconds — ABNORMAL HIGH (ref 11.4–15.2)

## 2018-02-10 MED ORDER — WARFARIN SODIUM 2.5 MG PO TABS: 2.5000 mg | ORAL_TABLET | Freq: Once | ORAL | Status: DC

## 2018-02-10 MED ORDER — HEPARIN SODIUM (PORCINE) 1000 UNIT/ML IJ SOLN
INTRAMUSCULAR | Status: AC
  Administered 2018-02-10: 1200 [IU] via INTRAVENOUS_CENTRAL
  Filled 2018-02-10: qty 3

## 2018-02-10 MED ORDER — PHYTONADIONE 1 MG/0.5 ML ORAL SOLUTION
1.0000 mg | Freq: Once | ORAL | Status: AC
  Administered 2018-02-10: 1 mg via ORAL
  Filled 2018-02-10: qty 0.5

## 2018-02-10 MED ORDER — HEPARIN SODIUM (PORCINE) 1000 UNIT/ML DIALYSIS
1000.0000 [IU] | INTRAMUSCULAR | Status: DC | PRN
  Administered 2018-02-10: 1200 [IU] via INTRAVENOUS_CENTRAL

## 2018-02-10 MED ORDER — ALTEPLASE 2 MG IJ SOLR: 2.0000 mg | Freq: Once | INTRAMUSCULAR | Status: DC | PRN

## 2018-02-10 MED ORDER — FUROSEMIDE 10 MG/ML IJ SOLN
40.0000 mg | Freq: Once | INTRAMUSCULAR | Status: AC
  Administered 2018-02-10: 40 mg via INTRAVENOUS
  Filled 2018-02-10: qty 4

## 2018-02-10 MED ORDER — SODIUM CHLORIDE 0.9 % IV SOLN: 100.0000 mL | INTRAVENOUS | Status: DC | PRN

## 2018-02-10 NOTE — Plan of Care (Signed)
  Problem: Health Behavior/Discharge Planning: Goal: Ability to manage health-related needs will improve Outcome: Progressing   Problem: Education: Goal: Knowledge of disease and its progression will improve Outcome: Progressing   Problem: Health Behavior/Discharge Planning: Goal: Ability to manage health-related needs will improve Outcome: Progressing   Problem: Clinical Measurements: Goal: Complications related to the disease process or treatment will be avoided or minimized Outcome: Progressing Goal: Dialysis access will remain free of complications Outcome: Progressing   Problem: Activity: Goal: Activity intolerance will improve Outcome: Progressing   Problem: Nutritional: Goal: Ability to make appropriate dietary choices will improve Outcome: Progressing   Problem: Self-Concept: Goal: Body image disturbance will be avoided or minimized Outcome: Progressing   Problem: Urinary Elimination: Goal: Progression of disease will be identified and treated Outcome: Progressing

## 2018-02-10 NOTE — Procedures (Signed)
I was present at this dialysis session. I have reviewed the session itself and made appropriate changes.   UOP 0.1L, K 3.7.  UF goal 2.5L, 4K bath.  Has some c/o rough night. Inc UF to 3L goal given good BPs.   Will consult IR for [facility] today.  Filed Weights   02/07/18 0655 02/07/18 1038 02/08/18 0435  Weight: 96 kg 92.5 kg 94 kg    Recent Labs  Lab 02/10/18 0713  NA 129*  K 3.7  CL 95*  CO2 22  GLUCOSE 68*  BUN 74*  CREATININE 7.33*  CALCIUM 6.5*  PHOS 6.3*    Recent Labs  Lab 02/05/18 1430 02/09/18 0340 02/10/18 0714  WBC 10.2 11.8* 13.0*  HGB 8.8* 8.5* 8.7*  HCT 27.5* 27.1* 27.8*  MCV 90.5 91.2 89.4  PLT 121* 142* 140*    Scheduled Meds: . Chlorhexidine Gluconate Cloth  6 each Topical Q0600  . feeding supplement (PRO-STAT SUGAR FREE 64)  30 mL Oral Daily  . Gerhardt's butt cream   Topical TID  . insulin aspart  0-5 Units Subcutaneous QHS  . insulin aspart  0-9 Units Subcutaneous TID WC  . insulin aspart  4 Units Subcutaneous TID WC  . insulin glargine  22 Units Subcutaneous QHS  . latanoprost  1 drop Both Eyes QHS  . mouth rinse  15 mL Mouth Rinse BID  . metoprolol tartrate  75 mg Oral BID  . nystatin   Topical BID  . pantoprazole  40 mg Oral Q1200  . Warfarin - Pharmacist Dosing Inpatient   Does not apply q1800   Continuous Infusions: . sodium chloride 250 mL (02/07/18 2018)  . sodium chloride    . sodium chloride    . DAPTOmycin (CUBICIN)  IV 700 mg (02/09/18 2021)   PRN Meds:.sodium chloride, sodium chloride, sodium chloride, acetaminophen **OR** acetaminophen, alteplase, clonazepam, heparin, ondansetron **OR** ondansetron (ZOFRAN) IV, sodium chloride flush   Pearson Grippe  MD 02/10/2018, 9:01 AM

## 2018-02-10 NOTE — Clinical Social Work Note (Signed)
CSWcontinuing to follow patient's progress. [facility] and[facility] Other insuranceauthorization received. CSW willprovide SW intervention services as needed and facilitate discharge back to SNF when medically stable.  Briani Maul Givens, MSW, LCSW Licensed Clinical Social Worker [place] [phone]

## 2018-02-10 NOTE — Consult Note (Signed)
Chief Complaint: Patient was seen in consultation today for tunneled HD catheter placement in IR  Referring Physician(s): Dr. Pearson Grippe  Supervising Physician: Marybelle Killings  Patient Status: [facility] - In-pt  History of Present Illness: [name] is a 62 y.o. male with a past medical history significant for HTN, HLD, CAD, DM II, PVD with history of amputation, history of MI, bicuspid aortic valve with replacement who presented to [facility] ED on 9/23 due to lab work that was drawn at [facility] which were concerning for acute renal failure and patient c/o decreased urine output for several days. 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 During that hospitalization he was also found to splenic infarct and TV endocarditis for which he was started on IV Ancef via PICC and was d/ced to SNF.   Initial labs in ED showed creatinine 9.60 (previously 1.13 two weeks prior), BUN 84, sodium 128. He was also noted to have WBC of 18.6 which had increased from previous; all other lab work essentially unremarkable, patient remained hemodynamically stabel and was subsequently admitted for further evaluation and management.  Unfortunately, he did not respond to IVF/diuretics and developed respiratory distress requiring BiPAP. He ultimately had a temporary hemodialysis catheter placed by critical care on 9/25 and underwent initial HD on 9/26. Request has been made to IR for placement of tunneled hemodialysis catheter.  Patient reports he is feeling better after HD this morning, he is hungry and would like some lunch.  Past Medical History:  Diagnosis Date  . Bicuspid aortic valve    a. 02/2009 s/p tissue AVR;  b. 07/2014 Echo: EF 55-60%, basal-mid inf HK, mild AS/MR, mod dil LA, PASP 44 mmHg.  Marland Kitchen CAD (coronary artery disease) October 2010   a. 02/2009 NSTEMI/Cath: 3VD; b. 02/2009 CABG x 5: LIMA->LAD, VG->Diag, VG->RI, VG->RVM->RPDA.  Marland Kitchen GERD (gastroesophageal  reflux disease)   . Heart murmur   . Hyperlipidemia   . Hypertension   . Myocardial infarction ([place]) 2010  . PVD (peripheral vascular disease) ([place])    a. s/p toe amputations on L foot.  . Type II diabetes mellitus ([place])     Past Surgical History:  Procedure Laterality Date  . AMPUTATION Left 2011   great and second toes  . AMPUTATION  07/27/2011   Procedure: AMPUTATION FOOT;  Surgeon: Newt Minion, MD;  Location: [place];  Service: Orthopedics;  Laterality: Left;  Left Midfoot Amputation   . AMPUTATION Left 12/08/2014   Procedure: Left Below Knee Amputation;  Surgeon: Newt Minion, MD;  Location: [place];  Service: Orthopedics;  Laterality: Left;  . AMPUTATION Right 01/17/2018   Procedure: RIGHT BELOW KNEE AMPUTATION;  Surgeon: Newt Minion, MD;  Location: [place];  Service: Orthopedics;  Laterality: Right;  . AMPUTATION TOE Right    "all my toes on my right foot have been amputated"  . AORTIC VALVE REPLACEMENT  2010   Pericardial tissue valve  . CARDIAC CATHETERIZATION  2010  . CARDIAC VALVE REPLACEMENT    . CATARACT EXTRACTION W/ INTRAOCULAR LENS  IMPLANT, BILATERAL Bilateral   . CORONARY ARTERY BYPASS GRAFT  2010   "CABG X5"  . TEE WITHOUT CARDIOVERSION N/A 01/21/2018   Procedure: TRANSESOPHAGEAL ECHOCARDIOGRAM (TEE);  Surgeon: Buford Dresser, MD;  Location: [facility] ENDOSCOPY;  Service: Cardiovascular;  Laterality: N/A;    Allergies: Ace inhibitors  Medications: Prior to Admission medications   Medication Sig Start Date End Date Taking? Authorizing Provider  aspirin 325 MG tablet Take 325 mg by mouth daily.     Yes [provider]  bisacodyl (DULCOLAX) 10 MG suppository Place 10 mg rectally once as needed (for constipation not relieved by Milk of Magnesia).    Yes [provider]  Calcium Citrate-Vitamin D (CALCIUM CITRATE + D3) 250-200 MG-UNIT TABS Take 1 tablet by mouth 2 (two) times daily.   Yes [provider]  ceFAZolin (ANCEF) IVPB Inject 2 g  into the vein every 8 (eight) hours. Indication:  MSSA bacteremia/endocarditis Last Day of Therapy:  03/08/18 Labs - Once weekly:  CBC/D and BMP, Labs - Every other week:  ESR and CRP 01/22/18 03/09/18 Yes Florencia Reasons, MD  cetirizine (ZYRTEC) 10 MG tablet Take 10 mg by mouth daily.   Yes [provider]  Dulaglutide (TRULICITY) 1.5 DZ/3.2DJ SOPN Inject 1.5 mg into the skin every Monday.    Yes [provider]  glipiZIDE (GLUCOTROL XL) 10 MG 24 hr tablet Take 10 mg by mouth daily.  04/02/16  Yes [provider]  insulin aspart (NOVOLOG) 100 UNIT/ML injection Before each meal 3 times a day, 140-199 - 2 units, 200-250 - 4 units, 251-299 - 6 units,  300-349 - 8 units,  350 or above 10 units. Insulin PEN if approved, provide syringes and needles if needed. Patient taking differently: Inject 2-10 Units into the skin See admin instructions. Inject 2-10 units into the skin three times a day before meals, per sliding scale: BGL 140-199 = 2 units; 200-250 = 4 units; 251-299 = 6 units; 300-349 = 8 units; 350 or greater = 10 units 01/22/18  Yes Florencia Reasons, MD  ketoconazole (NIZORAL) 2 % cream Apply 1 application topically See admin instructions. Apply to both buttocks and "GL fold" rash two times a day   Yes [provider]  latanoprost (XALATAN) 0.005 % ophthalmic solution Place 1 drop into both eyes at bedtime. 07/13/14  Yes [provider]  losartan (COZAAR) 50 MG tablet Take 50 mg by mouth daily.  07/19/14  Yes [provider]  magnesium hydroxide (MILK OF MAGNESIA) 400 MG/5ML suspension Take 30 mLs by mouth once as needed for mild constipation.   Yes [provider]  metoprolol (LOPRESSOR) 50 MG tablet TAKE 2 TABLETS BY MOUTH EVERY MORNING AND 1 EVERY EVENING Patient taking differently: Take 50-100 mg by mouth See admin instructions. Take 100 mg by mouth in the morning and 50 mg in the evening 12/30/15  Yes Fenton Malling M, PA-C  Multiple Vitamin  (MULTIVITAMIN) tablet Take 1 tablet by mouth daily.     Yes [provider]  naproxen sodium (ANAPROX) 220 MG tablet Take 220 mg by mouth 2 (two) times daily as needed (pain).   Yes [provider]  oxyCODONE (OXY IR/ROXICODONE) 5 MG immediate release tablet Take 1 tablet (5 mg total) by mouth every 4 (four) hours as needed for breakthrough pain (pain score 4-6). Patient taking differently: Take 5 mg by mouth every 4 (four) hours as needed for breakthrough pain (for pain).  01/22/18  Yes Florencia Reasons, MD  pantoprazole (PROTONIX) 40 MG tablet Take 40 mg by mouth daily.     Yes [provider]  polyethylene glycol (MIRALAX / GLYCOLAX) packet Take 17 g by mouth daily as needed for mild constipation. 01/22/18  Yes Florencia Reasons, MD  simvastatin (ZOCOR) 20 MG tablet Take 20 mg by mouth daily at 6 PM.  08/17/14  Yes [provider]  Sodium Phosphates (RA  SALINE ENEMA) 19-7 GM/118ML ENEM Place 1 enema rectally once as needed (for constipation not relieved by Dulcolax suppository and notify MD if no relief from enema).   Yes [provider]  TOUJEO SOLOSTAR 300 UNIT/ML SOPN Inject 30 Units into the skin See admin instructions. Use up to 100 units daily Patient taking differently: Inject 30 Units into the skin daily.  01/22/18  Yes Florencia Reasons, MD  furosemide (LASIX) 40 MG tablet Take 40 mg by mouth 2 (two) times daily.    07/23/11  [provider]     Family History  Problem Relation Age of Onset  . Heart attack Father        early age  . Heart disease Father   . Stroke Father   . Heart attack Brother        At age 58  . Stroke Brother   . Diabetes type II Mother   . Diabetes type II Sister   . Diabetes Unknown   . Coronary artery disease Unknown     Social History   Socioeconomic History  . Marital status: Single    Spouse name: Not on file  . Number of children: Not on file  . Years of education: Not on file  . Highest education level: Not on file    Occupational History  . Occupation: Publishing rights manager: [employer]  . Financial resource strain: Not on file  . Food insecurity:    Worry: Not on file    Inability: Not on file  . Transportation needs:    Medical: Not on file    Non-medical: Not on file  Tobacco Use  . Smoking status: Former Smoker    Packs/day: 1.00    Years: 24.00    Pack years: 24.00    Types: Cigarettes    Last attempt to quit: 10/15/2000    Years since quitting: 17.3  . Smokeless tobacco: Never Used  Substance and Sexual Activity  . Alcohol use: Yes    Comment: 01/15/2018 "1-2 drinks/month"  . Drug use: No  . Sexual activity: Not Currently  Lifestyle  . Physical activity:    Days per week: Not on file    Minutes per session: Not on file  . Stress: Not on file  Relationships  . Social connections:    Talks on phone: Not on file    Gets together: Not on file    Attends religious service: Not on file    Active member of club or organization: Not on file    Attends meetings of clubs or organizations: Not on file    Relationship status: Not on file  Other Topics Concern  . Not on file  Social History Narrative   The patient lives alone in [place].   He is on a diabetic diet with regular exercise at work, but little outside of his work.     Review of Systems: A 12 point ROS discussed and pertinent positives are indicated in the HPI above.  All other systems are negative.  Review of Systems  Constitutional: Positive for fatigue. Negative for chills and fever.  Respiratory: Negative for shortness of breath.   Cardiovascular: Negative for chest pain.  Gastrointestinal: Negative for abdominal pain, nausea and vomiting.  Neurological: Negative for dizziness.  Psychiatric/Behavioral: Negative for confusion.    Vital Signs: BP (!) 164/72 (BP Location: Left Arm)   Pulse 92   Temp 98.1 F (36.7 C) (Oral)  Resp 18   Ht 5' 10" (1.778 m)   Wt 207 lb 3.7 oz  (94 kg)   SpO2 100%   BMI 29.73 kg/m   Physical Exam  Constitutional: He is oriented to person, place, and time. No distress.  HENT:  Head: Normocephalic and atraumatic.  Cardiovascular: Normal rate, regular rhythm and normal heart sounds.  Pulmonary/Chest: Effort normal and breath sounds normal.  1L O2 via [place]  Abdominal: Soft. He exhibits no distension. There is no tenderness.  Neurological: He is alert and oriented to person, place, and time.  Skin: He is not diaphoretic.  Psychiatric: He has a normal mood and affect. His behavior is normal. Judgment and thought content normal.  Nursing note and vitals reviewed.    MD Evaluation Airway: WNL Heart: WNL Abdomen: WNL Chest/ Lungs: WNL ASA  Classification: 3 Mallampati/Airway Score: One   Imaging: Dg Abd 1 View  Result Date: 01/28/2018 CLINICAL DATA:  Abdominal pain EXAM: ABDOMEN - 1 VIEW COMPARISON:  CT abdomen pelvis of 01/14/2018 FINDINGS: A supine film of the abdomen shows the lumbar spine to be somewhat rotated to the right. No bowel obstruction is seen. However there is paucity of bowel gas in the left abdomen of doubtful significance in view of the recent CT scan showing no mass. Calcified vas deferens are noted in the pelvis. IMPRESSION: 1. No bowel obstruction. 2. Paucity of bowel gas in the left abdomen of doubtful significance. 3. Calcified vas deferens. Electronically Signed   By: Ivar Drape M.D.   On: 01/28/2018 11:24   Ct Head Wo Contrast  Result Date: 01/14/2018 CLINICAL DATA:  Unexplained altered level consciousness. Slurred speech. Nausea. Frontal headache. Nausea and vomiting. EXAM: CT HEAD WITHOUT CONTRAST TECHNIQUE: Contiguous axial images were obtained from the base of the skull through the vertex without intravenous contrast. COMPARISON:  07/27/2014 FINDINGS: Brain: No evidence of acute infarction, hemorrhage, hydrocephalus, extra-axial collection, or mass lesion/mass effect. Mild generalized cerebral atrophy,  without significant change. Vascular:  No hyperdense vessel or other acute findings. Skull: No evidence of fracture or other significant bone abnormality. Sinuses/Orbits:  No acute findings. Other: None. IMPRESSION: No acute intracranial abnormality.  Mild cerebral atrophy. Electronically Signed   By: Earle Gell M.D.   On: 01/14/2018 20:05   Ct Abdomen Pelvis W Contrast  Result Date: 01/14/2018 CLINICAL DATA:  Acute abdominal pain. EXAM: CT ABDOMEN AND PELVIS WITH CONTRAST TECHNIQUE: Multidetector CT imaging of the abdomen and pelvis was performed using the standard protocol following bolus administration of intravenous contrast. CONTRAST:  124m ISOVUE-300 IOPAMIDOL (ISOVUE-300) INJECTION 61% COMPARISON:  None. FINDINGS: Lower chest: Motion artifact through the lung bases. Mild dependent atelectasis on the left. Heart appears prominent size. There are coronary artery calcifications. Hepatobiliary: Decreased hepatic density consistent with steatosis. No discrete focal lesion. Hyperdensity at the gallbladder neck may be stones or sludge. No pericholecystic inflammation. No biliary dilatation. Pancreas: Fatty atrophy.  No ductal dilatation or inflammation. Spleen: Normal in size. Peripheral slightly wedge-shaped low-density in the posterior spleen measures approximately 4 cm in depth. No perisplenic fluid. Adrenals/Urinary Tract: Mild thickening left adrenal gland without dominant nodule. Normal right adrenal gland. No hydronephrosis. Symmetric bilateral perinephric edema. Homogeneous renal enhancement. Possible small exophytic cyst from the mid left kidney versus perinephric edema. Urinary bladder is physiologically distended. No bladder wall thickening. Stomach/Bowel: Small hiatal hernia. Motion artifact and lack of enteric contrast limits detailed bowel assessment. Allowing for this, no evidence of obstruction, inflammation or bowel wall thickening. Normal appendix. Vascular/Lymphatic:  Moderate aorto bi-iliac  atherosclerosis without aneurysm. Portal vein and mesenteric vessels are patent. No enlarged abdominal or pelvic lymph nodes. Reproductive: Prostate is unremarkable. Other: No free air or free fluid. Musculoskeletal: Scoliosis and degenerative change throughout the lumbar spine. Slight flattening of the left femoral head may be chronic. No acute osseous abnormality, particularly, no left rib fractures. IMPRESSION: 1. 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 No perisplenic fluid. 2. Symmetric bilateral perinephric edema, likely chronic but can be seen in the setting of urinary tract infection. 3. Gallstones or sludge without gallbladder inflammation on CT. 4. Incidental hepatic steatosis and Aortic Atherosclerosis (ICD10-I70.0). Electronically Signed   By: Keith Rake M.D.   On: 01/14/2018 22:19   US Renal  Result Date: 01/27/2018 CLINICAL DATA:  62 year old male with acute renal failure. EXAM: RENAL / URINARY TRACT ULTRASOUND COMPLETE COMPARISON:  CT of abdomen pelvis dated 01/14/2018 FINDINGS: Right Kidney: Length: 12.2 cm. Normal echogenicity. No hydronephrosis or shadowing stone. Left Kidney: Length: 11.7 cm. Normal echogenicity. No hydronephrosis or shadowing stone. Bladder: There is layering debris in the urinary bladder. Correlation with urinalysis recommended. The liver is slightly echogenic likely fatty infiltration. There is probable Riedel's lobe anatomy. IMPRESSION: 1. No hydronephrosis or shadowing stone. 2. Layering debris within the urinary bladder. Correlation with urinalysis recommended. Electronically Signed   By: Anner Crete M.D.   On: 01/27/2018 22:38   Mr Foot Right W Wo Contrast  Result Date: 01/16/2018 CLINICAL DATA:  Necrotic gangrenous ulcer on the lateral aspect of the right foot with ascending cellulitis. EXAM: MRI OF THE RIGHT FOREFOOT WITHOUT AND WITH CONTRAST TECHNIQUE: Multiplanar, multisequence MR imaging of the right foot was  performed before and after the administration of intravenous contrast. CONTRAST:  10 cc Gadavist COMPARISON:  Radiographs 01/15/2018 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 metatarsal level. I do not see any marrow signal abnormality or enhancement to suggest osteomyelitis. No findings for septic arthritis. No subcutaneous soft tissue swelling/edema/fluid surrounding the foot suggesting cellulitis but no discrete drainable soft tissue abscess or findings for pyomyositis. IMPRESSION: 1. Changes suggesting cellulitis but no discrete drainable soft tissue abscess. 2. No MR findings for septic arthritis, osteomyelitis or pyomyositis. Electronically Signed   By: Marijo Sanes M.D.   On: 01/16/2018 10:53   Dg Chest Port 1 View  Result Date: 02/10/2018 CLINICAL DATA:  Shortness of breath. EXAM: PORTABLE CHEST 1 VIEW COMPARISON:  01/31/2018 FINDINGS: Right dialysis catheter tip in the mid SVC. Right upper extremity PICC tip in the mid distal SVC. Element of bilateral perihilar opacities and Kerley B-lines consistent with pulmonary edema. Increased bilateral pleural effusions. Cardiomegaly is unchanged. Prosthetic aortic valve. No pneumothorax. IMPRESSION: CHF with moderate pulmonary edema and pleural effusions. Cardiomegaly appears similar. Electronically Signed   By: Keith Rake M.D.   On: 02/10/2018 05:50   Dg Chest Port 1 View  Result Date: 01/31/2018 CLINICAL DATA:  Acute respiratory failure EXAM: PORTABLE CHEST 1 VIEW COMPARISON:  01/29/2018 FINDINGS: Cardiac shadow is stable. Postoperative changes are seen. Right jugular central line is noted in satisfactory position. The right-sided PICC line is also noted in stable position. The lungs are well aerated with minimal left basilar atelectasis. No pneumothorax is seen. No bony abnormality is noted. IMPRESSION: Mild left basilar atelectasis. Tubes and lines as described. Electronically Signed   By: Inez Catalina M.D.   On:  01/31/2018 08:12   Dg Chest Port 1 View  Result Date: 01/29/2018 CLINICAL DATA:  Severe shortness of breath. EXAM: PORTABLE CHEST 1 VIEW COMPARISON:  01/27/2018. FINDINGS: Stable enlarged cardiac silhouette, post CABG changes and prosthetic aortic valve. Interval right jugular catheter with its tip in the superior vena cava. No pneumothorax. Stable right PICC with its tip in the inferior aspect of the superior vena cava. Increased prominence of the interstitial markings, including Kerley lines with an interval small left pleural effusion. Stable left lung postsurgical changes. No acute bony abnormality. IMPRESSION: Interval changes of congestive heart failure with stable cardiomegaly. Electronically Signed   By: Claudie Revering M.D.   On: 01/29/2018 16:24   Dg Chest Portable 1 View  Result Date: 01/27/2018 CLINICAL DATA:  Intermittent LEFT chest pain.  Oliguric. EXAM: PORTABLE CHEST 1 VIEW COMPARISON:  Chest radiograph January 15, 2018 FINDINGS: RIGHT PICC distal tip projects in distal superior vena cava. No pneumothorax. Stable mild cardiomegaly. Status post aortic valve replacement. LEFT mid lung zone bandlike density with mildly elevated LEFT hemidiaphragm. No focal consolidation. Blunting of the LEFT costophrenic angle. No pneumothorax. Soft tissue planes and included osseous structures are non suspicious. Surgical clips bilateral axilla. IMPRESSION: 1. RIGHT PICC distal tip projecting in distal superior vena cava. No pneumothorax. 2. LEFT lung base atelectasis with small pleural effusion versus pleural thickening. 3. Stable cardiomegaly. Electronically Signed   By: Elon Alas M.D.   On: 01/27/2018 18:43   Dg Chest Port 1 View  Result Date: 01/15/2018 CLINICAL DATA:  Severe sepsis. EXAM: PORTABLE CHEST 1 VIEW COMPARISON:  Radiograph 12/08/2014 FINDINGS: Stable cardiomegaly. Post CABG with prosthetic valve. No sternal wires visualized. No pulmonary edema. Scattered atelectasis. No confluent  airspace disease, pleural effusion or pneumothorax. No acute osseous abnormalities. IMPRESSION: Chronic cardiomegaly with scattered atelectasis. Electronically Signed   By: Keith Rake M.D.   On: 01/15/2018 00:20   Dg Abd 2 Views  Result Date: 02/03/2018 CLINICAL DATA:  Abdominal pain and distension EXAM: ABDOMEN - 2 VIEW COMPARISON:  January 28, 2018 FINDINGS: Supine and left lateral decubitus images were obtained. There is moderate stool throughout colon. There is no appreciable bowel dilatation or air-fluid levels to suggest bowel obstruction. No evident free air. There are vascular calcifications in the pelvis. Visualized lung bases are clear. IMPRESSION: Moderate stool in colon. No evident bowel obstruction or free air. Visualized lung bases clear. Electronically Signed   By: Lowella Grip III M.D.   On: 02/03/2018 10:06   Dg Foot Complete Right  Result Date: 01/15/2018 CLINICAL DATA:  RIGHT foot infection, sepsis, prior amputation EXAM: RIGHT FOOT COMPLETE - 3+ VIEW COMPARISON:  04/06/2009 FINDINGS: Osseous demineralization. Prior transmetatarsal amputation. Joint spaces preserved. Mild dorsal spur formation at the talonavicular joint. No acute fracture, dislocation or bone destruction. Motion artifacts degrade lateral view, no gross osseous abnormalities identified. Scattered soft tissue swelling without soft tissue gas. IMPRESSION: Transmetatarsal amputation RIGHT foot. No acute osseous findings. Electronically Signed   By: Lavonia Dana M.D.   On: 01/15/2018 00:57   Ct Renal Stone Study  Result Date: 01/29/2018 CLINICAL DATA:  anuria EXAM: CT ABDOMEN AND PELVIS WITHOUT CONTRAST TECHNIQUE: Multidetector CT imaging of the abdomen and pelvis was performed following the standard protocol without oral or IV contrast. COMPARISON:  January 14, 2018 FINDINGS: Lower chest: There are pleural effusions bilaterally with consolidation in the posterior lung bases. There are foci of coronary artery  calcification. There is a small hiatal hernia. Hepatobiliary: There is hepatic steatosis. No focal liver lesions are appreciable. Gallbladder is mildly distended. There are gallstones present. There  may also be sludge in the gallbladder. There is no appreciable gallbladder wall thickening. There is no pericholecystic fluid. Pancreas: There is fatty infiltration in the pancreas. No focal pancreatic lesions are evident. Spleen: There is decreased attenuation in the posterior spleen in a wedge-shaped configuration consistent with splenic infarct, felt to be stable from most recent study. No new splenic lesions are evident on this noncontrast enhanced study. Adrenals/Urinary Tract: Adrenals bilaterally appear normal. There is no evident renal mass or hydronephrosis on either side. There is no appreciable renal or ureteral calculus on either side. There is a Foley catheter within the urinary bladder. There is wall thickening in portions of the urinary bladder anteriorly. Air within the urinary bladder may well be iatrogenic given recent catheterization. Stomach/Bowel: There is no appreciable bowel wall or mesenteric thickening. No evident bowel obstruction. There is no appreciable free air or portal venous air. Vascular/Lymphatic: There is aortic atherosclerosis. There is also atherosclerotic calcification in the common iliac arteries. No aneurysm evident. Major mesenteric arterial vessels are felt to be patent, although there are foci of atherosclerotic calcification in mesenteric vessels. There is no appreciable adenopathy in the abdomen or pelvis. Reproductive: Prostate and seminal vesicles are normal in size and contour. There is calcification in the seminal vesicles. No pelvic masses evident. Other: There is ascites in the abdomen and pelvis with areas of apparent loculation of ascites in the pelvis. No abscess is evident in the abdomen or pelvis. Appendix appears unremarkable. There is mesenteric thickening in the  left lower abdomen, near in area of loculated ascites. There is generalized anasarca. Musculoskeletal: Degenerative change throughout the lower thoracic and lumbar regions remain stable. There is loss of cortex with soft tissue thickening along the anterior, inferior aspect of T12 with loss of disc space in this area. There is also lucency along the posterior aspect of the L1 vertebral body, stable. A degree of discitis/osteomyelitis in this area must be of concern. No intramuscular lesions are evident. IMPRESSION: 1. Loss of cortex along the anterior inferior aspect of the T12 vertebral body as well as lucency in the posterior aspect of the L1 vertebral body. Question a degree of discitis/osteomyelitis in this region. This finding may warrant MR pre and post-contrast to further evaluate with respect to discitis/bony destruction. 2. Generalized anasarca. Areas of partially loculated ascites as well as mild mesenteric thickening in the left lower quadrant. No evident abscess in the abdomen or pelvis. 3. Cholelithiasis with probable sludge in the gallbladder as well. Gallbladder mildly distended. Gallbladder wall not thickened. 4. No evident hydronephrosis on either side. No renal or ureteral calculus on either side. 5. Urinary bladder decompressed. Air within the urinary bladder likely is due to recent catheterization. Correlation with urinalysis to assess for possible gas-forming organism is a cause for air within the bladder is advised. 6.  No bowel obstruction.  Appendix region appears normal. 7.  Small hiatal hernia. 8.  Aortoiliac atherosclerosis.  Coronary artery calcification. 9.  Bilateral pleural effusions with bibasilar consolidation. 10. 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 Aortic Atherosclerosis (ICD10-I70.0). Electronically Signed   By: Lowella Grip III M.D.   On: 01/29/2018 07:08   [place] Ekg Site Rite  Result Date: 01/21/2018 If Site Rite image not attached, placement  could not be confirmed due to current cardiac rhythm.   Labs:  CBC: Recent Labs    02/02/18 0426 02/05/18 1430 02/09/18 0340 02/10/18 0714  WBC 10.9* 10.2 11.8* 13.0*  HGB 9.0* 8.8* 8.5* 8.7*  HCT 28.1* 27.5* 27.1* 27.8*  PLT 151 121* 142* 140*    COAGS: Recent Labs    01/14/18 1926  02/06/18 0503 02/07/18 0400 02/08/18 0334 02/09/18 0340  INR 1.43   < > 2.40 2.34 2.76 2.68  APTT 33  --   --   --   --   --    < > = values in this interval not displayed.    BMP: Recent Labs    02/07/18 0400 02/08/18 0334 02/09/18 0340 02/10/18 0713  NA 132* 133* 132* 129*  K 4.6 3.7 3.9 3.7  CL 97* 96* 97* 95*  CO2 _0 GLUCOSE 168* 150* 117* 68*  BUN 57* 40* 54* 74*  CALCIUM 7.0* 7.0* 7.0* 6.5*  CREATININE 7.01* 5.17* 6.47* 7.33*  GFRNONAA 8* 11* 8* 7*  GFRAA 9* 13* 10* 8*    LIVER FUNCTION TESTS: Recent Labs    01/14/18 1926 01/15/18 0246 01/27/18 1819  02/07/18 0400 02/08/18 0334 02/09/18 0340 02/10/18 0713  BILITOT 1.4* 0.8 0.5  --   --   --   --   --   AST 36 32 19  --   --   --   --   --   ALT 20 17 <5  --   --   --   --   --   ALKPHOS 78 47 66  --   --   --   --   --   PROT 7.1 5.0* 5.8*  --   --   --   --   --   ALBUMIN 3.8 2.6* 2.2*   < > 2.0* 1.8* 1.9* 2.1*   < > = values in this interval not displayed.    TUMOR MARKERS: No results for input(s): AFPTM, CEA, CA199, CHROMGRNA in the last 8760 hours.  Assessment and Plan:  Request to IR for tunneled HD catheter placement due to AKI - currently with temporary HD placed 9/25 by critical care and is receiving HD without issues per chart. Patient currently taking coumadin 2.5 mg, INR 2.72 today - discussed with Dr. Sarajane Jews who will hold coumadin x 4 days; will repeat INR prior to procedure for goal of <1.5. Tentatively planned for tunneled HD placement in IR on 10/11 at this time.  Risks and benefits discussed with the patient including, but not limited to bleeding, infection, vascular injury,  pneumothorax which may require chest tube placement, air embolism or even death.  All of the patient's questions were answered, patient is agreeable to proceed.  Consent signed and in chart.  Thank you for this interesting consult.  I greatly enjoyed meeting [name] and look forward to participating in their care.  A copy of this report was sent to the requesting provider on this date.  Electronically Signed: Joaquim Nam, PA-C 02/10/2018, 11:49 AM   I spent a total of 40 Minutes  in face to face in clinical consultation, greater than 50% of which was counseling/coordinating care for tunneled HD catheter placement.

## 2018-02-10 NOTE — Progress Notes (Signed)
[place] for warfarin  Indication: atrial fibrillation  Allergies  Allergen Reactions  . Ace Inhibitors Cough    Patient Measurements: Height: 5\' 10"  (177.8 cm) Weight: 207 lb 3.7 oz (94 kg) IBW/kg (Calculated) : 73   Vital Signs: Temp: 98.1 F (36.7 C) (10/07 1029) Temp Source: Oral (10/07 1029) BP: 164/72 (10/07 1111) Pulse Rate: 92 (10/07 1111)  Labs: Recent Labs    02/08/18 0334 02/09/18 0340 02/10/18 0713 02/10/18 0714 02/10/18 1135  HGB  --  8.5*  --  8.7*  --   HCT  --  27.1*  --  27.8*  --   PLT  --  142*  --  140*  --   LABPROT 29.0* 28.3*  --   --  28.6*  INR 2.76 2.68  --   --  2.72  CREATININE 5.17* 6.47* 7.33*  --   --   CKTOTAL 105  --   --   --   --     Estimated Creatinine Clearance: 12.2 mL/min (A) (by C-G formula based on SCr of 7.33 mg/dL (H)).   Medical History: Past Medical History:  Diagnosis Date  . Bicuspid aortic valve    a. 02/2009 s/p tissue AVR;  b. 07/2014 Echo: EF 55-60%, basal-mid inf HK, mild AS/MR, mod dil LA, PASP 44 mmHg.  Marland Kitchen CAD (coronary artery disease) October 2010   a. 02/2009 NSTEMI/Cath: 3VD; b. 02/2009 CABG x 5: LIMA->LAD, VG->Diag, VG->RI, VG->RVM->RPDA.  Marland Kitchen GERD (gastroesophageal reflux disease)   . Heart murmur   . Hyperlipidemia   . Hypertension   . Myocardial infarction ([place]) 2010  . PVD (peripheral vascular disease) ([place])    a. s/p toe amputations on L foot.  . Type II diabetes mellitus [facility])     Assessment: 62 yo M with new onset PAF on warfarin (started on 10/2).  -INR today = 2.72  Goal of Therapy:  INR 2-3 Monitor platelets by anticoagulation protocol: Yes   Plan:  -Coumadin 2.5 mg po today -Daily PT/INR  Arlinda Barcelona A. Levada Dy, PharmD, [place] Pager: [phone] Please utilize Amion for appropriate phone number to reach the unit pharmacist ([address])

## 2018-02-10 NOTE — Progress Notes (Signed)
PROGRESS NOTE  Miachel King FHL:[id number] DOB: [birth year] DOA: 01/27/2018 PCP: [name], MD  Brief Narrative: 62 year old man complicated PMH hospitalized September for sepsis secondary to right foot osteomyelitis, underwent BKA, found to have MSSA bacteremia with splenic infarct and tricuspid valve endocarditis, discharged on Ancef.  Developed acute oliguria and presented to the emergency department where he was found to have acute kidney injury.  Seen by nephrology and infectious disease, antibiotics changed to daptomycin.  Developed acute respiratory failure necessitating transfer to ICU and critical care service.  Temporary dialysis catheter was placed and the patient was started on hemodialysis.  Respiratory status improved and he was transferred back out to the hospital service.  Assessment/Plan AKI, oliguric, no clear nephrotoxic events, working diagnosis ATN versus AIN from Ancef.  CT renal stone protocol unrevealing.   --Not improving.  Nephrology plans for tunneled catheter placement.  Continue hemodialysis per nephrology.  PAF with rapid ventricular response, new diagnosis --Remains stable.  Continue metoprolol and warfarin.Marland Kitchen   --Follow-up with Dr. Percival Spanish 3-4 weeks after hospitalization.  Acute hypoxic respiratory failure secondary to volume overload, pulmonary edema --Stable on 1 L.  Wean as tolerated.  Diabetes mellitus type 2, peripheral vascular disease status post bilateral BKA --CBG stable.  Continue Lantus, NovoLog meal coverage and sliding scale insulin.   --Staple removal perhaps tomorrow  MSSA bacteremia, tricuspid valve endocarditis, splenic infarct diagnosed 9/17 --Continue daptomycin per infectious disease  Oral candidiasis --Treated with fluconazole  Thrombocytopenia, stable.  Seen September 2019 as well.  Improving.  Anemia of critical illness, stable.  Obesity --Continue management per dietician  Loss of cortex along the anterior  inferior aspect of the T12 vertebral body as well as lucency in the posterior aspect of the L1 vertebral body. Question a degree of discitis/osteomyelitis in this region. This finding may warrant MR pre and post-contrast to further evaluate with respect to discitis/bony destruction. --Discussed with Dr. [name].  No change to therapy recommended.  Continue daptomycin.  Aortic atherosclerosis --Follow-up as an outpatient   Continue current management  DVT prophylaxis: warfarin Code Status: Full Family Communication: none Disposition Plan: pending    Raymond Hodgkins, MD  [facility] Direct contact: [phone] --Via amion app OR  --[email or web address]; password [email or web address]  7PM-7AM contact night coverage as above 02/10/2018, 12:18 PM  LOS: 14 days   Consultants:  PCCM  Nephrology  ID  Cardiology   Procedures:  Temporary HD catheter  HD  Antimicrobials:  Daptomycin   Interval history/Subjective: Was short of breath this morning but this improved after hemodialysis.  Eating well.  No diarrhea.  Objective: Vitals:  Vitals:   02/10/18 1029 02/10/18 1111  BP: (!) 165/84 (!) 164/72  Pulse: 91 92  Resp: 16 18  Temp: 98.1 F (36.7 C)   SpO2: 98% 100%    Exam: Constitutional:   . Appears calm. Appears mildly uncomfortable Respiratory:  . CTA bilaterally, no w/r/r.  . Mild increased resp effort Cardiovascular:  . RRR, no m/r/g . No stump edema   Abdomen:  . soft Psychiatric:  . Mental status o Mood, affect appropriate  I have personally reviewed the following:   Data: . I/O o UOP: 100 o BM: 10/5  CBG: Stable, no lows.  Labs: Hemoglobin stable 8.7.  WBC stable 13.0.  Platelets stable 140.  BMP noted.  Scheduled Meds: . Chlorhexidine Gluconate Cloth  6 each Topical Q0600  . feeding supplement (PRO-STAT SUGAR FREE 64)  30 mL Oral Daily  . Gerhardt's butt cream  Topical TID  . insulin aspart  0-5 Units Subcutaneous QHS  . insulin aspart  0-9 Units  Subcutaneous TID WC  . insulin aspart  4 Units Subcutaneous TID WC  . insulin glargine  22 Units Subcutaneous QHS  . latanoprost  1 drop Both Eyes QHS  . mouth rinse  15 mL Mouth Rinse BID  . metoprolol tartrate  75 mg Oral BID  . nystatin   Topical BID  . pantoprazole  40 mg Oral Q1200  . Warfarin - Pharmacist Dosing Inpatient   Does not apply q1800   Continuous Infusions: . sodium chloride 250 mL (02/07/18 2018)  . DAPTOmycin (CUBICIN)  IV 700 mg (02/09/18 2021)    Principal Problem:   Acute renal failure (ARF) (HCC) Active Problems:   Insulin-requiring or dependent type II diabetes mellitus ([place])   Essential hypertension   CAD (coronary artery disease)   Leukocytosis   Thrombocytopenia (HCC)   S/P BKA (below knee amputation) unilateral, right (HCC)   Endocarditis of tricuspid valve   Hyponatremia   PAF (paroxysmal atrial fibrillation) ([place])   Aortic atherosclerosis ([place])   Acute hypoxemic respiratory failure ([place])   LOS: 14 days

## 2018-02-11 ENCOUNTER — Inpatient Hospital Stay (HOSPITAL_COMMUNITY): Payer: Medicare Other

## 2018-02-11 ENCOUNTER — Encounter: Payer: Self-pay | Admitting: *Deleted

## 2018-02-11 LAB — RENAL FUNCTION PANEL
ALBUMIN: 1.9 g/dL — AB (ref 3.5–5.0)
ANION GAP: 11 (ref 5–15)
BUN: 45 mg/dL — AB (ref 8–23)
CALCIUM: 6.9 mg/dL — AB (ref 8.9–10.3)
CO2: 24 mmol/L (ref 22–32)
Chloride: 98 mmol/L (ref 98–111)
Creatinine, Ser: 5.04 mg/dL — ABNORMAL HIGH (ref 0.61–1.24)
GFR calc Af Amer: 13 mL/min — ABNORMAL LOW (ref >60)
GFR calc non Af Amer: 11 mL/min — ABNORMAL LOW (ref >60)
GLUCOSE: 148 mg/dL — AB (ref 70–99)
PHOSPHORUS: 5.4 mg/dL — AB (ref 2.5–4.6)
POTASSIUM: 4.3 mmol/L (ref 3.5–5.1)
SODIUM: 133 mmol/L — AB (ref 135–145)

## 2018-02-11 LAB — GLUCOSE, CAPILLARY
GLUCOSE-CAPILLARY: 147 mg/dL — AB (ref 70–99)
GLUCOSE-CAPILLARY: 79 mg/dL (ref 70–99)
GLUCOSE-CAPILLARY: 143 mg/dL — AB (ref 70–99)
Glucose-Capillary: 147 mg/dL — ABNORMAL HIGH (ref 70–99)

## 2018-02-11 LAB — PROTIME-INR
INR: 2.21
Prothrombin Time: 24.3 seconds — ABNORMAL HIGH (ref 11.4–15.2)

## 2018-02-11 MED ORDER — PHYTONADIONE 1 MG/0.5 ML ORAL SOLUTION
1.0000 mg | Freq: Once | ORAL | Status: AC
  Administered 2018-02-11: 1 mg via ORAL
  Filled 2018-02-11: qty 0.5

## 2018-02-11 MED ORDER — TRAZODONE HCL 50 MG PO TABS
50.0000 mg | ORAL_TABLET | Freq: Every day | ORAL | Status: DC
  Administered 2018-02-11 – 2018-03-10 (×26): 50 mg via ORAL
  Filled 2018-02-11 (×27): qty 1

## 2018-02-11 NOTE — Care Management (Addendum)
Only [facility] offered pt a bed.  CM discussed LTACH option with pt - pt interested - [facility] to met pt at bedside tomorrow  Attending in agreement with [facility] referral.  CM gave both [facility] and [facility] referral - both agencies to follow back up with CM regarding bed offers.  Pt discsussed during LOS meeting 02/11/18 - potential LTACH referral discussed.  Pt readmit after discharging to SNF on Ancef - now requiring IV Daptomycin..  Pt now with AKI - plan on having TDC placed by IR for intermittent HD.  CM reached out to attending to discuss potential LTACH referral.

## 2018-02-11 NOTE — Progress Notes (Signed)
[facility] ROUNDING NOTE   Subjective:    HD yesterday, tolerated well.  3 L ultrafiltration  Seen by IR for tunneled hemodialysis catheter, 0.4 L this week and given warfarin, INR 2.2 today  Afebrile, blood pressure stable  Objective:  Vital signs in last 24 hours:  Temp:  [97.6 F (36.4 C)-98.7 F (37.1 C)] 98.7 F (37.1 C) (10/08 0939) Pulse Rate:  [88-91] 91 (10/08 0939) Resp:  [18-20] 20 (10/08 0939) BP: (139-158)/(70-79) 148/77 (10/08 0939) SpO2:  [93 %-98 %] 93 % (10/08 0939)  Weight change:  Filed Weights   02/07/18 0655 02/07/18 1038 02/08/18 0435  Weight: 96 kg 92.5 kg 94 kg    Intake/Output: I/O last 3 completed shifts: In: 758.9 [P.O.:540; I.V.:43.5; IV Piggyback:175.5] Out: 5638 [Urine:250; Other:2974]   Intake/Output this shift:  Total I/O In: 100 [P.O.:100] Out: -   CVS- RRR systolic murmur RS-  Normal WOB and clear anteriorly ABD-soft, mod distented abdomen, +BS EXT-generalized edema trace  Access - RIJ trialysis   Basic Metabolic Panel: Recent Labs  Lab 02/07/18 0400 02/08/18 0334 02/09/18 0340 02/10/18 0713 02/11/18 0320  NA 132* 133* 132* 129* 133*  K 4.6 3.7 3.9 3.7 4.3  CL 97* 96* 97* 95* 98  CO2 24 26 24 22 24   GLUCOSE 168* 150* 117* 68* 148*  BUN 57* 40* 54* 74* 45*  CREATININE 7.01* 5.17* 6.47* 7.33* 5.04*  CALCIUM 7.0* 7.0* 7.0* 6.5* 6.9*  PHOS 5.9* 4.9* 5.9* 6.3* 5.4*    Liver Function Tests: Recent Labs  Lab 02/07/18 0400 02/08/18 0334 02/09/18 0340 02/10/18 0713 02/11/18 0320  ALBUMIN 2.0* 1.8* 1.9* 2.1* 1.9*   No results for input(s): LIPASE, AMYLASE in the last 168 hours. No results for input(s): AMMONIA in the last 168 hours.  CBC: Recent Labs  Lab 02/05/18 1430 02/09/18 0340 02/10/18 0714  WBC 10.2 11.8* 13.0*  HGB 8.8* 8.5* 8.7*  HCT 27.5* 27.1* 27.8*  MCV 90.5 91.2 89.4  PLT 121* 142* 140*    Cardiac Enzymes: Recent Labs  Lab 02/06/18 0503 02/08/18 0334  CKTOTAL 239 105     BNP: Invalid input(s): POCBNP  CBG: Recent Labs  Lab 02/09/18 2225 02/10/18 1108 02/10/18 1631 02/10/18 2148 02/11/18 0805  GLUCAP 203* 76 113* 141* 147*    Microbiology: Results for orders placed or performed during the hospital encounter of 01/27/18  Urine culture     Status: None   Collection Time: 01/28/18  4:29 PM  Result Value Ref Range Status   Specimen Description URINE, CATHETERIZED  Final   Special Requests NONE  Final   Culture   Final    NO GROWTH Performed at [facility] [address], [place], [place] [address]    Report Status 01/29/2018 FINAL  Final    Coagulation Studies: Recent Labs    02/09/18 0340 02/10/18 1135 02/11/18 0320  LABPROT 28.3* 28.6* 24.3*  INR 2.68 2.72 2.21    Urinalysis: No results for input(s): COLORURINE, LABSPEC, PHURINE, GLUCOSEU, HGBUR, BILIRUBINUR, KETONESUR, PROTEINUR, UROBILINOGEN, NITRITE, LEUKOCYTESUR in the last 72 hours.  Invalid input(s): APPERANCEUR    Imaging: Dg Chest Port 1 View  Result Date: 02/10/2018 CLINICAL DATA:  Short of breath. EXAM: PORTABLE CHEST 1 VIEW COMPARISON:  02/10/2018 at 5:34 a.m. FINDINGS: Hazy central and lower lung airspace opacities have mildly improved. Interstitial thickening has improved. Small pleural effusions appear decreased since the prior exam. Right internal jugular central venous line and right PICC are stable. There stable changes  from cardiac surgery. IMPRESSION: 1. Interval improvement in congestive heart failure and pulmonary edema. There is still mild residual airspace edema and small effusions. No new abnormalities. Electronically Signed   By: Lajean Manes M.D.   On: 02/10/2018 20:59   Dg Chest Port 1 View  Result Date: 02/10/2018 CLINICAL DATA:  Shortness of breath. EXAM: PORTABLE CHEST 1 VIEW COMPARISON:  01/31/2018 FINDINGS: Right dialysis catheter tip in the mid SVC. Right upper extremity PICC tip in the mid distal SVC. Element of bilateral perihilar  opacities and Kerley B-lines consistent with pulmonary edema. Increased bilateral pleural effusions. Cardiomegaly is unchanged. Prosthetic aortic valve. No pneumothorax. IMPRESSION: CHF with moderate pulmonary edema and pleural effusions. Cardiomegaly appears similar. Electronically Signed   By: Keith Rake M.D.   On: 02/10/2018 05:50     Medications:   . sodium chloride 250 mL (02/10/18 1955)  . DAPTOmycin (CUBICIN)  IV 700 mg (02/09/18 2021)   . Chlorhexidine Gluconate Cloth  6 each Topical Q0600  . feeding supplement (PRO-STAT SUGAR FREE 64)  30 mL Oral Daily  . Gerhardt's butt cream   Topical TID  . insulin aspart  0-5 Units Subcutaneous QHS  . insulin aspart  0-9 Units Subcutaneous TID WC  . insulin aspart  4 Units Subcutaneous TID WC  . insulin glargine  22 Units Subcutaneous QHS  . latanoprost  1 drop Both Eyes QHS  . mouth rinse  15 mL Mouth Rinse BID  . metoprolol tartrate  75 mg Oral BID  . nystatin   Topical BID  . pantoprazole  40 mg Oral Q1200  . phytonadione  1 mg Oral Once  . traZODone  50 mg Oral QHS   sodium chloride, acetaminophen **OR** acetaminophen, clonazepam, ondansetron **OR** ondansetron (ZOFRAN) IV, sodium chloride flush  Assessment/ Plan:    1.  AKI, severe, oliguric:  Cr 01/22/18 1.13 but 9/23 9.6.  He has no clear nephrotoxic events (did have low PO intake days prior to presentation) but working diagnosis is ATN versus AIN from ancef which has been discontinued.  Empiric hydration was not helpful. CT renal stone protocol unrevealing.  He initiated RRT for volume overload.  Doubt acute glomerular process.  Continue to provide supportive care and await renal recovery which is still possible.  Tentative for dialysis tomorrow 2K, Tight heparin, 3L UF, 4h.  IR following for placement of tunneled dialysis catheter.   2. Atrial fibrillation with RVR:  Currently in NSR with normal BP.  On metoprolol 75 BID. Coumadin on hold for procedure.  Cardiology following.    3.  Anemia: Hemoglobin stable in the eights, might require ESA.  Avoid iron given bacteremia, transfuse PRN  4. 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 Algis Downs input for infectious disease started 01/30/2018 daptomycin 700 mg every 48 hours  5.  Diabetes mellitus per primary team     LOS: 15 Duana Benedict B @TODAY @11 :30 AM

## 2018-02-11 NOTE — Progress Notes (Signed)
[place] for warfarin  Indication: atrial fibrillation  Allergies  Allergen Reactions  . Ace Inhibitors Cough    Patient Measurements: Height: 5\' 10"  (177.8 cm) Weight: 207 lb 3.7 oz (94 kg) IBW/kg (Calculated) : 73   Vital Signs: Temp: 98.7 F (37.1 C) (10/08 0939) Temp Source: Oral (10/08 0939) BP: 148/77 (10/08 0939) Pulse Rate: 91 (10/08 0939)  Labs: Recent Labs    02/09/18 0340 02/10/18 0713 02/10/18 0714 02/10/18 1135 02/11/18 0320  HGB 8.5*  --  8.7*  --   --   HCT 27.1*  --  27.8*  --   --   PLT 142*  --  140*  --   --   LABPROT 28.3*  --   --  28.6* 24.3*  INR 2.68  --   --  2.72 2.21  CREATININE 6.47* 7.33*  --   --  5.04*    Estimated Creatinine Clearance: 17.7 mL/min (A) (by C-G formula based on SCr of 5.04 mg/dL (H)).   Medical History: Past Medical History:  Diagnosis Date  . Bicuspid aortic valve    a. 02/2009 s/p tissue AVR;  b. 07/2014 Echo: EF 55-60%, basal-mid inf HK, mild AS/MR, mod dil LA, PASP 44 mmHg.  Marland Kitchen CAD (coronary artery disease) October 2010   a. 02/2009 NSTEMI/Cath: 3VD; b. 02/2009 CABG x 5: LIMA->LAD, VG->Diag, VG->RI, VG->RVM->RPDA.  Marland Kitchen GERD (gastroesophageal reflux disease)   . Heart murmur   . Hyperlipidemia   . Hypertension   . Myocardial infarction ([place]) 2010  . PVD (peripheral vascular disease) ([place])    a. s/p toe amputations on L foot.  . Type II diabetes mellitus [facility])     Assessment: 62 yo M with new onset PAF on warfarin (started on 10/2).  -INR today = 2.21. Warfarin on hold for [facility] insertion. S/p Vit K x 1 with another order pending administration. Will f/u after the procedure.   Goal of Therapy:  INR 2-3 Monitor platelets by anticoagulation protocol: Yes   Plan:  -Hold warfarin for procedure -Vit K 1mg  PO x 1 today per MD -Will f/u for restart after the procedure.  -Daily PT/INR  [name] A. Levada Dy, PharmD, [place] Pager:  [phone] Please utilize Amion for appropriate phone number to reach the unit pharmacist ([place])

## 2018-02-11 NOTE — Progress Notes (Signed)
PROGRESS NOTE  [name] PJK:[id number] DOB: 1955-08-31 DOA: 01/27/2018 PCP: [name], MD  Brief Narrative: 62 year old man complicated PMH hospitalized September for sepsis secondary to right foot osteomyelitis, underwent BKA, found to have MSSA bacteremia with splenic infarct and tricuspid valve endocarditis, discharged on Ancef.  Developed acute oliguria and presented to the emergency department where he was found to have acute kidney injury.  Seen by nephrology and infectious disease, antibiotics changed to daptomycin.  Developed acute respiratory failure necessitating transfer to ICU and critical care service.  Temporary dialysis catheter was placed and the patient was started on hemodialysis.  Respiratory status improved and he was transferred back out to the hospital service.  Assessment/Plan AKI, oliguric, no clear nephrotoxic events, working diagnosis ATN versus AIN from Ancef.  CT renal stone protocol unrevealing.   --Continues to require hemodialysis.  Plan for tunneled catheter placement once INR acceptable.  Continue hemodialysis per nephrology. --Low-dose vitamin K again today, repeat INR in a.m.  PAF with rapid ventricular response, new diagnosis --Remained stable.  Continue metoprolol and warfarin.     --Follow-up with Dr. Percival Spanish 3-4 weeks after hospitalization.  Acute hypoxic respiratory failure secondary to volume overload, pulmonary edema --Resolved.  Diabetes mellitus type 2, peripheral vascular disease status post bilateral BKA --CBG remains stable.  Continue Lantus, NovoLog meal coverage and sliding scale insulin.   --Staple removal--I communicated with Dr. Sharol Given 10/7 and he is aware and will make arrangements to have staples removed at appropriate time.  MSSA bacteremia, tricuspid valve endocarditis, splenic infarct diagnosed 9/17 --Continue daptomycin per infectious disease  Oral candidiasis --Treated with fluconazole  Thrombocytopenia, stable.   Seen September 2019 as well.    Anemia of critical illness, stable.  Obesity --Continue management per dietician  Loss of cortex along the anterior inferior aspect of the T12 vertebral body as well as lucency in the posterior aspect of the L1 vertebral body. Question a degree of discitis/osteomyelitis in this region. This finding may warrant MR pre and post-contrast to further evaluate with respect to discitis/bony destruction. --Discussed with Dr. [name].  No change to therapy recommended.  Continue daptomycin.  Aortic atherosclerosis --Follow-up as an outpatient   Will re-dose low dose vitamin K, septic abdominal x-ray (no acute abnormalities noted).  DVT prophylaxis: warfarin Code Status: Full Family Communication: none Disposition Plan: pending    [name], MD  [facility] Direct contact: [phone] --Via amion app OR  --[email or web address]; password [email or web address]  7PM-7AM contact night coverage as above 02/11/2018, 4:35 PM  LOS: 15 days   Consultants:  PCCM  Nephrology  ID  Cardiology   Procedures:  Temporary HD catheter  HD  Antimicrobials:  Daptomycin   Interval history/Subjective: Was short of breath last night but feeling better today.  One episode of diarrhea.  Some mild generalized abdominal pain.  Ate a little bit of breakfast.  Objective: Vitals:  Vitals:   02/11/18 0545 02/11/18 0939  BP: (!) 152/77 (!) 148/77  Pulse: 88 91  Resp: 20 20  Temp: 98.3 F (36.8 C) 98.7 F (37.1 C)  SpO2: 97% 93%    Exam: Constitutional:   . Appears calm, uncomfortable, non-toxic Respiratory:  . CTA bilaterally, no w/r/r.  . Respiratory effort normal.  Cardiovascular:  . RRR, no m/r/g . No stump edema   Abdomen:  . Soft, mild generalized pain Psychiatric:  . Mental status o Mood, affect appropriate  I have personally reviewed the following:   Data:  CBG: Remains stable, no Lantus  Labs:  INR 2.21, BMP reviewed.  Scheduled Meds: .  Chlorhexidine Gluconate Cloth  6 each Topical Q0600  . feeding supplement (PRO-STAT SUGAR FREE 64)  30 mL Oral Daily  . Gerhardt's butt cream   Topical TID  . insulin aspart  0-5 Units Subcutaneous QHS  . insulin aspart  0-9 Units Subcutaneous TID WC  . insulin aspart  4 Units Subcutaneous TID WC  . insulin glargine  22 Units Subcutaneous QHS  . latanoprost  1 drop Both Eyes QHS  . mouth rinse  15 mL Mouth Rinse BID  . metoprolol tartrate  75 mg Oral BID  . nystatin   Topical BID  . pantoprazole  40 mg Oral Q1200  . traZODone  50 mg Oral QHS   Continuous Infusions: . sodium chloride 250 mL (02/10/18 1955)  . DAPTOmycin (CUBICIN)  IV 700 mg (02/09/18 2021)    Principal Problem:   Acute renal failure (ARF) (HCC) Active Problems:   Insulin-requiring or dependent type II diabetes mellitus ([place])   Essential hypertension   CAD (coronary artery disease)   Leukocytosis   Thrombocytopenia (HCC)   S/P BKA (below knee amputation) unilateral, right (HCC)   Endocarditis of tricuspid valve   Hyponatremia   PAF (paroxysmal atrial fibrillation) ([place])   Aortic atherosclerosis ([place])   Acute hypoxemic respiratory failure ([place])   LOS: 15 days

## 2018-02-12 LAB — RENAL FUNCTION PANEL
ALBUMIN: 2 g/dL — AB (ref 3.5–5.0)
Anion gap: 11 (ref 5–15)
BUN: 61 mg/dL — ABNORMAL HIGH (ref 8–23)
CALCIUM: 7 mg/dL — AB (ref 8.9–10.3)
CO2: 24 mmol/L (ref 22–32)
Chloride: 98 mmol/L (ref 98–111)
Creatinine, Ser: 5.81 mg/dL — ABNORMAL HIGH (ref 0.61–1.24)
GFR calc Af Amer: 11 mL/min — ABNORMAL LOW (ref >60)
GFR calc non Af Amer: 9 mL/min — ABNORMAL LOW (ref >60)
GLUCOSE: 133 mg/dL — AB (ref 70–99)
PHOSPHORUS: 6 mg/dL — AB (ref 2.5–4.6)
POTASSIUM: 4.2 mmol/L (ref 3.5–5.1)
SODIUM: 133 mmol/L — AB (ref 135–145)

## 2018-02-12 LAB — GLUCOSE, CAPILLARY
GLUCOSE-CAPILLARY: 109 mg/dL — AB (ref 70–99)
GLUCOSE-CAPILLARY: 154 mg/dL — AB (ref 70–99)
Glucose-Capillary: 79 mg/dL (ref 70–99)

## 2018-02-12 LAB — PROTIME-INR
INR: 1.43
Prothrombin Time: 17.4 seconds — ABNORMAL HIGH (ref 11.4–15.2)

## 2018-02-12 MED ORDER — PENTAFLUOROPROP-TETRAFLUOROETH EX AERO: 1.0000 "application " | INHALATION_SPRAY | CUTANEOUS | Status: DC | PRN

## 2018-02-12 MED ORDER — HEPARIN SODIUM (PORCINE) 1000 UNIT/ML DIALYSIS: 1000.0000 [IU] | INTRAMUSCULAR | Status: DC | PRN

## 2018-02-12 MED ORDER — HEPARIN SODIUM (PORCINE) 1000 UNIT/ML IJ SOLN: 1.2000 mL | Freq: Once | INTRAMUSCULAR | Status: DC

## 2018-02-12 MED ORDER — LIDOCAINE-PRILOCAINE 2.5-2.5 % EX CREA: 1.0000 "application " | TOPICAL_CREAM | CUTANEOUS | Status: DC | PRN

## 2018-02-12 MED ORDER — HEPARIN SODIUM (PORCINE) 1000 UNIT/ML IJ SOLN
1.2000 mL | Freq: Once | INTRAMUSCULAR | Status: AC
  Administered 2018-02-12: 1200 [IU]

## 2018-02-12 MED ORDER — SODIUM CHLORIDE 0.9 % IV SOLN: 100.0000 mL | INTRAVENOUS | Status: DC | PRN

## 2018-02-12 MED ORDER — LIDOCAINE HCL (PF) 1 % IJ SOLN: 5.0000 mL | INTRAMUSCULAR | Status: DC | PRN

## 2018-02-12 MED ORDER — ALTEPLASE 2 MG IJ SOLR: 2.0000 mg | Freq: Once | INTRAMUSCULAR | Status: DC | PRN

## 2018-02-12 MED ORDER — HEPARIN SODIUM (PORCINE) 1000 UNIT/ML DIALYSIS: 20.0000 [IU]/kg | INTRAMUSCULAR | Status: DC | PRN

## 2018-02-12 MED ORDER — WARFARIN - PHARMACIST DOSING INPATIENT
Freq: Every day | Status: DC
  Administered 2018-02-14 – 2018-02-24 (×3)

## 2018-02-12 MED ORDER — HEPARIN SODIUM (PORCINE) 1000 UNIT/ML IJ SOLN
INTRAMUSCULAR | Status: AC
  Filled 2018-02-12: qty 3

## 2018-02-12 MED ORDER — BOOST / RESOURCE BREEZE PO LIQD CUSTOM
1.0000 | Freq: Two times a day (BID) | ORAL | Status: DC
  Administered 2018-02-12: 1 via ORAL
  Filled 2018-02-12 (×5): qty 1

## 2018-02-12 NOTE — Progress Notes (Signed)
[place] for warfarin  Indication: atrial fibrillation  Allergies  Allergen Reactions  . Ace Inhibitors Cough    Patient Measurements: Height: 5\' 10"  (177.8 cm) Weight: 253 lb 8.5 oz (115 kg) IBW/kg (Calculated) : 73   Vital Signs: Temp: 98.3 F (36.8 C) (10/09 0700) Temp Source: Oral (10/09 0700) BP: 137/67 (10/09 1100) Pulse Rate: 81 (10/09 1100)  Labs: Recent Labs    02/10/18 0713 02/10/18 0714 02/10/18 1135 02/11/18 0320 02/12/18 0342  HGB  --  8.7*  --   --   --   HCT  --  27.8*  --   --   --   PLT  --  140*  --   --   --   LABPROT  --   --  28.6* 24.3* 17.4*  INR  --   --  2.72 2.21 1.43  CREATININE 7.33*  --   --  5.04* 5.81*    Estimated Creatinine Clearance: 17 mL/min (A) (by C-G formula based on SCr of 5.81 mg/dL (H)).   Medical History: Past Medical History:  Diagnosis Date  . Bicuspid aortic valve    a. 02/2009 s/p tissue AVR;  b. 07/2014 Echo: EF 55-60%, basal-mid inf HK, mild AS/MR, mod dil LA, PASP 44 mmHg.  Marland Kitchen CAD (coronary artery disease) October 2010   a. 02/2009 NSTEMI/Cath: 3VD; b. 02/2009 CABG x 5: LIMA->LAD, VG->Diag, VG->RI, VG->RVM->RPDA.  Marland Kitchen GERD (gastroesophageal reflux disease)   . Heart murmur   . Hyperlipidemia   . Hypertension   . Myocardial infarction ([place]) 2010  . PVD (peripheral vascular disease) ([place])    a. s/p toe amputations on L foot.  . Type II diabetes mellitus [facility])     Assessment: 62 yo M with new onset PAF on warfarin (started on 10/2).  -INR today = 1.43. Warfarin on hold for [facility] insertion. S/p Vit K x 2. Will f/u after the procedure.   Goal of Therapy:  INR 2-3 Monitor platelets by anticoagulation protocol: Yes   Plan:  -Hold warfarin for procedure -Will f/u for restart after the procedure.  -Daily PT/INR   Thank you for allowing [place] to participate in this patients care.   Jens Som, PharmD Please utilize Amion (under [place]) for appropriate number for  your unit pharmacist. 02/12/2018 11:37 AM

## 2018-02-12 NOTE — Progress Notes (Signed)
PROGRESS NOTE    [name]  WLN:[id number] DOB: 1956/01/04 DOA: 01/27/2018 PCP: [name], MD   Brief Narrative: 62 year old man complicated PMH hospitalized September for sepsis secondary to right foot osteomyelitis, underwent BKA, found to have MSSA bacteremia with splenic infarct and tricuspid valve endocarditis, discharged on Ancef.  Developed acute oliguria and presented to the emergency department where he was found to have acute kidney injury.  Seen by nephrology and infectious disease, antibiotics changed to daptomycin.  Developed acute respiratory failure necessitating transfer to ICU and critical care service.  Temporary dialysis catheter was placed and the patient was started on hemodialysis.  Respiratory status improved and he was transferred back out to the hospital service.   Assessment & Plan:   Principal Problem:   Acute renal failure (ARF) (HCC) Active Problems:   Insulin-requiring or dependent type II diabetes mellitus ([place])   Essential hypertension   CAD (coronary artery disease)   Leukocytosis   Thrombocytopenia (HCC)   S/P BKA (below knee amputation) unilateral, right (HCC)   Endocarditis of tricuspid valve   Hyponatremia   PAF (paroxysmal atrial fibrillation) (HCC)   Aortic atherosclerosis (HCC)   Acute hypoxemic respiratory failure (HCC)   AKI, oliguric; unclear etiology. Could have been from ATN Vs AIN from ancef. Ct negative.  Continue to require HD>  For tunneled catheter placement, when INR acceptable.  Nephrology following.   PAF;  New diagnosis.  Resume coumadin post procedure.  Continue with metorpolol. Follow-up with Dr. Percival Spanish 3-4 weeks after hospitalization.  Acute hypoxic respiratory failure; pulmonary edema. Resolved.   MSSA Bacteremia , tricuspid valve endocarditis; splenic infarct diagnosed 9-17.  On Daptomycin per infectious diseases.   DM type 2; On lantus and SSI.    PVD s/p Bilateral BKA.  Dr Sarajane Jews spoke  with Dr Sharol Given regarding staple removal.    Oral candidiasis --Treated with fluconazole  Thrombocytopenia, stable.  Seen September 2019 as well.    Anemia of critical illness, repeat hb in am.   Obesity --Continue management per dietician  Loss of cortex along the anterior inferior aspect of the T12 vertebral body as well as lucency in the posterior aspect of the L1 vertebral body. Question a degree of discitis/osteomyelitis in this region. This finding may warrant MR pre and post-contrast to further evaluate with respect to discitis/bony destruction. --Case was discussed with Dr. [name].  No change to therapy recommended.  Continue daptomycin.  DVT prophylaxis: coumadin on hold for procedure.  Code Status: full code.  Family Communication: family at bedside.  Disposition Plan: will try LTACH Consultants:   ID  Nephrology    Procedures:   Antimicrobials: Daptomycin.   Subjective: He wants to get Permanent catheter place.  Eating  Small amount. Had BM.   Objective: Vitals:   02/12/18 1030 02/12/18 1100 02/12/18 1125 02/12/18 1214  BP: 135/73 137/67 (!) 152/83 140/87  Pulse: 84 81 92 75  Resp:   16 18  Temp:   (!) 97.1 F (36.2 C) 98.5 F (36.9 C)  TempSrc:   Oral Oral  SpO2:   96%   Weight:   113 kg   Height:        Intake/Output Summary (Last 24 hours) at 02/12/2018 1354 Last data filed at 02/12/2018 1125 Gross per 24 hour  Intake 542.24 ml  Output 3075 ml  Net -2532.76 ml   Filed Weights   02/08/18 0435 02/12/18 0700 02/12/18 1125  Weight: 94 kg 115 kg 113 kg    Examination:  General exam: Appears calm and comfortable  Respiratory system: Crackles at bases.  Cardiovascular system: S1 & S2 heard, RRR. No JVD, murmurs, rubs, gallops or clicks. No pedal edema. Gastrointestinal system:  BS present, soft ,nd Central nervous system: Alert and oriented. No focal neurological deficits. Extremities: bilateral BKA>  Skin: No rashes, lesions or  ulcers    Data Reviewed: I have personally reviewed following labs and imaging studies  CBC: Recent Labs  Lab 02/05/18 1430 02/09/18 0340 02/10/18 0714  WBC 10.2 11.8* 13.0*  HGB 8.8* 8.5* 8.7*  HCT 27.5* 27.1* 27.8*  MCV 90.5 91.2 89.4  PLT 121* 142* 081*   Basic Metabolic Panel: Recent Labs  Lab 02/08/18 0334 02/09/18 0340 02/10/18 0713 02/11/18 0320 02/12/18 0342  NA 133* 132* 129* 133* 133*  K 3.7 3.9 3.7 4.3 4.2  CL 96* 97* 95* 98 98  CO2 26 24 22 24 24   GLUCOSE 150* 117* 68* 148* 133*  BUN 40* 54* 74* 45* 61*  CREATININE 5.17* 6.47* 7.33* 5.04* 5.81*  CALCIUM 7.0* 7.0* 6.5* 6.9* 7.0*  PHOS 4.9* 5.9* 6.3* 5.4* 6.0*   GFR: Estimated Creatinine Clearance: 16.8 mL/min (A) (by C-G formula based on SCr of 5.81 mg/dL (H)). Liver Function Tests: Recent Labs  Lab 02/08/18 0334 02/09/18 0340 02/10/18 0713 02/11/18 0320 02/12/18 0342  ALBUMIN 1.8* 1.9* 2.1* 1.9* 2.0*   No results for input(s): LIPASE, AMYLASE in the last 168 hours. No results for input(s): AMMONIA in the last 168 hours. Coagulation Profile: Recent Labs  Lab 02/08/18 0334 02/09/18 0340 02/10/18 1135 02/11/18 0320 02/12/18 0342  INR 2.76 2.68 2.72 2.21 1.43   Cardiac Enzymes: Recent Labs  Lab 02/06/18 0503 02/08/18 0334  CKTOTAL 239 105   BNP (last 3 results) No results for input(s): PROBNP in the last 8760 hours. HbA1C: No results for input(s): HGBA1C in the last 72 hours. CBG: Recent Labs  Lab 02/11/18 0805 02/11/18 1133 02/11/18 1647 02/11/18 2247 02/12/18 1210  GLUCAP 147* 147* 79 143* 79   Lipid Profile: No results for input(s): CHOL, HDL, LDLCALC, TRIG, CHOLHDL, LDLDIRECT in the last 72 hours. Thyroid Function Tests: No results for input(s): TSH, T4TOTAL, FREET4, T3FREE, THYROIDAB in the last 72 hours. Anemia Panel: No results for input(s): VITAMINB12, FOLATE, FERRITIN, TIBC, IRON, RETICCTPCT in the last 72 hours. Sepsis Labs: No results for input(s): PROCALCITON,  LATICACIDVEN in the last 168 hours.  No results found for this or any previous visit (from the past 240 hour(s)).       Radiology Studies: Dg Chest Port 1 View  Result Date: 02/10/2018 CLINICAL DATA:  Short of breath. EXAM: PORTABLE CHEST 1 VIEW COMPARISON:  02/10/2018 at 5:34 a.m. FINDINGS: Hazy central and lower lung airspace opacities have mildly improved. Interstitial thickening has improved. Small pleural effusions appear decreased since the prior exam. Right internal jugular central venous line and right PICC are stable. There stable changes from cardiac surgery. IMPRESSION: 1. Interval improvement in congestive heart failure and pulmonary edema. There is still mild residual airspace edema and small effusions. No new abnormalities. Electronically Signed   By: Lajean Manes M.D.   On: 02/10/2018 20:59   Dg Abd Portable 1v  Result Date: 02/11/2018 CLINICAL DATA:  RIGHT LOWER QUADRANT abdominal pain and tenderness. EXAM: PORTABLE ABDOMEN - 1 VIEW COMPARISON:  02/03/2018, 01/28/2018. CT abdomen and pelvis 01/29/2018. FINDINGS: Bowel gas pattern unremarkable without evidence of obstruction or significant ileus. No acute abdominal abnormality. Stool burden in the colon. No visible opaque urinary  tract calculi. Degenerative changes involving the visualized thoracic and lumbar spine. IMPRESSION: Negative. Electronically Signed   By: Evangeline Dakin M.D.   On: 02/11/2018 13:40        Scheduled Meds: . Chlorhexidine Gluconate Cloth  6 each Topical Q0600  . feeding supplement (PRO-STAT SUGAR FREE 64)  30 mL Oral Daily  . Gerhardt's butt cream   Topical TID  . heparin      . insulin aspart  0-5 Units Subcutaneous QHS  . insulin aspart  0-9 Units Subcutaneous TID WC  . insulin aspart  4 Units Subcutaneous TID WC  . insulin glargine  22 Units Subcutaneous QHS  . latanoprost  1 drop Both Eyes QHS  . mouth rinse  15 mL Mouth Rinse BID  . metoprolol tartrate  75 mg Oral BID  . nystatin    Topical BID  . pantoprazole  40 mg Oral Q1200  . traZODone  50 mg Oral QHS   Continuous Infusions: . sodium chloride 250 mL (02/11/18 2019)  . DAPTOmycin (CUBICIN)  IV Stopped (02/11/18 2215)     LOS: 16 days    Time spent: 35 minutes.     Elmarie Shiley, MD [facility] Pager [phone]  If 7PM-7AM, please contact night-coverage [email or web address] Password [email or web address] 02/12/2018, 1:54 PM

## 2018-02-12 NOTE — Clinical Social Work Note (Signed)
CSW talked with patient's brother Ayven Pheasant regarding patient's discharge plan. Mr. Coutant informed CSW that he lives near [place] and came to see patient. He expressed concern regarding the facility he came from and the care patient received. Brother aware that LTAC is being considered and had questions about the facility and their ability to provide patient with all of the medical care he will require and this was discusses. Call made to [facility], facility liaison regarding patient and brother's concerns. CSW informed by Raquel Sarna that she was coming to see patient and will talk with brother, and this information conveyed to Mr. Mia. Brother was taken to [facility] for a tour, and CSW advised later by Raquel Sarna that they were beginning pre-cert on patient. CSW will continue to follow through discharge.  Lennon Boutwell Givens, MSW, LCSW Licensed Clinical Social Worker [place] [phone]

## 2018-02-12 NOTE — Procedures (Signed)
I was present at this dialysis session. I have reviewed the session itself and made appropriate changes.   MInimal UOP, SCr climibing in intradialytic window = no evidence of GFR recovery. Possible to [facility].    3L UF goal 2K bath. TDC Qb 400. TOl well. Tentative next HD 10/11.   Filed Weights   02/07/18 1038 02/08/18 0435 02/12/18 0700  Weight: 92.5 kg 94 kg 115 kg    Recent Labs  Lab 02/12/18 0342  NA 133*  K 4.2  CL 98  CO2 24  GLUCOSE 133*  BUN 61*  CREATININE 5.81*  CALCIUM 7.0*  PHOS 6.0*    Recent Labs  Lab 02/05/18 1430 02/09/18 0340 02/10/18 0714  WBC 10.2 11.8* 13.0*  HGB 8.8* 8.5* 8.7*  HCT 27.5* 27.1* 27.8*  MCV 90.5 91.2 89.4  PLT 121* 142* 140*    Scheduled Meds: . Chlorhexidine Gluconate Cloth  6 each Topical Q0600  . feeding supplement (PRO-STAT SUGAR FREE 64)  30 mL Oral Daily  . Gerhardt's butt cream   Topical TID  . insulin aspart  0-5 Units Subcutaneous QHS  . insulin aspart  0-9 Units Subcutaneous TID WC  . insulin aspart  4 Units Subcutaneous TID WC  . insulin glargine  22 Units Subcutaneous QHS  . latanoprost  1 drop Both Eyes QHS  . mouth rinse  15 mL Mouth Rinse BID  . metoprolol tartrate  75 mg Oral BID  . nystatin   Topical BID  . pantoprazole  40 mg Oral Q1200  . traZODone  50 mg Oral QHS   Continuous Infusions: . sodium chloride 250 mL (02/11/18 2019)  . DAPTOmycin (CUBICIN)  IV Stopped (02/11/18 2215)   PRN Meds:.sodium chloride, acetaminophen **OR** acetaminophen, clonazepam, ondansetron **OR** ondansetron (ZOFRAN) IV, sodium chloride flush   Pearson Grippe  MD 02/12/2018, 9:50 AM

## 2018-02-13 ENCOUNTER — Inpatient Hospital Stay (HOSPITAL_COMMUNITY): Payer: Medicare Other

## 2018-02-13 ENCOUNTER — Encounter (HOSPITAL_COMMUNITY): Payer: Self-pay | Admitting: Interventional Radiology

## 2018-02-13 HISTORY — PX: IR FLUORO GUIDE CV LINE RIGHT: IMG2283

## 2018-02-13 HISTORY — PX: IR US GUIDE VASC ACCESS RIGHT: IMG2390

## 2018-02-13 LAB — RENAL FUNCTION PANEL
Albumin: 2 g/dL — ABNORMAL LOW (ref 3.5–5.0)
Anion gap: 10 (ref 5–15)
BUN: 36 mg/dL — AB (ref 8–23)
CHLORIDE: 98 mmol/L (ref 98–111)
CO2: 26 mmol/L (ref 22–32)
CREATININE: 4.22 mg/dL — AB (ref 0.61–1.24)
Calcium: 7.2 mg/dL — ABNORMAL LOW (ref 8.9–10.3)
GFR, EST AFRICAN AMERICAN: 16 mL/min — AB (ref >60)
GFR, EST NON AFRICAN AMERICAN: 14 mL/min — AB (ref >60)
Glucose, Bld: 137 mg/dL — ABNORMAL HIGH (ref 70–99)
POTASSIUM: 3.7 mmol/L (ref 3.5–5.1)
Phosphorus: 5 mg/dL — ABNORMAL HIGH (ref 2.5–4.6)
Sodium: 134 mmol/L — ABNORMAL LOW (ref 135–145)

## 2018-02-13 LAB — GLUCOSE, CAPILLARY
GLUCOSE-CAPILLARY: 115 mg/dL — AB (ref 70–99)
GLUCOSE-CAPILLARY: 117 mg/dL — AB (ref 70–99)
Glucose-Capillary: 103 mg/dL — ABNORMAL HIGH (ref 70–99)
Glucose-Capillary: 118 mg/dL — ABNORMAL HIGH (ref 70–99)

## 2018-02-13 LAB — SEDIMENTATION RATE: SED RATE: 138 mm/h — AB (ref 0–16)

## 2018-02-13 LAB — CBC
HCT: 26.3 % — ABNORMAL LOW (ref 39.0–52.0)
HEMOGLOBIN: 8.1 g/dL — AB (ref 13.0–17.0)
MCH: 27.8 pg (ref 26.0–34.0)
MCHC: 30.8 g/dL (ref 30.0–36.0)
MCV: 90.4 fL (ref 80.0–100.0)
NRBC: 0 % (ref 0.0–0.2)
Platelets: 174 10*3/uL (ref 150–400)
RBC: 2.91 MIL/uL — AB (ref 4.22–5.81)
RDW: 13.4 % (ref 11.5–15.5)
WBC: 8.8 10*3/uL (ref 4.0–10.5)

## 2018-02-13 LAB — CK: Total CK: 64 U/L (ref 49–397)

## 2018-02-13 LAB — PROTIME-INR
INR: 1.37
Prothrombin Time: 16.8 seconds — ABNORMAL HIGH (ref 11.4–15.2)

## 2018-02-13 MED ORDER — MIDAZOLAM HCL 2 MG/2ML IJ SOLN
INTRAMUSCULAR | Status: AC
  Filled 2018-02-13: qty 4

## 2018-02-13 MED ORDER — LIDOCAINE HCL 1 % IJ SOLN
INTRAMUSCULAR | Status: AC
  Filled 2018-02-13: qty 20

## 2018-02-13 MED ORDER — FENTANYL CITRATE (PF) 100 MCG/2ML IJ SOLN
INTRAMUSCULAR | Status: AC
  Filled 2018-02-13: qty 4

## 2018-02-13 MED ORDER — HEPARIN SODIUM (PORCINE) 1000 UNIT/ML IJ SOLN
INTRAMUSCULAR | Status: AC
  Administered 2018-02-13: 1000 [IU]
  Filled 2018-02-13: qty 1

## 2018-02-13 MED ORDER — WARFARIN SODIUM 5 MG PO TABS
5.0000 mg | ORAL_TABLET | Freq: Once | ORAL | Status: AC
  Administered 2018-02-13: 5 mg via ORAL
  Filled 2018-02-13: qty 1

## 2018-02-13 MED ORDER — LIDOCAINE HCL (PF) 1 % IJ SOLN
INTRAMUSCULAR | Status: AC | PRN
  Administered 2018-02-13: 5 mL

## 2018-02-13 MED ORDER — FENTANYL CITRATE (PF) 100 MCG/2ML IJ SOLN
INTRAMUSCULAR | Status: AC | PRN
  Administered 2018-02-13: 25 ug via INTRAVENOUS
  Administered 2018-02-13: 50 ug via INTRAVENOUS

## 2018-02-13 MED ORDER — CEFAZOLIN SODIUM-DEXTROSE 2-4 GM/100ML-% IV SOLN
INTRAVENOUS | Status: AC
  Administered 2018-02-13: 2000 mg
  Filled 2018-02-13: qty 100

## 2018-02-13 MED ORDER — CEFAZOLIN (ANCEF) 1 G IV SOLR
2.0000 g | INTRAVENOUS | Status: AC
  Filled 2018-02-13: qty 2

## 2018-02-13 MED ORDER — MIDAZOLAM HCL 2 MG/2ML IJ SOLN
INTRAMUSCULAR | Status: AC | PRN
  Administered 2018-02-13: 0.5 mg via INTRAVENOUS
  Administered 2018-02-13: 1 mg via INTRAVENOUS

## 2018-02-13 MED ORDER — GELATIN ABSORBABLE 12-7 MM EX MISC
CUTANEOUS | Status: AC
  Filled 2018-02-13: qty 1

## 2018-02-13 NOTE — Progress Notes (Signed)
PROGRESS NOTE    [name]  MHD:[id number] DOB: [birth year] DOA: 01/27/2018 PCP: [name], MD   Brief Narrative: 62 year old man complicated PMH hospitalized September for sepsis secondary to right foot osteomyelitis, underwent BKA, found to have MSSA bacteremia with splenic infarct and tricuspid valve endocarditis, discharged on Ancef.  Developed acute oliguria and presented to the emergency department where he was found to have acute kidney injury.  Seen by nephrology and infectious disease, antibiotics changed to daptomycin.  Developed acute respiratory failure necessitating transfer to ICU and critical care service.  Temporary dialysis catheter was placed and the patient was started on hemodialysis.  Respiratory status improved and he was transferred back out to the hospital service.   Assessment & Plan:   Principal Problem:   Acute renal failure (ARF) (HCC) Active Problems:   Insulin-requiring or dependent type II diabetes mellitus ([place])   Essential hypertension   CAD (coronary artery disease)   Leukocytosis   Thrombocytopenia (HCC)   S/P BKA (below knee amputation) unilateral, right (HCC)   Endocarditis of tricuspid valve   Hyponatremia   PAF (paroxysmal atrial fibrillation) (HCC)   Aortic atherosclerosis (HCC)   Acute hypoxemic respiratory failure (HCC)   AKI, oliguric; unclear etiology. Could have been from ATN Vs AIN from ancef. Ct negative.  Continue to require HD>  For tunneled catheter placement, today.  Nephrology following.  Tentative HD tomorrow.   PAF;  New diagnosis.  Resume coumadin post procedure. Pharmacy following.  Continue with metorpolol. Follow-up with Dr. Percival Spanish 3-4 weeks after hospitalization.  Acute hypoxic respiratory failure; pulmonary edema. Resolved.   MSSA Bacteremia , tricuspid valve endocarditis; splenic infarct diagnosed 9-17.  On Daptomycin per infectious diseases.  ESR elevated.   DM type 2; On lantus and SSI.     PVD s/p Bilateral BKA.  Dr Sarajane Jews spoke with Dr Sharol Given regarding staple removal.   Oral candidiasis Treated with fluconazole  Thrombocytopenia, stable.  Seen September 2019 as well.    Anemia of critical illness,  Monitor hb.   Obesity Continue management per dietician  Loss of cortex along the anterior inferior aspect of the T12 vertebral body as well as lucency in the posterior aspect of the L1 vertebral body. Question a degree of discitis/osteomyelitis in this region. This finding may warrant MR pre and post-contrast to further evaluate with respect to discitis/bony destruction. --Case was discussed with Dr. [name].  No change to therapy recommended.  Continue daptomycin.  DVT prophylaxis: coumadin on hold for procedure.  Code Status: full code.  Family Communication: family at bedside.  Disposition Plan: will try LTACH Consultants:   ID  Nephrology    Procedures:   Antimicrobials: Daptomycin.   Subjective: He denies chest pain.   Objective: Vitals:   02/13/18 1435 02/13/18 1440 02/13/18 1445 02/13/18 1450  BP: (!) 153/75 (!) 159/73 (!) 152/76 132/70  Pulse: 79 78 79 80  Resp: '16 16 19 ' (!) 24  Temp:      TempSrc:      SpO2: 95% 97% 96% 95%  Weight:      Height:        Intake/Output Summary (Last 24 hours) at 02/13/2018 1630 Last data filed at 02/13/2018 1546 Gross per 24 hour  Intake 320 ml  Output 501 ml  Net -181 ml   Filed Weights   02/12/18 0700 02/12/18 1125 02/12/18 2034  Weight: 115 kg 113 kg 113 kg    Examination:  General exam: NAD Respiratory system; decreased breath sound, no crackles.  Cardiovascular system: S 1, S 2 RRR Gastrointestinal system:  BS present, soft, nt Central nervous system: alert.  Extremities: bilateral BKA>  Skin: no rashes.     Data Reviewed: I have personally reviewed following labs and imaging studies  CBC: Recent Labs  Lab 02/09/18 0340 02/10/18 0714 02/13/18 0400  WBC 11.8* 13.0* 8.8  HGB  8.5* 8.7* 8.1*  HCT 27.1* 27.8* 26.3*  MCV 91.2 89.4 90.4  PLT 142* 140* 381   Basic Metabolic Panel: Recent Labs  Lab 02/09/18 0340 02/10/18 0713 02/11/18 0320 02/12/18 0342 02/13/18 0400  NA 132* 129* 133* 133* 134*  K 3.9 3.7 4.3 4.2 3.7  CL 97* 95* 98 98 98  CO2 '24 22 24 24 26  ' GLUCOSE 117* 68* 148* 133* 137*  BUN 54* 74* 45* 61* 36*  CREATININE 6.47* 7.33* 5.04* 5.81* 4.22*  CALCIUM 7.0* 6.5* 6.9* 7.0* 7.2*  PHOS 5.9* 6.3* 5.4* 6.0* 5.0*   GFR: Estimated Creatinine Clearance: 23.1 mL/min (A) (by C-G formula based on SCr of 4.22 mg/dL (H)). Liver Function Tests: Recent Labs  Lab 02/09/18 0340 02/10/18 0713 02/11/18 0320 02/12/18 0342 02/13/18 0400  ALBUMIN 1.9* 2.1* 1.9* 2.0* 2.0*   No results for input(s): LIPASE, AMYLASE in the last 168 hours. No results for input(s): AMMONIA in the last 168 hours. Coagulation Profile: Recent Labs  Lab 02/09/18 0340 02/10/18 1135 02/11/18 0320 02/12/18 0342 02/13/18 0400  INR 2.68 2.72 2.21 1.43 1.37   Cardiac Enzymes: Recent Labs  Lab 02/08/18 0334 02/13/18 0400  CKTOTAL 105 64   BNP (last 3 results) No results for input(s): PROBNP in the last 8760 hours. HbA1C: No results for input(s): HGBA1C in the last 72 hours. CBG: Recent Labs  Lab 02/12/18 1210 02/12/18 1719 02/12/18 2035 02/13/18 0732 02/13/18 1131  GLUCAP 79 109* 154* 115* 117*   Lipid Profile: No results for input(s): CHOL, HDL, LDLCALC, TRIG, CHOLHDL, LDLDIRECT in the last 72 hours. Thyroid Function Tests: No results for input(s): TSH, T4TOTAL, FREET4, T3FREE, THYROIDAB in the last 72 hours. Anemia Panel: No results for input(s): VITAMINB12, FOLATE, FERRITIN, TIBC, IRON, RETICCTPCT in the last 72 hours. Sepsis Labs: No results for input(s): PROCALCITON, LATICACIDVEN in the last 168 hours.  No results found for this or any previous visit (from the past 240 hour(s)).       Radiology Studies: Ir Fluoro Guide Cv Line Right  Result  Date: 02/13/2018 INDICATION: 62 year old male with end-stage renal disease on hemodialysis. He currently has a right IJ approach non tunneled hemodialysis catheter and requires a tunneled hemodialysis catheter. EXAM: TUNNELED CENTRAL VENOUS HEMODIALYSIS CATHETER PLACEMENT WITH ULTRASOUND AND FLUOROSCOPIC GUIDANCE MEDICATIONS: 2 g Ancef. The antibiotic was given in an appropriate time interval prior to skin puncture. ANESTHESIA/SEDATION: Moderate (conscious) sedation was employed during this procedure. A total of Versed 1.5 mg and Fentanyl 75 mcg was administered intravenously. Moderate Sedation Time: 17 minutes. The patient's level of consciousness and vital signs were monitored continuously by radiology nursing throughout the procedure under my direct supervision. FLUOROSCOPY TIME:  Fluoroscopy Time: 0 minutes 30 seconds (6 mGy). COMPLICATIONS: None immediate. PROCEDURE: Informed written consent was obtained from the patient after a discussion of the risks, benefits, and alternatives to treatment. Questions regarding the procedure were encouraged and answered. The right neck and chest were prepped with chlorhexidine in a sterile fashion, and a sterile drape was applied covering the operative field. Maximum barrier sterile technique with sterile gowns and gloves were used for the procedure. A timeout was  performed prior to the initiation of the procedure. After creating a small venotomy incision, a micropuncture kit was utilized to access the right internal jugular vein under direct, real-time ultrasound guidance after the overlying soft tissues were anesthetized with 1% lidocaine with epinephrine. Ultrasound image documentation was performed. The microwire was kinked to measure appropriate catheter length. A stiff Glidewire was advanced to the level of the IVC and the micropuncture sheath was exchanged for a peel-away sheath. A Palindrome tunneled hemodialysis catheter measuring 19 cm from tip to cuff was tunneled  in a retrograde fashion from the anterior chest wall to the venotomy incision. The catheter was then placed through the peel-away sheath with tips ultimately positioned within the superior aspect of the right atrium. Final catheter positioning was confirmed and documented with a spot radiographic image. The catheter aspirates and flushes normally. The catheter was flushed with appropriate volume heparin dwells. The catheter exit site was secured with a 0-Prolene retention suture. The venotomy incision was closed with an interrupted 4-0 Vicryl, Dermabond and Steri-strips. Dressings were applied. The patient tolerated the procedure well without immediate post procedural complication. IMPRESSION: Successful placement of 19 cm tip to cuff tunneled hemodialysis catheter via the right internal jugular vein with tips terminating within the superior aspect of the right atrium. The catheter is ready for immediate use. Electronically Signed   By: Jacqulynn Cadet M.D.   On: 02/13/2018 16:17   Ir US Guide Vasc Access Right  Result Date: 02/13/2018 INDICATION: 63 year old male with end-stage renal disease on hemodialysis. He currently has a right IJ approach non tunneled hemodialysis catheter and requires a tunneled hemodialysis catheter. EXAM: TUNNELED CENTRAL VENOUS HEMODIALYSIS CATHETER PLACEMENT WITH ULTRASOUND AND FLUOROSCOPIC GUIDANCE MEDICATIONS: 2 g Ancef. The antibiotic was given in an appropriate time interval prior to skin puncture. ANESTHESIA/SEDATION: Moderate (conscious) sedation was employed during this procedure. A total of Versed 1.5 mg and Fentanyl 75 mcg was administered intravenously. Moderate Sedation Time: 17 minutes. The patient's level of consciousness and vital signs were monitored continuously by radiology nursing throughout the procedure under my direct supervision. FLUOROSCOPY TIME:  Fluoroscopy Time: 0 minutes 30 seconds (6 mGy). COMPLICATIONS: None immediate. PROCEDURE: Informed written  consent was obtained from the patient after a discussion of the risks, benefits, and alternatives to treatment. Questions regarding the procedure were encouraged and answered. The right neck and chest were prepped with chlorhexidine in a sterile fashion, and a sterile drape was applied covering the operative field. Maximum barrier sterile technique with sterile gowns and gloves were used for the procedure. A timeout was performed prior to the initiation of the procedure. After creating a small venotomy incision, a micropuncture kit was utilized to access the right internal jugular vein under direct, real-time ultrasound guidance after the overlying soft tissues were anesthetized with 1% lidocaine with epinephrine. Ultrasound image documentation was performed. The microwire was kinked to measure appropriate catheter length. A stiff Glidewire was advanced to the level of the IVC and the micropuncture sheath was exchanged for a peel-away sheath. A Palindrome tunneled hemodialysis catheter measuring 19 cm from tip to cuff was tunneled in a retrograde fashion from the anterior chest wall to the venotomy incision. The catheter was then placed through the peel-away sheath with tips ultimately positioned within the superior aspect of the right atrium. Final catheter positioning was confirmed and documented with a spot radiographic image. The catheter aspirates and flushes normally. The catheter was flushed with appropriate volume heparin dwells. The catheter exit site was secured with  a 0-Prolene retention suture. The venotomy incision was closed with an interrupted 4-0 Vicryl, Dermabond and Steri-strips. Dressings were applied. The patient tolerated the procedure well without immediate post procedural complication. IMPRESSION: Successful placement of 19 cm tip to cuff tunneled hemodialysis catheter via the right internal jugular vein with tips terminating within the superior aspect of the right atrium. The catheter is  ready for immediate use. Electronically Signed   By: Jacqulynn Cadet M.D.   On: 02/13/2018 16:17        Scheduled Meds: . ceFAZolin  2 g Other To OR  . Chlorhexidine Gluconate Cloth  6 each Topical Q0600  . feeding supplement  1 Container Oral BID BM  . feeding supplement (PRO-STAT SUGAR FREE 64)  30 mL Oral Daily  . Gerhardt's butt cream   Topical TID  . insulin aspart  0-5 Units Subcutaneous QHS  . insulin aspart  0-9 Units Subcutaneous TID WC  . insulin aspart  4 Units Subcutaneous TID WC  . insulin glargine  22 Units Subcutaneous QHS  . latanoprost  1 drop Both Eyes QHS  . lidocaine      . mouth rinse  15 mL Mouth Rinse BID  . metoprolol tartrate  75 mg Oral BID  . nystatin   Topical BID  . pantoprazole  40 mg Oral Q1200  . traZODone  50 mg Oral QHS  . Warfarin - Pharmacist Dosing Inpatient   Does not apply q1800   Continuous Infusions: . sodium chloride 250 mL (02/11/18 2019)  . DAPTOmycin (CUBICIN)  IV Stopped (02/11/18 2215)     LOS: 17 days    Time spent: 35 minutes.     Elmarie Shiley, MD [facility] Pager [phone]  If 7PM-7AM, please contact night-coverage [email or web address] Password [email or web address] 02/13/2018, 4:30 PM

## 2018-02-13 NOTE — Procedures (Signed)
Interventional Radiology Procedure Note  Procedure: Right IJ 19 cm tunneled Palindrome HD catheter.  Tip in the RA and ready for use.   Complications: None  Estimated Blood Loss: None  Recommendations: - Routine line care   Signed,  Criselda Peaches, MD

## 2018-02-13 NOTE — Progress Notes (Signed)
[facility] ROUNDING NOTE   Subjective:    HD yesterday, tolerated well.  3 L ultrafiltration  Needs tunneling of TDC still  Afebrile, blood pressure stable  Objective:  Vital signs in last 24 hours:  Temp:  [98 F (36.7 C)-98.9 F (37.2 C)] 98 F (36.7 C) (10/10 0732) Pulse Rate:  [82-87] 87 (10/10 0732) Resp:  [18-20] 18 (10/10 0732) BP: (119-144)/(63-73) 119/63 (10/10 0732) SpO2:  [88 %-94 %] 93 % (10/10 0732) Weight:  [163 kg] 113 kg (10/09 2034)  Weight change: -2 kg Filed Weights   02/12/18 0700 02/12/18 1125 02/12/18 2034  Weight: 115 kg 113 kg 113 kg    Intake/Output: I/O last 3 completed shifts: In: 922.2 [P.O.:660; I.V.:34.2; IV Piggyback:228] Out: 8466 [Urine:250; Other:3000; Stool:1]   Intake/Output this shift:  Total I/O In: 0  Out: 150 [ZLDJT:701]  CVS- RRR systolic murmur RS-  Normal WOB and clear anteriorly ABD-soft, mod distented abdomen, +BS EXT-generalized edema trace  Access - RIJ trialysis   Basic Metabolic Panel: Recent Labs  Lab 02/09/18 0340 02/10/18 0713 02/11/18 0320 02/12/18 0342 02/13/18 0400  NA 132* 129* 133* 133* 134*  K 3.9 3.7 4.3 4.2 3.7  CL 97* 95* 98 98 98  CO2 24 22 24 24 26   GLUCOSE 117* 68* 148* 133* 137*  BUN 54* 74* 45* 61* 36*  CREATININE 6.47* 7.33* 5.04* 5.81* 4.22*  CALCIUM 7.0* 6.5* 6.9* 7.0* 7.2*  PHOS 5.9* 6.3* 5.4* 6.0* 5.0*    Liver Function Tests: Recent Labs  Lab 02/09/18 0340 02/10/18 0713 02/11/18 0320 02/12/18 0342 02/13/18 0400  ALBUMIN 1.9* 2.1* 1.9* 2.0* 2.0*   No results for input(s): LIPASE, AMYLASE in the last 168 hours. No results for input(s): AMMONIA in the last 168 hours.  CBC: Recent Labs  Lab 02/09/18 0340 02/10/18 0714 02/13/18 0400  WBC 11.8* 13.0* 8.8  HGB 8.5* 8.7* 8.1*  HCT 27.1* 27.8* 26.3*  MCV 91.2 89.4 90.4  PLT 142* 140* 174    Cardiac Enzymes: Recent Labs  Lab 02/08/18 0334 02/13/18 0400  CKTOTAL 105 64    BNP: Invalid  input(s): POCBNP  CBG: Recent Labs  Lab 02/12/18 1210 02/12/18 1719 02/12/18 2035 02/13/18 0732 02/13/18 1131  GLUCAP 79 109* 154* 115* 117*    Microbiology: Results for orders placed or performed during the hospital encounter of 01/27/18  Urine culture     Status: None   Collection Time: 01/28/18  4:29 PM  Result Value Ref Range Status   Specimen Description URINE, CATHETERIZED  Final   Special Requests NONE  Final   Culture   Final    NO GROWTH Performed at [facility], [place] [address] [place], [place] [address]    Report Status 01/29/2018 FINAL  Final    Coagulation Studies: Recent Labs    02/11/18 0320 02/12/18 0342 02/13/18 0400  LABPROT 24.3* 17.4* 16.8*  INR 2.21 1.43 1.37    Urinalysis: No results for input(s): COLORURINE, LABSPEC, PHURINE, GLUCOSEU, HGBUR, BILIRUBINUR, KETONESUR, PROTEINUR, UROBILINOGEN, NITRITE, LEUKOCYTESUR in the last 72 hours.  Invalid input(s): APPERANCEUR    Imaging: No results found.   Medications:   . sodium chloride 250 mL (02/11/18 2019)  . DAPTOmycin (CUBICIN)  IV Stopped (02/11/18 2215)   . Chlorhexidine Gluconate Cloth  6 each Topical Q0600  . feeding supplement  1 Container Oral BID BM  . feeding supplement (PRO-STAT SUGAR FREE 64)  30 mL Oral Daily  . Gerhardt's butt cream   Topical  TID  . insulin aspart  0-5 Units Subcutaneous QHS  . insulin aspart  0-9 Units Subcutaneous TID WC  . insulin aspart  4 Units Subcutaneous TID WC  . insulin glargine  22 Units Subcutaneous QHS  . latanoprost  1 drop Both Eyes QHS  . mouth rinse  15 mL Mouth Rinse BID  . metoprolol tartrate  75 mg Oral BID  . nystatin   Topical BID  . pantoprazole  40 mg Oral Q1200  . traZODone  50 mg Oral QHS  . Warfarin - Pharmacist Dosing Inpatient   Does not apply q1800   sodium chloride, acetaminophen **OR** acetaminophen, clonazepam, ondansetron **OR** ondansetron (ZOFRAN) IV, sodium chloride flush  Assessment/ Plan:    1.  AKI,  severe, oliguric:  Cr 01/22/18 1.13 but 9/23 9.6.  He has no clear nephrotoxic events (did have low PO intake days prior to presentation) but working diagnosis is ATN versus AIN from ancef which has been discontinued.  Empiric hydration was not helpful. CT renal stone protocol unrevealing.  He initiated RRT for volume overload.  Doubt acute glomerular process.  Continue to provide supportive care and await renal recovery which is still possible.  Tentative for dialysis tomorrow 3K, Hold heparin, 3L UF, 4h.  IR following for placement of tunneled dialysis catheter.   2. Atrial fibrillation with RVR:  Currently in NSR with normal BP.  On metoprolol 75 BID. Coumadin on hold for procedure.  Cardiology following.   3.  Anemia: Hemoglobin stable in the eights, might require ESA.  Avoid iron given bacteremia, transfuse PRN  4. 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 Algis Downs input for infectious disease started 01/30/2018 daptomycin 700 mg every 48 hours  5.  Diabetes mellitus per primary team     LOS: 17 Rhonna Holster B @TODAY @12 :15 PM

## 2018-02-13 NOTE — Progress Notes (Signed)
[place] for warfarin  Indication: atrial fibrillation  Allergies  Allergen Reactions  . Ace Inhibitors Cough    Patient Measurements: Height: 5\' 10"  (177.8 cm) Weight: 249 lb 1.9 oz (113 kg) IBW/kg (Calculated) : 73   Vital Signs: Temp: 98 F (36.7 C) (10/10 0732) Temp Source: Oral (10/10 0732) BP: 132/70 (10/10 1450) Pulse Rate: 80 (10/10 1450)  Labs: Recent Labs    02/11/18 0320 02/12/18 0342 02/13/18 0400  HGB  --   --  8.1*  HCT  --   --  26.3*  PLT  --   --  174  LABPROT 24.3* 17.4* 16.8*  INR 2.21 1.43 1.37  CREATININE 5.04* 5.81* 4.22*  CKTOTAL  --   --  64    Estimated Creatinine Clearance: 23.1 mL/min (A) (by C-G formula based on SCr of 4.22 mg/dL (H)).   Medical History: Past Medical History:  Diagnosis Date  . Bicuspid aortic valve    a. 02/2009 s/p tissue AVR;  b. 07/2014 Echo: EF 55-60%, basal-mid inf HK, mild AS/MR, mod dil LA, PASP 44 mmHg.  Marland Kitchen CAD (coronary artery disease) October 2010   a. 02/2009 NSTEMI/Cath: 3VD; b. 02/2009 CABG x 5: LIMA->LAD, VG->Diag, VG->RI, VG->RVM->RPDA.  Marland Kitchen GERD (gastroesophageal reflux disease)   . Heart murmur   . Hyperlipidemia   . Hypertension   . Myocardial infarction ([place]) 2010  . PVD (peripheral vascular disease) ([place])    a. s/p toe amputations on L foot.  . Type II diabetes mellitus [facility])     Assessment: 62 yo M with new onset PAF on warfarin (started on 10/2).  -INR today = 1.37. Coumadin has been on hold for his HD cath. It has been placed. Dr.  Laurence Ferrari has given the ok to resume coumadin. He got 2 doses of vit k on 10/7 and 10/8.   Goal of Therapy:  INR 2-3 Monitor platelets by anticoagulation protocol: Yes   Plan:  Coumadin 5mg  PO x1 Daily PT/INR  Onnie Boer, PharmD, BCIDP, AAHIVP, CPP Infectious Disease Pharmacist Pager: [phone] 02/13/2018 4:48 PM

## 2018-02-13 NOTE — Care Management Note (Signed)
Case Management Note  Patient Details  Name: [name] MRN: [id number] Date of Birth: [birth year]  Subjective/Objective:                    Action/Plan:  Spoke w Lenord Fellers, hospital liaison [facility]. Patient's case is being evaluated by [facility], awaiting insurance authorization.  Expected Discharge Date:  02/03/18               Expected Discharge Plan:  Long Term Acute Care (LTAC)  In-House Referral:  Clinical Social Work  Discharge planning Services  CM Consult  Post Acute Care Choice:    Choice offered to:     DME Arranged:    DME Agency:     HH Arranged:    [place] Agency:     Status of Service:  In process, will continue to follow  If discussed at Long Length of Stay Meetings, dates discussed:    Additional Comments:  Carles Collet, RN 02/13/2018, 10:40 AM

## 2018-02-14 LAB — CBC
HCT: 26.3 % — ABNORMAL LOW (ref 39.0–52.0)
Hemoglobin: 8.2 g/dL — ABNORMAL LOW (ref 13.0–17.0)
MCH: 28.2 pg (ref 26.0–34.0)
MCHC: 31.2 g/dL (ref 30.0–36.0)
MCV: 90.4 fL (ref 80.0–100.0)
NRBC: 0 % (ref 0.0–0.2)
PLATELETS: 204 10*3/uL (ref 150–400)
RBC: 2.91 MIL/uL — AB (ref 4.22–5.81)
RDW: 13.2 % (ref 11.5–15.5)
WBC: 9.8 10*3/uL (ref 4.0–10.5)

## 2018-02-14 LAB — GLUCOSE, CAPILLARY
GLUCOSE-CAPILLARY: 121 mg/dL — AB (ref 70–99)
GLUCOSE-CAPILLARY: 103 mg/dL — AB (ref 70–99)
GLUCOSE-CAPILLARY: 147 mg/dL — AB (ref 70–99)
GLUCOSE-CAPILLARY: 144 mg/dL — AB (ref 70–99)

## 2018-02-14 LAB — RENAL FUNCTION PANEL
ALBUMIN: 2 g/dL — AB (ref 3.5–5.0)
Anion gap: 11 (ref 5–15)
BUN: 55 mg/dL — AB (ref 8–23)
CHLORIDE: 97 mmol/L — AB (ref 98–111)
CO2: 26 mmol/L (ref 22–32)
Calcium: 7 mg/dL — ABNORMAL LOW (ref 8.9–10.3)
Creatinine, Ser: 5.19 mg/dL — ABNORMAL HIGH (ref 0.61–1.24)
GFR calc Af Amer: 13 mL/min — ABNORMAL LOW (ref >60)
GFR, EST NON AFRICAN AMERICAN: 11 mL/min — AB (ref >60)
GLUCOSE: 107 mg/dL — AB (ref 70–99)
PHOSPHORUS: 5.6 mg/dL — AB (ref 2.5–4.6)
Potassium: 3.7 mmol/L (ref 3.5–5.1)
Sodium: 134 mmol/L — ABNORMAL LOW (ref 135–145)

## 2018-02-14 LAB — PROTIME-INR
INR: 1.39
PROTHROMBIN TIME: 17 s — AB (ref 11.4–15.2)

## 2018-02-14 MED ORDER — DARBEPOETIN ALFA 60 MCG/0.3ML IJ SOSY
PREFILLED_SYRINGE | INTRAMUSCULAR | Status: AC
  Administered 2018-02-14: 60 ug via INTRAVENOUS
  Filled 2018-02-14: qty 0.3

## 2018-02-14 MED ORDER — WARFARIN SODIUM 5 MG PO TABS
5.0000 mg | ORAL_TABLET | Freq: Once | ORAL | Status: AC
  Administered 2018-02-14: 5 mg via ORAL
  Filled 2018-02-14: qty 1

## 2018-02-14 MED ORDER — RENA-VITE PO TABS
1.0000 | ORAL_TABLET | Freq: Every day | ORAL | Status: DC
  Administered 2018-02-14 – 2018-03-10 (×24): 1 via ORAL
  Filled 2018-02-14 (×24): qty 1

## 2018-02-14 MED ORDER — HEPARIN SODIUM (PORCINE) 1000 UNIT/ML IJ SOLN
INTRAMUSCULAR | Status: AC
  Administered 2018-02-14: 4000 [IU] via ARTERIOVENOUS_FISTULA
  Filled 2018-02-14: qty 4

## 2018-02-14 MED ORDER — NEPRO/CARBSTEADY PO LIQD
237.0000 mL | Freq: Two times a day (BID) | ORAL | Status: DC
  Administered 2018-02-15 – 2018-02-18 (×2): 237 mL via ORAL
  Filled 2018-02-14 (×10): qty 237

## 2018-02-14 MED ORDER — DARBEPOETIN ALFA 60 MCG/0.3ML IJ SOSY
60.0000 ug | PREFILLED_SYRINGE | INTRAMUSCULAR | Status: DC
  Administered 2018-02-14: 60 ug via INTRAVENOUS
  Filled 2018-02-14: qty 0.3

## 2018-02-14 NOTE — Progress Notes (Addendum)
PROGRESS NOTE    [name]  SEG:[id number] DOB: [birth year] DOA: 01/27/2018 PCP: [name], MD   Brief Narrative: 62 year old man complicated PMH hospitalized September for sepsis secondary to right foot osteomyelitis, underwent BKA, found to have MSSA bacteremia with splenic infarct and tricuspid valve endocarditis, discharged on Ancef.  Developed acute oliguria and presented to the emergency department where he was found to have acute kidney injury.  Seen by nephrology and infectious disease, antibiotics changed to daptomycin.  Developed acute respiratory failure necessitating transfer to ICU and critical care service.  Temporary dialysis catheter was placed and the patient was started on hemodialysis.  Respiratory status improved and he was transferred back out to the hospital service.   Assessment & Plan:   Principal Problem:   Acute renal failure (ARF) (HCC) Active Problems:   Insulin-requiring or dependent type II diabetes mellitus ([place])   Essential hypertension   CAD (coronary artery disease)   Leukocytosis   Thrombocytopenia (HCC)   S/P BKA (below knee amputation) unilateral, right (HCC)   Endocarditis of tricuspid valve   Hyponatremia   PAF (paroxysmal atrial fibrillation) (HCC)   Aortic atherosclerosis (HCC)   Acute hypoxemic respiratory failure (HCC)   AKI, oliguric; unclear etiology. Could have been from ATN Vs AIN from ancef. Ct negative.  Continue to require HD>  Had tunneled catheter placement, 10-10 Nephrology following.  Had HD today.   PAF;  New diagnosis.  Resume coumadin. INR 1.3 Continue with metorpolol. Follow-up with Dr. Percival Spanish 3-4 weeks after hospitalization.  Acute hypoxic respiratory failure; pulmonary edema. Resolved.   MSSA Bacteremia , tricuspid valve endocarditis; splenic infarct diagnosed 9-17.  On Daptomycin per infectious diseases.  ESR elevated. Needs follow up   DM type 2; On lantus and SSI.    PVD s/p Bilateral  BKA.  Dr Sarajane Jews spoke with Dr Sharol Given regarding staple removal.   Oral candidiasis Treated with fluconazole  Thrombocytopenia, stable.  Seen September 2019 as well.    Anemia of critical illness,  Monitor hb.   Obesity Continue management per dietician  Loss of cortex along the anterior inferior aspect of the T12 vertebral body as well as lucency in the posterior aspect of the L1 vertebral body. Question a degree of discitis/osteomyelitis in this region. This finding may warrant MR pre and post-contrast to further evaluate with respect to discitis/bony destruction. --Case was discussed with Dr. [name].  No change to therapy recommended.  Continue daptomycin.  Diarrhea; check stool culture.   DVT prophylaxis: coumadin on hold for procedure.  Code Status: full code.  Family Communication: family at bedside.  Disposition Plan: awaiting approval for  LTACH Consultants:   ID  Nephrology    Procedures:   Antimicrobials: Daptomycin.   Subjective: Report diarrhea after eating.   Objective: Vitals:   02/14/18 1200 02/14/18 1230 02/14/18 1300 02/14/18 1330  BP: (!) 124/48 116/68 124/73 127/75  Pulse: 89 90 90 91  Resp:      Temp:      TempSrc:      SpO2:      Weight:      Height:        Intake/Output Summary (Last 24 hours) at 02/14/2018 1412 Last data filed at 02/14/2018 0835 Gross per 24 hour  Intake 791.16 ml  Output 226 ml  Net 565.16 ml   Filed Weights   02/12/18 1125 02/12/18 2034 02/14/18 0950  Weight: 113 kg 113 kg 114 kg    Examination:  General exam: NAD Respiratory system; no  crackles.  Cardiovascular system: S 1, S 2 RRR Gastrointestinal system: BS present, soft  Central nervous system; alert./  Extremities: Bilateral AKA>  Skin: no rashes.     Data Reviewed: I have personally reviewed following labs and imaging studies  CBC: Recent Labs  Lab 02/09/18 0340 02/10/18 0714 02/13/18 0400 02/14/18 0406  WBC 11.8* 13.0* 8.8 9.8  HGB  8.5* 8.7* 8.1* 8.2*  HCT 27.1* 27.8* 26.3* 26.3*  MCV 91.2 89.4 90.4 90.4  PLT 142* 140* 174 381   Basic Metabolic Panel: Recent Labs  Lab 02/10/18 0713 02/11/18 0320 02/12/18 0342 02/13/18 0400 02/14/18 0406  NA 129* 133* 133* 134* 134*  K 3.7 4.3 4.2 3.7 3.7  CL 95* 98 98 98 97*  CO2 '22 24 24 26 26  ' GLUCOSE 68* 148* 133* 137* 107*  BUN 74* 45* 61* 36* 55*  CREATININE 7.33* 5.04* 5.81* 4.22* 5.19*  CALCIUM 6.5* 6.9* 7.0* 7.2* 7.0*  PHOS 6.3* 5.4* 6.0* 5.0* 5.6*   GFR: Estimated Creatinine Clearance: 18.9 mL/min (A) (by C-G formula based on SCr of 5.19 mg/dL (H)). Liver Function Tests: Recent Labs  Lab 02/10/18 0713 02/11/18 0320 02/12/18 0342 02/13/18 0400 02/14/18 0406  ALBUMIN 2.1* 1.9* 2.0* 2.0* 2.0*   No results for input(s): LIPASE, AMYLASE in the last 168 hours. No results for input(s): AMMONIA in the last 168 hours. Coagulation Profile: Recent Labs  Lab 02/10/18 1135 02/11/18 0320 02/12/18 0342 02/13/18 0400 02/14/18 0406  INR 2.72 2.21 1.43 1.37 1.39   Cardiac Enzymes: Recent Labs  Lab 02/08/18 0334 02/13/18 0400  CKTOTAL 105 64   BNP (last 3 results) No results for input(s): PROBNP in the last 8760 hours. HbA1C: No results for input(s): HGBA1C in the last 72 hours. CBG: Recent Labs  Lab 02/13/18 0732 02/13/18 1131 02/13/18 1639 02/13/18 2045 02/14/18 0732  GLUCAP 115* 117* 103* 118* 121*   Lipid Profile: No results for input(s): CHOL, HDL, LDLCALC, TRIG, CHOLHDL, LDLDIRECT in the last 72 hours. Thyroid Function Tests: No results for input(s): TSH, T4TOTAL, FREET4, T3FREE, THYROIDAB in the last 72 hours. Anemia Panel: No results for input(s): VITAMINB12, FOLATE, FERRITIN, TIBC, IRON, RETICCTPCT in the last 72 hours. Sepsis Labs: No results for input(s): PROCALCITON, LATICACIDVEN in the last 168 hours.  No results found for this or any previous visit (from the past 240 hour(s)).       Radiology Studies: Ir Fluoro Guide Cv  Line Right  Result Date: 02/13/2018 INDICATION: 61 year old male with end-stage renal disease on hemodialysis. He currently has a right IJ approach non tunneled hemodialysis catheter and requires a tunneled hemodialysis catheter. EXAM: TUNNELED CENTRAL VENOUS HEMODIALYSIS CATHETER PLACEMENT WITH ULTRASOUND AND FLUOROSCOPIC GUIDANCE MEDICATIONS: 2 g Ancef. The antibiotic was given in an appropriate time interval prior to skin puncture. ANESTHESIA/SEDATION: Moderate (conscious) sedation was employed during this procedure. A total of Versed 1.5 mg and Fentanyl 75 mcg was administered intravenously. Moderate Sedation Time: 17 minutes. The patient's level of consciousness and vital signs were monitored continuously by radiology nursing throughout the procedure under my direct supervision. FLUOROSCOPY TIME:  Fluoroscopy Time: 0 minutes 30 seconds (6 mGy). COMPLICATIONS: None immediate. PROCEDURE: Informed written consent was obtained from the patient after a discussion of the risks, benefits, and alternatives to treatment. Questions regarding the procedure were encouraged and answered. The right neck and chest were prepped with chlorhexidine in a sterile fashion, and a sterile drape was applied covering the operative field. Maximum barrier sterile technique with sterile gowns and gloves  were used for the procedure. A timeout was performed prior to the initiation of the procedure. After creating a small venotomy incision, a micropuncture kit was utilized to access the right internal jugular vein under direct, real-time ultrasound guidance after the overlying soft tissues were anesthetized with 1% lidocaine with epinephrine. Ultrasound image documentation was performed. The microwire was kinked to measure appropriate catheter length. A stiff Glidewire was advanced to the level of the IVC and the micropuncture sheath was exchanged for a peel-away sheath. A Palindrome tunneled hemodialysis catheter measuring 19 cm from tip  to cuff was tunneled in a retrograde fashion from the anterior chest wall to the venotomy incision. The catheter was then placed through the peel-away sheath with tips ultimately positioned within the superior aspect of the right atrium. Final catheter positioning was confirmed and documented with a spot radiographic image. The catheter aspirates and flushes normally. The catheter was flushed with appropriate volume heparin dwells. The catheter exit site was secured with a 0-Prolene retention suture. The venotomy incision was closed with an interrupted 4-0 Vicryl, Dermabond and Steri-strips. Dressings were applied. The patient tolerated the procedure well without immediate post procedural complication. IMPRESSION: Successful placement of 19 cm tip to cuff tunneled hemodialysis catheter via the right internal jugular vein with tips terminating within the superior aspect of the right atrium. The catheter is ready for immediate use. Electronically Signed   By: Jacqulynn Cadet M.D.   On: 02/13/2018 16:17   Ir US Guide Vasc Access Right  Result Date: 02/13/2018 INDICATION: 62 year old male with end-stage renal disease on hemodialysis. He currently has a right IJ approach non tunneled hemodialysis catheter and requires a tunneled hemodialysis catheter. EXAM: TUNNELED CENTRAL VENOUS HEMODIALYSIS CATHETER PLACEMENT WITH ULTRASOUND AND FLUOROSCOPIC GUIDANCE MEDICATIONS: 2 g Ancef. The antibiotic was given in an appropriate time interval prior to skin puncture. ANESTHESIA/SEDATION: Moderate (conscious) sedation was employed during this procedure. A total of Versed 1.5 mg and Fentanyl 75 mcg was administered intravenously. Moderate Sedation Time: 17 minutes. The patient's level of consciousness and vital signs were monitored continuously by radiology nursing throughout the procedure under my direct supervision. FLUOROSCOPY TIME:  Fluoroscopy Time: 0 minutes 30 seconds (6 mGy). COMPLICATIONS: None immediate. PROCEDURE:  Informed written consent was obtained from the patient after a discussion of the risks, benefits, and alternatives to treatment. Questions regarding the procedure were encouraged and answered. The right neck and chest were prepped with chlorhexidine in a sterile fashion, and a sterile drape was applied covering the operative field. Maximum barrier sterile technique with sterile gowns and gloves were used for the procedure. A timeout was performed prior to the initiation of the procedure. After creating a small venotomy incision, a micropuncture kit was utilized to access the right internal jugular vein under direct, real-time ultrasound guidance after the overlying soft tissues were anesthetized with 1% lidocaine with epinephrine. Ultrasound image documentation was performed. The microwire was kinked to measure appropriate catheter length. A stiff Glidewire was advanced to the level of the IVC and the micropuncture sheath was exchanged for a peel-away sheath. A Palindrome tunneled hemodialysis catheter measuring 19 cm from tip to cuff was tunneled in a retrograde fashion from the anterior chest wall to the venotomy incision. The catheter was then placed through the peel-away sheath with tips ultimately positioned within the superior aspect of the right atrium. Final catheter positioning was confirmed and documented with a spot radiographic image. The catheter aspirates and flushes normally. The catheter was flushed with appropriate volume heparin  dwells. The catheter exit site was secured with a 0-Prolene retention suture. The venotomy incision was closed with an interrupted 4-0 Vicryl, Dermabond and Steri-strips. Dressings were applied. The patient tolerated the procedure well without immediate post procedural complication. IMPRESSION: Successful placement of 19 cm tip to cuff tunneled hemodialysis catheter via the right internal jugular vein with tips terminating within the superior aspect of the right atrium. The  catheter is ready for immediate use. Electronically Signed   By: Jacqulynn Cadet M.D.   On: 02/13/2018 16:17        Scheduled Meds: . Chlorhexidine Gluconate Cloth  6 each Topical Q0600  . darbepoetin (ARANESP) injection - DIALYSIS  60 mcg Intravenous Q Fri-HD  . feeding supplement  1 Container Oral BID BM  . feeding supplement (PRO-STAT SUGAR FREE 64)  30 mL Oral Daily  . Gerhardt's butt cream   Topical TID  . insulin aspart  0-5 Units Subcutaneous QHS  . insulin aspart  0-9 Units Subcutaneous TID WC  . insulin aspart  4 Units Subcutaneous TID WC  . insulin glargine  22 Units Subcutaneous QHS  . latanoprost  1 drop Both Eyes QHS  . mouth rinse  15 mL Mouth Rinse BID  . metoprolol tartrate  75 mg Oral BID  . nystatin   Topical BID  . pantoprazole  40 mg Oral Q1200  . traZODone  50 mg Oral QHS  . warfarin  5 mg Oral ONCE-1800  . Warfarin - Pharmacist Dosing Inpatient   Does not apply q1800   Continuous Infusions: . sodium chloride 250 mL (02/13/18 2020)  . DAPTOmycin (CUBICIN)  IV 700 mg (02/13/18 2021)     LOS: 18 days    Time spent: 35 minutes.     Elmarie Shiley, MD [facility] Pager [phone]  If 7PM-7AM, please contact night-coverage [email or web address] Password [email or web address] 02/14/2018, 2:12 PM

## 2018-02-14 NOTE — Clinical Social Work Note (Signed)
CSW continuing to follow and [facility] is awaiting insurance authorization. CSW will continue to follow through discharge and will provide any necessary SW intervention services.  Braileigh Landenberger Givens, MSW, LCSW Licensed Clinical Social Worker [place] [phone]

## 2018-02-14 NOTE — Procedures (Signed)
I was present at this dialysis session. I have reviewed the session itself and made appropriate changes.   3L UF goal tolerating well. Hb 8.2.  Start ESA today.   S/p TDC yesterday. Possible DC to LTACH at this time.    Filed Weights   02/12/18 1125 02/12/18 2034 02/14/18 0950  Weight: 113 kg 113 kg 114 kg    Recent Labs  Lab 02/14/18 0406  NA 134*  K 3.7  CL 97*  CO2 26  GLUCOSE 107*  BUN 55*  CREATININE 5.19*  CALCIUM 7.0*  PHOS 5.6*    Recent Labs  Lab 02/10/18 0714 02/13/18 0400 02/14/18 0406  WBC 13.0* 8.8 9.8  HGB 8.7* 8.1* 8.2*  HCT 27.8* 26.3* 26.3*  MCV 89.4 90.4 90.4  PLT 140* 174 204    Scheduled Meds: . Chlorhexidine Gluconate Cloth  6 each Topical Q0600  . feeding supplement  1 Container Oral BID BM  . feeding supplement (PRO-STAT SUGAR FREE 64)  30 mL Oral Daily  . Gerhardt's butt cream   Topical TID  . insulin aspart  0-5 Units Subcutaneous QHS  . insulin aspart  0-9 Units Subcutaneous TID WC  . insulin aspart  4 Units Subcutaneous TID WC  . insulin glargine  22 Units Subcutaneous QHS  . latanoprost  1 drop Both Eyes QHS  . mouth rinse  15 mL Mouth Rinse BID  . metoprolol tartrate  75 mg Oral BID  . nystatin   Topical BID  . pantoprazole  40 mg Oral Q1200  . traZODone  50 mg Oral QHS  . warfarin  5 mg Oral ONCE-1800  . Warfarin - Pharmacist Dosing Inpatient   Does not apply q1800   Continuous Infusions: . sodium chloride 250 mL (02/13/18 2020)  . DAPTOmycin (CUBICIN)  IV 700 mg (02/13/18 2021)   PRN Meds:.sodium chloride, acetaminophen **OR** acetaminophen, clonazepam, ondansetron **OR** ondansetron (ZOFRAN) IV, sodium chloride flush   Pearson Grippe  MD 02/14/2018, 11:11 AM

## 2018-02-14 NOTE — Progress Notes (Signed)
Pharmacy Antibiotic Note  [name] is a 62 y.o. male  with MSSA bacteremia and TV endocarditis and noted with severe AKI, thought AIN 2/2 cefazolin. Pharmacy dosing daptomycin (9/25>>) -CK= 33>>239>>105>>64  Plan: Continue daptomycin 700 mg (~8mg /kg) IV q48h Will check a CK weekly  Height: 5\' 10"  (177.8 cm) Weight: 249 lb 1.9 oz (113 kg) IBW/kg (Calculated) : 73  Temp (24hrs), Avg:97.9 F (36.6 C), Min:97.6 F (36.4 C), Max:98.1 F (36.7 C)  Recent Labs  Lab 02/09/18 0340 02/10/18 0713 02/10/18 0714 02/11/18 0320 02/12/18 0342 02/13/18 0400 02/14/18 0406  WBC 11.8*  --  13.0*  --   --  8.8 9.8  CREATININE 6.47* 7.33*  --  5.04* 5.81* 4.22* 5.19*    Estimated Creatinine Clearance: 18.8 mL/min (A) (by C-G formula based on SCr of 5.19 mg/dL (H)).    Allergies  Allergen Reactions  . Ace Inhibitors Cough   Alyviah Crandle A. Levada Dy, PharmD, [place] Pager: [phone] Please utilize Amion for appropriate phone number to reach the unit pharmacist ([place])

## 2018-02-14 NOTE — Progress Notes (Signed)
[place] for warfarin  Indication: atrial fibrillation  Allergies  Allergen Reactions  . Ace Inhibitors Cough    Patient Measurements: Height: 5\' 10"  (177.8 cm) Weight: 249 lb 1.9 oz (113 kg) IBW/kg (Calculated) : 73   Vital Signs: Temp: 97.6 F (36.4 C) (10/11 0525) Temp Source: Oral (10/11 0525) BP: 130/63 (10/11 0733) Pulse Rate: 84 (10/11 0733)  Labs: Recent Labs    02/12/18 0342 02/13/18 0400 02/14/18 0406  HGB  --  8.1* 8.2*  HCT  --  26.3* 26.3*  PLT  --  174 204  LABPROT 17.4* 16.8* 17.0*  INR 1.43 1.37 1.39  CREATININE 5.81* 4.22* 5.19*  CKTOTAL  --  64  --     Estimated Creatinine Clearance: 18.8 mL/min (A) (by C-G formula based on SCr of 5.19 mg/dL (H)).   Medical History: Past Medical History:  Diagnosis Date  . Bicuspid aortic valve    a. 02/2009 s/p tissue AVR;  b. 07/2014 Echo: EF 55-60%, basal-mid inf HK, mild AS/MR, mod dil LA, PASP 44 mmHg.  Marland Kitchen CAD (coronary artery disease) October 2010   a. 02/2009 NSTEMI/Cath: 3VD; b. 02/2009 CABG x 5: LIMA->LAD, VG->Diag, VG->RI, VG->RVM->RPDA.  Marland Kitchen GERD (gastroesophageal reflux disease)   . Heart murmur   . Hyperlipidemia   . Hypertension   . Myocardial infarction ([place]) 2010  . PVD (peripheral vascular disease) ([place])    a. s/p toe amputations on L foot.  . Type II diabetes mellitus [facility])     Assessment: 62 yo M with new onset PAF on warfarin (started on 10/2).  -INR today = 1.39. Coumadin has been on hold for his HD cath. It has been placed. Ok to resume coumadin. He got 2 doses of vit k on 10/7 and 10/8.   Goal of Therapy:  INR 2-3 Monitor platelets by anticoagulation protocol: Yes   Plan:  Coumadin 5mg  PO x1 Daily PT/INR  Masako Overall A. Levada Dy, PharmD, [place] Pager: [phone] Please utilize Amion for appropriate phone number to reach the unit pharmacist ([place])   02/14/2018 7:44 AM

## 2018-02-14 NOTE — Progress Notes (Signed)
Nutrition Follow-up  DOCUMENTATION CODES:   Obesity unspecified  INTERVENTION:   Nepro Shake po BID, each supplement provides 425 kcal and 19 grams protein  D/C boost breeze  Renal MVI daily  30 ml Prostat po daily, each supplement provides 100 kcals and 15 grams protein.    NUTRITION DIAGNOSIS:   Increased nutrient needs related to wound healing, acute illness(AKI on HD) as evidenced by estimated needs.  Being addressed via supplements  GOAL:   Patient will meet greater than or equal to 90% of their needs  Progressing  MONITOR:   PO intake, Diet advancement, Skin, Labs, I & O's  REASON FOR ASSESSMENT:   LOS    ASSESSMENT:   Mr. Montrose is a 62 yo male with PMH of insulin-dependent type 2 diabetes, HTN, CAD, PVD, right BKA 12/2014, left BKA 01/17/2018, endocarditis who is admitted for acute renal failure.   10/10 TDC placed  HD today  Recorded po intake 50-100% of meals. Noted some missed meals due to dialysis and NPO status for procedures.   Labs: sodium 134, phosphorus 5.6 ,CBGs 103-154, corrected calcium 8.6, albumin 2.0 Meds: aransep, ss novolog, novolog with meals, lantus  Diet Order:   Diet Order            Diet renal with fluid restriction Fluid restriction: 1200 mL Fluid; Room service appropriate? Yes; Fluid consistency: Thin  Diet effective now              EDUCATION NEEDS:   Education needs have been addressed  Skin:  Skin Assessment: Reviewed RN Assessment(moisture associated skin damage buttocks groin; right leg incision (amputation 9/13))  Last BM:  10/11  Height:   Ht Readings from Last 1 Encounters:  01/27/18 5\' 10"  (1.778 m)    Weight:   Wt Readings from Last 1 Encounters:  02/14/18 111.1 kg    Ideal Body Weight:  66.5 kg amputations taken into account  BMI:  Body mass index is 35.14 kg/m.  Estimated Nutritional Needs:   Kcal:  1800-2000 kcals   Protein:  90-100  Fluid:  UOP + 1000 mL   Kerman Passey MS, RD,  LDN, CNSC [phone] Pager  [phone] Weekend/On-Call Pager

## 2018-02-15 LAB — PROTIME-INR
INR: 1.44
Prothrombin Time: 17.4 seconds — ABNORMAL HIGH (ref 11.4–15.2)

## 2018-02-15 LAB — RENAL FUNCTION PANEL
Albumin: 2 g/dL — ABNORMAL LOW (ref 3.5–5.0)
Anion gap: 10 (ref 5–15)
BUN: 26 mg/dL — AB (ref 8–23)
CALCIUM: 7.2 mg/dL — AB (ref 8.9–10.3)
CO2: 27 mmol/L (ref 22–32)
Chloride: 99 mmol/L (ref 98–111)
Creatinine, Ser: 3.65 mg/dL — ABNORMAL HIGH (ref 0.61–1.24)
GFR calc Af Amer: 19 mL/min — ABNORMAL LOW (ref >60)
GFR calc non Af Amer: 17 mL/min — ABNORMAL LOW (ref >60)
GLUCOSE: 110 mg/dL — AB (ref 70–99)
POTASSIUM: 3.9 mmol/L (ref 3.5–5.1)
Phosphorus: 3.8 mg/dL (ref 2.5–4.6)
SODIUM: 136 mmol/L (ref 135–145)

## 2018-02-15 LAB — GLUCOSE, CAPILLARY
GLUCOSE-CAPILLARY: 215 mg/dL — AB (ref 70–99)
Glucose-Capillary: 99 mg/dL (ref 70–99)
Glucose-Capillary: 146 mg/dL — ABNORMAL HIGH (ref 70–99)
Glucose-Capillary: 189 mg/dL — ABNORMAL HIGH (ref 70–99)

## 2018-02-15 LAB — IRON AND TIBC
Iron: 21 ug/dL — ABNORMAL LOW (ref 45–182)
Saturation Ratios: 12 % — ABNORMAL LOW (ref 17.9–39.5)
TIBC: 171 ug/dL — ABNORMAL LOW (ref 250–450)
UIBC: 150 ug/dL

## 2018-02-15 LAB — FERRITIN: FERRITIN: 376 ng/mL — AB (ref 24–336)

## 2018-02-15 MED ORDER — WARFARIN SODIUM 5 MG PO TABS
5.0000 mg | ORAL_TABLET | Freq: Once | ORAL | Status: AC
  Administered 2018-02-15: 5 mg via ORAL
  Filled 2018-02-15: qty 1

## 2018-02-15 NOTE — Progress Notes (Signed)
[place] for warfarin  Indication: atrial fibrillation  Allergies  Allergen Reactions  . Ace Inhibitors Cough    Patient Measurements: Height: 5\' 10"  (177.8 cm) Weight: 244 lb 14.9 oz (111.1 kg) IBW/kg (Calculated) : 73   Vital Signs: Temp: 97.7 F (36.5 C) (10/12 0937) Temp Source: Oral (10/12 0937) BP: 126/64 (10/12 0937) Pulse Rate: 91 (10/12 0937)  Labs: Recent Labs    02/13/18 0400 02/14/18 0406 02/15/18 0402  HGB 8.1* 8.2*  --   HCT 26.3* 26.3*  --   PLT 174 204  --   LABPROT 16.8* 17.0* 17.4*  INR 1.37 1.39 1.44  CREATININE 4.22* 5.19* 3.65*  CKTOTAL 64  --   --     Estimated Creatinine Clearance: 26.5 mL/min (A) (by C-G formula based on SCr of 3.65 mg/dL (H)).   Medical History: Past Medical History:  Diagnosis Date  . Bicuspid aortic valve    a. 02/2009 s/p tissue AVR;  b. 07/2014 Echo: EF 55-60%, basal-mid inf HK, mild AS/MR, mod dil LA, PASP 44 mmHg.  Marland Kitchen CAD (coronary artery disease) October 2010   a. 02/2009 NSTEMI/Cath: 3VD; b. 02/2009 CABG x 5: LIMA->LAD, VG->Diag, VG->RI, VG->RVM->RPDA.  Marland Kitchen GERD (gastroesophageal reflux disease)   . Heart murmur   . Hyperlipidemia   . Hypertension   . Myocardial infarction ([place]) 2010  . PVD (peripheral vascular disease) ([place])    a. s/p toe amputations on L foot.  . Type II diabetes mellitus [facility])     Assessment: 62 yo M with new onset PAF on warfarin (started on 10/2).  -INR today = 1.44. Coumadin had been on hold for his HD cath. He got 2 doses of vit k on 10/7 and 10/8.   Previously therapeutic on lower dose, but this as around the time of fluconazole therapy which may have affected the INR.   Goal of Therapy:  INR 2-3 Monitor platelets by anticoagulation protocol: Yes   Plan:  Coumadin 5mg  PO x1 Daily PT/INR  Tenika Keeran A. Levada Dy, PharmD, [place] Pager: [phone] Please utilize Amion for appropriate phone number to reach the unit  pharmacist ([place])   02/15/2018 10:26 AM

## 2018-02-15 NOTE — Progress Notes (Signed)
[facility] ROUNDING NOTE   Subjective:    HD yesterday, tolerated well.  3 L ultrafiltration  No significant urine output  Labs with increase in BUN and serum creatinine in the intradialytic period  Afebrile, blood pressure stable  Objective:  Vital signs in last 24 hours:  Temp:  [97.7 F (36.5 C)-98.3 F (36.8 C)] 97.7 F (36.5 C) (10/12 0937) Pulse Rate:  [79-97] 91 (10/12 0937) Resp:  [16-20] 18 (10/12 0937) BP: (115-135)/(48-76) 126/64 (10/12 0937) SpO2:  [95 %-97 %] 96 % (10/12 0937) Weight:  [111.1 kg] 111.1 kg (10/11 1401)  Weight change:  Filed Weights   02/12/18 2034 02/14/18 0950 02/14/18 1401  Weight: 113 kg 114 kg 111.1 kg    Intake/Output: I/O last 3 completed shifts: In: 801.2 [P.O.:660; I.V.:27.2; IV Piggyback:114] Out: 4825 [Urine:175; Other:3000; Stool:1]   Intake/Output this shift:  Total I/O In: 240 [P.O.:240] Out: -   CVS- RRR systolic murmur RS-  Normal WOB and clear anteriorly ABD-soft, mod distented abdomen, +BS EXT-generalized edema trace  Access - RIJ Tunneled HD cath   Basic Metabolic Panel: Recent Labs  Lab 02/11/18 0320 02/12/18 0342 02/13/18 0400 02/14/18 0406 02/15/18 0402  NA 133* 133* 134* 134* 136  K 4.3 4.2 3.7 3.7 3.9  CL 98 98 98 97* 99  CO2 '24 24 26 26 27  ' GLUCOSE 148* 133* 137* 107* 110*  BUN 45* 61* 36* 55* 26*  CREATININE 5.04* 5.81* 4.22* 5.19* 3.65*  CALCIUM 6.9* 7.0* 7.2* 7.0* 7.2*  PHOS 5.4* 6.0* 5.0* 5.6* 3.8    Liver Function Tests: Recent Labs  Lab 02/11/18 0320 02/12/18 0342 02/13/18 0400 02/14/18 0406 02/15/18 0402  ALBUMIN 1.9* 2.0* 2.0* 2.0* 2.0*   No results for input(s): LIPASE, AMYLASE in the last 168 hours. No results for input(s): AMMONIA in the last 168 hours.  CBC: Recent Labs  Lab 02/09/18 0340 02/10/18 0714 02/13/18 0400 02/14/18 0406  WBC 11.8* 13.0* 8.8 9.8  HGB 8.5* 8.7* 8.1* 8.2*  HCT 27.1* 27.8* 26.3* 26.3*  MCV 91.2 89.4 90.4 90.4  PLT 142*  140* 174 204    Cardiac Enzymes: Recent Labs  Lab 02/13/18 0400  CKTOTAL 64    BNP: Invalid input(s): POCBNP  CBG: Recent Labs  Lab 02/14/18 0732 02/14/18 1448 02/14/18 1641 02/14/18 2128 02/15/18 0822  GLUCAP 121* 103* 147* 144* 99    Microbiology: Results for orders placed or performed during the hospital encounter of 01/27/18  Urine culture     Status: None   Collection Time: 01/28/18  4:29 PM  Result Value Ref Range Status   Specimen Description URINE, CATHETERIZED  Final   Special Requests NONE  Final   Culture   Final    NO GROWTH Performed at [facility], [place] [address], [place], [place] [address]    Report Status 01/29/2018 FINAL  Final    Coagulation Studies: Recent Labs    02/13/18 0400 02/14/18 0406 02/15/18 0402  LABPROT 16.8* 17.0* 17.4*  INR 1.37 1.39 1.44    Urinalysis: No results for input(s): COLORURINE, LABSPEC, PHURINE, GLUCOSEU, HGBUR, BILIRUBINUR, KETONESUR, PROTEINUR, UROBILINOGEN, NITRITE, LEUKOCYTESUR in the last 72 hours.  Invalid input(s): APPERANCEUR    Imaging: Ir Fluoro Guide Cv Line Right  Result Date: 02/13/2018 INDICATION: 63 year old male with end-stage renal disease on hemodialysis. He currently has a right IJ approach non tunneled hemodialysis catheter and requires a tunneled hemodialysis catheter. EXAM: TUNNELED CENTRAL VENOUS HEMODIALYSIS CATHETER PLACEMENT WITH ULTRASOUND AND FLUOROSCOPIC GUIDANCE MEDICATIONS:  2 g Ancef. The antibiotic was given in an appropriate time interval prior to skin puncture. ANESTHESIA/SEDATION: Moderate (conscious) sedation was employed during this procedure. A total of Versed 1.5 mg and Fentanyl 75 mcg was administered intravenously. Moderate Sedation Time: 17 minutes. The patient's level of consciousness and vital signs were monitored continuously by radiology nursing throughout the procedure under my direct supervision. FLUOROSCOPY TIME:  Fluoroscopy Time: 0 minutes 30 seconds (6 mGy).  COMPLICATIONS: None immediate. PROCEDURE: Informed written consent was obtained from the patient after a discussion of the risks, benefits, and alternatives to treatment. Questions regarding the procedure were encouraged and answered. The right neck and chest were prepped with chlorhexidine in a sterile fashion, and a sterile drape was applied covering the operative field. Maximum barrier sterile technique with sterile gowns and gloves were used for the procedure. A timeout was performed prior to the initiation of the procedure. After creating a small venotomy incision, a micropuncture kit was utilized to access the right internal jugular vein under direct, real-time ultrasound guidance after the overlying soft tissues were anesthetized with 1% lidocaine with epinephrine. Ultrasound image documentation was performed. The microwire was kinked to measure appropriate catheter length. A stiff Glidewire was advanced to the level of the IVC and the micropuncture sheath was exchanged for a peel-away sheath. A Palindrome tunneled hemodialysis catheter measuring 19 cm from tip to cuff was tunneled in a retrograde fashion from the anterior chest wall to the venotomy incision. The catheter was then placed through the peel-away sheath with tips ultimately positioned within the superior aspect of the right atrium. Final catheter positioning was confirmed and documented with a spot radiographic image. The catheter aspirates and flushes normally. The catheter was flushed with appropriate volume heparin dwells. The catheter exit site was secured with a 0-Prolene retention suture. The venotomy incision was closed with an interrupted 4-0 Vicryl, Dermabond and Steri-strips. Dressings were applied. The patient tolerated the procedure well without immediate post procedural complication. IMPRESSION: Successful placement of 19 cm tip to cuff tunneled hemodialysis catheter via the right internal jugular vein with tips terminating within  the superior aspect of the right atrium. The catheter is ready for immediate use. Electronically Signed   By: Jacqulynn Cadet M.D.   On: 02/13/2018 16:17   Ir US Guide Vasc Access Right  Result Date: 02/13/2018 INDICATION: 62 year old male with end-stage renal disease on hemodialysis. He currently has a right IJ approach non tunneled hemodialysis catheter and requires a tunneled hemodialysis catheter. EXAM: TUNNELED CENTRAL VENOUS HEMODIALYSIS CATHETER PLACEMENT WITH ULTRASOUND AND FLUOROSCOPIC GUIDANCE MEDICATIONS: 2 g Ancef. The antibiotic was given in an appropriate time interval prior to skin puncture. ANESTHESIA/SEDATION: Moderate (conscious) sedation was employed during this procedure. A total of Versed 1.5 mg and Fentanyl 75 mcg was administered intravenously. Moderate Sedation Time: 17 minutes. The patient's level of consciousness and vital signs were monitored continuously by radiology nursing throughout the procedure under my direct supervision. FLUOROSCOPY TIME:  Fluoroscopy Time: 0 minutes 30 seconds (6 mGy). COMPLICATIONS: None immediate. PROCEDURE: Informed written consent was obtained from the patient after a discussion of the risks, benefits, and alternatives to treatment. Questions regarding the procedure were encouraged and answered. The right neck and chest were prepped with chlorhexidine in a sterile fashion, and a sterile drape was applied covering the operative field. Maximum barrier sterile technique with sterile gowns and gloves were used for the procedure. A timeout was performed prior to the initiation of the procedure. After creating a small venotomy incision,  a micropuncture kit was utilized to access the right internal jugular vein under direct, real-time ultrasound guidance after the overlying soft tissues were anesthetized with 1% lidocaine with epinephrine. Ultrasound image documentation was performed. The microwire was kinked to measure appropriate catheter length. A stiff  Glidewire was advanced to the level of the IVC and the micropuncture sheath was exchanged for a peel-away sheath. A Palindrome tunneled hemodialysis catheter measuring 19 cm from tip to cuff was tunneled in a retrograde fashion from the anterior chest wall to the venotomy incision. The catheter was then placed through the peel-away sheath with tips ultimately positioned within the superior aspect of the right atrium. Final catheter positioning was confirmed and documented with a spot radiographic image. The catheter aspirates and flushes normally. The catheter was flushed with appropriate volume heparin dwells. The catheter exit site was secured with a 0-Prolene retention suture. The venotomy incision was closed with an interrupted 4-0 Vicryl, Dermabond and Steri-strips. Dressings were applied. The patient tolerated the procedure well without immediate post procedural complication. IMPRESSION: Successful placement of 19 cm tip to cuff tunneled hemodialysis catheter via the right internal jugular vein with tips terminating within the superior aspect of the right atrium. The catheter is ready for immediate use. Electronically Signed   By: Jacqulynn Cadet M.D.   On: 02/13/2018 16:17     Medications:   . sodium chloride 250 mL (02/13/18 2020)  . DAPTOmycin (CUBICIN)  IV 700 mg (02/13/18 2021)   . Chlorhexidine Gluconate Cloth  6 each Topical Q0600  . darbepoetin (ARANESP) injection - DIALYSIS  60 mcg Intravenous Q Fri-HD  . feeding supplement (NEPRO CARB STEADY)  237 mL Oral BID BM  . feeding supplement (PRO-STAT SUGAR FREE 64)  30 mL Oral Daily  . Gerhardt's butt cream   Topical TID  . insulin aspart  0-5 Units Subcutaneous QHS  . insulin aspart  0-9 Units Subcutaneous TID WC  . insulin aspart  4 Units Subcutaneous TID WC  . insulin glargine  22 Units Subcutaneous QHS  . latanoprost  1 drop Both Eyes QHS  . mouth rinse  15 mL Mouth Rinse BID  . metoprolol tartrate  75 mg Oral BID  . multivitamin   1 tablet Oral QHS  . nystatin   Topical BID  . pantoprazole  40 mg Oral Q1200  . traZODone  50 mg Oral QHS  . warfarin  5 mg Oral ONCE-1800  . Warfarin - Pharmacist Dosing Inpatient   Does not apply q1800   sodium chloride, acetaminophen **OR** acetaminophen, clonazepam, ondansetron **OR** ondansetron (ZOFRAN) IV, sodium chloride flush  Assessment/ Plan:    1.  AKI, severe, oliguric:    Cr 01/22/18 1.13 but 9/23 9.6.  He has no clear nephrotoxic events (did have low PO intake days prior to presentation) but working diagnosis is ATN versus AIN from ancef which has been discontinued.  Empiric hydration was not helpful. CT renal stone protocol unrevealing.  He initiated RRT for volume overload.  Doubt acute glomerular process.    Continue to provide supportive care and await renal recovery which is still possible.    Next HD on Monday 10/14  Has tunneled dialysis catheter at the current time  2. Atrial fibrillation with RVR:  Currently in NSR with normal BP.  On metoprolol 75 BID.   3.  Anemia: Hemoglobin stable in the 8s, on ESA.  Will check iron levels  4. MSSA bacteremia and tricuspid valve endocarditis TEE 01/21/2018 patient started on daptomycin  appreciate Dr. Algis Downs input for infectious disease started 01/30/2018 daptomycin 700 mg every 48 hours  5.  Diabetes mellitus per primary team     LOS: 19 [name] B '@TODAY' '@10' :37 AM

## 2018-02-15 NOTE — Progress Notes (Signed)
PROGRESS NOTE    [name]  ELT:[id number] DOB: [birth year] DOA: 01/27/2018 PCP: [name], MD   Brief Narrative: 62 year old man complicated PMH hospitalized September for sepsis secondary to right foot osteomyelitis, underwent BKA, found to have MSSA bacteremia with splenic infarct and tricuspid valve endocarditis, discharged on Ancef.  Developed acute oliguria and presented to the emergency department where he was found to have acute kidney injury.  Seen by nephrology and infectious disease, antibiotics changed to daptomycin.  Developed acute respiratory failure necessitating transfer to ICU and critical care service.  Temporary dialysis catheter was placed and the patient was started on hemodialysis.  Respiratory status improved and he was transferred back out to the hospital service.   Assessment & Plan:   Principal Problem:   Acute renal failure (ARF) (HCC) Active Problems:   Insulin-requiring or dependent type II diabetes mellitus ([place])   Essential hypertension   CAD (coronary artery disease)   Leukocytosis   Thrombocytopenia (HCC)   S/P BKA (below knee amputation) unilateral, right (HCC)   Endocarditis of tricuspid valve   Hyponatremia   PAF (paroxysmal atrial fibrillation) (HCC)   Aortic atherosclerosis (HCC)   Acute hypoxemic respiratory failure (HCC)   AKI, oliguric; unclear etiology. Could have been from ATN Vs AIN from ancef. Ct negative.  Continue to require HD>  Had tunneled catheter placement, 10-10 Nephrology following.  Had HD 10-12  PAF;  New diagnosis.  Resume coumadin. INR 1.4 Continue with metorpolol. Follow-up with Dr. Percival Spanish 3-4 weeks after hospitalization.  Acute hypoxic respiratory failure; pulmonary edema. Resolved.   MSSA Bacteremia , tricuspid valve endocarditis; splenic infarct diagnosed 9-17.  On Daptomycin per infectious diseases.  ESR elevated. Needs follow up   DM type 2; On lantus and SSI.    PVD s/p Bilateral BKA.    Dr Sarajane Jews spoke with Dr Sharol Given regarding staple removal.   Oral candidiasis Treated with fluconazole  Thrombocytopenia, stable.  Seen September 2019 as well.    Anemia of critical illness,  Monitor hb.   Obesity Continue management per dietician  Loss of cortex along the anterior inferior aspect of the T12 vertebral body as well as lucency in the posterior aspect of the L1 vertebral body. Question a degree of discitis/osteomyelitis in this region. This finding may warrant MR pre and post-contrast to further evaluate with respect to discitis/bony destruction. --Case was discussed with Dr. [name].  No change to therapy recommended.  Continue daptomycin.  Diarrhea; check stool culture.  Lactose free diet   DVT prophylaxis: coumadin on hold for procedure.  Code Status: full code.  Family Communication: family at bedside.  Disposition Plan: insurance decline LTAC will try to do Peer to peer on Monday.   Consultants:   ID  Nephrology    Procedures:   Antimicrobials: Daptomycin.   Subjective: Had an episode of diarrhea after eating.  He drinks lactose free milk.    Objective: Vitals:   02/14/18 1734 02/14/18 2116 02/15/18 0557 02/15/18 0937  BP: (!) 123/57 (!) 115/58 133/70 126/64  Pulse: 85 85 79 91  Resp: _0 Temp: 98.1 F (36.7 C) 97.8 F (36.6 C) 98.3 F (36.8 C) 97.7 F (36.5 C)  TempSrc: Oral Oral Oral Oral  SpO2: 95% 97% 96% 96%  Weight:      Height:        Intake/Output Summary (Last 24 hours) at 02/15/2018 1354 Last data filed at 02/15/2018 0942 Gross per 24 hour  Intake 250 ml  Output 3050  ml  Net -2800 ml   Filed Weights   02/12/18 2034 02/14/18 0950 02/14/18 1401  Weight: 113 kg 114 kg 111.1 kg    Examination:  General exam: NAD Respiratory system; CTA Cardiovascular system: S 1, S 2 RRR Gastrointestinal system: BS present, soft, nt Central nervous system; Alert  Extremities: Bilateral AKA Skin: no rashes.     Data  Reviewed: I have personally reviewed following labs and imaging studies  CBC: Recent Labs  Lab 02/09/18 0340 02/10/18 0714 02/13/18 0400 02/14/18 0406  WBC 11.8* 13.0* 8.8 9.8  HGB 8.5* 8.7* 8.1* 8.2*  HCT 27.1* 27.8* 26.3* 26.3*  MCV 91.2 89.4 90.4 90.4  PLT 142* 140* 174 314   Basic Metabolic Panel: Recent Labs  Lab 02/11/18 0320 02/12/18 0342 02/13/18 0400 02/14/18 0406 02/15/18 0402  NA 133* 133* 134* 134* 136  K 4.3 4.2 3.7 3.7 3.9  CL 98 98 98 97* 99  CO2 _0 GLUCOSE 148* 133* 137* 107* 110*  BUN 45* 61* 36* 55* 26*  CREATININE 5.04* 5.81* 4.22* 5.19* 3.65*  CALCIUM 6.9* 7.0* 7.2* 7.0* 7.2*  PHOS 5.4* 6.0* 5.0* 5.6* 3.8   GFR: Estimated Creatinine Clearance: 26.5 mL/min (A) (by C-G formula based on SCr of 3.65 mg/dL (H)). Liver Function Tests: Recent Labs  Lab 02/11/18 0320 02/12/18 0342 02/13/18 0400 02/14/18 0406 02/15/18 0402  ALBUMIN 1.9* 2.0* 2.0* 2.0* 2.0*   No results for input(s): LIPASE, AMYLASE in the last 168 hours. No results for input(s): AMMONIA in the last 168 hours. Coagulation Profile: Recent Labs  Lab 02/11/18 0320 02/12/18 0342 02/13/18 0400 02/14/18 0406 02/15/18 0402  INR 2.21 1.43 1.37 1.39 1.44   Cardiac Enzymes: Recent Labs  Lab 02/13/18 0400  CKTOTAL 64   BNP (last 3 results) No results for input(s): PROBNP in the last 8760 hours. HbA1C: No results for input(s): HGBA1C in the last 72 hours. CBG: Recent Labs  Lab 02/14/18 1448 02/14/18 1641 02/14/18 2128 02/15/18 0822 02/15/18 1213  GLUCAP 103* 147* 144* 99 146*   Lipid Profile: No results for input(s): CHOL, HDL, LDLCALC, TRIG, CHOLHDL, LDLDIRECT in the last 72 hours. Thyroid Function Tests: No results for input(s): TSH, T4TOTAL, FREET4, T3FREE, THYROIDAB in the last 72 hours. Anemia Panel: No results for input(s): VITAMINB12, FOLATE, FERRITIN, TIBC, IRON, RETICCTPCT in the last 72 hours. Sepsis Labs: No results for input(s): PROCALCITON,  LATICACIDVEN in the last 168 hours.  No results found for this or any previous visit (from the past 240 hour(s)).       Radiology Studies: Ir Fluoro Guide Cv Line Right  Result Date: 02/13/2018 INDICATION: 62 year old male with end-stage renal disease on hemodialysis. He currently has a right IJ approach non tunneled hemodialysis catheter and requires a tunneled hemodialysis catheter. EXAM: TUNNELED CENTRAL VENOUS HEMODIALYSIS CATHETER PLACEMENT WITH ULTRASOUND AND FLUOROSCOPIC GUIDANCE MEDICATIONS: 2 g Ancef. The antibiotic was given in an appropriate time interval prior to skin puncture. ANESTHESIA/SEDATION: Moderate (conscious) sedation was employed during this procedure. A total of Versed 1.5 mg and Fentanyl 75 mcg was administered intravenously. Moderate Sedation Time: 17 minutes. The patient's level of consciousness and vital signs were monitored continuously by radiology nursing throughout the procedure under my direct supervision. FLUOROSCOPY TIME:  Fluoroscopy Time: 0 minutes 30 seconds (6 mGy). COMPLICATIONS: None immediate. PROCEDURE: Informed written consent was obtained from the patient after a discussion of the risks, benefits, and alternatives to treatment. Questions regarding the procedure were encouraged and answered. The right  neck and chest were prepped with chlorhexidine in a sterile fashion, and a sterile drape was applied covering the operative field. Maximum barrier sterile technique with sterile gowns and gloves were used for the procedure. A timeout was performed prior to the initiation of the procedure. After creating a small venotomy incision, a micropuncture kit was utilized to access the right internal jugular vein under direct, real-time ultrasound guidance after the overlying soft tissues were anesthetized with 1% lidocaine with epinephrine. Ultrasound image documentation was performed. The microwire was kinked to measure appropriate catheter length. A stiff Glidewire was  advanced to the level of the IVC and the micropuncture sheath was exchanged for a peel-away sheath. A Palindrome tunneled hemodialysis catheter measuring 19 cm from tip to cuff was tunneled in a retrograde fashion from the anterior chest wall to the venotomy incision. The catheter was then placed through the peel-away sheath with tips ultimately positioned within the superior aspect of the right atrium. Final catheter positioning was confirmed and documented with a spot radiographic image. The catheter aspirates and flushes normally. The catheter was flushed with appropriate volume heparin dwells. The catheter exit site was secured with a 0-Prolene retention suture. The venotomy incision was closed with an interrupted 4-0 Vicryl, Dermabond and Steri-strips. Dressings were applied. The patient tolerated the procedure well without immediate post procedural complication. IMPRESSION: Successful placement of 19 cm tip to cuff tunneled hemodialysis catheter via the right internal jugular vein with tips terminating within the superior aspect of the right atrium. The catheter is ready for immediate use. Electronically Signed   By: Jacqulynn Cadet M.D.   On: 02/13/2018 16:17   Ir US Guide Vasc Access Right  Result Date: 02/13/2018 INDICATION: 62 year old male with end-stage renal disease on hemodialysis. He currently has a right IJ approach non tunneled hemodialysis catheter and requires a tunneled hemodialysis catheter. EXAM: TUNNELED CENTRAL VENOUS HEMODIALYSIS CATHETER PLACEMENT WITH ULTRASOUND AND FLUOROSCOPIC GUIDANCE MEDICATIONS: 2 g Ancef. The antibiotic was given in an appropriate time interval prior to skin puncture. ANESTHESIA/SEDATION: Moderate (conscious) sedation was employed during this procedure. A total of Versed 1.5 mg and Fentanyl 75 mcg was administered intravenously. Moderate Sedation Time: 17 minutes. The patient's level of consciousness and vital signs were monitored continuously by radiology  nursing throughout the procedure under my direct supervision. FLUOROSCOPY TIME:  Fluoroscopy Time: 0 minutes 30 seconds (6 mGy). COMPLICATIONS: None immediate. PROCEDURE: Informed written consent was obtained from the patient after a discussion of the risks, benefits, and alternatives to treatment. Questions regarding the procedure were encouraged and answered. The right neck and chest were prepped with chlorhexidine in a sterile fashion, and a sterile drape was applied covering the operative field. Maximum barrier sterile technique with sterile gowns and gloves were used for the procedure. A timeout was performed prior to the initiation of the procedure. After creating a small venotomy incision, a micropuncture kit was utilized to access the right internal jugular vein under direct, real-time ultrasound guidance after the overlying soft tissues were anesthetized with 1% lidocaine with epinephrine. Ultrasound image documentation was performed. The microwire was kinked to measure appropriate catheter length. A stiff Glidewire was advanced to the level of the IVC and the micropuncture sheath was exchanged for a peel-away sheath. A Palindrome tunneled hemodialysis catheter measuring 19 cm from tip to cuff was tunneled in a retrograde fashion from the anterior chest wall to the venotomy incision. The catheter was then placed through the peel-away sheath with tips ultimately positioned within the superior aspect  of the right atrium. Final catheter positioning was confirmed and documented with a spot radiographic image. The catheter aspirates and flushes normally. The catheter was flushed with appropriate volume heparin dwells. The catheter exit site was secured with a 0-Prolene retention suture. The venotomy incision was closed with an interrupted 4-0 Vicryl, Dermabond and Steri-strips. Dressings were applied. The patient tolerated the procedure well without immediate post procedural complication. IMPRESSION: Successful  placement of 19 cm tip to cuff tunneled hemodialysis catheter via the right internal jugular vein with tips terminating within the superior aspect of the right atrium. The catheter is ready for immediate use. Electronically Signed   By: Jacqulynn Cadet M.D.   On: 02/13/2018 16:17        Scheduled Meds: . Chlorhexidine Gluconate Cloth  6 each Topical Q0600  . darbepoetin (ARANESP) injection - DIALYSIS  60 mcg Intravenous Q Fri-HD  . feeding supplement (NEPRO CARB STEADY)  237 mL Oral BID BM  . feeding supplement (PRO-STAT SUGAR FREE 64)  30 mL Oral Daily  . Gerhardt's butt cream   Topical TID  . insulin aspart  0-5 Units Subcutaneous QHS  . insulin aspart  0-9 Units Subcutaneous TID WC  . insulin aspart  4 Units Subcutaneous TID WC  . insulin glargine  22 Units Subcutaneous QHS  . latanoprost  1 drop Both Eyes QHS  . mouth rinse  15 mL Mouth Rinse BID  . metoprolol tartrate  75 mg Oral BID  . multivitamin  1 tablet Oral QHS  . nystatin   Topical BID  . pantoprazole  40 mg Oral Q1200  . traZODone  50 mg Oral QHS  . warfarin  5 mg Oral ONCE-1800  . Warfarin - Pharmacist Dosing Inpatient   Does not apply q1800   Continuous Infusions: . sodium chloride 250 mL (02/13/18 2020)  . DAPTOmycin (CUBICIN)  IV 700 mg (02/13/18 2021)     LOS: 19 days    Time spent: 35 minutes.     Elmarie Shiley, MD [facility] Pager [phone]  If 7PM-7AM, please contact night-coverage [email or web address] Password [email or web address] 02/15/2018, 1:54 PM

## 2018-02-16 LAB — RENAL FUNCTION PANEL
Albumin: 2 g/dL — ABNORMAL LOW (ref 3.5–5.0)
Anion gap: 9 (ref 5–15)
BUN: 42 mg/dL — AB (ref 8–23)
CO2: 26 mmol/L (ref 22–32)
CREATININE: 4.66 mg/dL — AB (ref 0.61–1.24)
Calcium: 7.2 mg/dL — ABNORMAL LOW (ref 8.9–10.3)
Chloride: 100 mmol/L (ref 98–111)
GFR calc non Af Amer: 12 mL/min — ABNORMAL LOW (ref >60)
GFR, EST AFRICAN AMERICAN: 14 mL/min — AB (ref >60)
Glucose, Bld: 208 mg/dL — ABNORMAL HIGH (ref 70–99)
POTASSIUM: 4 mmol/L (ref 3.5–5.1)
Phosphorus: 4.4 mg/dL (ref 2.5–4.6)
Sodium: 135 mmol/L (ref 135–145)

## 2018-02-16 LAB — PROTIME-INR
INR: 1.75
PROTHROMBIN TIME: 20.3 s — AB (ref 11.4–15.2)

## 2018-02-16 LAB — GLUCOSE, CAPILLARY
GLUCOSE-CAPILLARY: 134 mg/dL — AB (ref 70–99)
Glucose-Capillary: 164 mg/dL — ABNORMAL HIGH (ref 70–99)
Glucose-Capillary: 189 mg/dL — ABNORMAL HIGH (ref 70–99)
Glucose-Capillary: 166 mg/dL — ABNORMAL HIGH (ref 70–99)

## 2018-02-16 MED ORDER — WARFARIN SODIUM 5 MG PO TABS
5.0000 mg | ORAL_TABLET | Freq: Once | ORAL | Status: AC
  Administered 2018-02-16: 5 mg via ORAL
  Filled 2018-02-16: qty 1

## 2018-02-16 MED ORDER — FERROUS SULFATE 325 (65 FE) MG PO TABS
325.0000 mg | ORAL_TABLET | Freq: Two times a day (BID) | ORAL | Status: DC
  Administered 2018-02-16: 325 mg via ORAL
  Filled 2018-02-16: qty 1

## 2018-02-16 NOTE — Progress Notes (Signed)
PROGRESS NOTE    [name]  IPJ:[id number] DOB: 28-Mar-1956 DOA: 01/27/2018 PCP: [name], MD   Brief Narrative: 62 year old man complicated PMH hospitalized September for sepsis secondary to right foot osteomyelitis, underwent BKA, found to have MSSA bacteremia with splenic infarct and tricuspid valve endocarditis, discharged on Ancef.  Developed acute oliguria and presented to the emergency department where he was found to have acute kidney injury.  Seen by nephrology and infectious disease, antibiotics changed to daptomycin.  Developed acute respiratory failure necessitating transfer to ICU and critical care service.  Temporary dialysis catheter was placed and the patient was started on hemodialysis.  Respiratory status improved and he was transferred back out to the hospital service.   Assessment & Plan:   Principal Problem:   Acute renal failure (ARF) (HCC) Active Problems:   Insulin-requiring or dependent type II diabetes mellitus ([place])   Essential hypertension   CAD (coronary artery disease)   Leukocytosis   Thrombocytopenia (HCC)   S/P BKA (below knee amputation) unilateral, right (HCC)   Endocarditis of tricuspid valve   Hyponatremia   PAF (paroxysmal atrial fibrillation) (HCC)   Aortic atherosclerosis (HCC)   Acute hypoxemic respiratory failure (HCC)   AKI, oliguric; unclear etiology. Could have been from ATN Vs AIN from ancef. Ct negative.  Continue to require HD>  Had tunneled catheter placement, 10-10 Nephrology following.  Had HD 10-12, plan for HD tomorrow.   PAF;  New diagnosis.  Resume coumadin. INR 1.75 Continue with metorpolol. Follow-up with Dr. Percival Spanish 3-4 weeks after hospitalization.  Acute hypoxic respiratory failure; pulmonary edema. Resolved.   MSSA Bacteremia , tricuspid valve endocarditis; splenic infarct diagnosed 9-17.  On Daptomycin per infectious diseases.  ESR elevated. Needs follow up   DM type 2; On lantus and SSI.     PVD s/p Bilateral BKA.  Dr Sarajane Jews spoke with Dr Sharol Given regarding staple removal.   Oral candidiasis Treated with fluconazole  Thrombocytopenia, stable.  Seen September 2019 as well.    Anemia of critical illness, iron deficiency.  Start oral iron supplement.  Monitor hb.   Obesity Continue management per dietician  Loss of cortex along the anterior inferior aspect of the T12 vertebral body as well as lucency in the posterior aspect of the L1 vertebral body. Question a degree of discitis/osteomyelitis in this region. This finding may warrant MR pre and post-contrast to further evaluate with respect to discitis/bony destruction. --Case was discussed with Dr. [name].  No change to therapy recommended.  Continue daptomycin.  Diarrhea; check stool culture.  Lactose free diet  Improved.   DVT prophylaxis: coumadin on hold for procedure.  Code Status: full code.  Family Communication: family at bedside.  Disposition Plan: insurance decline LTAC will try to do Peer to peer on Monday.   Consultants:   ID  Nephrology    Procedures:   Antimicrobials: Daptomycin.   Subjective: He is feeling well, diarrhea improved   Objective: Vitals:   02/15/18 1658 02/15/18 2049 02/16/18 0529 02/16/18 1000  BP: 124/62 133/77 (!) 143/71 129/65  Pulse: 86 90 79 86  Resp: '18 18 18 18  ' Temp: 98.4 F (36.9 C) 98.6 F (37 C) 98 F (36.7 C) 97.6 F (36.4 C)  TempSrc: Oral Oral Oral Oral  SpO2: 96% 96% 97% 93%  Weight:      Height:        Intake/Output Summary (Last 24 hours) at 02/16/2018 1204 Last data filed at 02/16/2018 0945 Gross per 24 hour  Intake 1014  ml  Output 600 ml  Net 414 ml   Filed Weights   02/12/18 2034 02/14/18 0950 02/14/18 1401  Weight: 113 kg 114 kg 111.1 kg    Examination:  General exam: NAD Respiratory system; CTA Cardiovascular system: S 1, S 2 RRR Gastrointestinal system: BS present, sot, nt Central nervous system; Alert.  Extremities:  Bilateral AKA Skin: no rashes.     Data Reviewed: I have personally reviewed following labs and imaging studies  CBC: Recent Labs  Lab 02/10/18 0714 02/13/18 0400 02/14/18 0406  WBC 13.0* 8.8 9.8  HGB 8.7* 8.1* 8.2*  HCT 27.8* 26.3* 26.3*  MCV 89.4 90.4 90.4  PLT 140* 174 037   Basic Metabolic Panel: Recent Labs  Lab 02/12/18 0342 02/13/18 0400 02/14/18 0406 02/15/18 0402 02/16/18 0414  NA 133* 134* 134* 136 135  K 4.2 3.7 3.7 3.9 4.0  CL 98 98 97* 99 100  CO2 '24 26 26 27 26  ' GLUCOSE 133* 137* 107* 110* 208*  BUN 61* 36* 55* 26* 42*  CREATININE 5.81* 4.22* 5.19* 3.65* 4.66*  CALCIUM 7.0* 7.2* 7.0* 7.2* 7.2*  PHOS 6.0* 5.0* 5.6* 3.8 4.4   GFR: Estimated Creatinine Clearance: 20.8 mL/min (A) (by C-G formula based on SCr of 4.66 mg/dL (H)). Liver Function Tests: Recent Labs  Lab 02/12/18 0342 02/13/18 0400 02/14/18 0406 02/15/18 0402 02/16/18 0414  ALBUMIN 2.0* 2.0* 2.0* 2.0* 2.0*   No results for input(s): LIPASE, AMYLASE in the last 168 hours. No results for input(s): AMMONIA in the last 168 hours. Coagulation Profile: Recent Labs  Lab 02/12/18 0342 02/13/18 0400 02/14/18 0406 02/15/18 0402 02/16/18 0414  INR 1.43 1.37 1.39 1.44 1.75   Cardiac Enzymes: Recent Labs  Lab 02/13/18 0400  CKTOTAL 64   BNP (last 3 results) No results for input(s): PROBNP in the last 8760 hours. HbA1C: No results for input(s): HGBA1C in the last 72 hours. CBG: Recent Labs  Lab 02/15/18 1213 02/15/18 1658 02/15/18 2049 02/16/18 0758 02/16/18 1118  GLUCAP 146* 189* 215* 164* 189*   Lipid Profile: No results for input(s): CHOL, HDL, LDLCALC, TRIG, CHOLHDL, LDLDIRECT in the last 72 hours. Thyroid Function Tests: No results for input(s): TSH, T4TOTAL, FREET4, T3FREE, THYROIDAB in the last 72 hours. Anemia Panel: Recent Labs    02/15/18 1322  FERRITIN 376*  TIBC 171*  IRON 21*   Sepsis Labs: No results for input(s): PROCALCITON, LATICACIDVEN in the last  168 hours.  No results found for this or any previous visit (from the past 240 hour(s)).       Radiology Studies: No results found.      Scheduled Meds: . Chlorhexidine Gluconate Cloth  6 each Topical Q0600  . darbepoetin (ARANESP) injection - DIALYSIS  60 mcg Intravenous Q Fri-HD  . feeding supplement (NEPRO CARB STEADY)  237 mL Oral BID BM  . feeding supplement (PRO-STAT SUGAR FREE 64)  30 mL Oral Daily  . Gerhardt's butt cream   Topical TID  . insulin aspart  0-5 Units Subcutaneous QHS  . insulin aspart  0-9 Units Subcutaneous TID WC  . insulin aspart  4 Units Subcutaneous TID WC  . insulin glargine  22 Units Subcutaneous QHS  . latanoprost  1 drop Both Eyes QHS  . mouth rinse  15 mL Mouth Rinse BID  . metoprolol tartrate  75 mg Oral BID  . multivitamin  1 tablet Oral QHS  . nystatin   Topical BID  . pantoprazole  40 mg Oral Q1200  .  traZODone  50 mg Oral QHS  . warfarin  5 mg Oral ONCE-1800  . Warfarin - Pharmacist Dosing Inpatient   Does not apply q1800   Continuous Infusions: . sodium chloride 250 mL (02/13/18 2020)  . DAPTOmycin (CUBICIN)  IV 700 mg (02/15/18 2157)     LOS: 20 days    Time spent: 35 minutes.     Elmarie Shiley, MD [facility] Pager [phone]  If 7PM-7AM, please contact night-coverage [email or web address] Password [email or web address] 02/16/2018, 12:04 PM

## 2018-02-16 NOTE — Progress Notes (Signed)
[facility] ROUNDING NOTE   Subjective:    No new events  No significant urine output: 0.3L  Afebrile, blood pressure stable  Objective:  Vital signs in last 24 hours:  Temp:  [97.6 F (36.4 C)-98.6 F (37 C)] 97.6 F (36.4 C) (10/13 1000) Pulse Rate:  [79-90] 86 (10/13 1000) Resp:  [18] 18 (10/13 1000) BP: (124-143)/(62-77) 129/65 (10/13 1000) SpO2:  [93 %-97 %] 93 % (10/13 1000)  Weight change:  Filed Weights   02/12/18 2034 02/14/18 0950 02/14/18 1401  Weight: 113 kg 114 kg 111.1 kg    Intake/Output: I/O last 3 completed shifts: In: 7902 [P.O.:820; I.V.:110; IV Piggyback:114] Out: 300 [Urine:300]   Intake/Output this shift:  Total I/O In: 220 [P.O.:220] Out: 300 [IOXBD:532]  CVS- RRR systolic murmur RS-  Normal WOB and clear anteriorly ABD-soft, mod distented abdomen, +BS EXT-generalized edema trace  Access - RIJ Tunneled HD cath   Basic Metabolic Panel: Recent Labs  Lab 02/12/18 0342 02/13/18 0400 02/14/18 0406 02/15/18 0402 02/16/18 0414  NA 133* 134* 134* 136 135  K 4.2 3.7 3.7 3.9 4.0  CL 98 98 97* 99 100  CO2 24 26 26 27 26   GLUCOSE 133* 137* 107* 110* 208*  BUN 61* 36* 55* 26* 42*  CREATININE 5.81* 4.22* 5.19* 3.65* 4.66*  CALCIUM 7.0* 7.2* 7.0* 7.2* 7.2*  PHOS 6.0* 5.0* 5.6* 3.8 4.4    Liver Function Tests: Recent Labs  Lab 02/12/18 0342 02/13/18 0400 02/14/18 0406 02/15/18 0402 02/16/18 0414  ALBUMIN 2.0* 2.0* 2.0* 2.0* 2.0*   No results for input(s): LIPASE, AMYLASE in the last 168 hours. No results for input(s): AMMONIA in the last 168 hours.  CBC: Recent Labs  Lab 02/10/18 0714 02/13/18 0400 02/14/18 0406  WBC 13.0* 8.8 9.8  HGB 8.7* 8.1* 8.2*  HCT 27.8* 26.3* 26.3*  MCV 89.4 90.4 90.4  PLT 140* 174 204    Cardiac Enzymes: Recent Labs  Lab 02/13/18 0400  CKTOTAL 64    BNP: Invalid input(s): POCBNP  CBG: Recent Labs  Lab 02/15/18 1213 02/15/18 1658 02/15/18 2049 02/16/18 0758  02/16/18 1118  GLUCAP 146* 189* 215* 164* 189*    Microbiology: Results for orders placed or performed during the hospital encounter of 01/27/18  Urine culture     Status: None   Collection Time: 01/28/18  4:29 PM  Result Value Ref Range Status   Specimen Description URINE, CATHETERIZED  Final   Special Requests NONE  Final   Culture   Final    NO GROWTH Performed at [facility], [place] [address], [place], [place] [address]    Report Status 01/29/2018 FINAL  Final    Coagulation Studies: Recent Labs    02/14/18 0406 02/15/18 0402 02/16/18 0414  LABPROT 17.0* 17.4* 20.3*  INR 1.39 1.44 1.75    Urinalysis: No results for input(s): COLORURINE, LABSPEC, PHURINE, GLUCOSEU, HGBUR, BILIRUBINUR, KETONESUR, PROTEINUR, UROBILINOGEN, NITRITE, LEUKOCYTESUR in the last 72 hours.  Invalid input(s): APPERANCEUR    Imaging: No results found.   Medications:   . sodium chloride 250 mL (02/13/18 2020)  . DAPTOmycin (CUBICIN)  IV 700 mg (02/15/18 2157)   . Chlorhexidine Gluconate Cloth  6 each Topical Q0600  . darbepoetin (ARANESP) injection - DIALYSIS  60 mcg Intravenous Q Fri-HD  . feeding supplement (NEPRO CARB STEADY)  237 mL Oral BID BM  . feeding supplement (PRO-STAT SUGAR FREE 64)  30 mL Oral Daily  . Gerhardt's butt cream   Topical  TID  . insulin aspart  0-5 Units Subcutaneous QHS  . insulin aspart  0-9 Units Subcutaneous TID WC  . insulin aspart  4 Units Subcutaneous TID WC  . insulin glargine  22 Units Subcutaneous QHS  . latanoprost  1 drop Both Eyes QHS  . mouth rinse  15 mL Mouth Rinse BID  . metoprolol tartrate  75 mg Oral BID  . multivitamin  1 tablet Oral QHS  . nystatin   Topical BID  . pantoprazole  40 mg Oral Q1200  . traZODone  50 mg Oral QHS  . warfarin  5 mg Oral ONCE-1800  . Warfarin - Pharmacist Dosing Inpatient   Does not apply q1800   sodium chloride, acetaminophen **OR** acetaminophen, clonazepam, ondansetron **OR** ondansetron (ZOFRAN) IV,  sodium chloride flush  Assessment/ Plan:    1.  AKI, severe, oliguric:    Cr 01/22/18 1.13 but 9/23 9.6.  He has no clear nephrotoxic events (did have low PO intake days prior to presentation) but working diagnosis is ATN versus AIN from ancef which has been discontinued.  Empiric hydration was not helpful. CT renal stone protocol unrevealing.  He initiated RRT for volume overload.  Doubt acute glomerular process.    Continue to provide supportive care and await renal recovery which is still possible.    Next HD on Monday 10/14: 2K, 4h, TDC, No heparin, 3L UF goal  Has tunneled dialysis catheter at the current time  2. Atrial fibrillation with RVR:  Currently in NSR with normal BP.  On metoprolol 75 BID.   3.  Anemia: Hemoglobin stable in the 8s, on ESA.  Will check iron levels  4. 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 Algis Downs input for infectious disease started 01/30/2018 daptomycin 700 mg every 48 hours  5.  Diabetes mellitus per primary team     LOS: 20 [name] B @TODAY @11 :55 AM

## 2018-02-16 NOTE — Progress Notes (Signed)
[place] for warfarin  Indication: atrial fibrillation  Allergies  Allergen Reactions  . Ace Inhibitors Cough    Patient Measurements: Height: 5\' 10"  (177.8 cm) Weight: 244 lb 14.9 oz (111.1 kg) IBW/kg (Calculated) : 73   Vital Signs: Temp: 97.6 F (36.4 C) (10/13 1000) Temp Source: Oral (10/13 1000) BP: 129/65 (10/13 1000) Pulse Rate: 86 (10/13 1000)  Labs: Recent Labs    02/14/18 0406 02/15/18 0402 02/16/18 0414  HGB 8.2*  --   --   HCT 26.3*  --   --   PLT 204  --   --   LABPROT 17.0* 17.4* 20.3*  INR 1.39 1.44 1.75  CREATININE 5.19* 3.65* 4.66*    Estimated Creatinine Clearance: 20.8 mL/min (A) (by C-G formula based on SCr of 4.66 mg/dL (H)).   Medical History: Past Medical History:  Diagnosis Date  . Bicuspid aortic valve    a. 02/2009 s/p tissue AVR;  b. 07/2014 Echo: EF 55-60%, basal-mid inf HK, mild AS/MR, mod dil LA, PASP 44 mmHg.  Marland Kitchen CAD (coronary artery disease) October 2010   a. 02/2009 NSTEMI/Cath: 3VD; b. 02/2009 CABG x 5: LIMA->LAD, VG->Diag, VG->RI, VG->RVM->RPDA.  Marland Kitchen GERD (gastroesophageal reflux disease)   . Heart murmur   . Hyperlipidemia   . Hypertension   . Myocardial infarction ([place]) 2010  . PVD (peripheral vascular disease) ([place])    a. s/p toe amputations on L foot.  . Type II diabetes mellitus [facility])     Assessment: 62 yo M with new onset PAF on warfarin (started on 10/2).  -INR today = 1.44>>1.75.  He got 2 doses of vit k on 10/7 and 10/8.   Previously therapeutic on lower dose, but this as around the time of fluconazole therapy which may have affected the INR.   Goal of Therapy:  INR 2-3 Monitor platelets by anticoagulation protocol: Yes   Plan:  Coumadin 5mg  PO x1 Daily PT/INR  Lavine Hargrove A. Levada Dy, PharmD, [place] Pager: [phone] Please utilize Amion for appropriate phone number to reach the unit pharmacist ([place])   02/16/2018 10:11 AM

## 2018-02-17 LAB — RENAL FUNCTION PANEL
Albumin: 2.1 g/dL — ABNORMAL LOW (ref 3.5–5.0)
Anion gap: 8 (ref 5–15)
BUN: 56 mg/dL — AB (ref 8–23)
CO2: 26 mmol/L (ref 22–32)
CREATININE: 4.92 mg/dL — AB (ref 0.61–1.24)
Calcium: 7.3 mg/dL — ABNORMAL LOW (ref 8.9–10.3)
Chloride: 101 mmol/L (ref 98–111)
GFR calc Af Amer: 13 mL/min — ABNORMAL LOW (ref >60)
GFR, EST NON AFRICAN AMERICAN: 12 mL/min — AB (ref >60)
Glucose, Bld: 157 mg/dL — ABNORMAL HIGH (ref 70–99)
Phosphorus: 4.8 mg/dL — ABNORMAL HIGH (ref 2.5–4.6)
Potassium: 3.9 mmol/L (ref 3.5–5.1)
SODIUM: 135 mmol/L (ref 135–145)

## 2018-02-17 LAB — CBC
HCT: 26.5 % — ABNORMAL LOW (ref 39.0–52.0)
Hemoglobin: 8.3 g/dL — ABNORMAL LOW (ref 13.0–17.0)
MCH: 28.2 pg (ref 26.0–34.0)
MCHC: 31.3 g/dL (ref 30.0–36.0)
MCV: 90.1 fL (ref 80.0–100.0)
PLATELETS: 186 10*3/uL (ref 150–400)
RBC: 2.94 MIL/uL — AB (ref 4.22–5.81)
RDW: 13.5 % (ref 11.5–15.5)
WBC: 9.7 10*3/uL (ref 4.0–10.5)
nRBC: 0 % (ref 0.0–0.2)

## 2018-02-17 LAB — PROTIME-INR
INR: 1.98
PROTHROMBIN TIME: 22.3 s — AB (ref 11.4–15.2)

## 2018-02-17 LAB — GLUCOSE, CAPILLARY
GLUCOSE-CAPILLARY: 188 mg/dL — AB (ref 70–99)
Glucose-Capillary: 146 mg/dL — ABNORMAL HIGH (ref 70–99)
Glucose-Capillary: 177 mg/dL — ABNORMAL HIGH (ref 70–99)
Glucose-Capillary: 177 mg/dL — ABNORMAL HIGH (ref 70–99)

## 2018-02-17 MED ORDER — HEPARIN SODIUM (PORCINE) 1000 UNIT/ML IJ SOLN
INTRAMUSCULAR | Status: AC
  Filled 2018-02-17: qty 4

## 2018-02-17 MED ORDER — DARBEPOETIN ALFA 200 MCG/0.4ML IJ SOSY
200.0000 ug | PREFILLED_SYRINGE | INTRAMUSCULAR | Status: DC
  Filled 2018-02-17: qty 0.4

## 2018-02-17 MED ORDER — SODIUM CHLORIDE 0.9 % IV SOLN
510.0000 mg | INTRAVENOUS | Status: AC
  Administered 2018-02-17 – 2018-02-24 (×2): 510 mg via INTRAVENOUS
  Filled 2018-02-17 (×2): qty 17

## 2018-02-17 MED ORDER — WARFARIN SODIUM 5 MG PO TABS
5.0000 mg | ORAL_TABLET | Freq: Once | ORAL | Status: AC
  Administered 2018-02-17: 5 mg via ORAL
  Filled 2018-02-17: qty 1

## 2018-02-17 NOTE — Progress Notes (Signed)
PROGRESS NOTE    [name] Born  MRN:[id number] DOB: 04/07/1956 DOA: 01/27/2018 PCP: Strickland, James, MD   Brief Narrative: 62-year-old man complicated PMH hospitalized September for sepsis secondary to right foot osteomyelitis, underwent BKA, found to have MSSA bacteremia with splenic infarct and tricuspid valve endocarditis, discharged on Ancef.  Developed acute oliguria and presented to the emergency department where he was found to have acute kidney injury.  Seen by nephrology and infectious disease, antibiotics changed to daptomycin.  Developed acute respiratory failure necessitating transfer to ICU and critical care service.  Temporary dialysis catheter was placed and the patient was started on hemodialysis.  Respiratory status improved and he was transferred back out to the hospital service.   Assessment & Plan:   Principal Problem:   Acute renal failure (ARF) (HCC) Active Problems:   Insulin-requiring or dependent type II diabetes mellitus (HCC)   Essential hypertension   CAD (coronary artery disease)   Leukocytosis   Thrombocytopenia (HCC)   S/P BKA (below knee amputation) unilateral, right (HCC)   Endocarditis of tricuspid valve   Hyponatremia   PAF (paroxysmal atrial fibrillation) (HCC)   Aortic atherosclerosis (HCC)   Acute hypoxemic respiratory failure (HCC)   AKI, oliguric; unclear etiology. Could have been from ATN Vs AIN from ancef. Ct negative.  Continue to require HD>  Had tunneled catheter placement, 10-10 Nephrology following.  Had HD 10-12, 10/14 Discussed with nephrology, hope for recovery. Will monitor for recovery    PAF;  New diagnosis.  Resume coumadin. INR 1.98 Continue with metorpolol. Follow-up with Dr. Hochrein 3-4 weeks after hospitalization.  Acute hypoxic respiratory failure; pulmonary edema. Resolved.   MSSA Bacteremia , tricuspid valve endocarditis; splenic infarct diagnosed 9-17.  On Daptomycin per infectious diseases.  ESR  elevated. Needs follow up   DM type 2; On lantus and SSI.    PVD s/p Bilateral BKA.  Dr Goodrich spoke with Dr Duda regarding staple removal.   Oral candidiasis Treated with fluconazole  Thrombocytopenia, stable.  Seen September 2019 as well.    Anemia of critical illness, iron deficiency.  Start oral iron supplement.  Monitor hb.   Obesity Continue management per dietician  Loss of cortex along the anterior inferior aspect of the T12 vertebral body as well as lucency in the posterior aspect of the L1 vertebral body. Question a degree of discitis/osteomyelitis in this region. This finding may warrant MR pre and post-contrast to further evaluate with respect to discitis/bony destruction. --Case was discussed with Dr. Hatcher.  No change to therapy recommended.  Continue daptomycin.  Diarrhea; check stool culture.  Lactose free diet  Improved.   DVT prophylaxis: coumadin on hold for procedure.  Code Status: full code.  Family Communication: family at bedside.  Disposition Plan: insurance decline LTAC will try to do Peer to peer on Monday.   Consultants:   ID  Nephrology    Procedures:   Antimicrobials: Daptomycin.   Subjective: Denies abdominal pain,. Diarrhea is not worse.    Objective: Vitals:   02/17/18 1000 02/17/18 1030 02/17/18 1100 02/17/18 1130  BP: 137/77 (!) 147/84 (!) 153/73 (!) 149/80  Pulse: 80 82 (!) 47 82  Resp:      Temp:      TempSrc:      SpO2:      Weight:      Height:        Intake/Output Summary (Last 24 hours) at 02/17/2018 1200 Last data filed at 02/17/2018 0900 Gross per 24 hour  Intake   809.94 ml  Output 425 ml  Net 384.94 ml   Filed Weights   02/14/18 0950 02/14/18 1401 02/17/18 0947  Weight: 114 kg 111.1 kg 112 kg    Examination:  General exam: NAD Respiratory system; CTA Cardiovascular system: S 1, S 2 RRR Gastrointestinal system: BS present, soft, nt Central nervous system; Alert.  Extremities: Bilateral  BKA Skin: no rashes.     Data Reviewed: I have personally reviewed following labs and imaging studies  CBC: Recent Labs  Lab 02/13/18 0400 02/14/18 0406 02/17/18 0836  WBC 8.8 9.8 9.7  HGB 8.1* 8.2* 8.3*  HCT 26.3* 26.3* 26.5*  MCV 90.4 90.4 90.1  PLT 174 204 601   Basic Metabolic Panel: Recent Labs  Lab 02/13/18 0400 02/14/18 0406 02/15/18 0402 02/16/18 0414 02/17/18 0343  NA 134* 134* 136 135 135  K 3.7 3.7 3.9 4.0 3.9  CL 98 97* 99 100 101  CO2 _0 GLUCOSE 137* 107* 110* 208* 157*  BUN 36* 55* 26* 42* 56*  CREATININE 4.22* 5.19* 3.65* 4.66* 4.92*  CALCIUM 7.2* 7.0* 7.2* 7.2* 7.3*  PHOS 5.0* 5.6* 3.8 4.4 4.8*   GFR: Estimated Creatinine Clearance: 19.8 mL/min (A) (by C-G formula based on SCr of 4.92 mg/dL (H)). Liver Function Tests: Recent Labs  Lab 02/13/18 0400 02/14/18 0406 02/15/18 0402 02/16/18 0414 02/17/18 0343  ALBUMIN 2.0* 2.0* 2.0* 2.0* 2.1*   No results for input(s): LIPASE, AMYLASE in the last 168 hours. No results for input(s): AMMONIA in the last 168 hours. Coagulation Profile: Recent Labs  Lab 02/13/18 0400 02/14/18 0406 02/15/18 0402 02/16/18 0414 02/17/18 0343  INR 1.37 1.39 1.44 1.75 1.98   Cardiac Enzymes: Recent Labs  Lab 02/13/18 0400  CKTOTAL 64   BNP (last 3 results) No results for input(s): PROBNP in the last 8760 hours. HbA1C: No results for input(s): HGBA1C in the last 72 hours. CBG: Recent Labs  Lab 02/16/18 0758 02/16/18 1118 02/16/18 1724 02/16/18 2011 02/17/18 0821  GLUCAP 164* 189* 134* 166* 146*   Lipid Profile: No results for input(s): CHOL, HDL, LDLCALC, TRIG, CHOLHDL, LDLDIRECT in the last 72 hours. Thyroid Function Tests: No results for input(s): TSH, T4TOTAL, FREET4, T3FREE, THYROIDAB in the last 72 hours. Anemia Panel: Recent Labs    02/15/18 1322  FERRITIN 376*  TIBC 171*  IRON 21*   Sepsis Labs: No results for input(s): PROCALCITON, LATICACIDVEN in the last 168  hours.  No results found for this or any previous visit (from the past 240 hour(s)).       Radiology Studies: No results found.      Scheduled Meds: . Chlorhexidine Gluconate Cloth  6 each Topical Q0600  . [START ON 02/21/2018] darbepoetin (ARANESP) injection - DIALYSIS  200 mcg Intravenous Q Fri-HD  . feeding supplement (NEPRO CARB STEADY)  237 mL Oral BID BM  . feeding supplement (PRO-STAT SUGAR FREE 64)  30 mL Oral Daily  . Gerhardt's butt cream   Topical TID  . insulin aspart  0-5 Units Subcutaneous QHS  . insulin aspart  0-9 Units Subcutaneous TID WC  . insulin aspart  4 Units Subcutaneous TID WC  . insulin glargine  22 Units Subcutaneous QHS  . latanoprost  1 drop Both Eyes QHS  . mouth rinse  15 mL Mouth Rinse BID  . metoprolol tartrate  75 mg Oral BID  . multivitamin  1 tablet Oral QHS  . nystatin   Topical BID  . pantoprazole  40  mg Oral Q1200  . traZODone  50 mg Oral QHS  . warfarin  5 mg Oral ONCE-1800  . Warfarin - Pharmacist Dosing Inpatient   Does not apply q1800   Continuous Infusions: . sodium chloride 250 mL (02/16/18 1901)  . DAPTOmycin (CUBICIN)  IV 700 mg (02/15/18 2157)  . ferumoxytol       LOS: 21 days    Time spent: 35 minutes.     Belkys A Regalado, MD [facility] Pager [phone]  If 7PM-7AM, please contact night-coverage [email or web address] Password [email or web address] 02/17/2018, 12:00 PM  

## 2018-02-17 NOTE — Progress Notes (Signed)
Pharmacy Antibiotic Note  [name] is a 62 y.o. male  with MSSA bacteremia and TV endocarditis and noted with severe AKI, thought AIN 2/2 cefazolin. Pharmacy dosing daptomycin (9/25>>). Plan for HD today 10/14. -CK= 33>>239>>105>>64  Plan: Continue daptomycin 700 mg (~8mg /kg) IV q48h Weekly CK Monitor for renal recovery and HD  Height: 5\' 10"  (177.8 cm) Weight: 244 lb 14.9 oz (111.1 kg) IBW/kg (Calculated) : 73  Temp (24hrs), Avg:98.1 F (36.7 C), Min:97.6 F (36.4 C), Max:98.4 F (36.9 C)  Recent Labs  Lab 02/13/18 0400 02/14/18 0406 02/15/18 0402 02/16/18 0414 02/17/18 0343  WBC 8.8 9.8  --   --   --   CREATININE 4.22* 5.19* 3.65* 4.66* 4.92*    Estimated Creatinine Clearance: 19.7 mL/min (A) (by C-G formula based on SCr of 4.92 mg/dL (H)).    Allergies  Allergen Reactions  . Ace Inhibitors Cough     Thank you for allowing [place] to participate in this patients care.   Jens Som, PharmD Please utilize Amion (under [place]) for appropriate number for your unit pharmacist. 02/17/2018 7:32 AM

## 2018-02-17 NOTE — Progress Notes (Signed)
Subjective: Interval History: making more urine  Objective: Vital signs in last 24 hours: Temp:  [98.2 F (36.8 C)-98.4 F (36.9 C)] 98.2 F (36.8 C) (10/14 0947) Pulse Rate:  [47-86] 82 (10/14 1130) Resp:  [18-20] 18 (10/14 0947) BP: (121-154)/(63-84) 149/80 (10/14 1130) SpO2:  [97 %] 97 % (10/13 2010) Weight:  [253 kg] 112 kg (10/14 0947) Weight change:   Intake/Output from previous day: 10/13 0701 - 10/14 0700 In: 729.9 [P.O.:570; I.V.:159.9] Out: 425 [Urine:425] Intake/Output this shift: Total I/O In: 300 [P.O.:300] Out: 300 [Urine:300]  General appearance: alert, cooperative, moderately obese and pale Resp: diminished breath sounds bilaterally Chest wall: RIJ cath Cardio: S1, S2 normal and systolic murmur: systolic ejection 2/6, crescendo and decrescendo at 2nd left intercostal space GI: obese, pos bs, liver down 4 cm Extremities: Bilat BKAs  Lab Results: Recent Labs    02/17/18 0836  WBC 9.7  HGB 8.3*  HCT 26.5*  PLT 186   BMET:  Recent Labs    02/16/18 0414 02/17/18 0343  NA 135 135  K 4.0 3.9  CL 100 101  CO2 26 26  GLUCOSE 208* 157*  BUN 42* 56*  CREATININE 4.66* 4.92*  CALCIUM 7.2* 7.3*   No results for input(s): PTH in the last 72 hours. Iron Studies:  Recent Labs    02/15/18 1322  IRON 21*  TIBC 171*  FERRITIN 376*    Studies/Results: No results found.  I have reviewed the patient's current medications.  Assessment/Plan: 1 AKI now making more urine, rate of rise of Cr less. . Vol xs.AIN vs ATN 2 Anemia needs Fe,and ^ esa 3 Staph sepsis on Dapto 4 DM  5 PVD P HD, esa, Fe, AB, follow Cr closely   LOS: 21 days   Jeneen Rinks Chanae Gemma 02/17/2018,11:51 AM

## 2018-02-17 NOTE — Progress Notes (Signed)
PT Cancellation Note  Patient Details Name: Nina Hoar MRN: [id number] DOB: 19-Oct-1955   Cancelled Treatment:    Reason Eval/Treat Not Completed: Patient at procedure or test/unavailable. Pt currently off unit. Will continue to follow and initiate PT evaluation when able.    [name] 02/17/2018, 11:04 AM   [name], PT, DPT Acute Rehabilitation Services Pager: [phone] Office: [phone]

## 2018-02-17 NOTE — Clinical Social Work Note (Signed)
CSW advised that insurance denied [place] and peer-to-peer is to be held today. CSW will continue to follow and provide any needed SW intervention services as needed.  [name], MSW, LCSW Licensed Clinical Social Worker [place] [phone]

## 2018-02-17 NOTE — Procedures (Signed)
I was present at this session.  I have reviewed the session itself and made appropriate changes.HD via RIJ PC.  BP 110-140, tol well.   [name] 10/14/201911:51 AM

## 2018-02-17 NOTE — Progress Notes (Signed)
PT Cancellation Note  Patient Details Name: [name] MRN: [id number] DOB: 10/05/1955   Cancelled Treatment:    Reason Eval/Treat Not Completed: Patient at procedure or test/unavailable. Pt continues to be off unit at HD. Will check back as schedule allows for PT eval.    [name] 02/17/2018, 1:51 PM   [name], PT, DPT Acute Rehabilitation Services Pager: [phone] Office: [phone]

## 2018-02-17 NOTE — Progress Notes (Signed)
[place] for warfarin  Indication: atrial fibrillation  Allergies  Allergen Reactions  . Ace Inhibitors Cough    Patient Measurements: Height: 5\' 10"  (177.8 cm) Weight: 244 lb 14.9 oz (111.1 kg) IBW/kg (Calculated) : 73   Vital Signs: Temp: 98.2 F (36.8 C) (10/13 2010) Temp Source: Oral (10/13 2010) BP: 121/63 (10/13 2010) Pulse Rate: 86 (10/13 2010)  Labs: Recent Labs    02/15/18 0402 02/16/18 0414 02/17/18 0343  LABPROT 17.4* 20.3* 22.3*  INR 1.44 1.75 1.98  CREATININE 3.65* 4.66* 4.92*    Estimated Creatinine Clearance: 19.7 mL/min (A) (by C-G formula based on SCr of 4.92 mg/dL (H)).   Medical History: Past Medical History:  Diagnosis Date  . Bicuspid aortic valve    a. 02/2009 s/p tissue AVR;  b. 07/2014 Echo: EF 55-60%, basal-mid inf HK, mild AS/MR, mod dil LA, PASP 44 mmHg.  Marland Kitchen CAD (coronary artery disease) October 2010   a. 02/2009 NSTEMI/Cath: 3VD; b. 02/2009 CABG x 5: LIMA->LAD, VG->Diag, VG->RI, VG->RVM->RPDA.  Marland Kitchen GERD (gastroesophageal reflux disease)   . Heart murmur   . Hyperlipidemia   . Hypertension   . Myocardial infarction ([place]) 2010  . PVD (peripheral vascular disease) ([place])    a. s/p toe amputations on L foot.  . Type II diabetes mellitus ([place])     Assessment: 62 yo M with new onset PAF on warfarin (started on 10/1, held for [facility] placement and restarted 10/10).  -INR today = 1.98.  He got 2 doses of vit k on 10/7 and 10/8.   Previously therapeutic on lower dose, but this as around the time of fluconazole therapy which may have affected the INR. Unsure about PO intake, will f/u up with RN.  Goal of Therapy:  INR 2-3 Monitor platelets by anticoagulation protocol: Yes   Plan:  Give warfarin 5 mg po x 1 Monitor daily INR, CBC, clinical course, s/sx of bleed, PO intake, DDI    Thank you for allowing [place] to participate in this patients care.   Jens Som, PharmD Please utilize Amion (under [place]) for appropriate number for your unit pharmacist. 02/17/2018 7:35 AM

## 2018-02-18 LAB — GASTROINTESTINAL PANEL BY PCR, STOOL (REPLACES STOOL CULTURE)

## 2018-02-18 LAB — RENAL FUNCTION PANEL
ALBUMIN: 2.1 g/dL — AB (ref 3.5–5.0)
Anion gap: 9 (ref 5–15)
BUN: 30 mg/dL — ABNORMAL HIGH (ref 8–23)
CALCIUM: 7.6 mg/dL — AB (ref 8.9–10.3)
CO2: 28 mmol/L (ref 22–32)
Chloride: 99 mmol/L (ref 98–111)
Creatinine, Ser: 3.53 mg/dL — ABNORMAL HIGH (ref 0.61–1.24)
GFR calc Af Amer: 20 mL/min — ABNORMAL LOW (ref >60)
GFR, EST NON AFRICAN AMERICAN: 17 mL/min — AB (ref >60)
Glucose, Bld: 236 mg/dL — ABNORMAL HIGH (ref 70–99)
PHOSPHORUS: 3.7 mg/dL (ref 2.5–4.6)
Potassium: 4.2 mmol/L (ref 3.5–5.1)
Sodium: 136 mmol/L (ref 135–145)

## 2018-02-18 LAB — LACTOFERRIN, FECAL, QUALITATIVE: LACTOFERRIN, FECAL, QUAL: POSITIVE — AB

## 2018-02-18 LAB — PROTIME-INR
INR: 2.65
PROTHROMBIN TIME: 28 s — AB (ref 11.4–15.2)

## 2018-02-18 LAB — GLUCOSE, CAPILLARY
GLUCOSE-CAPILLARY: 230 mg/dL — AB (ref 70–99)
GLUCOSE-CAPILLARY: 218 mg/dL — AB (ref 70–99)
GLUCOSE-CAPILLARY: 124 mg/dL — AB (ref 70–99)
Glucose-Capillary: 255 mg/dL — ABNORMAL HIGH (ref 70–99)

## 2018-02-18 MED ORDER — WARFARIN SODIUM 2.5 MG PO TABS
2.5000 mg | ORAL_TABLET | Freq: Once | ORAL | Status: AC
  Administered 2018-02-18: 2.5 mg via ORAL
  Filled 2018-02-18: qty 1

## 2018-02-18 MED ORDER — METOPROLOL TARTRATE 50 MG PO TABS
50.0000 mg | ORAL_TABLET | Freq: Two times a day (BID) | ORAL | Status: DC
  Administered 2018-02-18 – 2018-03-11 (×42): 50 mg via ORAL
  Filled 2018-02-18 (×42): qty 1

## 2018-02-18 NOTE — Evaluation (Signed)
Physical Therapy Evaluation Patient Details Name: [name] MRN: [id number] DOB: 08/02/1955 Today's Date: 02/18/2018   History of Present Illness  Pt is a 62 y/o male with a complicated PMH hospitalized September for sepsis secondary to right foot osteomyelitis, underwent BKA, found to have MSSA bacteremia with splenic infarct and tricuspid valve endocarditis. He was discharged to SNF and developed acute oliguria - presented to the ED where he was found to have AKI.  Developed acute respiratory failure necessitating transfer to ICU and critical care service.  Temporary dialysis catheter was placed and the patient was started on HD.  Respiratory status improved and he was transferred back out to the hospital service.  Clinical Impression  Pt admitted with above diagnosis. Pt currently with functional limitations due to the deficits listed below (see PT Problem List). At the time of PT eval pt was able to perform bed>chair transfer utilizing anterior>posterior method with +2 mod assist. Pt was positioned with a geomat under hips and instructed in pressure relief techniques while sitting up in the chair. Asked unit to order an amputee sling for this patient for transfers when PT is not working with him. Noted that LTACH was not approved, and although feel this would be an appropriate option for the patient, recommending return to SNF level rehab at d/c. Acutely, pt will benefit from skilled PT to increase their independence and safety with mobility to allow discharge to the venue listed below.       Follow Up Recommendations SNF;Supervision/Assistance - 24 hour    Equipment Recommendations  None recommended by PT    Recommendations for Other Services       Precautions / Restrictions Precautions Precautions: Fall Restrictions Weight Bearing Restrictions: Yes RLE Weight Bearing: Non weight bearing LLE Weight Bearing: Weight bearing as tolerated Other Position/Activity Restrictions: Has  a prosthesis for LLE      Mobility  Bed Mobility Overal bed mobility: Needs Assistance Bed Mobility: Supine to Sit     Supine to sit: Mod assist;+2 for physical assistance;HOB elevated     General bed mobility comments: Assist with bed pad to transition into starting position for A>P transfer. Significant posterior assist provided to elevate trunk to full long-sitting position.   Transfers Overall transfer level: Needs assistance   Transfers: Comptroller transfers: Mod assist;+2 physical assistance;From elevated surface   General transfer comment: From slightly elevated surface so pt scooting "downhill". Pt was able to scoot himself backwards on the bed with bedpad assist into the recliner chair.   Ambulation/Gait             General Gait Details: Not assessed this session  Stairs            Wheelchair Mobility    Modified Rankin (Stroke Patients Only)       Balance Overall balance assessment: Needs assistance Sitting-balance support: Bilateral upper extremity supported;Single extremity supported Sitting balance-Leahy Scale: Fair     Standing balance support: Bilateral upper extremity supported Standing balance-Leahy Scale: Poor Standing balance comment: Pt with heavy reliance on UEs in standing.                               Pertinent Vitals/Pain Pain Assessment: 0-10 Pain Intervention(s): Monitored during session    Home Living Family/patient expects to be discharged to:: Skilled nursing facility Living Arrangements: Alone Available Help at Discharge: Family;Available PRN/intermittently Type of Home: Mobile  home Home Access: Ramped entrance     Home Layout: One level Home Equipment: [place] - 2 wheels;Wheelchair - manual;Cane - single point;Tub bench      Prior Function Level of Independence: Independent with assistive device(s)         Comments: has a L LE prosthesis; used a cane  PRN on uneven surfaces prior to RLE amputation     Hand Dominance   Dominant Hand: Right    Extremity/Trunk Assessment   Upper Extremity Assessment Upper Extremity Assessment: Overall WFL for tasks assessed    Lower Extremity Assessment Lower Extremity Assessment: RLE deficits/detail;LLE deficits/detail RLE Deficits / Details: BKA LLE Deficits / Details: BKA with prosthesis    Cervical / Trunk Assessment Cervical / Trunk Assessment: Normal  Communication   Communication: No difficulties  Cognition Arousal/Alertness: Awake/alert Behavior During Therapy: WFL for tasks assessed/performed Overall Cognitive Status: Within Functional Limits for tasks assessed                                        General Comments      Exercises Amputee Exercises Quad Sets: 10 reps;Both Chair Push Up: 5 reps   Assessment/Plan    PT Assessment Patient needs continued PT services  PT Problem List Decreased strength;Decreased range of motion;Decreased activity tolerance;Decreased balance;Decreased mobility;Decreased coordination;Decreased safety awareness;Decreased knowledge of use of DME;Decreased knowledge of precautions       PT Treatment Interventions DME instruction;Gait training;Stair training;Functional mobility training;Therapeutic activities;Therapeutic exercise;Balance training;Neuromuscular re-education;Patient/family education    PT Goals (Current goals can be found in the Care Plan section)  Acute Rehab PT Goals Patient Stated Goal: to get to rehab to be as independent as possible PT Goal Formulation: With patient Time For Goal Achievement: 03/04/18 Potential to Achieve Goals: Good    Frequency Min 2X/week   Barriers to discharge        Co-evaluation               AM-PAC PT "6 Clicks" Daily Activity  Outcome Measure Difficulty turning over in bed (including adjusting bedclothes, sheets and blankets)?: Unable Difficulty moving from lying on back  to sitting on the side of the bed? : Unable Difficulty sitting down on and standing up from a chair with arms (e.g., wheelchair, bedside commode, etc,.)?: Unable Help needed moving to and from a bed to chair (including a wheelchair)?: A Lot Help needed walking in hospital room?: Total Help needed climbing 3-5 steps with a railing? : Total 6 Click Score: 7    End of Session   Activity Tolerance: Patient tolerated treatment well Patient left: in chair;with call bell/phone within reach;with chair alarm set(Geomat under pt) Nurse Communication: Mobility status PT Visit Diagnosis: Other abnormalities of gait and mobility (R26.89)    Time: 9211-9417 PT Time Calculation (min) (ACUTE ONLY): 20 min   Charges:   PT Evaluation $PT Eval Moderate Complexity: [place], PT, DPT Acute Rehabilitation Services Pager: [phone] Office: [phone]   [name] 02/18/2018, 12:55 PM

## 2018-02-18 NOTE — Progress Notes (Signed)
Subjective: Interval History: has complaints thinks should be able to go to LTAC.  Objective: Vital signs in last 24 hours: Temp:  [98.2 F (36.8 C)-99.3 F (37.4 C)] 98.2 F (36.8 C) (10/15 0745) Pulse Rate:  [47-90] 71 (10/15 0745) Resp:  [16-18] 18 (10/15 0745) BP: (107-153)/(60-80) 131/66 (10/15 0745) SpO2:  [94 %-100 %] 98 % (10/15 0745) Weight:  [696 kg] 108 kg (10/14 1400) Weight change:   Intake/Output from previous day: 10/14 0701 - 10/15 0700 In: 899.6 [P.O.:660; I.V.:239.6] Out: 3600 [Urine:600] Intake/Output this shift: No intake/output data recorded.  General appearance: alert, cooperative, no distress, moderately obese and pale Resp: diminished breath sounds bilaterally Chest wall: RIJ cath Cardio: S1, S2 normal and systolic murmur: systolic ejection 2/6, crescendo and decrescendo at 2nd left intercostal space GI: soft, pos bs, mod distension Extremities: bilat BKAs  Lab Results: Recent Labs    02/17/18 0836  WBC 9.7  HGB 8.3*  HCT 26.5*  PLT 186   BMET:  Recent Labs    02/17/18 0343 02/18/18 0414  NA 135 136  K 3.9 4.2  CL 101 99  CO2 26 28  GLUCOSE 157* 236*  BUN 56* 30*  CREATININE 4.92* 3.53*  CALCIUM 7.3* 7.6*   No results for input(s): PTH in the last 72 hours. Iron Studies:  Recent Labs    02/15/18 1322  IRON 21*  TIBC 171*  FERRITIN 376*    Studies/Results: No results found.  I have reviewed the patient's current medications.  Assessment/Plan: 1 AKI nonoliguric, ??wgt up 14 kg. Some edema.  Hopefully will be regaining function. Suspected ATN, vs AIN from Ancef.  ? 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

## 2018-02-18 NOTE — Progress Notes (Signed)
Patient ID: [name], male   DOB: 1956-03-13, 62 y.o.   MRN: [id number] Patient is 4 weeks status post right transtibial amputation.  Examination the incision has healed nicely there is no redness no cellulitis no drainage there is good consolidation no signs of infection.  We will have staples removed today.  Anticipate prosthetic fitting once patient leaves the hospital.

## 2018-02-18 NOTE — Care Management Note (Signed)
Case Management Note  Patient Details  Name: [name] MRN: [id number] Date of Birth: 04/24/1956  Subjective/Objective:                    Action/Plan: Pt received denial from [facility] for [place]. MD to do an expedited appeal. Form signed by patient and sent to [facility] of choice). CM following.  Expected Discharge Date:  02/03/18               Expected Discharge Plan:  Long Term Acute Care (LTAC)  In-House Referral:  Clinical Social Work  Discharge planning Services  CM Consult  Post Acute Care Choice:    Choice offered to:     DME Arranged:    DME Agency:     HH Arranged:    [place] Agency:     Status of Service:  In process, will continue to follow  If discussed at Long Length of Stay Meetings, dates discussed:    Additional Comments:  Pollie Friar, RN 02/18/2018, 9:16 AM

## 2018-02-18 NOTE — Progress Notes (Addendum)
CSW spoke with patient at bedside. Patient was alert and fully oriented. CSW informed the patient that his insurance denied LTAC and MD not appealing decision. Patient is agreeable to returning back to [place]. Patient also states that he anticipates being approved for disability and medicare next possibly month. Patient inquired about past hospital bills he received during last admission. CSW explained he will need to contact the billing department.  [name], [place] Social Worker [phone]

## 2018-02-18 NOTE — Care Management Note (Signed)
Case Management Note  Patient Details  Name: [name] MRN: [id number] Date of Birth: June 21, 1955  Subjective/Objective:                    Action/Plan: Received information that MD is not going to do appeal for LTACH. Pt is from [place]. CM to update CSW for SNF rehab at d/c.   Expected Discharge Date:  02/03/18               Expected Discharge Plan:  Long Term Acute Care (LTAC)  In-House Referral:  Clinical Social Work  Discharge planning Services  CM Consult  Post Acute Care Choice:    Choice offered to:     DME Arranged:    DME Agency:     HH Arranged:    [place] Agency:     Status of Service:  In process, will continue to follow  If discussed at Long Length of Stay Meetings, dates discussed:    Additional Comments:  Pollie Friar, RN 02/18/2018, 9:17 AM

## 2018-02-18 NOTE — Progress Notes (Signed)
ANTICOAGULATION CONSULT NOTE - Follow Up Consult  Pharmacy Consult for Coumadin Indication: atrial fibrillation  Allergies  Allergen Reactions  . Ace Inhibitors Cough    Patient Measurements: Height: 5\' 10"  (177.8 cm) Weight: 238 lb 1.6 oz (108 kg) IBW/kg (Calculated) : 73  Vital Signs: Temp: 98.2 F (36.8 C) (10/15 0745) Temp Source: Oral (10/15 0745) BP: 131/66 (10/15 0745) Pulse Rate: 71 (10/15 0745)  Labs: Recent Labs    02/16/18 0414 02/17/18 0343 02/17/18 0836 02/18/18 0414  HGB  --   --  8.3*  --   HCT  --   --  26.5*  --   PLT  --   --  186  --   LABPROT 20.3* 22.3*  --  28.0*  INR 1.75 1.98  --  2.65  CREATININE 4.66* 4.92*  --  3.53*    Estimated Creatinine Clearance: 27 mL/min (A) (by C-G formula based on SCr of 3.53 mg/dL (H)).  Assessment:  62 yo M with new onset PAF on Coumadin. Coumadin started on 10/1, achieved therapeutic INR, then held for [facility] placement. Received Vitamin K 1 mg on 10/7 and 10/8.   Coumadin resumed on 02/13/18 after [facility] placement.       INR up to 2.65 after Coumadin 5 mg daily x 5 days.   Goal of Therapy:  INR 2-3 Monitor platelets by anticoagulation protocol: Yes   Plan:   Decrease Coumadin to 2.5 mg x 1 today.  Daily PT/INR.  Arty Baumgartner, [place] Pager: [phone] or phone: [phone] 02/18/2018,1:13 PM

## 2018-02-18 NOTE — Progress Notes (Signed)
PROGRESS NOTE    [name]  PPJ:[id number] DOB: 1956/03/21 DOA: 01/27/2018 PCP: [name], MD   Brief Narrative: 62 year old man complicated PMH hospitalized September for sepsis secondary to right foot osteomyelitis, underwent BKA, found to have MSSA bacteremia with splenic infarct and tricuspid valve endocarditis, discharged on Ancef.  Developed acute oliguria and presented to the emergency department where he was found to have acute kidney injury.  Seen by nephrology and infectious disease, antibiotics changed to daptomycin.  Developed acute respiratory failure necessitating transfer to ICU and critical care service.  Temporary dialysis catheter was placed and the patient was started on hemodialysis.  Respiratory status improved and he was transferred back out to the hospital service.  Patient has remain stable. At this time we are waiting for renal function to recover, or determine if patient will required outpatient HD>    Assessment & Plan:   Principal Problem:   Acute renal failure (ARF) ([place]) Active Problems:   Insulin-requiring or dependent type II diabetes mellitus ([place])   Essential hypertension   CAD (coronary artery disease)   Leukocytosis   Thrombocytopenia (HCC)   S/P BKA (below knee amputation) unilateral, right (HCC)   Endocarditis of tricuspid valve   Hyponatremia   PAF (paroxysmal atrial fibrillation) (HCC)   Aortic atherosclerosis ([place])   Acute hypoxemic respiratory failure (HCC)   AKI, oliguric; unclear etiology. Could have been from ATN Vs AIN from ancef. Ct negative.  Continue to require HD>  Had tunneled catheter placement, 10-10 Nephrology following.  Had HD 10-12, 10/14 Discussed with nephrology, hope for recovery. Will monitor for recovery    PAF;  New diagnosis.  Resume coumadin. INR 2.6 Continue with metorpolol. Follow-up with Dr. Percival Spanish 3-4 weeks after hospitalization.  Acute hypoxic respiratory failure; pulmonary edema.  Resolved.   MSSA Bacteremia , tricuspid valve endocarditis; splenic infarct diagnosed 9-17.  On Daptomycin per infectious diseases.  ESR elevated. Needs follow up   DM type 2; On lantus and SSI.    PVD s/p Bilateral BKA.  Appreciate Dr Sharol Given evaluation. Staples to be remove today   Oral candidiasis Treated with fluconazole  Thrombocytopenia, stable.  Seen September 2019 as well.    Anemia of critical illness, iron deficiency.  Started  oral iron supplement.  Monitor hb. Stable.   Obesity Continue management per dietician  Loss of cortex along the anterior inferior aspect of the T12 vertebral body as well as lucency in the posterior aspect of the L1 vertebral body. Question a degree of discitis/osteomyelitis in this region. This finding may warrant MR pre and post-contrast to further evaluate with respect to discitis/bony destruction. --Case was discussed with Dr. [name].  No change to therapy recommended.  Continue daptomycin.  Diarrhea; check stool culture.  Lactose free diet. Improved over weekend, report diarrhea today.  Check lactoferrin and  GI pathogen.   DVT prophylaxis: coumadin  Code Status: full code.  Family Communication: no family at bedside.  Disposition Plan: insurance decline LTAC  Consultants:   ID  Nephrology    Procedures:   Antimicrobials: Daptomycin.   Subjective: Report diarrhea today. Denies abdominal pain.    Objective: Vitals:   02/17/18 1648 02/17/18 2021 02/18/18 0538 02/18/18 0745  BP: 128/66 107/60 131/69 131/66  Pulse: 73 81 71 71  Resp: _0 Temp: 99.3 F (37.4 C) 98.5 F (36.9 C) 98.3 F (36.8 C) 98.2 F (36.8 C)  TempSrc: Oral Oral Oral Oral  SpO2: 94% 97% 100% 98%  Weight:  Height:        Intake/Output Summary (Last 24 hours) at 02/18/2018 1355 Last data filed at 02/18/2018 0945 Gross per 24 hour  Intake 899.61 ml  Output 3300 ml  Net -2400.39 ml   Filed Weights   02/14/18 1401 02/17/18 0947  02/17/18 1400  Weight: 111.1 kg 112 kg 108 kg    Examination:  General exam: NAD Respiratory system; CTA Cardiovascular system: S 1, S 2 RRR Gastrointestinal system: BS present, soft, nt Central nervous system;  Alert.  Extremities: Bilateral BKA Skin: no rashes.     Data Reviewed: I have personally reviewed following labs and imaging studies  CBC: Recent Labs  Lab 02/13/18 0400 02/14/18 0406 02/17/18 0836  WBC 8.8 9.8 9.7  HGB 8.1* 8.2* 8.3*  HCT 26.3* 26.3* 26.5*  MCV 90.4 90.4 90.1  PLT 174 204 224   Basic Metabolic Panel: Recent Labs  Lab 02/14/18 0406 02/15/18 0402 02/16/18 0414 02/17/18 0343 02/18/18 0414  NA 134* 136 135 135 136  K 3.7 3.9 4.0 3.9 4.2  CL 97* 99 100 101 99  CO2 _0 GLUCOSE 107* 110* 208* 157* 236*  BUN 55* 26* 42* 56* 30*  CREATININE 5.19* 3.65* 4.66* 4.92* 3.53*  CALCIUM 7.0* 7.2* 7.2* 7.3* 7.6*  PHOS 5.6* 3.8 4.4 4.8* 3.7   GFR: Estimated Creatinine Clearance: 27 mL/min (A) (by C-G formula based on SCr of 3.53 mg/dL (H)). Liver Function Tests: Recent Labs  Lab 02/14/18 0406 02/15/18 0402 02/16/18 0414 02/17/18 0343 02/18/18 0414  ALBUMIN 2.0* 2.0* 2.0* 2.1* 2.1*   No results for input(s): LIPASE, AMYLASE in the last 168 hours. No results for input(s): AMMONIA in the last 168 hours. Coagulation Profile: Recent Labs  Lab 02/14/18 0406 02/15/18 0402 02/16/18 0414 02/17/18 0343 02/18/18 0414  INR 1.39 1.44 1.75 1.98 2.65   Cardiac Enzymes: Recent Labs  Lab 02/13/18 0400  CKTOTAL 64   BNP (last 3 results) No results for input(s): PROBNP in the last 8760 hours. HbA1C: No results for input(s): HGBA1C in the last 72 hours. CBG: Recent Labs  Lab 02/17/18 1651 02/17/18 2132 02/17/18 2306 02/18/18 0744 02/18/18 1105  GLUCAP 177* 177* 188* 230* 218*   Lipid Profile: No results for input(s): CHOL, HDL, LDLCALC, TRIG, CHOLHDL, LDLDIRECT in the last 72 hours. Thyroid Function Tests: No results for  input(s): TSH, T4TOTAL, FREET4, T3FREE, THYROIDAB in the last 72 hours. Anemia Panel: No results for input(s): VITAMINB12, FOLATE, FERRITIN, TIBC, IRON, RETICCTPCT in the last 72 hours. Sepsis Labs: No results for input(s): PROCALCITON, LATICACIDVEN in the last 168 hours.  No results found for this or any previous visit (from the past 240 hour(s)).       Radiology Studies: No results found.      Scheduled Meds: . Chlorhexidine Gluconate Cloth  6 each Topical Q0600  . [START ON 02/21/2018] darbepoetin (ARANESP) injection - DIALYSIS  200 mcg Intravenous Q Fri-HD  . feeding supplement (NEPRO CARB STEADY)  237 mL Oral BID BM  . feeding supplement (PRO-STAT SUGAR FREE 64)  30 mL Oral Daily  . Gerhardt's butt cream   Topical TID  . insulin aspart  0-5 Units Subcutaneous QHS  . insulin aspart  0-9 Units Subcutaneous TID WC  . insulin aspart  4 Units Subcutaneous TID WC  . insulin glargine  22 Units Subcutaneous QHS  . latanoprost  1 drop Both Eyes QHS  . mouth rinse  15 mL Mouth Rinse BID  . metoprolol  tartrate  50 mg Oral BID  . multivitamin  1 tablet Oral QHS  . nystatin   Topical BID  . pantoprazole  40 mg Oral Q1200  . traZODone  50 mg Oral QHS  . warfarin  2.5 mg Oral ONCE-1800  . Warfarin - Pharmacist Dosing Inpatient   Does not apply q1800   Continuous Infusions: . sodium chloride 250 mL (02/16/18 1901)  . DAPTOmycin (CUBICIN)  IV Stopped (02/17/18 2241)  . ferumoxytol Stopped (02/17/18 1234)     LOS: 22 days    Time spent: 35 minutes.     Elmarie Shiley, MD [facility] Pager [phone]  If 7PM-7AM, please contact night-coverage [email or web address] Password [email or web address] 02/18/2018, 1:55 PM

## 2018-02-19 DIAGNOSIS — D72829 Elevated white blood cell count, unspecified: Secondary | ICD-10-CM

## 2018-02-19 LAB — URINALYSIS, ROUTINE W REFLEX MICROSCOPIC
Bilirubin Urine: NEGATIVE
GLUCOSE, UA: NEGATIVE mg/dL
Ketones, ur: NEGATIVE mg/dL
Nitrite: NEGATIVE
PROTEIN: 100 mg/dL — AB
RBC / HPF: >50 RBC/hpf — ABNORMAL HIGH (ref 0–5)
Specific Gravity, Urine: 1.013 (ref 1.005–1.030)
pH: 5 (ref 5.0–8.0)

## 2018-02-19 LAB — RENAL FUNCTION PANEL
Albumin: 2.1 g/dL — ABNORMAL LOW (ref 3.5–5.0)
Anion gap: 10 (ref 5–15)
BUN: 50 mg/dL — ABNORMAL HIGH (ref 8–23)
CALCIUM: 7.7 mg/dL — AB (ref 8.9–10.3)
CHLORIDE: 99 mmol/L (ref 98–111)
CO2: 26 mmol/L (ref 22–32)
Creatinine, Ser: 4.43 mg/dL — ABNORMAL HIGH (ref 0.61–1.24)
GFR calc non Af Amer: 13 mL/min — ABNORMAL LOW (ref >60)
GFR, EST AFRICAN AMERICAN: 15 mL/min — AB (ref >60)
GLUCOSE: 115 mg/dL — AB (ref 70–99)
POTASSIUM: 4 mmol/L (ref 3.5–5.1)
Phosphorus: 4.4 mg/dL (ref 2.5–4.6)
Sodium: 135 mmol/L (ref 135–145)

## 2018-02-19 LAB — GLUCOSE, CAPILLARY
GLUCOSE-CAPILLARY: 121 mg/dL — AB (ref 70–99)
GLUCOSE-CAPILLARY: 122 mg/dL — AB (ref 70–99)
Glucose-Capillary: 170 mg/dL — ABNORMAL HIGH (ref 70–99)
Glucose-Capillary: 139 mg/dL — ABNORMAL HIGH (ref 70–99)
Glucose-Capillary: 213 mg/dL — ABNORMAL HIGH (ref 70–99)

## 2018-02-19 LAB — PROTIME-INR
INR: 2.57
Prothrombin Time: 27.3 seconds — ABNORMAL HIGH (ref 11.4–15.2)

## 2018-02-19 LAB — PARATHYROID HORMONE, INTACT (NO CA): PTH: 126 pg/mL — ABNORMAL HIGH (ref 15–65)

## 2018-02-19 MED ORDER — SACCHAROMYCES BOULARDII 250 MG PO CAPS
250.0000 mg | ORAL_CAPSULE | Freq: Two times a day (BID) | ORAL | Status: DC
  Administered 2018-02-19 – 2018-03-11 (×40): 250 mg via ORAL
  Filled 2018-02-19 (×40): qty 1

## 2018-02-19 MED ORDER — WARFARIN SODIUM 2.5 MG PO TABS
2.5000 mg | ORAL_TABLET | Freq: Once | ORAL | Status: AC
  Administered 2018-02-19: 2.5 mg via ORAL
  Filled 2018-02-19: qty 1

## 2018-02-19 MED ORDER — PRO-STAT SUGAR FREE PO LIQD
30.0000 mL | Freq: Two times a day (BID) | ORAL | Status: DC
  Administered 2018-02-19 – 2018-02-24 (×7): 30 mL via ORAL
  Filled 2018-02-19 (×8): qty 30

## 2018-02-19 NOTE — Clinical Social Work Note (Signed)
Talked to patient and bed offers provided and Mr. Vacca gave CSW permission to call his niece, Ms. Cecil Vandyke [phone]) was contacted and advised of list given to patient. Niece expressed desire for patient to d/c to [facility] and this was discussed. CSW will continue to monitor patient's progress and assist with discharge to a skilled facility when medically stable.  Nishi Neiswonger Givens, MSW, LCSW Licensed Clinical Social Worker [place] [phone]

## 2018-02-19 NOTE — Progress Notes (Addendum)
PROGRESS NOTE  [name]  JOA:[id number] DOB: January 08, 1956 DOA: 01/27/2018 PCP: [name], MD   Brief Narrative: [name] is a 62 y.o. male with a history of sepsis due to right foot osteomyelitis s/p transtibial amputation and found to have MSSA bacteremia with splenic infarct and tricuspid valve endocarditis. He was discharged on ancef to [facility] but was quickly brought back in due to oliguria and found to have acute renal failure. He developed acute respiratory failure and transferred to ICU on CCM service. Antibiotics changed to daptomycin per ID and nephrology oversaw hemodialysis through right IJ temporary catheter on 10/12 and 10/14. With improvement in respiratory status he was transferred to the medical floor. Creatinine remains elevated though rate of rise decreasing and urine output increasing. Nephrology following closely for signs of renal recovery.  Assessment & Plan: Principal Problem:   Acute renal failure (ARF) (HCC) Active Problems:   Insulin-requiring or dependent type II diabetes mellitus ([place])   Essential hypertension   CAD (coronary artery disease)   Leukocytosis   Thrombocytopenia (HCC)   S/P BKA (below knee amputation) unilateral, right (HCC)   Endocarditis of tricuspid valve   Hyponatremia   PAF (paroxysmal atrial fibrillation) (HCC)   Aortic atherosclerosis (HCC)   Acute hypoxemic respiratory failure (HCC)  Oliguric acute renal failure: Due to ATN vs. ancef-induced AIN.  - Appreciate nephrology recommendations. Rate of rise in creatinine and pick up of urine output are hopeful. Will need to determine longterm renal function prior to discharge.   Paroxysmal atrial fibrillation: New diagnosis. Maintaining sinus rhythm. Cardiology consulted, signed off 10/3. - Continue coumadin - Continue metoprolol - Follow up with Dr. Percival Spanish in 3-4 weeks post discharge.  MSSA bacteremia and tricuspid valve endocarditis and splenic infarcts:  Stopped ancef.  - Continue daptomycin with weekly CK monitoring to complete 6 weeks Tx.   Diarrhea: Once daily, usually postprandial and not strictly watery diarrhea. No recent travel and GI pathogen panel negative. No indication to test for CDiff.  - Monitor output - Start probiotics  PVD s/p bilateral transtibial amputations: Reevaluated by Dr. Sharol Given during this hospitalization, staples removed from recent right TTA on 10/15.  - Follow up with Dr. Sharol Given and get prosthesis fitting after discharge.   Loss of cortex along anterior inferior aspect of T12 vertebral body, lucency in posterior aspect of L1 vertebral body: Uncertain etiology, could be infectious vs. other.  - Consider MRI w/ and w/o contrast after duration of antibiotics is completed. ID recommended no change in therapy.   Acute hypoxic respiratory failure: Due to pulmonary edema. Resolved.   Iron deficiency anemia and anemia of acute illness:  - Monitor and transfuse prn.  - Iron supplement  T2DM: A1c 8.5%.  - At inpatient goal on SSI, will continue  Thrombocytopenia: Stable  Oral candidiasis: Treated with fluconazole  Obesity: BMI 34.  - Dietitian consulted  DVT prophylaxis: Coumadin Code Status: Full Family Communication: None at bedside Disposition Plan: SNF, pt wishes for LTAC though this was declined by insurance and now requests bed search other than [facility]. Will need to either have satisfactory renal recovery or declaration of ESRD prior to discharge.  Consultants:   ID  IR  Cardiology  PCCM  Nephrology  Procedures:   Temporary HD catheter  HD  Antimicrobials:  Daptomycin    Subjective: No new complaints. No dyspnea, chest pain, palpitations. Has been making more urine. No diarrhea today. Denies fever, chills, abd pain, watery stools. 1 episode of loose stools  yesterday after breakfast.  Objective: Vitals:   02/18/18 0745 02/18/18 1653 02/18/18 2042 02/19/18 0444  BP: 131/66 (!) 116/57  125/66 135/62  Pulse: 71 82 70 85  Resp: 18 18 18 16   Temp: 98.2 F (36.8 C) 98.2 F (36.8 C) 98.3 F (36.8 C) 98.1 F (36.7 C)  TempSrc: Oral Oral Oral Oral  SpO2: 98% 98% 98% 94%  Weight:      Height:        Intake/Output Summary (Last 24 hours) at 02/19/2018 1317 Last data filed at 02/19/2018 0700 Gross per 24 hour  Intake 610 ml  Output 500 ml  Net 110 ml   Filed Weights   02/17/18 0947 02/17/18 1400 02/17/18 2021  Weight: 112 kg 108 kg 108 kg    Gen: 62 y.o. male in no distress Pulm: Non-labored breathing room air. Clear to auscultation bilaterally.  CV: Regular rate and rhythm. II/VI systolic murmur at LUSB, no rub, or gallop. No JVD GI: Abdomen soft, non-tender, non-distended, with normoactive bowel sounds. No organomegaly or masses felt. Ext: Warm, transtibial amputations (remotely on left with normal stump and recently on right with no discharge or significant tenderness noted). Skin: Right IJ PC without erythema, discharge, tenderness. As above, otherwise no rashes, lesions or ulcers Neuro: Alert and oriented. No focal neurological deficits. Psych: Judgement and insight appear normal. Mood & affect appropriate.   Data Reviewed: I have personally reviewed following labs and imaging studies  CBC: Recent Labs  Lab 02/13/18 0400 02/14/18 0406 02/17/18 0836  WBC 8.8 9.8 9.7  HGB 8.1* 8.2* 8.3*  HCT 26.3* 26.3* 26.5*  MCV 90.4 90.4 90.1  PLT 174 204 784   Basic Metabolic Panel: Recent Labs  Lab 02/15/18 0402 02/16/18 0414 02/17/18 0343 02/18/18 0414 02/19/18 0449  NA 136 135 135 136 135  K 3.9 4.0 3.9 4.2 4.0  CL 99 100 101 99 99  CO2 27 26 26 28 26   GLUCOSE 110* 208* 157* 236* 115*  BUN 26* 42* 56* 30* 50*  CREATININE 3.65* 4.66* 4.92* 3.53* 4.43*  CALCIUM 7.2* 7.2* 7.3* 7.6* 7.7*  PHOS 3.8 4.4 4.8* 3.7 4.4   GFR: Estimated Creatinine Clearance: 21.5 mL/min (A) (by C-G formula based on SCr of 4.43 mg/dL (H)). Liver Function Tests: Recent Labs   Lab 02/15/18 0402 02/16/18 0414 02/17/18 0343 02/18/18 0414 02/19/18 0449  ALBUMIN 2.0* 2.0* 2.1* 2.1* 2.1*   No results for input(s): LIPASE, AMYLASE in the last 168 hours. No results for input(s): AMMONIA in the last 168 hours. Coagulation Profile: Recent Labs  Lab 02/15/18 0402 02/16/18 0414 02/17/18 0343 02/18/18 0414 02/19/18 0449  INR 1.44 1.75 1.98 2.65 2.57   Cardiac Enzymes: Recent Labs  Lab 02/13/18 0400  CKTOTAL 64   BNP (last 3 results) No results for input(s): PROBNP in the last 8760 hours. HbA1C: No results for input(s): HGBA1C in the last 72 hours. CBG: Recent Labs  Lab 02/18/18 1634 02/18/18 2042 02/19/18 0038 02/19/18 0806 02/19/18 1131  GLUCAP 255* 124* 122* 121* 170*   Lipid Profile: No results for input(s): CHOL, HDL, LDLCALC, TRIG, CHOLHDL, LDLDIRECT in the last 72 hours. Thyroid Function Tests: No results for input(s): TSH, T4TOTAL, FREET4, T3FREE, THYROIDAB in the last 72 hours. Anemia Panel: No results for input(s): VITAMINB12, FOLATE, FERRITIN, TIBC, IRON, RETICCTPCT in the last 72 hours. Urine analysis:    Component Value Date/Time   COLORURINE AMBER (A) 01/27/2018 1034   APPEARANCEUR TURBID (A) 01/27/2018 1034   LABSPEC 1.017 01/27/2018  [place] 6.0 01/27/2018 1034   GLUCOSEU NEGATIVE 01/27/2018 1034   HGBUR MODERATE (A) 01/27/2018 [place] 01/27/2018 [place] 01/27/2018 1034   PROTEINUR >=300 (A) 01/27/2018 1034   UROBILINOGEN 0.2 07/28/2014 0433   NITRITE NEGATIVE 01/27/2018 1034   LEUKOCYTESUR MODERATE (A) 01/27/2018 1034   Recent Results (from the past 240 hour(s))  Gastrointestinal Panel by PCR , Stool     Status: None   Collection Time: 02/18/18 12:18 PM  Result Value Ref Range Status   Campylobacter species NOT DETECTED NOT DETECTED Final   Plesimonas shigelloides NOT DETECTED NOT DETECTED Final   Salmonella species NOT DETECTED NOT DETECTED Final   Yersinia  enterocolitica NOT DETECTED NOT DETECTED Final   Vibrio species NOT DETECTED NOT DETECTED Final   Vibrio cholerae NOT DETECTED NOT DETECTED Final   Enteroaggregative E coli (EAEC) NOT DETECTED NOT DETECTED Final   Enteropathogenic E coli (EPEC) NOT DETECTED NOT DETECTED Final   Enterotoxigenic E coli (ETEC) NOT DETECTED NOT DETECTED Final   Shiga like toxin producing E coli (STEC) NOT DETECTED NOT DETECTED Final   Shigella/Enteroinvasive E coli (EIEC) NOT DETECTED NOT DETECTED Final   Cryptosporidium NOT DETECTED NOT DETECTED Final   Cyclospora cayetanensis NOT DETECTED NOT DETECTED Final   Entamoeba histolytica NOT DETECTED NOT DETECTED Final   Giardia lamblia NOT DETECTED NOT DETECTED Final   Adenovirus F40/41 NOT DETECTED NOT DETECTED Final   Astrovirus NOT DETECTED NOT DETECTED Final   Norovirus GI/GII NOT DETECTED NOT DETECTED Final   Rotavirus A NOT DETECTED NOT DETECTED Final   Sapovirus (I, II, IV, and V) NOT DETECTED NOT DETECTED Final    Comment: Performed at [facility], [address], [place], [place] [address]      Radiology Studies: No results found.  Scheduled Meds: . Chlorhexidine Gluconate Cloth  6 each Topical Q0600  . [START ON 02/21/2018] darbepoetin (ARANESP) injection - DIALYSIS  200 mcg Intravenous Q Fri-HD  . feeding supplement (PRO-STAT SUGAR FREE 64)  30 mL Oral BID BM  . insulin aspart  0-5 Units Subcutaneous QHS  . insulin aspart  0-9 Units Subcutaneous TID WC  . insulin aspart  4 Units Subcutaneous TID WC  . insulin glargine  22 Units Subcutaneous QHS  . latanoprost  1 drop Both Eyes QHS  . mouth rinse  15 mL Mouth Rinse BID  . metoprolol tartrate  50 mg Oral BID  . multivitamin  1 tablet Oral QHS  . nystatin   Topical BID  . pantoprazole  40 mg Oral Q1200  . traZODone  50 mg Oral QHS  . warfarin  2.5 mg Oral ONCE-1800  . Warfarin - Pharmacist Dosing Inpatient   Does not apply q1800   Continuous Infusions: . sodium chloride 250 mL  (02/16/18 1901)  . DAPTOmycin (CUBICIN)  IV Stopped (02/17/18 2241)  . ferumoxytol Stopped (02/17/18 1234)     LOS: 23 days   Time spent: 25 minutes.  Patrecia Pour, MD [facility] [email or web address] Password [email or web address] 02/19/2018, 1:17 PM

## 2018-02-19 NOTE — Progress Notes (Signed)
ANTICOAGULATION CONSULT NOTE - Follow Up Consult  Pharmacy Consult for Coumadin Indication: atrial fibrillation  Allergies  Allergen Reactions  . Ace Inhibitors Cough    Patient Measurements: Height: 5\' 10"  (177.8 cm) Weight: 238 lb 1.6 oz (108 kg) IBW/kg (Calculated) : 73  Vital Signs: Temp: 98.1 F (36.7 C) (10/16 0444) Temp Source: Oral (10/16 0444) BP: 135/62 (10/16 0444) Pulse Rate: 85 (10/16 0444)  Labs: Recent Labs    02/17/18 0343 02/17/18 0836 02/18/18 0414 02/19/18 0449  HGB  --  8.3*  --   --   HCT  --  26.5*  --   --   PLT  --  186  --   --   LABPROT 22.3*  --  28.0* 27.3*  INR 1.98  --  2.65 2.57  CREATININE 4.92*  --  3.53* 4.43*    Estimated Creatinine Clearance: 21.5 mL/min (A) (by C-G formula based on SCr of 4.43 mg/dL (H)).  Assessment:  62 yo M with new onset PAF on Coumadin. Coumadin started on 10/1, achieved therapeutic INR, then held for [facility] placement. Received Vitamin K 1 mg on 10/7 and 10/8.   Coumadin resumed on 02/13/18 after [facility] placement.    INR at 2.57    Goal of Therapy:  INR 2-3 Monitor platelets by anticoagulation protocol: Yes   Plan:  Coumadin to 2.5 mg x 1 today. Daily PT/INR.  Jabron Weese A. Levada Dy, PharmD, [place] Pager: [phone] Please utilize Amion for appropriate phone number to reach the unit pharmacist ([place])   02/19/2018,7:55 AM

## 2018-02-19 NOTE — Progress Notes (Signed)
Nutrition Follow-up  DOCUMENTATION CODES:   Obesity unspecified  INTERVENTION:   Discontinue Nepro Shake as pt does not like this   Renal MVI daily  Increase Prostat to 55ml po BID, each supplement provides 100 kcals and 15 grams protein.   Recommend vitamin C 250mg  po BID x 30 days   Consider liberal diet   NUTRITION DIAGNOSIS:   Increased nutrient needs related to wound healing, acute illness(AKI on HD) as evidenced by increased estimated needs.  Being addressed via supplements  GOAL:   Patient will meet greater than or equal to 90% of their needs  Progressing  MONITOR:   PO intake, Diet advancement, Skin, Labs, I & O's  REASON FOR ASSESSMENT:   LOS    ASSESSMENT:   [name] is a 62 yo male with PMH of insulin-dependent type 2 diabetes, HTN, CAD, PVD, right BKA 12/2014, left BKA 01/17/2018, endocarditis who is admitted for acute renal failure.   10/10 TDC placed  Pt doing well; eating 50-100% of meals and taking Prostat. Pt is refusing the majority of the Nepro as he does not like this. RD will increase Prostat to twice daily and discontinue the Nepro. Recommend vitamin C to encourage wound healing and replace losses from HD. Per chart, pt with 28lb weight gain since admit; pt is noted to have edema. Consider liberalizing diet as a renal diet is very restrictive.     Medications reviewed and include: darbepoetin, insulin, rena-vite, protonix, warfarin, daptomycin   Labs reviewed: K 4.0 wnl, BUN 50(H), creat 4.43(H), P 4.4 wnl Iron 21(L), TIBC 171, ferritin 376-10/12 TSAT- 12% Hgb 8.3(L), Hct 26.5(L)-10/14 iPTH- 126(H)- 10/15  Diet Order:   Diet Order            Diet renal with fluid restriction Fluid restriction: 1200 mL Fluid; Room service appropriate? Yes; Fluid consistency: Thin  Diet effective now             EDUCATION NEEDS:   Education needs have been addressed  Skin:  Skin Assessment: Reviewed RN Assessment(moisture associated skin damage  buttocks groin; right leg incision (amputation 9/13))  Last BM:  10/15- type 7  Height:   Ht Readings from Last 1 Encounters:  01/27/18 5\' 10"  (1.778 m)    Weight:   Wt Readings from Last 1 Encounters:  02/17/18 108 kg    Ideal Body Weight:  66.5 kg amputations taken into account  BMI:  Body mass index is 34.16 kg/m.  Estimated Nutritional Needs:   Kcal:  1800-2000 kcals   Protein:  90-100  Fluid:  UOP + 1000 mL  Koleen Distance MS, RD, LDN Pager #- [phone] Office#- [phone] After Hours Pager: [phone]

## 2018-02-19 NOTE — Progress Notes (Signed)
Subjective: Interval History: has no complaint .  Objective: Vital signs in last 24 hours: Temp:  [98.1 F (36.7 C)-98.3 F (36.8 C)] 98.1 F (36.7 C) (10/16 0444) Pulse Rate:  [70-85] 85 (10/16 0444) Resp:  [16-18] 16 (10/16 0444) BP: (116-135)/(57-66) 135/62 (10/16 0444) SpO2:  [94 %-98 %] 94 % (10/16 0444) Weight change:   Intake/Output from previous day: 10/15 0701 - 10/16 0700 In: 760 [P.O.:550; I.V.:210] Out: 600 [Urine:600] Intake/Output this shift: No intake/output data recorded.  General appearance: alert, cooperative, no distress, mildly obese and pale Resp: diminished breath sounds bilaterally Chest wall: RIJ PC Cardio: S1, S2 normal and systolic murmur: systolic ejection 2/6, crescendo and decrescendo at 2nd left intercostal space GI: soft, pos bs, liver down 5 cm Extremities: Bilat BKAs  Lab Results: Recent Labs    02/17/18 0836  WBC 9.7  HGB 8.3*  HCT 26.5*  PLT 186   BMET:  Recent Labs    02/18/18 0414 02/19/18 0449  NA 136 135  K 4.2 4.0  CL 99 99  CO2 28 26  GLUCOSE 236* 115*  BUN 30* 50*  CREATININE 3.53* 4.43*  CALCIUM 7.6* 7.7*   Recent Labs    02/18/18 1304  PTH 126*   Iron Studies: No results for input(s): IRON, TIBC, TRANSFERRIN, FERRITIN in the last 72 hours.  Studies/Results: No results found.  I have reviewed the patient's current medications.  Assessment/Plan: 1 AKI more urine, slowing rate of rise of Cr. Hold off HD, hopefully early recovery.  AIN vs ARN 2 Staph sepsis/TV endocarditis  On Dapto 3 DM controlled 4 Anemia esa/Fe 5 HPTH vit D 6 PVD 7 AVR P follow urine vol, chem, cont AB.    LOS: 23 days   Jeneen Rinks Arnold Kester 02/19/2018,9:27 AM

## 2018-02-19 NOTE — NC FL2 (Signed)
[place] LEVEL OF CARE SCREENING TOOL     IDENTIFICATION  Patient Name: [name] Birthdate: 1956/02/27 Sex: male Admission Date (Current Location): 01/27/2018  [facility] and [place] Number:  Herbalist and Address:  [facility] [place], [place] [address], [place], [place] [address]      Provider Number: [id number]  Attending Physician Name and Address:  Patrecia Pour, MD  Relative Name and Phone Number:  [name] - niece, [phone]    Current Level of Care: Hospital Recommended Level of Care: [place] Prior Approval Number:    Date Approved/Denied:   PASRR Number: [id number]  Discharge Plan: SNF    Current Diagnoses: Patient Active Problem List   Diagnosis Date Noted  . Acute hypoxemic respiratory failure ([place]) 02/06/2018  . PAF (paroxysmal atrial fibrillation) ([place]) 02/05/2018  . Aortic atherosclerosis ([place]) 02/05/2018  . Acute renal failure (ARF) ([place]) 01/27/2018  . Hyponatremia 01/27/2018  . 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 S/P bilateral BKA (below knee amputation) ([place])   . Post-operative pain   . Benign essential HTN   . Diabetes mellitus type 2 in obese ([place])   . PVD (peripheral vascular disease) ([place])   . History of prosthetic mitral valve   . Right foot infection 01/15/2018  . Sepsis ([place]) 01/15/2018  . Prosthetic valve endocarditis ([place])   . Staphylococcus aureus bacteremia   . 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 Acute hematogenous osteomyelitis, right ankle and foot ([place])   . Splenic infarct 01/14/2018  . Renal insufficiency 01/14/2018  . Severe sepsis ([place]) 01/14/2018  . Dupuytren's disease of palm of both hands 03/18/2017  . Cellulitis of left lower extremity 04/26/2016  . S/P BKA (below knee amputation) unilateral, right ([place]) 12/08/2014  . Bicuspid aortic valve   . Hyperlipidemia   . Hypertension   . Greater trochanter fracture ([place]) 07/28/2014   . Hip fracture, right ([place]) 07/28/2014  . HLD (hyperlipidemia) 07/28/2014  . GERD (gastroesophageal reflux disease) 07/28/2014  . Hip fracture ([place]) 07/28/2014  . Fall 07/28/2014  . Leukocytosis 07/28/2014  . Thrombocytopenia ([place]) 07/28/2014  . H/O aortic valve replacement 10/16/2011  . 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 PLEURAL EFFUSION 05/25/2009  . OPEN WOUND OF CHEST , COMPLICATED 10/28/7626  . PROSTHETIC VALVE - BIO/ ENDOPROSTHESIS 05/25/2009  . Insulin-requiring or dependent type II diabetes mellitus ([place]) 04/19/2009  . HYPERLIPIDEMIA 04/19/2009  . Essential hypertension 04/19/2009  . CAD (coronary artery disease) 04/19/2009  . Peripheral vascular disease ([place]) 04/19/2009  . OSTEOMYELITIS 04/19/2009  . BICUSPID AORTIC VALVE 04/19/2009    Orientation RESPIRATION BLADDER Height & Weight     Self, Time, Situation, Place  Normal Indwelling catheter Weight: 238 lb 1.6 oz (108 kg) Height:  5\' 10"  (177.8 cm)  BEHAVIORAL SYMPTOMS/MOOD NEUROLOGICAL BOWEL NUTRITION STATUS      Continent Diet(Renal with 1200 mL fluid restriction)  AMBULATORY STATUS COMMUNICATION OF NEEDS Skin   Total Care(Bilateral amputee) Verbally Other (Comment), Skin abrasions(Ecchymosis right/left arm, MASD saccrum treated with barrier cream, Skin tear left arm with foam dressing)                       Personal Care Assistance Level of Assistance  Bathing, Feeding, Dressing Bathing Assistance: Maximum assistance Feeding assistance: Independent Dressing Assistance: Maximum assistance     Functional Limitations Info  Sight, Hearing, Speech Sight Info: Adequate Hearing Info: Adequate Speech Info: Adequate  SPECIAL CARE FACTORS FREQUENCY  PT (By licensed PT)     PT Frequency: Evaluated 10/15 OT Frequency: 5x            Contractures Contractures Info: Not present    Additional Factors Info  Code Status, Allergies, Insulin Sliding Scale Code Status Info: Full Allergies Info:  Ace Inhibitors   Insulin Sliding Scale Info: 0-5 Units daily at bedtime; 0-9 Units 3X per day with meals       Current Medications (02/19/2018):  This is the current hospital active medication list Current Facility-Administered Medications  Medication Dose Route Frequency Provider Last Rate Last Dose  . 0.9 %  sodium chloride infusion   Intravenous PRN Samuella Cota, MD 10 mL/hr at 02/16/18 1901 250 mL at 02/16/18 1901  . acetaminophen (TYLENOL) tablet 650 mg  650 mg Oral Q6H PRN Samuella Cota, MD   650 mg at 02/09/18 2019   Or  . acetaminophen (TYLENOL) suppository 650 mg  650 mg Rectal Q6H PRN Samuella Cota, MD      . Chlorhexidine Gluconate Cloth 2 % PADS 6 each  6 each Topical Q0600 Samuella Cota, MD   6 each at 02/19/18 [phone]  . clonazePAM (KLONOPIN) disintegrating tablet 0.25 mg  0.25 mg Oral BID PRN Samuella Cota, MD   0.25 mg at 02/18/18 2203  . DAPTOmycin (CUBICIN) 700 mg in sodium chloride 0.9 % IVPB  700 mg Intravenous Q48H Regalado, Belkys A, MD   Stopped at 02/17/18 2241  . [START ON 02/21/2018] Darbepoetin Alfa (ARANESP) injection 200 mcg  200 mcg Intravenous Q Fri-HD Deterding, James, MD      . feeding supplement (NEPRO CARB STEADY) liquid 237 mL  237 mL Oral BID BM Regalado, Belkys A, MD   237 mL at 02/18/18 0941  . feeding supplement (PRO-STAT SUGAR FREE 64) liquid 30 mL  30 mL Oral Daily Samuella Cota, MD   30 mL at 02/19/18 0908  . ferumoxytol (FERAHEME) 510 mg in sodium chloride 0.9 % 100 mL IVPB  510 mg Intravenous Weekly Mauricia Area, MD   Stopped at 02/17/18 1234  . insulin aspart (novoLOG) injection 0-5 Units  0-5 Units Subcutaneous QHS Samuella Cota, MD   3 Units at 02/15/18 2204  . insulin aspart (novoLOG) injection 0-9 Units  0-9 Units Subcutaneous TID WC Samuella Cota, MD   1 Units at 02/19/18 0910  . insulin aspart (novoLOG) injection 4 Units  4 Units Subcutaneous TID WC Samuella Cota, MD   4 Units at 02/19/18 0908  .  insulin glargine (LANTUS) injection 22 Units  22 Units Subcutaneous QHS Samuella Cota, MD   22 Units at 02/18/18 2204  . latanoprost (XALATAN) 0.005 % ophthalmic solution 1 drop  1 drop Both Eyes QHS Samuella Cota, MD   1 drop at 02/18/18 2203  . MEDLINE mouth rinse  15 mL Mouth Rinse BID Samuella Cota, MD   15 mL at 02/19/18 4097  . metoprolol tartrate (LOPRESSOR) tablet 50 mg  50 mg Oral BID Mauricia Area, MD   50 mg at 02/19/18 0907  . multivitamin (RENA-VIT) tablet 1 tablet  1 tablet Oral QHS Regalado, Belkys A, MD   1 tablet at 02/18/18 2203  . nystatin (MYCOSTATIN/NYSTOP) topical powder   Topical BID Samuella Cota, MD      . ondansetron [facility]) tablet 4 mg  4 mg Oral Q6H PRN Samuella Cota, MD  Or  . ondansetron (ZOFRAN) injection 4 mg  4 mg Intravenous Q6H PRN Samuella Cota, MD      . pantoprazole (PROTONIX) EC tablet 40 mg  40 mg Oral Q1200 Samuella Cota, MD   40 mg at 02/18/18 1443  . sodium chloride flush (NS) 0.9 % injection 10-40 mL  10-40 mL Intracatheter PRN Samuella Cota, MD   10 mL at 02/19/18 0454  . traZODone (DESYREL) tablet 50 mg  50 mg Oral QHS Samuella Cota, MD   50 mg at 02/18/18 2204  . warfarin (COUMADIN) tablet 2.5 mg  2.5 mg Oral ONCE-1800 Joselyn Glassman A, RPH      . Warfarin - Pharmacist Dosing Inpatient   Does not apply q1800 Skeet Simmer, [facility]         Discharge Medications: Please see discharge summary for a list of discharge medications.  Relevant Imaging Results:  Relevant Lab Results:   Additional Information ss#[id number]; Patient was receiving HD for AKI and per nephrology 02/19/18 note: "AKI more urine, slowing rate of rise of Cr. Hold off HD, hopefully early recovery".  Sable Feil, LCSW

## 2018-02-19 NOTE — Progress Notes (Signed)
Infection Prevention  Received call from: Angela Nevin, RN   Unit: 5 MW Regarding: enteric or contact isolation? IP Recommendation: No isolation needed unless incontinent; GIP negative & MDRO from 06/06/10 resolved by IP 01/28/18; per nurse pt is not incontinent; no isolation neeeded

## 2018-02-20 LAB — RENAL FUNCTION PANEL
ANION GAP: 9 (ref 5–15)
Albumin: 2.2 g/dL — ABNORMAL LOW (ref 3.5–5.0)
BUN: 67 mg/dL — ABNORMAL HIGH (ref 8–23)
CHLORIDE: 103 mmol/L (ref 98–111)
CO2: 24 mmol/L (ref 22–32)
Calcium: 7.7 mg/dL — ABNORMAL LOW (ref 8.9–10.3)
Creatinine, Ser: 3.91 mg/dL — ABNORMAL HIGH (ref 0.61–1.24)
GFR calc Af Amer: 18 mL/min — ABNORMAL LOW (ref >60)
GFR calc non Af Amer: 15 mL/min — ABNORMAL LOW (ref >60)
Glucose, Bld: 138 mg/dL — ABNORMAL HIGH (ref 70–99)
PHOSPHORUS: 4.8 mg/dL — AB (ref 2.5–4.6)
POTASSIUM: 3.8 mmol/L (ref 3.5–5.1)
Sodium: 136 mmol/L (ref 135–145)

## 2018-02-20 LAB — GLUCOSE, CAPILLARY
GLUCOSE-CAPILLARY: 175 mg/dL — AB (ref 70–99)
GLUCOSE-CAPILLARY: 180 mg/dL — AB (ref 70–99)
Glucose-Capillary: 91 mg/dL (ref 70–99)
Glucose-Capillary: 151 mg/dL — ABNORMAL HIGH (ref 70–99)

## 2018-02-20 LAB — PROTIME-INR
INR: 2.87
Prothrombin Time: 29.7 seconds — ABNORMAL HIGH (ref 11.4–15.2)

## 2018-02-20 LAB — CK: Total CK: 429 U/L — ABNORMAL HIGH (ref 49–397)

## 2018-02-20 MED ORDER — WARFARIN SODIUM 2.5 MG PO TABS
2.5000 mg | ORAL_TABLET | Freq: Once | ORAL | Status: AC
  Administered 2018-02-20: 2.5 mg via ORAL
  Filled 2018-02-20: qty 1

## 2018-02-20 NOTE — Progress Notes (Signed)
ANTICOAGULATION + ANTIBIOTIC CONSULT NOTE - Follow Up Consult  Pharmacy Consult for Coumadin and Daptomycin Indication: atrial fibrillation and bacteremia  Allergies  Allergen Reactions  . Ace Inhibitors Cough    Patient Measurements: Height: 5\' 10"  (177.8 cm) Weight: 238 lb 1.6 oz (108 kg) IBW/kg (Calculated) : 73  Vital Signs: Temp: 98 F (36.7 C) (10/17 0740) Temp Source: Oral (10/17 0740) BP: 125/63 (10/17 1103) Pulse Rate: 87 (10/17 1103)  Labs: Recent Labs    02/18/18 0414 02/19/18 0449 02/20/18 0332  LABPROT 28.0* 27.3* 29.7*  INR 2.65 2.57 2.87  CREATININE 3.53* 4.43*  --   CKTOTAL  --   --  429*    Estimated Creatinine Clearance: 21.5 mL/min (A) (by C-G formula based on SCr of 4.43 mg/dL (H)).  Assessment:  62 yo M with new onset PAF on Coumadin. Coumadin started on 10/1, achieved therapeutic INR, then held for [facility] placement. Received Vitamin K 1 mg on 10/7 and 10/8.   Coumadin resumed on 02/13/18 after [facility] placement.       INR remains therapeutic (2.87), some upward trend. Has had Coumadin 5 mg daily x 5 days then 2.5 mg x 2 days.    Continues on Daptomycin for MSSA bacteremia and TV endocarditis. Changed from Cefazolin as possible cause of AKI / AIN.   Weekly CK done this am, up to 429 (normal range 49-397).  Last CK 64 on 10/10.  CKs have been variable since Daptomycin started on 01/29/18 (range 33-239).  Daptomycin should be held if symptoms of myopathy and CK >5 x upper limit of normal or 1000; or in asymptomatic patients with CK >10 x ULN or >2000.  No myopathy reported.  Goal of Therapy:  INR 2-3 Monitor platelets by anticoagulation protocol: Yes   Plan:   Coumadin to 2.5 mg again today.  Daily PT/INR.  CBC in am.  Continue Daptomycin 700 mg IV q48hrs. Next dose due 10/18 pm  Recheck CK on 10/19.  CK at least weekly while on Daptomcyin (Q Thursday).  Follow renal function, monitor for myopathy.  Arty Baumgartner, [place] Pager: [phone] or  phone: [phone] 02/20/2018,12:49 PM

## 2018-02-20 NOTE — Progress Notes (Signed)
Subjective: Interval History: has no complaint .  Objective: Vital signs in last 24 hours: Temp:  [98 F (36.7 C)-98.5 F (36.9 C)] 98 F (36.7 C) (10/17 0740) Pulse Rate:  [77-85] 81 (10/17 0740) Resp:  [16-18] 18 (10/17 0740) BP: (130-146)/(64-72) 144/68 (10/17 0740) SpO2:  [95 %-98 %] 95 % (10/17 0740) Weight change:   Intake/Output from previous day: 10/16 0701 - 10/17 0700 In: 1252.9 [P.O.:500; I.V.:59; IV Piggyback:693.8] Out: 900 [Urine:900] Intake/Output this shift: Total I/O In: -  Out: 150 [Urine:150]  General appearance: alert, cooperative and no distress Resp: diminished breath sounds bilaterally Chest wall: RIJ PC Cardio: S1, S2 normal and systolic murmur: systolic ejection 2/6, crescendo and decrescendo at 2nd left intercostal space GI: obese, pos bs, soft, liver down 5 cm Extremities: Bilat BKA, 2+ edema.  Lab Results: No results for input(s): WBC, HGB, HCT, PLT in the last 72 hours. BMET:  Recent Labs    02/18/18 0414 02/19/18 0449  NA 136 135  K 4.2 4.0  CL 99 99  CO2 28 26  GLUCOSE 236* 115*  BUN 30* 50*  CREATININE 3.53* 4.43*  CALCIUM 7.6* 7.7*   Recent Labs    02/18/18 1304  PTH 126*   Iron Studies: No results for input(s): IRON, TIBC, TRANSFERRIN, FERRITIN in the last 72 hours.  Studies/Results: No results found.  I have reviewed the patient's current medications.  Assessment/Plan: 1 AKI nonoliguric , Chem pending , vol xs mild. AIN vs ATN 2 Anemia stable 3 DM controlled 4 PVD 5 Endocarditis on Dapto 6 AVR P check chem, diet, meds. AB    LOS: 24 days   Darcia Lampi 02/20/2018,9:30 AM

## 2018-02-20 NOTE — Progress Notes (Signed)
PROGRESS NOTE  [name]  NIO:[id number] DOB: 05-10-1955 DOA: 01/27/2018 PCP: [name], MD   Brief Narrative: [name] is a 62 y.o. male with a history of sepsis due to right foot osteomyelitis s/p transtibial amputation and found to have MSSA bacteremia with splenic infarct and tricuspid valve endocarditis. He was discharged on ancef to [facility] but was quickly brought back in due to oliguria and found to have acute renal failure. He developed acute respiratory failure and transferred to ICU on CCM service. Antibiotics changed to daptomycin per ID and nephrology oversaw hemodialysis through right IJ temporary catheter on 10/12 and 10/14. With improvement in respiratory status he was transferred to the medical floor. Nephrology following closely for signs of renal recovery. Creatinine trending downward as of 10/17 with increasing urine output.   Assessment & Plan: Principal Problem:   Acute renal failure (ARF) (HCC) Active Problems:   Insulin-requiring or dependent type II diabetes mellitus ([place])   Essential hypertension   CAD (coronary artery disease)   Leukocytosis   Thrombocytopenia (HCC)   S/P BKA (below knee amputation) unilateral, right (HCC)   Endocarditis of tricuspid valve   Hyponatremia   PAF (paroxysmal atrial fibrillation) (HCC)   Aortic atherosclerosis (HCC)   Acute hypoxemic respiratory failure (HCC)  Oliguric acute renal failure: Due to ATN vs. ancef-induced AIN. Improving! - Appreciate nephrology recommendations. Cr down and UOP 900cc/24hrs which is significant improvement. Continue monitoring in AM, renally dose medications.   Paroxysmal atrial fibrillation: New diagnosis. Maintaining sinus rhythm. Cardiology consulted, signed off 10/3. - Continue coumadin, decreasing daily dose with therapeutic level slightly uptrending. - Continue metoprolol - Follow up with Dr. Percival Spanish in 3-4 weeks post discharge.  MSSA bacteremia and tricuspid  valve endocarditis and splenic infarcts: Stopped ancef.  - Continue daptomycin to complete 6 weeks Tx. Discussed CK elevation (modest) in the setting of no complaints of myopathy with ID, Dr. Baxter Flattery. Will continue daptomycin (next dose 10/18) and recheck CK in 2-3 days.   Diarrhea: Once daily, usually postprandial and not strictly watery diarrhea. No recent travel and GI pathogen panel negative. No indication to test for CDiff.  - Monitor output, seems to be improving.  - Plan to continue probiotics 2 weeks following conclusion of antibiotics.  PVD s/p bilateral transtibial amputations: Reevaluated by Dr. Sharol Given during this hospitalization, staples removed from recent right TTA on 10/15.  - Follow up with Dr. Sharol Given and get prosthesis fitting after discharge.   Loss of cortex along anterior inferior aspect of T12 vertebral body, lucency in posterior aspect of L1 vertebral body: Uncertain etiology, could be infectious vs. other.  - Consider MRI w/ and w/o contrast after duration of antibiotics is completed. ID recommended no change in therapy.   Acute hypoxic respiratory failure: Due to pulmonary edema. Resolved.   Iron deficiency anemia and anemia of acute illness:  - Monitor and transfuse prn.  - Iron supplement  T2DM: A1c 8.5%.  - At inpatient goal on SSI, will continue  Thrombocytopenia: Stable  Oral candidiasis: Treated with fluconazole  Obesity: BMI 34.  - Dietitian consulted  DVT prophylaxis: Coumadin Code Status: Full Family Communication: None at bedside Disposition Plan: SNF, pt wishes for LTAC though this was declined by insurance and now requests bed search other than [facility]. If creatinine continues improvement, may be stable for discharge in next 24-48 hours. CSW aware that patient is requesting placement alternative to [facility].   Consultants:   ID  IR  Cardiology  PCCM  Nephrology  Procedures:   Temporary HD catheter  HD  Antimicrobials:  Daptomycin     Subjective: Denies any new developments. No chest pain or dyspnea, no bleeding/bruising, and no muscle aches/weakness generally or focally. Had stool with formed components this AM around 4am (only one in past 24 hours).   Objective: Vitals:   02/19/18 2117 02/20/18 0545 02/20/18 0740 02/20/18 1103  BP: (!) 146/64 136/70 (!) 144/68 125/63  Pulse: 85 77 81 87  Resp: 18 16 18    Temp: 98.5 F (36.9 C) 98.3 F (36.8 C) 98 F (36.7 C)   TempSrc: Oral Oral Oral   SpO2: 97% 97% 95%   Weight:      Height:        Intake/Output Summary (Last 24 hours) at 02/20/2018 1518 Last data filed at 02/20/2018 1346 Gross per 24 hour  Intake 1752.85 ml  Output 900 ml  Net 852.85 ml   Filed Weights   02/17/18 0947 02/17/18 1400 02/17/18 2021  Weight: 112 kg 108 kg 108 kg   Gen: 62 y.o. male in no distress Pulm: Nonlabored breathing room air. Clear. CV: Regular rate and rhythm. II/VI SEM, no rub, or gallop. No JVD, trace dependent edema. GI: Abdomen soft, non-tender, non-distended, with normoactive bowel sounds.  Ext: Warm. Bilateral transtibial amputations with compression on recent wound on right, nontender without discharge.  Skin: No new rashes, lesions or ulcers on visualized skin. Right IJ nontender, no erythema or discharge. Neuro: Alert and oriented. No focal neurological deficits. Psych: Judgement and insight appear fair. Mood euthymic & affect congruent. Behavior is appropriate.    Data Reviewed: I have personally reviewed following labs and imaging studies  CBC: Recent Labs  Lab 02/14/18 0406 02/17/18 0836  WBC 9.8 9.7  HGB 8.2* 8.3*  HCT 26.3* 26.5*  MCV 90.4 90.1  PLT 204 502   Basic Metabolic Panel: Recent Labs  Lab 02/16/18 0414 02/17/18 0343 02/18/18 0414 02/19/18 0449 02/20/18 1327  NA 135 135 136 135 136  K 4.0 3.9 4.2 4.0 3.8  CL 100 101 99 99 103  CO2 26 26 28 26 24   GLUCOSE 208* 157* 236* 115* 138*  BUN 42* 56* 30* 50* 67*  CREATININE 4.66* 4.92*  3.53* 4.43* 3.91*  CALCIUM 7.2* 7.3* 7.6* 7.7* 7.7*  PHOS 4.4 4.8* 3.7 4.4 4.8*   GFR: Estimated Creatinine Clearance: 24.4 mL/min (A) (by C-G formula based on SCr of 3.91 mg/dL (H)). Liver Function Tests: Recent Labs  Lab 02/16/18 0414 02/17/18 0343 02/18/18 0414 02/19/18 0449 02/20/18 1327  ALBUMIN 2.0* 2.1* 2.1* 2.1* 2.2*   No results for input(s): LIPASE, AMYLASE in the last 168 hours. No results for input(s): AMMONIA in the last 168 hours. Coagulation Profile: Recent Labs  Lab 02/16/18 0414 02/17/18 0343 02/18/18 0414 02/19/18 0449 02/20/18 0332  INR 1.75 1.98 2.65 2.57 2.87   Cardiac Enzymes: Recent Labs  Lab 02/20/18 0332  CKTOTAL 429*   BNP (last 3 results) No results for input(s): PROBNP in the last 8760 hours. HbA1C: No results for input(s): HGBA1C in the last 72 hours. CBG: Recent Labs  Lab 02/19/18 1131 02/19/18 1634 02/19/18 2117 02/20/18 0740 02/20/18 1142  GLUCAP 170* 139* 213* 91 151*   Lipid Profile: No results for input(s): CHOL, HDL, LDLCALC, TRIG, CHOLHDL, LDLDIRECT in the last 72 hours. Thyroid Function Tests: No results for input(s): TSH, T4TOTAL, FREET4, T3FREE, THYROIDAB in the last 72 hours. Anemia Panel: No results for input(s): VITAMINB12, FOLATE, FERRITIN, TIBC, IRON, RETICCTPCT  in the last 72 hours. Urine analysis:    Component Value Date/Time   COLORURINE YELLOW 02/19/2018 1441   APPEARANCEUR CLOUDY (A) 02/19/2018 1441   LABSPEC 1.013 02/19/2018 1441   PHURINE 5.0 02/19/2018 1441   GLUCOSEU NEGATIVE 02/19/2018 1441   HGBUR LARGE (A) 02/19/2018 1441   BILIRUBINUR NEGATIVE 02/19/2018 1441   KETONESUR NEGATIVE 02/19/2018 1441   PROTEINUR 100 (A) 02/19/2018 1441   UROBILINOGEN 0.2 07/28/2014 0433   NITRITE NEGATIVE 02/19/2018 1441   LEUKOCYTESUR LARGE (A) 02/19/2018 1441   Recent Results (from the past 240 hour(s))  Gastrointestinal Panel by PCR , Stool     Status: None   Collection Time: 02/18/18 12:18 PM  Result  Value Ref Range Status   Campylobacter species NOT DETECTED NOT DETECTED Final   Plesimonas shigelloides NOT DETECTED NOT DETECTED Final   Salmonella species NOT DETECTED NOT DETECTED Final   Yersinia enterocolitica NOT DETECTED NOT DETECTED Final   Vibrio species NOT DETECTED NOT DETECTED Final   Vibrio cholerae NOT DETECTED NOT DETECTED Final   Enteroaggregative E coli (EAEC) NOT DETECTED NOT DETECTED Final   Enteropathogenic E coli (EPEC) NOT DETECTED NOT DETECTED Final   Enterotoxigenic E coli (ETEC) NOT DETECTED NOT DETECTED Final   Shiga like toxin producing E coli (STEC) NOT DETECTED NOT DETECTED Final   Shigella/Enteroinvasive E coli (EIEC) NOT DETECTED NOT DETECTED Final   Cryptosporidium NOT DETECTED NOT DETECTED Final   Cyclospora cayetanensis NOT DETECTED NOT DETECTED Final   Entamoeba histolytica NOT DETECTED NOT DETECTED Final   Giardia lamblia NOT DETECTED NOT DETECTED Final   Adenovirus F40/41 NOT DETECTED NOT DETECTED Final   Astrovirus NOT DETECTED NOT DETECTED Final   Norovirus GI/GII NOT DETECTED NOT DETECTED Final   Rotavirus A NOT DETECTED NOT DETECTED Final   Sapovirus (I, II, IV, and V) NOT DETECTED NOT DETECTED Final    Comment: Performed at [facility], [address], [place], [place] [address]      Radiology Studies: No results found.  Scheduled Meds: . Chlorhexidine Gluconate Cloth  6 each Topical Q0600  . [START ON 02/21/2018] darbepoetin (ARANESP) injection - DIALYSIS  200 mcg Intravenous Q Fri-HD  . feeding supplement (PRO-STAT SUGAR FREE 64)  30 mL Oral BID BM  . insulin aspart  0-5 Units Subcutaneous QHS  . insulin aspart  0-9 Units Subcutaneous TID WC  . insulin aspart  4 Units Subcutaneous TID WC  . insulin glargine  22 Units Subcutaneous QHS  . latanoprost  1 drop Both Eyes QHS  . mouth rinse  15 mL Mouth Rinse BID  . metoprolol tartrate  50 mg Oral BID  . multivitamin  1 tablet Oral QHS  . nystatin   Topical BID  . pantoprazole   40 mg Oral Q1200  . saccharomyces boulardii  250 mg Oral BID  . traZODone  50 mg Oral QHS  . warfarin  2.5 mg Oral ONCE-1800  . Warfarin - Pharmacist Dosing Inpatient   Does not apply q1800   Continuous Infusions: . sodium chloride Stopped (02/20/18 0530)  . DAPTOmycin (CUBICIN)  IV Stopped (02/20/18 0005)  . ferumoxytol Stopped (02/17/18 1234)     LOS: 24 days   Time spent: 25 minutes.  Patrecia Pour, MD [facility] [email or web address] Password [email or web address] 02/20/2018, 3:18 PM

## 2018-02-20 NOTE — Evaluation (Signed)
Occupational Therapy Evaluation Patient Details Name: [name] MRN: [id number] DOB: August 16, 1955 Today's Date: 02/20/2018    History of Present Illness Pt is a 62 y/o male with a complicated PMH hospitalized September for sepsis secondary to right foot osteomyelitis, underwent BKA, found to have MSSA bacteremia with splenic infarct and tricuspid valve endocarditis. He was discharged to SNF and developed acute oliguria - presented to the ED where he was found to have AKI.  Developed acute respiratory failure necessitating transfer to ICU and critical care service.  Temporary dialysis catheter was placed and the patient was started on HD.  Respiratory status improved and he was transferred back out to the hospital service.   Clinical Impression   Pt admitted with above. He demonstrates the below listed deficits and will benefit from continued OT to maximize safety and independence with BADLs.  Pt presents to OT with generalized weakness, impaired balance, decreased activity tolerancrance.    He currently requires min - mod A for ADLs at bed level.   He has been at [facility] since last admission.  Prior to that, he was living alone and was mod I with ADLs.   Recommend SNF level rehab at discharge.       Follow Up Recommendations  SNF;Supervision/Assistance - 24 hour    Equipment Recommendations  None recommended by OT    Recommendations for Other Services       Precautions / Restrictions Precautions Precautions: Fall Required Braces or Orthoses: Other Brace/Splint Other Brace/Splint: limb protector Restrictions Weight Bearing Restrictions: Yes RLE Weight Bearing: Non weight bearing LLE Weight Bearing: Non weight bearing Other Position/Activity Restrictions: Has a prosthesis for LLE      Mobility Bed Mobility Overal bed mobility: Needs Assistance Bed Mobility: Supine to Sit;Sit to Supine     Supine to sit: Mod assist Sit to supine: Min assist   General bed mobility  comments: assist to lift trunk, and assist to control descent   Transfers                      Balance Overall balance assessment: Needs assistance Sitting-balance support: No upper extremity supported Sitting balance-Leahy Scale: Fair Sitting balance - Comments: able to sit for up to 2-3 mins EOB without UE support.  Worked on reaching with each UE while EOB                                    ADL either performed or assessed with clinical judgement   ADL Overall ADL's : Needs assistance/impaired Eating/Feeding: Modified independent;Sitting   Grooming: Wash/dry hands;Wash/dry face;Oral care;Brushing hair;Set up;Sitting   Upper Body Bathing: Set up;Supervision/ safety;Sitting Upper Body Bathing Details (indicate cue type and reason): supported sitting  Lower Body Bathing: Moderate assistance;Bed level   Upper Body Dressing : Moderate assistance;Sitting Upper Body Dressing Details (indicate cue type and reason): assist to maintain sitting balance without bil. UE support  Lower Body Dressing: Moderate assistance;Bed level   Toilet Transfer: Moderate assistance;+2 for physical assistance;BSC Toilet Transfer Details (indicate cue type and reason): ant/post  Toileting- Clothing Manipulation and Hygiene: Maximal assistance;Sitting/lateral lean       Functional mobility during ADLs: Moderate assistance;+2 for physical assistance       Vision Patient Visual Report: No change from baseline       Perception     Praxis      Pertinent Vitals/Pain Pain Assessment: No/denies pain  Hand Dominance Right   Extremity/Trunk Assessment Upper Extremity Assessment Upper Extremity Assessment: Generalized weakness;RUE deficits/detail;LUE deficits/detail RUE Deficits / Details: Pt with intrinisc weakness bil. hands with clawing noted  RUE Coordination: decreased fine motor LUE Deficits / Details: Pt with intrinisc weakness bil. hands with clawing noted  LUE  Coordination: decreased fine motor   Lower Extremity Assessment Lower Extremity Assessment: Defer to PT evaluation   Cervical / Trunk Assessment Cervical / Trunk Assessment: Normal   Communication Communication Communication: No difficulties   Cognition Arousal/Alertness: Awake/alert Behavior During Therapy: WFL for tasks assessed/performed Overall Cognitive Status: Within Functional Limits for tasks assessed                                     General Comments       Exercises Exercises: Other exercises Other Exercises Other Exercises: Performed "chair push ups" while EOB x 15 reps with close min guard assist.  Worked on scooting up EOB with mod A - UEs fatigued    Shoulder Instructions      Home Living Family/patient expects to be discharged to:: Skilled nursing facility Living Arrangements: Alone Available Help at Discharge: Family;Available PRN/intermittently Type of Home: Mobile home Home Access: Ramped entrance     Home Layout: One level     Bathroom Shower/Tub: Teacher, early years/pre: Standard Bathroom Accessibility: No   Home Equipment: Environmental consultant - 2 wheels;Wheelchair - manual;Cane - single point;Tub bench          Prior Functioning/Environment Level of Independence: Independent with assistive device(s)        Comments: has a L LE prosthesis; used a cane PRN on uneven surfaces prior to RLE amputation        OT Problem List: Decreased strength;Decreased activity tolerance;Impaired balance (sitting and/or standing);Decreased knowledge of use of DME or AE      OT Treatment/Interventions: Self-care/ADL training;Therapeutic exercise;DME and/or AE instruction;Therapeutic activities;Patient/family education;Balance training    OT Goals(Current goals can be found in the care plan section) Acute Rehab OT Goals Patient Stated Goal: to get a Rt Prosthesis, be able to walk and take care of self  OT Goal Formulation: With patient Time  For Goal Achievement: 03/06/18 Potential to Achieve Goals: Good ADL Goals Pt Will Perform Upper Body Bathing: (P) with supervision Pt Will Perform Lower Body Bathing: (P) with supervision;bed level;sitting/lateral leans Pt Will Perform Upper Body Dressing: (P) with supervision;sitting Pt Will Perform Lower Body Dressing: (P) with supervision;sitting/lateral leans;bed level Pt Will Transfer to Toilet: (P) with min assist;anterior/posterior transfer;bedside commode Pt Will Perform Toileting - Clothing Manipulation and hygiene: (P) with supervision;sitting/lateral leans  OT Frequency: Min 2X/week   Barriers to D/C: Decreased caregiver support          Co-evaluation              AM-PAC PT "6 Clicks" Daily Activity     Outcome Measure Help from another person eating meals?: None Help from another person taking care of personal grooming?: A Little Help from another person toileting, which includes using toliet, bedpan, or urinal?: A Lot Help from another person bathing (including washing, rinsing, drying)?: A Lot Help from another person to put on and taking off regular upper body clothing?: A Lot Help from another person to put on and taking off regular lower body clothing?: A Lot 6 Click Score: 15   End of Session Nurse Communication: Mobility status  Activity Tolerance: Patient tolerated treatment well Patient left: in bed;with call bell/phone within reach  OT Visit Diagnosis: Muscle weakness (generalized) (M62.81)                Time: 1032-1100 OT Time Calculation (min): 28 min Charges:  OT General Charges $OT Visit: 1 Visit OT Evaluation $OT Eval Moderate Complexity: 1 Mod OT Treatments $Therapeutic Activity: 8-22 mins  Lucille Passy, OTR/L Acute Rehabilitation Services Pager [phone] Office [phone]   Lucille Passy M 02/20/2018, 11:15 AM

## 2018-02-21 LAB — RENAL FUNCTION PANEL
ALBUMIN: 2.2 g/dL — AB (ref 3.5–5.0)
Anion gap: 9 (ref 5–15)
BUN: 74 mg/dL — ABNORMAL HIGH (ref 8–23)
CO2: 23 mmol/L (ref 22–32)
Calcium: 7.8 mg/dL — ABNORMAL LOW (ref 8.9–10.3)
Chloride: 104 mmol/L (ref 98–111)
Creatinine, Ser: 3.45 mg/dL — ABNORMAL HIGH (ref 0.61–1.24)
GFR, EST AFRICAN AMERICAN: 21 mL/min — AB (ref >60)
GFR, EST NON AFRICAN AMERICAN: 18 mL/min — AB (ref >60)
Glucose, Bld: 179 mg/dL — ABNORMAL HIGH (ref 70–99)
PHOSPHORUS: 5.4 mg/dL — AB (ref 2.5–4.6)
Potassium: 3.9 mmol/L (ref 3.5–5.1)
Sodium: 136 mmol/L (ref 135–145)

## 2018-02-21 LAB — CBC
HEMATOCRIT: 28.2 % — AB (ref 39.0–52.0)
HEMOGLOBIN: 8.6 g/dL — AB (ref 13.0–17.0)
MCH: 27.7 pg (ref 26.0–34.0)
MCHC: 30.5 g/dL (ref 30.0–36.0)
MCV: 91 fL (ref 80.0–100.0)
NRBC: 0 % (ref 0.0–0.2)
Platelets: 229 10*3/uL (ref 150–400)
RBC: 3.1 MIL/uL — ABNORMAL LOW (ref 4.22–5.81)
RDW: 13.7 % (ref 11.5–15.5)
WBC: 9.8 10*3/uL (ref 4.0–10.5)

## 2018-02-21 LAB — GLUCOSE, CAPILLARY
GLUCOSE-CAPILLARY: 169 mg/dL — AB (ref 70–99)
GLUCOSE-CAPILLARY: 183 mg/dL — AB (ref 70–99)
GLUCOSE-CAPILLARY: 145 mg/dL — AB (ref 70–99)
Glucose-Capillary: 188 mg/dL — ABNORMAL HIGH (ref 70–99)

## 2018-02-21 LAB — PROTIME-INR
INR: 2.88
Prothrombin Time: 29.8 seconds — ABNORMAL HIGH (ref 11.4–15.2)

## 2018-02-21 MED ORDER — WARFARIN SODIUM 2.5 MG PO TABS
2.5000 mg | ORAL_TABLET | Freq: Every day | ORAL | Status: DC
  Administered 2018-02-21: 2.5 mg via ORAL
  Filled 2018-02-21: qty 1

## 2018-02-21 MED ORDER — DARBEPOETIN ALFA 200 MCG/0.4ML IJ SOSY
200.0000 ug | PREFILLED_SYRINGE | INTRAMUSCULAR | Status: DC
  Filled 2018-02-21 (×2): qty 0.4

## 2018-02-21 NOTE — Progress Notes (Signed)
PROGRESS NOTE  [name]  JOI:[id number] DOB: 10-06-1955 DOA: 01/27/2018 PCP: [name], MD   Brief Narrative: [name] is a 62 y.o. male with a history of sepsis due to right foot osteomyelitis s/p transtibial amputation and found to have MSSA bacteremia with splenic infarct and tricuspid valve endocarditis. He was discharged on ancef to [facility] but was quickly brought back in due to oliguria and found to have acute renal failure. He developed acute respiratory failure and transferred to ICU on CCM service. Antibiotics changed to daptomycin per ID and nephrology oversaw hemodialysis through right IJ temporary catheter on 10/12 and 10/14. With improvement in respiratory status he was transferred to the medical floor. Nephrology following closely for signs of renal recovery. Creatinine trending downward as of 10/17 with increasing urine output.   Assessment & Plan: Principal Problem:   Acute renal failure (ARF) (HCC) Active Problems:   Insulin-requiring or dependent type II diabetes mellitus ([place])   Essential hypertension   CAD (coronary artery disease)   Leukocytosis   Thrombocytopenia (HCC)   S/P BKA (below knee amputation) unilateral, right (HCC)   Endocarditis of tricuspid valve   Hyponatremia   PAF (paroxysmal atrial fibrillation) (HCC)   Aortic atherosclerosis (HCC)   Acute hypoxemic respiratory failure (HCC)  Oliguric acute renal failure: Due to ATN vs. ancef-induced AIN. Continues slow improvement, CrCl remains < 42ml/min. UOP up to 1300 past 24 hours.  - Continue daily monitoring. Per nephrology, if continues improvement can pull temporary catheter on 10/20 and would be eligible for discharge if stable.   Paroxysmal atrial fibrillation: New diagnosis. Maintaining sinus rhythm. Cardiology consulted, signed off 10/3. - Continue coumadin, therapeutic INR - Continue metoprolol - Follow up with Dr. Percival Spanish in 3-4 weeks post discharge.  MSSA  bacteremia and tricuspid valve endocarditis and splenic infarcts: Stopped ancef.  - Continue daptomycin to complete 6 weeks Tx.CK mildly elevated, no myopathy symptoms at this time, will recheck CK 10/20 or if symptoms develop.   Diarrhea: Improving, usually postprandial and not strictly watery diarrhea. No recent travel and GI pathogen panel negative. - No indication to test for CDiff.  - Continue probiotics 2 weeks following conclusion of antibiotics.  PVD s/p bilateral transtibial amputations: Reevaluated by Dr. Sharol Given during this hospitalization, staples removed from recent right TTA on 10/15.  - Follow up with Dr. Sharol Given and get prosthesis fitting after discharge.   Loss of cortex along anterior inferior aspect of T12 vertebral body, lucency in posterior aspect of L1 vertebral body: Uncertain etiology, could be infectious vs. other.  - Consider MRI w/ and w/o contrast after duration of antibiotics is completed. ID recommended no change in therapy.   Acute hypoxic respiratory failure: Due to pulmonary edema. Resolved.   Iron deficiency anemia and anemia of acute illness:  - Monitor and transfuse prn.  - Iron supplement  T2DM: A1c 8.5%.  - At inpatient goal on SSI, will continue  Thrombocytopenia: Resolved.  Oral candidiasis: Treated with fluconazole  Obesity: BMI 34.  - Dietitian consulted  DVT prophylaxis: Coumadin Code Status: Full Family Communication: None at bedside Disposition Plan: SNF if renal function stabilizing over the weekend, will pull cath on Monday and discharge. Discussed with CSW who will start authorization process.   Consultants:   ID  IR  Cardiology  PCCM  Nephrology  Procedures:   Temporary HD catheter  HD  Antimicrobials:  Daptomycin    Subjective: No muscle aches or focal weakness, denies fevers. No BM since yesterday  at 4am. No bleeding/unusual bruising. Pain at amputation site is well controlled.  Objective: Vitals:   02/20/18  1631 02/20/18 2102 02/21/18 0414 02/21/18 0735  BP: (!) 146/67 140/69 139/67 (!) 150/73  Pulse: 86 87 73 79  Resp: 18 18 17 18   Temp: 98.6 F (37 C) 98.5 F (36.9 C) 98.2 F (36.8 C) 98.2 F (36.8 C)  TempSrc: Oral  Oral Oral  SpO2: 98% 97% 100% 98%  Weight:      Height:        Intake/Output Summary (Last 24 hours) at 02/21/2018 1433 Last data filed at 02/21/2018 1410 Gross per 24 hour  Intake 700 ml  Output 1450 ml  Net -750 ml   Filed Weights   02/17/18 0947 02/17/18 1400 02/17/18 2021  Weight: 112 kg 108 kg 108 kg   Gen: 62 y.o. male in no distress Pulm: Nonlabored breathing room air. Clear. CV: Regular rate and rhythm. II/VI systolic murmur, rub, or gallop. No JVD, no significant dependent edema. GI: Abdomen soft, non-tender, non-distended, with normoactive bowel sounds.  Ext: Warm, transtibial amputations again noted, no changes.  Skin: Right IJ tunneled catheter is uncomfortable to patient but not tender to palpation, erythematous and without discharge. Right amputation site not examined today. No other rashes, lesions or ulcers on visualized skin.  Neuro: Alert and oriented. No focal neurological deficits. Psych: Judgement and insight appear fair. Mood euthymic & affect congruent. Behavior is appropriate.    Data Reviewed: I have personally reviewed following labs and imaging studies  CBC: Recent Labs  Lab 02/17/18 0836 02/21/18 0803  WBC 9.7 9.8  HGB 8.3* 8.6*  HCT 26.5* 28.2*  MCV 90.1 91.0  PLT 186 756   Basic Metabolic Panel: Recent Labs  Lab 02/17/18 0343 02/18/18 0414 02/19/18 0449 02/20/18 1327 02/21/18 0803  NA 135 136 135 136 136  K 3.9 4.2 4.0 3.8 3.9  CL 101 99 99 103 104  CO2 26 28 26 24 23   GLUCOSE 157* 236* 115* 138* 179*  BUN 56* 30* 50* 67* 74*  CREATININE 4.92* 3.53* 4.43* 3.91* 3.45*  CALCIUM 7.3* 7.6* 7.7* 7.7* 7.8*  PHOS 4.8* 3.7 4.4 4.8* 5.4*   GFR: Estimated Creatinine Clearance: 27.7 mL/min (A) (by C-G formula based on  SCr of 3.45 mg/dL (H)). Liver Function Tests: Recent Labs  Lab 02/17/18 0343 02/18/18 0414 02/19/18 0449 02/20/18 1327 02/21/18 0803  ALBUMIN 2.1* 2.1* 2.1* 2.2* 2.2*   No results for input(s): LIPASE, AMYLASE in the last 168 hours. No results for input(s): AMMONIA in the last 168 hours. Coagulation Profile: Recent Labs  Lab 02/17/18 0343 02/18/18 0414 02/19/18 0449 02/20/18 0332 02/21/18 0803  INR 1.98 2.65 2.57 2.87 2.88   Cardiac Enzymes: Recent Labs  Lab 02/20/18 0332  CKTOTAL 429*   BNP (last 3 results) No results for input(s): PROBNP in the last 8760 hours. HbA1C: No results for input(s): HGBA1C in the last 72 hours. CBG: Recent Labs  Lab 02/20/18 1142 02/20/18 1635 02/20/18 2106 02/21/18 0734 02/21/18 1134  GLUCAP 151* 175* 180* 169* 183*   Lipid Profile: No results for input(s): CHOL, HDL, LDLCALC, TRIG, CHOLHDL, LDLDIRECT in the last 72 hours. Thyroid Function Tests: No results for input(s): TSH, T4TOTAL, FREET4, T3FREE, THYROIDAB in the last 72 hours. Anemia Panel: No results for input(s): VITAMINB12, FOLATE, FERRITIN, TIBC, IRON, RETICCTPCT in the last 72 hours. Urine analysis:    Component Value Date/Time   COLORURINE YELLOW 02/19/2018 1441   APPEARANCEUR CLOUDY (A) 02/19/2018 1441  LABSPEC 1.013 02/19/2018 1441   PHURINE 5.0 02/19/2018 1441   GLUCOSEU NEGATIVE 02/19/2018 1441   HGBUR LARGE (A) 02/19/2018 1441   BILIRUBINUR NEGATIVE 02/19/2018 1441   KETONESUR NEGATIVE 02/19/2018 1441   PROTEINUR 100 (A) 02/19/2018 1441   UROBILINOGEN 0.2 07/28/2014 0433   NITRITE NEGATIVE 02/19/2018 1441   LEUKOCYTESUR LARGE (A) 02/19/2018 1441   Recent Results (from the past 240 hour(s))  Gastrointestinal Panel by PCR , Stool     Status: None   Collection Time: 02/18/18 12:18 PM  Result Value Ref Range Status   Campylobacter species NOT DETECTED NOT DETECTED Final   Plesimonas shigelloides NOT DETECTED NOT DETECTED Final   Salmonella species NOT  DETECTED NOT DETECTED Final   Yersinia enterocolitica NOT DETECTED NOT DETECTED Final   Vibrio species NOT DETECTED NOT DETECTED Final   Vibrio cholerae NOT DETECTED NOT DETECTED Final   Enteroaggregative E coli (EAEC) NOT DETECTED NOT DETECTED Final   Enteropathogenic E coli (EPEC) NOT DETECTED NOT DETECTED Final   Enterotoxigenic E coli (ETEC) NOT DETECTED NOT DETECTED Final   Shiga like toxin producing E coli (STEC) NOT DETECTED NOT DETECTED Final   Shigella/Enteroinvasive E coli (EIEC) NOT DETECTED NOT DETECTED Final   Cryptosporidium NOT DETECTED NOT DETECTED Final   Cyclospora cayetanensis NOT DETECTED NOT DETECTED Final   Entamoeba histolytica NOT DETECTED NOT DETECTED Final   Giardia lamblia NOT DETECTED NOT DETECTED Final   Adenovirus F40/41 NOT DETECTED NOT DETECTED Final   Astrovirus NOT DETECTED NOT DETECTED Final   Norovirus GI/GII NOT DETECTED NOT DETECTED Final   Rotavirus A NOT DETECTED NOT DETECTED Final   Sapovirus (I, II, IV, and V) NOT DETECTED NOT DETECTED Final    Comment: Performed at [facility], [address], [place], [place] [address]      Radiology Studies: No results found.  Scheduled Meds: . Chlorhexidine Gluconate Cloth  6 each Topical Q0600  . darbepoetin (ARANESP) injection - NON-DIALYSIS  200 mcg Subcutaneous Q Fri-1800  . feeding supplement (PRO-STAT SUGAR FREE 64)  30 mL Oral BID BM  . insulin aspart  0-5 Units Subcutaneous QHS  . insulin aspart  0-9 Units Subcutaneous TID WC  . insulin aspart  4 Units Subcutaneous TID WC  . insulin glargine  22 Units Subcutaneous QHS  . latanoprost  1 drop Both Eyes QHS  . mouth rinse  15 mL Mouth Rinse BID  . metoprolol tartrate  50 mg Oral BID  . multivitamin  1 tablet Oral QHS  . nystatin   Topical BID  . pantoprazole  40 mg Oral Q1200  . saccharomyces boulardii  250 mg Oral BID  . traZODone  50 mg Oral QHS  . warfarin  2.5 mg Oral q1800  . Warfarin - Pharmacist Dosing Inpatient   Does not  apply q1800   Continuous Infusions: . sodium chloride Stopped (02/20/18 0530)  . DAPTOmycin (CUBICIN)  IV Stopped (02/20/18 0005)  . ferumoxytol Stopped (02/17/18 1234)     LOS: 25 days   Time spent: 25 minutes.  Patrecia Pour, MD [facility] [email or web address] Password [facility] 02/21/2018, 2:33 PM

## 2018-02-21 NOTE — Progress Notes (Signed)
ANTICOAGULATION + ANTIBIOTIC CONSULT NOTE - Follow Up Consult  Pharmacy Consult for Coumadin and Daptomycin Indication: atrial fibrillation and bacteremia  Allergies  Allergen Reactions  . Ace Inhibitors Cough  . Cefazolin     Possible cause of AKI, AIN    Patient Measurements: Height: 5\' 10"  (177.8 cm) Weight: 238 lb 1.6 oz (108 kg) IBW/kg (Calculated) : 73  Vital Signs: Temp: 98.2 F (36.8 C) (10/18 0735) Temp Source: Oral (10/18 0735) BP: 150/73 (10/18 0735) Pulse Rate: 79 (10/18 0735)  Labs: Recent Labs    02/19/18 0449 02/20/18 0332 02/20/18 1327 02/21/18 0803  HGB  --   --   --  8.6*  HCT  --   --   --  28.2*  PLT  --   --   --  229  LABPROT 27.3* 29.7*  --  29.8*  INR 2.57 2.87  --  2.88  CREATININE 4.43*  --  3.91* 3.45*  CKTOTAL  --  429*  --   --     Estimated Creatinine Clearance: 27.7 mL/min (A) (by C-G formula based on SCr of 3.45 mg/dL (H)).  Assessment:  62 yo M with new onset PAF on Coumadin. Coumadin started on 10/1, achieved therapeutic INR, then held for [facility] placement. Received Vitamin K 1 mg on 10/7 and 10/8.   Coumadin resumed on 02/13/18 after [facility] placement.       INR remains therapeutic (2.88), no further upward trend. Has had Coumadin 5 mg daily x 5 days then 2.5 mg x 3 days.    Continues on Daptomycin for MSSA bacteremia and TV endocarditis. Changed from Cefazolin as possible cause of AKI / AIN.   Weekly CK done this am, up to 429 (normal range 49-397).  Last CK 64 on 10/10.  CKs have been variable since Daptomycin started on 01/29/18 (range 33-239).  Daptomycin should be held if symptoms of myopathy and CK >5 x upper limit of normal or 1000; or in asymptomatic patients with CK >10 x ULN or >2000.  No myopathy reported.  Goal of Therapy:  INR 2-3 Monitor platelets by anticoagulation protocol: Yes   Plan:   Coumadin 2.5 mg daily at 6pm.  Daily PT/INR. Decrease frequency if remains stable.  Continue Daptomycin 700 mg IV q48hrs. Next dose  due tonight at [facility] on 10/20.  CK at least weekly while on Daptomcyin (Q Thursday).  Follow renal function, monitor for myopathy.  Arty Baumgartner, [place] Pager: [phone] or phone: [phone] 02/21/2018,12:20 PM

## 2018-02-21 NOTE — Progress Notes (Signed)
Subjective: Interval History: has no complaint .  Objective: Vital signs in last 24 hours: Temp:  [98.2 F (36.8 C)-98.6 F (37 C)] 98.2 F (36.8 C) (10/18 0735) Pulse Rate:  [73-87] 79 (10/18 0735) Resp:  [17-18] 18 (10/18 0735) BP: (125-150)/(63-73) 150/73 (10/18 0735) SpO2:  [97 %-100 %] 98 % (10/18 0735) Weight change:   Intake/Output from previous day: 10/17 0701 - 10/18 0700 In: 800 [P.O.:800] Out: 1300 [Urine:1300] Intake/Output this shift: Total I/O In: 420 [P.O.:420] Out: 300 [Urine:300]  General appearance: alert, cooperative and pale Resp: diminished breath sounds bilaterally Chest wall: RIJ PC Cardio: S1, S2 normal and systolic murmur: systolic ejection 2/6, crescendo and decrescendo at 2nd left intercostal space GI: soft, pos bs,  Extremities: edema 2+ and bilat BKAs  Lab Results: Recent Labs    02/21/18 0803  WBC 9.8  HGB 8.6*  HCT 28.2*  PLT 229   BMET:  Recent Labs    02/20/18 1327 02/21/18 0803  NA 136 136  K 3.8 3.9  CL 103 104  CO2 24 23  GLUCOSE 138* 179*  BUN 67* 74*  CREATININE 3.91* 3.45*  CALCIUM 7.7* 7.8*   Recent Labs    02/18/18 1304  PTH 126*   Iron Studies: No results for input(s): IRON, TIBC, TRANSFERRIN, FERRITIN in the last 72 hours.  Studies/Results: No results found.  I have reviewed the patient's current medications.  Assessment/Plan: 1 AKI  AIN from AB.  Staph endocarditis , TV.  Improving now off HD.  Vol xs yet but stable. Mild acidemia. If cont to improve , d/c PC on Mon 2 Anemia on esa/Fe 3 HPTH 4 Endocarditis AB 5 Obstructive uropathy 6 DM controlled 7 PVD  8 AVR 9 CAD P follow chem, diet, follow urine vol. Control glu, AB    LOS: 25 days   Jeneen Rinks Selin Eisler 02/21/2018,10:14 AM

## 2018-02-21 NOTE — Progress Notes (Signed)
Physical Therapy Treatment Patient Details Name: [name] MRN: [id number] DOB: 1955/09/22 Today's Date: 02/21/2018    History of Present Illness Pt is a 62 y/o male with a complicated PMH hospitalized September for sepsis secondary to right foot osteomyelitis, underwent BKA, found to have MSSA bacteremia with splenic infarct and tricuspid valve endocarditis. He was discharged to SNF and developed acute oliguria - presented to the ED where he was found to have AKI.  Developed acute respiratory failure necessitating transfer to ICU and critical care service.  Temporary dialysis catheter was placed and the patient was started on HD.  Respiratory status improved and he was transferred back out to the hospital service.    PT Comments    Drop arm recliner brought into room for attempt at lateral scoot from bed>chair. Even with bed pad use to assist with scooting, pt would require +2 assist for safe transfer. Instead opted for posterior transfer into chair which was able to be achieved with mod assist. Asked unit to order an amputee sling for maxi-move lift for back to bed. Will continue to follow.    Follow Up Recommendations  SNF;Supervision/Assistance - 24 hour     Equipment Recommendations  None recommended by PT    Recommendations for Other Services Rehab consult     Precautions / Restrictions Precautions Precautions: Fall Restrictions Weight Bearing Restrictions: Yes RLE Weight Bearing: Non weight bearing LLE Weight Bearing: Weight bearing as tolerated Other Position/Activity Restrictions: Has a prosthesis for LLE    Mobility  Bed Mobility Overal bed mobility: Needs Assistance Bed Mobility: Supine to Sit     Supine to sit: Mod assist     General bed mobility comments: Heavy use of rails and very effortful to transition to EOB. Pt reaching for therapist's hands to pull trunk into full sitting position.   Transfers Overall transfer level: Needs assistance Equipment  used: Rolling walker (2 wheeled) Transfers: Comptroller transfers: Mod assist;From elevated surface   General transfer comment: From slightly elevated surface so pt scooting "downhill". Pt was able to scoot himself backwards on the bed with bedpad assist into the recliner chair.   Ambulation/Gait             General Gait Details: Not assessed this session   Stairs             Wheelchair Mobility    Modified Rankin (Stroke Patients Only)       Balance Overall balance assessment: Needs assistance Sitting-balance support: No upper extremity supported Sitting balance-Leahy Scale: Poor Sitting balance - Comments: Required UE support to maintain sitting balance - could partly be contributed to air mattress and uneven sitting surface.                                     Cognition Arousal/Alertness: Awake/alert Behavior During Therapy: WFL for tasks assessed/performed Overall Cognitive Status: Within Functional Limits for tasks assessed                                        Exercises      General Comments        Pertinent Vitals/Pain Pain Assessment: No/denies pain    Home Living Family/patient expects to be discharged to:: Skilled nursing facility Living Arrangements: Alone Available Help at  Discharge: Family;Available PRN/intermittently Type of Home: Mobile home Home Access: Ramped entrance   Home Layout: One level Home Equipment: [place] - 2 wheels;Wheelchair - manual;Cane - single point;Tub bench Additional Comments: Pt's WC does not fit around his house, only in his living room and then he uses an office chair on wheels to push himself around in his kitchen    Prior Function Level of Independence: Independent with assistive device(s)      Comments: has a L LE prosthesis; used a cane PRN on uneven surfaces prior to RLE amputation   PT Goals (current goals can now be found in  the care plan section) Acute Rehab PT Goals Patient Stated Goal: to get a Rt Prosthesis, be able to walk and take care of self  PT Goal Formulation: With patient Time For Goal Achievement: 03/04/18 Potential to Achieve Goals: Good Progress towards PT goals: Progressing toward goals    Frequency    Min 2X/week      PT Plan Current plan remains appropriate    Co-evaluation              AM-PAC PT "6 Clicks" Daily Activity  Outcome Measure  Difficulty turning over in bed (including adjusting bedclothes, sheets and blankets)?: Unable Difficulty moving from lying on back to sitting on the side of the bed? : Unable Difficulty sitting down on and standing up from a chair with arms (e.g., wheelchair, bedside commode, etc,.)?: Unable Help needed moving to and from a bed to chair (including a wheelchair)?: A Lot Help needed walking in hospital room?: Total Help needed climbing 3-5 steps with a railing? : Total 6 Click Score: 7    End of Session Equipment Utilized During Treatment: Gait belt Activity Tolerance: Patient tolerated treatment well Patient left: in chair;with call bell/phone within reach;with chair alarm set(Geomat under pt) Nurse Communication: Mobility status PT Visit Diagnosis: Other abnormalities of gait and mobility (R26.89) Pain - Right/Left: Right Pain - part of body: Leg     Time: 5750-5183 PT Time Calculation (min) (ACUTE ONLY): 37 min  Charges:  $Therapeutic Activity: 23-37 mins                     [name], PT, DPT Acute Rehabilitation Services Pager: [phone] Office: [phone]    [name] 02/21/2018, 1:25 PM

## 2018-02-21 NOTE — Clinical Social Work Note (Addendum)
CSW talked with patient earlier today and provided him with another SNF list and facilities that indicted that could accept him. The SNF's that accepted are: [facility], [facility] and [facility]. Patient chose [facility], and also wants to talk with his family. CSW called niece [name] [phone]) and also texted her regarding patient and his SNF decision. Talked with [name] at 4:30 pm and provided her with facility responses and explained needing a decision so that the facility can initiate insurance authorization. [name] indicated that she will be visiting with patient on Saturday and wants to view the facility ratings and she was informed that a social worker will contact her. SNF list emailed to niece - [email or web address]. Weekend CSW will be asked to contact niece regarding SNF decision.   [name], MSW, LCSW Licensed Clinical Social Worker [facility] [phone]

## 2018-02-22 LAB — RENAL FUNCTION PANEL
ALBUMIN: 2.1 g/dL — AB (ref 3.5–5.0)
Anion gap: 10 (ref 5–15)
BUN: 77 mg/dL — ABNORMAL HIGH (ref 8–23)
CALCIUM: 7.6 mg/dL — AB (ref 8.9–10.3)
CO2: 23 mmol/L (ref 22–32)
Chloride: 103 mmol/L (ref 98–111)
Creatinine, Ser: 3.18 mg/dL — ABNORMAL HIGH (ref 0.61–1.24)
GFR calc non Af Amer: 20 mL/min — ABNORMAL LOW (ref >60)
GFR, EST AFRICAN AMERICAN: 23 mL/min — AB (ref >60)
GLUCOSE: 130 mg/dL — AB (ref 70–99)
PHOSPHORUS: 5.2 mg/dL — AB (ref 2.5–4.6)
POTASSIUM: 3.6 mmol/L (ref 3.5–5.1)
Sodium: 136 mmol/L (ref 135–145)

## 2018-02-22 LAB — GLUCOSE, CAPILLARY
GLUCOSE-CAPILLARY: 110 mg/dL — AB (ref 70–99)
GLUCOSE-CAPILLARY: 214 mg/dL — AB (ref 70–99)
Glucose-Capillary: 160 mg/dL — ABNORMAL HIGH (ref 70–99)
Glucose-Capillary: 181 mg/dL — ABNORMAL HIGH (ref 70–99)

## 2018-02-22 LAB — PROTIME-INR
INR: 3.36
Prothrombin Time: 33.5 seconds — ABNORMAL HIGH (ref 11.4–15.2)

## 2018-02-22 MED ORDER — DARBEPOETIN ALFA 200 MCG/0.4ML IJ SOSY
200.0000 ug | PREFILLED_SYRINGE | INTRAMUSCULAR | Status: DC
  Administered 2018-02-22: 200 ug via SUBCUTANEOUS
  Filled 2018-02-22: qty 0.4

## 2018-02-22 NOTE — Progress Notes (Addendum)
PROGRESS NOTE  [name]  ZJI:[id number] DOB: 02/04/1956 DOA: 01/27/2018 PCP: [name], MD   Brief Narrative: [name] is a 62 y.o. male with a history of sepsis due to right foot osteomyelitis s/p transtibial amputation and found to have MSSA bacteremia with splenic infarct and tricuspid valve endocarditis. He was discharged on ancef to [facility] but was quickly brought back in due to oliguria and found to have acute renal failure. He developed acute respiratory failure and transferred to ICU on CCM service. Antibiotics changed to daptomycin per ID and nephrology oversaw hemodialysis through right IJ temporary catheter on 10/12 and 10/14. With improvement in respiratory status he was transferred to the medical floor. Nephrology following closely for signs of renal recovery. Creatinine trending downward as of 10/17 with increasing urine output.   Assessment & Plan: Principal Problem:   Acute renal failure (ARF) (HCC) Active Problems:   Insulin-requiring or dependent type II diabetes mellitus ([place])   Essential hypertension   CAD (coronary artery disease)   Leukocytosis   Thrombocytopenia (HCC)   S/P BKA (below knee amputation) unilateral, right (HCC)   Endocarditis of tricuspid valve   Hyponatremia   PAF (paroxysmal atrial fibrillation) (HCC)   Aortic atherosclerosis (HCC)   Acute hypoxemic respiratory failure (HCC)  Oliguric acute renal failure: Due to ATN vs. ancef-induced AIN. Continues slow improvement, CrCl continues slow improvement. UOP up to 1500 past 24 hours. - Continue daily monitoring. Per nephrology, if continues improvement can pull temporary catheter on 10/20 and would be eligible for discharge if stable.   Paroxysmal atrial fibrillation: New diagnosis. Maintaining sinus rhythm. Cardiology consulted, signed off 10/3. - Continue coumadin generally. INR supratherapeutic despite decreased dosing the past few days. Agree with pharmacy to hold  coumadin today, recheck daily. CBC in AM. - Continue metoprolol - Follow up with Dr. Percival Spanish in 3-4 weeks post discharge.  MSSA bacteremia and tricuspid valve endocarditis and splenic infarcts: Stopped ancef.  - Continue daptomycin to complete 6 weeks Tx .Recheck CK in AM, continues to deny myopathy.  Diarrhea: Improving, usually postprandial and not strictly watery diarrhea. Loose stools without significantly increased frequency. No recent travel and GI pathogen panel negative. Suspect antibiotic-associated but not CDiff.  - Continue probiotics 2 weeks following conclusion of antibiotics.  PVD s/p bilateral transtibial amputations: Reevaluated by Dr. Sharol Given during this hospitalization, staples removed from recent right TTA on 10/15.  - Follow up with Dr. Sharol Given and get prosthesis fitting after discharge.   Anemia of acute and chronic disease: Stable hgb.  - Got aranesp [place] 10/18. - Monitor in AM.  Loss of cortex along anterior inferior aspect of T12 vertebral body, lucency in posterior aspect of L1 vertebral body: Uncertain etiology, could be infectious vs. other.  - Consider MRI w/ and w/o contrast after duration of antibiotics is completed. ID recommended no change in therapy.   Acute hypoxic respiratory failure: Due to pulmonary edema. Resolved.   Iron deficiency anemia and anemia of acute illness:  - Monitor and transfuse prn.  - Iron supplement  T2DM: A1c 8.5%.  - At inpatient goal for the most part on SSI, will continue today  Thrombocytopenia: Resolved.  Oral candidiasis: Treated with fluconazole  Obesity: BMI 34.  - Dietitian consulted  DVT prophylaxis: Coumadin (hold today) Code Status: Full Family Communication: None at bedside Disposition Plan: SNF if renal function stabilizing over the weekend, will pull cath on Monday and discharge.  Consultants:   ID  IR  Cardiology  PCCM  Nephrology  Procedures:   Temporary HD  catheter  HD  Antimicrobials:  Daptomycin    Subjective: 1 episode of loose stool in past 24 hours. Not increased frequency of stools, but decreased solid component. No abd pain, N/V, fever.   Objective: Vitals:   02/21/18 2050 02/22/18 0442 02/22/18 0909 02/22/18 1619  BP: 127/64 139/67 138/66 127/61  Pulse: 85 80 88 82  Resp: 20 18 18 18   Temp: 98.1 F (36.7 C) 98 F (36.7 C) 98.1 F (36.7 C) 98.3 F (36.8 C)  TempSrc: Oral Oral    SpO2: 97% 100% 100% 98%  Weight: 108.1 kg     Height:        Intake/Output Summary (Last 24 hours) at 02/22/2018 1635 Last data filed at 02/22/2018 1545 Gross per 24 hour  Intake 700 ml  Output 1800 ml  Net -1100 ml   Filed Weights   02/17/18 1400 02/17/18 2021 02/21/18 2050  Weight: 108 kg 108 kg 108.1 kg   Gen: 62 y.o. male in no distress Pulm: Nonlabored breathing room air. Clear. CV: Regular rate and rhythm. II/VI murmur, rub, or gallop. No JVD, no dependent edema. GI: Abdomen soft, non-tender, non-distended, with normoactive bowel sounds.  Ext: Warm, transtibial amputation site dressing/c/di on right, normal stump, warm on left.  Skin: No new rashes, lesions or ulcers on visualized skin. RIJ c/d/i without tenderness, erythema.  Neuro: Alert and oriented. No focal neurological deficits. Psych: Judgement and insight appear fair. Mood euthymic & affect congruent. Behavior is appropriate.    Data Reviewed: I have personally reviewed following labs and imaging studies  CBC: Recent Labs  Lab 02/17/18 0836 02/21/18 0803  WBC 9.7 9.8  HGB 8.3* 8.6*  HCT 26.5* 28.2*  MCV 90.1 91.0  PLT 186 376   Basic Metabolic Panel: Recent Labs  Lab 02/18/18 0414 02/19/18 0449 02/20/18 1327 02/21/18 0803 02/22/18 0330  NA 136 135 136 136 136  K 4.2 4.0 3.8 3.9 3.6  CL 99 99 103 104 103  CO2 28 26 24 23 23   GLUCOSE 236* 115* 138* 179* 130*  BUN 30* 50* 67* 74* 77*  CREATININE 3.53* 4.43* 3.91* 3.45* 3.18*  CALCIUM 7.6* 7.7* 7.7*  7.8* 7.6*  PHOS 3.7 4.4 4.8* 5.4* 5.2*   GFR: Estimated Creatinine Clearance: 30 mL/min (A) (by C-G formula based on SCr of 3.18 mg/dL (H)). Liver Function Tests: Recent Labs  Lab 02/18/18 0414 02/19/18 0449 02/20/18 1327 02/21/18 0803 02/22/18 0330  ALBUMIN 2.1* 2.1* 2.2* 2.2* 2.1*   No results for input(s): LIPASE, AMYLASE in the last 168 hours. No results for input(s): AMMONIA in the last 168 hours. Coagulation Profile: Recent Labs  Lab 02/18/18 0414 02/19/18 0449 02/20/18 0332 02/21/18 0803 02/22/18 0330  INR 2.65 2.57 2.87 2.88 3.36   Cardiac Enzymes: Recent Labs  Lab 02/20/18 0332  CKTOTAL 429*   BNP (last 3 results) No results for input(s): PROBNP in the last 8760 hours. HbA1C: No results for input(s): HGBA1C in the last 72 hours. CBG: Recent Labs  Lab 02/21/18 1134 02/21/18 1650 02/21/18 2052 02/22/18 0730 02/22/18 1144  GLUCAP 183* 145* 188* 110* 214*   Lipid Profile: No results for input(s): CHOL, HDL, LDLCALC, TRIG, CHOLHDL, LDLDIRECT in the last 72 hours. Thyroid Function Tests: No results for input(s): TSH, T4TOTAL, FREET4, T3FREE, THYROIDAB in the last 72 hours. Anemia Panel: No results for input(s): VITAMINB12, FOLATE, FERRITIN, TIBC, IRON, RETICCTPCT in the last 72 hours. Urine analysis:    Component Value  Date/Time   COLORURINE YELLOW 02/19/2018 1441   APPEARANCEUR CLOUDY (A) 02/19/2018 1441   LABSPEC 1.013 02/19/2018 1441   PHURINE 5.0 02/19/2018 1441   GLUCOSEU NEGATIVE 02/19/2018 1441   HGBUR LARGE (A) 02/19/2018 1441   BILIRUBINUR NEGATIVE 02/19/2018 1441   KETONESUR NEGATIVE 02/19/2018 1441   PROTEINUR 100 (A) 02/19/2018 1441   UROBILINOGEN 0.2 07/28/2014 0433   NITRITE NEGATIVE 02/19/2018 1441   LEUKOCYTESUR LARGE (A) 02/19/2018 1441   Recent Results (from the past 240 hour(s))  Gastrointestinal Panel by PCR , Stool     Status: None   Collection Time: 02/18/18 12:18 PM  Result Value Ref Range Status   Campylobacter  species NOT DETECTED NOT DETECTED Final   Plesimonas shigelloides NOT DETECTED NOT DETECTED Final   Salmonella species NOT DETECTED NOT DETECTED Final   Yersinia enterocolitica NOT DETECTED NOT DETECTED Final   Vibrio species NOT DETECTED NOT DETECTED Final   Vibrio cholerae NOT DETECTED NOT DETECTED Final   Enteroaggregative E coli (EAEC) NOT DETECTED NOT DETECTED Final   Enteropathogenic E coli (EPEC) NOT DETECTED NOT DETECTED Final   Enterotoxigenic E coli (ETEC) NOT DETECTED NOT DETECTED Final   Shiga like toxin producing E coli (STEC) NOT DETECTED NOT DETECTED Final   Shigella/Enteroinvasive E coli (EIEC) NOT DETECTED NOT DETECTED Final   Cryptosporidium NOT DETECTED NOT DETECTED Final   Cyclospora cayetanensis NOT DETECTED NOT DETECTED Final   Entamoeba histolytica NOT DETECTED NOT DETECTED Final   Giardia lamblia NOT DETECTED NOT DETECTED Final   Adenovirus F40/41 NOT DETECTED NOT DETECTED Final   Astrovirus NOT DETECTED NOT DETECTED Final   Norovirus GI/GII NOT DETECTED NOT DETECTED Final   Rotavirus A NOT DETECTED NOT DETECTED Final   Sapovirus (I, II, IV, and V) NOT DETECTED NOT DETECTED Final    Comment: Performed at [facility], [address], [place], [place] [address]      Radiology Studies: No results found.  Scheduled Meds: . Chlorhexidine Gluconate Cloth  6 each Topical Q0600  . darbepoetin (ARANESP) injection - NON-DIALYSIS  200 mcg Subcutaneous Q Sat-1800  . feeding supplement (PRO-STAT SUGAR FREE 64)  30 mL Oral BID BM  . insulin aspart  0-5 Units Subcutaneous QHS  . insulin aspart  0-9 Units Subcutaneous TID WC  . insulin aspart  4 Units Subcutaneous TID WC  . insulin glargine  22 Units Subcutaneous QHS  . latanoprost  1 drop Both Eyes QHS  . mouth rinse  15 mL Mouth Rinse BID  . metoprolol tartrate  50 mg Oral BID  . multivitamin  1 tablet Oral QHS  . nystatin   Topical BID  . pantoprazole  40 mg Oral Q1200  . saccharomyces boulardii  250 mg  Oral BID  . traZODone  50 mg Oral QHS  . Warfarin - Pharmacist Dosing Inpatient   Does not apply q1800   Continuous Infusions: . sodium chloride Stopped (02/20/18 0530)  . DAPTOmycin (CUBICIN)  IV 700 mg (02/21/18 2200)  . ferumoxytol Stopped (02/17/18 1234)     LOS: 26 days   Time spent: 25 minutes.  Raymond Pour, MD [facility] [email or web address] Password [facility] 02/22/2018, 4:35 PM

## 2018-02-22 NOTE — Progress Notes (Signed)
ANTICOAGULATION CONSULT NOTE - Follow Up Consult  Pharmacy Consult for Coumadin  Indication: atrial fibrillation   Allergies  Allergen Reactions  . Ace Inhibitors Cough  . Cefazolin     Possible cause of AKI, AIN    Patient Measurements: Height: 5\' 10"  (177.8 cm) Weight: 238 lb 5.1 oz (108.1 kg) IBW/kg (Calculated) : 73  Vital Signs: Temp: 98.1 F (36.7 C) (10/19 0909) Temp Source: Oral (10/19 0442) BP: 138/66 (10/19 0909) Pulse Rate: 88 (10/19 0909)  Labs: Recent Labs    02/20/18 0332 02/20/18 1327 02/21/18 0803 02/22/18 0330  HGB  --   --  8.6*  --   HCT  --   --  28.2*  --   PLT  --   --  229  --   LABPROT 29.7*  --  29.8* 33.5*  INR 2.87  --  2.88 3.36  CREATININE  --  3.91* 3.45* 3.18*  CKTOTAL 429*  --   --   --     Estimated Creatinine Clearance: 30 mL/min (A) (by C-G formula based on SCr of 3.18 mg/dL (H)).  Assessment:  62 yo M with new onset PAF on Coumadin. Coumadin started on 10/1, achieved therapeutic INR, then held for [facility] placement. Received Vitamin K 1 mg on 10/7 and 10/8.   Coumadin resumed on 02/13/18 after [facility] placement.    INR today is SUPRAtherapeutic (INR 3.36 << 2.88, goal of 2-3), no CBC today. Will hold today's dose and monitor trends in INR down.    Goal of Therapy:  INR 2-3 Monitor platelets by anticoagulation protocol: Yes   Plan:  - Hold warfarin dose today - Will continue to monitor for any signs/symptoms of bleeding and will follow up with PT/INR in the a.m.   Thank you for allowing pharmacy to be a part of this patient's care.  Alycia Rossetti, PharmD, BCPS Clinical Pharmacist Pager: [phone] Clinical phone for 02/22/2018 from 7a-3:30p: [phone] If after 3:30p, please call main pharmacy at: [phone] Please check AMION for all [place] numbers 02/22/2018 11:44 AM

## 2018-02-22 NOTE — Progress Notes (Signed)
Subjective: Interval History: has complaints D from AB..  Objective: Vital signs in last 24 hours: Temp:  [98 F (36.7 C)-98.1 F (36.7 C)] 98.1 F (36.7 C) (10/19 0909) Pulse Rate:  [80-88] 88 (10/19 0909) Resp:  [18-20] 18 (10/19 0909) BP: (127-146)/(64-69) 138/66 (10/19 0909) SpO2:  [97 %-100 %] 100 % (10/19 0909) Weight:  [108.1 kg] 108.1 kg (10/18 2050) Weight change:   Intake/Output from previous day: 10/18 0701 - 10/19 0700 In: 860 [P.O.:860] Out: 1500 [Urine:1500] Intake/Output this shift: Total I/O In: 240 [P.O.:240] Out: -   General appearance: alert, cooperative and no distress Resp: rhonchi bibasilar Chest wall: RIJ cath Cardio: S1, S2 normal and systolic murmur: systolic ejection 2/6, crescendo and decrescendo at 2nd left intercostal space GI: soft, pos bs, liver down 4 cm Extremities: edema 1= and bila BKAs  Lab Results: Recent Labs    02/21/18 0803  WBC 9.8  HGB 8.6*  HCT 28.2*  PLT 229   BMET:  Recent Labs    02/21/18 0803 02/22/18 0330  NA 136 136  K 3.9 3.6  CL 104 103  CO2 23 23  GLUCOSE 179* 130*  BUN 74* 77*  CREATININE 3.45* 3.18*  CALCIUM 7.8* 7.6*   No results for input(s): PTH in the last 72 hours. Iron Studies: No results for input(s): IRON, TIBC, TRANSFERRIN, FERRITIN in the last 72 hours.  Studies/Results: No results found.  I have reviewed the patient's current medications.  Assessment/Plan: 1 AKI improving , nonoliguric. Will see about getting PC out.   2 TV endocarditis 3 AVR 4 DM controlled 5 PVD 6 Anemia  P see about getting PC out. , diet glu control     LOS: 26 days   [name] 02/22/2018,10:01 AM

## 2018-02-23 LAB — CBC
HCT: 27.5 % — ABNORMAL LOW (ref 39.0–52.0)
HEMOGLOBIN: 8.3 g/dL — AB (ref 13.0–17.0)
MCH: 27.4 pg (ref 26.0–34.0)
MCHC: 30.2 g/dL (ref 30.0–36.0)
MCV: 90.8 fL (ref 80.0–100.0)
NRBC: 0 % (ref 0.0–0.2)
Platelets: 225 10*3/uL (ref 150–400)
RBC: 3.03 MIL/uL — ABNORMAL LOW (ref 4.22–5.81)
RDW: 13.8 % (ref 11.5–15.5)
WBC: 9.5 10*3/uL (ref 4.0–10.5)

## 2018-02-23 LAB — RENAL FUNCTION PANEL
ALBUMIN: 2.2 g/dL — AB (ref 3.5–5.0)
ANION GAP: 9 (ref 5–15)
BUN: 77 mg/dL — ABNORMAL HIGH (ref 8–23)
CHLORIDE: 106 mmol/L (ref 98–111)
CO2: 24 mmol/L (ref 22–32)
Calcium: 7.8 mg/dL — ABNORMAL LOW (ref 8.9–10.3)
Creatinine, Ser: 2.56 mg/dL — ABNORMAL HIGH (ref 0.61–1.24)
GFR calc Af Amer: 29 mL/min — ABNORMAL LOW (ref >60)
GFR, EST NON AFRICAN AMERICAN: 25 mL/min — AB (ref >60)
Glucose, Bld: 139 mg/dL — ABNORMAL HIGH (ref 70–99)
PHOSPHORUS: 4.7 mg/dL — AB (ref 2.5–4.6)
POTASSIUM: 3.5 mmol/L (ref 3.5–5.1)
Sodium: 139 mmol/L (ref 135–145)

## 2018-02-23 LAB — GLUCOSE, CAPILLARY
GLUCOSE-CAPILLARY: 142 mg/dL — AB (ref 70–99)
GLUCOSE-CAPILLARY: 147 mg/dL — AB (ref 70–99)
GLUCOSE-CAPILLARY: 185 mg/dL — AB (ref 70–99)
GLUCOSE-CAPILLARY: 174 mg/dL — AB (ref 70–99)

## 2018-02-23 LAB — CK: Total CK: 187 U/L (ref 49–397)

## 2018-02-23 LAB — PROTIME-INR
INR: 2.99
Prothrombin Time: 30.6 seconds — ABNORMAL HIGH (ref 11.4–15.2)

## 2018-02-23 MED ORDER — WARFARIN SODIUM 2 MG PO TABS
2.0000 mg | ORAL_TABLET | Freq: Once | ORAL | Status: AC
  Administered 2018-02-23: 2 mg via ORAL
  Filled 2018-02-23: qty 1

## 2018-02-23 MED ORDER — LOPERAMIDE HCL 2 MG PO CAPS
2.0000 mg | ORAL_CAPSULE | ORAL | Status: DC | PRN
  Administered 2018-02-23 – 2018-03-10 (×14): 2 mg via ORAL
  Filled 2018-02-23 (×15): qty 1

## 2018-02-23 MED ORDER — SODIUM CHLORIDE 0.9 % IV SOLN
700.0000 mg | INTRAVENOUS | Status: DC
  Administered 2018-02-23 – 2018-02-25 (×3): 700 mg via INTRAVENOUS
  Filled 2018-02-23 (×5): qty 14

## 2018-02-23 NOTE — Progress Notes (Signed)
Subjective: Interval History: has no complaint, deciding which facility to go to..  Objective: Vital signs in last 24 hours: Temp:  [98.1 F (36.7 C)-98.6 F (37 C)] 98.6 F (37 C) (10/20 0508) Pulse Rate:  [81-88] 81 (10/20 0508) Resp:  [18-19] 18 (10/20 0508) BP: (127-146)/(60-80) 146/80 (10/20 0508) SpO2:  [97 %-100 %] 99 % (10/20 0508) Weight:  [108 kg] 108 kg (10/20 0500) Weight change: -0.1 kg  Intake/Output from previous day: 10/19 0701 - 10/20 0700 In: 840 [P.O.:840] Out: 1575 [Urine:1575] Intake/Output this shift: No intake/output data recorded.  General appearance: alert, cooperative, no distress and pale Resp: clear to auscultation bilaterally Chest wall: RIJ PC Cardio: S1, S2 normal and systolic murmur: systolic ejection 2/6, crescendo and decrescendo at 2nd left intercostal space GI: soft, liver down 4 cm , pos bs Extremities: edema 1+ and Bilat BKAs  Lab Results: Recent Labs    02/21/18 0803 02/23/18 0338  WBC 9.8 9.5  HGB 8.6* 8.3*  HCT 28.2* 27.5*  PLT 229 225   BMET:  Recent Labs    02/22/18 0330 02/23/18 0338  NA 136 139  K 3.6 3.5  CL 103 106  CO2 23 24  GLUCOSE 130* 139*  BUN 77* 77*  CREATININE 3.18* 2.56*  CALCIUM 7.6* 7.8*   No results for input(s): PTH in the last 72 hours. Iron Studies: No results for input(s): IRON, TIBC, TRANSFERRIN, FERRITIN in the last 72 hours.  Studies/Results: No results found.  I have reviewed the patient's current medications.  Assessment/Plan: 1 AKI ATN resolving. Good urine vol, No need for dialysis, will get PC out 2 DM controlled 3 Endocarditis AB 4 AVR 5 Anemia low but should get better, getting Fe, had esa 6 PVD, bilat BKAs P will s/o at this time, get IR to get cath out. Can f/u with Dr. Justin Mend after d/c    LOS: 27 days   Jeneen Rinks Faustina Gebert 02/23/2018,7:24 AM

## 2018-02-23 NOTE — Progress Notes (Signed)
Pharmacy Antibiotic Note  [name] is a 62 y.o. male  with MSSA bacteremia and TV endocarditis. Pharmacy consulted for Daptomycin dosing.   The patient had AKI this admit requiring IHD - last session on 10/14 and now showing renal recovery. Renal planning to remove the patient's [facility] and have signed off. SCr has trended down to 2.56 << 3.18, nCrCl~30-35 ml/min requiring a dose adjustment.   CK drawn this AM is 187 and trended down from the prior result of 429 on 10/17. Next check planned for 10/24.   Plan: - Adjust Daptomycin to 700 mg (~8mg /kg) IV q24h - Will f/u weekly CK on Thursdays - Will continue to follow renal function, culture results, LOT, and antibiotic de-escalation plans   Height: 5\' 10"  (177.8 cm) Weight: 238 lb 1.6 oz (108 kg) IBW/kg (Calculated) : 73  Temp (24hrs), Avg:98.4 F (36.9 C), Min:98.2 F (36.8 C), Max:98.6 F (37 C)  Recent Labs  Lab 02/17/18 0836  02/19/18 0449 02/20/18 1327 02/21/18 0803 02/22/18 0330 02/23/18 0338  WBC 9.7  --   --   --  9.8  --  9.5  CREATININE  --    < > 4.43* 3.91* 3.45* 3.18* 2.56*   < > = values in this interval not displayed.    Estimated Creatinine Clearance: 37.3 mL/min (A) (by C-G formula based on SCr of 2.56 mg/dL (H)).    Allergies  Allergen Reactions  . Ace Inhibitors Cough  . Cefazolin     Possible cause of AKI, AIN   Thank you for allowing pharmacy to be a part of this patient's care.  Alycia Rossetti, PharmD, BCPS Clinical Pharmacist Pager: [phone] Clinical phone for 02/23/2018 from 7a-3:30p: [phone] If after 3:30p, please call main pharmacy at: [phone] Please check AMION for all [place] numbers 02/23/2018 12:32 PM

## 2018-02-23 NOTE — Progress Notes (Signed)
PROGRESS NOTE  [name]  ZOX:[id number] DOB: Nov 16, 1955 DOA: 01/27/2018 PCP: [name], MD   Brief Narrative: [name] is a 62 y.o. male with a history of sepsis due to right foot osteomyelitis s/p transtibial amputation and found to have MSSA bacteremia with splenic infarct and tricuspid valve endocarditis. He was discharged on ancef to [facility] but was quickly brought back in due to oliguria and found to have acute renal failure. He developed acute respiratory failure and transferred to ICU on CCM service. Antibiotics changed to daptomycin per ID and nephrology oversaw hemodialysis through right IJ temporary catheter on 10/12 and 10/14. With improvement in respiratory status he was transferred to the medical floor. Nephrology following closely for signs of renal recovery. Creatinine trending downward as of 10/17 with increasing urine output.   Assessment & Plan: Principal Problem:   Acute renal failure (ARF) (HCC) Active Problems:   Insulin-requiring or dependent type II diabetes mellitus ([place])   Essential hypertension   CAD (coronary artery disease)   Leukocytosis   Thrombocytopenia (HCC)   S/P BKA (below knee amputation) unilateral, right (HCC)   Endocarditis of tricuspid valve   Hyponatremia   PAF (paroxysmal atrial fibrillation) (HCC)   Aortic atherosclerosis (HCC)   Acute hypoxemic respiratory failure (HCC)  Oliguric acute renal failure: Due to ATN vs. ancef-induced AIN. Continues slow improvement, CrCl continues slow improvement. UOP up to 1500 past 24 hours. - Continue daily monitoring. Per nephrology, if continues improvement can pull temporary catheter on 10/20 and would be eligible for discharge if stable.   Paroxysmal atrial fibrillation: New diagnosis. Maintaining sinus rhythm. Cardiology consulted, signed off 10/3. - Continue coumadin dosed per pharmacy. Therapeutic INR today. CBC stable. - Continue metoprolol - Follow up with Dr. Percival Spanish  in 3-4 weeks post discharge.  MSSA bacteremia and tricuspid valve endocarditis and splenic infarcts: Stopped ancef.  - Continue daptomycin to complete 6 weeks Tx  CK transiently mildly elevated but since resolved. Please recheck CK weekly and continue antibiotics through 11/2  Diarrhea: Improving, usually postprandial and not strictly watery diarrhea. Loose stools without significantly increased frequency. No recent travel and GI pathogen panel negative. Suspect antibiotic-associated but not CDiff.  - Continue probiotics 2 weeks (final dose 11/16) following conclusion of antibiotics. - Ok to give imodium prn  PVD s/p bilateral transtibial amputations: Reevaluated by Dr. Sharol Given during this hospitalization, staples removed from recent right TTA on 10/15.  - Follow up with Dr. Sharol Given and get prosthesis fitting after discharge.   Anemia of acute and chronic disease: Stable hgb.  - Got aranesp [place] 10/18. - Monitor in AM.  Loss of cortex along anterior inferior aspect of T12 vertebral body, lucency in posterior aspect of L1 vertebral body: Uncertain etiology, could be infectious vs. other.  - Consider MRI w/ and w/o contrast after duration of antibiotics is completed. ID recommended no change in therapy.   Acute hypoxic respiratory failure: Due to pulmonary edema. Resolved.   Iron deficiency anemia and anemia of acute illness:  - Has been stable in 8's. - Iron supplement  T2DM: A1c 8.5%.  - At inpatient goal on SSI  Thrombocytopenia: Resolved.  Oral candidiasis: Treated with fluconazole  Obesity: BMI 34.  - Dietitian consulted  DVT prophylaxis: Coumadin  Code Status: Full Family Communication: None at bedside Disposition Plan: Plan to pull cath tomorrow and discharge to SNF to start PT and complete IV antibiotics.  Consultants:   ID  IR  Cardiology  PCCM  Nephrology  Procedures:   Temporary HD catheter  HD  Antimicrobials:  Daptomycin    Subjective: Urine output is  improving again, no swelling, dyspnea, chest pain or bleeding. Denies fever or muscle aches. Having loose stools about 1/day, not watery, no abd pain, nausea or vomiting.   Objective: Vitals:   02/22/18 2050 02/23/18 0500 02/23/18 0508 02/23/18 0916  BP: 137/60  (!) 146/80 121/60  Pulse: 86  81 80  Resp: 19  18 18   Temp: 98.4 F (36.9 C)  98.6 F (37 C) 98.2 F (36.8 C)  TempSrc:    Oral  SpO2: 97%  99% 98%  Weight: 108 kg 108 kg    Height:        Intake/Output Summary (Last 24 hours) at 02/23/2018 1313 Last data filed at 02/23/2018 0925 Gross per 24 hour  Intake 840 ml  Output 1825 ml  Net -985 ml   Filed Weights   02/21/18 2050 02/22/18 2050 02/23/18 0500  Weight: 108.1 kg 108 kg 108 kg   Gen: 62 y.o. male in no distress Pulm: Nonlabored breathing room air. Clear. CV: Regular rate and rhythm. II/VI systolic murmur, no rub, or gallop. No dependent edema. GI: Abdomen soft, non-tender, non-distended, with normoactive bowel sounds.  Ext: Warm, no deformities Skin: Right IJ tunneled catheter nontender, no discharge or erythema. No new rashes, lesions or ulcers on visualized skin.  Neuro: Alert and oriented. No focal neurological deficits. Psych: Judgement and insight appear fair. Mood euthymic & affect congruent. Behavior is appropriate.    Data Reviewed: I have personally reviewed following labs and imaging studies  CBC: Recent Labs  Lab 02/17/18 0836 02/21/18 0803 02/23/18 0338  WBC 9.7 9.8 9.5  HGB 8.3* 8.6* 8.3*  HCT 26.5* 28.2* 27.5*  MCV 90.1 91.0 90.8  PLT 186 229 742   Basic Metabolic Panel: Recent Labs  Lab 02/19/18 0449 02/20/18 1327 02/21/18 0803 02/22/18 0330 02/23/18 0338  NA 135 136 136 136 139  K 4.0 3.8 3.9 3.6 3.5  CL 99 103 104 103 106  CO2 26 24 23 23 24   GLUCOSE 115* 138* 179* 130* 139*  BUN 50* 67* 74* 77* 77*  CREATININE 4.43* 3.91* 3.45* 3.18* 2.56*  CALCIUM 7.7* 7.7* 7.8* 7.6* 7.8*  PHOS 4.4 4.8* 5.4* 5.2* 4.7*    GFR: Estimated Creatinine Clearance: 37.3 mL/min (A) (by C-G formula based on SCr of 2.56 mg/dL (H)). Liver Function Tests: Recent Labs  Lab 02/19/18 0449 02/20/18 1327 02/21/18 0803 02/22/18 0330 02/23/18 0338  ALBUMIN 2.1* 2.2* 2.2* 2.1* 2.2*   No results for input(s): LIPASE, AMYLASE in the last 168 hours. No results for input(s): AMMONIA in the last 168 hours. Coagulation Profile: Recent Labs  Lab 02/19/18 0449 02/20/18 0332 02/21/18 0803 02/22/18 0330 02/23/18 0338  INR 2.57 2.87 2.88 3.36 2.99   Cardiac Enzymes: Recent Labs  Lab 02/20/18 0332 02/23/18 0338  CKTOTAL 429* 187   BNP (last 3 results) No results for input(s): PROBNP in the last 8760 hours. HbA1C: No results for input(s): HGBA1C in the last 72 hours. CBG: Recent Labs  Lab 02/22/18 1144 02/22/18 1706 02/22/18 2050 02/23/18 0722 02/23/18 1124  GLUCAP 214* 160* 181* 142* 147*   Lipid Profile: No results for input(s): CHOL, HDL, LDLCALC, TRIG, CHOLHDL, LDLDIRECT in the last 72 hours. Thyroid Function Tests: No results for input(s): TSH, T4TOTAL, FREET4, T3FREE, THYROIDAB in the last 72 hours. Anemia Panel: No results for input(s): VITAMINB12, FOLATE, FERRITIN, TIBC, IRON, RETICCTPCT in the last 72  hours. Urine analysis:    Component Value Date/Time   COLORURINE YELLOW 02/19/2018 1441   APPEARANCEUR CLOUDY (A) 02/19/2018 1441   LABSPEC 1.013 02/19/2018 1441   PHURINE 5.0 02/19/2018 1441   GLUCOSEU NEGATIVE 02/19/2018 1441   HGBUR LARGE (A) 02/19/2018 1441   BILIRUBINUR NEGATIVE 02/19/2018 1441   KETONESUR NEGATIVE 02/19/2018 1441   PROTEINUR 100 (A) 02/19/2018 1441   UROBILINOGEN 0.2 07/28/2014 0433   NITRITE NEGATIVE 02/19/2018 1441   LEUKOCYTESUR LARGE (A) 02/19/2018 1441   Recent Results (from the past 240 hour(s))  Gastrointestinal Panel by PCR , Stool     Status: None   Collection Time: 02/18/18 12:18 PM  Result Value Ref Range Status   Campylobacter species NOT DETECTED NOT  DETECTED Final   Plesimonas shigelloides NOT DETECTED NOT DETECTED Final   Salmonella species NOT DETECTED NOT DETECTED Final   Yersinia enterocolitica NOT DETECTED NOT DETECTED Final   Vibrio species NOT DETECTED NOT DETECTED Final   Vibrio cholerae NOT DETECTED NOT DETECTED Final   Enteroaggregative E coli (EAEC) NOT DETECTED NOT DETECTED Final   Enteropathogenic E coli (EPEC) NOT DETECTED NOT DETECTED Final   Enterotoxigenic E coli (ETEC) NOT DETECTED NOT DETECTED Final   Shiga like toxin producing E coli (STEC) NOT DETECTED NOT DETECTED Final   Shigella/Enteroinvasive E coli (EIEC) NOT DETECTED NOT DETECTED Final   Cryptosporidium NOT DETECTED NOT DETECTED Final   Cyclospora cayetanensis NOT DETECTED NOT DETECTED Final   Entamoeba histolytica NOT DETECTED NOT DETECTED Final   Giardia lamblia NOT DETECTED NOT DETECTED Final   Adenovirus F40/41 NOT DETECTED NOT DETECTED Final   Astrovirus NOT DETECTED NOT DETECTED Final   Norovirus GI/GII NOT DETECTED NOT DETECTED Final   Rotavirus A NOT DETECTED NOT DETECTED Final   Sapovirus (I, II, IV, and V) NOT DETECTED NOT DETECTED Final    Comment: Performed at [facility], [address], [place], [place] [address]      Radiology Studies: No results found.  Scheduled Meds: . Chlorhexidine Gluconate Cloth  6 each Topical Q0600  . feeding supplement (PRO-STAT SUGAR FREE 64)  30 mL Oral BID BM  . insulin aspart  0-5 Units Subcutaneous QHS  . insulin aspart  0-9 Units Subcutaneous TID WC  . insulin aspart  4 Units Subcutaneous TID WC  . insulin glargine  22 Units Subcutaneous QHS  . latanoprost  1 drop Both Eyes QHS  . mouth rinse  15 mL Mouth Rinse BID  . metoprolol tartrate  50 mg Oral BID  . multivitamin  1 tablet Oral QHS  . nystatin   Topical BID  . pantoprazole  40 mg Oral Q1200  . saccharomyces boulardii  250 mg Oral BID  . traZODone  50 mg Oral QHS  . warfarin  2 mg Oral ONCE-1800  . Warfarin - Pharmacist Dosing  Inpatient   Does not apply q1800   Continuous Infusions: . sodium chloride Stopped (02/20/18 0530)  . DAPTOmycin (CUBICIN)  IV    . ferumoxytol Stopped (02/17/18 1234)     LOS: 27 days   Time spent: 25 minutes.  Patrecia Pour, MD [facility] [email or web address] Password [email or web address] 02/23/2018, 1:13 PM

## 2018-02-23 NOTE — Progress Notes (Addendum)
PHARMACY CONSULT NOTE FOR:  OUTPATIENT  PARENTERAL ANTIBIOTIC THERAPY (OPAT)  Indication: MSSA bacteremia and TV IE Regimen: Daptomycin 850 mg (~8 mg/kg) IV every 24 hours End date: 03/12/18  IV antibiotic discharge orders are pended. To discharging provider:  please sign these orders via discharge navigator,  Select New Orders & click on the button choice - Manage This Unsigned Work.     Alycia Rossetti, PharmD, BCPS 12:40 PM

## 2018-02-23 NOTE — Progress Notes (Signed)
ANTICOAGULATION CONSULT NOTE - Follow Up Consult  Pharmacy Consult for Coumadin  Indication: atrial fibrillation   Allergies  Allergen Reactions  . Ace Inhibitors Cough  . Cefazolin     Possible cause of AKI, AIN    Patient Measurements: Height: 5\' 10"  (177.8 cm) Weight: 238 lb 1.6 oz (108 kg) IBW/kg (Calculated) : 73  Vital Signs: Temp: 98.2 F (36.8 C) (10/20 0916) Temp Source: Oral (10/20 0916) BP: 121/60 (10/20 0916) Pulse Rate: 80 (10/20 0916)  Labs: Recent Labs    02/21/18 0803 02/22/18 0330 02/23/18 0338  HGB 8.6*  --  8.3*  HCT 28.2*  --  27.5*  PLT 229  --  225  LABPROT 29.8* 33.5* 30.6*  INR 2.88 3.36 2.99  CREATININE 3.45* 3.18* 2.56*  CKTOTAL  --   --  187    Estimated Creatinine Clearance: 37.3 mL/min (A) (by C-G formula based on SCr of 2.56 mg/dL (H)).  Assessment:  62 yo M with new onset PAF on Coumadin. Coumadin started on 10/1, achieved therapeutic INR, then held for [facility] placement. Received Vitamin K 1 mg on 10/7 and 10/8.   Coumadin resumed on 02/13/18 after [facility] placement.    INR today has trended back down to therapeutic after holding the dose on 10/19 evening (INR 2.99 << 3.36, goal of 2-3), CBC low but stable. Noted variable po intake charted. Will resume with lower dose today   Goal of Therapy:  INR 2-3 Monitor platelets by anticoagulation protocol: Yes   Plan:  - Warfarin 2 mg x 1 dose at 1800 today - Will continue to monitor for any signs/symptoms of bleeding and will follow up with PT/INR in the a.m.   Thank you for allowing pharmacy to be a part of this patient's care.  Alycia Rossetti, PharmD, BCPS Clinical Pharmacist Pager: [phone] Clinical phone for 02/23/2018 from 7a-3:30p: [phone] If after 3:30p, please call main pharmacy at: [phone] Please check AMION for all [place] numbers 02/23/2018 12:25 PM

## 2018-02-24 ENCOUNTER — Inpatient Hospital Stay (HOSPITAL_COMMUNITY): Payer: Medicare Other

## 2018-02-24 ENCOUNTER — Encounter (HOSPITAL_COMMUNITY): Payer: Self-pay | Admitting: Diagnostic Radiology

## 2018-02-24 HISTORY — PX: IR REMOVAL TUN CV CATH W/O FL: IMG2289

## 2018-02-24 LAB — RENAL FUNCTION PANEL
Albumin: 2.2 g/dL — ABNORMAL LOW (ref 3.5–5.0)
Anion gap: 6 (ref 5–15)
BUN: 70 mg/dL — AB (ref 8–23)
CO2: 24 mmol/L (ref 22–32)
CREATININE: 2.25 mg/dL — AB (ref 0.61–1.24)
Calcium: 7.9 mg/dL — ABNORMAL LOW (ref 8.9–10.3)
Chloride: 110 mmol/L (ref 98–111)
GFR calc Af Amer: 34 mL/min — ABNORMAL LOW (ref >60)
GFR calc non Af Amer: 30 mL/min — ABNORMAL LOW (ref >60)
GLUCOSE: 68 mg/dL — AB (ref 70–99)
Phosphorus: 4.1 mg/dL (ref 2.5–4.6)
Potassium: 3.3 mmol/L — ABNORMAL LOW (ref 3.5–5.1)
Sodium: 140 mmol/L (ref 135–145)

## 2018-02-24 LAB — PROTIME-INR
INR: 3.08
PROTHROMBIN TIME: 31.3 s — AB (ref 11.4–15.2)

## 2018-02-24 LAB — GLUCOSE, CAPILLARY
GLUCOSE-CAPILLARY: 58 mg/dL — AB (ref 70–99)
GLUCOSE-CAPILLARY: 163 mg/dL — AB (ref 70–99)
GLUCOSE-CAPILLARY: 105 mg/dL — AB (ref 70–99)
Glucose-Capillary: 134 mg/dL — ABNORMAL HIGH (ref 70–99)

## 2018-02-24 MED ORDER — FLEET ENEMA 7-19 GM/118ML RE ENEM
1.0000 | ENEMA | Freq: Once | RECTAL | Status: AC
  Administered 2018-02-25: 1 via RECTAL
  Filled 2018-02-24: qty 1

## 2018-02-24 MED ORDER — NYSTATIN 100000 UNIT/GM EX POWD: Freq: Two times a day (BID) | CUTANEOUS | 0 refills | Status: DC

## 2018-02-24 MED ORDER — DAPTOMYCIN IV (FOR PTA / DISCHARGE USE ONLY): 700.0000 mg | INTRAVENOUS | 0 refills | Status: DC

## 2018-02-24 MED ORDER — WARFARIN SODIUM 2 MG PO TABS
2.0000 mg | ORAL_TABLET | Freq: Once | ORAL | Status: AC
  Administered 2018-02-24: 2 mg via ORAL
  Filled 2018-02-24: qty 1

## 2018-02-24 MED ORDER — HYOSCYAMINE SULFATE 0.125 MG SL SUBL
0.2500 mg | SUBLINGUAL_TABLET | Freq: Once | SUBLINGUAL | Status: AC
  Administered 2018-02-24: 0.25 mg via SUBLINGUAL
  Filled 2018-02-24: qty 2

## 2018-02-24 MED ORDER — SIMETHICONE 80 MG PO CHEW
160.0000 mg | CHEWABLE_TABLET | Freq: Four times a day (QID) | ORAL | Status: DC | PRN
  Administered 2018-02-25 (×2): 160 mg via ORAL
  Filled 2018-02-24 (×3): qty 2

## 2018-02-24 MED ORDER — OXYCODONE HCL 5 MG PO TABS: 5.0000 mg | ORAL_TABLET | Freq: Four times a day (QID) | ORAL | 0 refills | Status: DC | PRN

## 2018-02-24 MED ORDER — SACCHAROMYCES BOULARDII 250 MG PO CAPS: 250.0000 mg | ORAL_CAPSULE | Freq: Two times a day (BID) | ORAL | 0 refills | Status: AC

## 2018-02-24 MED ORDER — LIDOCAINE HCL 1 % IJ SOLN
INTRAMUSCULAR | Status: AC
  Filled 2018-02-24: qty 20

## 2018-02-24 MED ORDER — WARFARIN SODIUM 2 MG PO TABS: 2.0000 mg | ORAL_TABLET | Freq: Every day | ORAL | Status: DC

## 2018-02-24 MED ORDER — CHLORHEXIDINE GLUCONATE 4 % EX LIQD
CUTANEOUS | Status: AC
  Filled 2018-02-24: qty 15

## 2018-02-24 NOTE — Progress Notes (Signed)
Physical Therapy Treatment Patient Details Name: [name] MRN: [id number] DOB: 15-Aug-1955 Today's Date: 02/24/2018    History of Present Illness Pt is a 62 y/o male with a complicated PMH hospitalized September for sepsis secondary to right foot osteomyelitis, underwent BKA, found to have MSSA bacteremia with splenic infarct and tricuspid valve endocarditis. He was discharged to SNF and developed acute oliguria - presented to the ED where he was found to have AKI.  Developed acute respiratory failure necessitating transfer to ICU and critical care service.  Temporary dialysis catheter was placed and the patient was started on HD.  Respiratory status improved and he was transferred back out to the hospital service.    PT Comments    Attempted to don prosthesis this session however would not fit due to edema. +2 assist required for lateral transfer to drop-arm recliner. Overall good rehab effort today. Will continue to follow.    Follow Up Recommendations  SNF;Supervision/Assistance - 24 hour     Equipment Recommendations  None recommended by PT    Recommendations for Other Services Rehab consult     Precautions / Restrictions Precautions Precautions: Fall Restrictions Weight Bearing Restrictions: No RLE Weight Bearing: Non weight bearing LLE Weight Bearing: Weight bearing as tolerated Other Position/Activity Restrictions: Has a prosthesis for LLE    Mobility  Bed Mobility Overal bed mobility: Needs Assistance Bed Mobility: Supine to Sit     Supine to sit: Min assist     General bed mobility comments: Increased time and effort to transition to EOB. Pt had difficulty maintaining balance due to uneven air mattress.   Transfers Overall transfer level: Needs assistance   Transfers: Lateral/Scoot Transfers          Lateral/Scoot Transfers: Mod assist;+2 physical assistance;From elevated surface General transfer comment: From slightly elevated surface so pt  scooting "downhill". Pt required heavy +2 assist to scoot laterally into drop-arm recliner chair.  Ambulation/Gait             General Gait Details: Not assessed this session.   Stairs             Wheelchair Mobility    Modified Rankin (Stroke Patients Only)       Balance Overall balance assessment: Needs assistance Sitting-balance support: No upper extremity supported Sitting balance-Leahy Scale: Poor Sitting balance - Comments: Required UE support to maintain sitting balance - could partly be contributed to air mattress and uneven sitting surface.                                     Cognition Arousal/Alertness: Awake/alert Behavior During Therapy: WFL for tasks assessed/performed Overall Cognitive Status: Within Functional Limits for tasks assessed                                        Exercises Other Exercises Other Exercises: Reviewed HEP verbally    General Comments        Pertinent Vitals/Pain Pain Assessment: No/denies pain    Home Living                      Prior Function            PT Goals (current goals can now be found in the care plan section) Acute Rehab PT Goals Patient Stated Goal: to  get a Rt Prosthesis, be able to walk and take care of self  PT Goal Formulation: With patient Time For Goal Achievement: 03/04/18 Potential to Achieve Goals: Good Progress towards PT goals: Progressing toward goals    Frequency    Min 2X/week      PT Plan Current plan remains appropriate    Co-evaluation              AM-PAC PT "6 Clicks" Daily Activity  Outcome Measure  Difficulty turning over in bed (including adjusting bedclothes, sheets and blankets)?: Unable Difficulty moving from lying on back to sitting on the side of the bed? : Unable Difficulty sitting down on and standing up from a chair with arms (e.g., wheelchair, bedside commode, etc,.)?: Unable Help needed moving to and from a  bed to chair (including a wheelchair)?: A Lot Help needed walking in hospital room?: Total Help needed climbing 3-5 steps with a railing? : Total 6 Click Score: 7    End of Session   Activity Tolerance: Patient tolerated treatment well Patient left: in chair;with call bell/phone within reach;with chair alarm set Nurse Communication: Mobility status PT Visit Diagnosis: Other abnormalities of gait and mobility (R26.89)     Time: 7416-3845 PT Time Calculation (min) (ACUTE ONLY): 29 min  Charges:  $Therapeutic Activity: 23-37 mins                     [name], PT, DPT Acute Rehabilitation Services Pager: [phone] Office: [phone]    [name] 02/24/2018, 12:39 PM

## 2018-02-24 NOTE — Progress Notes (Signed)
Upon patient assessment of vascular line. Hemodialysis line is not present. Pt. Reported that line was removed earlier today, pressure drsg. Present to site. Fran Lowes, RN VAST

## 2018-02-24 NOTE — Discharge Summary (Signed)
Physician Discharge Summary  [name] ACZ:[id number] DOB: May 20, 1955 DOA: 01/27/2018  PCP: [name], MD  Admit date: 01/27/2018 Discharge date: 02/24/2018  Admitted From: SNF Disposition: SNF   Recommendations for Outpatient Follow-up:  1. Follow up with PCP in 1-2 weeks 2. Monitor BMP, CBC, and CK weekly as well as every other week ESR and CRP) while on daptomycin (last day of therapy 11/2) 3. Follow up with ID at the conclusion of IV antibiotics. 4. Follow up with Dr. Sharol Given 1 week post discharge for follow up following transtibial amputation. 5. Follow up with cardiology for new diagnosis AFib. Started on coumadin during renal failure, consider switch to DOAC if creatinine continues to improve. 6. Monitor INR and dose coumadin accordingly. Dosing confirmed by pharmacy, recommend '2mg'$  daily and recheck INR for dosing adjustments.  Home Health: N/A Equipment/Devices: Per SNF Discharge Condition: Stable CODE STATUS: Full Diet recommendation: Carb-modified  Brief/Interim Summary: [name] is a 62 y.o. male with a history of sepsis due to right foot osteomyelitis s/p transtibial amputation and found to have MSSA bacteremia with splenic infarct and tricuspid valve endocarditis. He was discharged on ancef to [facility] but was quickly brought back in due to oliguria and found to have acute renal failure. He developed acute respiratory failure and transferred to ICU on CCM service. Antibiotics changed to daptomycin per ID and nephrology oversaw hemodialysis through right IJ temporary catheter on 10/12 and 10/14. With improvement in respiratory status he was transferred to the medical floor. Nephrology following closely for signs of renal recovery. Creatinine trending downward beginning 10/17 with increasing urine output. Creatinine clearance has improved to 48m/min on 10/21. Per nephrology recommendations, IR was contacted to discontinue tunneled dialysis catheter prior  to discharge. Will need to continue IV antibiotics as below.  Discharge Diagnoses:  Principal Problem:   Acute renal failure (ARF) (HCC) Active Problems:   Insulin-requiring or dependent type II diabetes mellitus ([facility]   Essential hypertension   CAD (coronary artery disease)   Leukocytosis   Thrombocytopenia (HCC)   S/P BKA (below knee amputation) unilateral, right (HCC)   Endocarditis of tricuspid valve   Hyponatremia   PAF (paroxysmal atrial fibrillation) (HCC)   Aortic atherosclerosis (HCC)   Acute hypoxemic respiratory failure (HCC)  Oliguric acute renal failure: Due to ATN vs. ancef-induced AIN. Continues slow improvement, CrCl continues slow improvement. UOP up to 1500 past 24 hours. - Continue daily monitoring. Per nephrology, if continues improvement can pull temporary catheter on 10/20 and would be eligible for discharge if stable.   Paroxysmal atrial fibrillation: New diagnosis. Maintaining sinus rhythm. Cardiology consulted, signed off 10/3. - Continue coumadin (new start) at '2mg'$  daily dose. - Continue metoprolol - Follow up with Dr. [name] 3-4 weeks post discharge.  MSSA bacteremia and tricuspid valve endocarditis and splenic infarcts: Stopped ancef.  - Continue daptomycin to complete 6 weeks Tx as above. - CK transiently mildly elevated but since resolved.   Loose stools: Improving, usually postprandial and not strictly watery diarrhea. Loose stools without significantly increased frequency (one/day). No recent travel and GI pathogen panel negative. Suspect antibiotic-associated but not CDiff.  - Continue probiotics 2 weeks following conclusion of antibiotics. - Ok to give imodium prn unless develops evidence of infection.  PVD s/p bilateral transtibial amputations: Reevaluated by Dr. DSharol Givenduring this hospitalization, staples removed from recent right TTA on 10/15.  - Follow up with Dr. DSharol Givenand get prosthesis fitting after discharge.   Anemia of acute and  chronic  disease: Stable hgb.  - Got aranesp Sims 10/18. - Monitor in AM.  Loss of cortex along anterior inferior aspect of T12 vertebral body, lucency in posterior aspect of L1 vertebral body: Uncertain etiology, could be infectious vs. other.  - Consider MRI w/ and w/o contrast after duration of antibiotics is completed. ID recommended no change in therapy.   Acute hypoxic respiratory failure: Due to pulmonary edema. Resolved.   Iron deficiency anemia and anemia of acute illness:  - Has been stable in 8's. Recheck weekly. - Iron supplement  T2DM: A1c 8.5%.  - Continue lantus, SSI, trulicity weekly. With concomitant insulin and renal insufficiency, will DC glipizide.  Thrombocytopenia: Resolved.  Oral candidiasis: Treated with fluconazole  Obesity: BMI 34.  - Dietitian consulted  Discharge Instructions Discharge Instructions    Diet Carb Modified   Complete by:  As directed    Home infusion instructions [facility] May follow [place] Dosing Protocol; May administer Cathflo as needed to maintain patency of vascular access device.; Flushing of vascular access device: per [facility] Protocol: 0.9% NaCl pre/post medica...   Complete by:  As directed    Instructions:  May follow [place] Dosing Protocol   Instructions:  May administer Cathflo as needed to maintain patency of vascular access device.   Instructions:  Flushing of vascular access device: per [facility] Protocol: 0.9% NaCl pre/post medication administration and prn patency; Heparin 100 u/ml, 26m for implanted ports and Heparin 10u/ml, 516mfor all other central venous catheters.   Instructions:  May follow [facility] Anaphylaxis Protocol for First Dose Administration in the home: 0.9% NaCl at 25-50 ml/hr to maintain IV access for protocol meds. Epinephrine 0.3 ml IV/IM PRN and Benadryl 25-50 IV/IM PRN s/s of anaphylaxis.   Instructions:  AdPupukeanfusion Coordinator (RN) to assist per patient IV care needs in the  home PRN.   Increase activity slowly   Complete by:  As directed      Allergies as of 02/24/2018      Reactions   Ace Inhibitors Cough   Cefazolin    Possible cause of AKI, AIN      Medication List    STOP taking these medications   ceFAZolin  IVPB Commonly known as:  ANCEF   glipiZIDE 10 MG 24 hr tablet Commonly known as:  GLUCOTROL XL   losartan 50 MG tablet Commonly known as:  COZAAR   naproxen sodium 220 MG tablet Commonly known as:  ALEVE     TAKE these medications   aspirin 325 MG tablet Take 325 mg by mouth daily.   bisacodyl 10 MG suppository Commonly known as:  DULCOLAX Place 10 mg rectally once as needed (for constipation not relieved by Milk of Magnesia).   CALCIUM CITRATE + D3 250-200 MG-UNIT Tabs Generic drug:  Calcium Citrate-Vitamin D Take 1 tablet by mouth 2 (two) times daily.   cetirizine 10 MG tablet Commonly known as:  ZYRTEC Take 10 mg by mouth daily.   daptomycin  IVPB Commonly known as:  CUBICIN Inject 700 mg into the vein daily for 13 days. Indication:  MSSA bacteremia and TV IE Last Day of Therapy:  03/08/18 Labs - Once weekly:  CBC/D, BMP, and CPK Labs - Every other week:  ESR and CRP   insulin aspart 100 UNIT/ML injection Commonly known as:  novoLOG Before each meal 3 times a day, 140-199 - 2 units, 200-250 - 4 units, 251-299 - 6 units,  300-349 - 8 units,  350  or above 10 units. Insulin PEN if approved, provide syringes and needles if needed. What changed:    how much to take  how to take this  when to take this  additional instructions   ketoconazole 2 % cream Commonly known as:  NIZORAL Apply 1 application topically See admin instructions. Apply to both buttocks and "GL fold" rash two times a day   latanoprost 0.005 % ophthalmic solution Commonly known as:  XALATAN Place 1 drop into both eyes at bedtime.   magnesium hydroxide 400 MG/5ML suspension Commonly known as:  MILK OF MAGNESIA Take 30 mLs by mouth once as  needed for mild constipation.   metoprolol tartrate 50 MG tablet Commonly known as:  LOPRESSOR TAKE 2 TABLETS BY MOUTH EVERY MORNING AND 1 EVERY EVENING What changed:  See the new instructions.   multivitamin tablet Take 1 tablet by mouth daily.   nystatin powder Commonly known as:  MYCOSTATIN/NYSTOP Apply topically 2 (two) times daily.   oxyCODONE 5 MG immediate release tablet Commonly known as:  Oxy IR/ROXICODONE Take 1 tablet (5 mg total) by mouth every 6 (six) hours as needed (for pain). What changed:    when to take this  reasons to take this   pantoprazole 40 MG tablet Commonly known as:  PROTONIX Take 40 mg by mouth daily.   polyethylene glycol packet Commonly known as:  MIRALAX / GLYCOLAX Take 17 g by mouth daily as needed for mild constipation.   RA SALINE ENEMA 19-7 GM/118ML Enem Place 1 enema rectally once as needed (for constipation not relieved by Dulcolax suppository and notify MD if no relief from enema).   saccharomyces boulardii 250 MG capsule Commonly known as:  FLORASTOR Take 1 capsule (250 mg total) by mouth 2 (two) times daily for 25 days.   simvastatin 20 MG tablet Commonly known as:  ZOCOR Take 20 mg by mouth daily at 6 PM.   TOUJEO SOLOSTAR 300 UNIT/ML Sopn Generic drug:  Insulin Glargine Inject 30 Units into the skin See admin instructions. Use up to 100 units daily What changed:    when to take this  additional instructions   TRULICITY 1.5 MV/7.8IO Sopn Generic drug:  Dulaglutide Inject 1.5 mg into the skin every Monday.   warfarin 2 MG tablet Commonly known as:  COUMADIN Take 1 tablet (2 mg total) by mouth daily at 6 PM.            Home Infusion Instuctions  (From admission, onward)         Start     Ordered   02/24/18 0000  Home infusion instructions [facility] May follow [place] Dosing Protocol; May administer Cathflo as needed to maintain patency of vascular access device.; Flushing of vascular access  device: per [facility] Protocol: 0.9% NaCl pre/post medica...    Question Answer Comment  Instructions May follow [place] Dosing Protocol   Instructions May administer Cathflo as needed to maintain patency of vascular access device.   Instructions Flushing of vascular access device: per [facility] Protocol: 0.9% NaCl pre/post medication administration and prn patency; Heparin 100 u/ml, 60m for implanted ports and Heparin 10u/ml, 561mfor all other central venous catheters.   Instructions May follow [facility] Anaphylaxis Protocol for First Dose Administration in the home: 0.9% NaCl at 25-50 ml/hr to maintain IV access for protocol meds. Epinephrine 0.3 ml IV/IM PRN and Benadryl 25-50 IV/IM PRN s/s of anaphylaxis.   Instructions [facility] Infusion Coordinator (RN) to assist per patient  IV care needs in the home PRN.      02/24/18 1141         Follow-up Information    Newt Minion, MD Follow up in 1 week(s).   Specialty:  Orthopedic Surgery Contact information: [place] [address] [phone]        [name], MD. Schedule an appointment as soon as possible for a visit in 2 week(s).   Specialties:  Optician, dispensing, Emergency Medicine Contact information: [facility] [place] [address] [phone]        [facility]             . Schedule an appointment as soon as possible for a visit.   Why:  at the conclusion of IV antibiotics Contact information: [place] [address]         Allergies  Allergen Reactions  . Ace Inhibitors Cough  . Cefazolin     Possible cause of AKI, AIN    Consultations:  ID  IR  Cardiology  PCCM  Nephrology  Pharmacy  Procedures/Studies: Dg Abd 1 View  Result Date: 01/28/2018 CLINICAL DATA:  Abdominal pain EXAM: ABDOMEN - 1 VIEW COMPARISON:  CT abdomen pelvis of 01/14/2018 FINDINGS: A supine film of the abdomen shows the  lumbar spine to be somewhat rotated to the right. No bowel obstruction is seen. However there is paucity of bowel gas in the left abdomen of doubtful significance in view of the recent CT scan showing no mass. Calcified vas deferens are noted in the pelvis. IMPRESSION: 1. No bowel obstruction. 2. Paucity of bowel gas in the left abdomen of doubtful significance. 3. Calcified vas deferens. Electronically Signed   By: Ivar Drape M.D.   On: 01/28/2018 11:24   US Renal  Result Date: 01/27/2018 CLINICAL DATA:  62 year old male with acute renal failure. EXAM: RENAL / URINARY TRACT ULTRASOUND COMPLETE COMPARISON:  CT of abdomen pelvis dated 01/14/2018 FINDINGS: Right Kidney: Length: 12.2 cm. Normal echogenicity. No hydronephrosis or shadowing stone. Left Kidney: Length: 11.7 cm. Normal echogenicity. No hydronephrosis or shadowing stone. Bladder: There is layering debris in the urinary bladder. Correlation with urinalysis recommended. The liver is slightly echogenic likely fatty infiltration. There is probable Riedel's lobe anatomy. IMPRESSION: 1. No hydronephrosis or shadowing stone. 2. Layering debris within the urinary bladder. Correlation with urinalysis recommended. Electronically Signed   By: Anner Crete M.D.   On: 01/27/2018 22:38   Ir Fluoro Guide Cv Line Right  Result Date: 02/13/2018 INDICATION: 62 year old male with end-stage renal disease on hemodialysis. He currently has a right IJ approach non tunneled hemodialysis catheter and requires a tunneled hemodialysis catheter. EXAM: TUNNELED CENTRAL VENOUS HEMODIALYSIS CATHETER PLACEMENT WITH ULTRASOUND AND FLUOROSCOPIC GUIDANCE MEDICATIONS: 2 g Ancef. The antibiotic was given in an appropriate time interval prior to skin puncture. ANESTHESIA/SEDATION: Moderate (conscious) sedation was employed during this procedure. A total of Versed 1.5 mg and Fentanyl 75 mcg was administered intravenously. Moderate Sedation Time: 17 minutes. The patient's level of  consciousness and vital signs were monitored continuously by radiology nursing throughout the procedure under my direct supervision. FLUOROSCOPY TIME:  Fluoroscopy Time: 0 minutes 30 seconds (6 mGy). COMPLICATIONS: None immediate. PROCEDURE: Informed written consent was obtained from the patient after a discussion of the risks, benefits, and alternatives to treatment. Questions regarding the procedure were encouraged and answered. The right neck and chest were prepped with chlorhexidine in a sterile  fashion, and a sterile drape was applied covering the operative field. Maximum barrier sterile technique with sterile gowns and gloves were used for the procedure. A timeout was performed prior to the initiation of the procedure. After creating a small venotomy incision, a micropuncture kit was utilized to access the right internal jugular vein under direct, real-time ultrasound guidance after the overlying soft tissues were anesthetized with 1% lidocaine with epinephrine. Ultrasound image documentation was performed. The microwire was kinked to measure appropriate catheter length. A stiff Glidewire was advanced to the level of the IVC and the micropuncture sheath was exchanged for a peel-away sheath. A Palindrome tunneled hemodialysis catheter measuring 19 cm from tip to cuff was tunneled in a retrograde fashion from the anterior chest wall to the venotomy incision. The catheter was then placed through the peel-away sheath with tips ultimately positioned within the superior aspect of the right atrium. Final catheter positioning was confirmed and documented with a spot radiographic image. The catheter aspirates and flushes normally. The catheter was flushed with appropriate volume heparin dwells. The catheter exit site was secured with a 0-Prolene retention suture. The venotomy incision was closed with an interrupted 4-0 Vicryl, Dermabond and Steri-strips. Dressings were applied. The patient tolerated the procedure well  without immediate post procedural complication. IMPRESSION: Successful placement of 19 cm tip to cuff tunneled hemodialysis catheter via the right internal jugular vein with tips terminating within the superior aspect of the right atrium. The catheter is ready for immediate use. Electronically Signed   By: Jacqulynn Cadet M.D.   On: 02/13/2018 16:17   Ir US Guide Vasc Access Right  Result Date: 02/13/2018 INDICATION: 62 year old male with end-stage renal disease on hemodialysis. He currently has a right IJ approach non tunneled hemodialysis catheter and requires a tunneled hemodialysis catheter. EXAM: TUNNELED CENTRAL VENOUS HEMODIALYSIS CATHETER PLACEMENT WITH ULTRASOUND AND FLUOROSCOPIC GUIDANCE MEDICATIONS: 2 g Ancef. The antibiotic was given in an appropriate time interval prior to skin puncture. ANESTHESIA/SEDATION: Moderate (conscious) sedation was employed during this procedure. A total of Versed 1.5 mg and Fentanyl 75 mcg was administered intravenously. Moderate Sedation Time: 17 minutes. The patient's level of consciousness and vital signs were monitored continuously by radiology nursing throughout the procedure under my direct supervision. FLUOROSCOPY TIME:  Fluoroscopy Time: 0 minutes 30 seconds (6 mGy). COMPLICATIONS: None immediate. PROCEDURE: Informed written consent was obtained from the patient after a discussion of the risks, benefits, and alternatives to treatment. Questions regarding the procedure were encouraged and answered. The right neck and chest were prepped with chlorhexidine in a sterile fashion, and a sterile drape was applied covering the operative field. Maximum barrier sterile technique with sterile gowns and gloves were used for the procedure. A timeout was performed prior to the initiation of the procedure. After creating a small venotomy incision, a micropuncture kit was utilized to access the right internal jugular vein under direct, real-time ultrasound guidance after the  overlying soft tissues were anesthetized with 1% lidocaine with epinephrine. Ultrasound image documentation was performed. The microwire was kinked to measure appropriate catheter length. A stiff Glidewire was advanced to the level of the IVC and the micropuncture sheath was exchanged for a peel-away sheath. A Palindrome tunneled hemodialysis catheter measuring 19 cm from tip to cuff was tunneled in a retrograde fashion from the anterior chest wall to the venotomy incision. The catheter was then placed through the peel-away sheath with tips ultimately positioned within the superior aspect of the right atrium. Final catheter positioning was confirmed and  documented with a spot radiographic image. The catheter aspirates and flushes normally. The catheter was flushed with appropriate volume heparin dwells. The catheter exit site was secured with a 0-Prolene retention suture. The venotomy incision was closed with an interrupted 4-0 Vicryl, Dermabond and Steri-strips. Dressings were applied. The patient tolerated the procedure well without immediate post procedural complication. IMPRESSION: Successful placement of 19 cm tip to cuff tunneled hemodialysis catheter via the right internal jugular vein with tips terminating within the superior aspect of the right atrium. The catheter is ready for immediate use. Electronically Signed   By: Jacqulynn Cadet M.D.   On: 02/13/2018 16:17   Dg Chest Port 1 View  Result Date: 02/10/2018 CLINICAL DATA:  Short of breath. EXAM: PORTABLE CHEST 1 VIEW COMPARISON:  02/10/2018 at 5:34 a.m. FINDINGS: Hazy central and lower lung airspace opacities have mildly improved. Interstitial thickening has improved. Small pleural effusions appear decreased since the prior exam. Right internal jugular central venous line and right PICC are stable. There stable changes from cardiac surgery. IMPRESSION: 1. Interval improvement in congestive heart failure and pulmonary edema. There is still mild  residual airspace edema and small effusions. No new abnormalities. Electronically Signed   By: Lajean Manes M.D.   On: 02/10/2018 20:59   Dg Chest Port 1 View  Result Date: 02/10/2018 CLINICAL DATA:  Shortness of breath. EXAM: PORTABLE CHEST 1 VIEW COMPARISON:  01/31/2018 FINDINGS: Right dialysis catheter tip in the mid SVC. Right upper extremity PICC tip in the mid distal SVC. Element of bilateral perihilar opacities and Kerley B-lines consistent with pulmonary edema. Increased bilateral pleural effusions. Cardiomegaly is unchanged. Prosthetic aortic valve. No pneumothorax. IMPRESSION: CHF with moderate pulmonary edema and pleural effusions. Cardiomegaly appears similar. Electronically Signed   By: Keith Rake M.D.   On: 02/10/2018 05:50   Dg Chest Port 1 View  Result Date: 01/31/2018 CLINICAL DATA:  Acute respiratory failure EXAM: PORTABLE CHEST 1 VIEW COMPARISON:  01/29/2018 FINDINGS: Cardiac shadow is stable. Postoperative changes are seen. Right jugular central line is noted in satisfactory position. The right-sided PICC line is also noted in stable position. The lungs are well aerated with minimal left basilar atelectasis. No pneumothorax is seen. No bony abnormality is noted. IMPRESSION: Mild left basilar atelectasis. Tubes and lines as described. Electronically Signed   By: Inez Catalina M.D.   On: 01/31/2018 08:12   Dg Chest Port 1 View  Result Date: 01/29/2018 CLINICAL DATA:  Severe shortness of breath. EXAM: PORTABLE CHEST 1 VIEW COMPARISON:  01/27/2018. FINDINGS: Stable enlarged cardiac silhouette, post CABG changes and prosthetic aortic valve. Interval right jugular catheter with its tip in the superior vena cava. No pneumothorax. Stable right PICC with its tip in the inferior aspect of the superior vena cava. Increased prominence of the interstitial markings, including Kerley lines with an interval small left pleural effusion. Stable left lung postsurgical changes. No acute bony  abnormality. IMPRESSION: Interval changes of congestive heart failure with stable cardiomegaly. Electronically Signed   By: Claudie Revering M.D.   On: 01/29/2018 16:24   Dg Chest Portable 1 View  Result Date: 01/27/2018 CLINICAL DATA:  Intermittent LEFT chest pain.  Oliguric. EXAM: PORTABLE CHEST 1 VIEW COMPARISON:  Chest radiograph January 15, 2018 FINDINGS: RIGHT PICC distal tip projects in distal superior vena cava. No pneumothorax. Stable mild cardiomegaly. Status post aortic valve replacement. LEFT mid lung zone bandlike density with mildly elevated LEFT hemidiaphragm. No focal consolidation. Blunting of the LEFT costophrenic angle. No pneumothorax. Soft  tissue planes and included osseous structures are non suspicious. Surgical clips bilateral axilla. IMPRESSION: 1. RIGHT PICC distal tip projecting in distal superior vena cava. No pneumothorax. 2. LEFT lung base atelectasis with small pleural effusion versus pleural thickening. 3. Stable cardiomegaly. Electronically Signed   By: Elon Alas M.D.   On: 01/27/2018 18:43   Dg Abd 2 Views  Result Date: 02/03/2018 CLINICAL DATA:  Abdominal pain and distension EXAM: ABDOMEN - 2 VIEW COMPARISON:  January 28, 2018 FINDINGS: Supine and left lateral decubitus images were obtained. There is moderate stool throughout colon. There is no appreciable bowel dilatation or air-fluid levels to suggest bowel obstruction. No evident free air. There are vascular calcifications in the pelvis. Visualized lung bases are clear. IMPRESSION: Moderate stool in colon. No evident bowel obstruction or free air. Visualized lung bases clear. Electronically Signed   By: Lowella Grip III M.D.   On: 02/03/2018 10:06   Dg Abd Portable 1v  Result Date: 02/11/2018 CLINICAL DATA:  RIGHT LOWER QUADRANT abdominal pain and tenderness. EXAM: PORTABLE ABDOMEN - 1 VIEW COMPARISON:  02/03/2018, 01/28/2018. CT abdomen and pelvis 01/29/2018. FINDINGS: Bowel gas pattern unremarkable  without evidence of obstruction or significant ileus. No acute abdominal abnormality. Stool burden in the colon. No visible opaque urinary tract calculi. Degenerative changes involving the visualized thoracic and lumbar spine. IMPRESSION: Negative. Electronically Signed   By: Evangeline Dakin M.D.   On: 02/11/2018 13:40   Ct Renal Stone Study  Result Date: 01/29/2018 CLINICAL DATA:  anuria EXAM: CT ABDOMEN AND PELVIS WITHOUT CONTRAST TECHNIQUE: Multidetector CT imaging of the abdomen and pelvis was performed following the standard protocol without oral or IV contrast. COMPARISON:  January 14, 2018 FINDINGS: Lower chest: There are pleural effusions bilaterally with consolidation in the posterior lung bases. There are foci of coronary artery calcification. There is a small hiatal hernia. Hepatobiliary: There is hepatic steatosis. No focal liver lesions are appreciable. Gallbladder is mildly distended. There are gallstones present. There may also be sludge in the gallbladder. There is no appreciable gallbladder wall thickening. There is no pericholecystic fluid. Pancreas: There is fatty infiltration in the pancreas. No focal pancreatic lesions are evident. Spleen: There is decreased attenuation in the posterior spleen in a wedge-shaped configuration consistent with splenic infarct, felt to be stable from most recent study. No new splenic lesions are evident on this noncontrast enhanced study. Adrenals/Urinary Tract: Adrenals bilaterally appear normal. There is no evident renal mass or hydronephrosis on either side. There is no appreciable renal or ureteral calculus on either side. There is a Foley catheter within the urinary bladder. There is wall thickening in portions of the urinary bladder anteriorly. Air within the urinary bladder may well be iatrogenic given recent catheterization. Stomach/Bowel: There is no appreciable bowel wall or mesenteric thickening. No evident bowel obstruction. There is no  appreciable free air or portal venous air. Vascular/Lymphatic: There is aortic atherosclerosis. There is also atherosclerotic calcification in the common iliac arteries. No aneurysm evident. Major mesenteric arterial vessels are felt to be patent, although there are foci of atherosclerotic calcification in mesenteric vessels. There is no appreciable adenopathy in the abdomen or pelvis. Reproductive: Prostate and seminal vesicles are normal in size and contour. There is calcification in the seminal vesicles. No pelvic masses evident. Other: There is ascites in the abdomen and pelvis with areas of apparent loculation of ascites in the pelvis. No abscess is evident in the abdomen or pelvis. Appendix appears unremarkable. There is mesenteric thickening in the  left lower abdomen, near in area of loculated ascites. There is generalized anasarca. Musculoskeletal: Degenerative change throughout the lower thoracic and lumbar regions remain stable. There is loss of cortex with soft tissue thickening along the anterior, inferior aspect of T12 with loss of disc space in this area. There is also lucency along the posterior aspect of the L1 vertebral body, stable. A degree of discitis/osteomyelitis in this area must be of concern. No intramuscular lesions are evident. IMPRESSION: 1. Loss of cortex along the anterior inferior aspect of the T12 vertebral body as well as lucency in the posterior aspect of the L1 vertebral body. Question a degree of discitis/osteomyelitis in this region. This finding may warrant MR pre and post-contrast to further evaluate with respect to discitis/bony destruction. 2. Generalized anasarca. Areas of partially loculated ascites as well as mild mesenteric thickening in the left lower quadrant. No evident abscess in the abdomen or pelvis. 3. Cholelithiasis with probable sludge in the gallbladder as well. Gallbladder mildly distended. Gallbladder wall not thickened. 4. No evident hydronephrosis on either  side. No renal or ureteral calculus on either side. 5. Urinary bladder decompressed. Air within the urinary bladder likely is due to recent catheterization. Correlation with urinalysis to assess for possible gas-forming organism is a cause for air within the bladder is advised. 6.  No bowel obstruction.  Appendix region appears normal. 7.  Small hiatal hernia. 8.  Aortoiliac atherosclerosis.  Coronary artery calcification. 9.  Bilateral pleural effusions with bibasilar consolidation. 10. 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 Aortic Atherosclerosis (ICD10-I70.0). Electronically Signed   By: Lowella Grip III M.D.   On: 01/29/2018 07:08     Temporary HD catheter  HD  Subjective: No fever, chills, dyspnea, chest pain, abd pain. Tolerating diet, enjoying lifting of renal restrictions. No BM today, last was yesterday morning. Reluctant to leave hospital but eager to start PT. No other complaints.  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 Right with staples removed, no dehiscence tenderness or erythema. No edema, no cyanosis  Labs: BNP (last 3 results) No results for input(s): BNP in the last 8760 hours. Basic Metabolic Panel: Recent Labs  Lab 02/20/18 1327 02/21/18 0803 02/22/18 0330 02/23/18 0338 02/24/18 0336  NA 136 136 136 139 140  K 3.8 3.9 3.6 3.5 3.3*  CL 103 104 103 106 110  CO2 _0 GLUCOSE 138* 179* 130* 139* 68*  BUN 67* 74* 77* 77* 70*  CREATININE 3.91* 3.45* 3.18* 2.56* 2.25*  CALCIUM 7.7* 7.8* 7.6* 7.8* 7.9*  PHOS 4.8* 5.4*  5.2* 4.7* 4.1   Liver Function Tests: Recent Labs  Lab 02/20/18 1327 02/21/18 0803 02/22/18 0330 02/23/18 0338 02/24/18 0336  ALBUMIN 2.2* 2.2* 2.1* 2.2* 2.2*   No results for input(s): LIPASE, AMYLASE in the last 168 hours. No results for input(s): AMMONIA in the last 168 hours. CBC: Recent Labs  Lab 02/21/18 0803 02/23/18 0338  WBC 9.8 9.5  HGB 8.6* 8.3*  HCT 28.2* 27.5*  MCV 91.0 90.8  PLT 229 225  Cardiac Enzymes: Recent Labs  Lab 02/20/18 0332 02/23/18 0338  CKTOTAL 429* 187   BNP: Invalid input(s): POCBNP CBG: Recent Labs  Lab 02/23/18 1124 02/23/18 1630 02/23/18 2114 02/24/18 0756 02/24/18 1127  GLUCAP 147* 185* 174* 58* 163*   D-Dimer No results for input(s): DDIMER in the last 72 hours. Hgb A1c No results for input(s): HGBA1C in the last 72 hours. Lipid Profile No results for input(s): CHOL, HDL, LDLCALC, TRIG, CHOLHDL, LDLDIRECT in the last 72 hours. Thyroid function studies No results for input(s): TSH, T4TOTAL, T3FREE, THYROIDAB in the last 72 hours.  Invalid input(s): FREET3 Anemia work up No results for input(s): VITAMINB12, FOLATE, FERRITIN, TIBC, IRON, RETICCTPCT in the last 72 hours. Urinalysis    Component Value Date/Time   COLORURINE YELLOW 02/19/2018 1441   APPEARANCEUR CLOUDY (A) 02/19/2018 1441   LABSPEC 1.013 02/19/2018 1441   PHURINE 5.0 02/19/2018 1441   GLUCOSEU NEGATIVE 02/19/2018 1441   HGBUR LARGE (A) 02/19/2018 1441   BILIRUBINUR NEGATIVE 02/19/2018 1441   KETONESUR NEGATIVE 02/19/2018 1441   PROTEINUR 100 (A) 02/19/2018 1441   UROBILINOGEN 0.2 07/28/2014 0433   NITRITE NEGATIVE 02/19/2018 1441   LEUKOCYTESUR LARGE (A) 02/19/2018 1441    Microbiology Recent Results (from the past 240 hour(s))  Gastrointestinal Panel by PCR , Stool     Status: None   Collection Time: 02/18/18 12:18 PM  Result Value Ref Range Status   Campylobacter species NOT DETECTED NOT DETECTED Final   Plesimonas shigelloides NOT DETECTED  NOT DETECTED Final   Salmonella species NOT DETECTED NOT DETECTED Final   Yersinia enterocolitica NOT DETECTED NOT DETECTED Final   Vibrio species NOT DETECTED NOT DETECTED Final   Vibrio cholerae NOT DETECTED NOT DETECTED Final   Enteroaggregative E coli (EAEC) NOT DETECTED NOT DETECTED Final   Enteropathogenic E coli (EPEC) NOT DETECTED NOT DETECTED Final   Enterotoxigenic E coli (ETEC) NOT DETECTED NOT DETECTED Final   Shiga like toxin producing E coli (STEC) NOT DETECTED NOT DETECTED Final   Shigella/Enteroinvasive E coli (EIEC) NOT DETECTED NOT DETECTED Final   Cryptosporidium NOT DETECTED NOT DETECTED Final   Cyclospora cayetanensis NOT DETECTED NOT DETECTED Final   Entamoeba histolytica NOT DETECTED NOT DETECTED Final   Giardia lamblia NOT DETECTED NOT DETECTED Final   Adenovirus F40/41 NOT DETECTED NOT DETECTED Final   Astrovirus NOT DETECTED NOT DETECTED Final   Norovirus GI/GII NOT DETECTED NOT DETECTED Final   Rotavirus A NOT DETECTED NOT DETECTED Final   Sapovirus (I, II, IV, and V) NOT DETECTED NOT DETECTED Final    Comment: Performed at [facility], [address], [place], [place] [address]    Time coordinating discharge: Approximately 40 minutes  Patrecia Pour, MD  [facility] 02/24/2018, 12:20 PM Pager [phone]

## 2018-02-24 NOTE — Progress Notes (Signed)
ANTICOAGULATION CONSULT NOTE - Follow Up Consult  Pharmacy Consult for Coumadin  Indication: atrial fibrillation   Allergies  Allergen Reactions  . Ace Inhibitors Cough  . Cefazolin     Possible cause of AKI, AIN    Patient Measurements: Height: 5\' 10"  (177.8 cm) Weight: 238 lb 1.6 oz (108 kg) IBW/kg (Calculated) : 73  Vital Signs: Temp: 98.2 F (36.8 C) (10/21 0744) Temp Source: Oral (10/21 0744) BP: 145/65 (10/21 0744) Pulse Rate: 82 (10/21 0744)  Labs: Recent Labs    02/22/18 0330 02/23/18 0338 02/24/18 0336  HGB  --  8.3*  --   HCT  --  27.5*  --   PLT  --  225  --   LABPROT 33.5* 30.6* 31.3*  INR 3.36 2.99 3.08  CREATININE 3.18* 2.56* 2.25*  CKTOTAL  --  187  --     Estimated Creatinine Clearance: 42.4 mL/min (A) (by C-G formula based on SCr of 2.25 mg/dL (H)).  Assessment:  62 yo M with new onset PAF on Coumadin. Coumadin started on 10/1, achieved therapeutic INR, then held for [facility] placement. Received Vitamin K 1 mg on 10/7 and 10/8.   Coumadin resumed on 02/13/18 after [facility] placement.    INR slightly supratherapeutic at 3.08, has been up to 3.36 (likely d/t 5mg  doses), anticipate further decline with held dose on 10/19.   CBC stable and no bleeding noted at this time.  PO intake also variable.  Goal of Therapy:  INR 2-3 Monitor platelets by anticoagulation protocol: Yes   Plan:  Warfarin 2mg  PO x 1 Daily INR, CBC, s/s bleeding  Bertis Ruddy, PharmD Clinical Pharmacist Please check AMION for all [place] numbers 02/24/2018 9:24 AM

## 2018-02-24 NOTE — Clinical Social Work Note (Signed)
CSW confirmed with patient earlier today that [facility] is the chosen facility. Contact made with Benancio Deeds, admissions director at [facility] and she will initiate authorization with patient's insurance - [employer] Other.CSW noted that OT needs to see patient again and nurse advised to request another OT order as this is needed for insurance authorization. CSW will continue to follow, provide SW intervention services as needed and facilitate discharge to [place] once authorization received.  Stacee Earp Givens, MSW, LCSW Licensed Clinical Social Worker [place] [phone]

## 2018-02-24 NOTE — Procedures (Signed)
Removal of right chest tunneled dialysis catheter.  Retention suture removed.  Heparin aspirated.  Catheter easily removed with manual traction.  Bandage placed over old catheter site.  Minimal bleeding.  No immediate complication.

## 2018-02-25 ENCOUNTER — Inpatient Hospital Stay (HOSPITAL_COMMUNITY): Payer: Medicare Other

## 2018-02-25 LAB — GLUCOSE, CAPILLARY
GLUCOSE-CAPILLARY: 232 mg/dL — AB (ref 70–99)
GLUCOSE-CAPILLARY: 147 mg/dL — AB (ref 70–99)
Glucose-Capillary: 208 mg/dL — ABNORMAL HIGH (ref 70–99)
Glucose-Capillary: 175 mg/dL — ABNORMAL HIGH (ref 70–99)

## 2018-02-25 LAB — HEPATIC FUNCTION PANEL
ALT: 23 U/L (ref 0–44)
AST: 21 U/L (ref 15–41)
Albumin: 2.3 g/dL — ABNORMAL LOW (ref 3.5–5.0)
Alkaline Phosphatase: 140 U/L — ABNORMAL HIGH (ref 38–126)
BILIRUBIN INDIRECT: 0.9 mg/dL (ref 0.3–0.9)
BILIRUBIN TOTAL: 1.2 mg/dL (ref 0.3–1.2)
Bilirubin, Direct: 0.3 mg/dL — ABNORMAL HIGH (ref 0.0–0.2)
Total Protein: 6.1 g/dL — ABNORMAL LOW (ref 6.5–8.1)

## 2018-02-25 LAB — RENAL FUNCTION PANEL
ALBUMIN: 2.4 g/dL — AB (ref 3.5–5.0)
Anion gap: 10 (ref 5–15)
BUN: 65 mg/dL — ABNORMAL HIGH (ref 8–23)
CALCIUM: 8.1 mg/dL — AB (ref 8.9–10.3)
CO2: 21 mmol/L — ABNORMAL LOW (ref 22–32)
CREATININE: 2.45 mg/dL — AB (ref 0.61–1.24)
Chloride: 105 mmol/L (ref 98–111)
GFR, EST AFRICAN AMERICAN: 31 mL/min — AB (ref >60)
GFR, EST NON AFRICAN AMERICAN: 27 mL/min — AB (ref >60)
Glucose, Bld: 231 mg/dL — ABNORMAL HIGH (ref 70–99)
Phosphorus: 3.8 mg/dL (ref 2.5–4.6)
Potassium: 4.2 mmol/L (ref 3.5–5.1)
SODIUM: 136 mmol/L (ref 135–145)

## 2018-02-25 LAB — PROTIME-INR
INR: 3.25
Prothrombin Time: 32.7 seconds — ABNORMAL HIGH (ref 11.4–15.2)

## 2018-02-25 MED ORDER — HYDROMORPHONE HCL 1 MG/ML IJ SOLN
0.5000 mg | INTRAMUSCULAR | Status: DC | PRN
  Administered 2018-02-25 – 2018-02-28 (×2): 1 mg via INTRAVENOUS
  Filled 2018-02-25 (×2): qty 1

## 2018-02-25 NOTE — Progress Notes (Signed)
ANTICOAGULATION CONSULT NOTE - Follow Up Consult  Pharmacy Consult for Coumadin  Indication: atrial fibrillation   Allergies  Allergen Reactions  . Ace Inhibitors Cough  . Cefazolin     Possible cause of AKI, AIN    Patient Measurements: Height: 5\' 10"  (177.8 cm) Weight: 238 lb 1.6 oz (108 kg) IBW/kg (Calculated) : 73  Vital Signs: Temp: 97.8 F (36.6 C) (10/22 0808) Temp Source: Oral (10/22 0808) BP: 161/83 (10/22 0808) Pulse Rate: 89 (10/22 0808)  Labs: Recent Labs    02/23/18 0338 02/24/18 0336 02/25/18 0335  HGB 8.3*  --   --   HCT 27.5*  --   --   PLT 225  --   --   LABPROT 30.6* 31.3* 32.7*  INR 2.99 3.08 3.25  CREATININE 2.56* 2.25* 2.45*  CKTOTAL 187  --   --     Estimated Creatinine Clearance: 39 mL/min (A) (by C-G formula based on SCr of 2.45 mg/dL (H)).  Assessment:  62 yo M with new onset PAF on Coumadin. Coumadin started on 10/1, achieved therapeutic INR, then held for [facility] placement. Received Vitamin K 1 mg on 10/7 and 10/8.   Coumadin resumed on 02/13/18 after [facility] placement.    INR = 3.25 today  Goal of Therapy:  INR 2-3 Monitor platelets by anticoagulation protocol: Yes   Plan:  Hold Warfarin today Daily INR, CBC, s/s bleeding  Thank you Anette Guarneri, PharmD Clinical Pharmacist Please check AMION for all [place] numbers 02/25/2018 9:16 AM

## 2018-02-25 NOTE — Progress Notes (Signed)
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 He was discharged to SNF and developed acute oliguria - presented to the ED where he was found to have AKI.  Developed acute respiratory failure necessitating transfer to ICU and critical care service.  Temporary dialysis catheter was placed and the patient was started on HD.  Respiratory status improved and he was transferred back out to the hospital service.   OT comments  Pt presents supine in bed, pleasant but limited session as pt reports not feeling well today. Pt able to perform simple grooming ADLs at bed level with setup assist. Pt fatigued with activity but feel he will participate well with therapies once feeling better. Feel SNF recommendation remains appropriate at this time. Will continue to follow while he remains in acute setting to progress pt towards established OT goals.   Follow Up Recommendations  SNF;Supervision/Assistance - 24 hour    Equipment Recommendations  None recommended by OT          Precautions / Restrictions Precautions Precautions: Fall Restrictions Weight Bearing Restrictions: No RLE Weight Bearing: Non weight bearing LLE Weight Bearing: Weight bearing as tolerated Other Position/Activity Restrictions: Has a prosthesis for LLE       Mobility Bed Mobility               General bed mobility comments: pt declining bed mobility at this time due to not feeling well                                                                   ADL either performed or assessed with clinical judgement   ADL Overall ADL's : Needs  assistance/impaired Eating/Feeding: Modified independent;Sitting   Grooming: Wash/dry face;Set up;Bed level                                 General ADL Comments: pt reports not feeling well since last night so limited treatment session to bed level                       Cognition Arousal/Alertness: Awake/alert Behavior During Therapy: WFL for tasks assessed/performed Overall Cognitive Status: Within Functional Limits for tasks assessed                                                            Pertinent Vitals/ Pain       Pain Assessment: Faces Faces Pain Scale: Hurts little more Pain Location: generalized Pain Descriptors / Indicators: Discomfort Pain Intervention(s): Monitored during session;Limited activity within patient's tolerance  Home Living  Frequency  Min 2X/week        Progress Toward Goals  OT Goals(current goals can now be found in the care plan section)  Progress towards OT goals: OT to reassess next treatment  Acute Rehab OT Goals Patient Stated Goal: to feel better OT Goal Formulation: With patient Time For Goal Achievement: 03/06/18 Potential to Achieve Goals: Good ADL Goals Pt Will Perform Upper Body Bathing: with supervision Pt Will Perform Lower Body Bathing: with supervision;bed level;sitting/lateral leans Pt Will Perform Upper Body Dressing: with supervision;sitting Pt Will Perform Lower Body Dressing: with supervision;sitting/lateral leans;bed level Pt Will Transfer to Toilet: with min assist;anterior/posterior transfer;bedside commode Pt Will Perform Toileting - Clothing Manipulation and hygiene: with supervision;sitting/lateral leans  Plan Discharge plan remains appropriate    Co-evaluation                 AM-PAC PT "6 Clicks" Daily Activity     Outcome Measure   Help from another person eating meals?:  None Help from another person taking care of personal grooming?: A Little Help from another person toileting, which includes using toliet, bedpan, or urinal?: A Lot Help from another person bathing (including washing, rinsing, drying)?: A Lot Help from another person to put on and taking off regular upper body clothing?: A Lot Help from another person to put on and taking off regular lower body clothing?: A Lot 6 Click Score: 15    End of Session    OT Visit Diagnosis: Muscle weakness (generalized) (M62.81)   Activity Tolerance Patient limited by fatigue(limited due to not feeling well)   Patient Left in bed;with call bell/phone within reach   Nurse Communication Mobility status        Time: 3007-6226 OT Time Calculation (min): 11 min  Charges: OT General Charges $OT Visit: 1 Visit OT Treatments $Self Care/Home Management : 8-22 mins  Lou Cal, OT Supplemental Rehabilitation Services Pager [phone] Office [phone]    [name] 02/25/2018, 9:05 AM

## 2018-02-25 NOTE — Consult Note (Signed)
[name] [birth year]  [id number].    Requesting MD: Dr. 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 Sharol Given on 01-17-18.  He is on daptomycin for this.  Since admission he has developed ARF requiring HD, felt secondary to Ancef.  He has since come off of his HD and catheter removed yesterday.  He also has atrial fibrillation for which he is on Coumadin with a current INR of 3.5. Last night he developed diffuse abdominal pain along with nausea and dry heaving.  He has had some intermittent diarrhea but was given Imodium for this.  He then developed constipation and had an enema last night with minimal result.  His abdominal pain is improved today but still with dry heaving.  He states the pain is dull and achy mostly but occasionally sharp with specific movements.  A CT scan was ordered which revealed increased bilateral pleural effusions, anasarca, and ascites.  He was noted to have a distended gallbladder with gallstones raising concern for possible acute cholecystitis.  We have been asked to evaluate the patient for further recommendations.  ROS: ROS: Please see HPI, otherwise all other systems are essentially negative currently.  He does state this pain is similar to abdominal pain and nausea he had when he came in and needed dialysis.  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  He has a prosthesis for his left stump.  He currently denies chest pain, shortness of breath, or trouble peeing.  Family History  Problem Relation Age of Onset  . Heart attack Father        early age  . Heart disease Father   . Stroke Father   . Heart attack Brother        At age 32  . Stroke Brother   . Diabetes type II Mother   . Diabetes type II Sister   . Diabetes Unknown   . Coronary artery disease Unknown     Past  Medical History:  Diagnosis Date  . Bicuspid aortic valve    a. 02/2009 s/p tissue AVR;  b. 07/2014 Echo: EF 55-60%, basal-mid inf HK, mild AS/MR, mod dil LA, PASP 44 mmHg.  Marland Kitchen CAD (coronary artery disease) October 2010   a. 02/2009 NSTEMI/Cath: 3VD; b. 02/2009 CABG x 5: LIMA->LAD, VG->Diag, VG->RI, VG->RVM->RPDA.  Marland Kitchen GERD (gastroesophageal reflux disease)   . Heart murmur   . Hyperlipidemia   . Hypertension   . Myocardial infarction ([facility]) 2010  . PVD (peripheral vascular disease) ([place])    a. s/p toe amputations on L foot.  . Type II diabetes mellitus ([place])     Past Surgical History:  Procedure Laterality Date  . AMPUTATION Left 2011   great and second toes  . AMPUTATION  07/27/2011   Procedure: AMPUTATION FOOT;  Surgeon: Newt Minion, MD;  Location: [facility];  Service: Orthopedics;  Laterality: Left;  Left Midfoot Amputation   . AMPUTATION Left 12/08/2014   Procedure: Left Below Knee Amputation;  Surgeon: Newt Minion, MD;  Location: [facility];  Service: Orthopedics;  Laterality: Left;  . AMPUTATION Right 01/17/2018   Procedure: RIGHT BELOW KNEE AMPUTATION;  Surgeon: Newt Minion, MD;  Location: [facility];  Service: Orthopedics;  Laterality: Right;  . AMPUTATION TOE Right    "all my toes on my right  foot have been amputated"  . AORTIC VALVE REPLACEMENT  2010   Pericardial tissue valve  . CARDIAC CATHETERIZATION  2010  . CARDIAC VALVE REPLACEMENT    . CATARACT EXTRACTION W/ INTRAOCULAR LENS  IMPLANT, BILATERAL Bilateral   . CORONARY ARTERY BYPASS GRAFT  2010   "CABG X5"  . IR FLUORO GUIDE CV LINE RIGHT  02/13/2018  . IR REMOVAL TUN CV CATH W/O FL  02/24/2018  . IR US GUIDE VASC ACCESS RIGHT  02/13/2018  . TEE WITHOUT CARDIOVERSION N/A 01/21/2018   Procedure: TRANSESOPHAGEAL ECHOCARDIOGRAM (TEE);  Surgeon: Buford Dresser, MD;  Location: [facility] ENDOSCOPY;  Service: Cardiovascular;  Laterality: N/A;    Social History:  reports that he quit smoking about 17 years ago. His smoking use  included cigarettes. He has a 24.00 pack-year smoking history. He has never used smokeless tobacco. He reports that he drinks alcohol. He reports that he does not use drugs.  Allergies:  Allergies  Allergen Reactions  . Ace Inhibitors Cough  . Cefazolin     Possible cause of AKI, AIN    Medications Prior to Admission  Medication Sig Dispense Refill  . aspirin 325 MG tablet Take 325 mg by mouth daily.      . bisacodyl (DULCOLAX) 10 MG suppository Place 10 mg rectally once as needed (for constipation not relieved by Milk of Magnesia).     . Calcium Citrate-Vitamin D (CALCIUM CITRATE + D3) 250-200 MG-UNIT TABS Take 1 tablet by mouth 2 (two) times daily.    Marland Kitchen ceFAZolin (ANCEF) IVPB Inject 2 g into the vein every 8 (eight) hours. Indication:  MSSA bacteremia/endocarditis Last Day of Therapy:  03/08/18 Labs - Once weekly:  CBC/D and BMP, Labs - Every other week:  ESR and CRP 138 Units 0  . cetirizine (ZYRTEC) 10 MG tablet Take 10 mg by mouth daily.    . Dulaglutide (TRULICITY) 1.5 VQ/9.4HW SOPN Inject 1.5 mg into the skin every Monday.     Marland Kitchen glipiZIDE (GLUCOTROL XL) 10 MG 24 hr tablet Take 10 mg by mouth daily.   0  . insulin aspart (NOVOLOG) 100 UNIT/ML injection Before each meal 3 times a day, 140-199 - 2 units, 200-250 - 4 units, 251-299 - 6 units,  300-349 - 8 units,  350 or above 10 units. Insulin PEN if approved, provide syringes and needles if needed. (Patient taking differently: Inject 2-10 Units into the skin See admin instructions. Inject 2-10 units into the skin three times a day before meals, per sliding scale: BGL 140-199 = 2 units; 200-250 = 4 units; 251-299 = 6 units; 300-349 = 8 units; 350 or greater = 10 units) 10 mL 0  . ketoconazole (NIZORAL) 2 % cream Apply 1 application topically See admin instructions. Apply to both buttocks and "GL fold" rash two times a day    . latanoprost (XALATAN) 0.005 % ophthalmic solution Place 1 drop into both eyes at bedtime.  3  . losartan  (COZAAR) 50 MG tablet Take 50 mg by mouth daily.   98  . magnesium hydroxide (MILK OF MAGNESIA) 400 MG/5ML suspension Take 30 mLs by mouth once as needed for mild constipation.    . metoprolol (LOPRESSOR) 50 MG tablet TAKE 2 TABLETS BY MOUTH EVERY MORNING AND 1 EVERY EVENING (Patient taking differently: Take 50-100 mg by mouth See admin instructions. Take 100 mg by mouth in the morning and 50 mg in the evening) 90 tablet 5  . Multiple Vitamin (MULTIVITAMIN) tablet Take 1 tablet by  mouth daily.      . naproxen sodium (ANAPROX) 220 MG tablet Take 220 mg by mouth 2 (two) times daily as needed (pain).    . pantoprazole (PROTONIX) 40 MG tablet Take 40 mg by mouth daily.      . polyethylene glycol (MIRALAX / GLYCOLAX) packet Take 17 g by mouth daily as needed for mild constipation. 14 each 0  . simvastatin (ZOCOR) 20 MG tablet Take 20 mg by mouth daily at 6 PM.   98  . Sodium Phosphates (RA SALINE ENEMA) 19-7 GM/118ML ENEM Place 1 enema rectally once as needed (for constipation not relieved by Dulcolax suppository and notify MD if no relief from enema).    Nelva Nay SOLOSTAR 300 UNIT/ML SOPN Inject 30 Units into the skin See admin instructions. Use up to 100 units daily (Patient taking differently: Inject 30 Units into the skin daily. ) 1 pen 0  . [DISCONTINUED] oxyCODONE (OXY IR/ROXICODONE) 5 MG immediate release tablet Take 1 tablet (5 mg total) by mouth every 4 (four) hours as needed for breakthrough pain (pain score 4-6). (Patient taking differently: Take 5 mg by mouth every 4 (four) hours as needed for breakthrough pain (for pain). ) 5 tablet 0     Physical Exam: Blood pressure (!) 161/83, pulse 89, temperature 97.8 F (36.6 C), temperature source Oral, resp. rate 18, height '5\' 10"'  (1.778 m), weight 108 kg, SpO2 98 %. General: pleasant, chronically ill-appearing white male who is laying in bed in NAD HEENT: head is normocephalic, atraumatic.  Sclera are noninjected.  PERRL.  Ears and nose without any  masses or lesions.  Mouth is pink and moist, poor dentition. Heart: Irregularly irregular.  Normal s1,s2. +murmur. No obvious gallops, or rubs noted.  Palpable radial pulses bilaterally Lungs: CTAB, no wheezes, rhonchi, or rales noted.  Respiratory effort nonlabored Abd: soft, diffuse mild tenderness, states the greatest pain is in his right upper quadrant but is not much more than all other quadrants. ND, +BS, no masses, hernias, or organomegaly MS: Bilateral BKA's.  Upper extremities are symmetrical with normal range of motion. Skin: warm and dry with no masses, lesions, or rashes Psych: A&Ox3 with an appropriate affect.   Results for orders placed or performed during the hospital encounter of 01/27/18 (from the past 48 hour(s))  Glucose, capillary     Status: Abnormal   Collection Time: 02/23/18  4:30 PM  Result Value Ref Range   Glucose-Capillary 185 (H) 70 - 99 mg/dL  Glucose, capillary     Status: Abnormal   Collection Time: 02/23/18  9:14 PM  Result Value Ref Range   Glucose-Capillary 174 (H) 70 - 99 mg/dL  Protime-INR     Status: Abnormal   Collection Time: 02/24/18  3:36 AM  Result Value Ref Range   Prothrombin Time 31.3 (H) 11.4 - 15.2 seconds   INR 3.08     Comment: Performed at [facility] [address], [place], [place] [address]  Renal function panel     Status: Abnormal   Collection Time: 02/24/18  3:36 AM  Result Value Ref Range   Sodium 140 135 - 145 mmol/L   Potassium 3.3 (L) 3.5 - 5.1 mmol/L   Chloride 110 98 - 111 mmol/L   CO2 24 22 - 32 mmol/L   Glucose, Bld 68 (L) 70 - 99 mg/dL   BUN 70 (H) 8 - 23 mg/dL   Creatinine, Ser 2.25 (H) 0.61 - 1.24 mg/dL   Calcium 7.9 (L)  8.9 - 10.3 mg/dL   Phosphorus 4.1 2.5 - 4.6 mg/dL   Albumin 2.2 (L) 3.5 - 5.0 g/dL   GFR calc non Af Amer 30 (L) >60 mL/min   GFR calc Af Amer 34 (L) >60 mL/min    Comment: (NOTE) The eGFR has been calculated using the CKD EPI equation. This calculation has not been validated in all  clinical situations. eGFR's persistently <60 mL/min signify possible Chronic Kidney Disease.    Anion gap 6 5 - 15    Comment: Performed at [facility] [address] [place], [place] [address]  Glucose, capillary     Status: Abnormal   Collection Time: 02/24/18  7:56 AM  Result Value Ref Range   Glucose-Capillary 58 (L) 70 - 99 mg/dL  Glucose, capillary     Status: Abnormal   Collection Time: 02/24/18 11:27 AM  Result Value Ref Range   Glucose-Capillary 163 (H) 70 - 99 mg/dL  Glucose, capillary     Status: Abnormal   Collection Time: 02/24/18  4:50 PM  Result Value Ref Range   Glucose-Capillary 105 (H) 70 - 99 mg/dL  Glucose, capillary     Status: Abnormal   Collection Time: 02/24/18 10:22 PM  Result Value Ref Range   Glucose-Capillary 134 (H) 70 - 99 mg/dL  Protime-INR     Status: Abnormal   Collection Time: 02/25/18  3:35 AM  Result Value Ref Range   Prothrombin Time 32.7 (H) 11.4 - 15.2 seconds   INR 3.25     Comment: Performed at [facility], [place] [address], [place], [place] [address]  Renal function panel     Status: Abnormal   Collection Time: 02/25/18  3:35 AM  Result Value Ref Range   Sodium 136 135 - 145 mmol/L   Potassium 4.2 3.5 - 5.1 mmol/L    Comment: NO VISIBLE HEMOLYSIS   Chloride 105 98 - 111 mmol/L   CO2 21 (L) 22 - 32 mmol/L   Glucose, Bld 231 (H) 70 - 99 mg/dL   BUN 65 (H) 8 - 23 mg/dL   Creatinine, Ser 2.45 (H) 0.61 - 1.24 mg/dL   Calcium 8.1 (L) 8.9 - 10.3 mg/dL   Phosphorus 3.8 2.5 - 4.6 mg/dL   Albumin 2.4 (L) 3.5 - 5.0 g/dL   GFR calc non Af Amer 27 (L) >60 mL/min   GFR calc Af Amer 31 (L) >60 mL/min    Comment: (NOTE) The eGFR has been calculated using the CKD EPI equation. This calculation has not been validated in all clinical situations. eGFR's persistently <60 mL/min signify possible Chronic Kidney Disease.    Anion gap 10 5 - 15    Comment: Performed at [facility] [address] [place], [place] [address]    Glucose, capillary     Status: Abnormal   Collection Time: 02/25/18  8:07 AM  Result Value Ref Range   Glucose-Capillary 232 (H) 70 - 99 mg/dL  Glucose, capillary     Status: Abnormal   Collection Time: 02/25/18 11:24 AM  Result Value Ref Range   Glucose-Capillary 208 (H) 70 - 99 mg/dL   Ct Abdomen Pelvis Wo Contrast  Result Date: 02/25/2018 CLINICAL DATA:  Pt is septic with nausea, vomiting, abdomen pain. eval for gastroenteritis and colitis EXAM: CT ABDOMEN AND PELVIS WITHOUT CONTRAST TECHNIQUE: Multidetector CT imaging of the abdomen and pelvis was performed following the standard protocol without IV contrast. COMPARISON:  01/29/2018, 01/14/2018 FINDINGS: Lower chest: There  are increased bilateral pleural effusions, associated with bibasilar atelectasis. There is dense atherosclerotic calcification of coronary arteries. Hepatobiliary: Liver is diffusely low attenuation. No focal liver lesions are identified. There is periportal edema. Gallbladder is distended compare with prior studies. Layering, depending calcified gallstones are present. Pancreas: Unremarkable. No pancreatic ductal dilatation or surrounding inflammatory changes. Spleen: Low-attenuation lesion within the posterior aspect of the spleen has been better demonstrated on previous contrast-enhanced exams. The spleen is UPPER limits normal in size. Adrenals/Urinary Tract: The adrenal glands are normal in appearance. No intrarenal calculi and or ureteral obstruction. No renal mass. There is air within the urinary bladder raising the question of infectious process or recent catheterization. Stomach/Bowel: The stomach and small bowel loops are normal in appearance. The appendix is well seen and has a normal appearance. Loops of colon are normal in caliber and wall thickness. No acute diverticulitis or wall thickening. Vascular/Lymphatic: There is atherosclerotic calcification of the abdominal aorta and its branches. No aneurysm. No  retroperitoneal or mesenteric adenopathy. Reproductive: Prostate is unremarkable. Densely calcified vas deferens. Other: Small amount of ascites in the pelvis, perihepatic region, and paracolic gutters. There is diffuse body wall edema and mesenteric edema. Musculoskeletal: There is persistent sclerosis, irregularity, and destruction of the RIGHT aspect of T12 and L1, suspicious for osteomyelitis/discitis. There is further destruction since the study of 01/14/2018. Degenerative changes are noted at L4-5. IMPRESSION: 1. Increased bilateral pleural effusions, anasarca, and ascites. 2. Distended gallbladder and gallstones raising the question of acute cholecystitis. 3. Air within the urinary bladder suspicious for infection or recent catheterization. 4.  Aortic atherosclerosis.  (ICD10-I70.0) 5. Changes at T12-L1 suspicious for discitis/osteomyelitis, progressed since study performed September 10th. Consider further evaluation with MRI. 6. No evidence for colitis or gastroenteritis. No urinary tract obstruction. 7. Hepatic steatosis. 8. Stable splenic lesion, possibly representing infarct. Electronically Signed   By: Nolon Nations M.D.   On: 02/25/2018 14:54   Ir Removal Tun Cv Cath W/o Fl  Result Date: 02/24/2018 INDICATION: 62 year old with recovering acute kidney injury. Dialysis catheter is no longer needed. EXAM: REMOVAL TUNNELED CENTRAL VENOUS CATHETER MEDICATIONS: None ANESTHESIA/SEDATION: None FLUOROSCOPY TIME:  None COMPLICATIONS: None immediate. PROCEDURE: Informed written consent was obtained from the patient after a thorough discussion of the procedural risks, benefits and alternatives. All questions were addressed. Heparin was removed from both lumens. The retention suture was removed. Catheter was easily removed with manual traction. Small amount of bleeding at the catheter exit site. Bandage placed at the catheter exit site. IMPRESSION: Successful catheter removal as described above.  Electronically Signed   By: Markus Daft M.D.   On: 02/24/2018 12:47      Assessment/Plan Abdominal pain, cholelithiasis The patient was found to have a distended gallbladder with gallstones on his CT scan today.  The patient was eating well yesterday.  After supper he developed pain with dry heaving.  He states his pain radiated across his entire abdomen and was not focally tender in one spot necessarily.  His pain today seems to be diffuse as well.  He does seem slightly more tender in his right upper quadrant.  It is unclear if the patient truly has cholecystitis.  I will order a CMET for tomorrow morning.  We will also obtain a HIDA scan to definitively evaluate the gallbladder for acute cholecystitis.  Further discussion will be needed if this is positive pending his medical clearance for surgical intervention given his multitude of current medical problems.  MSSA bacteremia Osteomyelitis of RLE, s/p  BKA Endocarditis ARF, just off HD A fib on coumadin DM PVD H/o MI  FEN - NPO/IVFs VTE - coumadin  ID - daptomycin  Henreitta Cea, [facility] Surgery 02/25/2018, 4:24 PM Pager: [phone]

## 2018-02-25 NOTE — Clinical Social Work Note (Signed)
CSW advised by admissions director,Shantelle Little at [facility] that authorization received for ST rehab. MD advised and CSW informed that patient needs CT of abdomen. This was done today and in a subsequent conversation with MD, patient will not discharge today. Call made to admissions director and message left regarding no d/c today.  [name], MSW, LCSW Licensed Clinical Social Worker [place] [phone]

## 2018-02-25 NOTE — Progress Notes (Addendum)
PROGRESS NOTE  [name]  HWE:[id number] DOB: Jun 11, 1955 DOA: 01/27/2018 PCP: [name], MD   Brief Narrative: [name] is a 62 y.o. male with a history of sepsis due to right foot osteomyelitis s/p transtibial amputation and found to have MSSA bacteremia with splenic infarct and tricuspid valve endocarditis. He was discharged on ancef to [facility] but was quickly brought back in due to oliguria and found to have acute renal failure. He developed acute respiratory failure and transferred to ICU on CCM service. Antibiotics changed to daptomycin per ID and nephrology oversaw hemodialysis through right IJ temporary catheter on 10/12 and 10/14. With improvement in respiratory status he was transferred to the medical floor. Nephrology following closely for signs of renal recovery. Creatinine trending downward as of 10/17 with increasing urine output. Temporary HD catheter removed 10/21. On 10/22 the patient developed acute abdominal pain worse on the right with nausea, vomiting, and inability to take po. CT abd/pelvis demonstrated distended gallbladder with gallstones concerning for cholecystitis. Also showed anasarca, ascites, and pleural effusions, progressive T12-L1 changes noticed on previous study, and stable splenic lesions.   Assessment & Plan: Principal Problem:   Acute renal failure (ARF) (HCC) Active Problems:   Insulin-requiring or dependent type II diabetes mellitus ([place])   Essential hypertension   CAD (coronary artery disease)   Leukocytosis   Thrombocytopenia (HCC)   S/P BKA (below knee amputation) unilateral, right (HCC)   Endocarditis of tricuspid valve   Hyponatremia   PAF (paroxysmal atrial fibrillation) (HCC)   Aortic atherosclerosis (HCC)   Acute hypoxemic respiratory failure (HCC)  Oliguric acute renal failure: Due to ATN vs. ancef-induced AIN. Continued slow improvement until 10/22. CrCl 72ml/min and UOP satisfactory. Pulled temporary catheter  10/21.  - Continue to monitor UOP, BMP.  Acute abdominal pain, nausea, vomiting: CT abd/pelvis with cholelithiasis and consistent changes with cholecystitis, though with diffuse edema/anasarca, could be false positive.  - Given degree of suspicion, will proceed with HIDA scan - Surgery consulted.  - NPO. IV analgesic AFTER HIDA scan. - Check LFTs.  Paroxysmal atrial fibrillation: New diagnosis during admission. Maintaining sinus rhythm. Cardiology consulted, signed off 10/3. - Continue coumadin dosed per pharmacy. - Continue metoprolol - Follow up with Dr. Percival Spanish in 3-4 weeks post discharge.  MSSA bacteremia and tricuspid valve endocarditis and splenic infarcts: Stopped ancef.  - Continue daptomycin to complete 6 weeks Tx (through 11/2). - CK transiently mildly elevated but since resolved. Recheck CK weekly. - Due to continued T12-L1 lesion, seen on CT 10/22 progressing since previous exam, ID recommends starting doxycycline 100mg  po BID on a full stomach following completion of daptomycin (initial C&S reviewed, was pansensitive). ID will arrange outpatient follow up to coordinate repeat imaging.   Diarrhea: No indication for CDiff testing. Possibly due to malabsorption/cholecystitis, though this symptom preceded abd pain/N/V.  - Continue probiotics 2 weeks (final dose 11/16) following conclusion of antibiotics.  PVD s/p bilateral transtibial amputations: Reevaluated by Dr. Sharol Given during this hospitalization, staples removed from recent right TTA on 10/15.  - Follow up with Dr. Sharol Given and get prosthesis fitting after discharge.   Anemia of acute and chronic disease: Stable hgb.  - Got aranesp [place] 10/18. - Monitor daily.  T12-L1 Discitis/osteomyelitis: Loss of cortex along anterior inferior aspect of T12 vertebral body, lucency in posterior aspect of L1 vertebral body. Seems to have progressed since previous exam.  - D/w ID, will prolong antibiotics course with doxycycline as above.  -  Will need MRI w/  and w/o contrast for further evaluation closer to conclusion of antibiotics. Not complaining of significant back pain.   Acute hypoxic respiratory failure: Due to pulmonary edema. Resolved.   Iron deficiency anemia and anemia of acute illness:  - Has been stable in 8's. - Iron supplement  T2DM: A1c 8.5%.  - At inpatient goal on SSI  Thrombocytopenia: Resolved.  Oral candidiasis: Treated with fluconazole  Obesity: BMI 34.  - Dietitian consulted  DVT prophylaxis: Coumadin  Code Status: Full Family Communication: None at bedside Disposition Plan: Plan was discharge to SNF 10/21, though patient developed severe symptoms, possible cholecystitis on 10/22. Will remain inpatient pending completion of work up.  Consultants:   ID  IR  Cardiology  PCCM  Nephrology  General surgery  Procedures:   Temporary HD catheter  HD  Antimicrobials:  Daptomycin    Subjective: Had imodium for loose stool x1 yesterday, felt abdominal pain come on relatively abruptly, no constant and severe yesterday evening not improved with enema, slightly improved with tylenol, associated with nausea, vomiting and ongoing abdominal pain worst on right side radiating across abdomen. No back pain.  Objective: Vitals:   02/24/18 1650 02/24/18 2223 02/25/18 0521 02/25/18 0808  BP: (!) 141/73 (!) 149/73 (!) 170/79 (!) 161/83  Pulse: 84 91 94 89  Resp: 18 18 18 18   Temp: 98.4 F (36.9 C) 98.1 F (36.7 C) 98 F (36.7 C) 97.8 F (36.6 C)  TempSrc: Oral Oral Oral Oral  SpO2: 99% 95% 99% 98%  Weight:  108 kg    Height:        Intake/Output Summary (Last 24 hours) at 02/25/2018 1506 Last data filed at 02/25/2018 1300 Gross per 24 hour  Intake 230 ml  Output 900 ml  Net -670 ml   Filed Weights   02/22/18 2050 02/23/18 0500 02/24/18 2223  Weight: 108 kg 108 kg 108 kg   Gen: 62 y.o. male in no distress Pulm: Nonlabored breathing room air. Clear. CV: Regular rate and rhythm.  II/VI systolic murmur, rub, or gallop. No JVD, no significant dependent edema. GI: Abdomen is soft, tender to palpation worst in RUQ, least in LLQ, +guarding, no rebound or rigidity. Obese with no definite fluid wave. +BS.  Ext: Transitibal amputation site on right c/d/i, normal, warm left TT site. Skin: No new rashes, lesions or ulcers on visualized skin. Not jaundiced.  Neuro: Alert and oriented. No focal neurological deficits. Psych: Judgement and insight appear fair. Mood euthymic & affect congruent. Behavior is appropriate.    Data Reviewed: I have personally reviewed following labs and imaging studies  CBC: Recent Labs  Lab 02/21/18 0803 02/23/18 0338  WBC 9.8 9.5  HGB 8.6* 8.3*  HCT 28.2* 27.5*  MCV 91.0 90.8  PLT 229 295   Basic Metabolic Panel: Recent Labs  Lab 02/21/18 0803 02/22/18 0330 02/23/18 0338 02/24/18 0336 02/25/18 0335  NA 136 136 139 140 136  K 3.9 3.6 3.5 3.3* 4.2  CL 104 103 106 110 105  CO2 23 23 24 24  21*  GLUCOSE 179* 130* 139* 68* 231*  BUN 74* 77* 77* 70* 65*  CREATININE 3.45* 3.18* 2.56* 2.25* 2.45*  CALCIUM 7.8* 7.6* 7.8* 7.9* 8.1*  PHOS 5.4* 5.2* 4.7* 4.1 3.8   GFR: Estimated Creatinine Clearance: 39 mL/min (A) (by C-G formula based on SCr of 2.45 mg/dL (H)). Liver Function Tests: Recent Labs  Lab 02/21/18 0803 02/22/18 0330 02/23/18 0338 02/24/18 0336 02/25/18 0335  ALBUMIN 2.2* 2.1* 2.2* 2.2* 2.4*  No results for input(s): LIPASE, AMYLASE in the last 168 hours. No results for input(s): AMMONIA in the last 168 hours. Coagulation Profile: Recent Labs  Lab 02/21/18 0803 02/22/18 0330 02/23/18 0338 02/24/18 0336 02/25/18 0335  INR 2.88 3.36 2.99 3.08 3.25   Cardiac Enzymes: Recent Labs  Lab 02/20/18 0332 02/23/18 0338  CKTOTAL 429* 187   BNP (last 3 results) No results for input(s): PROBNP in the last 8760 hours. HbA1C: No results for input(s): HGBA1C in the last 72 hours. CBG: Recent Labs  Lab 02/24/18 1127  02/24/18 1650 02/24/18 2222 02/25/18 0807 02/25/18 1124  GLUCAP 163* 105* 134* 232* 208*   Lipid Profile: No results for input(s): CHOL, HDL, LDLCALC, TRIG, CHOLHDL, LDLDIRECT in the last 72 hours. Thyroid Function Tests: No results for input(s): TSH, T4TOTAL, FREET4, T3FREE, THYROIDAB in the last 72 hours. Anemia Panel: No results for input(s): VITAMINB12, FOLATE, FERRITIN, TIBC, IRON, RETICCTPCT in the last 72 hours. Urine analysis:    Component Value Date/Time   COLORURINE YELLOW 02/19/2018 1441   APPEARANCEUR CLOUDY (A) 02/19/2018 1441   LABSPEC 1.013 02/19/2018 1441   PHURINE 5.0 02/19/2018 1441   GLUCOSEU NEGATIVE 02/19/2018 1441   HGBUR LARGE (A) 02/19/2018 1441   BILIRUBINUR NEGATIVE 02/19/2018 1441   KETONESUR NEGATIVE 02/19/2018 1441   PROTEINUR 100 (A) 02/19/2018 1441   UROBILINOGEN 0.2 07/28/2014 0433   NITRITE NEGATIVE 02/19/2018 1441   LEUKOCYTESUR LARGE (A) 02/19/2018 1441   Recent Results (from the past 240 hour(s))  Gastrointestinal Panel by PCR , Stool     Status: None   Collection Time: 02/18/18 12:18 PM  Result Value Ref Range Status   Campylobacter species NOT DETECTED NOT DETECTED Final   Plesimonas shigelloides NOT DETECTED NOT DETECTED Final   Salmonella species NOT DETECTED NOT DETECTED Final   Yersinia enterocolitica NOT DETECTED NOT DETECTED Final   Vibrio species NOT DETECTED NOT DETECTED Final   Vibrio cholerae NOT DETECTED NOT DETECTED Final   Enteroaggregative E coli (EAEC) NOT DETECTED NOT DETECTED Final   Enteropathogenic E coli (EPEC) NOT DETECTED NOT DETECTED Final   Enterotoxigenic E coli (ETEC) NOT DETECTED NOT DETECTED Final   Shiga like toxin producing E coli (STEC) NOT DETECTED NOT DETECTED Final   Shigella/Enteroinvasive E coli (EIEC) NOT DETECTED NOT DETECTED Final   Cryptosporidium NOT DETECTED NOT DETECTED Final   Cyclospora cayetanensis NOT DETECTED NOT DETECTED Final   Entamoeba histolytica NOT DETECTED NOT DETECTED Final    Giardia lamblia NOT DETECTED NOT DETECTED Final   Adenovirus F40/41 NOT DETECTED NOT DETECTED Final   Astrovirus NOT DETECTED NOT DETECTED Final   Norovirus GI/GII NOT DETECTED NOT DETECTED Final   Rotavirus A NOT DETECTED NOT DETECTED Final   Sapovirus (I, II, IV, and V) NOT DETECTED NOT DETECTED Final    Comment: Performed at [facility], [address], [place], [place] [address]      Radiology Studies: Ct Abdomen Pelvis Wo Contrast  Result Date: 02/25/2018 CLINICAL DATA:  Pt is septic with nausea, vomiting, abdomen pain. eval for gastroenteritis and colitis EXAM: CT ABDOMEN AND PELVIS WITHOUT CONTRAST TECHNIQUE: Multidetector CT imaging of the abdomen and pelvis was performed following the standard protocol without IV contrast. COMPARISON:  01/29/2018, 01/14/2018 FINDINGS: Lower chest: There are increased bilateral pleural effusions, associated with bibasilar atelectasis. There is dense atherosclerotic calcification of coronary arteries. Hepatobiliary: Liver is diffusely low attenuation. No focal liver lesions are identified. There is periportal edema. Gallbladder is distended compare with prior studies. Layering,  depending calcified gallstones are present. Pancreas: Unremarkable. No pancreatic ductal dilatation or surrounding inflammatory changes. Spleen: Low-attenuation lesion within the posterior aspect of the spleen has been better demonstrated on previous contrast-enhanced exams. The spleen is UPPER limits normal in size. Adrenals/Urinary Tract: The adrenal glands are normal in appearance. No intrarenal calculi and or ureteral obstruction. No renal mass. There is air within the urinary bladder raising the question of infectious process or recent catheterization. Stomach/Bowel: The stomach and small bowel loops are normal in appearance. The appendix is well seen and has a normal appearance. Loops of colon are normal in caliber and wall thickness. No acute diverticulitis or wall  thickening. Vascular/Lymphatic: There is atherosclerotic calcification of the abdominal aorta and its branches. No aneurysm. No retroperitoneal or mesenteric adenopathy. Reproductive: Prostate is unremarkable. Densely calcified vas deferens. Other: Small amount of ascites in the pelvis, perihepatic region, and paracolic gutters. There is diffuse body wall edema and mesenteric edema. Musculoskeletal: There is persistent sclerosis, irregularity, and destruction of the RIGHT aspect of T12 and L1, suspicious for osteomyelitis/discitis. There is further destruction since the study of 01/14/2018. Degenerative changes are noted at L4-5. IMPRESSION: 1. Increased bilateral pleural effusions, anasarca, and ascites. 2. Distended gallbladder and gallstones raising the question of acute cholecystitis. 3. Air within the urinary bladder suspicious for infection or recent catheterization. 4.  Aortic atherosclerosis.  (ICD10-I70.0) 5. Changes at T12-L1 suspicious for discitis/osteomyelitis, progressed since study performed September 10th. Consider further evaluation with MRI. 6. No evidence for colitis or gastroenteritis. No urinary tract obstruction. 7. Hepatic steatosis. 8. Stable splenic lesion, possibly representing infarct. Electronically Signed   By: Nolon Nations M.D.   On: 02/25/2018 14:54   Ir Removal Tun Cv Cath W/o Fl  Result Date: 02/24/2018 INDICATION: 62 year old with recovering acute kidney injury. Dialysis catheter is no longer needed. EXAM: REMOVAL TUNNELED CENTRAL VENOUS CATHETER MEDICATIONS: None ANESTHESIA/SEDATION: None FLUOROSCOPY TIME:  None COMPLICATIONS: None immediate. PROCEDURE: Informed written consent was obtained from the patient after a thorough discussion of the procedural risks, benefits and alternatives. All questions were addressed. Heparin was removed from both lumens. The retention suture was removed. Catheter was easily removed with manual traction. Small amount of bleeding at the  catheter exit site. Bandage placed at the catheter exit site. IMPRESSION: Successful catheter removal as described above. Electronically Signed   By: Markus Daft M.D.   On: 02/24/2018 12:47    Scheduled Meds: . Chlorhexidine Gluconate Cloth  6 each Topical Q0600  . feeding supplement (PRO-STAT SUGAR FREE 64)  30 mL Oral BID BM  . insulin aspart  0-5 Units Subcutaneous QHS  . insulin aspart  0-9 Units Subcutaneous TID WC  . insulin aspart  4 Units Subcutaneous TID WC  . insulin glargine  22 Units Subcutaneous QHS  . latanoprost  1 drop Both Eyes QHS  . mouth rinse  15 mL Mouth Rinse BID  . metoprolol tartrate  50 mg Oral BID  . multivitamin  1 tablet Oral QHS  . nystatin   Topical BID  . pantoprazole  40 mg Oral Q1200  . saccharomyces boulardii  250 mg Oral BID  . traZODone  50 mg Oral QHS  . Warfarin - Pharmacist Dosing Inpatient   Does not apply q1800   Continuous Infusions: . sodium chloride Stopped (02/20/18 0530)  . DAPTOmycin (CUBICIN)  IV 700 mg (02/24/18 2100)     LOS: 29 days   Time spent: 35 minutes.  Patrecia Pour, MD [facility] [email or web address] Password  [email or web address] 02/25/2018, 3:06 PM

## 2018-02-26 ENCOUNTER — Inpatient Hospital Stay (HOSPITAL_COMMUNITY): Payer: Medicare Other

## 2018-02-26 DIAGNOSIS — R1084 Generalized abdominal pain: Secondary | ICD-10-CM

## 2018-02-26 LAB — COMPREHENSIVE METABOLIC PANEL
ALK PHOS: 94 U/L (ref 38–126)
ALT: 25 U/L (ref 0–44)
AST: 49 U/L — AB (ref 15–41)
Albumin: 2.2 g/dL — ABNORMAL LOW (ref 3.5–5.0)
Anion gap: 10 (ref 5–15)
BUN: 72 mg/dL — ABNORMAL HIGH (ref 8–23)
CHLORIDE: 104 mmol/L (ref 98–111)
CO2: 21 mmol/L — ABNORMAL LOW (ref 22–32)
Calcium: 8 mg/dL — ABNORMAL LOW (ref 8.9–10.3)
Creatinine, Ser: 2.66 mg/dL — ABNORMAL HIGH (ref 0.61–1.24)
GFR calc non Af Amer: 24 mL/min — ABNORMAL LOW (ref >60)
GFR, EST AFRICAN AMERICAN: 28 mL/min — AB (ref >60)
Glucose, Bld: 191 mg/dL — ABNORMAL HIGH (ref 70–99)
Potassium: 3.6 mmol/L (ref 3.5–5.1)
Sodium: 135 mmol/L (ref 135–145)
Total Bilirubin: 1.2 mg/dL (ref 0.3–1.2)
Total Protein: 6.2 g/dL — ABNORMAL LOW (ref 6.5–8.1)

## 2018-02-26 LAB — CBC
HCT: 29.5 % — ABNORMAL LOW (ref 39.0–52.0)
HEMATOCRIT: 29.5 % — AB (ref 39.0–52.0)
HEMOGLOBIN: 9.1 g/dL — AB (ref 13.0–17.0)
HEMOGLOBIN: 8.8 g/dL — AB (ref 13.0–17.0)
MCH: 27.7 pg (ref 26.0–34.0)
MCH: 27 pg (ref 26.0–34.0)
MCHC: 30.8 g/dL (ref 30.0–36.0)
MCHC: 29.8 g/dL — ABNORMAL LOW (ref 30.0–36.0)
MCV: 89.7 fL (ref 80.0–100.0)
MCV: 90.5 fL (ref 80.0–100.0)
NRBC: 0 % (ref 0.0–0.2)
Platelets: 236 10*3/uL (ref 150–400)
Platelets: 237 10*3/uL (ref 150–400)
RBC: 3.29 MIL/uL — AB (ref 4.22–5.81)
RBC: 3.26 MIL/uL — AB (ref 4.22–5.81)
RDW: 14.2 % (ref 11.5–15.5)
RDW: 14.3 % (ref 11.5–15.5)
WBC: 30.3 10*3/uL — ABNORMAL HIGH (ref 4.0–10.5)
WBC: 29.2 10*3/uL — AB (ref 4.0–10.5)
nRBC: 0 % (ref 0.0–0.2)

## 2018-02-26 LAB — GLUCOSE, CAPILLARY
GLUCOSE-CAPILLARY: 176 mg/dL — AB (ref 70–99)
GLUCOSE-CAPILLARY: 165 mg/dL — AB (ref 70–99)
GLUCOSE-CAPILLARY: 191 mg/dL — AB (ref 70–99)
Glucose-Capillary: 173 mg/dL — ABNORMAL HIGH (ref 70–99)
Glucose-Capillary: 168 mg/dL — ABNORMAL HIGH (ref 70–99)

## 2018-02-26 LAB — PROTIME-INR
INR: 3.27
Prothrombin Time: 32.8 seconds — ABNORMAL HIGH (ref 11.4–15.2)

## 2018-02-26 MED ORDER — MORPHINE SULFATE (PF) 4 MG/ML IV SOLN
3.0000 mg | Freq: Once | INTRAVENOUS | Status: AC
  Administered 2018-02-26: 3 mg via INTRAVENOUS

## 2018-02-26 MED ORDER — WARFARIN 0.5 MG HALF TABLET
0.5000 mg | ORAL_TABLET | Freq: Once | ORAL | Status: DC
  Filled 2018-02-26: qty 1

## 2018-02-26 MED ORDER — SODIUM CHLORIDE 0.9 % IV SOLN
2.0000 g | INTRAVENOUS | Status: DC
  Administered 2018-02-26 – 2018-02-27 (×2): 2 g via INTRAVENOUS
  Filled 2018-02-26 (×3): qty 20

## 2018-02-26 MED ORDER — BOOST / RESOURCE BREEZE PO LIQD CUSTOM
1.0000 | Freq: Three times a day (TID) | ORAL | Status: DC
  Administered 2018-02-26 – 2018-03-02 (×5): 1 via ORAL
  Filled 2018-02-26 (×19): qty 1

## 2018-02-26 MED ORDER — METRONIDAZOLE IN NACL 5-0.79 MG/ML-% IV SOLN
500.0000 mg | Freq: Three times a day (TID) | INTRAVENOUS | Status: DC
  Administered 2018-02-26 – 2018-02-28 (×6): 500 mg via INTRAVENOUS
  Filled 2018-02-26 (×6): qty 100

## 2018-02-26 MED ORDER — MORPHINE SULFATE (PF) 4 MG/ML IV SOLN
INTRAVENOUS | Status: AC
  Administered 2018-02-26: 3 mg via INTRAVENOUS
  Filled 2018-02-26: qty 1

## 2018-02-26 MED ORDER — PHYTONADIONE 5 MG PO TABS
2.5000 mg | ORAL_TABLET | Freq: Once | ORAL | Status: AC
  Administered 2018-02-26: 2.5 mg via ORAL
  Filled 2018-02-26: qty 1

## 2018-02-26 NOTE — Progress Notes (Addendum)
Nutrition Follow-up  DOCUMENTATION CODES:   Obesity unspecified  INTERVENTION:  D/c Pro-stat  -Boost Breeze po TID, each supplement provides 250 kcal and 9 grams of protein  Once diet advanced:   -snacks between meals   NUTRITION DIAGNOSIS:   Increased nutrient needs related to wound healing, acute illness(AKI on HD) as evidenced by estimated needs. Ongoing  GOAL:   Patient will meet greater than or equal to 90% of their needs Not met  MONITOR:   PO intake, Diet advancement, Skin, Labs, I & O's  REASON FOR ASSESSMENT:   LOS    ASSESSMENT:   [name] is a 62 yo male with PMH of insulin-dependent type 2 diabetes, HTN, CAD, PVD, right BKA 12/2014, left BKA 01/17/2018, endocarditis who is admitted for acute renal failure. Temporary HD catheter removed 10/21. On 10/22 the patient developed acute abdominal pain worse on the right with nausea, vomiting, and inability to take po. CT abd/pelvis demonstrated distended gallbladder with gallstones concerning for cholecystitis. Also showed anasarca, ascites, and pleural effusions, progressive T12-L1 changes noticed on previous study, and stable splenic lesions.   10/23 HIDA scan found acute cystitis.  Pt currently clear liquid diet, NPO tomorrow for cholecystectomy  Pt with poor po intake for the last 2 days. Pt reports that he started having severe pain after eating supper night before last. (cholecystitis)   Pt refusing many of the Pro-Stat supplements. Spoke with pt who reported that they have a cough syrup taste. Pt reports Nepro and Ensure make his stomach hurt. At home pt says he drinks lactose free milk.   Pt reports not liking some of the food saying it is too greasy. Pt amenable to simple foods like sandwiches. Once diet advanced will order snacks between meals to increase po intake.   Pt expected to be NPO/clear liquid through Friday. Will re-evaluate when diet advanced after procedure.  Medications reviewed and  include: ss novolog, novolog w/ meals, lantus, renal MVI Labs reviewed: CBG 147-208, GFR 24   Diet Order:   Diet Order            Diet NPO time specified  Diet effective now        Diet Carb Modified              EDUCATION NEEDS:   Education needs have been addressed  Skin:  Skin Assessment: Reviewed RN Assessment(moisture associated skin damage buttocks groin; right leg incision (amputation 9/13))  Last BM:  10/23 type 7  Height:   Ht Readings from Last 1 Encounters:  01/27/18 '5\' 10"'  (1.778 m)    Weight:   Wt Readings from Last 1 Encounters:  02/26/18 106 kg    Ideal Body Weight:  66.5 kg  BMI:  Body mass index is 33.53 kg/m.  Estimated Nutritional Needs:   Kcal:  1800-2000 kcals   Protein:  90-100  Fluid:  UOP + 1000 mL   [name], Dietetic Intern [phone]

## 2018-02-26 NOTE — Progress Notes (Signed)
[facility] Progress Note     Subjective: CC-  States that he is still having RUQ pain, but feeling a little better than yesterday. No n/v today. 2 loose BMs today. HIDA positive for acute cholecystitis. WBC up to 29.2, afebrile.  Objective: Vital signs in last 24 hours: Temp:  [98.1 F (36.7 C)-98.5 F (36.9 C)] 98.2 F (36.8 C) (10/23 1159) Pulse Rate:  [89-92] 92 (10/23 1159) Resp:  [18-20] 18 (10/23 1159) BP: (116-138)/(52-66) 125/55 (10/23 1159) SpO2:  [96 %-100 %] 96 % (10/23 1159) Weight:  [017 kg] 106 kg (10/23 0000) Last BM Date: 02/25/18  Intake/Output from previous day: 10/22 0701 - 10/23 0700 In: 350 [P.O.:340; I.V.:10] Out: 200 [Urine:200] Intake/Output this shift: Total I/O In: 225 [P.O.:225] Out: -   PE: Gen:  Alert, NAD, pleasant HEENT: EOM's intact, pupils equal and round, poor dentition Card:  Irregularly irregular Pulm:  CTAB, no W/R/R, effort normal Abd: Soft, ND, +BS, no HSM, TTP RUQ without rebound or guarding Ext:  S/p B BKA Psych: A&Ox3  Skin: no rashes noted, warm and dry  Lab Results:  Recent Labs    02/26/18 0350 02/26/18 1208  WBC 30.3* 29.2*  HGB 9.1* 8.8*  HCT 29.5* 29.5*  PLT 236 237   BMET Recent Labs    02/25/18 0335 02/26/18 0350  NA 136 135  K 4.2 3.6  CL 105 104  CO2 21* 21*  GLUCOSE 231* 191*  BUN 65* 72*  CREATININE 2.45* 2.66*  CALCIUM 8.1* 8.0*   PT/INR Recent Labs    02/25/18 0335 02/26/18 0350  LABPROT 32.7* 32.8*  INR 3.25 3.27   CMP     Component Value Date/Time   NA 135 02/26/2018 0350   K 3.6 02/26/2018 0350   CL 104 02/26/2018 0350   CO2 21 (L) 02/26/2018 0350   GLUCOSE 191 (H) 02/26/2018 0350   BUN 72 (H) 02/26/2018 0350   CREATININE 2.66 (H) 02/26/2018 0350   CALCIUM 8.0 (L) 02/26/2018 0350   PROT 6.2 (L) 02/26/2018 0350   ALBUMIN 2.2 (L) 02/26/2018 0350   AST 49 (H) 02/26/2018 0350   ALT 25 02/26/2018 0350   ALKPHOS 94 02/26/2018 0350   BILITOT 1.2 02/26/2018 0350    GFRNONAA 24 (L) 02/26/2018 0350   GFRAA 28 (L) 02/26/2018 0350   Lipase     Component Value Date/Time   LIPASE 23 01/14/2018 1926       Studies/Results: Ct Abdomen Pelvis Wo Contrast  Result Date: 02/25/2018 CLINICAL DATA:  Pt is septic with nausea, vomiting, abdomen pain. eval for gastroenteritis and colitis EXAM: CT ABDOMEN AND PELVIS WITHOUT CONTRAST TECHNIQUE: Multidetector CT imaging of the abdomen and pelvis was performed following the standard protocol without IV contrast. COMPARISON:  01/29/2018, 01/14/2018 FINDINGS: Lower chest: There are increased bilateral pleural effusions, associated with bibasilar atelectasis. There is dense atherosclerotic calcification of coronary arteries. Hepatobiliary: Liver is diffusely low attenuation. No focal liver lesions are identified. There is periportal edema. Gallbladder is distended compare with prior studies. Layering, depending calcified gallstones are present. Pancreas: Unremarkable. No pancreatic ductal dilatation or surrounding inflammatory changes. Spleen: Low-attenuation lesion within the posterior aspect of the spleen has been better demonstrated on previous contrast-enhanced exams. The spleen is UPPER limits normal in size. Adrenals/Urinary Tract: The adrenal glands are normal in appearance. No intrarenal calculi and or ureteral obstruction. No renal mass. There is air within the urinary bladder raising the question of infectious process or recent catheterization. Stomach/Bowel: The stomach and  small bowel loops are normal in appearance. The appendix is well seen and has a normal appearance. Loops of colon are normal in caliber and wall thickness. No acute diverticulitis or wall thickening. Vascular/Lymphatic: There is atherosclerotic calcification of the abdominal aorta and its branches. No aneurysm. No retroperitoneal or mesenteric adenopathy. Reproductive: Prostate is unremarkable. Densely calcified vas deferens. Other: Small amount of  ascites in the pelvis, perihepatic region, and paracolic gutters. There is diffuse body wall edema and mesenteric edema. Musculoskeletal: There is persistent sclerosis, irregularity, and destruction of the RIGHT aspect of T12 and L1, suspicious for osteomyelitis/discitis. There is further destruction since the study of 01/14/2018. Degenerative changes are noted at L4-5. IMPRESSION: 1. Increased bilateral pleural effusions, anasarca, and ascites. 2. Distended gallbladder and gallstones raising the question of acute cholecystitis. 3. Air within the urinary bladder suspicious for infection or recent catheterization. 4.  Aortic atherosclerosis.  (ICD10-I70.0) 5. Changes at T12-L1 suspicious for discitis/osteomyelitis, progressed since study performed September 10th. Consider further evaluation with MRI. 6. No evidence for colitis or gastroenteritis. No urinary tract obstruction. 7. Hepatic steatosis. 8. Stable splenic lesion, possibly representing infarct. Electronically Signed   By: Nolon Nations M.D.   On: 02/25/2018 14:54   Nm Hepatobiliary Liver Func  Result Date: 02/26/2018 CLINICAL DATA:  Abdominal pain. Abnormal gallbladder on a recent CT. EXAM: NUCLEAR MEDICINE HEPATOBILIARY IMAGING TECHNIQUE: Sequential images of the abdomen were obtained out to 60 minutes following intravenous administration of radiopharmaceutical. RADIOPHARMACEUTICALS:  5.15 mCi Tc-66m Choletec IV COMPARISON:  CT, 02/25/2018 FINDINGS: There is prompt radiotracer accumulation by the liver. Prompt excretion is seen into the intra and extrahepatic biliary tree with small bowel activity seen early during the exam. No gallbladder activity was seen after 1 hour of imaging following radiotracer injection. The patient was then injected with 3 mg of morphine to induce sphincter of Odi spasm followed by additional imaging. No gallbladder activity was documented on the follow-up imaging. IMPRESSION: 1. Nonvisualization of the gallbladder  consistent with an obstructed cystic duct and acute cholecystitis. Electronically Signed   By: DLajean ManesM.D.   On: 02/26/2018 13:19    Anti-infectives: Anti-infectives (From admission, onward)   Start     Dose/Rate Route Frequency Ordered Stop   02/24/18 0000  daptomycin (CUBICIN) IVPB     700 mg Intravenous Every 24 hours 02/24/18 1141 03/09/18 2359   02/23/18 2000  DAPTOmycin (CUBICIN) 700 mg in sodium chloride 0.9 % IVPB     700 mg 228 mL/hr over 30 Minutes Intravenous Every 24 hours 02/23/18 1233 03/08/18 2359   02/13/18 1415  ceFAZolin (ANCEF) powder 2 g     2 g Other To Surgery 02/13/18 1410 02/13/18 2142   02/13/18 1347  ceFAZolin (ANCEF) 2-4 GM/100ML-% IVPB    Note to Pharmacy:  JArlean Hopping  : cabinet override      02/13/18 1347 02/13/18 1410   01/30/18 2000  DAPTOmycin (CUBICIN) 700 mg in sodium chloride 0.9 % IVPB  Status:  Discontinued     700 mg 228 mL/hr over 30 Minutes Intravenous Every 48 hours 01/30/18 1306 02/23/18 1233   01/30/18 1000  fluconazole (DIFLUCAN) tablet 50 mg     50 mg Oral Daily 01/29/18 0930 02/04/18 0914   01/29/18 2000  DAPTOmycin (CUBICIN) 700 mg in sodium chloride 0.9 % IVPB  Status:  Discontinued     700 mg 228 mL/hr over 30 Minutes Intravenous Every 48 hours 01/29/18 1013 01/30/18 1306   01/29/18 1030  fluconazole (  DIFLUCAN) tablet 100 mg     100 mg Oral  Once 01/29/18 0930 01/29/18 1121   01/28/18 2200  ceFAZolin (ANCEF) IVPB 1 g/50 mL premix  Status:  Discontinued     1 g 100 mL/hr over 30 Minutes Intravenous Every 24 hours 01/28/18 1007 01/28/18 1231   01/27/18 2245  ceFAZolin (ANCEF) IVPB 2g/100 mL premix  Status:  Discontinued     2 g 200 mL/hr over 30 Minutes Intravenous Every 12 hours 01/27/18 2243 01/28/18 1007   01/27/18 2230  ceFAZolin (ANCEF) IVPB  Status:  Discontinued    Note to Pharmacy:  Indication:  MSSA bacteremia/endocarditis Last Day of Therapy:  03/08/18 Labs - Once weekly:  CBC/D and BMP, Labs - Every other week:   ESR and CRP     2 g Intravenous Every 8 hours 01/27/18 2220 01/27/18 2227   01/27/18 2230  ceFAZolin (ANCEF) IVPB 2g/100 mL premix  Status:  Discontinued     2 g 200 mL/hr over 30 Minutes Intravenous Every 8 hours 01/27/18 2227 01/27/18 2243       Assessment/Plan MSSA bacteremia Osteomyelitis of RLE, s/p BKA H/o L BKA Endocarditis ARF, just off HD Paroxysmal A fib - on coumadin, INR 3.27 today DM-II PVD H/o MI  Acute cholecystitis - Patient with a positive HIDA scan consistent with acute cholecystitis. Recommend laparoscopic cholecystectomy. Discussed with internal medicine who feels that the patient is stable to undergo general anesthesia. Need INR to be <2 prior to proceeding with surgery so they are going to give him 2.2m vitamin K and repeat INR in AM. [facility], NPO after midnight. Also discussed broadening antibiotics with patient's internal medicine MD, zosyn if possible.   ID - currently daptomycin 9/25>> FEN - IVF, CLD, NPO after midnight VTE - coumadin Foley - none    LOS: 30 days    BWellington Hampshire, PEn[facility]-CitiesSur[facility] 02/26/2018, 2:47 PM Pager: [phone] 7:00 am -11:30 AM Tues-Fri 7:00 am-4:30 pm Sat-Sun 7:00 am-11:30 am

## 2018-02-26 NOTE — Progress Notes (Signed)
Patient ID: [name], male   DOB: [birth year], 62 y.o.   MRN: [id number] Patient in nuclear medicine getting HIDA scan.  Will evaluate after scan completed.  Repeat CBC pending to determine if WBC is truly 30K.  Henreitta Cea 10:37 AM 02/26/2018

## 2018-02-26 NOTE — Progress Notes (Signed)
ANTICOAGULATION CONSULT NOTE - Follow Up Consult  Pharmacy Consult for Coumadin  Indication: atrial fibrillation   Allergies  Allergen Reactions  . Ace Inhibitors Cough  . Cefazolin     Possible cause of AKI, AIN    Patient Measurements: Height: 5\' 10"  (177.8 cm) Weight: 233 lb 11 oz (106 kg) IBW/kg (Calculated) : 73  Vital Signs: Temp: 98.1 F (36.7 C) (10/23 0811) Temp Source: Oral (10/23 0811) BP: 116/52 (10/23 0811) Pulse Rate: 89 (10/23 0811)  Labs: Recent Labs    02/24/18 0336 02/25/18 0335 02/26/18 0350  HGB  --   --  9.1*  HCT  --   --  29.5*  PLT  --   --  236  LABPROT 31.3* 32.7* 32.8*  INR 3.08 3.25 3.27  CREATININE 2.25* 2.45* 2.66*    Estimated Creatinine Clearance: 35.6 mL/min (A) (by C-G formula based on SCr of 2.66 mg/dL (H)).  Assessment:  62 yo M with new onset PAF on Coumadin. Coumadin started on 10/1, achieved therapeutic INR, then held for [facility] placement. Received Vitamin K 1 mg on 10/7 and 10/8.   Coumadin resumed on 02/13/18 after [facility] placement.    INR = 3.27 today  Goal of Therapy:  INR 2-3 Monitor platelets by anticoagulation protocol: Yes   Plan:  Small dose of warfarin today - > 0.5 mg po x 1 Daily INR, CBC, s/s bleeding  Thank you Anette Guarneri, PharmD Clinical Pharmacist Please check AMION for all [place] numbers 02/26/2018 9:10 AM

## 2018-02-26 NOTE — Progress Notes (Addendum)
PROGRESS NOTE    [name]  UYQ:[id number] DOB: Jan 16, 1956 DOA: 01/27/2018 PCP: [name], MD    Brief Narrative:  62 y.o. male with a history of sepsis due to right foot osteomyelitis s/p transtibial amputation and found to have MSSA bacteremia with splenic infarct and tricuspid valve endocarditis. He was discharged on ancef to [facility] but was quickly brought back in due to oliguria and found to have acute renal failure. He developed acute respiratory failure and transferred to ICU on CCM service. Antibiotics changed to daptomycin per ID and nephrology oversaw hemodialysis through right IJ temporary catheter on 10/12 and 10/14. With improvement in respiratory status he was transferred to the medical floor. Nephrology following closely for signs of renal recovery. Creatinine trending downward as of 10/17 with increasing urine output. Temporary HD catheter removed 10/21. On 10/22 the patient developed acute abdominal pain worse on the right with nausea, vomiting, and inability to take po. CT abd/pelvis demonstrated distended gallbladder with gallstones concerning for cholecystitis. Also showed anasarca, ascites, and pleural effusions, progressive T12-L1 changes noticed on previous study, and stable splenic lesions.   Assessment & Plan:   Principal Problem:   Acute renal failure (ARF) (HCC) Active Problems:   Insulin-requiring or dependent type II diabetes mellitus ([place])   Essential hypertension   CAD (coronary artery disease)   Leukocytosis   Thrombocytopenia (HCC)   S/P BKA (below knee amputation) unilateral, right (HCC)   Endocarditis of tricuspid valve   Hyponatremia   PAF (paroxysmal atrial fibrillation) (HCC)   Aortic atherosclerosis (HCC)   Acute hypoxemic respiratory failure (HCC)  Oliguric acute renal failure: Due to ATN vs. ancef-induced AIN. Continued slow improvement until 10/22. CrCl 61m/min and UOP satisfactory. Pulled temporary catheter 10/21. Renal  function stable at this time. Repeat bmet in AM  Acute abdominal pain, nausea, vomiting secondary to acute cholecystitis: CT abd/pelvis with cholelithiasis and consistent changes with cholecystitis, though with diffuse edema/anasarca, could be false positive.  - HIDA positive for acute cholecystitis -General surgery following with plans for cholecystectomy -No chest pains. Seen earlier by Cardiology. Vital signs stable. -At this time, benefits to surgery would outweigh risks. -Coumadin on hold, pt given 2.541mvitamin K to reverse INR over 3. Goal pre-op INR of <2  Paroxysmal atrial fibrillation: New diagnosis during admission. Maintaining sinus rhythm. Cardiology consulted, signed off 10/3. - Continue coumadin dosed per pharmacy. - Continue metoprolol - Follow up with Dr. HoPercival Spanishn 3-4 weeks post discharge.  -Rate controlled at present  MSSA bacteremia and tricuspid valve endocarditis and splenic infarcts: Stopped ancef.  - Recommendations to complete daptomycin to complete 6 weeks Tx (through 11/2). - CK transiently mildly elevated but since resolved. Recheck CK weekly. - Due to continued T12-L1 lesion, seen on CT 10/22 progressing since previous exam, ID recommends starting doxycycline 10081mo BID on a full stomach following completion of daptomycin (initial C&S reviewed, was pansensitive). ID will arrange outpatient follow up to coordinate repeat imaging.  Diarrhea: No indication for CDiff testing. Possibly due to malabsorption/cholecystitis, though this symptom preceded abd pain/N/V.  - Patient is continued on probiotics 2 weeks (final dose 11/16) following conclusion of antibiotics.  PVD s/p bilateral transtibial amputations: Reevaluated by Dr. DudSharol Givenring this hospitalization, staples removed from recent right TTA on 10/15.  - Plans for prosthetic fitting as outpatient  Anemia of acute and chronic disease: Stable hgb.  - Got aranesp [place] 10/18. - Repeat cbc in AM  T12-L1  Discitis/osteomyelitis: Loss of cortex along anterior  inferior aspect of T12 vertebral body, lucency in posterior aspect of L1 vertebral body. Seems to have progressed since previous exam.  - D/w ID, will prolong antibiotics course with doxycycline as above.  - Will need MRI w/ and w/o contrast for further evaluation closer to conclusion of antibiotics. Not complaining of significant back pain. - Appears stable at this time   Acute hypoxic respiratory failure:  -Due to pulmonary edema. -Resolved. On minimal O2 support  Iron deficiency anemia and anemia of acute illness:  - Hgb stable at this time 8's. - Iron supplement continued  T2DM: A1c 8.5%.  - continue SSI coverage  Thrombocytopenia: Resolved.  Oral candidiasis: Treated with fluconazole  Obesity: BMI 34.  - Dietitian consulted  DVT prophylaxis: coumadin Code Status: Full Family Communication: Pt in room, family not at bedside Disposition Plan: SNF, timing uncertain  Consultants:   ID  IR  Cardiology  PCCM  Nephrology  General surgery  Procedures:   Temporary HD catheter  HD  Antimicrobials: Anti-infectives (From admission, onward)   Start     Dose/Rate Route Frequency Ordered Stop   02/24/18 0000  daptomycin (CUBICIN) IVPB     700 mg Intravenous Every 24 hours 02/24/18 1141 03/09/18 2359   02/23/18 2000  DAPTOmycin (CUBICIN) 700 mg in sodium chloride 0.9 % IVPB     700 mg 228 mL/hr over 30 Minutes Intravenous Every 24 hours 02/23/18 1233 03/08/18 2359   02/13/18 1415  ceFAZolin (ANCEF) powder 2 g     2 g Other To Surgery 02/13/18 1410 02/13/18 2142   02/13/18 1347  ceFAZolin (ANCEF) 2-4 GM/100ML-% IVPB    Note to Pharmacy:  Arlean Hopping   : cabinet override      02/13/18 1347 02/13/18 1410   01/30/18 2000  DAPTOmycin (CUBICIN) 700 mg in sodium chloride 0.9 % IVPB  Status:  Discontinued     700 mg 228 mL/hr over 30 Minutes Intravenous Every 48 hours 01/30/18 1306 02/23/18 1233   01/30/18  1000  fluconazole (DIFLUCAN) tablet 50 mg     50 mg Oral Daily 01/29/18 0930 02/04/18 0914   01/29/18 2000  DAPTOmycin (CUBICIN) 700 mg in sodium chloride 0.9 % IVPB  Status:  Discontinued     700 mg 228 mL/hr over 30 Minutes Intravenous Every 48 hours 01/29/18 1013 01/30/18 1306   01/29/18 1030  fluconazole (DIFLUCAN) tablet 100 mg     100 mg Oral  Once 01/29/18 0930 01/29/18 1121   01/28/18 2200  ceFAZolin (ANCEF) IVPB 1 g/50 mL premix  Status:  Discontinued     1 g 100 mL/hr over 30 Minutes Intravenous Every 24 hours 01/28/18 1007 01/28/18 1231   01/27/18 2245  ceFAZolin (ANCEF) IVPB 2g/100 mL premix  Status:  Discontinued     2 g 200 mL/hr over 30 Minutes Intravenous Every 12 hours 01/27/18 2243 01/28/18 1007   01/27/18 2230  ceFAZolin (ANCEF) IVPB  Status:  Discontinued    Note to Pharmacy:  Indication:  MSSA bacteremia/endocarditis Last Day of Therapy:  03/08/18 Labs - Once weekly:  CBC/D and BMP, Labs - Every other week:  ESR and CRP     2 g Intravenous Every 8 hours 01/27/18 2220 01/27/18 2227   01/27/18 2230  ceFAZolin (ANCEF) IVPB 2g/100 mL premix  Status:  Discontinued     2 g 200 mL/hr over 30 Minutes Intravenous Every 8 hours 01/27/18 2227 01/27/18 2243       Subjective: Complaining of RUQ pain and diarrhea  Objective: Vitals:   02/26/18 0506 02/26/18 0811 02/26/18 1159 02/26/18 1700  BP: 119/64 (!) 116/52 (!) 125/55 (!) 123/58  Pulse: 92 89 92 86  Resp: _0 Temp: 98.3 F (36.8 C) 98.1 F (36.7 C) 98.2 F (36.8 C) 98.4 F (36.9 C)  TempSrc: Oral Oral Oral Oral  SpO2: 97% 97% 96% 94%  Weight:      Height:        Intake/Output Summary (Last 24 hours) at 02/26/2018 1712 Last data filed at 02/26/2018 1300 Gross per 24 hour  Intake 235 ml  Output 0 ml  Net 235 ml   Filed Weights   02/23/18 0500 02/24/18 2223 02/26/18 0000  Weight: 108 kg 108 kg 106 kg    Examination:  General exam: Appears calm and comfortable  Respiratory system: Clear to  auscultation. Respiratory effort normal. Cardiovascular system: S1 & S2 heard, RRR Gastrointestinal system: Pos BS, generally tender, worse over RUQ Central nervous system: Alert and oriented. No focal neurological deficits. Extremities: Symmetric 5 x 5 power. Skin: No rashes, lesions Psychiatry: Judgement and insight appear normal. Mood & affect appropriate.   Data Reviewed: I have personally reviewed following labs and imaging studies  CBC: Recent Labs  Lab 02/21/18 0803 02/23/18 0338 02/26/18 0350 02/26/18 1208  WBC 9.8 9.5 30.3* 29.2*  HGB 8.6* 8.3* 9.1* 8.8*  HCT 28.2* 27.5* 29.5* 29.5*  MCV 91.0 90.8 89.7 90.5  PLT 229 225 236 818   Basic Metabolic Panel: Recent Labs  Lab 02/21/18 0803 02/22/18 0330 02/23/18 0338 02/24/18 0336 02/25/18 0335 02/26/18 0350  NA 136 136 139 140 136 135  K 3.9 3.6 3.5 3.3* 4.2 3.6  CL 104 103 106 110 105 104  CO2 _1 21* 21*  GLUCOSE 179* 130* 139* 68* 231* 191*  BUN 74* 77* 77* 70* 65* 72*  CREATININE 3.45* 3.18* 2.56* 2.25* 2.45* 2.66*  CALCIUM 7.8* 7.6* 7.8* 7.9* 8.1* 8.0*  PHOS 5.4* 5.2* 4.7* 4.1 3.8  --    GFR: Estimated Creatinine Clearance: 35.6 mL/min (A) (by C-G formula based on SCr of 2.66 mg/dL (H)). Liver Function Tests: Recent Labs  Lab 02/23/18 0338 02/24/18 0336 02/25/18 0335 02/25/18 2055 02/26/18 0350  AST  --   --   --  21 49*  ALT  --   --   --  23 25  ALKPHOS  --   --   --  140* 94  BILITOT  --   --   --  1.2 1.2  PROT  --   --   --  6.1* 6.2*  ALBUMIN 2.2* 2.2* 2.4* 2.3* 2.2*   No results for input(s): LIPASE, AMYLASE in the last 168 hours. No results for input(s): AMMONIA in the last 168 hours. Coagulation Profile: Recent Labs  Lab 02/22/18 0330 02/23/18 0338 02/24/18 0336 02/25/18 0335 02/26/18 0350  INR 3.36 2.99 3.08 3.25 3.27   Cardiac Enzymes: Recent Labs  Lab 02/20/18 0332 02/23/18 0338  CKTOTAL 429* 187   BNP (last 3 results) No results for input(s): PROBNP in the  last 8760 hours. HbA1C: No results for input(s): HGBA1C in the last 72 hours. CBG: Recent Labs  Lab 02/25/18 1648 02/25/18 2208 02/26/18 0545 02/26/18 0809 02/26/18 1201  GLUCAP 147* 175* 176* 173* 165*   Lipid Profile: No results for input(s): CHOL, HDL, LDLCALC, TRIG, CHOLHDL, LDLDIRECT in the last 72 hours. Thyroid Function Tests: No results for input(s): TSH, T4TOTAL, FREET4, T3FREE, THYROIDAB in the  last 72 hours. Anemia Panel: No results for input(s): VITAMINB12, FOLATE, FERRITIN, TIBC, IRON, RETICCTPCT in the last 72 hours. Sepsis Labs: No results for input(s): PROCALCITON, LATICACIDVEN in the last 168 hours.  Recent Results (from the past 240 hour(s))  Gastrointestinal Panel by PCR , Stool     Status: None   Collection Time: 02/18/18 12:18 PM  Result Value Ref Range Status   Campylobacter species NOT DETECTED NOT DETECTED Final   Plesimonas shigelloides NOT DETECTED NOT DETECTED Final   Salmonella species NOT DETECTED NOT DETECTED Final   Yersinia enterocolitica NOT DETECTED NOT DETECTED Final   Vibrio species NOT DETECTED NOT DETECTED Final   Vibrio cholerae NOT DETECTED NOT DETECTED Final   Enteroaggregative E coli (EAEC) NOT DETECTED NOT DETECTED Final   Enteropathogenic E coli (EPEC) NOT DETECTED NOT DETECTED Final   Enterotoxigenic E coli (ETEC) NOT DETECTED NOT DETECTED Final   Shiga like toxin producing E coli (STEC) NOT DETECTED NOT DETECTED Final   Shigella/Enteroinvasive E coli (EIEC) NOT DETECTED NOT DETECTED Final   Cryptosporidium NOT DETECTED NOT DETECTED Final   Cyclospora cayetanensis NOT DETECTED NOT DETECTED Final   Entamoeba histolytica NOT DETECTED NOT DETECTED Final   Giardia lamblia NOT DETECTED NOT DETECTED Final   Adenovirus F40/41 NOT DETECTED NOT DETECTED Final   Astrovirus NOT DETECTED NOT DETECTED Final   Norovirus GI/GII NOT DETECTED NOT DETECTED Final   Rotavirus A NOT DETECTED NOT DETECTED Final   Sapovirus (I, II, IV, and V) NOT  DETECTED NOT DETECTED Final    Comment: Performed at [facility], [address] [place], [place] [address]     Radiology Studies: Ct Abdomen Pelvis Wo Contrast  Result Date: 02/25/2018 CLINICAL DATA:  Pt is septic with nausea, vomiting, abdomen pain. eval for gastroenteritis and colitis EXAM: CT ABDOMEN AND PELVIS WITHOUT CONTRAST TECHNIQUE: Multidetector CT imaging of the abdomen and pelvis was performed following the standard protocol without IV contrast. COMPARISON:  01/29/2018, 01/14/2018 FINDINGS: Lower chest: There are increased bilateral pleural effusions, associated with bibasilar atelectasis. There is dense atherosclerotic calcification of coronary arteries. Hepatobiliary: Liver is diffusely low attenuation. No focal liver lesions are identified. There is periportal edema. Gallbladder is distended compare with prior studies. Layering, depending calcified gallstones are present. Pancreas: Unremarkable. No pancreatic ductal dilatation or surrounding inflammatory changes. Spleen: Low-attenuation lesion within the posterior aspect of the spleen has been better demonstrated on previous contrast-enhanced exams. The spleen is UPPER limits normal in size. Adrenals/Urinary Tract: The adrenal glands are normal in appearance. No intrarenal calculi and or ureteral obstruction. No renal mass. There is air within the urinary bladder raising the question of infectious process or recent catheterization. Stomach/Bowel: The stomach and small bowel loops are normal in appearance. The appendix is well seen and has a normal appearance. Loops of colon are normal in caliber and wall thickness. No acute diverticulitis or wall thickening. Vascular/Lymphatic: There is atherosclerotic calcification of the abdominal aorta and its branches. No aneurysm. No retroperitoneal or mesenteric adenopathy. Reproductive: Prostate is unremarkable. Densely calcified vas deferens. Other: Small amount of ascites in the pelvis,  perihepatic region, and paracolic gutters. There is diffuse body wall edema and mesenteric edema. Musculoskeletal: There is persistent sclerosis, irregularity, and destruction of the RIGHT aspect of T12 and L1, suspicious for osteomyelitis/discitis. There is further destruction since the study of 01/14/2018. Degenerative changes are noted at L4-5. IMPRESSION: 1. Increased bilateral pleural effusions, anasarca, and ascites. 2. Distended gallbladder and gallstones raising the question of acute  cholecystitis. 3. Air within the urinary bladder suspicious for infection or recent catheterization. 4.  Aortic atherosclerosis.  (ICD10-I70.0) 5. Changes at T12-L1 suspicious for discitis/osteomyelitis, progressed since study performed September 10th. Consider further evaluation with MRI. 6. No evidence for colitis or gastroenteritis. No urinary tract obstruction. 7. Hepatic steatosis. 8. Stable splenic lesion, possibly representing infarct. Electronically Signed   By: Nolon Nations M.D.   On: 02/25/2018 14:54   Nm Hepatobiliary Liver Func  Result Date: 02/26/2018 CLINICAL DATA:  Abdominal pain. Abnormal gallbladder on a recent CT. EXAM: NUCLEAR MEDICINE HEPATOBILIARY IMAGING TECHNIQUE: Sequential images of the abdomen were obtained out to 60 minutes following intravenous administration of radiopharmaceutical. RADIOPHARMACEUTICALS:  5.15 mCi Tc-77m Choletec IV COMPARISON:  CT, 02/25/2018 FINDINGS: There is prompt radiotracer accumulation by the liver. Prompt excretion is seen into the intra and extrahepatic biliary tree with small bowel activity seen early during the exam. No gallbladder activity was seen after 1 hour of imaging following radiotracer injection. The patient was then injected with 3 mg of morphine to induce sphincter of Odi spasm followed by additional imaging. No gallbladder activity was documented on the follow-up imaging. IMPRESSION: 1. Nonvisualization of the gallbladder consistent with an obstructed  cystic duct and acute cholecystitis. Electronically Signed   By: DLajean ManesM.D.   On: 02/26/2018 13:19    Scheduled Meds: . Chlorhexidine Gluconate Cloth  6 each Topical Q0600  . feeding supplement  1 Container Oral TID WC  . insulin aspart  0-5 Units Subcutaneous QHS  . insulin aspart  0-9 Units Subcutaneous TID WC  . insulin aspart  4 Units Subcutaneous TID WC  . insulin glargine  22 Units Subcutaneous QHS  . latanoprost  1 drop Both Eyes QHS  . mouth rinse  15 mL Mouth Rinse BID  . metoprolol tartrate  50 mg Oral BID  . multivitamin  1 tablet Oral QHS  . nystatin   Topical BID  . pantoprazole  40 mg Oral Q1200  . saccharomyces boulardii  250 mg Oral BID  . traZODone  50 mg Oral QHS  . Warfarin - Pharmacist Dosing Inpatient   Does not apply q1800   Continuous Infusions: . sodium chloride Stopped (02/20/18 0530)  . DAPTOmycin (CUBICIN)  IV 700 mg (02/25/18 2137)     LOS: 30 days   SMarylu Lund MD [facility] Pager On Amion  If 7PM-7AM, please contact night-coverage 02/26/2018, 5:12 PM

## 2018-02-27 DIAGNOSIS — E669 Obesity, unspecified: Secondary | ICD-10-CM

## 2018-02-27 DIAGNOSIS — E1169 Type 2 diabetes mellitus with other specified complication: Secondary | ICD-10-CM

## 2018-02-27 LAB — URINALYSIS, ROUTINE W REFLEX MICROSCOPIC
Bilirubin Urine: NEGATIVE
Glucose, UA: NEGATIVE mg/dL
KETONES UR: NEGATIVE mg/dL
Nitrite: NEGATIVE
PH: 5 (ref 5.0–8.0)
Protein, ur: 100 mg/dL — AB
Specific Gravity, Urine: 1.012 (ref 1.005–1.030)

## 2018-02-27 LAB — COMPREHENSIVE METABOLIC PANEL
ALK PHOS: 106 U/L (ref 38–126)
ALT: 27 U/L (ref 0–44)
ANION GAP: 10 (ref 5–15)
AST: 51 U/L — ABNORMAL HIGH (ref 15–41)
Albumin: 1.9 g/dL — ABNORMAL LOW (ref 3.5–5.0)
BILIRUBIN TOTAL: 1 mg/dL (ref 0.3–1.2)
BUN: 84 mg/dL — ABNORMAL HIGH (ref 8–23)
CALCIUM: 7.7 mg/dL — AB (ref 8.9–10.3)
CO2: 19 mmol/L — ABNORMAL LOW (ref 22–32)
Chloride: 105 mmol/L (ref 98–111)
Creatinine, Ser: 3.34 mg/dL — ABNORMAL HIGH (ref 0.61–1.24)
GFR, EST AFRICAN AMERICAN: 21 mL/min — AB (ref >60)
GFR, EST NON AFRICAN AMERICAN: 18 mL/min — AB (ref >60)
GLUCOSE: 178 mg/dL — AB (ref 70–99)
Potassium: 3.4 mmol/L — ABNORMAL LOW (ref 3.5–5.1)
Sodium: 134 mmol/L — ABNORMAL LOW (ref 135–145)
TOTAL PROTEIN: 5.5 g/dL — AB (ref 6.5–8.1)

## 2018-02-27 LAB — GLUCOSE, CAPILLARY
GLUCOSE-CAPILLARY: 125 mg/dL — AB (ref 70–99)
GLUCOSE-CAPILLARY: 98 mg/dL (ref 70–99)
Glucose-Capillary: 148 mg/dL — ABNORMAL HIGH (ref 70–99)
Glucose-Capillary: 103 mg/dL — ABNORMAL HIGH (ref 70–99)

## 2018-02-27 LAB — PROTIME-INR
INR: 2.57
Prothrombin Time: 27.3 seconds — ABNORMAL HIGH (ref 11.4–15.2)

## 2018-02-27 LAB — CK: CK TOTAL: 169 U/L (ref 49–397)

## 2018-02-27 MED ORDER — SODIUM CHLORIDE 0.9 % IV SOLN
INTRAVENOUS | Status: DC
  Administered 2018-02-27 – 2018-02-28 (×4): via INTRAVENOUS

## 2018-02-27 NOTE — Progress Notes (Signed)
PT Cancellation Note  Patient Details Name: [name] MRN: [id number] DOB: 08-16-1955   Cancelled Treatment:    Reason Eval/Treat Not Completed: Patient declined, no reason specified. Max encouragement provided for pt to participate with PT, however pt declined. States "I just don't feel like it today." Appears to be concerned about pending surgery. Will continue to follow and progress as able per POC.    [name] 02/27/2018, 1:45 PM  [name], PT, DPT Acute Rehabilitation Services Pager: [phone] Office: [phone]

## 2018-02-27 NOTE — Progress Notes (Signed)
Subjective:  Pt previously followed by nephrology consult service- normal crt in early September but then presented on 9/24  with crt of 9 and was dialysis requiring transiently- working diagnosis was AIN vs ATN- signed off on 10/20- crt cont to improve- down to 2.25 on 10/21 but then started to rise again, now over 3 so will re establish consult. No obvious nephrotoxins ID'd- BP soft but not that low- now diagnosed with cholecystitis and going to have chole per surgery - on rocephin since 10/23 also on dapto and flagyl  Objective Vital signs in last 24 hours: Vitals:   02/26/18 1700 02/26/18 2049 02/27/18 0522 02/27/18 0849  BP: (!) 123/58 114/60 112/62 (!) 116/59  Pulse: 86 86 87 89  Resp: 18 18 18 18   Temp: 98.4 F (36.9 C) 98 F (36.7 C) 98.2 F (36.8 C) 98.3 F (36.8 C)  TempSrc: Oral Oral Oral Oral  SpO2: 94% 96% 100% 96%  Weight:  106.3 kg    Height:       Weight change: 0.3 kg  Intake/Output Summary (Last 24 hours) at 02/27/2018 1244 Last data filed at 02/27/2018 0900 Gross per 24 hour  Intake 714.59 ml  Output 200 ml  Net 514.59 ml    Assessment/ Plan: Pt is a 62 y.o. yo male who was admitted on 01/27/2018 with AKI req transient HD- thought to be due to AIN vs ATN- now kidney function trending worse again with decreased UOP ? Assessment/Plan: 1. Renal- prior AKI thought due to AIN vs ATN- ancef was stopped and put on dapto.  Kidneys appeared to recover but then started trending worse again.  Has been NPO - mucous membranes dry but still with pitting edema- I dont think would hurt to give IVF- will check U/A- consider rocephin as a source of recurrent AIN and try to manipulate if possible if crt does not stabilize tomorrow 2. HTN/vol-give some low level IVF 3. Anemia- ckd and hosp- has received ESA- nothing for now 4. Cholecystitis- abx and surgery    [name]    Labs: Basic Metabolic Panel: Recent Labs  Lab 02/23/18 0338 02/24/18 0336 02/25/18 0335  02/26/18 0350 02/27/18 0402  NA 139 140 136 135 134*  K 3.5 3.3* 4.2 3.6 3.4*  CL 106 110 105 104 105  CO2 24 24 21* 21* 19*  GLUCOSE 139* 68* 231* 191* 178*  BUN 77* 70* 65* 72* 84*  CREATININE 2.56* 2.25* 2.45* 2.66* 3.34*  CALCIUM 7.8* 7.9* 8.1* 8.0* 7.7*  PHOS 4.7* 4.1 3.8  --   --    Liver Function Tests: Recent Labs  Lab 02/25/18 2055 02/26/18 0350 02/27/18 0402  AST 21 49* 51*  ALT 23 25 27   ALKPHOS 140* 94 106  BILITOT 1.2 1.2 1.0  PROT 6.1* 6.2* 5.5*  ALBUMIN 2.3* 2.2* 1.9*   No results for input(s): LIPASE, AMYLASE in the last 168 hours. No results for input(s): AMMONIA in the last 168 hours. CBC: Recent Labs  Lab 02/21/18 0803 02/23/18 0338 02/26/18 0350 02/26/18 1208  WBC 9.8 9.5 30.3* 29.2*  HGB 8.6* 8.3* 9.1* 8.8*  HCT 28.2* 27.5* 29.5* 29.5*  MCV 91.0 90.8 89.7 90.5  PLT 229 225 236 237   Cardiac Enzymes: Recent Labs  Lab 02/23/18 0338 02/27/18 0402  CKTOTAL 187 169   CBG: Recent Labs  Lab 02/26/18 1201 02/26/18 1701 02/26/18 2049 02/27/18 0849 02/27/18 1153  GLUCAP 165* 168* 191* 148* 125*    Iron Studies: No results  for input(s): IRON, TIBC, TRANSFERRIN, FERRITIN in the last 72 hours. Studies/Results: Ct Abdomen Pelvis Wo Contrast  Result Date: 02/25/2018 CLINICAL DATA:  Pt is septic with nausea, vomiting, abdomen pain. eval for gastroenteritis and colitis EXAM: CT ABDOMEN AND PELVIS WITHOUT CONTRAST TECHNIQUE: Multidetector CT imaging of the abdomen and pelvis was performed following the standard protocol without IV contrast. COMPARISON:  01/29/2018, 01/14/2018 FINDINGS: Lower chest: There are increased bilateral pleural effusions, associated with bibasilar atelectasis. There is dense atherosclerotic calcification of coronary arteries. Hepatobiliary: Liver is diffusely low attenuation. No focal liver lesions are identified. There is periportal edema. Gallbladder is distended compare with prior studies. Layering, depending calcified  gallstones are present. Pancreas: Unremarkable. No pancreatic ductal dilatation or surrounding inflammatory changes. Spleen: Low-attenuation lesion within the posterior aspect of the spleen has been better demonstrated on previous contrast-enhanced exams. The spleen is UPPER limits normal in size. Adrenals/Urinary Tract: The adrenal glands are normal in appearance. No intrarenal calculi and or ureteral obstruction. No renal mass. There is air within the urinary bladder raising the question of infectious process or recent catheterization. Stomach/Bowel: The stomach and small bowel loops are normal in appearance. The appendix is well seen and has a normal appearance. Loops of colon are normal in caliber and wall thickness. No acute diverticulitis or wall thickening. Vascular/Lymphatic: There is atherosclerotic calcification of the abdominal aorta and its branches. No aneurysm. No retroperitoneal or mesenteric adenopathy. Reproductive: Prostate is unremarkable. Densely calcified vas deferens. Other: Small amount of ascites in the pelvis, perihepatic region, and paracolic gutters. There is diffuse body wall edema and mesenteric edema. Musculoskeletal: There is persistent sclerosis, irregularity, and destruction of the RIGHT aspect of T12 and L1, suspicious for osteomyelitis/discitis. There is further destruction since the study of 01/14/2018. Degenerative changes are noted at L4-5. IMPRESSION: 1. Increased bilateral pleural effusions, anasarca, and ascites. 2. Distended gallbladder and gallstones raising the question of acute cholecystitis. 3. Air within the urinary bladder suspicious for infection or recent catheterization. 4.  Aortic atherosclerosis.  (ICD10-I70.0) 5. Changes at T12-L1 suspicious for discitis/osteomyelitis, progressed since study performed September 10th. Consider further evaluation with MRI. 6. No evidence for colitis or gastroenteritis. No urinary tract obstruction. 7. Hepatic steatosis. 8. Stable  splenic lesion, possibly representing infarct. Electronically Signed   By: Nolon Nations M.D.   On: 02/25/2018 14:54   Nm Hepatobiliary Liver Func  Result Date: 02/26/2018 CLINICAL DATA:  Abdominal pain. Abnormal gallbladder on a recent CT. EXAM: NUCLEAR MEDICINE HEPATOBILIARY IMAGING TECHNIQUE: Sequential images of the abdomen were obtained out to 60 minutes following intravenous administration of radiopharmaceutical. RADIOPHARMACEUTICALS:  5.15 mCi Tc-51m  Choletec IV COMPARISON:  CT, 02/25/2018 FINDINGS: There is prompt radiotracer accumulation by the liver. Prompt excretion is seen into the intra and extrahepatic biliary tree with small bowel activity seen early during the exam. No gallbladder activity was seen after 1 hour of imaging following radiotracer injection. The patient was then injected with 3 mg of morphine to induce sphincter of Odi spasm followed by additional imaging. No gallbladder activity was documented on the follow-up imaging. IMPRESSION: 1. Nonvisualization of the gallbladder consistent with an obstructed cystic duct and acute cholecystitis. Electronically Signed   By: Lajean Manes M.D.   On: 02/26/2018 13:19   Medications: Infusions: . sodium chloride 1,000 mL (02/26/18 2029)  . cefTRIAXone (ROCEPHIN)  IV 2 g (02/26/18 2209)  . metronidazole 500 mg (02/27/18 1142)    Scheduled Medications: . Chlorhexidine Gluconate Cloth  6 each Topical Q0600  . feeding  supplement  1 Container Oral TID WC  . insulin aspart  0-5 Units Subcutaneous QHS  . insulin aspart  0-9 Units Subcutaneous TID WC  . insulin aspart  4 Units Subcutaneous TID WC  . insulin glargine  22 Units Subcutaneous QHS  . latanoprost  1 drop Both Eyes QHS  . mouth rinse  15 mL Mouth Rinse BID  . metoprolol tartrate  50 mg Oral BID  . multivitamin  1 tablet Oral QHS  . nystatin   Topical BID  . pantoprazole  40 mg Oral Q1200  . saccharomyces boulardii  250 mg Oral BID  . traZODone  50 mg Oral QHS  .  Warfarin - Pharmacist Dosing Inpatient   Does not apply q1800    have reviewed scheduled and prn medications.  Physical Exam: General: alert, NAD- dry mucosa Heart: RRR Lungs:clear Abdomen: soft, non tender Extremities: pitting dep edema    02/27/2018,12:44 PM  LOS: 31 days

## 2018-02-27 NOTE — Progress Notes (Signed)
Patient ID: [name], male   DOB: 1956/01/30, 62 y.o.   MRN: [id number]       Subjective: No new complaints.  Still with pain in RUQ.  Nausea is improved  Objective: Vital signs in last 24 hours: Temp:  [98 F (36.7 C)-98.4 F (36.9 C)] 98.3 F (36.8 C) (10/24 0849) Pulse Rate:  [86-92] 89 (10/24 0849) Resp:  [18] 18 (10/24 0849) BP: (112-125)/(55-62) 116/59 (10/24 0849) SpO2:  [94 %-100 %] 96 % (10/24 0849) Weight:  [106.3 kg] 106.3 kg (10/23 2049) Last BM Date: 02/26/18  Intake/Output from previous day: 10/23 0701 - 10/24 0700 In: 714.6 [P.O.:345; I.V.:69.6; IV Piggyback:300] Out: 200 [Urine:200] Intake/Output this shift: No intake/output data recorded.  PE: Heart: irregular Lungs: CTAB Abd: soft, tender more focally in RUQ, +BS, ND  Lab Results:  Recent Labs    02/26/18 0350 02/26/18 1208  WBC 30.3* 29.2*  HGB 9.1* 8.8*  HCT 29.5* 29.5*  PLT 236 237   BMET Recent Labs    02/26/18 0350 02/27/18 0402  NA 135 134*  K 3.6 3.4*  CL 104 105  CO2 21* 19*  GLUCOSE 191* 178*  BUN 72* 84*  CREATININE 2.66* 3.34*  CALCIUM 8.0* 7.7*   PT/INR Recent Labs    02/26/18 0350 02/27/18 0402  LABPROT 32.8* 27.3*  INR 3.27 2.57   CMP     Component Value Date/Time   NA 134 (L) 02/27/2018 0402   K 3.4 (L) 02/27/2018 0402   CL 105 02/27/2018 0402   CO2 19 (L) 02/27/2018 0402   GLUCOSE 178 (H) 02/27/2018 0402   BUN 84 (H) 02/27/2018 0402   CREATININE 3.34 (H) 02/27/2018 0402   CALCIUM 7.7 (L) 02/27/2018 0402   PROT 5.5 (L) 02/27/2018 0402   ALBUMIN 1.9 (L) 02/27/2018 0402   AST 51 (H) 02/27/2018 0402   ALT 27 02/27/2018 0402   ALKPHOS 106 02/27/2018 0402   BILITOT 1.0 02/27/2018 0402   GFRNONAA 18 (L) 02/27/2018 0402   GFRAA 21 (L) 02/27/2018 0402   Lipase     Component Value Date/Time   LIPASE 23 01/14/2018 1926       Studies/Results: Ct Abdomen Pelvis Wo Contrast  Result Date: 02/25/2018 CLINICAL DATA:  Pt is septic with nausea,  vomiting, abdomen pain. eval for gastroenteritis and colitis EXAM: CT ABDOMEN AND PELVIS WITHOUT CONTRAST TECHNIQUE: Multidetector CT imaging of the abdomen and pelvis was performed following the standard protocol without IV contrast. COMPARISON:  01/29/2018, 01/14/2018 FINDINGS: Lower chest: There are increased bilateral pleural effusions, associated with bibasilar atelectasis. There is dense atherosclerotic calcification of coronary arteries. Hepatobiliary: Liver is diffusely low attenuation. No focal liver lesions are identified. There is periportal edema. Gallbladder is distended compare with prior studies. Layering, depending calcified gallstones are present. Pancreas: Unremarkable. No pancreatic ductal dilatation or surrounding inflammatory changes. Spleen: Low-attenuation lesion within the posterior aspect of the spleen has been better demonstrated on previous contrast-enhanced exams. The spleen is UPPER limits normal in size. Adrenals/Urinary Tract: The adrenal glands are normal in appearance. No intrarenal calculi and or ureteral obstruction. No renal mass. There is air within the urinary bladder raising the question of infectious process or recent catheterization. Stomach/Bowel: The stomach and small bowel loops are normal in appearance. The appendix is well seen and has a normal appearance. Loops of colon are normal in caliber and wall thickness. No acute diverticulitis or wall thickening. Vascular/Lymphatic: There is atherosclerotic calcification of the abdominal aorta and its branches. No aneurysm.  No retroperitoneal or mesenteric adenopathy. Reproductive: Prostate is unremarkable. Densely calcified vas deferens. Other: Small amount of ascites in the pelvis, perihepatic region, and paracolic gutters. There is diffuse body wall edema and mesenteric edema. Musculoskeletal: There is persistent sclerosis, irregularity, and destruction of the RIGHT aspect of T12 and L1, suspicious for osteomyelitis/discitis.  There is further destruction since the study of 01/14/2018. Degenerative changes are noted at L4-5. IMPRESSION: 1. Increased bilateral pleural effusions, anasarca, and ascites. 2. Distended gallbladder and gallstones raising the question of acute cholecystitis. 3. Air within the urinary bladder suspicious for infection or recent catheterization. 4.  Aortic atherosclerosis.  (ICD10-I70.0) 5. Changes at T12-L1 suspicious for discitis/osteomyelitis, progressed since study performed September 10th. Consider further evaluation with MRI. 6. No evidence for colitis or gastroenteritis. No urinary tract obstruction. 7. Hepatic steatosis. 8. Stable splenic lesion, possibly representing infarct. Electronically Signed   By: Nolon Nations M.D.   On: 02/25/2018 14:54   Nm Hepatobiliary Liver Func  Result Date: 02/26/2018 CLINICAL DATA:  Abdominal pain. Abnormal gallbladder on a recent CT. EXAM: NUCLEAR MEDICINE HEPATOBILIARY IMAGING TECHNIQUE: Sequential images of the abdomen were obtained out to 60 minutes following intravenous administration of radiopharmaceutical. RADIOPHARMACEUTICALS:  5.15 mCi Tc-50m Choletec IV COMPARISON:  CT, 02/25/2018 FINDINGS: There is prompt radiotracer accumulation by the liver. Prompt excretion is seen into the intra and extrahepatic biliary tree with small bowel activity seen early during the exam. No gallbladder activity was seen after 1 hour of imaging following radiotracer injection. The patient was then injected with 3 mg of morphine to induce sphincter of Odi spasm followed by additional imaging. No gallbladder activity was documented on the follow-up imaging. IMPRESSION: 1. Nonvisualization of the gallbladder consistent with an obstructed cystic duct and acute cholecystitis. Electronically Signed   By: DLajean ManesM.D.   On: 02/26/2018 13:19    Anti-infectives: Anti-infectives (From admission, onward)   Start     Dose/Rate Route Frequency Ordered Stop   02/26/18 2000   cefTRIAXone (ROCEPHIN) 2 g in sodium chloride 0.9 % 100 mL IVPB     2 g 200 mL/hr over 30 Minutes Intravenous Every 24 hours 02/26/18 1911     02/26/18 2000  metroNIDAZOLE (FLAGYL) IVPB 500 mg     500 mg 100 mL/hr over 60 Minutes Intravenous Every 8 hours 02/26/18 1911     02/24/18 0000  daptomycin (CUBICIN) IVPB     700 mg Intravenous Every 24 hours 02/24/18 1141 03/09/18 2359   02/23/18 2000  DAPTOmycin (CUBICIN) 700 mg in sodium chloride 0.9 % IVPB  Status:  Discontinued     700 mg 228 mL/hr over 30 Minutes Intravenous Every 24 hours 02/23/18 1233 02/26/18 1911   02/13/18 1415  ceFAZolin (ANCEF) powder 2 g     2 g Other To Surgery 02/13/18 1410 02/13/18 2142   02/13/18 1347  ceFAZolin (ANCEF) 2-4 GM/100ML-% IVPB    Note to Pharmacy:  JArlean Hopping  : cabinet override      02/13/18 1347 02/13/18 1410   01/30/18 2000  DAPTOmycin (CUBICIN) 700 mg in sodium chloride 0.9 % IVPB  Status:  Discontinued     700 mg 228 mL/hr over 30 Minutes Intravenous Every 48 hours 01/30/18 1306 02/23/18 1233   01/30/18 1000  fluconazole (DIFLUCAN) tablet 50 mg     50 mg Oral Daily 01/29/18 0930 02/04/18 0914   01/29/18 2000  DAPTOmycin (CUBICIN) 700 mg in sodium chloride 0.9 % IVPB  Status:  Discontinued  700 mg 228 mL/hr over 30 Minutes Intravenous Every 48 hours 01/29/18 1013 01/30/18 1306   01/29/18 1030  fluconazole (DIFLUCAN) tablet 100 mg     100 mg Oral  Once 01/29/18 0930 01/29/18 1121   01/28/18 2200  ceFAZolin (ANCEF) IVPB 1 g/50 mL premix  Status:  Discontinued     1 g 100 mL/hr over 30 Minutes Intravenous Every 24 hours 01/28/18 1007 01/28/18 1231   01/27/18 2245  ceFAZolin (ANCEF) IVPB 2g/100 mL premix  Status:  Discontinued     2 g 200 mL/hr over 30 Minutes Intravenous Every 12 hours 01/27/18 2243 01/28/18 1007   01/27/18 2230  ceFAZolin (ANCEF) IVPB  Status:  Discontinued    Note to Pharmacy:  Indication:  MSSA bacteremia/endocarditis Last Day of Therapy:  03/08/18 Labs - Once  weekly:  CBC/D and BMP, Labs - Every other week:  ESR and CRP     2 g Intravenous Every 8 hours 01/27/18 2220 01/27/18 2227   01/27/18 2230  ceFAZolin (ANCEF) IVPB 2g/100 mL premix  Status:  Discontinued     2 g 200 mL/hr over 30 Minutes Intravenous Every 8 hours 01/27/18 2227 01/27/18 2243       Assessment/Plan MSSA bacteremia Osteomyelitis of RLE, s/p BKA H/o L BKA Endocarditis ARF, just off HD Paroxysmal A fib - on coumadin, INR 2.5 DM-II PVD H/o MI  Acute cholecystitis - HIDA positive.  Plan to proceed with lap chole +/- IOC when INR under 2.  INR 2.5 today.  Cont to hold coumadin and correct per medicine service.   -clear liquids today, NPO P MN for OR hopefully tomorrow.   ID - currently daptomycin 9/25>>  Rocephin FEN - IVF, CLD, NPO after midnight VTE - coumadin on hold Foley - none   LOS: 31 days    Henreitta Cea , [facility] Surgery 02/27/2018, 10:14 AM Pager: [phone]

## 2018-02-27 NOTE — Progress Notes (Signed)
ANTICOAGULATION CONSULT NOTE - Follow Up Consult  Pharmacy Consult for Coumadin  Indication: atrial fibrillation   Allergies  Allergen Reactions  . Ace Inhibitors Cough  . Cefazolin     Possible cause of AKI, AIN    Patient Measurements: Height: 5\' 10"  (177.8 cm) Weight: 234 lb 5.6 oz (106.3 kg) IBW/kg (Calculated) : 73  Vital Signs: Temp: 98.3 F (36.8 C) (10/24 0849) Temp Source: Oral (10/24 0849) BP: 116/59 (10/24 0849) Pulse Rate: 89 (10/24 0849)  Labs: Recent Labs    02/25/18 0335 02/26/18 0350 02/26/18 1208 02/27/18 0402  HGB  --  9.1* 8.8*  --   HCT  --  29.5* 29.5*  --   PLT  --  236 237  --   LABPROT 32.7* 32.8*  --  27.3*  INR 3.25 3.27  --  2.57  CREATININE 2.45* 2.66*  --  3.34*  CKTOTAL  --   --   --  169    Estimated Creatinine Clearance: 28.4 mL/min (A) (by C-G formula based on SCr of 3.34 mg/dL (H)).  Assessment:  62 yo M with new onset PAF on Coumadin. Coumadin started on 10/1, achieved therapeutic INR, then held for [facility] placement. Received Vitamin K 1 mg on 10/7 and 10/8.   Coumadin resumed on 02/13/18 after [facility] placement - then held and reversed yet again on 10/23 for plans for lap chole once INR<2.   INR today has trended down and Is within the therapeutic range (INR 2.57 << 3.27, goal of 2-3). Surgery planning to take for lap chole once INR <2. Will continue to hold doses. Noted Flagyl added 10/23 which will increase INR sensitivity.   Goal of Therapy:  INR 2-3 Monitor platelets by anticoagulation protocol: Yes   Plan:  - Hold Warfarin - Surgery wanting INR<2 for lap chole - Will follow-up restart plans post-procedure   Thank you for allowing pharmacy to be a part of this patient's care.  Alycia Rossetti, PharmD, BCPS Clinical Pharmacist Pager: [phone] Clinical phone for 02/27/2018 from 7a-3:30p: [phone] If after 3:30p, please call main pharmacy at: [phone] Please check AMION for all [place] numbers 02/27/2018 11:08 AM

## 2018-02-27 NOTE — Progress Notes (Signed)
PROGRESS NOTE    Korbyn Chopin  LKT:[id number] DOB: 01-15-1956 DOA: 01/27/2018 PCP: [name], MD    Brief Narrative:  62 y.o. male with a history of sepsis due to right foot osteomyelitis s/p transtibial amputation and found to have MSSA bacteremia with splenic infarct and tricuspid valve endocarditis. He was discharged on ancef to [facility] but was quickly brought back in due to oliguria and found to have acute renal failure. He developed acute respiratory failure and transferred to ICU on CCM service. Antibiotics changed to daptomycin per ID and nephrology oversaw hemodialysis through right IJ temporary catheter on 10/12 and 10/14. With improvement in respiratory status he was transferred to the medical floor. Nephrology following closely for signs of renal recovery. Creatinine trending downward as of 10/17 with increasing urine output. Temporary HD catheter removed 10/21. On 10/22 the patient developed acute abdominal pain worse on the right with nausea, vomiting, and inability to take po. CT abd/pelvis demonstrated distended gallbladder with gallstones concerning for cholecystitis. Also showed anasarca, ascites, and pleural effusions, progressive T12-L1 changes noticed on previous study, and stable splenic lesions.   Assessment & Plan:   Principal Problem:   Acute renal failure (ARF) (HCC) Active Problems:   Insulin-requiring or dependent type II diabetes mellitus ([place] Colon)   Essential hypertension   CAD (coronary artery disease)   Leukocytosis   Thrombocytopenia (HCC)   S/P BKA (below knee amputation) unilateral, right (HCC)   Endocarditis of tricuspid valve   Hyponatremia   PAF (paroxysmal atrial fibrillation) (HCC)   Aortic atherosclerosis (HCC)   Acute hypoxemic respiratory failure (HCC)  Oliguric acute renal failure: Due to ATN vs. ancef-induced AIN. Continued slow improvement until 10/22. CrCl 63m/min and UOP satisfactory. Pulled temporary catheter 10/21. Renal  function is worsening. Nephrology following. Mucus membranes   Acute abdominal pain, nausea, vomiting secondary to acute cholecystitis: CT abd/pelvis with cholelithiasis and consistent changes with cholecystitis, though with diffuse edema/anasarca, could be false positive.  - HIDA positive for acute cholecystitis -General surgery following with plans for cholecystectomy -No chest pains. Seen earlier by Cardiology. Vital signs stable. -Benefits to surgery would outweigh risks at this time -Coumadin on hold, pt given 2.527mvitamin K to reverse INR over 3. Goal pre-op INR of <2 -INR remains over 2. Continue to hold coumadin. Repeat INR in AM  Paroxysmal atrial fibrillation: New diagnosis during admission. Maintaining sinus rhythm. Cardiology consulted, signed off 10/3. - Continue coumadin dosed per pharmacy. - Continue metoprolol - Follow up with Dr. HoPercival Spanishn 3-4 weeks post discharge.  -currently rate controlledt  MSSA bacteremia and tricuspid valve endocarditis and splenic infarcts: Stopped ancef.  - Recommendations to complete daptomycin to complete 6 weeks Tx (through 11/2). - CK transiently mildly elevated but since resolved. Recheck CK weekly. - Due to continued T12-L1 lesion, seen on CT 10/22 progressing since previous exam, ID recommends starting doxycycline 10035mo BID on a full stomach following completion of daptomycin (initial C&S reviewed, was pansensitive). -Given acute cholecystitis, have continued pt on rocephin and flagyl  Diarrhea: No indication for CDiff testing. Possibly due to malabsorption/cholecystitis, though this symptom preceded abd pain/N/V.  - Patient is continued on probiotics 2 weeks (final dose 11/16) following conclusion of antibiotics.  PVD s/p bilateral transtibial amputations: Reevaluated by Dr. DudSharol Givenring this hospitalization, staples removed from recent right TTA on 10/15.  - Will plan on prosthetic fitting as outpatient  Anemia of acute and  chronic disease: Stable hgb.  - Got aranesp [place] 10/18. - recheck  CBC in AM  T12-L1 Discitis/osteomyelitis: Loss of cortex along anterior inferior aspect of T12 vertebral body, lucency in posterior aspect of L1 vertebral body. Seems to have progressed since previous exam.  - D/w ID, will prolong antibiotics course with doxycycline as above.  - Will need MRI w/ and w/o contrast for further evaluation closer to conclusion of antibiotics. Not complaining of significant back pain. - Appears stable at this time   Acute hypoxic respiratory failure:  -Due to pulmonary edema. -On minimal O2 support  Iron deficiency anemia and anemia of acute illness:  - Hgb stable at this time 8's. - Iron supplement to be continued  T2DM: A1c 8.5%.  - continue SSI coverage  Thrombocytopenia: Improved  Oral candidiasis: Treated with fluconazole  Obesity: BMI 34.  - Dietitian consulted  DVT prophylaxis: coumadin Code Status: Full Family Communication: Pt in room, family not at bedside Disposition Plan: SNF, timing uncertain  Consultants:   ID  IR  Cardiology  PCCM  Nephrology  General surgery  Procedures:   Temporary HD catheter  HD  Antimicrobials: Anti-infectives (From admission, onward)   Start     Dose/Rate Route Frequency Ordered Stop   02/26/18 2000  cefTRIAXone (ROCEPHIN) 2 g in sodium chloride 0.9 % 100 mL IVPB     2 g 200 mL/hr over 30 Minutes Intravenous Every 24 hours 02/26/18 1911     02/26/18 2000  metroNIDAZOLE (FLAGYL) IVPB 500 mg     500 mg 100 mL/hr over 60 Minutes Intravenous Every 8 hours 02/26/18 1911     02/24/18 0000  daptomycin (CUBICIN) IVPB     700 mg Intravenous Every 24 hours 02/24/18 1141 03/09/18 2359   02/23/18 2000  DAPTOmycin (CUBICIN) 700 mg in sodium chloride 0.9 % IVPB  Status:  Discontinued     700 mg 228 mL/hr over 30 Minutes Intravenous Every 24 hours 02/23/18 1233 02/26/18 1911   02/13/18 1415  ceFAZolin (ANCEF) powder 2 g     2 g  Other To Surgery 02/13/18 1410 02/13/18 2142   02/13/18 1347  ceFAZolin (ANCEF) 2-4 GM/100ML-% IVPB    Note to Pharmacy:  Arlean Hopping   : cabinet override      02/13/18 1347 02/13/18 1410   01/30/18 2000  DAPTOmycin (CUBICIN) 700 mg in sodium chloride 0.9 % IVPB  Status:  Discontinued     700 mg 228 mL/hr over 30 Minutes Intravenous Every 48 hours 01/30/18 1306 02/23/18 1233   01/30/18 1000  fluconazole (DIFLUCAN) tablet 50 mg     50 mg Oral Daily 01/29/18 0930 02/04/18 0914   01/29/18 2000  DAPTOmycin (CUBICIN) 700 mg in sodium chloride 0.9 % IVPB  Status:  Discontinued     700 mg 228 mL/hr over 30 Minutes Intravenous Every 48 hours 01/29/18 1013 01/30/18 1306   01/29/18 1030  fluconazole (DIFLUCAN) tablet 100 mg     100 mg Oral  Once 01/29/18 0930 01/29/18 1121   01/28/18 2200  ceFAZolin (ANCEF) IVPB 1 g/50 mL premix  Status:  Discontinued     1 g 100 mL/hr over 30 Minutes Intravenous Every 24 hours 01/28/18 1007 01/28/18 1231   01/27/18 2245  ceFAZolin (ANCEF) IVPB 2g/100 mL premix  Status:  Discontinued     2 g 200 mL/hr over 30 Minutes Intravenous Every 12 hours 01/27/18 2243 01/28/18 1007   01/27/18 2230  ceFAZolin (ANCEF) IVPB  Status:  Discontinued    Note to Pharmacy:  Indication:  MSSA bacteremia/endocarditis Last Day of Therapy:  03/08/18 Labs - Once weekly:  CBC/D and BMP, Labs - Every other week:  ESR and CRP     2 g Intravenous Every 8 hours 01/27/18 2220 01/27/18 2227   01/27/18 2230  ceFAZolin (ANCEF) IVPB 2g/100 mL premix  Status:  Discontinued     2 g 200 mL/hr over 30 Minutes Intravenous Every 8 hours 01/27/18 2227 01/27/18 2243      Subjective: Still with RUQ abd pain  Objective: Vitals:   02/26/18 1700 02/26/18 2049 02/27/18 0522 02/27/18 0849  BP: (!) 123/58 114/60 112/62 (!) 116/59  Pulse: 86 86 87 89  Resp: '18 18 18 18  ' Temp: 98.4 F (36.9 C) 98 F (36.7 C) 98.2 F (36.8 C) 98.3 F (36.8 C)  TempSrc: Oral Oral Oral Oral  SpO2: 94% 96% 100%  96%  Weight:  106.3 kg    Height:        Intake/Output Summary (Last 24 hours) at 02/27/2018 1453 Last data filed at 02/27/2018 1427 Gross per 24 hour  Intake 929.59 ml  Output 550 ml  Net 379.59 ml   Filed Weights   02/24/18 2223 02/26/18 0000 02/26/18 2049  Weight: 108 kg 106 kg 106.3 kg    Examination: General exam: Awake, laying in bed, in nad Respiratory system: Normal respiratory effort, no wheezing Cardiovascular system: regular rate, s1, s2 Gastrointestinal system: Soft, nondistended, RUQ abd pain Central nervous system: CN2-12 grossly intact, strength intact Extremities: Perfused, no clubbing Skin: Normal skin turgor, no notable skin lesions seen Psychiatry: Mood normal // no visual hallucinations   Data Reviewed: I have personally reviewed following labs and imaging studies  CBC: Recent Labs  Lab 02/21/18 0803 02/23/18 0338 02/26/18 0350 02/26/18 1208  WBC 9.8 9.5 30.3* 29.2*  HGB 8.6* 8.3* 9.1* 8.8*  HCT 28.2* 27.5* 29.5* 29.5*  MCV 91.0 90.8 89.7 90.5  PLT 229 225 236 127   Basic Metabolic Panel: Recent Labs  Lab 02/21/18 0803 02/22/18 0330 02/23/18 0338 02/24/18 0336 02/25/18 0335 02/26/18 0350 02/27/18 0402  NA 136 136 139 140 136 135 134*  K 3.9 3.6 3.5 3.3* 4.2 3.6 3.4*  CL 104 103 106 110 105 104 105  CO2 '23 23 24 24 ' 21* 21* 19*  GLUCOSE 179* 130* 139* 68* 231* 191* 178*  BUN 74* 77* 77* 70* 65* 72* 84*  CREATININE 3.45* 3.18* 2.56* 2.25* 2.45* 2.66* 3.34*  CALCIUM 7.8* 7.6* 7.8* 7.9* 8.1* 8.0* 7.7*  PHOS 5.4* 5.2* 4.7* 4.1 3.8  --   --    GFR: Estimated Creatinine Clearance: 28.4 mL/min (A) (by C-G formula based on SCr of 3.34 mg/dL (H)). Liver Function Tests: Recent Labs  Lab 02/24/18 0336 02/25/18 0335 02/25/18 2055 02/26/18 0350 02/27/18 0402  AST  --   --  21 49* 51*  ALT  --   --  '23 25 27  ' ALKPHOS  --   --  140* 94 106  BILITOT  --   --  1.2 1.2 1.0  PROT  --   --  6.1* 6.2* 5.5*  ALBUMIN 2.2* 2.4* 2.3* 2.2* 1.9*    No results for input(s): LIPASE, AMYLASE in the last 168 hours. No results for input(s): AMMONIA in the last 168 hours. Coagulation Profile: Recent Labs  Lab 02/23/18 0338 02/24/18 0336 02/25/18 0335 02/26/18 0350 02/27/18 0402  INR 2.99 3.08 3.25 3.27 2.57   Cardiac Enzymes: Recent Labs  Lab 02/23/18 0338 02/27/18 0402  CKTOTAL 187 169   BNP (last 3 results) No  results for input(s): PROBNP in the last 8760 hours. HbA1C: No results for input(s): HGBA1C in the last 72 hours. CBG: Recent Labs  Lab 02/26/18 1201 02/26/18 1701 02/26/18 2049 02/27/18 0849 02/27/18 1153  GLUCAP 165* 168* 191* 148* 125*   Lipid Profile: No results for input(s): CHOL, HDL, LDLCALC, TRIG, CHOLHDL, LDLDIRECT in the last 72 hours. Thyroid Function Tests: No results for input(s): TSH, T4TOTAL, FREET4, T3FREE, THYROIDAB in the last 72 hours. Anemia Panel: No results for input(s): VITAMINB12, FOLATE, FERRITIN, TIBC, IRON, RETICCTPCT in the last 72 hours. Sepsis Labs: No results for input(s): PROCALCITON, LATICACIDVEN in the last 168 hours.  Recent Results (from the past 240 hour(s))  Gastrointestinal Panel by PCR , Stool     Status: None   Collection Time: 02/18/18 12:18 PM  Result Value Ref Range Status   Campylobacter species NOT DETECTED NOT DETECTED Final   Plesimonas shigelloides NOT DETECTED NOT DETECTED Final   Salmonella species NOT DETECTED NOT DETECTED Final   Yersinia enterocolitica NOT DETECTED NOT DETECTED Final   Vibrio species NOT DETECTED NOT DETECTED Final   Vibrio cholerae NOT DETECTED NOT DETECTED Final   Enteroaggregative E coli (EAEC) NOT DETECTED NOT DETECTED Final   Enteropathogenic E coli (EPEC) NOT DETECTED NOT DETECTED Final   Enterotoxigenic E coli (ETEC) NOT DETECTED NOT DETECTED Final   Shiga like toxin producing E coli (STEC) NOT DETECTED NOT DETECTED Final   Shigella/Enteroinvasive E coli (EIEC) NOT DETECTED NOT DETECTED Final   Cryptosporidium NOT  DETECTED NOT DETECTED Final   Cyclospora cayetanensis NOT DETECTED NOT DETECTED Final   Entamoeba histolytica NOT DETECTED NOT DETECTED Final   Giardia lamblia NOT DETECTED NOT DETECTED Final   Adenovirus F40/41 NOT DETECTED NOT DETECTED Final   Astrovirus NOT DETECTED NOT DETECTED Final   Norovirus GI/GII NOT DETECTED NOT DETECTED Final   Rotavirus A NOT DETECTED NOT DETECTED Final   Sapovirus (I, II, IV, and V) NOT DETECTED NOT DETECTED Final    Comment: Performed at [facility], [address] [place], [place] [address]     Radiology Studies: Nm Hepatobiliary Liver Func  Result Date: 02/26/2018 CLINICAL DATA:  Abdominal pain. Abnormal gallbladder on a recent CT. EXAM: NUCLEAR MEDICINE HEPATOBILIARY IMAGING TECHNIQUE: Sequential images of the abdomen were obtained out to 60 minutes following intravenous administration of radiopharmaceutical. RADIOPHARMACEUTICALS:  5.15 mCi Tc-43m Choletec IV COMPARISON:  CT, 02/25/2018 FINDINGS: There is prompt radiotracer accumulation by the liver. Prompt excretion is seen into the intra and extrahepatic biliary tree with small bowel activity seen early during the exam. No gallbladder activity was seen after 1 hour of imaging following radiotracer injection. The patient was then injected with 3 mg of morphine to induce sphincter of Odi spasm followed by additional imaging. No gallbladder activity was documented on the follow-up imaging. IMPRESSION: 1. Nonvisualization of the gallbladder consistent with an obstructed cystic duct and acute cholecystitis. Electronically Signed   By: DLajean ManesM.D.   On: 02/26/2018 13:19    Scheduled Meds: . Chlorhexidine Gluconate Cloth  6 each Topical Q0600  . feeding supplement  1 Container Oral TID WC  . insulin aspart  0-5 Units Subcutaneous QHS  . insulin aspart  0-9 Units Subcutaneous TID WC  . insulin aspart  4 Units Subcutaneous TID WC  . insulin glargine  22 Units Subcutaneous QHS  . latanoprost  1  drop Both Eyes QHS  . mouth rinse  15 mL Mouth Rinse BID  . metoprolol tartrate  50 mg Oral BID  . multivitamin  1 tablet Oral QHS  . nystatin   Topical BID  . pantoprazole  40 mg Oral Q1200  . saccharomyces boulardii  250 mg Oral BID  . traZODone  50 mg Oral QHS  . Warfarin - Pharmacist Dosing Inpatient   Does not apply q1800   Continuous Infusions: . sodium chloride 1,000 mL (02/26/18 2029)  . sodium chloride 75 mL/hr at 02/27/18 1412  . cefTRIAXone (ROCEPHIN)  IV 2 g (02/26/18 2209)  . metronidazole 500 mg (02/27/18 1142)     LOS: 31 days   Marylu Lund, MD [facility] Pager On Amion  If 7PM-7AM, please contact night-coverage 02/27/2018, 2:53 PM

## 2018-02-28 ENCOUNTER — Inpatient Hospital Stay (HOSPITAL_COMMUNITY): Payer: Medicare Other | Admitting: Certified Registered"

## 2018-02-28 ENCOUNTER — Encounter (HOSPITAL_COMMUNITY)
Admission: EM | Disposition: A | Discharge status: Skilled Nursing Facility | Payer: Self-pay | Source: Home / Self Care | Attending: Family Medicine

## 2018-02-28 ENCOUNTER — Encounter (HOSPITAL_COMMUNITY): Payer: Self-pay | Admitting: Surgery

## 2018-02-28 HISTORY — PX: CHOLECYSTECTOMY: SHX55

## 2018-02-28 LAB — GLUCOSE, CAPILLARY
GLUCOSE-CAPILLARY: 125 mg/dL — AB (ref 70–99)
GLUCOSE-CAPILLARY: 110 mg/dL — AB (ref 70–99)
GLUCOSE-CAPILLARY: 112 mg/dL — AB (ref 70–99)
GLUCOSE-CAPILLARY: 190 mg/dL — AB (ref 70–99)
Glucose-Capillary: 81 mg/dL (ref 70–99)
Glucose-Capillary: 62 mg/dL — ABNORMAL LOW (ref 70–99)
Glucose-Capillary: 105 mg/dL — ABNORMAL HIGH (ref 70–99)

## 2018-02-28 LAB — URINALYSIS, ROUTINE W REFLEX MICROSCOPIC
Bilirubin Urine: NEGATIVE
GLUCOSE, UA: NEGATIVE mg/dL
KETONES UR: NEGATIVE mg/dL
Nitrite: NEGATIVE
PROTEIN: 100 mg/dL — AB
Specific Gravity, Urine: 1.011 (ref 1.005–1.030)
pH: 5 (ref 5.0–8.0)

## 2018-02-28 LAB — CBC
HCT: 27.6 % — ABNORMAL LOW (ref 39.0–52.0)
HEMOGLOBIN: 8.6 g/dL — AB (ref 13.0–17.0)
MCH: 27.7 pg (ref 26.0–34.0)
MCHC: 31.2 g/dL (ref 30.0–36.0)
MCV: 88.7 fL (ref 80.0–100.0)
Platelets: 230 10*3/uL (ref 150–400)
RBC: 3.11 MIL/uL — ABNORMAL LOW (ref 4.22–5.81)
RDW: 14.6 % (ref 11.5–15.5)
WBC: 17.1 10*3/uL — AB (ref 4.0–10.5)
nRBC: 0 % (ref 0.0–0.2)

## 2018-02-28 LAB — PROTIME-INR
INR: 1.77
PROTHROMBIN TIME: 20.4 s — AB (ref 11.4–15.2)

## 2018-02-28 LAB — RENAL FUNCTION PANEL
ANION GAP: 10 (ref 5–15)
Albumin: 1.9 g/dL — ABNORMAL LOW (ref 3.5–5.0)
BUN: 86 mg/dL — ABNORMAL HIGH (ref 8–23)
CO2: 19 mmol/L — AB (ref 22–32)
Calcium: 7.4 mg/dL — ABNORMAL LOW (ref 8.9–10.3)
Chloride: 105 mmol/L (ref 98–111)
Creatinine, Ser: 3.15 mg/dL — ABNORMAL HIGH (ref 0.61–1.24)
GFR calc Af Amer: 23 mL/min — ABNORMAL LOW (ref >60)
GFR calc non Af Amer: 20 mL/min — ABNORMAL LOW (ref >60)
Glucose, Bld: 80 mg/dL (ref 70–99)
Phosphorus: 4.9 mg/dL — ABNORMAL HIGH (ref 2.5–4.6)
Potassium: 3 mmol/L — ABNORMAL LOW (ref 3.5–5.1)
SODIUM: 134 mmol/L — AB (ref 135–145)

## 2018-02-28 LAB — CREATININE, URINE, RANDOM: Creatinine, Urine: 72.64 mg/dL

## 2018-02-28 LAB — SODIUM, URINE, RANDOM: Sodium, Ur: <10 mmol/L

## 2018-02-28 SURGERY — LAPAROSCOPIC CHOLECYSTECTOMY
Anesthesia: General | Site: Abdomen

## 2018-02-28 MED ORDER — PHENYLEPHRINE 40 MCG/ML (10ML) SYRINGE FOR IV PUSH (FOR BLOOD PRESSURE SUPPORT)
PREFILLED_SYRINGE | INTRAVENOUS | Status: AC
  Filled 2018-02-28: qty 10

## 2018-02-28 MED ORDER — EPHEDRINE SULFATE-NACL 50-0.9 MG/10ML-% IV SOSY
PREFILLED_SYRINGE | INTRAVENOUS | Status: DC | PRN
  Administered 2018-02-28: 5 mg via INTRAVENOUS

## 2018-02-28 MED ORDER — SODIUM CHLORIDE 0.9 % IV SOLN: INTRAVENOUS | Status: DC

## 2018-02-28 MED ORDER — FENTANYL CITRATE (PF) 100 MCG/2ML IJ SOLN: 25.0000 ug | INTRAMUSCULAR | Status: DC | PRN

## 2018-02-28 MED ORDER — LIDOCAINE 2% (20 MG/ML) 5 ML SYRINGE
INTRAMUSCULAR | Status: AC
  Filled 2018-02-28: qty 5

## 2018-02-28 MED ORDER — HEMOSTATIC AGENTS (NO CHARGE) OPTIME
TOPICAL | Status: DC | PRN
  Administered 2018-02-28: 1 via TOPICAL

## 2018-02-28 MED ORDER — PROPOFOL 10 MG/ML IV BOLUS
INTRAVENOUS | Status: DC | PRN
  Administered 2018-02-28: 40 mg via INTRAVENOUS

## 2018-02-28 MED ORDER — DEXAMETHASONE SODIUM PHOSPHATE 10 MG/ML IJ SOLN
INTRAMUSCULAR | Status: DC | PRN
  Administered 2018-02-28: 8 mg via INTRAVENOUS

## 2018-02-28 MED ORDER — DEXTROSE 50 % IV SOLN
INTRAVENOUS | Status: AC
  Administered 2018-02-28: 50 mL
  Filled 2018-02-28: qty 50

## 2018-02-28 MED ORDER — ACETAMINOPHEN 10 MG/ML IV SOLN
1000.0000 mg | Freq: Once | INTRAVENOUS | Status: DC | PRN
  Administered 2018-02-28: 1000 mg via INTRAVENOUS

## 2018-02-28 MED ORDER — 0.9 % SODIUM CHLORIDE (POUR BTL) OPTIME
TOPICAL | Status: DC | PRN
  Administered 2018-02-28: 1000 mL

## 2018-02-28 MED ORDER — MIDAZOLAM HCL 5 MG/5ML IJ SOLN
INTRAMUSCULAR | Status: DC | PRN
  Administered 2018-02-28: 2 mg via INTRAVENOUS

## 2018-02-28 MED ORDER — OXYCODONE HCL 5 MG PO TABS: 5.0000 mg | ORAL_TABLET | Freq: Once | ORAL | Status: DC | PRN

## 2018-02-28 MED ORDER — STERILE WATER FOR IRRIGATION IR SOLN
Status: DC | PRN
  Administered 2018-02-28: 1000 mL

## 2018-02-28 MED ORDER — PIPERACILLIN-TAZOBACTAM 3.375 G IVPB
3.3750 g | Freq: Two times a day (BID) | INTRAVENOUS | Status: DC
  Administered 2018-02-28 – 2018-03-03 (×7): 3.375 g via INTRAVENOUS
  Filled 2018-02-28 (×7): qty 50

## 2018-02-28 MED ORDER — ACETAMINOPHEN 10 MG/ML IV SOLN
INTRAVENOUS | Status: AC
  Filled 2018-02-28: qty 100

## 2018-02-28 MED ORDER — POTASSIUM CHLORIDE CRYS ER 20 MEQ PO TBCR
40.0000 meq | EXTENDED_RELEASE_TABLET | Freq: Once | ORAL | Status: AC
  Administered 2018-02-28: 40 meq via ORAL
  Filled 2018-02-28: qty 2

## 2018-02-28 MED ORDER — ACETAMINOPHEN 500 MG PO TABS: 1000.0000 mg | ORAL_TABLET | Freq: Once | ORAL | Status: DC | PRN

## 2018-02-28 MED ORDER — ROCURONIUM BROMIDE 50 MG/5ML IV SOSY
PREFILLED_SYRINGE | INTRAVENOUS | Status: AC
  Filled 2018-02-28: qty 5

## 2018-02-28 MED ORDER — FENTANYL CITRATE (PF) 250 MCG/5ML IJ SOLN
INTRAMUSCULAR | Status: AC
  Filled 2018-02-28: qty 5

## 2018-02-28 MED ORDER — DEXAMETHASONE SODIUM PHOSPHATE 10 MG/ML IJ SOLN
INTRAMUSCULAR | Status: AC
  Filled 2018-02-28: qty 1

## 2018-02-28 MED ORDER — TRAMADOL HCL 50 MG PO TABS
50.0000 mg | ORAL_TABLET | Freq: Four times a day (QID) | ORAL | Status: DC | PRN
  Administered 2018-03-02: 50 mg via ORAL
  Filled 2018-02-28: qty 1

## 2018-02-28 MED ORDER — OXYCODONE HCL 5 MG/5ML PO SOLN: 5.0000 mg | Freq: Once | ORAL | Status: DC | PRN

## 2018-02-28 MED ORDER — ONDANSETRON HCL 4 MG/2ML IJ SOLN
INTRAMUSCULAR | Status: DC | PRN
  Administered 2018-02-28: 4 mg via INTRAVENOUS

## 2018-02-28 MED ORDER — SODIUM CHLORIDE 0.9 % IV SOLN: INTRAVENOUS | Status: DC | PRN

## 2018-02-28 MED ORDER — ACETAMINOPHEN 160 MG/5ML PO SOLN: 1000.0000 mg | Freq: Once | ORAL | Status: DC | PRN

## 2018-02-28 MED ORDER — LIDOCAINE 2% (20 MG/ML) 5 ML SYRINGE
INTRAMUSCULAR | Status: DC | PRN
  Administered 2018-02-28: 60 mg via INTRAVENOUS

## 2018-02-28 MED ORDER — ALBUMIN HUMAN 5 % IV SOLN
INTRAVENOUS | Status: DC | PRN
  Administered 2018-02-28: 12:00:00 via INTRAVENOUS

## 2018-02-28 MED ORDER — ONDANSETRON HCL 4 MG/2ML IJ SOLN
INTRAMUSCULAR | Status: AC
  Filled 2018-02-28: qty 2

## 2018-02-28 MED ORDER — PROPOFOL 10 MG/ML IV BOLUS
INTRAVENOUS | Status: AC
  Filled 2018-02-28: qty 20

## 2018-02-28 MED ORDER — PHENYLEPHRINE HCL 10 MG/ML IJ SOLN
INTRAMUSCULAR | Status: DC | PRN
  Administered 2018-02-28: 120 ug via INTRAVENOUS
  Administered 2018-02-28 (×2): 80 ug via INTRAVENOUS
  Administered 2018-02-28: 40 ug via INTRAVENOUS
  Administered 2018-02-28: 80 ug via INTRAVENOUS

## 2018-02-28 MED ORDER — FENTANYL CITRATE (PF) 100 MCG/2ML IJ SOLN
INTRAMUSCULAR | Status: DC | PRN
  Administered 2018-02-28 (×5): 50 ug via INTRAVENOUS

## 2018-02-28 MED ORDER — SODIUM CHLORIDE 0.9 % IR SOLN
Status: DC | PRN
  Administered 2018-02-28: 1000 mL

## 2018-02-28 MED ORDER — MIDAZOLAM HCL 2 MG/2ML IJ SOLN
INTRAMUSCULAR | Status: AC
  Filled 2018-02-28: qty 2

## 2018-02-28 MED ORDER — BUPIVACAINE-EPINEPHRINE 0.25% -1:200000 IJ SOLN
INTRAMUSCULAR | Status: DC | PRN
  Administered 2018-02-28: 20 mL

## 2018-02-28 MED ORDER — SUGAMMADEX SODIUM 200 MG/2ML IV SOLN
INTRAVENOUS | Status: DC | PRN
  Administered 2018-02-28: 200 mg via INTRAVENOUS

## 2018-02-28 MED ORDER — SODIUM CHLORIDE 0.9 % IV SOLN
INTRAVENOUS | Status: DC | PRN
  Administered 2018-02-28: 40 ug/min via INTRAVENOUS

## 2018-02-28 MED ORDER — ROCURONIUM BROMIDE 50 MG/5ML IV SOSY
PREFILLED_SYRINGE | INTRAVENOUS | Status: DC | PRN
  Administered 2018-02-28: 40 mg via INTRAVENOUS

## 2018-02-28 SURGICAL SUPPLY — 42 items
ADH SKN CLS APL DERMABOND .7 (GAUZE/BANDAGES/DRESSINGS) ×1
APPLIER CLIP 5 13 M/L LIGAMAX5 (MISCELLANEOUS) ×3
APR CLP MED LRG 5 ANG JAW (MISCELLANEOUS) ×1
BAG SPEC RTRVL LRG 6X4 10 (ENDOMECHANICALS) ×1
BIOPATCH RED 1 DISK 7.0 (GAUZE/BANDAGES/DRESSINGS) ×1 IMPLANT
BIOPATCH RED 1IN DISK 7.0MM (GAUZE/BANDAGES/DRESSINGS) ×1
CANISTER SUCT 3000ML PPV (MISCELLANEOUS) ×3 IMPLANT
CHLORAPREP W/TINT 26ML (MISCELLANEOUS) ×3 IMPLANT
CLIP APPLIE 5 13 M/L LIGAMAX5 (MISCELLANEOUS) ×1 IMPLANT
COVER SURGICAL LIGHT HANDLE (MISCELLANEOUS) ×3 IMPLANT
COVER WAND RF STERILE (DRAPES) ×1 IMPLANT
DERMABOND ADVANCED (GAUZE/BANDAGES/DRESSINGS) ×2
DERMABOND ADVANCED .7 DNX12 (GAUZE/BANDAGES/DRESSINGS) ×1 IMPLANT
DRAIN CHANNEL 19F RND (DRAIN) ×2 IMPLANT
DRSG TEGADERM 2-3/8X2-3/4 SM (GAUZE/BANDAGES/DRESSINGS) ×2 IMPLANT
ELECT REM PT RETURN 9FT ADLT (ELECTROSURGICAL) ×3
ELECTRODE REM PT RTRN 9FT ADLT (ELECTROSURGICAL) ×1 IMPLANT
EVACUATOR SILICONE 100CC (DRAIN) ×2 IMPLANT
GLOVE SURG SIGNA 7.5 PF LTX (GLOVE) ×3 IMPLANT
GOWN STRL REUS W/ TWL LRG LVL3 (GOWN DISPOSABLE) ×2 IMPLANT
GOWN STRL REUS W/ TWL XL LVL3 (GOWN DISPOSABLE) ×1 IMPLANT
GOWN STRL REUS W/TWL LRG LVL3 (GOWN DISPOSABLE) ×6
GOWN STRL REUS W/TWL XL LVL3 (GOWN DISPOSABLE) ×3
HEMOSTAT SNOW SURGICEL 2X4 (HEMOSTASIS) ×2 IMPLANT
KIT BASIN OR (CUSTOM PROCEDURE TRAY) ×3 IMPLANT
KIT TURNOVER KIT B (KITS) ×3 IMPLANT
NS IRRIG 1000ML POUR BTL (IV SOLUTION) ×3 IMPLANT
PAD ARMBOARD 7.5X6 YLW CONV (MISCELLANEOUS) ×3 IMPLANT
POUCH SPECIMEN RETRIEVAL 10MM (ENDOMECHANICALS) ×3 IMPLANT
SCISSORS LAP 5X35 DISP (ENDOMECHANICALS) ×3 IMPLANT
SET IRRIG TUBING LAPAROSCOPIC (IRRIGATION / IRRIGATOR) ×3 IMPLANT
SLEEVE ENDOPATH XCEL 5M (ENDOMECHANICALS) ×6 IMPLANT
SPECIMEN JAR SMALL (MISCELLANEOUS) ×3 IMPLANT
SUT ETHILON 2 0 FS 18 (SUTURE) ×2 IMPLANT
SUT MNCRL AB 4-0 PS2 18 (SUTURE) ×3 IMPLANT
TOWEL OR 17X24 6PK STRL BLUE (TOWEL DISPOSABLE) ×3 IMPLANT
TOWEL OR 17X26 10 PK STRL BLUE (TOWEL DISPOSABLE) ×1 IMPLANT
TRAY LAPAROSCOPIC MC (CUSTOM PROCEDURE TRAY) ×3 IMPLANT
TROCAR XCEL BLUNT TIP 100MML (ENDOMECHANICALS) ×3 IMPLANT
TROCAR XCEL NON-BLD 5MMX100MML (ENDOMECHANICALS) ×3 IMPLANT
TUBING INSUFFLATION (TUBING) ×3 IMPLANT
WATER STERILE IRR 1000ML POUR (IV SOLUTION) ×3 IMPLANT

## 2018-02-28 NOTE — Transfer of Care (Signed)
Immediate Anesthesia Transfer of Care Note  Patient: [name]  Procedure(s) Performed: LAPAROSCOPIC CHOLECYSTECTOMY (N/A Abdomen)  Patient Location: PACU  Anesthesia Type:General  Level of Consciousness: awake, oriented and patient cooperative  Airway & Oxygen Therapy: Patient Spontanous Breathing and Patient connected to face mask oxygen  Post-op Assessment: Report given to RN and Post -op Vital signs reviewed and stable  Post vital signs: Reviewed and stable  Last Vitals:  Vitals Value Taken Time  BP 136/74 02/28/2018  1:12 PM  Temp    Pulse 62 02/28/2018  1:15 PM  Resp 19 02/28/2018  1:15 PM  SpO2 99 % 02/28/2018  1:15 PM  Vitals shown include unvalidated device data.  Last Pain:  Vitals:   02/28/18 0932  TempSrc: Oral  PainSc:       Patients Stated Pain Goal: 0 (81/15/72 6203)  Complications: No apparent anesthesia complications

## 2018-02-28 NOTE — Clinical Social Work Note (Signed)
CSW continuing to monitor patient's progress and progress notes reviewed. Patient not medically stable for discharge at this time. Attempted to reach admissions director at [facility] and message left. CSW will continue to follow, provide SW intervention services as needed/requested and facilitate discharge to [facility] when medically stable.  Loda Bialas Givens, MSW, LCSW Licensed Clinical Social Worker [place] [phone]

## 2018-02-28 NOTE — Anesthesia Preprocedure Evaluation (Addendum)
Anesthesia Evaluation  Patient identified by MRN, date of birth, ID band Patient awake    Reviewed: Allergy & Precautions, NPO status , Patient's Chart, lab work & pertinent test results  History of Anesthesia Complications Negative for: history of anesthetic complications  Airway Mallampati: III  TM Distance: >3 FB Neck ROM: Full    Dental  (+) Missing, Poor Dentition, Dental Advisory Given   Pulmonary neg shortness of breath, neg COPD, neg recent URI, former smoker,    breath sounds clear to auscultation       Cardiovascular hypertension, Pt. on home beta blockers and Pt. 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 The estimated   ejection fraction was in the range of 50% to 55%. Wall motion was   normal; there were no regional wall motion abnormalities. - Aortic valve: A prosthesis was present and functioning normally.   The prosthesis had a normal range of motion. The sewing ring   appeared normal, had no rocking motion, and showed no evidence of   dehiscence. - Mitral valve: Mildly calcified annulus. - Left atrium: No evidence of thrombus in the atrial cavity or   appendage. - Right atrium: No evidence of thrombus in the atrial cavity or   appendage. - Atrial septum: There was redundancy of the septum, with   borderline criteria for aneurysm. - Tricuspid valve: There was a vegetation.  Reproductive/Obstetrics                             Anesthesia Physical Anesthesia Plan  ASA: IV  Anesthesia Plan: General   Post-op Pain Management:    Induction: Intravenous  PONV Risk Score and Plan: 2 and Ondansetron and Dexamethasone  Airway Management Planned: Oral ETT  Additional Equipment:   Intra-op Plan:   Post-operative Plan: Extubation in OR  Informed Consent: I have reviewed the patients History and Physical, chart, labs and discussed the procedure including the risks, benefits and alternatives for the proposed anesthesia with the patient or authorized representative who has indicated his/her understanding and acceptance.   Dental advisory given  Plan Discussed with: CRNA and Surgeon  Anesthesia Plan Comments:         Anesthesia Quick Evaluation

## 2018-02-28 NOTE — Anesthesia Procedure Notes (Signed)
Procedure Name: Intubation Date/Time: 02/28/2018 12:02 PM Performed by: Orlie Dakin, CRNA Pre-anesthesia Checklist: Patient identified, Emergency Drugs available, Suction available and Patient being monitored Patient Re-evaluated:Patient Re-evaluated prior to induction Oxygen Delivery Method: Circle system utilized Preoxygenation: Pre-oxygenation with 100% oxygen Induction Type: IV induction Ventilation: Mask ventilation without difficulty Laryngoscope Size: Miller and 3 Grade View: Grade I Tube type: Oral Tube size: 7.5 mm Number of attempts: 1 Airway Equipment and Method: Stylet Placement Confirmation: ETT inserted through vocal cords under direct vision,  positive ETCO2 and breath sounds checked- equal and bilateral Secured at: 23 cm Tube secured with: Tape Dental Injury: Teeth and Oropharynx as per pre-operative assessment  Comments: Very poor dentition pre-op.  None loose per patient report.

## 2018-02-28 NOTE — Progress Notes (Signed)
ANTICOAGULATION CONSULT NOTE - Follow Up Consult  Pharmacy Consult for Coumadin  Indication: atrial fibrillation   Allergies  Allergen Reactions  . Ace Inhibitors Cough  . Cefazolin     Possible cause of AKI, AIN    Patient Measurements: Height: 5\' 10"  (177.8 cm) Weight: 106 lb (48.1 kg) IBW/kg (Calculated) : 73  Vital Signs: Temp: 97.6 F (36.4 C) (10/25 1422) Temp Source: Oral (10/25 1422) BP: 109/58 (10/25 1422) Pulse Rate: 83 (10/25 1422)  Labs: Recent Labs    02/26/18 0350 02/26/18 1208 02/27/18 0402 02/28/18 0350  HGB 9.1* 8.8*  --  8.6*  HCT 29.5* 29.5*  --  27.6*  PLT 236 237  --  230  LABPROT 32.8*  --  27.3* 20.4*  INR 3.27  --  2.57 1.77  CREATININE 2.66*  --  3.34* 3.15*  CKTOTAL  --   --  169  --     Estimated Creatinine Clearance: 16.8 mL/min (A) (by C-G formula based on SCr of 3.15 mg/dL (H)).  Assessment:  62 yo M with new onset PAF on Coumadin. Coumadin started on 10/1, achieved therapeutic INR, then held for [facility] placement. Received Vitamin K 1 mg on 10/7 and 10/8.   Coumadin resumed on 02/13/18 after [facility] placement - then held and reversed yet again on 10/23 for plans for lap chole once INR<2.   INR today is 1.77 and within range of <2 for planned lap chole - no completed. Post-op surgery did not leave recommendations for anticoagulation and per discussion with Dr. Wyline Copas to continue to hold dose this evening, to consider resuming on 10/26.  Goal of Therapy:  INR 2-3 Monitor platelets by anticoagulation protocol: Yes   Plan:  - Hold Warfarin - MD to address possible restart on 10/26 - F/u INR in the AM  Thank you for allowing pharmacy to be a part of this patient's care.  Alycia Rossetti, PharmD, BCPS Clinical Pharmacist Pager: [phone] Clinical phone for 02/28/2018 from 7a-3:30p: [phone] If after 3:30p, please call main pharmacy at: [phone] Please check AMION for all [place] numbers 02/28/2018 2:44 PM

## 2018-02-28 NOTE — Progress Notes (Signed)
Patient ID: [name], male   DOB: 1956-04-23, 63 y.o.   MRN: [id number]  Pre Procedure note for inpatients:   [name] has been scheduled for Procedure(s): LAPAROSCOPIC CHOLECYSTECTOMY (N/A) today. The various methods of treatment have been discussed with the patient. After consideration of the risks, benefits and treatment options the patient has consented to the planned procedure.   The patient has been seen and labs reviewed. There are no changes in the patient's condition to prevent proceeding with the planned procedure today.  Recent labs:    Harl Bowie, MD 02/28/2018 9:50 AM

## 2018-02-28 NOTE — Progress Notes (Signed)
PROGRESS NOTE    [name]  ZTI:[id number] DOB: Nov 10, 1955 DOA: 01/27/2018 PCP: [name], MD    Brief Narrative:  62 y.o. male with a history of sepsis due to right foot osteomyelitis s/p transtibial amputation and found to have MSSA bacteremia with splenic infarct and tricuspid valve endocarditis. He was discharged on ancef to [facility] but was quickly brought back in due to oliguria and found to have acute renal failure. He developed acute respiratory failure and transferred to ICU on CCM service. Antibiotics changed to daptomycin per ID and nephrology oversaw hemodialysis through right IJ temporary catheter on 10/12 and 10/14. With improvement in respiratory status he was transferred to the medical floor. Nephrology following closely for signs of renal recovery. Creatinine trending downward as of 10/17 with increasing urine output. Temporary HD catheter removed 10/21. On 10/22 the patient developed acute abdominal pain worse on the right with nausea, vomiting, and inability to take po. CT abd/pelvis demonstrated distended gallbladder with gallstones concerning for cholecystitis. Also showed anasarca, ascites, and pleural effusions, progressive T12-L1 changes noticed on previous study, and stable splenic lesions.   Assessment & Plan:   Principal Problem:   Acute renal failure (ARF) (HCC) Active Problems:   Insulin-requiring or dependent type II diabetes mellitus ([place])   Essential hypertension   CAD (coronary artery disease)   Leukocytosis   Thrombocytopenia (HCC)   S/P BKA (below knee amputation) unilateral, right (HCC)   Endocarditis of tricuspid valve   Hyponatremia   PAF (paroxysmal atrial fibrillation) (HCC)   Aortic atherosclerosis (HCC)   Acute hypoxemic respiratory failure (HCC)  Oliguric acute renal failure: Due to ATN vs. ancef-induced AIN. Continued slow improvement until 10/22. CrCl 60m/min and UOP satisfactory. Pulled temporary catheter 10/21. Renal  function is worsening. Nephrology following. Mucus membranes   Acute abdominal pain, nausea, vomiting secondary to gangrenous cholecystitis: CT abd/pelvis with cholelithiasis and consistent changes with cholecystitis, though with diffuse edema/anasarca, could be false positive.  - HIDA positive for acute cholecystitis -General surgery following and pt is now s/p cholecystectomy -Discussed with ID. Recommendation to continue on zosyn x 7 days, then transition to doxy afterwards  Paroxysmal atrial fibrillation: New diagnosis during admission. Maintaining sinus rhythm. Cardiology consulted, signed off 10/3. - Continue coumadin dosed per pharmacy. - Continue metoprolol - Follow up with Dr. [name] 3-4 weeks post discharge.  - remains rate controlled  MSSA bacteremia and tricuspid valve endocarditis and splenic infarcts: Stopped ancef.  - Initial recommendations to complete daptomycin to complete 6 weeks Tx (through 11/2). - CK transiently mildly elevated but since resolved. Recheck CK weekly. - Due to continued T12-L1 lesion, seen on CT 10/22 progressing since previous exam, ID recommends starting doxycycline 1043mpo BID on a full stomach following completion of daptomycin (initial C&S reviewed, was pansensitive). -discussed with ID with recommendation for transition to zosyn x 7 days the transition to doxy afterwrds  Diarrhea: No indication for CDiff testing. Possibly due to malabsorption/cholecystitis, though this symptom preceded abd pain/N/V.  - Patient is continued on probiotics 2 weeks (final dose 11/16) following conclusion of antibiotics.  PVD s/p bilateral transtibial amputations: Reevaluated by Dr. [name] this hospitalization, staples removed from recent right TTA on 10/15.  - Anticipate prosthetic fitting as outpatient  Anemia of acute and chronic disease: Stable hgb.  - Got aranesp [place] 10/18. - recheck CBC in AM  T12-L1 Discitis/osteomyelitis: Loss of cortex along  anterior inferior aspect of T12 vertebral body, lucency in posterior aspect of L1 vertebral  body. Seems to have progressed since previous exam.  - D/w ID, will prolong antibiotics course with doxycycline as above.  - Will need MRI w/ and w/o contrast for further evaluation closer to conclusion of antibiotics. Not complaining of significant back pain. - Appears stable  Acute hypoxic respiratory failure:  -Due to pulmonary edema. -Remains on minimal O2 support  Iron deficiency anemia and anemia of acute illness:  - Hgb stable at this time 8's. - Continue iron supplementation  T2DM: A1c 8.5%.  - continue SSI coverage  Thrombocytopenia: Improved  Oral candidiasis: Treated with fluconazole  Obesity: BMI 34.  - Dietitian was consulted  DVT prophylaxis: coumadin Code Status: Full Family Communication: Pt in room, family not at bedside Disposition Plan: SNF, timing uncertain  Consultants:   ID  IR  Cardiology  PCCM  Nephrology  General surgery  Procedures:   Temporary HD catheter  HD  Cholecystectomy 10/25  Antimicrobials: Anti-infectives (From admission, onward)   Start     Dose/Rate Route Frequency Ordered Stop   02/26/18 2000  cefTRIAXone (ROCEPHIN) 2 g in sodium chloride 0.9 % 100 mL IVPB  Status:  Discontinued     2 g 200 mL/hr over 30 Minutes Intravenous Every 24 hours 02/26/18 1911 02/28/18 1500   02/26/18 2000  metroNIDAZOLE (FLAGYL) IVPB 500 mg  Status:  Discontinued     500 mg 100 mL/hr over 60 Minutes Intravenous Every 8 hours 02/26/18 1911 02/28/18 1500   02/24/18 0000  daptomycin (CUBICIN) IVPB     700 mg Intravenous Every 24 hours 02/24/18 1141 03/09/18 2359   02/23/18 2000  DAPTOmycin (CUBICIN) 700 mg in sodium chloride 0.9 % IVPB  Status:  Discontinued     700 mg 228 mL/hr over 30 Minutes Intravenous Every 24 hours 02/23/18 1233 02/26/18 1911   02/13/18 1415  ceFAZolin (ANCEF) powder 2 g     2 g Other To Surgery 02/13/18 1410 02/13/18  2142   02/13/18 1347  ceFAZolin (ANCEF) 2-4 GM/100ML-% IVPB    Note to Pharmacy:  Arlean Hopping   : cabinet override      02/13/18 1347 02/13/18 1410   01/30/18 2000  DAPTOmycin (CUBICIN) 700 mg in sodium chloride 0.9 % IVPB  Status:  Discontinued     700 mg 228 mL/hr over 30 Minutes Intravenous Every 48 hours 01/30/18 1306 02/23/18 1233   01/30/18 1000  fluconazole (DIFLUCAN) tablet 50 mg     50 mg Oral Daily 01/29/18 0930 02/04/18 0914   01/29/18 2000  DAPTOmycin (CUBICIN) 700 mg in sodium chloride 0.9 % IVPB  Status:  Discontinued     700 mg 228 mL/hr over 30 Minutes Intravenous Every 48 hours 01/29/18 1013 01/30/18 1306   01/29/18 1030  fluconazole (DIFLUCAN) tablet 100 mg     100 mg Oral  Once 01/29/18 0930 01/29/18 1121   01/28/18 2200  ceFAZolin (ANCEF) IVPB 1 g/50 mL premix  Status:  Discontinued     1 g 100 mL/hr over 30 Minutes Intravenous Every 24 hours 01/28/18 1007 01/28/18 1231   01/27/18 2245  ceFAZolin (ANCEF) IVPB 2g/100 mL premix  Status:  Discontinued     2 g 200 mL/hr over 30 Minutes Intravenous Every 12 hours 01/27/18 2243 01/28/18 1007   01/27/18 2230  ceFAZolin (ANCEF) IVPB  Status:  Discontinued    Note to Pharmacy:  Indication:  MSSA bacteremia/endocarditis Last Day of Therapy:  03/08/18 Labs - Once weekly:  CBC/D and BMP, Labs - Every other week:  ESR  and CRP     2 g Intravenous Every 8 hours 01/27/18 2220 01/27/18 2227   01/27/18 2230  ceFAZolin (ANCEF) IVPB 2g/100 mL premix  Status:  Discontinued     2 g 200 mL/hr over 30 Minutes Intravenous Every 8 hours 01/27/18 2227 01/27/18 2243      Subjective: Eager to have surgery  Objective: Vitals:   02/28/18 1342 02/28/18 1357 02/28/18 1404 02/28/18 1422  BP: (!) 110/56 (!) 106/56  (!) 109/58  Pulse: 88 90  83  Resp: (!) '9 12  20  ' Temp:   (!) 97.2 F (36.2 C) 97.6 F (36.4 C)  TempSrc:    Oral  SpO2: 92% 93%  95%  Weight:      Height:        Intake/Output Summary (Last 24 hours) at 02/28/2018  1508 Last data filed at 02/28/2018 1459 Gross per 24 hour  Intake 2626.37 ml  Output 805 ml  Net 1821.37 ml   Filed Weights   02/26/18 0000 02/26/18 2049 02/28/18 0539  Weight: 106 kg 106.3 kg 48.1 kg    Examination: General exam: Conversant, in no acute distress Respiratory system: normal chest rise, clear, no audible wheezing Cardiovascular system: regular rhythm, s1-s2 Gastrointestinal system: Nondistended, nontender, pos BS Central nervous system: No seizures, no tremors Extremities: No cyanosis, no joint deformities Skin: No rashes, no pallor Psychiatry: Affect normal // no auditory hallucinations   Data Reviewed: I have personally reviewed following labs and imaging studies  CBC: Recent Labs  Lab 02/23/18 0338 02/26/18 0350 02/26/18 1208 02/28/18 0350  WBC 9.5 30.3* 29.2* 17.1*  HGB 8.3* 9.1* 8.8* 8.6*  HCT 27.5* 29.5* 29.5* 27.6*  MCV 90.8 89.7 90.5 88.7  PLT 225 236 237 751   Basic Metabolic Panel: Recent Labs  Lab 02/22/18 0330 02/23/18 0338 02/24/18 0336 02/25/18 0335 02/26/18 0350 02/27/18 0402 02/28/18 0350  NA 136 139 140 136 135 134* 134*  K 3.6 3.5 3.3* 4.2 3.6 3.4* 3.0*  CL 103 106 110 105 104 105 105  CO2 '23 24 24 ' 21* 21* 19* 19*  GLUCOSE 130* 139* 68* 231* 191* 178* 80  BUN 77* 77* 70* 65* 72* 84* 86*  CREATININE 3.18* 2.56* 2.25* 2.45* 2.66* 3.34* 3.15*  CALCIUM 7.6* 7.8* 7.9* 8.1* 8.0* 7.7* 7.4*  PHOS 5.2* 4.7* 4.1 3.8  --   --  4.9*   GFR: Estimated Creatinine Clearance: 16.8 mL/min (A) (by C-G formula based on SCr of 3.15 mg/dL (H)). Liver Function Tests: Recent Labs  Lab 02/25/18 0335 02/25/18 2055 02/26/18 0350 02/27/18 0402 02/28/18 0350  AST  --  21 49* 51*  --   ALT  --  '23 25 27  ' --   ALKPHOS  --  140* 94 106  --   BILITOT  --  1.2 1.2 1.0  --   PROT  --  6.1* 6.2* 5.5*  --   ALBUMIN 2.4* 2.3* 2.2* 1.9* 1.9*   No results for input(s): LIPASE, AMYLASE in the last 168 hours. No results for input(s): AMMONIA in the  last 168 hours. Coagulation Profile: Recent Labs  Lab 02/24/18 0336 02/25/18 0335 02/26/18 0350 02/27/18 0402 02/28/18 0350  INR 3.08 3.25 3.27 2.57 1.77   Cardiac Enzymes: Recent Labs  Lab 02/23/18 0338 02/27/18 0402  CKTOTAL 187 169   BNP (last 3 results) No results for input(s): PROBNP in the last 8760 hours. HbA1C: No results for input(s): HGBA1C in the last 72 hours. CBG: Recent Labs  Lab  02/28/18 0742 02/28/18 1021 02/28/18 1106 02/28/18 1318 02/28/18 1424  GLUCAP 81 62* 125* 110* 112*   Lipid Profile: No results for input(s): CHOL, HDL, LDLCALC, TRIG, CHOLHDL, LDLDIRECT in the last 72 hours. Thyroid Function Tests: No results for input(s): TSH, T4TOTAL, FREET4, T3FREE, THYROIDAB in the last 72 hours. Anemia Panel: No results for input(s): VITAMINB12, FOLATE, FERRITIN, TIBC, IRON, RETICCTPCT in the last 72 hours. Sepsis Labs: No results for input(s): PROCALCITON, LATICACIDVEN in the last 168 hours.  No results found for this or any previous visit (from the past 240 hour(s)).   Radiology Studies: No results found.  Scheduled Meds: . Chlorhexidine Gluconate Cloth  6 each Topical Q0600  . feeding supplement  1 Container Oral TID WC  . insulin aspart  0-5 Units Subcutaneous QHS  . insulin aspart  0-9 Units Subcutaneous TID WC  . insulin aspart  4 Units Subcutaneous TID WC  . insulin glargine  22 Units Subcutaneous QHS  . latanoprost  1 drop Both Eyes QHS  . mouth rinse  15 mL Mouth Rinse BID  . metoprolol tartrate  50 mg Oral BID  . multivitamin  1 tablet Oral QHS  . nystatin   Topical BID  . pantoprazole  40 mg Oral Q1200  . saccharomyces boulardii  250 mg Oral BID  . traZODone  50 mg Oral QHS  . Warfarin - Pharmacist Dosing Inpatient   Does not apply q1800   Continuous Infusions: . sodium chloride 0 mL/hr at 02/28/18 1202  . sodium chloride 75 mL/hr at 02/28/18 1454  . sodium chloride    . acetaminophen       LOS: 32 days   Marylu Lund,  MD [facility] Pager On Amion  If 7PM-7AM, please contact night-coverage 02/28/2018, 3:08 PM

## 2018-02-28 NOTE — Op Note (Signed)
LAPAROSCOPIC CHOLECYSTECTOMY  Procedure Note  Tracer Gutridge Cominsky 01/27/2018 - 02/28/2018   Pre-op Diagnosis: acute cholecystitis     Post-op Diagnosis: gangrenous cholecystitis  Procedure(s): LAPAROSCOPIC CHOLECYSTECTOMY  Surgeon(s): Coralie Keens, MD  Anesthesia: Choice  Staff:  Circulator: Rosanne Sack, RN; Arlan Organ, RN Relief Circulator: Rozell Searing, RN Relief Scrub: Paulette Blanch, RN Scrub Person: Rozell Searing, RN; Kilkenny, Maida Sale, RN; Dollene Cleveland T  Estimated Blood Loss: less than 50 mL               Specimens: sent to path  Findings: The patient was found to have gangrenous cholecystitis with a necrotic gallbladder.  Even the cystic duct was necrotic.  Procedure: The patient was brought to the operating room and identified as correct patient.  He was placed supine on the operating table and general anesthesia was induced.  His abdomen was then prepped and draped in the usual sterile fashion.  I made a vertical incision above the umbilicus with a scalpel.  I took this down to the fascia which was then opened the scalpel.  Hemostat was then used to pass into the peritoneal cavity under direct vision.  A 0 Vicryl pursestring suture was then placed around the fascial opening.  The [facility] port was placed to the opening insufflation of the abdomen was again carried.  A 5 mm trochars placed the patient's epigastrium under direct vision.  2 more 5 mm trochars were placed in the right upper quadrant under direct vision.  The gallbladder was identified and found to be gangrenous.  It was also fairly necrotic.  As soon as the gallbladder was grasped and started to fall apart leaking bile.  It was not able to be elevated both of her bed.  The cystic duct and cystic artery as well as an anterior branch were identified.  The cystic duct was necrotic and gangrenous in appearance.  I did clip it with several surgical clips and transected as well as the cystic  artery and its posterior branch.  The gallbladder was then slowly dissected free from liver bed with electrocautery.  Again started falling to pieces because of this necrosis.  I was able to completely remove the rest of the gallbladder.  The gallbladder was then placed in an Endosac and removed through the incision at the umbilicus.  I achieved hemostasis in the liver bed with the cautery as well as a piece of surgical snow.  Next, we placed a 80 [place] Blake drain through the most lateral trocar site.  The drain was placed into the liver bed and then sutured in place with a nylon suture.  We thoroughly irrigated the right upper quadrant with a liter of normal saline.  Again hemostasis appeared to be achieved.  All ports inserted the gallbladder is placed in an Endosac and removed through the incision at the umbilicus.  Again hemostasis appeared to be achieved.  All ports were then removed under direct vision and the abdomen was deflated.  0 Vicryl the umbilicus was tied in place closing the fascial defect.  All incisions were then anesthetized with Marcaine and closed with 4-0 Monocryl sutures.  Dermabond was then applied.  The patient tolerated procedure well.  All the counts were correct at the end of the procedure.  The patient was then extubated in the operating room and taken in a stable condition to the recovery room.          Audrie Kuri A   Date:  02/28/2018  Time: 12:57 PM

## 2018-02-28 NOTE — Progress Notes (Signed)
Subjective:  UOP appearing to increase- kidney function numbers stable from yesterday- now s/p lap chole- found gangrenous chole - mild pain post op   Objective Vital signs in last 24 hours: Vitals:   02/28/18 1342 02/28/18 1357 02/28/18 1404 02/28/18 1422  BP: (!) 110/56 (!) 106/56  (!) 109/58  Pulse: 88 90  83  Resp: (!) 9 12  20   Temp:   (!) 97.2 F (36.2 C) 97.6 F (36.4 C)  TempSrc:    Oral  SpO2: 92% 93%  95%  Weight:      Height:       Weight change: -58.2 kg  Intake/Output Summary (Last 24 hours) at 02/28/2018 1426 Last data filed at 02/28/2018 1330 Gross per 24 hour  Intake 3066.37 ml  Output 725 ml  Net 2341.37 ml    Assessment/ Plan: Pt is a 62 y.o. yo male who was admitted on 01/27/2018 with AKI req transient HD- thought to be due to AIN vs ATN- now kidney function trending worse again with decreased UOP  Assessment/Plan: 1. Renal- prior AKI thought due to AIN vs ATN- ancef was stopped and put on dapto.  Kidneys appeared to recover but then started trending worse again.  Had been NPO -  gave IVF and UOP seemed to respond-  U/A with greater than 50 WBC- infection vs AIN- consider rocephin as a source of recurrent AIN and try to manipulate if possible - I will talk to pharmacy - send urine for culture and for eos  2. HTN/vol-giving some low level IVF 3. Anemia- ckd and hosp- has received ESA- nothing for now 4. Cholecystitis- abx and surgery - post op from lap chole 5. Hypokalemia- repleting today      [name]    Labs: Basic Metabolic Panel: Recent Labs  Lab 02/24/18 0336 02/25/18 0335 02/26/18 0350 02/27/18 0402 02/28/18 0350  NA 140 136 135 134* 134*  K 3.3* 4.2 3.6 3.4* 3.0*  CL 110 105 104 105 105  CO2 24 21* 21* 19* 19*  GLUCOSE 68* 231* 191* 178* 80  BUN 70* 65* 72* 84* 86*  CREATININE 2.25* 2.45* 2.66* 3.34* 3.15*  CALCIUM 7.9* 8.1* 8.0* 7.7* 7.4*  PHOS 4.1 3.8  --   --  4.9*   Liver Function Tests: Recent Labs  Lab  02/25/18 2055 02/26/18 0350 02/27/18 0402 02/28/18 0350  AST 21 49* 51*  --   ALT 23 25 27   --   ALKPHOS 140* 94 106  --   BILITOT 1.2 1.2 1.0  --   PROT 6.1* 6.2* 5.5*  --   ALBUMIN 2.3* 2.2* 1.9* 1.9*   No results for input(s): LIPASE, AMYLASE in the last 168 hours. No results for input(s): AMMONIA in the last 168 hours. CBC: Recent Labs  Lab 02/23/18 0338 02/26/18 0350 02/26/18 1208 02/28/18 0350  WBC 9.5 30.3* 29.2* 17.1*  HGB 8.3* 9.1* 8.8* 8.6*  HCT 27.5* 29.5* 29.5* 27.6*  MCV 90.8 89.7 90.5 88.7  PLT 225 236 237 230   Cardiac Enzymes: Recent Labs  Lab 02/23/18 0338 02/27/18 0402  CKTOTAL 187 169   CBG: Recent Labs  Lab 02/27/18 2240 02/28/18 0742 02/28/18 1021 02/28/18 1106 02/28/18 1318  GLUCAP 98 81 62* 125* 110*    Iron Studies: No results for input(s): IRON, TIBC, TRANSFERRIN, FERRITIN in the last 72 hours. Studies/Results: No results found. Medications: Infusions: . sodium chloride 0 mL/hr at 02/28/18 1202  . sodium chloride 75 mL/hr at 02/28/18 0330  .  sodium chloride    . acetaminophen    . cefTRIAXone (ROCEPHIN)  IV 2 g (02/27/18 1948)  . metronidazole 500 mg (02/28/18 0330)    Scheduled Medications: . Chlorhexidine Gluconate Cloth  6 each Topical Q0600  . feeding supplement  1 Container Oral TID WC  . insulin aspart  0-5 Units Subcutaneous QHS  . insulin aspart  0-9 Units Subcutaneous TID WC  . insulin aspart  4 Units Subcutaneous TID WC  . insulin glargine  22 Units Subcutaneous QHS  . latanoprost  1 drop Both Eyes QHS  . mouth rinse  15 mL Mouth Rinse BID  . metoprolol tartrate  50 mg Oral BID  . multivitamin  1 tablet Oral QHS  . nystatin   Topical BID  . pantoprazole  40 mg Oral Q1200  . saccharomyces boulardii  250 mg Oral BID  . traZODone  50 mg Oral QHS  . Warfarin - Pharmacist Dosing Inpatient   Does not apply q1800    have reviewed scheduled and prn medications.  Physical Exam: General: alert, NAD- dry  mucosa Heart: RRR Lungs:clear Abdomen: soft, non tender Extremities: pitting dep edema    02/28/2018,2:26 PM  LOS: 32 days

## 2018-02-28 NOTE — Anesthesia Postprocedure Evaluation (Signed)
Anesthesia Post Note  Patient: [name]  Procedure(s) Performed: LAPAROSCOPIC CHOLECYSTECTOMY (N/A Abdomen)     Patient location during evaluation: PACU Anesthesia Type: General Level of consciousness: awake and alert Pain management: pain level controlled Vital Signs Assessment: post-procedure vital signs reviewed and stable Respiratory status: spontaneous breathing, nonlabored ventilation, respiratory function stable and patient connected to nasal cannula oxygen Cardiovascular status: blood pressure returned to baseline and stable Postop Assessment: no apparent nausea or vomiting Anesthetic complications: no    Last Vitals:  Vitals:   02/28/18 1649 02/28/18 1730  BP: 117/63 122/66  Pulse: 80 81  Resp: 19 19  Temp: 36.7 C (!) 36.4 C  SpO2: 97% 97%    Last Pain:  Vitals:   02/28/18 1730  TempSrc: Oral  PainSc:                  Anaise Sterbenz

## 2018-02-28 NOTE — Progress Notes (Signed)
   02/28/18 1400  Clinical Encounter Type  Visited With Family;Patient;Patient and family together  Visit Type Initial  Referral From Nurse  Consult/Referral To Chaplain  Spiritual Encounters  Spiritual Needs Prayer;Emotional;Grief support  Stress Factors  Patient Stress Factors Exhausted;Health changes;Major life changes  Family Stress Factors Exhausted;Health changes;Major life changes   Responded to page consult. Family (brother) was waiting on PT to come back from surgery. PT arrived a few minutes later. Doctor arrived and spoke with PT and brother. PT was thankful and grateful for the Chaplain visit. PT was very emotional and concerned for his recovery. I offered spiritual care with ministry of presence, a listening ear, words of encouragement and prayer. Follow up at PT's request.   Colonel Bald [phone]

## 2018-02-28 NOTE — Progress Notes (Signed)
Pharmacy Antibiotic Note  [name] is a 62 y.o. male admitted on 01/27/2018 with gangrenous cholecystitis.  Pharmacy has been consulted for zosyn  dosing. CrCl <20 ml/min, therefore will adjust dose.   Plan: Zosyn 3.375gm IV q12h for 7 days Monitor for clinical course, and de-escalation to doxycycline at completion of zosyn therapy.   Height: 5\' 10"  (177.8 cm) Weight: 106 lb (48.1 kg) IBW/kg (Calculated) : 73  Temp (24hrs), Avg:97.6 F (36.4 C), Min:97.2 F (36.2 C), Max:98.2 F (36.8 C)  Recent Labs  Lab 02/23/18 0338 02/24/18 0336 02/25/18 0335 02/26/18 0350 02/26/18 1208 02/27/18 0402 02/28/18 0350  WBC 9.5  --   --  30.3* 29.2*  --  17.1*  CREATININE 2.56* 2.25* 2.45* 2.66*  --  3.34* 3.15*    Estimated Creatinine Clearance: 16.8 mL/min (A) (by C-G formula based on SCr of 3.15 mg/dL (H)).    Allergies  Allergen Reactions  . Ace Inhibitors Cough  . Cefazolin     Possible cause of AKI, AIN    Aharon Carriere A. Levada Dy, PharmD, [place] Pager: [phone] Please utilize Amion for appropriate phone number to reach the unit pharmacist ([place])    02/28/2018 3:14 PM

## 2018-03-01 ENCOUNTER — Encounter (HOSPITAL_COMMUNITY): Payer: Self-pay | Admitting: Surgery

## 2018-03-01 DIAGNOSIS — Z872 Personal history of diseases of the skin and subcutaneous tissue: Secondary | ICD-10-CM

## 2018-03-01 DIAGNOSIS — Z8679 Personal history of other diseases of the circulatory system: Secondary | ICD-10-CM

## 2018-03-01 DIAGNOSIS — Z9049 Acquired absence of other specified parts of digestive tract: Secondary | ICD-10-CM

## 2018-03-01 DIAGNOSIS — R197 Diarrhea, unspecified: Secondary | ICD-10-CM

## 2018-03-01 DIAGNOSIS — M4645 Discitis, unspecified, thoracolumbar region: Secondary | ICD-10-CM

## 2018-03-01 DIAGNOSIS — M4625 Osteomyelitis of vertebra, thoracolumbar region: Secondary | ICD-10-CM

## 2018-03-01 DIAGNOSIS — N179 Acute kidney failure, unspecified: Secondary | ICD-10-CM

## 2018-03-01 DIAGNOSIS — Z952 Presence of prosthetic heart valve: Secondary | ICD-10-CM

## 2018-03-01 DIAGNOSIS — I10 Essential (primary) hypertension: Secondary | ICD-10-CM

## 2018-03-01 LAB — RENAL FUNCTION PANEL
ANION GAP: 10 (ref 5–15)
Albumin: 2 g/dL — ABNORMAL LOW (ref 3.5–5.0)
BUN: 80 mg/dL — ABNORMAL HIGH (ref 8–23)
CHLORIDE: 107 mmol/L (ref 98–111)
CO2: 18 mmol/L — AB (ref 22–32)
Calcium: 7.5 mg/dL — ABNORMAL LOW (ref 8.9–10.3)
Creatinine, Ser: 2.77 mg/dL — ABNORMAL HIGH (ref 0.61–1.24)
GFR calc non Af Amer: 23 mL/min — ABNORMAL LOW (ref >60)
GFR, EST AFRICAN AMERICAN: 27 mL/min — AB (ref >60)
GLUCOSE: 267 mg/dL — AB (ref 70–99)
Phosphorus: 5.7 mg/dL — ABNORMAL HIGH (ref 2.5–4.6)
Potassium: 4 mmol/L (ref 3.5–5.1)
SODIUM: 135 mmol/L (ref 135–145)

## 2018-03-01 LAB — URINE CULTURE: CULTURE: NO GROWTH

## 2018-03-01 LAB — PROTIME-INR
INR: 1.88
Prothrombin Time: 21.4 seconds — ABNORMAL HIGH (ref 11.4–15.2)

## 2018-03-01 LAB — CBC
HEMATOCRIT: 31.5 % — AB (ref 39.0–52.0)
HEMOGLOBIN: 9.6 g/dL — AB (ref 13.0–17.0)
MCH: 27.3 pg (ref 26.0–34.0)
MCHC: 30.5 g/dL (ref 30.0–36.0)
MCV: 89.5 fL (ref 80.0–100.0)
NRBC: 0 % (ref 0.0–0.2)
Platelets: 242 10*3/uL (ref 150–400)
RBC: 3.52 MIL/uL — AB (ref 4.22–5.81)
RDW: 14.7 % (ref 11.5–15.5)
WBC: 12.1 10*3/uL — ABNORMAL HIGH (ref 4.0–10.5)

## 2018-03-01 LAB — GLUCOSE, CAPILLARY
GLUCOSE-CAPILLARY: 317 mg/dL — AB (ref 70–99)
GLUCOSE-CAPILLARY: 175 mg/dL — AB (ref 70–99)
Glucose-Capillary: 262 mg/dL — ABNORMAL HIGH (ref 70–99)
Glucose-Capillary: 141 mg/dL — ABNORMAL HIGH (ref 70–99)

## 2018-03-01 MED ORDER — SODIUM CHLORIDE 0.9 % IV SOLN: 8.0000 mg/kg | INTRAVENOUS | Status: DC

## 2018-03-01 MED ORDER — DOXYCYCLINE HYCLATE 100 MG PO TABS: 100.0000 mg | ORAL_TABLET | Freq: Two times a day (BID) | ORAL | Status: DC

## 2018-03-01 NOTE — Progress Notes (Signed)
Pharmacy Antibiotic Note  [name] is a 62 y.o. male admitted on 01/27/2018 with gangrenous cholecystitis.  Pharmacy has been consulted for zosyn  dosing. CrCl <20 ml/min, therefore will adjust dose.   Zosyn will be stopped on 10/31. Dapto will be resumed at that time until 03/12/18. Then he would be transition to PO doxy.   Plan: Zosyn 3.375gm IV q12h for 7 days Daptomycin will be resumed at that time til 03/12/18  Coumadin will be resumed tomorrow  Height: 5\' 10"  (177.8 cm) Weight: 106 lb (48.1 kg) IBW/kg (Calculated) : 73  Temp (24hrs), Avg:97.6 F (36.4 C), Min:97.2 F (36.2 C), Max:98 F (36.7 C)  Recent Labs  Lab 02/23/18 0338  02/25/18 0335 02/26/18 0350 02/26/18 1208 02/27/18 0402 02/28/18 0350 03/01/18 0340  WBC 9.5  --   --  30.3* 29.2*  --  17.1* 12.1*  CREATININE 2.56*   < > 2.45* 2.66*  --  3.34* 3.15* 2.77*   < > = values in this interval not displayed.    Estimated Creatinine Clearance: 19.1 mL/min (A) (by C-G formula based on SCr of 2.77 mg/dL (H)).    Allergies  Allergen Reactions  . Ace Inhibitors Cough  . Cefazolin     Possible cause of AKI, AIN    Dwayne A. Levada Dy, PharmD, [place] Pager: [phone] Please utilize Amion for appropriate phone number to reach the unit pharmacist ([place])    03/01/2018 12:01 PM

## 2018-03-01 NOTE — Progress Notes (Signed)
[facility] Progress Note  1 Day Post-Op  Subjective: CC:  Pain improving. Tolerating clears without nausea or vomiting. Having flatus. Has not been OOB since surgery, and reports little to no time out of bed for the past week due to abdominal pain. Pt reports high drain output since surgery, has become less bloody, denies seen green or brown fluid in drain.  Objective: Vital signs in last 24 hours: Temp:  [97.2 F (36.2 C)-98.2 F (36.8 C)] 97.5 F (36.4 C) (10/26 0829) Pulse Rate:  [79-93] 81 (10/26 0829) Resp:  [9-24] 18 (10/26 0829) BP: (106-136)/(56-74) 126/68 (10/26 0829) SpO2:  [92 %-100 %] 100 % (10/26 0829) Last BM Date: 02/27/18  Intake/Output from previous day: 10/25 0701 - 10/26 0700 In: 2625.2 [P.O.:360; I.V.:1775.3; IV Piggyback:489.9] Out: 2230 [Urine:1325; Drains:905] Intake/Output this shift: Total I/O In: -  Out: 60 [Drains:60]  PE: Gen:  Alert, NAD, pleasant Pulm:  Normal effort Abd: Soft, appropriate incisional tenderness, incisions clean and dry, JP drain with SS, mostly serous, output, no obvious bile.  Skin: warm and dry, no rashes  Psych: A&Ox3   Lab Results:  Recent Labs    02/28/18 0350 03/01/18 0340  WBC 17.1* 12.1*  HGB 8.6* 9.6*  HCT 27.6* 31.5*  PLT 230 242   BMET Recent Labs    02/28/18 0350 03/01/18 0340  NA 134* 135  K 3.0* 4.0  CL 105 107  CO2 19* 18*  GLUCOSE 80 267*  BUN 86* 80*  CREATININE 3.15* 2.77*  CALCIUM 7.4* 7.5*   PT/INR Recent Labs    02/28/18 0350 03/01/18 0340  LABPROT 20.4* 21.4*  INR 1.77 1.88   CMP     Component Value Date/Time   NA 135 03/01/2018 0340   K 4.0 03/01/2018 0340   CL 107 03/01/2018 0340   CO2 18 (L) 03/01/2018 0340   GLUCOSE 267 (H) 03/01/2018 0340   BUN 80 (H) 03/01/2018 0340   CREATININE 2.77 (H) 03/01/2018 0340   CALCIUM 7.5 (L) 03/01/2018 0340   PROT 5.5 (L) 02/27/2018 0402   ALBUMIN 2.0 (L) 03/01/2018 0340   AST 51 (H) 02/27/2018 0402   ALT 27 02/27/2018  0402   ALKPHOS 106 02/27/2018 0402   BILITOT 1.0 02/27/2018 0402   GFRNONAA 23 (L) 03/01/2018 0340   GFRAA 27 (L) 03/01/2018 0340   Lipase     Component Value Date/Time   LIPASE 23 01/14/2018 1926       Studies/Results: No results found.  Anti-infectives: Anti-infectives (From admission, onward)   Start     Dose/Rate Route Frequency Ordered Stop   02/28/18 1530  piperacillin-tazobactam (ZOSYN) IVPB 3.375 g     3.375 g 12.5 mL/hr over 240 Minutes Intravenous Every 12 hours 02/28/18 1518 03/07/18 0959   02/26/18 2000  cefTRIAXone (ROCEPHIN) 2 g in sodium chloride 0.9 % 100 mL IVPB  Status:  Discontinued     2 g 200 mL/hr over 30 Minutes Intravenous Every 24 hours 02/26/18 1911 02/28/18 1500   02/26/18 2000  metroNIDAZOLE (FLAGYL) IVPB 500 mg  Status:  Discontinued     500 mg 100 mL/hr over 60 Minutes Intravenous Every 8 hours 02/26/18 1911 02/28/18 1500   02/24/18 0000  daptomycin (CUBICIN) IVPB     700 mg Intravenous Every 24 hours 02/24/18 1141 03/09/18 2359   02/23/18 2000  DAPTOmycin (CUBICIN) 700 mg in sodium chloride 0.9 % IVPB  Status:  Discontinued     700 mg 228 mL/hr over 30 Minutes Intravenous  Every 24 hours 02/23/18 1233 02/26/18 1911   02/13/18 1415  ceFAZolin (ANCEF) powder 2 g     2 g Other To Surgery 02/13/18 1410 02/13/18 2142   02/13/18 1347  ceFAZolin (ANCEF) 2-4 GM/100ML-% IVPB    Note to Pharmacy:  Arlean Hopping   : cabinet override      02/13/18 1347 02/13/18 1410   01/30/18 2000  DAPTOmycin (CUBICIN) 700 mg in sodium chloride 0.9 % IVPB  Status:  Discontinued     700 mg 228 mL/hr over 30 Minutes Intravenous Every 48 hours 01/30/18 1306 02/23/18 1233   01/30/18 1000  fluconazole (DIFLUCAN) tablet 50 mg     50 mg Oral Daily 01/29/18 0930 02/04/18 0914   01/29/18 2000  DAPTOmycin (CUBICIN) 700 mg in sodium chloride 0.9 % IVPB  Status:  Discontinued     700 mg 228 mL/hr over 30 Minutes Intravenous Every 48 hours 01/29/18 1013 01/30/18 1306   01/29/18  1030  fluconazole (DIFLUCAN) tablet 100 mg     100 mg Oral  Once 01/29/18 0930 01/29/18 1121   01/28/18 2200  ceFAZolin (ANCEF) IVPB 1 g/50 mL premix  Status:  Discontinued     1 g 100 mL/hr over 30 Minutes Intravenous Every 24 hours 01/28/18 1007 01/28/18 1231   01/27/18 2245  ceFAZolin (ANCEF) IVPB 2g/100 mL premix  Status:  Discontinued     2 g 200 mL/hr over 30 Minutes Intravenous Every 12 hours 01/27/18 2243 01/28/18 1007   01/27/18 2230  ceFAZolin (ANCEF) IVPB  Status:  Discontinued    Note to Pharmacy:  Indication:  MSSA bacteremia/endocarditis Last Day of Therapy:  03/08/18 Labs - Once weekly:  CBC/D and BMP, Labs - Every other week:  ESR and CRP     2 g Intravenous Every 8 hours 01/27/18 2220 01/27/18 2227   01/27/18 2230  ceFAZolin (ANCEF) IVPB 2g/100 mL premix  Status:  Discontinued     2 g 200 mL/hr over 30 Minutes Intravenous Every 8 hours 01/27/18 2227 01/27/18 2243       Assessment/Plan MSSA bacteremia Osteomyelitis of RLE, s/p BKA H/o L BKA Endocarditis ARF, just off HD ParoxysmalA fib- oncoumadin, INR 2.5 DM-II PVD H/o MI  Acute, gangrenous cholecystitis POD#1 s/p laparoscopic cholecystectomy, placement 81F blake drain - afebrile, VSS, WBC 12 from 17 - continue IV abx - tolerating clears, ok to gradually advance to heart healthy diet. - drain 905cc/24h, continue drain and follow output. - OOB and use IS q 1h  ID -currently daptomycin 9/25-10/25, Zosyn 10/25 >>  FEN -IVF, CLD VTE -ok to resume coumadin tomorrow 10/27 Foley -none   LOS: 33 days    Obie Dredge, [facility] Pager: [phone]

## 2018-03-01 NOTE — Progress Notes (Signed)
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 He was discharged to SNF and developed acute oliguria - presented to the ED where he was found to have AKI.  Developed acute respiratory failure necessitating transfer to ICU.  Temporary dialysis catheter was placed for HD. s/p lap cholecystectomy 10/25.    PT Comments    Patient s/p cholecystectomy 10/25 and reports pain in right abdomen where tube is placed. Pt agreeable to OOB. Tolerated rolling, bed mobility and AP transfer with Mod-Max A of 2. Able to use UEs to assist with transfer. Reviewed some exercises to perform while in chair. Encouraged pressure relief while sitting in chair. Geo mat under bottom. Will continue to follow and progress as tolerated.   Follow Up Recommendations  SNF;Supervision/Assistance - 24 hour     Equipment Recommendations  None recommended by PT    Recommendations for Other Services       Precautions / Restrictions Precautions Precautions: Fall Precaution Comments: JP drain Required Braces or Orthoses: Other Brace/Splint Other Brace/Splint: limb protector Restrictions Weight Bearing Restrictions: No Other Position/Activity Restrictions: Has a prosthesis for LLE    Mobility  Bed Mobility Overal bed mobility: Needs Assistance Bed Mobility: Supine to Sit;Rolling     Supine to sit: Max assist;+2 for physical assistance     General bed mobility comments: Rolling to right/left with min A to position pad; Able to perform long sitting with Max A of 2 to prepare for AP transfer.  Transfers Overall transfer level: Needs assistance Equipment used: None Transfers: Scientific laboratory technician transfers: Mod assist;+2 physical assistance   General transfer comment: Assist of 2 for AP transfer into chair; pt able to use UEs to assist.   Ambulation/Gait                 Stairs             Wheelchair Mobility    Modified Rankin (Stroke Patients Only)       Balance Overall balance assessment: Needs assistance Sitting-balance support: Bilateral upper extremity supported Sitting balance-Leahy Scale: Poor Sitting balance - Comments: Requires BUe support and Mod A for sitting balance- air mattress and uneven surface not helpful.                                    Cognition Arousal/Alertness: Awake/alert Behavior During Therapy: WFL for tasks assessed/performed Overall Cognitive Status: Within Functional Limits for tasks assessed                                        Exercises Amputee Exercises Straight Leg Raises: AROM;10 reps;Both    General Comments        Pertinent Vitals/Pain Pain Assessment: Faces Faces Pain Scale: Hurts little more Pain Location: drain site Pain Descriptors / Indicators: Discomfort;Sore Pain Intervention(s): Monitored during session;Repositioned    Home Living                      Prior Function  PT Goals (current goals can now be found in the care plan section) Progress towards PT goals: Progressing toward goals    Frequency    Min 2X/week      PT Plan Current plan remains appropriate    Co-evaluation              AM-PAC PT "6 Clicks" Daily Activity  Outcome Measure  Difficulty turning over in bed (including adjusting bedclothes, sheets and blankets)?: Unable Difficulty moving from lying on back to sitting on the side of the bed? : Unable Difficulty sitting down on and standing up from a chair with arms (e.g., wheelchair, bedside commode, etc,.)?: Unable Help needed moving to and from a bed to chair (including a  wheelchair)?: A Lot Help needed walking in hospital room?: Total Help needed climbing 3-5 steps with a railing? : Total 6 Click Score: 7    End of Session   Activity Tolerance: Patient tolerated treatment well Patient left: in chair;with call bell/phone within reach;with chair alarm set Nurse Communication: Mobility status;Need for lift equipment(maxi move to return to bed) PT Visit Diagnosis: Other abnormalities of gait and mobility (R26.89) Pain - Right/Left: Right Pain - part of body: (abdomen)     Time: 9311-2162 PT Time Calculation (min) (ACUTE ONLY): 24 min  Charges:  $Therapeutic Activity: 23-37 mins                     Wray Kearns, PT, DPT Acute Rehabilitation Services Pager [phone] Office [place] 03/01/2018, 2:42 PM

## 2018-03-01 NOTE — Progress Notes (Signed)
Subjective:  UOP 1300- kidney function numbers improved-  abx changed to zosyn and dapto - c/o loose stools , wants imodium also wants to stay in hospital longer   Objective Vital signs in last 24 hours: Vitals:   02/28/18 1730 02/28/18 2030 03/01/18 0350 03/01/18 0829  BP: 122/66 110/60 133/70 126/68  Pulse: 81 79 80 81  Resp: 19 16 18 18   Temp: (!) 97.5 F (36.4 C) (!) 97.5 F (36.4 C) 98 F (36.7 C) (!) 97.5 F (36.4 C)  TempSrc: Oral Oral  Oral  SpO2: 97% 96% 98% 100%  Weight:      Height:       Weight change:   Intake/Output Summary (Last 24 hours) at 03/01/2018 1205 Last data filed at 03/01/2018 1100 Gross per 24 hour  Intake 2958.74 ml  Output 2690 ml  Net 268.74 ml    Assessment/ Plan: Pt is a 62 y.o. yo male who was admitted on 01/27/2018 with AKI req transient HD- thought to be due to AIN vs ATN- recalled when kidney function trending worse again with decreased UOP  Assessment/Plan: 1. Renal- prior AKI thought due to AIN vs ATN- ancef was stopped and put on dapto.  Kidneys appeared to recover but then started trending worse again.  Had been NPO -  gave IVF and UOP seemed to respond-  U/A with greater than 50 WBC- infection vs AIN- consider rocephin as a source of recurrent AIN and try to manipulate if possible, now on zosyn - fortunately renal function is trending better 2. HTN/vol-giving some low level IVF- some edema so will stop  3. Anemia- ckd and hosp- has received ESA- nothing for now 4. Cholecystitis- abx and surgery - post op from lap chole 5. Hypokalemia- repleted- now [place]: Basic Metabolic Panel: Recent Labs  Lab 02/25/18 0335  02/27/18 0402 02/28/18 0350 03/01/18 0340  NA 136   < > 134* 134* 135  K 4.2   < > 3.4* 3.0* 4.0  CL 105   < > 105 105 107  CO2 21*   < > 19* 19* 18*  GLUCOSE 231*   < > 178* 80 267*  BUN 65*   < > 84* 86* 80*  CREATININE 2.45*   < > 3.34* 3.15* 2.77*  CALCIUM 8.1*   < > 7.7* 7.4* 7.5*   PHOS 3.8  --   --  4.9* 5.7*   < > = values in this interval not displayed.   Liver Function Tests: Recent Labs  Lab 02/25/18 2055 02/26/18 0350 02/27/18 0402 02/28/18 0350 03/01/18 0340  AST 21 49* 51*  --   --   ALT 23 25 27   --   --   ALKPHOS 140* 94 106  --   --   BILITOT 1.2 1.2 1.0  --   --   PROT 6.1* 6.2* 5.5*  --   --   ALBUMIN 2.3* 2.2* 1.9* 1.9* 2.0*   No results for input(s): LIPASE, AMYLASE in the last 168 hours. No results for input(s): AMMONIA in the last 168 hours. CBC: Recent Labs  Lab 02/23/18 0338 02/26/18 0350 02/26/18 1208 02/28/18 0350 03/01/18 0340  WBC 9.5 30.3* 29.2* 17.1* 12.1*  HGB 8.3* 9.1* 8.8* 8.6* 9.6*  HCT 27.5* 29.5* 29.5* 27.6* 31.5*  MCV 90.8 89.7 90.5 88.7 89.5  PLT 225 236 237 230 242   Cardiac Enzymes: Recent Labs  Lab 02/23/18 0338 02/27/18 0402  CKTOTAL 187 169   CBG: Recent Labs  Lab 02/28/18 1318 02/28/18 1424 02/28/18 1648 02/28/18 2149 03/01/18 0830  GLUCAP 110* 112* 105* 190* 262*    Iron Studies: No results for input(s): IRON, TIBC, TRANSFERRIN, FERRITIN in the last 72 hours. Studies/Results: No results found. Medications: Infusions: . sodium chloride 0 mL/hr at 02/28/18 1202  . sodium chloride 75 mL/hr at 03/01/18 1016  . sodium chloride    . [START ON 03/05/2018] DAPTOmycin (CUBICIN)  IV    . piperacillin-tazobactam (ZOSYN)  IV 12.5 mL/hr at 03/01/18 1016    Scheduled Medications: . Chlorhexidine Gluconate Cloth  6 each Topical Q0600  . feeding supplement  1 Container Oral TID WC  . insulin aspart  0-5 Units Subcutaneous QHS  . insulin aspart  0-9 Units Subcutaneous TID WC  . insulin aspart  4 Units Subcutaneous TID WC  . insulin glargine  22 Units Subcutaneous QHS  . latanoprost  1 drop Both Eyes QHS  . mouth rinse  15 mL Mouth Rinse BID  . metoprolol tartrate  50 mg Oral BID  . multivitamin  1 tablet Oral QHS  . nystatin   Topical BID  . pantoprazole  40 mg Oral Q1200  . saccharomyces  boulardii  250 mg Oral BID  . traZODone  50 mg Oral QHS  . Warfarin - Pharmacist Dosing Inpatient   Does not apply q1800    have reviewed scheduled and prn medications.  Physical Exam: General: alert, NAD- looks beter Heart: RRR Lungs:clear Abdomen: soft, non tender Extremities:  Some pitting dep edema    03/01/2018,12:05 PM  LOS: 33 days

## 2018-03-01 NOTE — Progress Notes (Signed)
PROGRESS NOTE    [name]  SKA:[id number] DOB: [birth year] DOA: 01/27/2018 PCP: [name], MD    Brief Narrative:  62 y.o. male with a history of sepsis due to right foot osteomyelitis s/p transtibial amputation and found to have MSSA bacteremia with splenic infarct and tricuspid valve endocarditis. He was discharged on ancef to [facility] but was quickly brought back in due to oliguria and found to have acute renal failure. He developed acute respiratory failure and transferred to ICU on CCM service. Antibiotics changed to daptomycin per ID and nephrology oversaw hemodialysis through right IJ temporary catheter on 10/12 and 10/14. With improvement in respiratory status he was transferred to the medical floor. Nephrology following closely for signs of renal recovery. Creatinine trending downward as of 10/17 with increasing urine output. Temporary HD catheter removed 10/21. On 10/22 the patient developed acute abdominal pain worse on the right with nausea, vomiting, and inability to take po. CT abd/pelvis demonstrated distended gallbladder with gallstones concerning for cholecystitis. Also showed anasarca, ascites, and pleural effusions, progressive T12-L1 changes noticed on previous study, and stable splenic lesions.   Assessment & Plan:   Principal Problem:   Acute renal failure (ARF) (HCC) Active Problems:   Insulin-requiring or dependent type II diabetes mellitus ([place])   Essential hypertension   CAD (coronary artery disease)   Leukocytosis   Thrombocytopenia (HCC)   S/P BKA (below knee amputation) unilateral, right (HCC)   Endocarditis of tricuspid valve   Hyponatremia   PAF (paroxysmal atrial fibrillation) (HCC)   Aortic atherosclerosis (HCC)   Acute hypoxemic respiratory failure (HCC)  Oliguric acute renal failure: Due to ATN vs. ancef-induced AIN. Continued slow improvement until 10/22. CrCl 43m/min and UOP satisfactory. Pulled temporary catheter 10/21. Renal  function had worsened. Nephrology continuing to follow. Improved with IVF hydration  Acute abdominal pain, nausea, vomiting secondary to gangrenous cholecystitis: CT abd/pelvis with cholelithiasis and consistent changes with cholecystitis, though with diffuse edema/anasarca, could be false positive.  - HIDA positive for acute cholecystitis -General surgery following and pt is now s/p cholecystectomy -Discussed with ID. Recommendation to continue on zosyn x 5 days, then transition to doxy afterwards  Paroxysmal atrial fibrillation: New diagnosis during admission. Maintaining sinus rhythm. Cardiology consulted, signed off 10/3. - Continue coumadin dosed per pharmacy. - Continue metoprolol - Follow up with Dr. [name] 3-4 weeks post discharge.  - currently rate controlled  MSSA bacteremia and tricuspid valve endocarditis and splenic infarcts: Stopped ancef per above - Initial recommendations to complete daptomycin to complete 6 weeks Tx (through 11/2). - CK transiently mildly elevated but since resolved. Recheck CK weekly. - Due to continued T12-L1 lesion, seen on CT 10/22 progressing since previous exam, ID recommends starting doxycycline 1088mpo BID on a full stomach following completion of daptomycin (initial C&S reviewed, was pansensitive). -discussed with ID with recommendation for transition to zosyn x 5 days the transition to doxy afterwrds  Diarrhea: No indication for CDiff testing. Possibly due to malabsorption/cholecystitis, though this symptom preceded abd pain/N/V.  - Patient is continued on probiotics 2 weeks (final dose 11/16) following conclusion of antibiotics. -Thus far 2 loose stools today. WBC trending down. Pateint denies abd pain -Will cont to observe for now. If diarrhea worsens, would check cdiff at that time  PVD s/p bilateral transtibial amputations: Reevaluated by Dr. [name] this hospitalization, staples removed from recent right TTA on 10/15.  -  Anticipate prosthetic fitting as outpatient  Anemia of acute and chronic disease: Stable hgb.  -  Got aranesp  10/18. -Repeat CBC in AM  T12-L1 Discitis/osteomyelitis: Loss of cortex along anterior inferior aspect of T12 vertebral body, lucency in posterior aspect of L1 vertebral body. Seems to have progressed since previous exam.  - D/w ID, will prolong antibiotics course with doxycycline as above.  - Will need MRI w/ and w/o contrast for further evaluation closer to conclusion of antibiotics. Not complaining of significant back pain. - Remains stable at this time  Acute hypoxic respiratory failure:  -Due to pulmonary edema. -Remains on minimal O2 support  Iron deficiency anemia and anemia of acute illness:  - Hgb stable at this time 8's. - Continue iron supplementation as tolerated  T2DM: A1c 8.5%.  - continue SSI coverage while in hospital  Thrombocytopenia: Improved  Oral candidiasis: Treated with fluconazole  Obesity: BMI 34.  - Dietitian was consulted  DVT prophylaxis: coumadin Code Status: Full Family Communication: Pt in room, family not at bedside Disposition Plan: SNF, timing uncertain  Consultants:   ID  IR  Cardiology  PCCM  Nephrology  General surgery  Procedures:   Temporary HD catheter  HD  Cholecystectomy 10/25  Antimicrobials: Anti-infectives (From admission, onward)   Start     Dose/Rate Route Frequency Ordered Stop   03/13/18 1000  doxycycline (VIBRA-TABS) tablet 100 mg     100 mg Oral Every 12 hours 03/01/18 1217 04/10/18 0959   03/06/18 1800  DAPTOmycin (CUBICIN) 385 mg in sodium chloride 0.9 % IVPB     8 mg/kg  48.1 kg 215.4 mL/hr over 30 Minutes Intravenous Every 48 hours 03/01/18 1213 03/12/18 2359   03/05/18 1800  DAPTOmycin (CUBICIN) 385 mg in sodium chloride 0.9 % IVPB  Status:  Discontinued     8 mg/kg  48.1 kg 215.4 mL/hr over 30 Minutes Intravenous Every 48 hours 03/01/18 1200 03/01/18 1213   02/28/18 1530   piperacillin-tazobactam (ZOSYN) IVPB 3.375 g     3.375 g 12.5 mL/hr over 240 Minutes Intravenous Every 12 hours 02/28/18 1518 03/07/18 0959   02/26/18 2000  cefTRIAXone (ROCEPHIN) 2 g in sodium chloride 0.9 % 100 mL IVPB  Status:  Discontinued     2 g 200 mL/hr over 30 Minutes Intravenous Every 24 hours 02/26/18 1911 02/28/18 1500   02/26/18 2000  metroNIDAZOLE (FLAGYL) IVPB 500 mg  Status:  Discontinued     500 mg 100 mL/hr over 60 Minutes Intravenous Every 8 hours 02/26/18 1911 02/28/18 1500   02/24/18 0000  daptomycin (CUBICIN) IVPB     700 mg Intravenous Every 24 hours 02/24/18 1141 03/09/18 2359   02/23/18 2000  DAPTOmycin (CUBICIN) 700 mg in sodium chloride 0.9 % IVPB  Status:  Discontinued     700 mg 228 mL/hr over 30 Minutes Intravenous Every 24 hours 02/23/18 1233 02/26/18 1911   02/13/18 1415  ceFAZolin (ANCEF) powder 2 g     2 g Other To Surgery 02/13/18 1410 02/13/18 2142   02/13/18 1347  ceFAZolin (ANCEF) 2-4 GM/100ML-% IVPB    Note to Pharmacy:  Arlean Hopping   : cabinet override      02/13/18 1347 02/13/18 1410   01/30/18 2000  DAPTOmycin (CUBICIN) 700 mg in sodium chloride 0.9 % IVPB  Status:  Discontinued     700 mg 228 mL/hr over 30 Minutes Intravenous Every 48 hours 01/30/18 1306 02/23/18 1233   01/30/18 1000  fluconazole (DIFLUCAN) tablet 50 mg     50 mg Oral Daily 01/29/18 0930 02/04/18 0914   01/29/18 2000  DAPTOmycin (  CUBICIN) 700 mg in sodium chloride 0.9 % IVPB  Status:  Discontinued     700 mg 228 mL/hr over 30 Minutes Intravenous Every 48 hours 01/29/18 1013 01/30/18 1306   01/29/18 1030  fluconazole (DIFLUCAN) tablet 100 mg     100 mg Oral  Once 01/29/18 0930 01/29/18 1121   01/28/18 2200  ceFAZolin (ANCEF) IVPB 1 g/50 mL premix  Status:  Discontinued     1 g 100 mL/hr over 30 Minutes Intravenous Every 24 hours 01/28/18 1007 01/28/18 1231   01/27/18 2245  ceFAZolin (ANCEF) IVPB 2g/100 mL premix  Status:  Discontinued     2 g 200 mL/hr over 30 Minutes  Intravenous Every 12 hours 01/27/18 2243 01/28/18 1007   01/27/18 2230  ceFAZolin (ANCEF) IVPB  Status:  Discontinued    Note to Pharmacy:  Indication:  MSSA bacteremia/endocarditis Last Day of Therapy:  03/08/18 Labs - Once weekly:  CBC/D and BMP, Labs - Every other week:  ESR and CRP     2 g Intravenous Every 8 hours 01/27/18 2220 01/27/18 2227   01/27/18 2230  ceFAZolin (ANCEF) IVPB 2g/100 mL premix  Status:  Discontinued     2 g 200 mL/hr over 30 Minutes Intravenous Every 8 hours 01/27/18 2227 01/27/18 2243      Subjective: States previous abd pain much better. Reports loose stools  Objective: Vitals:   02/28/18 1730 02/28/18 2030 03/01/18 0350 03/01/18 0829  BP: 122/66 110/60 133/70 126/68  Pulse: 81 79 80 81  Resp: '19 16 18 18  ' Temp: (!) 97.5 F (36.4 C) (!) 97.5 F (36.4 C) 98 F (36.7 C) (!) 97.5 F (36.4 C)  TempSrc: Oral Oral  Oral  SpO2: 97% 96% 98% 100%  Weight:      Height:        Intake/Output Summary (Last 24 hours) at 03/01/2018 1456 Last data filed at 03/01/2018 1100 Gross per 24 hour  Intake 2013.78 ml  Output 2615 ml  Net -601.22 ml   Filed Weights   02/26/18 0000 02/26/18 2049 02/28/18 0539  Weight: 106 kg 106.3 kg 48.1 kg    Examination: General exam: Awake, laying in bed, in nad Respiratory system: Normal respiratory effort, no wheezing Cardiovascular system: regular rate, s1, s2 Gastrointestinal system: Soft, nondistended, positive BS Central nervous system: CN2-12 grossly intact, strength intact Extremities: Perfused, no clubbing Skin: Normal skin turgor, no notable skin lesions seen Psychiatry: Mood normal // no visual hallucinations   Data Reviewed: I have personally reviewed following labs and imaging studies  CBC: Recent Labs  Lab 02/23/18 0338 02/26/18 0350 02/26/18 1208 02/28/18 0350 03/01/18 0340  WBC 9.5 30.3* 29.2* 17.1* 12.1*  HGB 8.3* 9.1* 8.8* 8.6* 9.6*  HCT 27.5* 29.5* 29.5* 27.6* 31.5*  MCV 90.8 89.7 90.5 88.7  89.5  PLT 225 236 237 230 778   Basic Metabolic Panel: Recent Labs  Lab 02/23/18 0338 02/24/18 0336 02/25/18 0335 02/26/18 0350 02/27/18 0402 02/28/18 0350 03/01/18 0340  NA 139 140 136 135 134* 134* 135  K 3.5 3.3* 4.2 3.6 3.4* 3.0* 4.0  CL 106 110 105 104 105 105 107  CO2 24 24 21* 21* 19* 19* 18*  GLUCOSE 139* 68* 231* 191* 178* 80 267*  BUN 77* 70* 65* 72* 84* 86* 80*  CREATININE 2.56* 2.25* 2.45* 2.66* 3.34* 3.15* 2.77*  CALCIUM 7.8* 7.9* 8.1* 8.0* 7.7* 7.4* 7.5*  PHOS 4.7* 4.1 3.8  --   --  4.9* 5.7*   GFR: Estimated Creatinine  Clearance: 19.1 mL/min (A) (by C-G formula based on SCr of 2.77 mg/dL (H)). Liver Function Tests: Recent Labs  Lab 02/25/18 2055 02/26/18 0350 02/27/18 0402 02/28/18 0350 03/01/18 0340  AST 21 49* 51*  --   --   ALT '23 25 27  ' --   --   ALKPHOS 140* 94 106  --   --   BILITOT 1.2 1.2 1.0  --   --   PROT 6.1* 6.2* 5.5*  --   --   ALBUMIN 2.3* 2.2* 1.9* 1.9* 2.0*   No results for input(s): LIPASE, AMYLASE in the last 168 hours. No results for input(s): AMMONIA in the last 168 hours. Coagulation Profile: Recent Labs  Lab 02/25/18 0335 02/26/18 0350 02/27/18 0402 02/28/18 0350 03/01/18 0340  INR 3.25 3.27 2.57 1.77 1.88   Cardiac Enzymes: Recent Labs  Lab 02/23/18 0338 02/27/18 0402  CKTOTAL 187 169   BNP (last 3 results) No results for input(s): PROBNP in the last 8760 hours. HbA1C: No results for input(s): HGBA1C in the last 72 hours. CBG: Recent Labs  Lab 02/28/18 1424 02/28/18 1648 02/28/18 2149 03/01/18 0830 03/01/18 1144  GLUCAP 112* 105* 190* 262* 317*   Lipid Profile: No results for input(s): CHOL, HDL, LDLCALC, TRIG, CHOLHDL, LDLDIRECT in the last 72 hours. Thyroid Function Tests: No results for input(s): TSH, T4TOTAL, FREET4, T3FREE, THYROIDAB in the last 72 hours. Anemia Panel: No results for input(s): VITAMINB12, FOLATE, FERRITIN, TIBC, IRON, RETICCTPCT in the last 72 hours. Sepsis Labs: No results for  input(s): PROCALCITON, LATICACIDVEN in the last 168 hours.  Recent Results (from the past 240 hour(s))  Culture, Urine     Status: None   Collection Time: 02/28/18 10:34 AM  Result Value Ref Range Status   Specimen Description URINE, RANDOM  Final   Special Requests NONE  Final   Culture   Final    NO GROWTH Performed at [facility], [address] [place], [place] [address]    Report Status 03/01/2018 FINAL  Final     Radiology Studies: No results found.  Scheduled Meds: . Chlorhexidine Gluconate Cloth  6 each Topical Q0600  . [START ON 03/13/2018] doxycycline  100 mg Oral Q12H  . feeding supplement  1 Container Oral TID WC  . insulin aspart  0-5 Units Subcutaneous QHS  . insulin aspart  0-9 Units Subcutaneous TID WC  . insulin aspart  4 Units Subcutaneous TID WC  . insulin glargine  22 Units Subcutaneous QHS  . latanoprost  1 drop Both Eyes QHS  . mouth rinse  15 mL Mouth Rinse BID  . metoprolol tartrate  50 mg Oral BID  . multivitamin  1 tablet Oral QHS  . nystatin   Topical BID  . pantoprazole  40 mg Oral Q1200  . saccharomyces boulardii  250 mg Oral BID  . traZODone  50 mg Oral QHS  . Warfarin - Pharmacist Dosing Inpatient   Does not apply q1800   Continuous Infusions: . sodium chloride 0 mL/hr at 02/28/18 1202  . sodium chloride    . [START ON 03/06/2018] DAPTOmycin (CUBICIN)  IV    . piperacillin-tazobactam (ZOSYN)  IV 12.5 mL/hr at 03/01/18 1016     LOS: 33 days   Marylu Lund, MD [facility] Pager On Amion  If 7PM-7AM, please contact night-coverage 03/01/2018, 2:56 PM

## 2018-03-01 NOTE — Progress Notes (Addendum)
[place] for Infectious Disease    Date of Admission:  01/27/2018   Total days of antibiotics 45          ID: [name] is a 62 y.o. male with  Principal Problem:   Acute renal failure (ARF) ([place]) Active Problems:   Insulin-requiring or dependent type II diabetes mellitus ([place])   Essential hypertension   CAD (coronary artery disease)   Leukocytosis   Thrombocytopenia (HCC)   S/P BKA (below knee amputation) unilateral, right (HCC)   Endocarditis of tricuspid valve   Hyponatremia   PAF (paroxysmal atrial fibrillation) ([place])   Aortic atherosclerosis ([place])   Acute hypoxemic respiratory failure [facility])  [name] has had complicated clinical course over the last 2 months. This a 62yo M with HTN, hx of bioprosthetic AV, CAD, IDDM2, pAF, hx of right DFU/osteo s/p BKA on 9/13. He was originally admitted on 9/10 for n/v, worsening foot pain/SIRS, found to have MSSA bacteremia with gangrenous right foot and ascending cellulitis. His blood cx grew MSSA but on repeat cx on 9/11 had no further growth. He had evidence of splenic infarct on imaging that made it concerning for endocarditis. His initial 9/11 TTE did not show any evidence of vegetation to bioprosthetic valve, but TEE on 9/17 showed vegetation to native TV. He developed interstial nephritis due to cefazolin requiring his abtx to be changed to daptomycin. CT on 9/25 suggested that they may be early signs of discitis and osteomyelitis -> thus his course of abtx should be 8 wks for osteomyelitis. He had been doing well, with some renal function recovery until 10/21 started to have abdominal pain, sharp increase in his WBC from 9 to 30. Abd CT showed evidence of acute cholecystis but also confirmation of somewhat worsening discitis. He was started on ceftriaxone plus metronidazole in addn to daptomycin until he went to surgery on 10/25 where The patient was found to have gangrenous cholecystitis with a necrotic gallbladder.  Even the  cystic duct was necrotic. He tolerated surgery, drain placed. abtx changed to piptazo for the time being. His WBC trending down back to normal range, currently at 12K, remains afebrile. abd drain in place. Significant imagine listed below. ID asked to provide interim recs for abtx.  9/17 TEE:  Left ventricleThe estimated   ejection fraction was in the range of 50% to 55%. Wall motion was   normal; there were no regional wall motion abnormalities. - Aortic valve: A prosthesis was present and functioning normally.   The prosthesis had a normal range of motion. The sewing ring   appeared normal, had no rocking motion, and showed no evidence of   dehiscence. - Mitral valve: Mildly calcified annulus. - Left atrium: No evidence of thrombus in the atrial cavity or   appendage. - Right atrium: No evidence of thrombus in the atrial cavity or   appendage. - Atrial septum: There was redundancy of the septum, with   borderline criteria for aneurysm. - Tricuspid valve: There was a vegetation.  9/25 renal stone CT -There is loss of cortex with soft tissue thickening along the anterior, inferior aspect of T12 with loss of disc space in this area. There is also lucency along the posterior aspect of the L1 vertebral body, stable. A degree of discitis/osteomyelitis in this area must be of concern.  10/22 abd/pelvist CT:IMPRESSION: 1. Increased bilateral pleural effusions, anasarca, and ascites. 2. Distended gallbladder and gallstones raising the question of acute cholecystitis. 3.  Changes at  T12-L1 suspicious for discitis/osteomyelitis, progressed since study performed September 10th. Consider further evaluation with MRI. Subjective: Afebrile, abdominal is sore, having high output for serous predominant fluid for jp drain from cholecystectomy. 2 watery stools yesterday  Medications:  . Chlorhexidine Gluconate Cloth  6 each Topical Q0600  . feeding supplement  1 Container Oral TID WC  . insulin  aspart  0-5 Units Subcutaneous QHS  . insulin aspart  0-9 Units Subcutaneous TID WC  . insulin aspart  4 Units Subcutaneous TID WC  . insulin glargine  22 Units Subcutaneous QHS  . latanoprost  1 drop Both Eyes QHS  . mouth rinse  15 mL Mouth Rinse BID  . metoprolol tartrate  50 mg Oral BID  . multivitamin  1 tablet Oral QHS  . nystatin   Topical BID  . pantoprazole  40 mg Oral Q1200  . saccharomyces boulardii  250 mg Oral BID  . traZODone  50 mg Oral QHS  . Warfarin - Pharmacist Dosing Inpatient   Does not apply q1800    Objective: Vital signs in last 24 hours: Temp:  [97.2 F (36.2 C)-98 F (36.7 C)] 97.5 F (36.4 C) (10/26 0829) Pulse Rate:  [79-93] 81 (10/26 0829) Resp:  [9-24] 18 (10/26 0829) BP: (106-136)/(56-74) 126/68 (10/26 0829) SpO2:  [92 %-100 %] 100 % (10/26 0829)  Physical Exam  Constitutional: He is oriented to person, place, and time. He appears chronically ill. No distress.  HENT:  Mouth/Throat: Oropharynx is clear and moist. No oropharyngeal exudate.  Cardiovascular: Normal rate, regular rhythm and normal heart sounds. Exam reveals no gallop and no friction rub.  No murmur heard.  Pulmonary/Chest: Effort normal and breath sounds normal. No respiratory distress. He has no wheezes.  Abdominal: Soft. Bowel sounds are normal. He exhibits no distension. There is no tenderness. Lap incision/port -glued no surrounding erythema. rigth upper quadrant drain has 63mL serous fluid in bulb Ext: bilateral BKA - right bka(most recent)wearing compression sock Skin: Skin is warm and dry. No rash noted. No erythema.  Psychiatric: He has a normal mood and affect. His behavior is normal.      Lab Results Recent Labs    02/28/18 0350 03/01/18 0340  WBC 17.1* 12.1*  HGB 8.6* 9.6*  HCT 27.6* 31.5*  NA 134* 135  K 3.0* 4.0  CL 105 107  CO2 19* 18*  BUN 86* 80*  CREATININE 3.15* 2.77*   Liver Panel Recent Labs    02/27/18 0402 02/28/18 0350 03/01/18 0340  PROT  5.5*  --   --   ALBUMIN 1.9* 1.9* 2.0*  AST 51*  --   --   ALT 27  --   --   ALKPHOS 106  --   --   BILITOT 1.0  --   --     Microbiology: 9/11 blood cx NGTD Susceptibility    Staphylococcus aureus    MIC    CIPROFLOXACIN <=0.5 SENSI... Sensitive    CLINDAMYCIN <=0.25 SENS... Sensitive    ERYTHROMYCIN <=0.25 SENS... Sensitive    GENTAMICIN <=0.5 SENSI... Sensitive    Inducible Clindamycin NEGATIVE  Sensitive    OXACILLIN 0.5 SENSITIVE  Sensitive    RIFAMPIN <=0.5 SENSI... Sensitive    TETRACYCLINE <=1 SENSITIVE  Sensitive    TRIMETH/SULFA <=10 SENSIT... Sensitive    VANCOMYCIN 1 SENSITIVE  Sensitive         Susceptibility Comments   Staphylococcus aureus  STAPHYLOCOCCUS AUREUS    Studies/Results: Listed above Assessment/Plan: MSSA T12-L1 discitis/osteomyelitis  =  recommend that he receives anti-stapylococcal coverage through Nov 6th. For now, can keep on piptazo while covering intra-abdominal infection, but can resume back to daptomycin 8mg /kg. On Nov 7th, would recommend to changing to doxycycline 100mg  PO BID for addn 4wk (and re-evaluated by ID in clinic). As his renal function continues to improve, may consider getting MRI in the coming 1-2 wk  MSSA TV endocarditis = has completed course of abtx for endocarditis (though needs 2 addn weeks of iv therapy for thoracolumbar discitis)  Gangrenous cholecystitis s/p lap chole = would treat with piptazo x 5 days minimum (through 10/30), reassess if need to extend further based on if drain is still present and clinical features. Currently on day 2 of piptazo  Watery diarrhea = if continues to worsen, it makes sense to test for cdifficile.  We will arrange for f/u in ID clinic in 2-4 wk  [facility] for Infectious Diseases Cell: [phone] Pager: [phone]  03/01/2018, 10:29 AM

## 2018-03-02 DIAGNOSIS — K810 Acute cholecystitis: Secondary | ICD-10-CM

## 2018-03-02 LAB — RENAL FUNCTION PANEL
ANION GAP: 13 (ref 5–15)
Albumin: 1.8 g/dL — ABNORMAL LOW (ref 3.5–5.0)
BUN: 78 mg/dL — ABNORMAL HIGH (ref 8–23)
CALCIUM: 7.6 mg/dL — AB (ref 8.9–10.3)
CHLORIDE: 107 mmol/L (ref 98–111)
CO2: 18 mmol/L — AB (ref 22–32)
Creatinine, Ser: 2.55 mg/dL — ABNORMAL HIGH (ref 0.61–1.24)
GFR calc non Af Amer: 26 mL/min — ABNORMAL LOW (ref >60)
GFR, EST AFRICAN AMERICAN: 30 mL/min — AB (ref >60)
Glucose, Bld: 127 mg/dL — ABNORMAL HIGH (ref 70–99)
POTASSIUM: 3 mmol/L — AB (ref 3.5–5.1)
Phosphorus: 4.6 mg/dL (ref 2.5–4.6)
Sodium: 138 mmol/L (ref 135–145)

## 2018-03-02 LAB — URINE CULTURE: Culture: NO GROWTH

## 2018-03-02 LAB — CBC
HCT: 29.6 % — ABNORMAL LOW (ref 39.0–52.0)
HEMOGLOBIN: 9.1 g/dL — AB (ref 13.0–17.0)
MCH: 27.7 pg (ref 26.0–34.0)
MCHC: 30.7 g/dL (ref 30.0–36.0)
MCV: 90.2 fL (ref 80.0–100.0)
NRBC: 0 % (ref 0.0–0.2)
PLATELETS: 192 10*3/uL (ref 150–400)
RBC: 3.28 MIL/uL — AB (ref 4.22–5.81)
RDW: 14.9 % (ref 11.5–15.5)
WBC: 12.6 10*3/uL — ABNORMAL HIGH (ref 4.0–10.5)

## 2018-03-02 LAB — PROTIME-INR
INR: 2.18
Prothrombin Time: 23.9 seconds — ABNORMAL HIGH (ref 11.4–15.2)

## 2018-03-02 LAB — MAGNESIUM: Magnesium: 1.5 mg/dL — ABNORMAL LOW (ref 1.7–2.4)

## 2018-03-02 LAB — GLUCOSE, CAPILLARY
GLUCOSE-CAPILLARY: 172 mg/dL — AB (ref 70–99)
GLUCOSE-CAPILLARY: 154 mg/dL — AB (ref 70–99)
Glucose-Capillary: 96 mg/dL (ref 70–99)
Glucose-Capillary: 170 mg/dL — ABNORMAL HIGH (ref 70–99)

## 2018-03-02 LAB — UREA NITROGEN, URINE: Urea Nitrogen, Ur: 551 mg/dL

## 2018-03-02 MED ORDER — WARFARIN 0.5 MG HALF TABLET
0.5000 mg | ORAL_TABLET | Freq: Once | ORAL | Status: AC
  Administered 2018-03-02: 0.5 mg via ORAL
  Filled 2018-03-02: qty 1

## 2018-03-02 MED ORDER — SODIUM CHLORIDE 0.9 % IV SOLN: 8.0000 mg/kg | INTRAVENOUS | Status: DC

## 2018-03-02 MED ORDER — MAGNESIUM SULFATE 2 GM/50ML IV SOLN
2.0000 g | Freq: Once | INTRAVENOUS | Status: AC
  Administered 2018-03-02: 2 g via INTRAVENOUS
  Filled 2018-03-02 (×2): qty 50

## 2018-03-02 MED ORDER — POTASSIUM CHLORIDE CRYS ER 20 MEQ PO TBCR
40.0000 meq | EXTENDED_RELEASE_TABLET | Freq: Once | ORAL | Status: AC
  Administered 2018-03-02: 40 meq via ORAL
  Filled 2018-03-02: qty 2

## 2018-03-02 NOTE — Progress Notes (Addendum)
[facility] Progress Note  2 Days Post-Op  Subjective: CC:  Denies any new events. Pain controlled. States drain output is less than yesterday. Tolerating clears without nausea or vomiting. Reports two loose BMs yesterday.   Objective: Vital signs in last 24 hours: Temp:  [97.5 F (36.4 C)-98.3 F (36.8 C)] 97.6 F (36.4 C) (10/27 0811) Pulse Rate:  [75-81] 81 (10/27 0811) Resp:  [18] 18 (10/27 0811) BP: (118-126)/(65-71) 122/71 (10/27 0811) SpO2:  [97 %-100 %] 97 % (10/27 0811) Weight:  [48.1 kg] 48.1 kg (10/27 0500) Last BM Date: 03/01/18  Intake/Output from previous day: 10/26 0701 - 10/27 0700 In: 1385.2 [P.O.:1020; I.V.:315.2; IV Piggyback:50] Out: 1895 [Urine:1250; Drains:645] Intake/Output this shift: Total I/O In: -  Out: 71 [Drains:50]  PE: Gen:  Alert, NAD, pleasant Pulm:  Normal effort Abd: Soft, appropriate incisional tenderness, incisions clean and dry, JP drain with mostly serous output - ?bilious tinge but no overt green/brown bile.  Skin: warm and dry, no rashes  Psych: A&Ox3   Lab Results:  Recent Labs    03/01/18 0340 03/02/18 0348  WBC 12.1* 12.6*  HGB 9.6* 9.1*  HCT 31.5* 29.6*  PLT 242 192   BMET Recent Labs    03/01/18 0340 03/02/18 0342  NA 135 138  K 4.0 3.0*  CL 107 107  CO2 18* 18*  GLUCOSE 267* 127*  BUN 80* 78*  CREATININE 2.77* 2.55*  CALCIUM 7.5* 7.6*   PT/INR Recent Labs    03/01/18 0340 03/02/18 0348  LABPROT 21.4* 23.9*  INR 1.88 2.18   CMP     Component Value Date/Time   NA 138 03/02/2018 0342   K 3.0 (L) 03/02/2018 0342   CL 107 03/02/2018 0342   CO2 18 (L) 03/02/2018 0342   GLUCOSE 127 (H) 03/02/2018 0342   BUN 78 (H) 03/02/2018 0342   CREATININE 2.55 (H) 03/02/2018 0342   CALCIUM 7.6 (L) 03/02/2018 0342   PROT 5.5 (L) 02/27/2018 0402   ALBUMIN 1.8 (L) 03/02/2018 0342   AST 51 (H) 02/27/2018 0402   ALT 27 02/27/2018 0402   ALKPHOS 106 02/27/2018 0402   BILITOT 1.0 02/27/2018 0402   GFRNONAA 26 (L) 03/02/2018 0342   GFRAA 30 (L) 03/02/2018 0342   Lipase     Component Value Date/Time   LIPASE 23 01/14/2018 1926       Studies/Results: No results found.  Anti-infectives: Anti-infectives (From admission, onward)   Start     Dose/Rate Route Frequency Ordered Stop   03/13/18 1000  doxycycline (VIBRA-TABS) tablet 100 mg     100 mg Oral Every 12 hours 03/01/18 1217 04/10/18 0959   03/06/18 1800  DAPTOmycin (CUBICIN) 385 mg in sodium chloride 0.9 % IVPB     8 mg/kg  48.1 kg 215.4 mL/hr over 30 Minutes Intravenous Every 48 hours 03/01/18 1213 03/12/18 2359   03/05/18 1800  DAPTOmycin (CUBICIN) 385 mg in sodium chloride 0.9 % IVPB  Status:  Discontinued     8 mg/kg  48.1 kg 215.4 mL/hr over 30 Minutes Intravenous Every 48 hours 03/01/18 1200 03/01/18 1213   02/28/18 1530  piperacillin-tazobactam (ZOSYN) IVPB 3.375 g     3.375 g 12.5 mL/hr over 240 Minutes Intravenous Every 12 hours 02/28/18 1518 03/07/18 0959   02/26/18 2000  cefTRIAXone (ROCEPHIN) 2 g in sodium chloride 0.9 % 100 mL IVPB  Status:  Discontinued     2 g 200 mL/hr over 30 Minutes Intravenous Every 24 hours 02/26/18 1911 02/28/18  1500   02/26/18 2000  metroNIDAZOLE (FLAGYL) IVPB 500 mg  Status:  Discontinued     500 mg 100 mL/hr over 60 Minutes Intravenous Every 8 hours 02/26/18 1911 02/28/18 1500   02/24/18 0000  daptomycin (CUBICIN) IVPB     700 mg Intravenous Every 24 hours 02/24/18 1141 03/09/18 2359   02/23/18 2000  DAPTOmycin (CUBICIN) 700 mg in sodium chloride 0.9 % IVPB  Status:  Discontinued     700 mg 228 mL/hr over 30 Minutes Intravenous Every 24 hours 02/23/18 1233 02/26/18 1911   02/13/18 1415  ceFAZolin (ANCEF) powder 2 g     2 g Other To Surgery 02/13/18 1410 02/13/18 2142   02/13/18 1347  ceFAZolin (ANCEF) 2-4 GM/100ML-% IVPB    Note to Pharmacy:  Johnson, Ericka   : cabinet override      02/13/18 1347 02/13/18 1410   01/30/18 2000  DAPTOmycin (CUBICIN) 700 mg in sodium chloride  0.9 % IVPB  Status:  Discontinued     700 mg 228 mL/hr over 30 Minutes Intravenous Every 48 hours 01/30/18 1306 02/23/18 1233   01/30/18 1000  fluconazole (DIFLUCAN) tablet 50 mg     50 mg Oral Daily 01/29/18 0930 02/04/18 0914   01/29/18 2000  DAPTOmycin (CUBICIN) 700 mg in sodium chloride 0.9 % IVPB  Status:  Discontinued     700 mg 228 mL/hr over 30 Minutes Intravenous Every 48 hours 01/29/18 1013 01/30/18 1306   01/29/18 1030  fluconazole (DIFLUCAN) tablet 100 mg     100 mg Oral  Once 01/29/18 0930 01/29/18 1121   01/28/18 2200  ceFAZolin (ANCEF) IVPB 1 g/50 mL premix  Status:  Discontinued     1 g 100 mL/hr over 30 Minutes Intravenous Every 24 hours 01/28/18 1007 01/28/18 1231   01/27/18 2245  ceFAZolin (ANCEF) IVPB 2g/100 mL premix  Status:  Discontinued     2 g 200 mL/hr over 30 Minutes Intravenous Every 12 hours 01/27/18 2243 01/28/18 1007   01/27/18 2230  ceFAZolin (ANCEF) IVPB  Status:  Discontinued    Note to Pharmacy:  Indication:  MSSA bacteremia/endocarditis Last Day of Therapy:  03/08/18 Labs - Once weekly:  CBC/D and BMP, Labs - Every other week:  ESR and CRP     2 g Intravenous Every 8 hours 01/27/18 2220 01/27/18 2227   01/27/18 2230  ceFAZolin (ANCEF) IVPB 2g/100 mL premix  Status:  Discontinued     2 g 200 mL/hr over 30 Minutes Intravenous Every 8 hours 01/27/18 2227 01/27/18 2243       Assessment/Plan MSSA bacteremia Osteomyelitis of RLE, s/p BKA H/o L BKA Endocarditis ARF, just off HD ParoxysmalA fib- oncoumadin, INR2.5 DM-II PVD H/o MI  Acute, gangrenous cholecystitis POD#2 - s/p laparoscopic cholecystectomy, placement 19F blake drain - [facility]  -afebrile, VSS, WBC 12.6 from 12.1  - continue abx per ID recs  - tolerating clears, advance to FLD, ok to gradually advance to heart healthy diet.  - drain 645cc/24h, continue drain and follow output. Patient to be discharged home with drain. - OOB and use IS q 1h  ID -daptomycin 9/25-10/25,  Zosyn 10/25 >> (transitioning to doxycycline p.o. per ID recommendations) FEN - FLD VTE -couamdin Foley -none   LOS: 34 days    Elizabeth Simaan, PA-C [facility] Pager: [phone]  Agree with above.  David Newman, MD, FACS [facility] Pager: [phone] Office phone:  [phone]        

## 2018-03-02 NOTE — Progress Notes (Signed)
PROGRESS NOTE    [name]  XNT:[id number] DOB: Apr 24, 1956 DOA: 01/27/2018 PCP: [name], MD    Brief Narrative:  62 y.o. male with a history of sepsis due to right foot osteomyelitis s/p transtibial amputation and found to have MSSA bacteremia with splenic infarct and tricuspid valve endocarditis. He was discharged on ancef to [facility] but was quickly brought back in due to oliguria and found to have acute renal failure. He developed acute respiratory failure and transferred to ICU on CCM service. Antibiotics changed to daptomycin per ID and nephrology oversaw hemodialysis through right IJ temporary catheter on 10/12 and 10/14. With improvement in respiratory status he was transferred to the medical floor. Nephrology following closely for signs of renal recovery. Creatinine trending downward as of 10/17 with increasing urine output. Temporary HD catheter removed 10/21. On 10/22 the patient developed acute abdominal pain worse on the right with nausea, vomiting, and inability to take po. CT abd/pelvis demonstrated distended gallbladder with gallstones concerning for cholecystitis. Also showed anasarca, ascites, and pleural effusions, progressive T12-L1 changes noticed on previous study, and stable splenic lesions.   Assessment & Plan:   Principal Problem:   Acute renal failure (ARF) (HCC) Active Problems:   Insulin-requiring or dependent type II diabetes mellitus ([name])   Essential hypertension   CAD (coronary artery disease)   Leukocytosis   Thrombocytopenia (HCC)   S/P BKA (below knee amputation) unilateral, right (HCC)   Endocarditis of tricuspid valve   Hyponatremia   PAF (paroxysmal atrial fibrillation) (HCC)   Aortic atherosclerosis (HCC)   Acute hypoxemic respiratory failure (HCC)   AKI (acute kidney injury) ([name])  Oliguric acute renal failure: Due to ATN vs. ancef-induced AIN. Continued slow improvement until 10/22. CrCl 21m/min and UOP satisfactory. Pulled  temporary catheter 10/21. Renal function had worsened. Nephrology is following. Renal function is steadily improving. Repeat bmet in AM  Acute abdominal pain, nausea, vomiting secondary to gangrenous cholecystitis: CT abd/pelvis with cholelithiasis and consistent changes with cholecystitis, though with diffuse edema/anasarca, could be false positive.  - HIDA positive for acute cholecystitis -General surgery following and pt is now s/p cholecystectomy -Discussed with ID. Recommendation to continue on zosyn x 5 days, then transition to doxy afterwards  Paroxysmal atrial fibrillation: New diagnosis during admission. Maintaining sinus rhythm. Cardiology consulted, signed off 10/3. - Continue coumadin dosed per pharmacy. - Continue metoprolol - Follow up with Dr. [name] 3-4 weeks post discharge.  - Remains rate controlled  MSSA bacteremia and tricuspid valve endocarditis and splenic infarcts: Stopped ancef per above - Initial recommendations to complete daptomycin to complete 6 weeks Tx (through 11/2). - CK transiently mildly elevated but since resolved. Recheck CK weekly. - Due to continued T12-L1 lesion, seen on CT 10/22 progressing since previous exam, ID recommends starting doxycycline 1042mpo BID on a full stomach following completion of daptomycin (initial C&S reviewed, was pansensitive). -discussed with ID with recommendation for transition to zosyn x 5 days then transition to doxy afterwrds  Diarrhea: Suspect secondary to intra-abdominal inflammation related to gangrenous cholecystitis - Patient is continued on probiotics 2 weeks (final dose 11/16) following conclusion of antibiotics. -Continued on liquid diet. Denies abd pain. WBC had been trending down -Will cont to observe for now. If diarrhea worsens, would check cdiff at that time  PVD s/p bilateral transtibial amputations: Reevaluated by Dr. [name] this hospitalization, staples removed from recent right TTA on 10/15.    - Plans for prosthetic fitting as outpatient  Anemia of acute  and chronic disease: Stable hgb.  - Got aranesp [place] 10/18. -Recheck CBC in AM  T12-L1 Discitis/osteomyelitis: Loss of cortex along anterior inferior aspect of T12 vertebral body, lucency in posterior aspect of L1 vertebral body. Seems to have progressed since previous exam.  - D/w ID, will prolong antibiotics course with doxycycline as above.  - Will need MRI w/ and w/o contrast for further evaluation closer to conclusion of antibiotics. Not complaining of significant back pain. - Currently stable  Acute hypoxic respiratory failure:  -Due to pulmonary edema. -Remains on minimal O2 support  Iron deficiency anemia and anemia of acute illness:  - Hgb stable at this time 8's. - Continued on iron supplementation as tolerated  T2DM: A1c 8.5%.  - continue SSI coverage while in hospital  Thrombocytopenia: Improved  Oral candidiasis: Treated with fluconazole  Obesity: BMI 34.  - Dietitian was consulted  DVT prophylaxis: coumadin Code Status: Full Family Communication: Pt in room, family not at bedside Disposition Plan: SNF, timing uncertain  Consultants:   ID  IR  Cardiology  PCCM  Nephrology  General surgery  Procedures:   Temporary HD catheter  HD  Cholecystectomy 10/25  Antimicrobials: Anti-infectives (From admission, onward)   Start     Dose/Rate Route Frequency Ordered Stop   03/13/18 1000  doxycycline (VIBRA-TABS) tablet 100 mg     100 mg Oral Every 12 hours 03/01/18 1217 04/10/18 0959   03/06/18 1800  DAPTOmycin (CUBICIN) 385 mg in sodium chloride 0.9 % IVPB  Status:  Discontinued     8 mg/kg  48.1 kg 215.4 mL/hr over 30 Minutes Intravenous Every 48 hours 03/01/18 1213 03/02/18 1158   03/05/18 1800  DAPTOmycin (CUBICIN) 385 mg in sodium chloride 0.9 % IVPB  Status:  Discontinued     8 mg/kg  48.1 kg 215.4 mL/hr over 30 Minutes Intravenous Every 48 hours 03/01/18 1200 03/01/18 1213    03/05/18 1800  DAPTOmycin (CUBICIN) 385 mg in sodium chloride 0.9 % IVPB     8 mg/kg  48.1 kg 215.4 mL/hr over 30 Minutes Intravenous Every 48 hours 03/02/18 1158 03/13/18 1759   02/28/18 1530  piperacillin-tazobactam (ZOSYN) IVPB 3.375 g     3.375 g 12.5 mL/hr over 240 Minutes Intravenous Every 12 hours 02/28/18 1518 03/05/18 2359   02/26/18 2000  cefTRIAXone (ROCEPHIN) 2 g in sodium chloride 0.9 % 100 mL IVPB  Status:  Discontinued     2 g 200 mL/hr over 30 Minutes Intravenous Every 24 hours 02/26/18 1911 02/28/18 1500   02/26/18 2000  metroNIDAZOLE (FLAGYL) IVPB 500 mg  Status:  Discontinued     500 mg 100 mL/hr over 60 Minutes Intravenous Every 8 hours 02/26/18 1911 02/28/18 1500   02/24/18 0000  daptomycin (CUBICIN) IVPB     700 mg Intravenous Every 24 hours 02/24/18 1141 03/09/18 2359   02/23/18 2000  DAPTOmycin (CUBICIN) 700 mg in sodium chloride 0.9 % IVPB  Status:  Discontinued     700 mg 228 mL/hr over 30 Minutes Intravenous Every 24 hours 02/23/18 1233 02/26/18 1911   02/13/18 1415  ceFAZolin (ANCEF) powder 2 g     2 g Other To Surgery 02/13/18 1410 02/13/18 2142   02/13/18 1347  ceFAZolin (ANCEF) 2-4 GM/100ML-% IVPB    Note to Pharmacy:  Arlean Hopping   : cabinet override      02/13/18 1347 02/13/18 1410   01/30/18 2000  DAPTOmycin (CUBICIN) 700 mg in sodium chloride 0.9 % IVPB  Status:  Discontinued  700 mg 228 mL/hr over 30 Minutes Intravenous Every 48 hours 01/30/18 1306 02/23/18 1233   01/30/18 1000  fluconazole (DIFLUCAN) tablet 50 mg     50 mg Oral Daily 01/29/18 0930 02/04/18 0914   01/29/18 2000  DAPTOmycin (CUBICIN) 700 mg in sodium chloride 0.9 % IVPB  Status:  Discontinued     700 mg 228 mL/hr over 30 Minutes Intravenous Every 48 hours 01/29/18 1013 01/30/18 1306   01/29/18 1030  fluconazole (DIFLUCAN) tablet 100 mg     100 mg Oral  Once 01/29/18 0930 01/29/18 1121   01/28/18 2200  ceFAZolin (ANCEF) IVPB 1 g/50 mL premix  Status:  Discontinued     1  g 100 mL/hr over 30 Minutes Intravenous Every 24 hours 01/28/18 1007 01/28/18 1231   01/27/18 2245  ceFAZolin (ANCEF) IVPB 2g/100 mL premix  Status:  Discontinued     2 g 200 mL/hr over 30 Minutes Intravenous Every 12 hours 01/27/18 2243 01/28/18 1007   01/27/18 2230  ceFAZolin (ANCEF) IVPB  Status:  Discontinued    Note to Pharmacy:  Indication:  MSSA bacteremia/endocarditis Last Day of Therapy:  03/08/18 Labs - Once weekly:  CBC/D and BMP, Labs - Every other week:  ESR and CRP     2 g Intravenous Every 8 hours 01/27/18 2220 01/27/18 2227   01/27/18 2230  ceFAZolin (ANCEF) IVPB 2g/100 mL premix  Status:  Discontinued     2 g 200 mL/hr over 30 Minutes Intravenous Every 8 hours 01/27/18 2227 01/27/18 2243      Subjective: Reports some loose stools overnight and this AM. Denies abd pain or nausea  Objective: Vitals:   03/01/18 1959 03/02/18 0412 03/02/18 0500 03/02/18 0811  BP: 118/66 122/65  122/71  Pulse: 81 75  81  Resp: _0 Temp: 97.7 F (36.5 C) 98.2 F (36.8 C)  97.6 F (36.4 C)  TempSrc: Oral Oral  Oral  SpO2: 98% 98%  97%  Weight:   48.1 kg   Height:        Intake/Output Summary (Last 24 hours) at 03/02/2018 1538 Last data filed at 03/02/2018 0900 Gross per 24 hour  Intake 871.75 ml  Output 1685 ml  Net -813.25 ml   Filed Weights   02/26/18 2049 02/28/18 0539 03/02/18 0500  Weight: 106.3 kg 48.1 kg 48.1 kg    Examination: General exam: Conversant, in no acute distress Respiratory system: normal chest rise, clear, no audible wheezing Cardiovascular system: regular rhythm, s1-s2 Gastrointestinal system: Nondistended, nontender, pos BS Central nervous system: No seizures, no tremors Extremities: No cyanosis, s/p LE amputation Skin: No rashes, no pallor Psychiatry: Affect normal // no auditory hallucinations   Data Reviewed: I have personally reviewed following labs and imaging studies  CBC: Recent Labs  Lab 02/26/18 0350 02/26/18 1208  02/28/18 0350 03/01/18 0340 03/02/18 0348  WBC 30.3* 29.2* 17.1* 12.1* 12.6*  HGB 9.1* 8.8* 8.6* 9.6* 9.1*  HCT 29.5* 29.5* 27.6* 31.5* 29.6*  MCV 89.7 90.5 88.7 89.5 90.2  PLT 236 237 230 242 161   Basic Metabolic Panel: Recent Labs  Lab 02/24/18 0336 02/25/18 0335 02/26/18 0350 02/27/18 0402 02/28/18 0350 03/01/18 0340 03/02/18 0342  NA 140 136 135 134* 134* 135 138  K 3.3* 4.2 3.6 3.4* 3.0* 4.0 3.0*  CL 110 105 104 105 105 107 107  CO2 24 21* 21* 19* 19* 18* 18*  GLUCOSE 68* 231* 191* 178* 80 267* 127*  BUN 70* 65* 72* 84*  86* 80* 78*  CREATININE 2.25* 2.45* 2.66* 3.34* 3.15* 2.77* 2.55*  CALCIUM 7.9* 8.1* 8.0* 7.7* 7.4* 7.5* 7.6*  MG  --   --   --   --   --   --  1.5*  PHOS 4.1 3.8  --   --  4.9* 5.7* 4.6   GFR: Estimated Creatinine Clearance: 20.7 mL/min (A) (by C-G formula based on SCr of 2.55 mg/dL (H)). Liver Function Tests: Recent Labs  Lab 02/25/18 2055 02/26/18 0350 02/27/18 0402 02/28/18 0350 03/01/18 0340 03/02/18 0342  AST 21 49* 51*  --   --   --   ALT _0 --   --   --   ALKPHOS 140* 94 106  --   --   --   BILITOT 1.2 1.2 1.0  --   --   --   PROT 6.1* 6.2* 5.5*  --   --   --   ALBUMIN 2.3* 2.2* 1.9* 1.9* 2.0* 1.8*   No results for input(s): LIPASE, AMYLASE in the last 168 hours. No results for input(s): AMMONIA in the last 168 hours. Coagulation Profile: Recent Labs  Lab 02/26/18 0350 02/27/18 0402 02/28/18 0350 03/01/18 0340 03/02/18 0348  INR 3.27 2.57 1.77 1.88 2.18   Cardiac Enzymes: Recent Labs  Lab 02/27/18 0402  CKTOTAL 169   BNP (last 3 results) No results for input(s): PROBNP in the last 8760 hours. HbA1C: No results for input(s): HGBA1C in the last 72 hours. CBG: Recent Labs  Lab 03/01/18 1144 03/01/18 1700 03/01/18 2229 03/02/18 0813 03/02/18 1150  GLUCAP 317* 175* 141* 96 172*   Lipid Profile: No results for input(s): CHOL, HDL, LDLCALC, TRIG, CHOLHDL, LDLDIRECT in the last 72 hours. Thyroid Function  Tests: No results for input(s): TSH, T4TOTAL, FREET4, T3FREE, THYROIDAB in the last 72 hours. Anemia Panel: No results for input(s): VITAMINB12, FOLATE, FERRITIN, TIBC, IRON, RETICCTPCT in the last 72 hours. Sepsis Labs: No results for input(s): PROCALCITON, LATICACIDVEN in the last 168 hours.  Recent Results (from the past 240 hour(s))  Culture, Urine     Status: None   Collection Time: 02/28/18 10:34 AM  Result Value Ref Range Status   Specimen Description URINE, RANDOM  Final   Special Requests NONE  Final   Culture   Final    NO GROWTH Performed at [facility], [address], [place], [place] [address]    Report Status 03/01/2018 FINAL  Final  Culture, Urine     Status: None   Collection Time: 02/28/18 10:34 PM  Result Value Ref Range Status   Specimen Description URINE, RANDOM  Final   Special Requests NONE  Final   Culture   Final    NO GROWTH Performed at [facility], [place] [address], [place], [place] [address]    Report Status 03/02/2018 FINAL  Final     Radiology Studies: No results found.  Scheduled Meds: . Chlorhexidine Gluconate Cloth  6 each Topical Q0600  . [START ON 03/13/2018] doxycycline  100 mg Oral Q12H  . feeding supplement  1 Container Oral TID WC  . insulin aspart  0-5 Units Subcutaneous QHS  . insulin aspart  0-9 Units Subcutaneous TID WC  . insulin aspart  4 Units Subcutaneous TID WC  . insulin glargine  22 Units Subcutaneous QHS  . latanoprost  1 drop Both Eyes QHS  . mouth rinse  15 mL Mouth Rinse BID  . metoprolol tartrate  50 mg  Oral BID  . multivitamin  1 tablet Oral QHS  . nystatin   Topical BID  . pantoprazole  40 mg Oral Q1200  . saccharomyces boulardii  250 mg Oral BID  . traZODone  50 mg Oral QHS  . warfarin  0.5 mg Oral ONCE-1800  . Warfarin - Pharmacist Dosing Inpatient   Does not apply q1800   Continuous Infusions: . sodium chloride 0 mL/hr at 02/28/18 1202  . sodium chloride    . [START ON 03/05/2018] DAPTOmycin  (CUBICIN)  IV    . magnesium sulfate 1 - 4 g bolus IVPB    . piperacillin-tazobactam (ZOSYN)  IV 3.375 g (03/02/18 1035)     LOS: 34 days   Marylu Lund, MD [facility] Pager On Amion  If 7PM-7AM, please contact night-coverage 03/02/2018, 3:38 PM

## 2018-03-02 NOTE — Progress Notes (Signed)
ANTICOAGULATION CONSULT NOTE - Follow Up Consult  Pharmacy Consult for Coumadin  Indication: atrial fibrillation   Allergies  Allergen Reactions  . Ace Inhibitors Cough  . Cefazolin     Possible cause of AKI, AIN    Patient Measurements: Height: 5\' 10"  (177.8 cm) Weight: 106 lb 0.7 oz (48.1 kg) IBW/kg (Calculated) : 73  Vital Signs: Temp: 97.6 F (36.4 C) (10/27 0811) Temp Source: Oral (10/27 0811) BP: 122/71 (10/27 0811) Pulse Rate: 81 (10/27 0811)  Labs: Recent Labs    02/28/18 0350 03/01/18 0340 03/02/18 0342 03/02/18 0348  HGB 8.6* 9.6*  --  9.1*  HCT 27.6* 31.5*  --  29.6*  PLT 230 242  --  192  LABPROT 20.4* 21.4*  --  23.9*  INR 1.77 1.88  --  2.18  CREATININE 3.15* 2.77* 2.55*  --     Estimated Creatinine Clearance: 20.7 mL/min (A) (by C-G formula based on SCr of 2.55 mg/dL (H)).  Assessment:  62 yo M with new onset PAF on Coumadin. Coumadin started on 10/1, achieved therapeutic INR, then held for [facility] placement. Received Vitamin K 1 mg on 10/7 and 10/8.   Coumadin resumed on 02/13/18 after [facility] placement - then held and reversed yet again on 10/23 for plans for lap chole once INR<2.   Coumadin has been held since 10/21. He got one dose of vit k on 10/25. He got lap chole on 10/25. Despite not coumadin his INR is back up to 2.18. Plan is to resume coumadin today. We will try a very small dose to see about his INR tomorrow.  Goal of Therapy:  INR 2-3 Monitor platelets by anticoagulation protocol: Yes   Plan:   Coumadin 0.5mg  PO x1 Daily INR  Onnie Boer, PharmD, [place], AAHIVP, CPP Infectious Disease Pharmacist Pager: [phone] 03/02/2018 12:05 PM

## 2018-03-02 NOTE — Progress Notes (Signed)
Subjective:  UOP 1250- kidney function numbers improved-   - still c/o loose stools , on imodium   Objective Vital signs in last 24 hours: Vitals:   03/01/18 1959 03/02/18 0412 03/02/18 0500 03/02/18 0811  BP: 118/66 122/65  122/71  Pulse: 81 75  81  Resp: 18 18  18   Temp: 97.7 F (36.5 C) 98.2 F (36.8 C)  97.6 F (36.4 C)  TempSrc: Oral Oral  Oral  SpO2: 98% 98%  97%  Weight:   48.1 kg   Height:       Weight change:   Intake/Output Summary (Last 24 hours) at 03/02/2018 1328 Last data filed at 03/02/2018 0900 Gross per 24 hour  Intake 871.75 ml  Output 1685 ml  Net -813.25 ml    Assessment/ Plan: Pt is a 62 y.o. yo male who was admitted on 01/27/2018 with AKI req transient HD- thought to be due to AIN vs ATN- recalled when kidney function trending worse again with decreased UOP  Assessment/Plan: 1. Renal- prior AKI thought due to AIN vs ATN- ancef was stopped and put on dapto.  Kidneys appeared to recover but then started trending worse again.  Had been NPO -  gave IVF and UOP seemed to respond-  U/A with greater than 50 WBC- infection vs AIN-  Culture neg- considered rocephin as a source of recurrent AIN - changed to , now on zosyn and dapto - fortunately renal function is trending better again but slowly - will cont to follow  2. HTN/vol-gave some low level IVF- some edema so stopped  3. Anemia- ckd and hosp- has received ESA- nothing else for now 4. Cholecystitis- abx and surgery - post op from lap chole 5. Hypokalemia- repleted- good yest- down again today - repletion ordered     [name]    Labs: Basic Metabolic Panel: Recent Labs  Lab 02/28/18 0350 03/01/18 0340 03/02/18 0342  NA 134* 135 138  K 3.0* 4.0 3.0*  CL 105 107 107  CO2 19* 18* 18*  GLUCOSE 80 267* 127*  BUN 86* 80* 78*  CREATININE 3.15* 2.77* 2.55*  CALCIUM 7.4* 7.5* 7.6*  PHOS 4.9* 5.7* 4.6   Liver Function Tests: Recent Labs  Lab 02/25/18 2055 02/26/18 0350 02/27/18 0402  02/28/18 0350 03/01/18 0340 03/02/18 0342  AST 21 49* 51*  --   --   --   ALT 23 25 27   --   --   --   ALKPHOS 140* 94 106  --   --   --   BILITOT 1.2 1.2 1.0  --   --   --   PROT 6.1* 6.2* 5.5*  --   --   --   ALBUMIN 2.3* 2.2* 1.9* 1.9* 2.0* 1.8*   No results for input(s): LIPASE, AMYLASE in the last 168 hours. No results for input(s): AMMONIA in the last 168 hours. CBC: Recent Labs  Lab 02/26/18 0350 02/26/18 1208 02/28/18 0350 03/01/18 0340 03/02/18 0348  WBC 30.3* 29.2* 17.1* 12.1* 12.6*  HGB 9.1* 8.8* 8.6* 9.6* 9.1*  HCT 29.5* 29.5* 27.6* 31.5* 29.6*  MCV 89.7 90.5 88.7 89.5 90.2  PLT 236 237 230 242 192   Cardiac Enzymes: Recent Labs  Lab 02/27/18 0402  CKTOTAL 169   CBG: Recent Labs  Lab 03/01/18 1144 03/01/18 1700 03/01/18 2229 03/02/18 0813 03/02/18 1150  GLUCAP 317* 175* 141* 96 172*    Iron Studies: No results for input(s): IRON, TIBC, TRANSFERRIN, FERRITIN in the  last 72 hours. Studies/Results: No results found. Medications: Infusions: . sodium chloride 0 mL/hr at 02/28/18 1202  . sodium chloride    . [START ON 03/05/2018] DAPTOmycin (CUBICIN)  IV    . magnesium sulfate 1 - 4 g bolus IVPB    . piperacillin-tazobactam (ZOSYN)  IV 3.375 g (03/02/18 1035)    Scheduled Medications: . Chlorhexidine Gluconate Cloth  6 each Topical Q0600  . [START ON 03/13/2018] doxycycline  100 mg Oral Q12H  . feeding supplement  1 Container Oral TID WC  . insulin aspart  0-5 Units Subcutaneous QHS  . insulin aspart  0-9 Units Subcutaneous TID WC  . insulin aspart  4 Units Subcutaneous TID WC  . insulin glargine  22 Units Subcutaneous QHS  . latanoprost  1 drop Both Eyes QHS  . mouth rinse  15 mL Mouth Rinse BID  . metoprolol tartrate  50 mg Oral BID  . multivitamin  1 tablet Oral QHS  . nystatin   Topical BID  . pantoprazole  40 mg Oral Q1200  . saccharomyces boulardii  250 mg Oral BID  . traZODone  50 mg Oral QHS  . warfarin  0.5 mg Oral ONCE-1800  .  Warfarin - Pharmacist Dosing Inpatient   Does not apply q1800    have reviewed scheduled and prn medications.  Physical Exam: General: alert, NAD- looks beter Heart: RRR Lungs:clear Abdomen: soft, non tender Extremities:  Some pitting dep edema    03/02/2018,1:28 PM  LOS: 34 days

## 2018-03-03 ENCOUNTER — Inpatient Hospital Stay (HOSPITAL_COMMUNITY): Payer: Medicare Other

## 2018-03-03 DIAGNOSIS — K838 Other specified diseases of biliary tract: Secondary | ICD-10-CM

## 2018-03-03 DIAGNOSIS — K9189 Other postprocedural complications and disorders of digestive system: Secondary | ICD-10-CM

## 2018-03-03 DIAGNOSIS — K810 Acute cholecystitis: Secondary | ICD-10-CM

## 2018-03-03 LAB — COMPREHENSIVE METABOLIC PANEL
ALT: 20 U/L (ref 0–44)
AST: 56 U/L — ABNORMAL HIGH (ref 15–41)
Albumin: 1.9 g/dL — ABNORMAL LOW (ref 3.5–5.0)
Alkaline Phosphatase: 102 U/L (ref 38–126)
Anion gap: 5 (ref 5–15)
BILIRUBIN TOTAL: 0.5 mg/dL (ref 0.3–1.2)
BUN: 61 mg/dL — AB (ref 8–23)
CO2: 19 mmol/L — ABNORMAL LOW (ref 22–32)
Calcium: 7.5 mg/dL — ABNORMAL LOW (ref 8.9–10.3)
Chloride: 115 mmol/L — ABNORMAL HIGH (ref 98–111)
Creatinine, Ser: 2.06 mg/dL — ABNORMAL HIGH (ref 0.61–1.24)
GFR, EST AFRICAN AMERICAN: 38 mL/min — AB (ref >60)
GFR, EST NON AFRICAN AMERICAN: 33 mL/min — AB (ref >60)
Glucose, Bld: 136 mg/dL — ABNORMAL HIGH (ref 70–99)
POTASSIUM: 3.4 mmol/L — AB (ref 3.5–5.1)
Sodium: 139 mmol/L (ref 135–145)
Total Protein: 5.1 g/dL — ABNORMAL LOW (ref 6.5–8.1)

## 2018-03-03 LAB — PROTIME-INR
INR: 1.96
PROTHROMBIN TIME: 22 s — AB (ref 11.4–15.2)

## 2018-03-03 LAB — GLUCOSE, CAPILLARY
Glucose-Capillary: 122 mg/dL — ABNORMAL HIGH (ref 70–99)
Glucose-Capillary: 144 mg/dL — ABNORMAL HIGH (ref 70–99)
Glucose-Capillary: 154 mg/dL — ABNORMAL HIGH (ref 70–99)
Glucose-Capillary: 193 mg/dL — ABNORMAL HIGH (ref 70–99)

## 2018-03-03 MED ORDER — SODIUM CHLORIDE 0.9 % IV SOLN: 850.0000 mg | Freq: Every day | INTRAVENOUS | Status: DC

## 2018-03-03 MED ORDER — TECHNETIUM TC 99M MEBROFENIN IV KIT
5.1900 | PACK | Freq: Once | INTRAVENOUS | Status: AC | PRN
  Administered 2018-03-03: 5.19 via INTRAVENOUS

## 2018-03-03 MED ORDER — POTASSIUM CHLORIDE CRYS ER 20 MEQ PO TBCR
40.0000 meq | EXTENDED_RELEASE_TABLET | Freq: Once | ORAL | Status: AC
  Administered 2018-03-03: 40 meq via ORAL
  Filled 2018-03-03: qty 2

## 2018-03-03 MED ORDER — PIPERACILLIN-TAZOBACTAM 3.375 G IVPB
3.3750 g | Freq: Three times a day (TID) | INTRAVENOUS | Status: DC
  Administered 2018-03-03 – 2018-03-04 (×3): 3.375 g via INTRAVENOUS
  Filled 2018-03-03 (×4): qty 50

## 2018-03-03 NOTE — Progress Notes (Signed)
OT Cancellation Note  Patient Details Name: [name] MRN: [id number] DOB: August 14, 1955   Cancelled Treatment:    Reason Eval/Treat Not Completed: Patient at procedure or test/ unavailable; will follow up as schedule permits.  Lou Cal, OT [facility] Pager [phone] Office [phone]   [name] 03/03/2018, 4:03 PM

## 2018-03-03 NOTE — Progress Notes (Addendum)
Pharmacy Antibiotic Note  Erwin Kazden Largo is a 62 y.o. male continues on Zosyn x 7 days for gangrenous cholecystitis.  Daptomycin for MSSA bacteremia and endocarditis is on hold while on Zosyn.  Renal function improved, crcl now > 20 ml/min.   Coumadin for atrial fibrillation resumed on 10/27 with small dose of 0.5 mg.   INR 2.18>1.96.  Spoke with Sandi Carne, PA.  To hold Coumadin today,   Plan: Adjust Zosyn 3.375 gm IV q12h to q8hrs for improved renal function. Zosyn to continue through 03/05/18 pm.the Daptomycin 8 mg/kg IV scheduled to resume on 03/05/18 and stop on 03/12/18. Daptomycin dosing interval adjusted q48h to q24hrs for improved renal function. Doxycycline 100 mg PO BID to begin on 11/7 and continue x 4 weeks.   Hold Coumadin today, in case that he ERCP is needed. In  Continue daily PT/INR.  Height: 5\' 10"  (177.8 cm) Weight: 238 lb 1.6 oz (108 kg) IBW/kg (Calculated) : 73  Temp (24hrs), Avg:97.8 F (36.6 C), Min:97.7 F (36.5 C), Max:98 F (36.7 C)  Recent Labs  Lab 02/26/18 0350 02/26/18 1208 02/27/18 0402 02/28/18 0350 03/01/18 0340 03/02/18 0342 03/02/18 0348 03/03/18 0404  WBC 30.3* 29.2*  --  17.1* 12.1*  --  12.6*  --   CREATININE 2.66*  --  3.34* 3.15* 2.77* 2.55*  --  2.06*    Estimated Creatinine Clearance: 46.3 mL/min (A) (by C-G formula based on SCr of 2.06 mg/dL (H)).    Allergies  Allergen Reactions  . Ace Inhibitors Cough  . Cefazolin     Possible cause of AKI, AIN     Arty Baumgartner, RPh Pager: [phone] or phone: [phone] 03/03/2018 12:35 PM

## 2018-03-03 NOTE — Progress Notes (Signed)
[facility] Progress Note   Pt is a 62 y.o. yo male who was admitted on 01/27/2018 with AKI req transient HD- thought to be due to AIN vs ATN- recalled when kidney function trending worse again with decreased UOP   Assessment/ Plan:   1. Renal- prior AKI thought due to AIN vs ATN- ancef was stopped and put on dapto.  Kidneys appeared to recover but then started trending worse again.  Had been NPO -  gave IVF and UOP seemed to respond-  U/A with greater than 50 WBC- infection vs AIN-  Culture neg- considered rocephin as a source of recurrent AIN - changed to zosyn and dapto -> doxy.  -  fortunately renal function is continuing to trend better  - will sign off at this time; please reconsult as needed.  2. HTN/vol-gave some low level IVF- some edema so stopped  3. Anemia- ckd and hosp- has received ESA- nothing else for now 4. Cholecystitis- abx and surgery - post op from lap chole 5. Hypokalemia- repleted- good yest- down again today - repletion ordered     Subjective:   Mild lower abd discomfort. Denies f/c/n/v/dyspnea.   Objective:   BP 121/67 (BP Location: Left Arm)   Pulse 75   Temp 97.7 F (36.5 C) (Oral)   Resp 18   Ht 5\' 10"  (1.778 m)   Wt 108 kg   SpO2 98%   BMI 34.16 kg/m   Intake/Output Summary (Last 24 hours) at 03/03/2018 1259 Last data filed at 03/03/2018 0955 Gross per 24 hour  Intake 1112.6 ml  Output 2585 ml  Net -1472.4 ml   Weight change: 0 kg  Physical Exam: General: alert, NAD- looks beter Heart: RRR Lungs:clear Abdomen: soft, non tender Extremities:  Some pitting dep edema  Imaging: No results found.  Labs: BMET Recent Labs  Lab 02/25/18 0335 02/26/18 0350 02/27/18 0402 02/28/18 0350 03/01/18 0340 03/02/18 0342 03/03/18 0404  NA 136 135 134* 134* 135 138 139  K 4.2 3.6 3.4* 3.0* 4.0 3.0* 3.4*  CL 105 104 105 105 107 107 115*  CO2 21* 21* 19* 19* 18* 18* 19*  GLUCOSE 231* 191* 178* 80 267* 127* 136*  BUN 65* 72*  84* 86* 80* 78* 61*  CREATININE 2.45* 2.66* 3.34* 3.15* 2.77* 2.55* 2.06*  CALCIUM 8.1* 8.0* 7.7* 7.4* 7.5* 7.6* 7.5*  PHOS 3.8  --   --  4.9* 5.7* 4.6  --    CBC Recent Labs  Lab 02/26/18 1208 02/28/18 0350 03/01/18 0340 03/02/18 0348  WBC 29.2* 17.1* 12.1* 12.6*  HGB 8.8* 8.6* 9.6* 9.1*  HCT 29.5* 27.6* 31.5* 29.6*  MCV 90.5 88.7 89.5 90.2  PLT 237 230 242 192    Medications:    . Chlorhexidine Gluconate Cloth  6 each Topical Q0600  . [START ON 03/13/2018] doxycycline  100 mg Oral Q12H  . feeding supplement  1 Container Oral TID WC  . insulin aspart  0-5 Units Subcutaneous QHS  . insulin aspart  0-9 Units Subcutaneous TID WC  . insulin aspart  4 Units Subcutaneous TID WC  . insulin glargine  22 Units Subcutaneous QHS  . latanoprost  1 drop Both Eyes QHS  . mouth rinse  15 mL Mouth Rinse BID  . metoprolol tartrate  50 mg Oral BID  . multivitamin  1 tablet Oral QHS  . nystatin   Topical BID  . pantoprazole  40 mg Oral Q1200  . saccharomyces boulardii  250 mg Oral  BID  . traZODone  50 mg Oral QHS  . Warfarin - Pharmacist Dosing Inpatient   Does not apply q1800      Otelia Santee, MD 03/03/2018, 12:59 PM

## 2018-03-03 NOTE — Progress Notes (Signed)
PROGRESS NOTE    [name]  ELF:[id number] DOB: 1956-01-31 DOA: 01/27/2018 PCP: [name], MD    Brief Narrative:  62 y.o. male with a history of sepsis due to right foot osteomyelitis s/p transtibial amputation and found to have MSSA bacteremia with splenic infarct and tricuspid valve endocarditis. He was discharged on ancef to [facility] but was quickly brought back in due to oliguria and found to have acute renal failure. He developed acute respiratory failure and transferred to ICU on CCM service. Antibiotics changed to daptomycin per ID and nephrology oversaw hemodialysis through right IJ temporary catheter on 10/12 and 10/14. With improvement in respiratory status he was transferred to the medical floor. Nephrology following closely for signs of renal recovery. Creatinine trending downward as of 10/17 with increasing urine output. Temporary HD catheter removed 10/21. On 10/22 the patient developed acute abdominal pain worse on the right with nausea, vomiting, and inability to take po. CT abd/pelvis demonstrated distended gallbladder with gallstones concerning for cholecystitis. Also showed anasarca, ascites, and pleural effusions, progressive T12-L1 changes noticed on previous study, and stable splenic lesions.   Assessment & Plan:   Principal Problem:   Acute renal failure (ARF) (HCC) Active Problems:   Insulin-requiring or dependent type II diabetes mellitus ([place])   Essential hypertension   CAD (coronary artery disease)   Leukocytosis   Thrombocytopenia (HCC)   S/P BKA (below knee amputation) unilateral, right (HCC)   Endocarditis of tricuspid valve   Hyponatremia   PAF (paroxysmal atrial fibrillation) (HCC)   Aortic atherosclerosis (HCC)   Acute hypoxemic respiratory failure (HCC)   AKI (acute kidney injury) ([place])   Acute gangrenous cholecystitis s/p lap cholecystectomy 02/28/2018  Oliguric acute renal failure: Due to ATN vs. ancef-induced AIN. Continued slow  improvement until 10/22. CrCl 67m/min and UOP satisfactory. Pulled temporary catheter 10/21. Renal function had worsened. Nephrology continuing to follow. Improved with IVF hydration  Acute abdominal pain, nausea, vomiting secondary to gangrenous cholecystitis: CT abd/pelvis with cholelithiasis and consistent changes with cholecystitis, though with diffuse edema/anasarca, could be false positive.  - HIDA positive for acute cholecystitis -General surgery following and pt is now s/p cholecystectomy -Per ID, recommendation to continue on zosyn through 11/6, then transition to doxy afterwards for an additional 4 weeks  Paroxysmal atrial fibrillation: New diagnosis during admission. Maintaining sinus rhythm. Cardiology consulted, signed off 10/3. - Continue coumadin dosed per pharmacy. - Continue metoprolol - Follow up with Dr. [name] 3-4 weeks post discharge.  - remains rate controlled  MSSA bacteremia and tricuspid valve endocarditis and splenic infarcts: Stopped ancef per above - Initial recommendations to complete daptomycin to complete 6 weeks Tx (through 11/2). - CK transiently mildly elevated but since resolved. Recheck CK weekly. - Due to continued T12-L1 lesion, seen on CT 10/22 progressing since previous exam, ID recommends starting doxycycline 10106mpo BID on a full stomach following completion of daptomycin (initial C&S reviewed, was pansensitive). -discussed with ID with recommendation for transition to zosyn x 5 days the transition to doxy afterwrds  Diarrhea: No indication for CDiff testing. Possibly due to malabsorption/cholecystitis, though this symptom preceded abd pain/N/V.  - Patient is continued on probiotics 2 weeks (final dose 11/16) following conclusion of antibiotics. -Pateint denies abd pain -Seems to be improving. Only one BM overnight  PVD s/p bilateral transtibial amputations: Reevaluated by Dr. [name] this hospitalization, staples removed from recent  right TTA on 10/15.  - Plan for prosthetic fitting as outpatient  Anemia of acute and  chronic disease: Stable hgb.  - Got aranesp [place] 10/18. -Recheck CBC in AM  T12-L1 Discitis/osteomyelitis: Loss of cortex along anterior inferior aspect of T12 vertebral body, lucency in posterior aspect of L1 vertebral body. Seems to have progressed since previous exam.  - D/w ID, will prolong antibiotics course with doxycycline as above.  - Will need MRI w/ and w/o contrast for further evaluation closer to conclusion of antibiotics. Not complaining of significant back pain. - Currently stable  Acute hypoxic respiratory failure:  -Due to pulmonary edema. -Remains on minimal O2 support  Iron deficiency anemia and anemia of acute illness:  - Hgb stable at this time 8's. - Will continue with iron supplementation as tolerated -Currently stable  T2DM: A1c 8.5%.  - continue SSI coverage while in hospital  Thrombocytopenia: Improved  Oral candidiasis: Treated with fluconazole  Obesity: BMI 34.  - Dietitian was consulted  DVT prophylaxis: coumadin Code Status: Full Family Communication: Pt in room, family not at bedside Disposition Plan: SNF, timing uncertain  Consultants:   ID  IR  Cardiology  PCCM  Nephrology  General surgery  Procedures:   Temporary HD catheter  HD  Cholecystectomy 10/25  Antimicrobials: Anti-infectives (From admission, onward)   Start     Dose/Rate Route Frequency Ordered Stop   03/13/18 1000  doxycycline (VIBRA-TABS) tablet 100 mg     100 mg Oral Every 12 hours 03/01/18 1217 04/10/18 0959   03/06/18 1800  DAPTOmycin (CUBICIN) 385 mg in sodium chloride 0.9 % IVPB  Status:  Discontinued     8 mg/kg  48.1 kg 215.4 mL/hr over 30 Minutes Intravenous Every 48 hours 03/01/18 1213 03/02/18 1158   03/05/18 2000  DAPTOmycin (CUBICIN) 385 mg in sodium chloride 0.9 % IVPB  Status:  Discontinued     8 mg/kg  48.1 kg 215.4 mL/hr over 30 Minutes  Intravenous Every 48 hours 03/02/18 1158 03/03/18 1322   03/05/18 2000  DAPTOmycin (CUBICIN) 850 mg in sodium chloride 0.9 % IVPB     850 mg 234 mL/hr over 30 Minutes Intravenous Daily 03/03/18 1322 03/13/18 1959   03/05/18 1800  DAPTOmycin (CUBICIN) 385 mg in sodium chloride 0.9 % IVPB  Status:  Discontinued     8 mg/kg  48.1 kg 215.4 mL/hr over 30 Minutes Intravenous Every 48 hours 03/01/18 1200 03/01/18 1213   03/03/18 1600  piperacillin-tazobactam (ZOSYN) IVPB 3.375 g     3.375 g 12.5 mL/hr over 240 Minutes Intravenous Every 8 hours 03/03/18 1230 03/05/18 2359   02/28/18 1530  piperacillin-tazobactam (ZOSYN) IVPB 3.375 g  Status:  Discontinued     3.375 g 12.5 mL/hr over 240 Minutes Intravenous Every 12 hours 02/28/18 1518 03/03/18 1230   02/26/18 2000  cefTRIAXone (ROCEPHIN) 2 g in sodium chloride 0.9 % 100 mL IVPB  Status:  Discontinued     2 g 200 mL/hr over 30 Minutes Intravenous Every 24 hours 02/26/18 1911 02/28/18 1500   02/26/18 2000  metroNIDAZOLE (FLAGYL) IVPB 500 mg  Status:  Discontinued     500 mg 100 mL/hr over 60 Minutes Intravenous Every 8 hours 02/26/18 1911 02/28/18 1500   02/24/18 0000  daptomycin (CUBICIN) IVPB  Status:  Discontinued     700 mg Intravenous Every 24 hours 02/24/18 1141 03/03/18    02/23/18 2000  DAPTOmycin (CUBICIN) 700 mg in sodium chloride 0.9 % IVPB  Status:  Discontinued     700 mg 228 mL/hr over 30 Minutes Intravenous Every 24 hours 02/23/18 1233 02/26/18 1911  02/13/18 1415  ceFAZolin (ANCEF) powder 2 g     2 g Other To Surgery 02/13/18 1410 02/13/18 2142   02/13/18 1347  ceFAZolin (ANCEF) 2-4 GM/100ML-% IVPB    Note to Pharmacy:  Arlean Hopping   : cabinet override      02/13/18 1347 02/13/18 1410   01/30/18 2000  DAPTOmycin (CUBICIN) 700 mg in sodium chloride 0.9 % IVPB  Status:  Discontinued     700 mg 228 mL/hr over 30 Minutes Intravenous Every 48 hours 01/30/18 1306 02/23/18 1233   01/30/18 1000  fluconazole (DIFLUCAN) tablet 50 mg      50 mg Oral Daily 01/29/18 0930 02/04/18 0914   01/29/18 2000  DAPTOmycin (CUBICIN) 700 mg in sodium chloride 0.9 % IVPB  Status:  Discontinued     700 mg 228 mL/hr over 30 Minutes Intravenous Every 48 hours 01/29/18 1013 01/30/18 1306   01/29/18 1030  fluconazole (DIFLUCAN) tablet 100 mg     100 mg Oral  Once 01/29/18 0930 01/29/18 1121   01/28/18 2200  ceFAZolin (ANCEF) IVPB 1 g/50 mL premix  Status:  Discontinued     1 g 100 mL/hr over 30 Minutes Intravenous Every 24 hours 01/28/18 1007 01/28/18 1231   01/27/18 2245  ceFAZolin (ANCEF) IVPB 2g/100 mL premix  Status:  Discontinued     2 g 200 mL/hr over 30 Minutes Intravenous Every 12 hours 01/27/18 2243 01/28/18 1007   01/27/18 2230  ceFAZolin (ANCEF) IVPB  Status:  Discontinued    Note to Pharmacy:  Indication:  MSSA bacteremia/endocarditis Last Day of Therapy:  03/08/18 Labs - Once weekly:  CBC/D and BMP, Labs - Every other week:  ESR and CRP     2 g Intravenous Every 8 hours 01/27/18 2220 01/27/18 2227   01/27/18 2230  ceFAZolin (ANCEF) IVPB 2g/100 mL premix  Status:  Discontinued     2 g 200 mL/hr over 30 Minutes Intravenous Every 8 hours 01/27/18 2227 01/27/18 2243      Subjective: States diarrhea improved overnight. Denies abd at present  Objective: Vitals:   03/02/18 1649 03/02/18 2105 03/03/18 0457 03/03/18 0507  BP: 113/68 105/67  121/67  Pulse: 80 82  75  Resp: '18 18  18  ' Temp: 97.7 F (36.5 C) 98 F (36.7 C)  97.7 F (36.5 C)  TempSrc: Oral Oral  Oral  SpO2: 99% 98%  98%  Weight:  108 kg 108 kg   Height:        Intake/Output Summary (Last 24 hours) at 03/03/2018 1514 Last data filed at 03/03/2018 1408 Gross per 24 hour  Intake 840 ml  Output 2585 ml  Net -1745 ml   Filed Weights   03/02/18 0500 03/02/18 2105 03/03/18 0457  Weight: 108 kg 108 kg 108 kg    Examination: General exam: Conversant, in no acute distress Respiratory system: normal chest rise, clear, no audible wheezing Cardiovascular  system: regular rhythm, s1-s2 Gastrointestinal system: Nondistended, nontender, pos BS Central nervous system: No seizures, no tremors Extremities: No cyanosis, no joint deformities, s/p LE amputation Skin: No rashes, no pallor Psychiatry: Affect normal // no auditory hallucinations   Data Reviewed: I have personally reviewed following labs and imaging studies  CBC: Recent Labs  Lab 02/26/18 0350 02/26/18 1208 02/28/18 0350 03/01/18 0340 03/02/18 0348  WBC 30.3* 29.2* 17.1* 12.1* 12.6*  HGB 9.1* 8.8* 8.6* 9.6* 9.1*  HCT 29.5* 29.5* 27.6* 31.5* 29.6*  MCV 89.7 90.5 88.7 89.5 90.2  PLT 236 237 230  242 149   Basic Metabolic Panel: Recent Labs  Lab 02/25/18 0335  02/27/18 0402 02/28/18 0350 03/01/18 0340 03/02/18 0342 03/03/18 0404  NA 136   < > 134* 134* 135 138 139  K 4.2   < > 3.4* 3.0* 4.0 3.0* 3.4*  CL 105   < > 105 105 107 107 115*  CO2 21*   < > 19* 19* 18* 18* 19*  GLUCOSE 231*   < > 178* 80 267* 127* 136*  BUN 65*   < > 84* 86* 80* 78* 61*  CREATININE 2.45*   < > 3.34* 3.15* 2.77* 2.55* 2.06*  CALCIUM 8.1*   < > 7.7* 7.4* 7.5* 7.6* 7.5*  MG  --   --   --   --   --  1.5*  --   PHOS 3.8  --   --  4.9* 5.7* 4.6  --    < > = values in this interval not displayed.   GFR: Estimated Creatinine Clearance: 46.3 mL/min (A) (by C-G formula based on SCr of 2.06 mg/dL (H)). Liver Function Tests: Recent Labs  Lab 02/25/18 2055 02/26/18 0350 02/27/18 0402 02/28/18 0350 03/01/18 0340 03/02/18 0342 03/03/18 0404  AST 21 49* 51*  --   --   --  56*  ALT '23 25 27  ' --   --   --  20  ALKPHOS 140* 94 106  --   --   --  102  BILITOT 1.2 1.2 1.0  --   --   --  0.5  PROT 6.1* 6.2* 5.5*  --   --   --  5.1*  ALBUMIN 2.3* 2.2* 1.9* 1.9* 2.0* 1.8* 1.9*   No results for input(s): LIPASE, AMYLASE in the last 168 hours. No results for input(s): AMMONIA in the last 168 hours. Coagulation Profile: Recent Labs  Lab 02/27/18 0402 02/28/18 0350 03/01/18 0340 03/02/18 0348  03/03/18 0404  INR 2.57 1.77 1.88 2.18 1.96   Cardiac Enzymes: Recent Labs  Lab 02/27/18 0402  CKTOTAL 169   BNP (last 3 results) No results for input(s): PROBNP in the last 8760 hours. HbA1C: No results for input(s): HGBA1C in the last 72 hours. CBG: Recent Labs  Lab 03/02/18 1150 03/02/18 1651 03/02/18 2104 03/03/18 0741 03/03/18 1137  GLUCAP 172* 154* 170* 122* 144*   Lipid Profile: No results for input(s): CHOL, HDL, LDLCALC, TRIG, CHOLHDL, LDLDIRECT in the last 72 hours. Thyroid Function Tests: No results for input(s): TSH, T4TOTAL, FREET4, T3FREE, THYROIDAB in the last 72 hours. Anemia Panel: No results for input(s): VITAMINB12, FOLATE, FERRITIN, TIBC, IRON, RETICCTPCT in the last 72 hours. Sepsis Labs: No results for input(s): PROCALCITON, LATICACIDVEN in the last 168 hours.  Recent Results (from the past 240 hour(s))  Culture, Urine     Status: None   Collection Time: 02/28/18 10:34 AM  Result Value Ref Range Status   Specimen Description URINE, RANDOM  Final   Special Requests NONE  Final   Culture   Final    NO GROWTH Performed at [facility], [address], [place], [place] [address]    Report Status 03/01/2018 FINAL  Final  Culture, Urine     Status: None   Collection Time: 02/28/18 10:34 PM  Result Value Ref Range Status   Specimen Description URINE, RANDOM  Final   Special Requests NONE  Final   Culture   Final    NO GROWTH Performed at [facility], [place]  [address] [place], [place] [address]    Report Status 03/02/2018 FINAL  Final     Radiology Studies: No results found.  Scheduled Meds: . Chlorhexidine Gluconate Cloth  6 each Topical Q0600  . [START ON 03/13/2018] doxycycline  100 mg Oral Q12H  . feeding supplement  1 Container Oral TID WC  . insulin aspart  0-5 Units Subcutaneous QHS  . insulin aspart  0-9 Units Subcutaneous TID WC  . insulin aspart  4 Units Subcutaneous TID WC  . insulin glargine  22 Units Subcutaneous  QHS  . latanoprost  1 drop Both Eyes QHS  . mouth rinse  15 mL Mouth Rinse BID  . metoprolol tartrate  50 mg Oral BID  . multivitamin  1 tablet Oral QHS  . nystatin   Topical BID  . pantoprazole  40 mg Oral Q1200  . saccharomyces boulardii  250 mg Oral BID  . traZODone  50 mg Oral QHS  . Warfarin - Pharmacist Dosing Inpatient   Does not apply q1800   Continuous Infusions: . sodium chloride 0 mL/hr at 02/28/18 1202  . sodium chloride    . [START ON 03/05/2018] DAPTOmycin (CUBICIN)  IV    . piperacillin-tazobactam (ZOSYN)  IV       LOS: 35 days   Marylu Lund, MD [facility] Pager On Amion  If 7PM-7AM, please contact night-coverage 03/03/2018, 3:14 PM

## 2018-03-03 NOTE — Progress Notes (Signed)
Patient ID: [name], male   DOB: July 16, 1955, 61 y.o.   MRN: [id number]    3 Days Post-Op  Subjective: Patient had some pain overnight due to malpositioning of his bed.  Better this am.  Ate some oatmeal this am.    Objective: Vital signs in last 24 hours: Temp:  [97.7 F (36.5 C)-98 F (36.7 C)] 97.7 F (36.5 C) (10/28 0507) Pulse Rate:  [75-82] 75 (10/28 0507) Resp:  [18] 18 (10/28 0507) BP: (105-121)/(67-68) 121/67 (10/28 0507) SpO2:  [98 %-99 %] 98 % (10/28 0507) Weight:  [665 kg] 108 kg (10/28 0457) Last BM Date: 03/02/18  Intake/Output from previous day: 10/27 0701 - 10/28 0700 In: 1172.6 [P.O.:1080; IV Piggyback:92.6] Out: 2485 [Urine:1925; Drains:560] Intake/Output this shift: Total I/O In: -  Out: 75 [Drains:75]  PE: Abd: soft, appropriately tender, incisions c/d/i.  Drain with copious bilious output today.  560cc documented yesterday.  Lab Results:  Recent Labs    03/01/18 0340 03/02/18 0348  WBC 12.1* 12.6*  HGB 9.6* 9.1*  HCT 31.5* 29.6*  PLT 242 192   BMET Recent Labs    03/02/18 0342 03/03/18 0404  NA 138 139  K 3.0* 3.4*  CL 107 115*  CO2 18* 19*  GLUCOSE 127* 136*  BUN 78* 61*  CREATININE 2.55* 2.06*  CALCIUM 7.6* 7.5*   PT/INR Recent Labs    03/02/18 0348 03/03/18 0404  LABPROT 23.9* 22.0*  INR 2.18 1.96   CMP     Component Value Date/Time   NA 139 03/03/2018 0404   K 3.4 (L) 03/03/2018 0404   CL 115 (H) 03/03/2018 0404   CO2 19 (L) 03/03/2018 0404   GLUCOSE 136 (H) 03/03/2018 0404   BUN 61 (H) 03/03/2018 0404   CREATININE 2.06 (H) 03/03/2018 0404   CALCIUM 7.5 (L) 03/03/2018 0404   PROT 5.1 (L) 03/03/2018 0404   ALBUMIN 1.9 (L) 03/03/2018 0404   AST 56 (H) 03/03/2018 0404   ALT 20 03/03/2018 0404   ALKPHOS 102 03/03/2018 0404   BILITOT 0.5 03/03/2018 0404   GFRNONAA 33 (L) 03/03/2018 0404   GFRAA 38 (L) 03/03/2018 0404   Lipase     Component Value Date/Time   LIPASE 23 01/14/2018 1926        Studies/Results: No results found.  Anti-infectives: Anti-infectives (From admission, onward)   Start     Dose/Rate Route Frequency Ordered Stop   03/13/18 1000  doxycycline (VIBRA-TABS) tablet 100 mg     100 mg Oral Every 12 hours 03/01/18 1217 04/10/18 0959   03/06/18 1800  DAPTOmycin (CUBICIN) 385 mg in sodium chloride 0.9 % IVPB  Status:  Discontinued     8 mg/kg  48.1 kg 215.4 mL/hr over 30 Minutes Intravenous Every 48 hours 03/01/18 1213 03/02/18 1158   03/05/18 1800  DAPTOmycin (CUBICIN) 385 mg in sodium chloride 0.9 % IVPB  Status:  Discontinued     8 mg/kg  48.1 kg 215.4 mL/hr over 30 Minutes Intravenous Every 48 hours 03/01/18 1200 03/01/18 1213   03/05/18 1800  DAPTOmycin (CUBICIN) 385 mg in sodium chloride 0.9 % IVPB     8 mg/kg  48.1 kg 215.4 mL/hr over 30 Minutes Intravenous Every 48 hours 03/02/18 1158 03/13/18 1759   02/28/18 1530  piperacillin-tazobactam (ZOSYN) IVPB 3.375 g     3.375 g 12.5 mL/hr over 240 Minutes Intravenous Every 12 hours 02/28/18 1518 03/05/18 2359   02/26/18 2000  cefTRIAXone (ROCEPHIN) 2 g in sodium chloride 0.9 %  100 mL IVPB  Status:  Discontinued     2 g 200 mL/hr over 30 Minutes Intravenous Every 24 hours 02/26/18 1911 02/28/18 1500   02/26/18 2000  metroNIDAZOLE (FLAGYL) IVPB 500 mg  Status:  Discontinued     500 mg 100 mL/hr over 60 Minutes Intravenous Every 8 hours 02/26/18 1911 02/28/18 1500   02/24/18 0000  daptomycin (CUBICIN) IVPB     700 mg Intravenous Every 24 hours 02/24/18 1141 03/09/18 2359   02/23/18 2000  DAPTOmycin (CUBICIN) 700 mg in sodium chloride 0.9 % IVPB  Status:  Discontinued     700 mg 228 mL/hr over 30 Minutes Intravenous Every 24 hours 02/23/18 1233 02/26/18 1911   02/13/18 1415  ceFAZolin (ANCEF) powder 2 g     2 g Other To Surgery 02/13/18 1410 02/13/18 2142   02/13/18 1347  ceFAZolin (ANCEF) 2-4 GM/100ML-% IVPB    Note to Pharmacy:  Arlean Hopping   : cabinet override      02/13/18 1347 02/13/18  1410   01/30/18 2000  DAPTOmycin (CUBICIN) 700 mg in sodium chloride 0.9 % IVPB  Status:  Discontinued     700 mg 228 mL/hr over 30 Minutes Intravenous Every 48 hours 01/30/18 1306 02/23/18 1233   01/30/18 1000  fluconazole (DIFLUCAN) tablet 50 mg     50 mg Oral Daily 01/29/18 0930 02/04/18 0914   01/29/18 2000  DAPTOmycin (CUBICIN) 700 mg in sodium chloride 0.9 % IVPB  Status:  Discontinued     700 mg 228 mL/hr over 30 Minutes Intravenous Every 48 hours 01/29/18 1013 01/30/18 1306   01/29/18 1030  fluconazole (DIFLUCAN) tablet 100 mg     100 mg Oral  Once 01/29/18 0930 01/29/18 1121   01/28/18 2200  ceFAZolin (ANCEF) IVPB 1 g/50 mL premix  Status:  Discontinued     1 g 100 mL/hr over 30 Minutes Intravenous Every 24 hours 01/28/18 1007 01/28/18 1231   01/27/18 2245  ceFAZolin (ANCEF) IVPB 2g/100 mL premix  Status:  Discontinued     2 g 200 mL/hr over 30 Minutes Intravenous Every 12 hours 01/27/18 2243 01/28/18 1007   01/27/18 2230  ceFAZolin (ANCEF) IVPB  Status:  Discontinued    Note to Pharmacy:  Indication:  MSSA bacteremia/endocarditis Last Day of Therapy:  03/08/18 Labs - Once weekly:  CBC/D and BMP, Labs - Every other week:  ESR and CRP     2 g Intravenous Every 8 hours 01/27/18 2220 01/27/18 2227   01/27/18 2230  ceFAZolin (ANCEF) IVPB 2g/100 mL premix  Status:  Discontinued     2 g 200 mL/hr over 30 Minutes Intravenous Every 8 hours 01/27/18 2227 01/27/18 2243       Assessment/Plan MSSA bacteremia Osteomyelitis of RLE, s/p BKA H/o L BKA Endocarditis ARF, just off HD ParoxysmalA fib- oncoumadin, INR2.5 DM-II PVD H/o MI  Acute, gangrenouscholecystitis POD#3 - s/p laparoscopic cholecystectomy, placement19F blakedrain Ninfa Linden -doing well this morning -JP drain with copious bilious output.  Have spoken to GI to evaluate for stent placement given high output from drain.  This was not unexpected as cystic duct was necrotic along with entirety of his  gallbladder -mobilize to chair as able -IS -low fat diet, but likely NPO p MN for ERCP  ID -daptomycin 9/25-10/25, Zosyn 10/25 >>(transitioning to doxycycline p.o. per ID recommendations) FEN - low fat VTE -couamdin, 1.96 Foley -none Follow up: Dr. Ninfa Linden   LOS: 30 days    Henreitta Cea , [facility]  [facility] 03/03/2018, 9:37 AM Pager: [phone]

## 2018-03-03 NOTE — Consult Note (Addendum)
[facility] Consult: 9:42 AM 03/03/2018  LOS: 35 days    Referring Provider: Dr Wyline Copas  Primary Care Physician:  [name], MD in [place].   Primary Gastroenterologist: unassigned    Reason for Consultation:  GB leak in pt s/p difficult cholecystectomy   HPI: [name] is a 62 y.o. male.  PMH CAD. Bicuspid Ao valve.  S/p CABG and tissue Ao valve replacement 2010.  Obesity. Type 2 IDDM.   PVD; previous amputations of toes, foot, left BKA.  Fatty liver per CT scan.   Lives at [facility].   No previous EGD, colonoscopies.    Admissions in Sept 2019 and currenty involving sepsis, right BKA, splenic infarct, perinephric edema, AKI, temporary HD, staph aureus bacteremia (MSSA), tricuspid endocarditis new parox A fib, started Warfarin, anemia treated with Aranesp, ? T spine osteo, thrombocytopenia.    S/p 10/25  Lap chole with removal of necrotic, gangrenous GB.  + preop HIDA and cholecystitis on CT.   Other than alk phos, LFTs never signif elevated.  Keenan Bachelor drain is putting out bile.   Amounts in last days: 905, 645, 560 mls, 75 so far today.      t bili 0.5.  Alk phos 102.  AST/ALT 56/20.   WBCs 12.6, down from 30 K preop.    Overall patient's pain improved.  He is tolerated clears, full liquids and is to get solid food for lunch today.  Stools are watery, brown, 1 time daily.  This was present prior to his surgeries.   No nausea, no heartburn, no dysphagia.   Past Medical History:  Diagnosis Date  . Bicuspid aortic valve    a. 02/2009 s/p tissue AVR;  b. 07/2014 Echo: EF 55-60%, basal-mid inf HK, mild AS/MR, mod dil LA, PASP 44 mmHg.  Marland Kitchen CAD (coronary artery disease) October 2010   a. 02/2009 NSTEMI/Cath: 3VD; b. 02/2009 CABG x 5: LIMA->LAD, VG->Diag, VG->RI, VG->RVM->RPDA.  Marland Kitchen GERD  (gastroesophageal reflux disease)   . Heart murmur   . Hyperlipidemia   . Hypertension   . Myocardial infarction ([place]) 2010  . PVD (peripheral vascular disease) ([place])    a. s/p toe amputations on L foot.  . Type II diabetes mellitus ([place])     Past Surgical History:  Procedure Laterality Date  . AMPUTATION Left 2011   great and second toes  . AMPUTATION  07/27/2011   Procedure: AMPUTATION FOOT;  Surgeon: Newt Minion, MD;  Location: [place];  Service: Orthopedics;  Laterality: Left;  Left Midfoot Amputation   . AMPUTATION Left 12/08/2014   Procedure: Left Below Knee Amputation;  Surgeon: Newt Minion, MD;  Location: [place];  Service: Orthopedics;  Laterality: Left;  . AMPUTATION Right 01/17/2018   Procedure: RIGHT BELOW KNEE AMPUTATION;  Surgeon: Newt Minion, MD;  Location: [place];  Service: Orthopedics;  Laterality: Right;  . AMPUTATION TOE Right    "all my toes on my right foot have been amputated"  . AORTIC VALVE REPLACEMENT  2010   Pericardial tissue valve  . CARDIAC CATHETERIZATION  2010  . CARDIAC VALVE REPLACEMENT    . CATARACT EXTRACTION W/ INTRAOCULAR LENS  IMPLANT, BILATERAL Bilateral   . CHOLECYSTECTOMY N/A 02/28/2018   Procedure: LAPAROSCOPIC CHOLECYSTECTOMY;  Surgeon: Coralie Keens, MD;  Location: [place];  Service: General;  Laterality: N/A;  . CORONARY ARTERY BYPASS GRAFT  2010   "CABG X5"  . IR FLUORO GUIDE CV LINE RIGHT  02/13/2018  . IR REMOVAL TUN CV CATH W/O FL  02/24/2018  . IR US GUIDE VASC ACCESS RIGHT  02/13/2018  . TEE WITHOUT CARDIOVERSION N/A 01/21/2018   Procedure: TRANSESOPHAGEAL ECHOCARDIOGRAM (TEE);  Surgeon: Buford Dresser, MD;  Location: [facility];  Service: Cardiovascular;  Laterality: N/A;    Prior to Admission medications   Medication Sig Start Date End Date Taking? Authorizing Provider  aspirin 325 MG tablet Take 325 mg by mouth daily.     Yes [provider]  bisacodyl (DULCOLAX) 10 MG suppository Place 10 mg rectally  once as needed (for constipation not relieved by Milk of Magnesia).    Yes [provider]  Calcium Citrate-Vitamin D (CALCIUM CITRATE + D3) 250-200 MG-UNIT TABS Take 1 tablet by mouth 2 (two) times daily.   Yes [provider]  ceFAZolin (ANCEF) IVPB Inject 2 g into the vein every 8 (eight) hours. Indication:  MSSA bacteremia/endocarditis Last Day of Therapy:  03/08/18 Labs - Once weekly:  CBC/D and BMP, Labs - Every other week:  ESR and CRP 01/22/18 03/09/18 Yes Florencia Reasons, MD  cetirizine (ZYRTEC) 10 MG tablet Take 10 mg by mouth daily.   Yes [provider]  Dulaglutide (TRULICITY) 1.5 ZO/1.0RU SOPN Inject 1.5 mg into the skin every Monday.    Yes [provider]  glipiZIDE (GLUCOTROL XL) 10 MG 24 hr tablet Take 10 mg by mouth daily.  04/02/16  Yes [provider]  insulin aspart (NOVOLOG) 100 UNIT/ML injection Before each meal 3 times a day, 140-199 - 2 units, 200-250 - 4 units, 251-299 - 6 units,  300-349 - 8 units,  350 or above 10 units. Insulin PEN if approved, provide syringes and needles if needed. Patient taking differently: Inject 2-10 Units into the skin See admin instructions. Inject 2-10 units into the skin three times a day before meals, per sliding scale: BGL 140-199 = 2 units; 200-250 = 4 units; 251-299 = 6 units; 300-349 = 8 units; 350 or greater = 10 units 01/22/18  Yes Florencia Reasons, MD  ketoconazole (NIZORAL) 2 % cream Apply 1 application topically See admin instructions. Apply to both buttocks and "GL fold" rash two times a day   Yes [provider]  latanoprost (XALATAN) 0.005 % ophthalmic solution Place 1 drop into both eyes at bedtime. 07/13/14  Yes [provider]  losartan (COZAAR) 50 MG tablet Take 50 mg by mouth daily.  07/19/14  Yes [provider]  magnesium hydroxide (MILK OF MAGNESIA) 400 MG/5ML suspension Take 30 mLs by mouth once as needed for mild constipation.   Yes [provider]  metoprolol  (LOPRESSOR) 50 MG tablet TAKE 2 TABLETS BY MOUTH EVERY MORNING AND 1 EVERY EVENING Patient taking differently: Take 50-100 mg by mouth See admin instructions. Take 100 mg by mouth in the morning and 50 mg in the evening 12/30/15  Yes Fenton Malling M, PA-C  Multiple Vitamin (MULTIVITAMIN) tablet Take 1 tablet by mouth daily.     Yes [provider]  naproxen sodium (ANAPROX) 220 MG tablet Take 220 mg by mouth 2 (two) times  daily as needed (pain).   Yes [provider]  pantoprazole (PROTONIX) 40 MG tablet Take 40 mg by mouth daily.     Yes [provider]  polyethylene glycol (MIRALAX / GLYCOLAX) packet Take 17 g by mouth daily as needed for mild constipation. 01/22/18  Yes Florencia Reasons, MD  simvastatin (ZOCOR) 20 MG tablet Take 20 mg by mouth daily at 6 PM.  08/17/14  Yes [provider]  Sodium Phosphates (RA SALINE ENEMA) 19-7 GM/118ML ENEM Place 1 enema rectally once as needed (for constipation not relieved by Dulcolax suppository and notify MD if no relief from enema).   Yes [provider]  TOUJEO SOLOSTAR 300 UNIT/ML SOPN Inject 30 Units into the skin See admin instructions. Use up to 100 units daily Patient taking differently: Inject 30 Units into the skin daily.  01/22/18  Yes Florencia Reasons, MD  daptomycin (CUBICIN) IVPB Inject 700 mg into the vein daily for 13 days. Indication:  MSSA bacteremia and TV IE Last Day of Therapy:  03/08/18 Labs - Once weekly:  CBC/D, BMP, and CPK Labs - Every other week:  ESR and CRP 02/24/18 03/09/18  Patrecia Pour, MD  nystatin (MYCOSTATIN/NYSTOP) powder Apply topically 2 (two) times daily. 02/24/18   Patrecia Pour, MD  oxyCODONE (OXY IR/ROXICODONE) 5 MG immediate release tablet Take 1 tablet (5 mg total) by mouth every 6 (six) hours as needed (for pain). 02/24/18   Patrecia Pour, MD  saccharomyces boulardii (FLORASTOR) 250 MG capsule Take 1 capsule (250 mg total) by mouth 2 (two) times daily for 25 days. 02/24/18 03/21/18   Patrecia Pour, MD  warfarin (COUMADIN) 2 MG tablet Take 1 tablet (2 mg total) by mouth daily at 6 PM. 02/24/18   Patrecia Pour, MD  furosemide (LASIX) 40 MG tablet Take 40 mg by mouth 2 (two) times daily.    07/23/11  [provider]    Scheduled Meds: . Chlorhexidine Gluconate Cloth  6 each Topical Q0600  . [START ON 03/13/2018] doxycycline  100 mg Oral Q12H  . feeding supplement  1 Container Oral TID WC  . insulin aspart  0-5 Units Subcutaneous QHS  . insulin aspart  0-9 Units Subcutaneous TID WC  . insulin aspart  4 Units Subcutaneous TID WC  . insulin glargine  22 Units Subcutaneous QHS  . latanoprost  1 drop Both Eyes QHS  . mouth rinse  15 mL Mouth Rinse BID  . metoprolol tartrate  50 mg Oral BID  . multivitamin  1 tablet Oral QHS  . nystatin   Topical BID  . pantoprazole  40 mg Oral Q1200  . saccharomyces boulardii  250 mg Oral BID  . traZODone  50 mg Oral QHS  . Warfarin - Pharmacist Dosing Inpatient   Does not apply q1800   Infusions: . sodium chloride 0 mL/hr at 02/28/18 1202  . sodium chloride    . [START ON 03/05/2018] DAPTOmycin (CUBICIN)  IV    . piperacillin-tazobactam (ZOSYN)  IV 3.375 g (03/03/18 0830)   PRN Meds: sodium chloride, acetaminophen **OR** acetaminophen, clonazepam, HYDROmorphone (DILAUDID) injection, loperamide, ondansetron **OR** ondansetron (ZOFRAN) IV, simethicone, sodium chloride flush, traMADol   Allergies as of 01/27/2018 - Review Complete 01/27/2018  Allergen Reaction Noted  . Ace inhibitors Cough     Family History  Problem Relation Age of Onset  . Heart attack Father        early age  . Heart disease Father   . Stroke  Father   . Heart attack Brother        At age 37  . Stroke Brother   . Diabetes type II Mother   . Diabetes type II Sister   . Diabetes Unknown   . Coronary artery disease Unknown     Social History   Socioeconomic History  . Marital status: Single    Spouse name: Not on file  . Number of children:  Not on file  . Years of education: Not on file  . Highest education level: Not on file  Occupational History  . Occupation: Publishing rights manager: [employer]  . Financial resource strain: Not on file  . Food insecurity:    Worry: Not on file    Inability: Not on file  . Transportation needs:    Medical: Not on file    Non-medical: Not on file  Tobacco Use  . Smoking status: Former Smoker    Packs/day: 1.00    Years: 24.00    Pack years: 24.00    Types: Cigarettes    Last attempt to quit: 10/15/2000    Years since quitting: 17.3  . Smokeless tobacco: Never Used  Substance and Sexual Activity  . Alcohol use: Yes    Comment: 01/15/2018 "1-2 drinks/month"  . Drug use: No  . Sexual activity: Not Currently  Lifestyle  . Physical activity:    Days per week: Not on file    Minutes per session: Not on file  . Stress: Not on file  Relationships  . Social connections:    Talks on phone: Not on file    Gets together: Not on file    Attends religious service: Not on file    Active member of club or organization: Not on file    Attends meetings of clubs or organizations: Not on file    Relationship status: Not on file  . Intimate partner violence:    Fear of current or ex partner: Not on file    Emotionally abused: Not on file    Physically abused: Not on file    Forced sexual activity: Not on file  Other Topics Concern  . Not on file  Social History Narrative   The patient lives alone in [place].   He is on a diabetic diet with regular exercise at work, but little outside of his work.    REVIEW OF SYSTEMS: Constitutional: Still significantly weak but strength is improving. ENT:  No nose bleeds Pulm: No shortness of breath or cough. CV:  No palpitations, no LE edema.  No chest pain GU:  No hematuria, no frequency GI:  Per HPI Heme: No unusual bleeding or bruising Transfusions:  None this admission. Neuro:  No headaches, no peripheral  tingling or numbness Derm:  No itching, no rash or sores.  The incision from his right BKA is not giving him any trouble Endocrine:  No sweats or chills.  No polyuria or dysuria Immunization: None recorded in epic. Travel:  None beyond local counties in last few months.    PHYSICAL EXAM: Vital signs in last 24 hours: Vitals:   03/02/18 2105 03/03/18 0507  BP: 105/67 121/67  Pulse: 82 75  Resp: 18 18  Temp: 98 F (36.7 C) 97.7 F (36.5 C)  SpO2: 98% 98%   Wt Readings from Last 3 Encounters:  03/03/18 108 kg  01/23/18 95.2 kg  01/16/18 95.2 kg    General: Chronically ill looking, alert,  comfortable.  Sallow coloring. Head: No facial asymmetry or swelling.  No signs of head trauma. Eyes: Lateral icterus.  No conjunctival pallor.  EOMI. Ears: Not hard of hearing. Nose: Congestion or discharge. Mouth: Moist, pink, clear oropharynx.  Tongue midline. Neck: No mass, no JVD, no thyromegaly. Lungs: Diminished breath sounds but clear bilaterally. Heart: Distant heart sounds.  S1, S2 audible.  No MRG.  Regular rate and rhythm. Abdomen: Soft.  Tender in the mid and upper abdomen, no guarding or rebound..  Bowel sounds active.  JP drain with brown but see-through clear watery effluent.  No blood evident. Rectal: Deferred Musc/Skeltl: Joint swelling, redness or gross deformity. Extremities: CCE. Neurologic: Alert.  Oriented x3.  Moves all 4 limbs.  No tremor.  Limb strength not tested.  Look thin. Skin: Sallow coloring.  No telangiectasia.  No sores or rashes. Nodes: No cervical adenopathy. Psych: Calm, pleasant, fluid speech.     Intake/Output from previous day: 10/27 0701 - 10/28 0700 In: 1172.6 [P.O.:1080; IV Piggyback:92.6] Out: 2485 [Urine:1925; Drains:560] Intake/Output this shift: Total I/O In: -  Out: 75 [Drains:75]  LAB RESULTS: Recent Labs    03/01/18 0340 03/02/18 0348  WBC 12.1* 12.6*  HGB 9.6* 9.1*  HCT 31.5* 29.6*  PLT 242 192   BMET Lab Results    Component Value Date   NA 139 03/03/2018   NA 138 03/02/2018   NA 135 03/01/2018   K 3.4 (L) 03/03/2018   K 3.0 (L) 03/02/2018   K 4.0 03/01/2018   CL 115 (H) 03/03/2018   CL 107 03/02/2018   CL 107 03/01/2018   CO2 19 (L) 03/03/2018   CO2 18 (L) 03/02/2018   CO2 18 (L) 03/01/2018   GLUCOSE 136 (H) 03/03/2018   GLUCOSE 127 (H) 03/02/2018   GLUCOSE 267 (H) 03/01/2018   BUN 61 (H) 03/03/2018   BUN 78 (H) 03/02/2018   BUN 80 (H) 03/01/2018   CREATININE 2.06 (H) 03/03/2018   CREATININE 2.55 (H) 03/02/2018   CREATININE 2.77 (H) 03/01/2018   CALCIUM 7.5 (L) 03/03/2018   CALCIUM 7.6 (L) 03/02/2018   CALCIUM 7.5 (L) 03/01/2018   LFT Recent Labs    03/01/18 0340 03/02/18 0342 03/03/18 0404  PROT  --   --  5.1*  ALBUMIN 2.0* 1.8* 1.9*  AST  --   --  56*  ALT  --   --  20  ALKPHOS  --   --  102  BILITOT  --   --  0.5   PT/INR Lab Results  Component Value Date   INR 1.96 03/03/2018   INR 2.18 03/02/2018   INR 1.88 03/01/2018   Hepatitis Panel No results for input(s): HEPBSAG, HCVAB, HEPAIGM, HEPBIGM in the last 72 hours. C-Diff No components found for: CDIFF Lipase     Component Value Date/Time   LIPASE 23 01/14/2018 1926    Drugs of Abuse     Component Value Date/Time   LABOPIA NONE DETECTED 01/15/2018 0302   COCAINSCRNUR NONE DETECTED 01/15/2018 0302   LABBENZ NONE DETECTED 01/15/2018 0302   AMPHETMU NONE DETECTED 01/15/2018 0302   THCU NONE DETECTED 01/15/2018 0302   LABBARB NONE DETECTED 01/15/2018 0302     RADIOLOGY STUDIES: No results found.    IMPRESSION:   *  suspect bile leak in pt 3 day post op from lap chole of necrotic GB with large volume of bile drainage.    *   Protein malnutrition.  Albumin 1.9  *   AKI  requiring temporary HD.    *  Tricuspid endocarditis, previous tissue AVR and CABG.   thoraco lumbar osteo.   MSSA bacteremia.   On Zosyn.     *  New PAF, started on Coumadin but discontinued 10/23, restarted low dose  yesterday.       *  bil BKAs.  Left 2016 and right 01/2018  *   [place] anemia.  Started on Aranesp. Feraheme on 10/14 and 10/21.     *   IDDM   PLAN:     *   Ordered HIDA scan to confirm bile leak.  If, as suspected, leak present, set pt up for ERCP tomorrow No need for NPO for scan, confirmed this with NM tech.    Hold Coumadin in case ERCP needed.  D/w Virgel Bouquet  03/03/2018, 9:42 AM Phone [phone]

## 2018-03-04 LAB — GLUCOSE, CAPILLARY
GLUCOSE-CAPILLARY: 161 mg/dL — AB (ref 70–99)
Glucose-Capillary: 100 mg/dL — ABNORMAL HIGH (ref 70–99)
Glucose-Capillary: 146 mg/dL — ABNORMAL HIGH (ref 70–99)
Glucose-Capillary: 169 mg/dL — ABNORMAL HIGH (ref 70–99)

## 2018-03-04 LAB — RENAL FUNCTION PANEL
ALBUMIN: 1.9 g/dL — AB (ref 3.5–5.0)
Anion gap: 5 (ref 5–15)
BUN: 48 mg/dL — AB (ref 8–23)
CALCIUM: 7.6 mg/dL — AB (ref 8.9–10.3)
CO2: 18 mmol/L — ABNORMAL LOW (ref 22–32)
CREATININE: 1.7 mg/dL — AB (ref 0.61–1.24)
Chloride: 117 mmol/L — ABNORMAL HIGH (ref 98–111)
GFR calc Af Amer: 48 mL/min — ABNORMAL LOW (ref >60)
GFR, EST NON AFRICAN AMERICAN: 42 mL/min — AB (ref >60)
Glucose, Bld: 114 mg/dL — ABNORMAL HIGH (ref 70–99)
PHOSPHORUS: 3 mg/dL (ref 2.5–4.6)
POTASSIUM: 3.9 mmol/L (ref 3.5–5.1)
SODIUM: 140 mmol/L (ref 135–145)

## 2018-03-04 LAB — PROTIME-INR
INR: 1.99
PROTHROMBIN TIME: 22.4 s — AB (ref 11.4–15.2)

## 2018-03-04 MED ORDER — PIPERACILLIN-TAZOBACTAM 3.375 G IVPB
3.3750 g | Freq: Three times a day (TID) | INTRAVENOUS | Status: AC
  Administered 2018-03-04 – 2018-03-07 (×10): 3.375 g via INTRAVENOUS
  Filled 2018-03-04 (×10): qty 50

## 2018-03-04 NOTE — Progress Notes (Signed)
          Daily Rounding Note  03/04/2018, 8:32 AM  LOS: 36 days   SUBJECTIVE:   Chief complaint: ? Bile leak in pt s/p lap chole    JP drain output 645... 560.Marland Kitchen 430 ml in last 3 days.   No bile leak on MRCP  OBJECTIVE:         Vital signs in last 24 hours:    Temp:  [97.8 F (36.6 C)-97.9 F (36.6 C)] 97.9 F (36.6 C) (10/29 0448) Pulse Rate:  [77-84] 77 (10/29 0448) Resp:  [16-20] 20 (10/29 0448) BP: (136-146)/(71-72) 139/72 (10/29 0448) SpO2:  [99 %-100 %] 100 % (10/29 0448) Last BM Date: 03/02/18 Filed Weights   03/02/18 0500 03/02/18 2105 03/03/18 0457  Weight: 108 kg 108 kg 108 kg   General: looks poorly, chronically and acutely ill.     Not re-examined.    Intake/Output from previous day: 10/28 0701 - 10/29 0700 In: 49 [P.O.:720; IV Piggyback:100] Out: 1950 [Urine:1520; Drains:430]  Intake/Output this shift: No intake/output data recorded.  Lab Results: Recent Labs    03/02/18 0348  WBC 12.6*  HGB 9.1*  HCT 29.6*  PLT 192   BMET Recent Labs    03/02/18 0342 03/03/18 0404 03/04/18 0321  NA 138 139 140  K 3.0* 3.4* 3.9  CL 107 115* 117*  CO2 18* 19* 18*  GLUCOSE 127* 136* 114*  BUN 78* 61* 48*  CREATININE 2.55* 2.06* 1.70*  CALCIUM 7.6* 7.5* 7.6*   LFT Recent Labs    03/02/18 0342 03/03/18 0404 03/04/18 0321  PROT  --  5.1*  --   ALBUMIN 1.8* 1.9* 1.9*  AST  --  56*  --   ALT  --  20  --   ALKPHOS  --  102  --   BILITOT  --  0.5  --    PT/INR Recent Labs    03/03/18 0404 03/04/18 0321  LABPROT 22.0* 22.4*  INR 1.96 1.99   Hepatitis Panel No results for input(s): HEPBSAG, HCVAB, HEPAIGM, HEPBIGM in the last 72 hours.  Studies/Results: Nm Hepatobiliary Liver Func  Result Date: 03/03/2018 CLINICAL DATA:  Status post cholecystectomy. Bile output from surgical drain. Evaluate for postop bile leak. EXAM: NUCLEAR MEDICINE HEPATOBILIARY IMAGING TECHNIQUE: Sequential images of the  abdomen were obtained out to 60 minutes following intravenous administration of radiopharmaceutical. RADIOPHARMACEUTICALS:  5.2 mCi Tc-19m  Choletec IV COMPARISON:  02/26/2018 FINDINGS: Prompt uptake and biliary excretion of activity by the liver is seen. Biliary activity passes into small bowel, consistent with patent common bile duct. No gallbladder activity is seen, consistent with prior cholecystectomy. There is no evidence of leak or extravasation of biliary activity IMPRESSION: Normal study status post cholecystectomy. No evidence of postop bile leak or biliary obstruction. Electronically Signed   By: Earle Gell M.D.   On: 03/03/2018 17:52    ASSESMENT:   *   ? Bile leak on pt s/p lap chole of necrotic GB on 10/25 MRCP does not demonstrate a leak.   Measured JP drain output is steadily slowing.   No role for ERCP   PLAN   *   GI will sign off.  Thank you.      Azucena Freed  03/04/2018, 8:32 AM Phone [phone]

## 2018-03-04 NOTE — Progress Notes (Signed)
Patient ID: [name], male   DOB: [birth year], 62 y.o.   MRN: [id number]    4 Days Post-Op  Subjective: Patient feels ok.  Eating some with no major issues.  Drain has now been emptied 3 times already this morning.  Objective: Vital signs in last 24 hours: Temp:  [97.8 F (36.6 C)-98.1 F (36.7 C)] 98.1 F (36.7 C) (10/29 0952) Pulse Rate:  [77-84] 78 (10/29 0952) Resp:  [16-20] 19 (10/29 0952) BP: (118-146)/(71-79) 118/79 (10/29 0952) SpO2:  [98 %-100 %] 98 % (10/29 0952) Last BM Date: 03/02/18  Intake/Output from previous day: 10/28 0701 - 10/29 0700 In: 44 [P.O.:720; IV Piggyback:100] Out: 1950 [Urine:1520; Drains:430] Intake/Output this shift: Total I/O In: 300 [P.O.:300] Out: 440 [Urine:350; Drains:90]  PE: Heart: regular Lungs: CTAB Abd: soft, appropriately tender in RUQ, +BS, JP Drain present and full when I walked in.  Frankly bilious.  Incisions c/d/i  Lab Results:  Recent Labs    03/02/18 0348  WBC 12.6*  HGB 9.1*  HCT 29.6*  PLT 192   BMET Recent Labs    03/03/18 0404 03/04/18 0321  NA 139 140  K 3.4* 3.9  CL 115* 117*  CO2 19* 18*  GLUCOSE 136* 114*  BUN 61* 48*  CREATININE 2.06* 1.70*  CALCIUM 7.5* 7.6*   PT/INR Recent Labs    03/03/18 0404 03/04/18 0321  LABPROT 22.0* 22.4*  INR 1.96 1.99   CMP     Component Value Date/Time   NA 140 03/04/2018 0321   K 3.9 03/04/2018 0321   CL 117 (H) 03/04/2018 0321   CO2 18 (L) 03/04/2018 0321   GLUCOSE 114 (H) 03/04/2018 0321   BUN 48 (H) 03/04/2018 0321   CREATININE 1.70 (H) 03/04/2018 0321   CALCIUM 7.6 (L) 03/04/2018 0321   PROT 5.1 (L) 03/03/2018 0404   ALBUMIN 1.9 (L) 03/04/2018 0321   AST 56 (H) 03/03/2018 0404   ALT 20 03/03/2018 0404   ALKPHOS 102 03/03/2018 0404   BILITOT 0.5 03/03/2018 0404   GFRNONAA 42 (L) 03/04/2018 0321   GFRAA 48 (L) 03/04/2018 0321   Lipase     Component Value Date/Time   LIPASE 23 01/14/2018 1926       Studies/Results: Nm  Hepatobiliary Liver Func  Result Date: 03/03/2018 CLINICAL DATA:  Status post cholecystectomy. Bile output from surgical drain. Evaluate for postop bile leak. EXAM: NUCLEAR MEDICINE HEPATOBILIARY IMAGING TECHNIQUE: Sequential images of the abdomen were obtained out to 60 minutes following intravenous administration of radiopharmaceutical. RADIOPHARMACEUTICALS:  5.2 mCi Tc-90m Choletec IV COMPARISON:  02/26/2018 FINDINGS: Prompt uptake and biliary excretion of activity by the liver is seen. Biliary activity passes into small bowel, consistent with patent common bile duct. No gallbladder activity is seen, consistent with prior cholecystectomy. There is no evidence of leak or extravasation of biliary activity IMPRESSION: Normal study status post cholecystectomy. No evidence of postop bile leak or biliary obstruction. Electronically Signed   By: JEarle GellM.D.   On: 03/03/2018 17:52    Anti-infectives: Anti-infectives (From admission, onward)   Start     Dose/Rate Route Frequency Ordered Stop   03/13/18 1000  doxycycline (VIBRA-TABS) tablet 100 mg     100 mg Oral Every 12 hours 03/01/18 1217 04/10/18 0959   03/06/18 1800  DAPTOmycin (CUBICIN) 385 mg in sodium chloride 0.9 % IVPB  Status:  Discontinued     8 mg/kg  48.1 kg 215.4 mL/hr over 30 Minutes Intravenous Every 48 hours 03/01/18 1213  03/02/18 1158   03/05/18 2000  DAPTOmycin (CUBICIN) 385 mg in sodium chloride 0.9 % IVPB  Status:  Discontinued     8 mg/kg  48.1 kg 215.4 mL/hr over 30 Minutes Intravenous Every 48 hours 03/02/18 1158 03/03/18 1322   03/05/18 2000  DAPTOmycin (CUBICIN) 850 mg in sodium chloride 0.9 % IVPB     850 mg 234 mL/hr over 30 Minutes Intravenous Daily 03/03/18 1322 03/13/18 1959   03/05/18 1800  DAPTOmycin (CUBICIN) 385 mg in sodium chloride 0.9 % IVPB  Status:  Discontinued     8 mg/kg  48.1 kg 215.4 mL/hr over 30 Minutes Intravenous Every 48 hours 03/01/18 1200 03/01/18 1213   03/03/18 1600   piperacillin-tazobactam (ZOSYN) IVPB 3.375 g     3.375 g 12.5 mL/hr over 240 Minutes Intravenous Every 8 hours 03/03/18 1230 03/05/18 2359   02/28/18 1530  piperacillin-tazobactam (ZOSYN) IVPB 3.375 g  Status:  Discontinued     3.375 g 12.5 mL/hr over 240 Minutes Intravenous Every 12 hours 02/28/18 1518 03/03/18 1230   02/26/18 2000  cefTRIAXone (ROCEPHIN) 2 g in sodium chloride 0.9 % 100 mL IVPB  Status:  Discontinued     2 g 200 mL/hr over 30 Minutes Intravenous Every 24 hours 02/26/18 1911 02/28/18 1500   02/26/18 2000  metroNIDAZOLE (FLAGYL) IVPB 500 mg  Status:  Discontinued     500 mg 100 mL/hr over 60 Minutes Intravenous Every 8 hours 02/26/18 1911 02/28/18 1500   02/24/18 0000  daptomycin (CUBICIN) IVPB  Status:  Discontinued     700 mg Intravenous Every 24 hours 02/24/18 1141 03/03/18    02/23/18 2000  DAPTOmycin (CUBICIN) 700 mg in sodium chloride 0.9 % IVPB  Status:  Discontinued     700 mg 228 mL/hr over 30 Minutes Intravenous Every 24 hours 02/23/18 1233 02/26/18 1911   02/13/18 1415  ceFAZolin (ANCEF) powder 2 g     2 g Other To Surgery 02/13/18 1410 02/13/18 2142   02/13/18 1347  ceFAZolin (ANCEF) 2-4 GM/100ML-% IVPB    Note to Pharmacy:  Arlean Hopping   : cabinet override      02/13/18 1347 02/13/18 1410   01/30/18 2000  DAPTOmycin (CUBICIN) 700 mg in sodium chloride 0.9 % IVPB  Status:  Discontinued     700 mg 228 mL/hr over 30 Minutes Intravenous Every 48 hours 01/30/18 1306 02/23/18 1233   01/30/18 1000  fluconazole (DIFLUCAN) tablet 50 mg     50 mg Oral Daily 01/29/18 0930 02/04/18 0914   01/29/18 2000  DAPTOmycin (CUBICIN) 700 mg in sodium chloride 0.9 % IVPB  Status:  Discontinued     700 mg 228 mL/hr over 30 Minutes Intravenous Every 48 hours 01/29/18 1013 01/30/18 1306   01/29/18 1030  fluconazole (DIFLUCAN) tablet 100 mg     100 mg Oral  Once 01/29/18 0930 01/29/18 1121   01/28/18 2200  ceFAZolin (ANCEF) IVPB 1 g/50 mL premix  Status:  Discontinued     1  g 100 mL/hr over 30 Minutes Intravenous Every 24 hours 01/28/18 1007 01/28/18 1231   01/27/18 2245  ceFAZolin (ANCEF) IVPB 2g/100 mL premix  Status:  Discontinued     2 g 200 mL/hr over 30 Minutes Intravenous Every 12 hours 01/27/18 2243 01/28/18 1007   01/27/18 2230  ceFAZolin (ANCEF) IVPB  Status:  Discontinued    Note to Pharmacy:  Indication:  MSSA bacteremia/endocarditis Last Day of Therapy:  03/08/18 Labs - Once weekly:  CBC/D and BMP,  Labs - Every other week:  ESR and CRP     2 g Intravenous Every 8 hours 01/27/18 2220 01/27/18 2227   01/27/18 2230  ceFAZolin (ANCEF) IVPB 2g/100 mL premix  Status:  Discontinued     2 g 200 mL/hr over 30 Minutes Intravenous Every 8 hours 01/27/18 2227 01/27/18 2243       Assessment/Plan MSSA bacteremia Osteomyelitis of RLE, s/p BKA H/o L BKA Endocarditis ARF, just off HD ParoxysmalA fib- oncoumadin, INR2.5 DM-II PVD H/o MI  Acute, gangrenouscholecystitis POD#4-s/p laparoscopic cholecystectomy, placement19F blakedrainNinfa Linden -doing well this morning -JP drain with frank bilious output.  It has already been emptied 3 times this morning.  Discussed with Dr. Brantley Stage and Dr. Havery Moros.  HIDA scan overnight was negative for bile leak, but given persistent copious bilious output from drain and frankly necrotic cystic duct noted at the time of surgery, there is still a significant concern for a bile leak.  Dr. Havery Moros is going to discuss with Dr. Ardis Hughs.  ID -daptomycin 9/25-10/25, Zosyn 10/25 >>Doxycycline --> FEN - low fat VTE -couamdin, 1.99 Foley -none Follow up: Dr. Ninfa Linden   LOS: 36 days    Henreitta Cea , [facility] 03/04/2018, 10:01 AM Pager: [phone]

## 2018-03-04 NOTE — Progress Notes (Addendum)
PROGRESS NOTE    [name]  DGL:[id number] DOB: March 26, 1956 DOA: 01/27/2018 PCP: [name], MD    Brief Narrative:  62 y.o. male with a history of sepsis due to right foot osteomyelitis s/p transtibial amputation and found to have MSSA bacteremia with splenic infarct and tricuspid valve endocarditis. He was discharged on ancef to [facility] but was quickly brought back in due to oliguria and found to have acute renal failure. He developed acute respiratory failure and transferred to ICU on CCM service. Antibiotics changed to daptomycin per ID and nephrology oversaw hemodialysis through right IJ temporary catheter on 10/12 and 10/14. With improvement in respiratory status he was transferred to the medical floor. Nephrology following closely for signs of renal recovery. Creatinine trending downward as of 10/17 with increasing urine output. Temporary HD catheter removed 10/21. On 10/22 the patient developed acute abdominal pain worse on the right with nausea, vomiting, and inability to take po. CT abd/pelvis demonstrated distended gallbladder with gallstones concerning for cholecystitis. Also showed anasarca, ascites, and pleural effusions, progressive T12-L1 changes noticed on previous study, and stable splenic lesions.   Assessment & Plan:   Principal Problem:   Acute renal failure (ARF) (HCC) Active Problems:   Insulin-requiring or dependent type II diabetes mellitus ([place])   Essential hypertension   CAD (coronary artery disease)   Leukocytosis   Thrombocytopenia (HCC)   S/P BKA (below knee amputation) unilateral, right (HCC)   Endocarditis of tricuspid valve   Hyponatremia   PAF (paroxysmal atrial fibrillation) (HCC)   Aortic atherosclerosis (HCC)   Acute hypoxemic respiratory failure (HCC)   AKI (acute kidney injury) ([place])   Acute gangrenous cholecystitis s/p lap cholecystectomy 02/28/2018  Oliguric acute renal failure: Due to ATN vs. ancef-induced AIN. Continued slow  improvement until 10/22. CrCl 66m/min and UOP satisfactory. Pulled temporary catheter 10/21. Renal function had worsened. Nephrology following. Renal function improved with IVF hydration. Will repeat BMET in AM  Acute abdominal pain, nausea, vomiting secondary to gangrenous cholecystitis: CT abd/pelvis with cholelithiasis and consistent changes with cholecystitis, though with diffuse edema/anasarca, could be false positive.  - HIDA positive for acute cholecystitis -General surgery following and pt is now s/p cholecystectomy -Concerns of possible bile leak post-cholecystectomy. GI following, plan for ERCP 10/30 -Per ID, recommendation to continue on zosyn through 11/6, then transition to doxy afterwards for an additional 4 weeks  Paroxysmal atrial fibrillation: New diagnosis during admission. Maintaining sinus rhythm. Cardiology consulted, signed off 10/3. - Continue coumadin dosed per pharmacy. - Continue metoprolol - Follow up with Dr. [name] 3-4 weeks post discharge.  - remains rate controlled at this time  MSSA bacteremia and tricuspid valve endocarditis and splenic infarcts with sepsis: Stopped ancef per above - Initial recommendations to complete daptomycin to complete 6 weeks Tx (through 11/2). - CK transiently mildly elevated but since resolved. Recheck CK weekly. - Due to continued T12-L1 lesion, seen on CT 10/22 progressing since previous exam, ID recommends starting doxycycline 1073mpo BID on a full stomach following completion of daptomycin (initial C&S reviewed, was pansensitive). -discussed with ID with recommendation for transition to zosyn then transition to doxy afterwrds per above  Diarrhea: No indication for CDiff testing. Possibly due to malabsorption/cholecystitis, though this symptom preceded abd pain/N/V.  - Patient is continued on probiotics 2 weeks (final dose 11/16) following conclusion of antibiotics. -Pateint denies abd pain -Seems to be improving. Would  continue to monitor for now  PVD s/p bilateral transtibial amputations: Reevaluated by Dr. [name]  this hospitalization, staples removed from recent right TTA on 10/15.  - Plan for prosthetic fitting as outpatient  Anemia of acute and chronic disease: Stable hgb.  - Got aranesp Gall[place]in 10/18. - Repeat CBC in AM  T12-L1 Discitis/osteomyelitis: Loss of cortex along anterior inferior aspect of T12 vertebral body, lucency in posterior aspect of L1 vertebral body. Seems to have progressed since previous exam.  - D/w ID, will prolong antibiotics course with doxycycline as above.  - Will need MRI w/ and w/o contrast for further evaluation closer to conclusion of antibiotics. Not complaining of significant back pain. - Presently stable  Acute hypoxic respiratory failure:  -Due to pulmonary edema. -On minimal O2 support  Iron deficiency anemia and anemia of acute illness:  - Hgb stable at this time 8's. - Will continue with iron supplementation as tolerated - Currently stable  T2DM: A1c 8.5%.  - continue SSI coverage while in hospital - Glucose trends reviewed, stable  Thrombocytopenia: Improved  Oral candidiasis: Treated with fluconazole  Obesity: BMI 34.  - Dietitian was consulted  DVT prophylaxis: coumadin  Code Status: Full Family Communication: Pt in room, family at bedside Disposition Plan: SNF, timing uncertain  Consultants:   ID  IR  Cardiology  PCCM  Nephrology  General surgery  Procedures:   Temporary HD catheter  HD  Cholecystectomy 10/25  Antimicrobials: Anti-infectives (From admission, onward)   Start     Dose/Rate Route Frequency Ordered Stop   03/13/18 1000  doxycycline (VIBRA-TABS) tablet 100 mg     100 mg Oral Every 12 hours 03/01/18 1217 04/10/18 0959   03/06/18 1800  DAPTOmycin (CUBICIN) 385 mg in sodium chloride 0.9 % IVPB  Status:  Discontinued     8 mg/kg  48.1 kg 215.4 mL/hr over 30 Minutes Intravenous Every 48 hours 03/01/18  1213 03/02/18 1158   03/05/18 2000  DAPTOmycin (CUBICIN) 385 mg in sodium chloride 0.9 % IVPB  Status:  Discontinued     8 mg/kg  48.1 kg 215.4 mL/hr over 30 Minutes Intravenous Every 48 hours 03/02/18 1158 03/03/18 1322   03/05/18 2000  DAPTOmycin (CUBICIN) 850 mg in sodium chloride 0.9 % IVPB  Status:  Discontinued     850 mg 234 mL/hr over 30 Minutes Intravenous Daily 03/03/18 1322 03/04/18 1401   03/05/18 1800  DAPTOmycin (CUBICIN) 385 mg in sodium chloride 0.9 % IVPB  Status:  Discontinued     8 mg/kg  48.1 kg 215.4 mL/hr over 30 Minutes Intravenous Every 48 hours 03/01/18 1200 03/01/18 1213   03/04/18 1600  piperacillin-tazobactam (ZOSYN) IVPB 3.375 g     3.375 g 12.5 mL/hr over 240 Minutes Intravenous Every 8 hours 03/04/18 1401     03/03/18 1600  piperacillin-tazobactam (ZOSYN) IVPB 3.375 g  Status:  Discontinued     3.375 g 12.5 mL/hr over 240 Minutes Intravenous Every 8 hours 03/03/18 1230 03/04/18 1401   02/28/18 1530  piperacillin-tazobactam (ZOSYN) IVPB 3.375 g  Status:  Discontinued     3.375 g 12.5 mL/hr over 240 Minutes Intravenous Every 12 hours 02/28/18 1518 03/03/18 1230   02/26/18 2000  cefTRIAXone (ROCEPHIN) 2 g in sodium chloride 0.9 % 100 mL IVPB  Status:  Discontinued     2 g 200 mL/hr over 30 Minutes Intravenous Every 24 hours 02/26/18 1911 02/28/18 1500   02/26/18 2000  metroNIDAZOLE (FLAGYL) IVPB 500 mg  Status:  Discontinued     500 mg 100 mL/hr over 60 Minutes Intravenous Every 8 hours 02/26/18 1911 02/28/18  1500   02/24/18 0000  daptomycin (CUBICIN) IVPB  Status:  Discontinued     700 mg Intravenous Every 24 hours 02/24/18 1141 03/03/18    02/23/18 2000  DAPTOmycin (CUBICIN) 700 mg in sodium chloride 0.9 % IVPB  Status:  Discontinued     700 mg 228 mL/hr over 30 Minutes Intravenous Every 24 hours 02/23/18 1233 02/26/18 1911   02/13/18 1415  ceFAZolin (ANCEF) powder 2 g     2 g Other To Surgery 02/13/18 1410 02/13/18 2142   02/13/18 1347  ceFAZolin  (ANCEF) 2-4 GM/100ML-% IVPB    Note to Pharmacy:  Arlean Hopping   : cabinet override      02/13/18 1347 02/13/18 1410   01/30/18 2000  DAPTOmycin (CUBICIN) 700 mg in sodium chloride 0.9 % IVPB  Status:  Discontinued     700 mg 228 mL/hr over 30 Minutes Intravenous Every 48 hours 01/30/18 1306 02/23/18 1233   01/30/18 1000  fluconazole (DIFLUCAN) tablet 50 mg     50 mg Oral Daily 01/29/18 0930 02/04/18 0914   01/29/18 2000  DAPTOmycin (CUBICIN) 700 mg in sodium chloride 0.9 % IVPB  Status:  Discontinued     700 mg 228 mL/hr over 30 Minutes Intravenous Every 48 hours 01/29/18 1013 01/30/18 1306   01/29/18 1030  fluconazole (DIFLUCAN) tablet 100 mg     100 mg Oral  Once 01/29/18 0930 01/29/18 1121   01/28/18 2200  ceFAZolin (ANCEF) IVPB 1 g/50 mL premix  Status:  Discontinued     1 g 100 mL/hr over 30 Minutes Intravenous Every 24 hours 01/28/18 1007 01/28/18 1231   01/27/18 2245  ceFAZolin (ANCEF) IVPB 2g/100 mL premix  Status:  Discontinued     2 g 200 mL/hr over 30 Minutes Intravenous Every 12 hours 01/27/18 2243 01/28/18 1007   01/27/18 2230  ceFAZolin (ANCEF) IVPB  Status:  Discontinued    Note to Pharmacy:  Indication:  MSSA bacteremia/endocarditis Last Day of Therapy:  03/08/18 Labs - Once weekly:  CBC/D and BMP, Labs - Every other week:  ESR and CRP     2 g Intravenous Every 8 hours 01/27/18 2220 01/27/18 2227   01/27/18 2230  ceFAZolin (ANCEF) IVPB 2g/100 mL premix  Status:  Discontinued     2 g 200 mL/hr over 30 Minutes Intravenous Every 8 hours 01/27/18 2227 01/27/18 2243      Subjective: Denies abd pain or nausea  Objective: Vitals:   03/04/18 0448 03/04/18 0952 03/04/18 1049 03/04/18 1618  BP: 139/72 118/79  134/73  Pulse: 77 78  81  Resp: _0 Temp: 97.9 F (36.6 C) 98.1 F (36.7 C)  98 F (36.7 C)  TempSrc: Oral Oral  Oral  SpO2: 100% 98%  100%  Weight:   105 kg   Height:        Intake/Output Summary (Last 24 hours) at 03/04/2018 1703 Last data  filed at 03/04/2018 1600 Gross per 24 hour  Intake 909.89 ml  Output 2595 ml  Net -1685.11 ml   Filed Weights   03/02/18 2105 03/03/18 0457 03/04/18 1049  Weight: 108 kg 108 kg 105 kg    Examination: General exam: Awake, laying in bed, in nad Respiratory system: Normal respiratory effort, no wheezing Cardiovascular system: regular rate, s1, s2 Gastrointestinal system: Soft, nondistended, positive BS, nontender, drain in place Central nervous system: CN2-12 grossly intact, strength intact Extremities: Perfused, no clubbing Skin: Normal skin turgor, no notable skin lesions seen Psychiatry: Mood  normal // no visual hallucinations   Data Reviewed: I have personally reviewed following labs and imaging studies  CBC: Recent Labs  Lab 02/26/18 0350 02/26/18 1208 02/28/18 0350 03/01/18 0340 03/02/18 0348  WBC 30.3* 29.2* 17.1* 12.1* 12.6*  HGB 9.1* 8.8* 8.6* 9.6* 9.1*  HCT 29.5* 29.5* 27.6* 31.5* 29.6*  MCV 89.7 90.5 88.7 89.5 90.2  PLT 236 237 230 242 338   Basic Metabolic Panel: Recent Labs  Lab 02/28/18 0350 03/01/18 0340 03/02/18 0342 03/03/18 0404 03/04/18 0321  NA 134* 135 138 139 140  K 3.0* 4.0 3.0* 3.4* 3.9  CL 105 107 107 115* 117*  CO2 19* 18* 18* 19* 18*  GLUCOSE 80 267* 127* 136* 114*  BUN 86* 80* 78* 61* 48*  CREATININE 3.15* 2.77* 2.55* 2.06* 1.70*  CALCIUM 7.4* 7.5* 7.6* 7.5* 7.6*  MG  --   --  1.5*  --   --   PHOS 4.9* 5.7* 4.6  --  3.0   GFR: Estimated Creatinine Clearance: 55.4 mL/min (A) (by C-G formula based on SCr of 1.7 mg/dL (H)). Liver Function Tests: Recent Labs  Lab 02/25/18 2055 02/26/18 0350 02/27/18 0402 02/28/18 0350 03/01/18 0340 03/02/18 0342 03/03/18 0404 03/04/18 0321  AST 21 49* 51*  --   --   --  56*  --   ALT _0 --   --   --  20  --   ALKPHOS 140* 94 106  --   --   --  102  --   BILITOT 1.2 1.2 1.0  --   --   --  0.5  --   PROT 6.1* 6.2* 5.5*  --   --   --  5.1*  --   ALBUMIN 2.3* 2.2* 1.9* 1.9* 2.0* 1.8*  1.9* 1.9*   No results for input(s): LIPASE, AMYLASE in the last 168 hours. No results for input(s): AMMONIA in the last 168 hours. Coagulation Profile: Recent Labs  Lab 02/28/18 0350 03/01/18 0340 03/02/18 0348 03/03/18 0404 03/04/18 0321  INR 1.77 1.88 2.18 1.96 1.99   Cardiac Enzymes: Recent Labs  Lab 02/27/18 0402  CKTOTAL 169   BNP (last 3 results) No results for input(s): PROBNP in the last 8760 hours. HbA1C: No results for input(s): HGBA1C in the last 72 hours. CBG: Recent Labs  Lab 03/03/18 1756 03/03/18 2118 03/04/18 0722 03/04/18 1134 03/04/18 1617  GLUCAP 154* 193* 100* 146* 169*   Lipid Profile: No results for input(s): CHOL, HDL, LDLCALC, TRIG, CHOLHDL, LDLDIRECT in the last 72 hours. Thyroid Function Tests: No results for input(s): TSH, T4TOTAL, FREET4, T3FREE, THYROIDAB in the last 72 hours. Anemia Panel: No results for input(s): VITAMINB12, FOLATE, FERRITIN, TIBC, IRON, RETICCTPCT in the last 72 hours. Sepsis Labs: No results for input(s): PROCALCITON, LATICACIDVEN in the last 168 hours.  Recent Results (from the past 240 hour(s))  Culture, Urine     Status: None   Collection Time: 02/28/18 10:34 AM  Result Value Ref Range Status   Specimen Description URINE, RANDOM  Final   Special Requests NONE  Final   Culture   Final    NO GROWTH Performed at [facility], [address] [place], [place] [address]    Report Status 03/01/2018 FINAL  Final  Culture, Urine     Status: None   Collection Time: 02/28/18 10:34 PM  Result Value Ref Range Status   Specimen Description URINE, RANDOM  Final   Special Requests NONE  Final  Culture   Final    NO GROWTH Performed at [facility], [place] [address] [place], [place] [address]    Report Status 03/02/2018 FINAL  Final     Radiology Studies: Nm Hepatobiliary Liver Func  Result Date: 03/03/2018 CLINICAL DATA:  Status post cholecystectomy. Bile output from surgical drain. Evaluate for  postop bile leak. EXAM: NUCLEAR MEDICINE HEPATOBILIARY IMAGING TECHNIQUE: Sequential images of the abdomen were obtained out to 60 minutes following intravenous administration of radiopharmaceutical. RADIOPHARMACEUTICALS:  5.2 mCi Tc-6m Choletec IV COMPARISON:  02/26/2018 FINDINGS: Prompt uptake and biliary excretion of activity by the liver is seen. Biliary activity passes into small bowel, consistent with patent common bile duct. No gallbladder activity is seen, consistent with prior cholecystectomy. There is no evidence of leak or extravasation of biliary activity IMPRESSION: Normal study status post cholecystectomy. No evidence of postop bile leak or biliary obstruction. Electronically Signed   By: JEarle GellM.D.   On: 03/03/2018 17:52    Scheduled Meds: . Chlorhexidine Gluconate Cloth  6 each Topical Q0600  . [START ON 03/13/2018] doxycycline  100 mg Oral Q12H  . insulin aspart  0-5 Units Subcutaneous QHS  . insulin aspart  0-9 Units Subcutaneous TID WC  . insulin aspart  4 Units Subcutaneous TID WC  . insulin glargine  22 Units Subcutaneous QHS  . latanoprost  1 drop Both Eyes QHS  . mouth rinse  15 mL Mouth Rinse BID  . metoprolol tartrate  50 mg Oral BID  . multivitamin  1 tablet Oral QHS  . nystatin   Topical BID  . pantoprazole  40 mg Oral Q1200  . saccharomyces boulardii  250 mg Oral BID  . traZODone  50 mg Oral QHS   Continuous Infusions: . sodium chloride 0 mL/hr at 02/28/18 1202  . sodium chloride    . piperacillin-tazobactam (ZOSYN)  IV 3.375 g (03/04/18 1641)     LOS: 377days   SMarylu Lund MD [facility] Pager On Amion  If 7PM-7AM, please contact night-coverage 03/04/2018, 5:03 PM

## 2018-03-04 NOTE — Clinical Social Work Note (Addendum)
CSW continuing to monitor patient's progress and progress notes reviewed. Patient not medically stable for discharge at this time. Attempted to reach admissions director, [place] at [facility] and message left. CSW will continue to follow, provide SW intervention services as needed/requested and facilitate discharge to [facility] when medically stable.  3:53 pm - Talked with [facility]'s admissions director and provided update. Informed Shantelle that updated PT/OT notes have been sent to her via the [facility], and she will be notified when patient is medically stable for discharge.  Nimrit Kehres Givens, MSW, LCSW Licensed Clinical Social Worker [place] [phone]

## 2018-03-04 NOTE — Progress Notes (Signed)
Physical Therapy Treatment Patient Details Name: [name] MRN: [id number] DOB: [birth year] Today's Date: 03/04/2018    History of Present Illness Pt is a 62 y/o male with a complicated PMH hospitalized September for sepsis secondary to right foot osteomyelitis, underwent BKA, found to have MSSA bacteremia with splenic infarct and tricuspid valve endocarditis. He was discharged to SNF and developed acute oliguria - presented to the ED where he was found to have AKI.  Developed acute respiratory failure necessitating transfer to ICU.  Temporary dialysis catheter was placed for HD. s/p lap cholecystectomy 10/25.    PT Comments    Pt progressing towards physical therapy goals. Was able to perform anterior-posterior transfer with +2 assist and use of bed pad to help pt scoot. He was educated on HEP and provided handout (see exercise section for details). Will continue to follow and progress as able per POC.    Follow Up Recommendations  SNF;Supervision/Assistance - 24 hour     Equipment Recommendations  None recommended by PT    Recommendations for Other Services Rehab consult     Precautions / Restrictions Precautions Precautions: Fall Precaution Comments: JP drain Required Braces or Orthoses: Other Brace/Splint Restrictions Weight Bearing Restrictions: No RLE Weight Bearing: Non weight bearing LLE Weight Bearing: Non weight bearing Other Position/Activity Restrictions: Has a prosthesis for LLE but cannot don at this time due to LLE edema    Mobility  Bed Mobility Overal bed mobility: Needs Assistance Bed Mobility: Supine to Sit     Supine to sit: Mod assist;+2 for physical assistance     General bed mobility comments: Rolling to right/left with min A to position pad; Able to perform long sitting with +2 mod assist to prepare for A-P transfer  Transfers Overall transfer level: Needs assistance Equipment used: None Transfers: Ecologist transfers: Mod assist;+2 physical assistance   General transfer comment: Assist of 2 for AP transfer into chair; pt able to use UEs to assist.   Ambulation/Gait             General Gait Details: Not assessed this session.   Stairs             Wheelchair Mobility    Modified Rankin (Stroke Patients Only)       Balance Overall balance assessment: Needs assistance Sitting-balance support: Bilateral upper extremity supported Sitting balance-Leahy Scale: Poor Sitting balance - Comments: Requires BUe support and Min A for sitting balance- air mattress and uneven surface not helpful.                                    Cognition Arousal/Alertness: Awake/alert Behavior During Therapy: WFL for tasks assessed/performed Overall Cognitive Status: Within Functional Limits for tasks assessed                                        Exercises Amputee Exercises Chair Push Up: 10 reps Other Exercises Other Exercises: Hip extension with pillow under thighs x10 Other Exercises: Reviewed the rest of his HEP verbally and gave handout (Medbridge Access Code [email or web address])    General Comments        Pertinent Vitals/Pain Pain Assessment: Faces Faces Pain Scale: Hurts little more Pain Location: drain site Pain Descriptors / Indicators: Discomfort;Sore Pain Intervention(s): Monitored during session  Home Living                      Prior Function            PT Goals (current goals can now be found in the care plan section) Acute Rehab PT Goals Patient Stated Goal: to feel better PT Goal Formulation: With patient Time For Goal Achievement: 03/04/18 Potential to Achieve Goals: Good Progress towards PT goals: Progressing toward goals    Frequency    Min 2X/week      PT Plan Current plan remains appropriate    Co-evaluation              AM-PAC PT "6 Clicks" Daily Activity  Outcome Measure   Difficulty turning over in bed (including adjusting bedclothes, sheets and blankets)?: Unable Difficulty moving from lying on back to sitting on the side of the bed? : Unable Difficulty sitting down on and standing up from a chair with arms (e.g., wheelchair, bedside commode, etc,.)?: Unable Help needed moving to and from a bed to chair (including a wheelchair)?: A Lot Help needed walking in hospital room?: Total Help needed climbing 3-5 steps with a railing? : Total 6 Click Score: 7    End of Session Equipment Utilized During Treatment: Gait belt Activity Tolerance: Patient tolerated treatment well Patient left: in chair;with call bell/phone within reach;with chair alarm set Nurse Communication: Mobility status;Need for lift equipment(maxi move to return to bed) PT Visit Diagnosis: Other abnormalities of gait and mobility (R26.89) Pain - Right/Left: Right Pain - part of body: (abdomen)     Time: [id number] PT Time Calculation (min) (ACUTE ONLY): 29 min  Charges:  $Therapeutic Exercise: 8-22 mins $Therapeutic Activity: 8-22 mins                     [name], PT, DPT Acute Rehabilitation Services Pager: [phone] Office: [phone]    [name] 03/04/2018, 1:01 PM

## 2018-03-04 NOTE — Progress Notes (Addendum)
Nutrition Follow-up  DOCUMENTATION CODES:   Obesity unspecified  INTERVENTION:  D/c Boost Breeze  Continue Rena-vite   Snacks between meals   NUTRITION DIAGNOSIS:   Increased nutrient needs related to wound healing, acute illness(s/p gangrenous cholecystectomy, s/p left BKA 01/17/18) as evidenced by estimated needs. Ongoing  GOAL:   Patient will meet greater than or equal to 90% of their needs Progressing  MONITOR:   PO intake, Diet advancement, Skin, Labs, I & O's  REASON FOR ASSESSMENT:   LOS    ASSESSMENT:   Mr. Doane is a 62 yo male with PMH of insulin-dependent type 2 diabetes, HTN, CAD, PVD, right BKA 12/2014, left BKA 01/17/18, endocarditis who is admitted for AKI. Temp HD cath removed 10/21. 10/22 the pt developed acute abd pain worse on right side w/ N/V and inability to eat. CT abd/pelvis showed distended gallbladder w/ gallstones suspect for cholecystitis, and showed anasarca, ascites, and pleural effusions, progressive T12-L1 disctitis/osteomyelitis. S/p cholecystectiomy 10/25 removed necrotic gallbladder and cystic duct. Pt now with increased output from surgical drain.   Pt has Boost Breeze ordered. Per chart pt refusing supplement >75% of the time. RN noted in chart that pt doesn't like Boost Breeze.   Per nephrology note pt's renal function slowly getting better.   Pt s/p lap chole 10/25; high output from JP drain (emptied 3 times this AM) but HIDA scan negative for bile leak. Per GI plan is for ERCP Thursday 10/31.   Spoke with pt at bedside who reports he is eating better and abdominal pain has markedly improved since cholecystectomy 10/25. Pt eating between 50-100% meals.   Pt does not like Boost Breeze, says it "tastes like chalk" and upsets his stomach. Per previous encounters pt also not amenable to Pro-Stat, Ensure, or Nepro.   Counseled pt on consuming adequate nutrition from meals and snacks as he is not amenable to supplement options. Will order  snacks between meals.   Medications reviewed: ss novolog, lantus, rena-vit, Protonix, Florastor Labs reviewed: CBGs 100-193, magnesium 1.5, phosphorus 3.0, GFR 42, potassium 3.9  Diet Order:   Diet Order            Diet Heart Room service appropriate? Yes; Fluid consistency: Thin  Diet effective now        Diet Carb Modified              EDUCATION NEEDS:   Education needs have been addressed  Skin:  Skin Assessment: Skin Integrity Issues: Skin Integrity Issues:: Incisions Incisions: abdomen, R leg  Last BM:  10/27 type 7  Height:   Ht Readings from Last 1 Encounters:  01/27/18 5\' 10"  (1.778 m)    Weight:   Wt Readings from Last 1 Encounters:  03/04/18 105 kg    Ideal Body Weight:  66.5 kg  BMI:  Body mass index is 33.21 kg/m.  Estimated Nutritional Needs:   Kcal:  1800-2000 kcals   Protein:  90-100  Fluid:  UOP + 1000 mL   Domenik Trice, Dietetic Intern [phone]

## 2018-03-05 DIAGNOSIS — K9189 Other postprocedural complications and disorders of digestive system: Secondary | ICD-10-CM

## 2018-03-05 DIAGNOSIS — K839 Disease of biliary tract, unspecified: Secondary | ICD-10-CM

## 2018-03-05 DIAGNOSIS — K838 Other specified diseases of biliary tract: Secondary | ICD-10-CM

## 2018-03-05 LAB — CBC
HEMATOCRIT: 32.9 % — AB (ref 39.0–52.0)
Hemoglobin: 9.8 g/dL — ABNORMAL LOW (ref 13.0–17.0)
MCH: 27.5 pg (ref 26.0–34.0)
MCHC: 29.8 g/dL — AB (ref 30.0–36.0)
MCV: 92.4 fL (ref 80.0–100.0)
Platelets: 169 10*3/uL (ref 150–400)
RBC: 3.56 MIL/uL — ABNORMAL LOW (ref 4.22–5.81)
RDW: 15.9 % — AB (ref 11.5–15.5)
WBC: 13.4 10*3/uL — ABNORMAL HIGH (ref 4.0–10.5)
nRBC: 0 % (ref 0.0–0.2)

## 2018-03-05 LAB — RENAL FUNCTION PANEL
ANION GAP: 5 (ref 5–15)
Albumin: 1.9 g/dL — ABNORMAL LOW (ref 3.5–5.0)
BUN: 35 mg/dL — ABNORMAL HIGH (ref 8–23)
CALCIUM: 7.7 mg/dL — AB (ref 8.9–10.3)
CHLORIDE: 116 mmol/L — AB (ref 98–111)
CO2: 20 mmol/L — AB (ref 22–32)
CREATININE: 1.58 mg/dL — AB (ref 0.61–1.24)
GFR, EST AFRICAN AMERICAN: 53 mL/min — AB (ref >60)
GFR, EST NON AFRICAN AMERICAN: 46 mL/min — AB (ref >60)
Glucose, Bld: 115 mg/dL — ABNORMAL HIGH (ref 70–99)
Phosphorus: 2.5 mg/dL (ref 2.5–4.6)
Potassium: 3.7 mmol/L (ref 3.5–5.1)
Sodium: 141 mmol/L (ref 135–145)

## 2018-03-05 LAB — GLUCOSE, CAPILLARY
Glucose-Capillary: 96 mg/dL (ref 70–99)
Glucose-Capillary: 137 mg/dL — ABNORMAL HIGH (ref 70–99)
Glucose-Capillary: 131 mg/dL — ABNORMAL HIGH (ref 70–99)
Glucose-Capillary: 173 mg/dL — ABNORMAL HIGH (ref 70–99)

## 2018-03-05 LAB — PROTIME-INR
INR: 1.89
Prothrombin Time: 21.4 seconds — ABNORMAL HIGH (ref 11.4–15.2)

## 2018-03-05 MED ORDER — PHYTONADIONE 5 MG PO TABS
5.0000 mg | ORAL_TABLET | Freq: Once | ORAL | Status: AC
  Administered 2018-03-05: 5 mg via ORAL
  Filled 2018-03-05: qty 1

## 2018-03-05 NOTE — Progress Notes (Signed)
Daily Rounding Note  03/05/2018, 1:39 PM  LOS: 37 days   SUBJECTIVE:   Chief complaint: ? Bile leak   Some pain in RUQ, not severe.  No nausea, tolerating HH diet.    OBJECTIVE:         Vital signs in last 24 hours:    Temp:  [97.7 F (36.5 C)-98.2 F (36.8 C)] 97.7 F (36.5 C) (10/30 0728) Pulse Rate:  [79-85] 79 (10/30 0728) Resp:  [16-18] 18 (10/30 0728) BP: (127-158)/(68-82) 158/82 (10/30 0728) SpO2:  [100 %] 100 % (10/30 0728) Last BM Date: 03/04/18 Filed Weights   03/02/18 2105 03/03/18 0457 03/04/18 1049  Weight: 108 kg 108 kg 105 kg   General: looks ill.  Comfortable, alert   Heart: RRR Chest: clear bil.  No labored breathing or cough Abdomen: soft, slight distension, slight right upper/mid tenderness.  JP drainage now looking more yellow, like urine.  BS active  Extremities: amputations of LE Neuro/Psych:  Calm , pleasant, sociable.  No gross deficits   Intake/Output from previous day: 10/29 0701 - 10/30 0700 In: 789.9 [P.O.:640; IV Piggyback:149.9] Out: 1921 [Urine:1600; Drains:320; Stool:1]  Intake/Output this shift: Total I/O In: 600 [P.O.:600] Out: 800 [Urine:750; Drains:50]  Lab Results: Recent Labs    03/05/18 0402  WBC 13.4*  HGB 9.8*  HCT 32.9*  PLT 169   BMET Recent Labs    03/03/18 0404 03/04/18 0321 03/05/18 0402  NA 139 140 141  K 3.4* 3.9 3.7  CL 115* 117* 116*  CO2 19* 18* 20*  GLUCOSE 136* 114* 115*  BUN 61* 48* 35*  CREATININE 2.06* 1.70* 1.58*  CALCIUM 7.5* 7.6* 7.7*   LFT Recent Labs    03/03/18 0404 03/04/18 0321 03/05/18 0402  PROT 5.1*  --   --   ALBUMIN 1.9* 1.9* 1.9*  AST 56*  --   --   ALT 20  --   --   ALKPHOS 102  --   --   BILITOT 0.5  --   --    PT/INR Recent Labs    03/04/18 0321 03/05/18 0402  LABPROT 22.4* 21.4*  INR 1.99 1.89   Hepatitis Panel No results for input(s): HEPBSAG, HCVAB, HEPAIGM, HEPBIGM in the last 72  hours.  Studies/Results: Nm Hepatobiliary Liver Func  Result Date: 03/03/2018 CLINICAL DATA:  Status post cholecystectomy. Bile output from surgical drain. Evaluate for postop bile leak. EXAM: NUCLEAR MEDICINE HEPATOBILIARY IMAGING TECHNIQUE: Sequential images of the abdomen were obtained out to 60 minutes following intravenous administration of radiopharmaceutical. RADIOPHARMACEUTICALS:  5.2 mCi Tc-51m  Choletec IV COMPARISON:  02/26/2018 FINDINGS: Prompt uptake and biliary excretion of activity by the liver is seen. Biliary activity passes into small bowel, consistent with patent common bile duct. No gallbladder activity is seen, consistent with prior cholecystectomy. There is no evidence of leak or extravasation of biliary activity IMPRESSION: Normal study status post cholecystectomy. No evidence of postop bile leak or biliary obstruction. Electronically Signed   By: Earle Gell M.D.   On: 03/03/2018 17:52    ASSESMENT:   *   ? Bile leak after lap chole of necrotic GB.   WBCs continue to improve.  Clinically feels well.     JP drain output steadily declining, now does not look bilious  *   Chronic Coumadin on hold for last several days.    *    Normocytic anemia, stable Hgb.  Multifactorial in pt with numerous severe  acute and chronic diseases.    *    Endocarditis, osteomyelitis/discitis.     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     PLAN   *   ERCP set for 10/31 at 1330 with Dr Henrene Pastor, if drainage continues to decline, will this be necessary.    *  Dr Havery Moros and Henrene Pastor request Vitamin K 5 mg po now to correct INR to allow for ERCP tomorrow.  This is ordered.    NPO after MN for ERCP.      Azucena Freed  03/05/2018, 1:39 PM Phone [phone]

## 2018-03-05 NOTE — Progress Notes (Addendum)
Patient ID: Ryder Man, male   DOB: [birth year], 62 y.o.   MRN: [id number]    5 Days Post-Op  Subjective: Patient with no new complaints today.  Objective: Vital signs in last 24 hours: Temp:  [97.7 F (36.5 C)-98.2 F (36.8 C)] 97.7 F (36.5 C) (10/30 0728) Pulse Rate:  [79-85] 79 (10/30 0728) Resp:  [16-18] 18 (10/30 0728) BP: (127-158)/(68-82) 158/82 (10/30 0728) SpO2:  [100 %] 100 % (10/30 0728) Weight:  [105 kg] 105 kg (10/29 1049) Last BM Date: 03/04/18  Intake/Output from previous day: 10/29 0701 - 10/30 0700 In: 789.9 [P.O.:640; IV Piggyback:149.9] Out: 1921 [Urine:1600; Drains:320; Stool:1] Intake/Output this shift: Total I/O In: 300 [P.O.:300] Out: 350 [Urine:350]  PE: Abd: soft, appropriately tender, JP drain still with significant output, but certainly looks more serous today and less bilious.  Incisions are c/d/i  Lab Results:  Recent Labs    03/05/18 0402  WBC 13.4*  HGB 9.8*  HCT 32.9*  PLT 169   BMET Recent Labs    03/04/18 0321 03/05/18 0402  NA 140 141  K 3.9 3.7  CL 117* 116*  CO2 18* 20*  GLUCOSE 114* 115*  BUN 48* 35*  CREATININE 1.70* 1.58*  CALCIUM 7.6* 7.7*   PT/INR Recent Labs    03/04/18 0321 03/05/18 0402  LABPROT 22.4* 21.4*  INR 1.99 1.89   CMP     Component Value Date/Time   NA 141 03/05/2018 0402   K 3.7 03/05/2018 0402   CL 116 (H) 03/05/2018 0402   CO2 20 (L) 03/05/2018 0402   GLUCOSE 115 (H) 03/05/2018 0402   BUN 35 (H) 03/05/2018 0402   CREATININE 1.58 (H) 03/05/2018 0402   CALCIUM 7.7 (L) 03/05/2018 0402   PROT 5.1 (L) 03/03/2018 0404   ALBUMIN 1.9 (L) 03/05/2018 0402   AST 56 (H) 03/03/2018 0404   ALT 20 03/03/2018 0404   ALKPHOS 102 03/03/2018 0404   BILITOT 0.5 03/03/2018 0404   GFRNONAA 46 (L) 03/05/2018 0402   GFRAA 53 (L) 03/05/2018 0402   Lipase     Component Value Date/Time   LIPASE 23 01/14/2018 1926       Studies/Results: Nm Hepatobiliary Liver Func  Result Date:  03/03/2018 CLINICAL DATA:  Status post cholecystectomy. Bile output from surgical drain. Evaluate for postop bile leak. EXAM: NUCLEAR MEDICINE HEPATOBILIARY IMAGING TECHNIQUE: Sequential images of the abdomen were obtained out to 60 minutes following intravenous administration of radiopharmaceutical. RADIOPHARMACEUTICALS:  5.2 mCi Tc-35m Choletec IV COMPARISON:  02/26/2018 FINDINGS: Prompt uptake and biliary excretion of activity by the liver is seen. Biliary activity passes into small bowel, consistent with patent common bile duct. No gallbladder activity is seen, consistent with prior cholecystectomy. There is no evidence of leak or extravasation of biliary activity IMPRESSION: Normal study status post cholecystectomy. No evidence of postop bile leak or biliary obstruction. Electronically Signed   By: JEarle GellM.D.   On: 03/03/2018 17:52    Anti-infectives: Anti-infectives (From admission, onward)   Start     Dose/Rate Route Frequency Ordered Stop   03/13/18 1000  doxycycline (VIBRA-TABS) tablet 100 mg     100 mg Oral Every 12 hours 03/01/18 1217 04/10/18 0959   03/06/18 1800  DAPTOmycin (CUBICIN) 385 mg in sodium chloride 0.9 % IVPB  Status:  Discontinued     8 mg/kg  48.1 kg 215.4 mL/hr over 30 Minutes Intravenous Every 48 hours 03/01/18 1213 03/02/18 1158   03/05/18 2000  DAPTOmycin (CUBICIN) 385 mg  in sodium chloride 0.9 % IVPB  Status:  Discontinued     8 mg/kg  48.1 kg 215.4 mL/hr over 30 Minutes Intravenous Every 48 hours 03/02/18 1158 03/03/18 1322   03/05/18 2000  DAPTOmycin (CUBICIN) 850 mg in sodium chloride 0.9 % IVPB  Status:  Discontinued     850 mg 234 mL/hr over 30 Minutes Intravenous Daily 03/03/18 1322 03/04/18 1401   03/05/18 1800  DAPTOmycin (CUBICIN) 385 mg in sodium chloride 0.9 % IVPB  Status:  Discontinued     8 mg/kg  48.1 kg 215.4 mL/hr over 30 Minutes Intravenous Every 48 hours 03/01/18 1200 03/01/18 1213   03/04/18 1600  piperacillin-tazobactam (ZOSYN) IVPB  3.375 g     3.375 g 12.5 mL/hr over 240 Minutes Intravenous Every 8 hours 03/04/18 1401     03/03/18 1600  piperacillin-tazobactam (ZOSYN) IVPB 3.375 g  Status:  Discontinued     3.375 g 12.5 mL/hr over 240 Minutes Intravenous Every 8 hours 03/03/18 1230 03/04/18 1401   02/28/18 1530  piperacillin-tazobactam (ZOSYN) IVPB 3.375 g  Status:  Discontinued     3.375 g 12.5 mL/hr over 240 Minutes Intravenous Every 12 hours 02/28/18 1518 03/03/18 1230   02/26/18 2000  cefTRIAXone (ROCEPHIN) 2 g in sodium chloride 0.9 % 100 mL IVPB  Status:  Discontinued     2 g 200 mL/hr over 30 Minutes Intravenous Every 24 hours 02/26/18 1911 02/28/18 1500   02/26/18 2000  metroNIDAZOLE (FLAGYL) IVPB 500 mg  Status:  Discontinued     500 mg 100 mL/hr over 60 Minutes Intravenous Every 8 hours 02/26/18 1911 02/28/18 1500   02/24/18 0000  daptomycin (CUBICIN) IVPB  Status:  Discontinued     700 mg Intravenous Every 24 hours 02/24/18 1141 03/03/18    02/23/18 2000  DAPTOmycin (CUBICIN) 700 mg in sodium chloride 0.9 % IVPB  Status:  Discontinued     700 mg 228 mL/hr over 30 Minutes Intravenous Every 24 hours 02/23/18 1233 02/26/18 1911   02/13/18 1415  ceFAZolin (ANCEF) powder 2 g     2 g Other To Surgery 02/13/18 1410 02/13/18 2142   02/13/18 1347  ceFAZolin (ANCEF) 2-4 GM/100ML-% IVPB    Note to Pharmacy:  Arlean Hopping   : cabinet override      02/13/18 1347 02/13/18 1410   01/30/18 2000  DAPTOmycin (CUBICIN) 700 mg in sodium chloride 0.9 % IVPB  Status:  Discontinued     700 mg 228 mL/hr over 30 Minutes Intravenous Every 48 hours 01/30/18 1306 02/23/18 1233   01/30/18 1000  fluconazole (DIFLUCAN) tablet 50 mg     50 mg Oral Daily 01/29/18 0930 02/04/18 0914   01/29/18 2000  DAPTOmycin (CUBICIN) 700 mg in sodium chloride 0.9 % IVPB  Status:  Discontinued     700 mg 228 mL/hr over 30 Minutes Intravenous Every 48 hours 01/29/18 1013 01/30/18 1306   01/29/18 1030  fluconazole (DIFLUCAN) tablet 100 mg     100  mg Oral  Once 01/29/18 0930 01/29/18 1121   01/28/18 2200  ceFAZolin (ANCEF) IVPB 1 g/50 mL premix  Status:  Discontinued     1 g 100 mL/hr over 30 Minutes Intravenous Every 24 hours 01/28/18 1007 01/28/18 1231   01/27/18 2245  ceFAZolin (ANCEF) IVPB 2g/100 mL premix  Status:  Discontinued     2 g 200 mL/hr over 30 Minutes Intravenous Every 12 hours 01/27/18 2243 01/28/18 1007   01/27/18 2230  ceFAZolin (ANCEF) IVPB  Status:  Discontinued    Note to Pharmacy:  Indication:  MSSA bacteremia/endocarditis Last Day of Therapy:  03/08/18 Labs - Once weekly:  CBC/D and BMP, Labs - Every other week:  ESR and CRP     2 g Intravenous Every 8 hours 01/27/18 2220 01/27/18 2227   01/27/18 2230  ceFAZolin (ANCEF) IVPB 2g/100 mL premix  Status:  Discontinued     2 g 200 mL/hr over 30 Minutes Intravenous Every 8 hours 01/27/18 2227 01/27/18 2243       Assessment/Plan MSSA bacteremia Osteomyelitis of RLE, s/p BKA H/o L BKA Endocarditis ARF, just off HD ParoxysmalA fib- oncoumadin, INR2.5 DM-II PVD H/o MI  Acute, gangrenouscholecystitis POD#5-s/p laparoscopic cholecystectomy, placement19F blakedrainNinfa Linden -doing well this morning -JP drain has been frankly bilious over the last several days.  This appears less bilious today and more serous in nature.  Still with high output.  I would still be concerned about a leak going forward given bilious output over last several days, but unclear why drainage is more serous today.  Drain still put out over 400cc yesterday. -will discuss with Dr. Brantley Stage  ID -daptomycin 9/25-10/25, Zosyn 10/25 >>Doxycycline --> FEN - low fat VTE -couamdin, 1.89 Foley -none Follow up: Dr. Ninfa Linden   LOS: 68 days    Henreitta Cea , [facility] 03/05/2018, 10:47 AM Pager: [phone]

## 2018-03-05 NOTE — Progress Notes (Signed)
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 He was discharged to SNF and developed acute oliguria - presented to the ED where he was found to have AKI.  Developed acute respiratory failure necessitating transfer to ICU.  Temporary dialysis catheter was placed for HD. s/p lap cholecystectomy 10/25.   OT comments  Pt presents supine in bed pleasant and willing to participate in therapy session. Focus of session on UB strengthening/activity tolerance as precursor to performing ADLs/functional transfers. Issued pt level 1 theraband and pt performing UB exercises across multiple planes, corresponding HEP issued and reviewed. Feel POC remains appropriate at this time. Will continue to follow acutely.    Follow Up Recommendations  SNF;Supervision/Assistance - 24 hour    Equipment Recommendations  None recommended by OT          Precautions / Restrictions Precautions Precautions: Fall Precaution Comments: JP drain Required Braces or Orthoses: Other Brace/Splint Restrictions Weight Bearing Restrictions: No RLE Weight Bearing: Non weight bearing LLE Weight Bearing: Non weight bearing Other Position/Activity Restrictions: Has a prosthesis for LLE but cannot don at this time due to LLE edema                                                     ADL either performed or assessed with clinical judgement   ADL                                         General ADL Comments: focus of session on UB strengthening using level1 theraband                       Cognition Arousal/Alertness: Awake/alert Behavior During Therapy: WFL  for tasks assessed/performed Overall Cognitive Status: Within Functional Limits for tasks assessed                                          Exercises Exercises: General Upper Extremity General Exercises - Upper Extremity Shoulder Flexion: AROM;Theraband;Right;Left;10 reps Theraband Level (Shoulder Flexion): Level 1 (Yellow) Shoulder Extension: AROM;Right;Left;10 reps;Theraband Theraband Level (Shoulder Extension): Level 1 (Yellow) Shoulder Horizontal ABduction: AROM;10 reps;Left;Theraband Theraband Level (Shoulder Horizontal Abduction): Level 1 (Yellow) Shoulder Horizontal ADduction: AROM;10 reps;Left;Theraband Theraband Level (Shoulder Horizontal Adduction): Level 1 (Yellow) Elbow Flexion: AROM;Right;Left;10 reps;Theraband Theraband Level (Elbow Flexion): Level 1 (Yellow) Elbow Extension: AROM;10 reps;Right;Left;Theraband Theraband Level (Elbow Extension): Level 1 (Yellow) Amputee Exercises Chair Push Up: 10 reps Other Exercises Other Exercises: forward punches using level 1 theraband; theraband HEP issued   Shoulder Instructions       General Comments      Pertinent Vitals/ Pain       Pain Assessment: Faces Faces Pain Scale: Hurts little more Pain Location: drain site Pain Descriptors / Indicators: Discomfort;Sore Pain Intervention(s): Monitored during session  Home Living  Prior Functioning/Environment              Frequency  Min 2X/week        Progress Toward Goals  OT Goals(current goals can now be found in the care plan section)  Progress towards OT goals: Progressing toward goals  Acute Rehab OT Goals Patient Stated Goal: to feel better OT Goal Formulation: With patient Time For Goal Achievement: 03/06/18 Potential to Achieve Goals: Good ADL Goals Pt Will Perform Upper Body Bathing: with supervision Pt Will Perform Lower Body Bathing: with supervision;bed  level;sitting/lateral leans Pt Will Perform Upper Body Dressing: with supervision;sitting Pt Will Perform Lower Body Dressing: with supervision;sitting/lateral leans;bed level Pt Will Transfer to Toilet: with min assist;anterior/posterior transfer;bedside commode Pt Will Perform Toileting - Clothing Manipulation and hygiene: with supervision;sitting/lateral leans  Plan Discharge plan remains appropriate    Co-evaluation                 AM-PAC PT "6 Clicks" Daily Activity     Outcome Measure   Help from another person eating meals?: None Help from another person taking care of personal grooming?: A Little Help from another person toileting, which includes using toliet, bedpan, or urinal?: A Lot Help from another person bathing (including washing, rinsing, drying)?: A Lot Help from another person to put on and taking off regular upper body clothing?: A Lot Help from another person to put on and taking off regular lower body clothing?: A Lot 6 Click Score: 15    End of Session    OT Visit Diagnosis: Muscle weakness (generalized) (M62.81)   Activity Tolerance Patient tolerated treatment well   Patient Left in bed;with call bell/phone within reach;with nursing/sitter in room   Nurse Communication Mobility status        Time: 6283-1517 OT Time Calculation (min): 21 min  Charges: OT General Charges $OT Visit: 1 Visit OT Treatments $Therapeutic Exercise: 8-22 mins  [name], OT [facility] Pager [phone] Office [phone]   [name] 03/05/2018, 4:08 PM

## 2018-03-05 NOTE — Progress Notes (Signed)
PROGRESS NOTE  [name] EVO:[id number] DOB: 1955/12/12 DOA: 01/27/2018 PCP: [name], MD  HPI/Recap of past 24 hours:  62 y.o.malewith a history of sepsis due to right foot osteomyelitis s/p transtibial amputation and found to have MSSA bacteremia with splenic infarct and tricuspid valve endocarditis. He was discharged on ancef to [facility] but was quickly brought back in due to oliguria and found to have acute renal failure. He developed acute respiratory failure and transferred to ICU on CCM service. Antibiotics changed to daptomycin per ID and nephrology oversaw hemodialysis through right IJ temporary catheter on 10/12 and 10/14. With improvement in respiratory status he was transferred to the medical floor. Nephrology following closely for signs of renal recovery. Creatinine trending downward as of 10/17 with increasing urine output.Temporary HD catheter removed 10/21. On 10/22 the patient developed acute abdominal pain worse on the right with nausea, vomiting, and inability to take po. CT abd/pelvis demonstrated distended gallbladder with gallstones concerning for cholecystitis. Also showed anasarca, ascites, and pleural effusions, progressive T12-L1 changes noticed on previous study, and stable splenic lesions.  03/05/2018: Patient seen and examined at his bedside.  No acute events overnight.  Patient has no new complaints.  He denies any right upper quadrant pain or nausea.  General surgery following.  Possible ERCP by GI tomorrow.  N.p.o. after midnight.  Assessment/Plan: Principal Problem:   Acute renal failure (ARF) (HCC) Active Problems:   Insulin-requiring or dependent type II diabetes mellitus ([place])   Essential hypertension   CAD (coronary artery disease)   Leukocytosis   Thrombocytopenia (HCC)   S/P BKA (below knee amputation) unilateral, right (HCC)   Endocarditis of tricuspid valve   Hyponatremia   PAF (paroxysmal atrial fibrillation) (HCC)   Aortic  atherosclerosis (HCC)   Acute hypoxemic respiratory failure (HCC)   AKI (acute kidney injury) ([employer])   Acute gangrenous cholecystitis s/p lap cholecystectomy 02/28/2018  Oliguric AKI on CKD 3, improving Suspect secondary to ATN versus Ancef induced AIN. Baseline creatinine 1.5 with GFR of 86 Creatinine continues to improve Continue to avoid nephrotoxic agents Continue to encourage oral free water intake Repeat BMP in the morning  Acute gangrenous cholecystitis POD #5 status post laparoscopic cholecystectomy and placement of 19 F. Blake drain Still has high output Possible ERCP tomorrow 03/06/2018 GI and general surgery following  MSSA bacteremia and tricuspid valve endocarditis complicated by splenic infarcts with sepsis Continue doxycycline as recommended by infectious disease Continue to monitor fever curve and WBC Repeat CBC in the morning  T12-L1 discitis/osteomyelitis Continue doxycycline  Severe PVD status post bilateral transtibial amputations Plan for prosthetic fitting patient  Type 2 diabetes complicated by hyperglycemia Last A1c 8.5 [name] continue insulin sliding scale  Subtherapeutic INR INR 1.89 Repeat INR in the morning Continue Coumadin Pharmacy managing   Code Status: Full code  Family Communication: None at bedside  Disposition Plan: Home when clinically stable   Consultants:  General surgery  GI  Procedures:  Temporary HD catheter  Cholecystectomy 02/28/2018  Antimicrobials:  Doxycycline  DVT prophylaxis: Coumadin   Objective: Vitals:   03/04/18 1618 03/04/18 2019 03/05/18 0409 03/05/18 0728  BP: 134/73 127/68 (!) 141/72 (!) 158/82  Pulse: 81 85 85 79  Resp: 18 16 16 18   Temp: 98 F (36.7 C) 98.2 F (36.8 C) 98.1 F (36.7 C) 97.7 F (36.5 C)  TempSrc: Oral Oral Oral Oral  SpO2: 100% 100% 100% 100%  Weight:      Height:  Intake/Output Summary (Last 24 hours) at 03/05/2018 1236 Last data filed at 03/05/2018  1214 Gross per 24 hour  Intake 1084.44 ml  Output 2281 ml  Net -1196.56 ml   Filed Weights   03/02/18 2105 03/03/18 0457 03/04/18 1049  Weight: 108 kg 108 kg 105 kg    Exam:  . General: 62 y.o. year-old male well developed well nourished in no acute distress.  Alert and oriented x3. . Cardiovascular: Regular rate and rhythm with no rubs or gallops.  No thyromegaly or JVD noted.   Marland Kitchen Respiratory: Clear to auscultation with no wheezes or rales. Good inspiratory effort. . Abdomen: Soft nontender nondistended with normal bowel sounds x4 quadrants. . Musculoskeletal: Bilateral transtibial amputee. Marland Kitchen Psychiatry: Mood is appropriate for condition and setting   Data Reviewed: CBC: Recent Labs  Lab 02/28/18 0350 03/01/18 0340 03/02/18 0348 03/05/18 0402  WBC 17.1* 12.1* 12.6* 13.4*  HGB 8.6* 9.6* 9.1* 9.8*  HCT 27.6* 31.5* 29.6* 32.9*  MCV 88.7 89.5 90.2 92.4  PLT 230 242 192 637   Basic Metabolic Panel: Recent Labs  Lab 02/28/18 0350 03/01/18 0340 03/02/18 0342 03/03/18 0404 03/04/18 0321 03/05/18 0402  NA 134* 135 138 139 140 141  K 3.0* 4.0 3.0* 3.4* 3.9 3.7  CL 105 107 107 115* 117* 116*  CO2 19* 18* 18* 19* 18* 20*  GLUCOSE 80 267* 127* 136* 114* 115*  BUN 86* 80* 78* 61* 48* 35*  CREATININE 3.15* 2.77* 2.55* 2.06* 1.70* 1.58*  CALCIUM 7.4* 7.5* 7.6* 7.5* 7.6* 7.7*  MG  --   --  1.5*  --   --   --   PHOS 4.9* 5.7* 4.6  --  3.0 2.5   GFR: Estimated Creatinine Clearance: 59.6 mL/min (A) (by C-G formula based on SCr of 1.58 mg/dL (H)). Liver Function Tests: Recent Labs  Lab 02/27/18 0402  03/01/18 0340 03/02/18 0342 03/03/18 0404 03/04/18 0321 03/05/18 0402  AST 51*  --   --   --  56*  --   --   ALT 27  --   --   --  20  --   --   ALKPHOS 106  --   --   --  102  --   --   BILITOT 1.0  --   --   --  0.5  --   --   PROT 5.5*  --   --   --  5.1*  --   --   ALBUMIN 1.9*   < > 2.0* 1.8* 1.9* 1.9* 1.9*   < > = values in this interval not displayed.   No  results for input(s): LIPASE, AMYLASE in the last 168 hours. No results for input(s): AMMONIA in the last 168 hours. Coagulation Profile: Recent Labs  Lab 03/01/18 0340 03/02/18 0348 03/03/18 0404 03/04/18 0321 03/05/18 0402  INR 1.88 2.18 1.96 1.99 1.89   Cardiac Enzymes: Recent Labs  Lab 02/27/18 0402  CKTOTAL 169   BNP (last 3 results) No results for input(s): PROBNP in the last 8760 hours. HbA1C: No results for input(s): HGBA1C in the last 72 hours. CBG: Recent Labs  Lab 03/04/18 1134 03/04/18 1617 03/04/18 2123 03/05/18 0726 03/05/18 1100  GLUCAP 146* 169* 161* 96 137*   Lipid Profile: No results for input(s): CHOL, HDL, LDLCALC, TRIG, CHOLHDL, LDLDIRECT in the last 72 hours. Thyroid Function Tests: No results for input(s): TSH, T4TOTAL, FREET4, T3FREE, THYROIDAB in the last 72 hours. Anemia Panel: No results for input(s):  VITAMINB12, FOLATE, FERRITIN, TIBC, IRON, RETICCTPCT in the last 72 hours. Urine analysis:    Component Value Date/Time   COLORURINE YELLOW 02/28/2018 2241   APPEARANCEUR HAZY (A) 02/28/2018 2241   LABSPEC 1.011 02/28/2018 2241   PHURINE 5.0 02/28/2018 2241   GLUCOSEU NEGATIVE 02/28/2018 2241   HGBUR LARGE (A) 02/28/2018 2241   BILIRUBINUR NEGATIVE 02/28/2018 2241   KETONESUR NEGATIVE 02/28/2018 2241   PROTEINUR 100 (A) 02/28/2018 2241   UROBILINOGEN 0.2 07/28/2014 0433   NITRITE NEGATIVE 02/28/2018 2241   LEUKOCYTESUR MODERATE (A) 02/28/2018 2241   Sepsis Labs: @LABRCNTIP (procalcitonin:4,lacticidven:4)  ) Recent Results (from the past 240 hour(s))  Culture, Urine     Status: None   Collection Time: 02/28/18 10:34 AM  Result Value Ref Range Status   Specimen Description URINE, RANDOM  Final   Special Requests NONE  Final   Culture   Final    NO GROWTH Performed at [facility], [place] [address] [place], [place] [address]    Report Status 03/01/2018 FINAL  Final  Culture, Urine     Status: None   Collection Time:  02/28/18 10:34 PM  Result Value Ref Range Status   Specimen Description URINE, RANDOM  Final   Special Requests NONE  Final   Culture   Final    NO GROWTH Performed at [facility], [place] [address], [place], [place] [address]    Report Status 03/02/2018 FINAL  Final      Studies: No results found.  Scheduled Meds: . Chlorhexidine Gluconate Cloth  6 each Topical Q0600  . [START ON 03/13/2018] doxycycline  100 mg Oral Q12H  . insulin aspart  0-5 Units Subcutaneous QHS  . insulin aspart  0-9 Units Subcutaneous TID WC  . insulin aspart  4 Units Subcutaneous TID WC  . insulin glargine  22 Units Subcutaneous QHS  . latanoprost  1 drop Both Eyes QHS  . mouth rinse  15 mL Mouth Rinse BID  . metoprolol tartrate  50 mg Oral BID  . multivitamin  1 tablet Oral QHS  . nystatin   Topical BID  . pantoprazole  40 mg Oral Q1200  . saccharomyces boulardii  250 mg Oral BID  . traZODone  50 mg Oral QHS    Continuous Infusions: . sodium chloride 0 mL/hr at 02/28/18 1202  . sodium chloride    . piperacillin-tazobactam (ZOSYN)  IV 3.375 g (03/05/18 0859)     LOS: 14 days     Kayleen Memos, MD [facility] Pager [phone]  If 7PM-7AM, please contact night-coverage [email or web address] Password [email or web address] 03/05/2018, 12:36 PM

## 2018-03-06 ENCOUNTER — Encounter (HOSPITAL_COMMUNITY)
Admission: EM | Disposition: A | Discharge status: Skilled Nursing Facility | Payer: Self-pay | Source: Home / Self Care | Attending: Family Medicine

## 2018-03-06 ENCOUNTER — Encounter (HOSPITAL_COMMUNITY): Payer: Self-pay | Admitting: Anesthesiology

## 2018-03-06 ENCOUNTER — Inpatient Hospital Stay (HOSPITAL_COMMUNITY): Payer: Medicare Other

## 2018-03-06 ENCOUNTER — Inpatient Hospital Stay (HOSPITAL_COMMUNITY): Payer: Medicare Other | Admitting: Anesthesiology

## 2018-03-06 HISTORY — PX: SPHINCTEROTOMY: SHX5544

## 2018-03-06 HISTORY — PX: BILIARY STENT PLACEMENT: SHX5538

## 2018-03-06 HISTORY — PX: REMOVAL OF STONES: SHX5545

## 2018-03-06 HISTORY — PX: ERCP: SHX5425

## 2018-03-06 LAB — RENAL FUNCTION PANEL
ALBUMIN: 1.9 g/dL — AB (ref 3.5–5.0)
Anion gap: 6 (ref 5–15)
BUN: 27 mg/dL — ABNORMAL HIGH (ref 8–23)
CALCIUM: 7.8 mg/dL — AB (ref 8.9–10.3)
CO2: 19 mmol/L — AB (ref 22–32)
Chloride: 114 mmol/L — ABNORMAL HIGH (ref 98–111)
Creatinine, Ser: 1.43 mg/dL — ABNORMAL HIGH (ref 0.61–1.24)
GFR calc non Af Amer: 51 mL/min — ABNORMAL LOW (ref >60)
GFR, EST AFRICAN AMERICAN: 60 mL/min — AB (ref >60)
Glucose, Bld: 138 mg/dL — ABNORMAL HIGH (ref 70–99)
PHOSPHORUS: 2.6 mg/dL (ref 2.5–4.6)
Potassium: 3.6 mmol/L (ref 3.5–5.1)
SODIUM: 139 mmol/L (ref 135–145)

## 2018-03-06 LAB — CBC WITH DIFFERENTIAL/PLATELET
ABS IMMATURE GRANULOCYTES: 0.12 10*3/uL — AB (ref 0.00–0.07)
BASOS PCT: 0 %
Basophils Absolute: 0 10*3/uL (ref 0.0–0.1)
EOS ABS: 0.2 10*3/uL (ref 0.0–0.5)
Eosinophils Relative: 1 %
HCT: 36.1 % — ABNORMAL LOW (ref 39.0–52.0)
Hemoglobin: 10.7 g/dL — ABNORMAL LOW (ref 13.0–17.0)
Immature Granulocytes: 1 %
Lymphocytes Relative: 8 %
Lymphs Abs: 1 10*3/uL (ref 0.7–4.0)
MCH: 27.1 pg (ref 26.0–34.0)
MCHC: 29.6 g/dL — ABNORMAL LOW (ref 30.0–36.0)
MCV: 91.4 fL (ref 80.0–100.0)
MONO ABS: 0.8 10*3/uL (ref 0.1–1.0)
MONOS PCT: 6 %
NEUTROS ABS: 10 10*3/uL — AB (ref 1.7–7.7)
NRBC: 0 % (ref 0.0–0.2)
Neutrophils Relative %: 84 %
PLATELETS: 188 10*3/uL (ref 150–400)
RBC: 3.95 MIL/uL — AB (ref 4.22–5.81)
RDW: 15.9 % — AB (ref 11.5–15.5)
WBC: 12.1 10*3/uL — ABNORMAL HIGH (ref 4.0–10.5)

## 2018-03-06 LAB — BASIC METABOLIC PANEL
Anion gap: 4 — ABNORMAL LOW (ref 5–15)
BUN: 26 mg/dL — AB (ref 8–23)
CO2: 21 mmol/L — ABNORMAL LOW (ref 22–32)
Calcium: 7.9 mg/dL — ABNORMAL LOW (ref 8.9–10.3)
Chloride: 114 mmol/L — ABNORMAL HIGH (ref 98–111)
Creatinine, Ser: 1.4 mg/dL — ABNORMAL HIGH (ref 0.61–1.24)
GFR calc Af Amer: >60 mL/min (ref >60)
GFR, EST NON AFRICAN AMERICAN: 53 mL/min — AB (ref >60)
GLUCOSE: 140 mg/dL — AB (ref 70–99)
POTASSIUM: 3.7 mmol/L (ref 3.5–5.1)
SODIUM: 139 mmol/L (ref 135–145)

## 2018-03-06 LAB — PROTIME-INR
INR: 1.42
Prothrombin Time: 17.2 seconds — ABNORMAL HIGH (ref 11.4–15.2)

## 2018-03-06 LAB — GLUCOSE, CAPILLARY
GLUCOSE-CAPILLARY: 131 mg/dL — AB (ref 70–99)
GLUCOSE-CAPILLARY: 153 mg/dL — AB (ref 70–99)
Glucose-Capillary: 111 mg/dL — ABNORMAL HIGH (ref 70–99)
Glucose-Capillary: 144 mg/dL — ABNORMAL HIGH (ref 70–99)

## 2018-03-06 SURGERY — ERCP, WITH INTERVENTION IF INDICATED
Anesthesia: General

## 2018-03-06 MED ORDER — GLUCAGON HCL RDNA (DIAGNOSTIC) 1 MG IJ SOLR
INTRAMUSCULAR | Status: DC | PRN
  Administered 2018-03-06: .5 mg via INTRAVENOUS

## 2018-03-06 MED ORDER — WARFARIN SODIUM 2 MG PO TABS
2.0000 mg | ORAL_TABLET | Freq: Once | ORAL | Status: AC
  Administered 2018-03-06: 2 mg via ORAL
  Filled 2018-03-06: qty 1

## 2018-03-06 MED ORDER — GLUCAGON HCL RDNA (DIAGNOSTIC) 1 MG IJ SOLR
INTRAMUSCULAR | Status: AC
  Filled 2018-03-06: qty 1

## 2018-03-06 MED ORDER — ROCURONIUM BROMIDE 50 MG/5ML IV SOSY
PREFILLED_SYRINGE | INTRAVENOUS | Status: DC | PRN
  Administered 2018-03-06: 50 mg via INTRAVENOUS

## 2018-03-06 MED ORDER — PROPOFOL 10 MG/ML IV BOLUS
INTRAVENOUS | Status: DC | PRN
  Administered 2018-03-06: 120 mg via INTRAVENOUS

## 2018-03-06 MED ORDER — SUGAMMADEX SODIUM 200 MG/2ML IV SOLN
INTRAVENOUS | Status: DC | PRN
  Administered 2018-03-06: 200 mg via INTRAVENOUS

## 2018-03-06 MED ORDER — WARFARIN - PHARMACIST DOSING INPATIENT
Freq: Every day | Status: DC
  Administered 2018-03-06: 18:00:00

## 2018-03-06 MED ORDER — IOPAMIDOL (ISOVUE-300) INJECTION 61%
INTRAVENOUS | Status: AC
  Filled 2018-03-06: qty 50

## 2018-03-06 MED ORDER — SODIUM CHLORIDE 0.9 % IV SOLN
INTRAVENOUS | Status: DC | PRN
  Administered 2018-03-06: 50 ug/min via INTRAVENOUS

## 2018-03-06 MED ORDER — ONDANSETRON HCL 4 MG/2ML IJ SOLN
INTRAMUSCULAR | Status: DC | PRN
  Administered 2018-03-06: 4 mg via INTRAVENOUS

## 2018-03-06 MED ORDER — PHENYLEPHRINE 40 MCG/ML (10ML) SYRINGE FOR IV PUSH (FOR BLOOD PRESSURE SUPPORT)
PREFILLED_SYRINGE | INTRAVENOUS | Status: DC | PRN
  Administered 2018-03-06: 120 ug via INTRAVENOUS
  Administered 2018-03-06: 80 ug via INTRAVENOUS

## 2018-03-06 MED ORDER — INDOMETHACIN 50 MG RE SUPP
RECTAL | Status: AC
  Filled 2018-03-06: qty 2

## 2018-03-06 MED ORDER — LIDOCAINE 2% (20 MG/ML) 5 ML SYRINGE
INTRAMUSCULAR | Status: DC | PRN
  Administered 2018-03-06: 100 mg via INTRAVENOUS

## 2018-03-06 MED ORDER — FENTANYL CITRATE (PF) 100 MCG/2ML IJ SOLN
INTRAMUSCULAR | Status: DC | PRN
  Administered 2018-03-06: 50 ug via INTRAVENOUS

## 2018-03-06 NOTE — Anesthesia Procedure Notes (Signed)
Procedure Name: Intubation Date/Time: 03/06/2018 2:02 PM Performed by: Kyung Rudd, CRNA Pre-anesthesia Checklist: Patient identified, Emergency Drugs available, Suction available, Patient being monitored and Timeout performed Patient Re-evaluated:Patient Re-evaluated prior to induction Oxygen Delivery Method: Circle system utilized Preoxygenation: Pre-oxygenation with 100% oxygen Induction Type: IV induction Ventilation: Mask ventilation without difficulty Laryngoscope Size: Mac and 3 Grade View: Grade I Tube type: Oral Tube size: 7.5 mm Number of attempts: 1 Airway Equipment and Method: Stylet Placement Confirmation: ETT inserted through vocal cords under direct vision,  positive ETCO2 and breath sounds checked- equal and bilateral Secured at: 21 cm Tube secured with: Tape Dental Injury: Teeth and Oropharynx as per pre-operative assessment

## 2018-03-06 NOTE — Anesthesia Postprocedure Evaluation (Signed)
Anesthesia Post Note  Patient: [name]  Procedure(s) Performed: ENDOSCOPIC RETROGRADE CHOLANGIOPANCREATOGRAPHY (ERCP) (N/A ) SPHINCTEROTOMY REMOVAL OF STONES BILIARY STENT PLACEMENT     Patient location during evaluation: PACU Anesthesia Type: General Level of consciousness: awake and alert Pain management: pain level controlled Vital Signs Assessment: post-procedure vital signs reviewed and stable Respiratory status: spontaneous breathing, nonlabored ventilation and respiratory function stable Cardiovascular status: blood pressure returned to baseline and stable Postop Assessment: no apparent nausea or vomiting Anesthetic complications: no    Last Vitals:  Vitals:   03/06/18 1540 03/06/18 1604  BP: 122/68 139/73  Pulse: 83 82  Resp: (!) 21 20  Temp:  36.5 C  SpO2: 100% 98%    Last Pain:  Vitals:   03/06/18 1604  TempSrc: Oral  PainSc:                  [name]

## 2018-03-06 NOTE — Anesthesia Preprocedure Evaluation (Addendum)
Anesthesia Evaluation  Patient identified by MRN, date of birth, ID band Patient awake    Reviewed: Allergy & Precautions, NPO status , Patient's Chart, lab work & pertinent test results  History of Anesthesia Complications Negative for: history of anesthetic complications  Airway Mallampati: II  TM Distance: >3 FB Neck ROM: Full    Dental  (+) Dental Advisory Given, Poor Dentition, Missing   Pulmonary former smoker,    breath sounds clear to auscultation       Cardiovascular hypertension, Pt. on medications and Pt. on home beta blockers (-) angina+ CAD, + Past MI, + CABG and + Peripheral Vascular Disease  + Valvular Problems/Murmurs  Rhythm:Regular Rate:Normal   '19 TEE - EF 50% to 55%. An AV prosthesis was present and functioning normally. There was redundancy of the atrial septum, with borderline criteria for aneurysm. There was a tricuspid vegetation.   Neuro/Psych negative neurological ROS  negative psych ROS   GI/Hepatic Neg liver ROS, GERD  Medicated and Controlled,  Endo/Other  diabetes, Insulin Dependent Obesity   Renal/GU CRFRenal disease     Musculoskeletal  (+) Arthritis ,   Abdominal   Peds  Hematology  (+) anemia ,   Anesthesia Other Findings   Reproductive/Obstetrics                            Anesthesia Physical Anesthesia Plan  ASA: III  Anesthesia Plan: General   Post-op Pain Management:    Induction: Intravenous  PONV Risk Score and Plan: 2 and Treatment may vary due to age or medical condition, Ondansetron and Dexamethasone  Airway Management Planned: Oral ETT  Additional Equipment: None  Intra-op Plan:   Post-operative Plan: Extubation in OR  Informed Consent: I have reviewed the patients History and Physical, chart, labs and discussed the procedure including the risks, benefits and alternatives for the proposed anesthesia with the patient or authorized  representative who has indicated his/her understanding and acceptance.   Dental advisory given  Plan Discussed with: CRNA and Anesthesiologist  Anesthesia Plan Comments:        Anesthesia Quick Evaluation

## 2018-03-06 NOTE — Progress Notes (Addendum)
Pharmacy Antibiotic Note  [name] is a 62 y.o. male continues on Zosyn since 10/25 for gangrenous cholecystitis.  Previously on Daptomycin 9/25>>10/25 for MSSA bacteremia and endocarditis, which is on hold while on Zosyn.  Prior plan to resume Daptomycin on 10/30, but now to continue Zosyn until abdominal issues have resolved.  S/p ERCP and stent today.   Coumadin for atrial fibrillation resumed on 10/27 with small dose of 0.5 mg then held for ERCP.  Vitamin K 5 mg PO given on 10/30 and INR down to 1.42 today and okay to resume Coumadin tonight per GI.  Previously therapeutic on Coumadin but some sensitivity, with INR trending >3 on 2.5 mg daily.     Plan: Continue Zosyn 3.375 gm IV q8hrs (each infused over 4 hours) Prior plan to stop IV antibiotics on 11/6 and then begin oral Doxy x 4 weeks on 11/7. Will follow up for antibiotic plans. Coumadin 2 mg x 1 tonight. Continue daily PT/INR.  Height: 5\' 10"  (177.8 cm) Weight: 231 lb 7.7 oz (105 kg) IBW/kg (Calculated) : 73  Temp (24hrs), Avg:98.3 F (36.8 C), Min:97.6 F (36.4 C), Max:99.1 F (37.3 C)  Recent Labs  Lab 02/28/18 0350 03/01/18 0340  03/02/18 0348 03/03/18 0404 03/04/18 0321 03/05/18 0402 03/06/18 0435 03/06/18 0605  WBC 17.1* 12.1*  --  12.6*  --   --  13.4*  --  12.1*  CREATININE 3.15* 2.77*   < >  --  2.06* 1.70* 1.58* 1.43* 1.40*   < > = values in this interval not displayed.    Estimated Creatinine Clearance: 67.2 mL/min (A) (by C-G formula based on SCr of 1.4 mg/dL (H)).    Allergies  Allergen Reactions  . Ace Inhibitors Cough  . Cefazolin     Possible cause of AKI, AIN   Antibiotics this admission:  Daptomycin 9/25>>10/25  Zosyn 10/25>> (11/6)  Doxycycline oral 11/7>>(12/4)   Arty Baumgartner, RPh Pager: [phone] or phone: [phone] 03/06/2018 3:57 PM

## 2018-03-06 NOTE — Progress Notes (Signed)
Patient ID: [name], male   DOB: 09-02-1955, 62 y.o.   MRN: [id number]    6 Days Post-Op  Subjective: Pt with no new complaints.  Drain still with fairly high output.  Objective: Vital signs in last 24 hours: Temp:  [97.6 F (36.4 C)-98.6 F (37 C)] 98.6 F (37 C) (10/31 0936) Pulse Rate:  [81-88] 87 (10/31 0936) Resp:  [18-19] 19 (10/31 0936) BP: (136-164)/(73-80) 153/73 (10/31 0936) SpO2:  [98 %-100 %] 98 % (10/31 0936) Weight:  [105 kg] 105 kg (10/30 2050) Last BM Date: 03/05/18  Intake/Output from previous day: 10/30 0701 - 10/31 0700 In: 1115.1 [P.O.:940; IV Piggyback:100.1] Out: 2270 [Urine:2150; Drains:120] Intake/Output this shift: Total I/O In: 125 [Other:125] Out: 300 [Urine:300]  PE: Abd: soft, appropriately tender, +BS, incisions are c/d/i.  Drain with serous output today, but brighter yellow than yesterday, ? Slight bile staining, but certainly not as frank as a couple of days ago.  Lab Results:  Recent Labs    03/05/18 0402 03/06/18 0605  WBC 13.4* 12.1*  HGB 9.8* 10.7*  HCT 32.9* 36.1*  PLT 169 188   BMET Recent Labs    03/06/18 0435 03/06/18 0605  NA 139 139  K 3.6 3.7  CL 114* 114*  CO2 19* 21*  GLUCOSE 138* 140*  BUN 27* 26*  CREATININE 1.43* 1.40*  CALCIUM 7.8* 7.9*   PT/INR Recent Labs    03/05/18 0402 03/06/18 0435  LABPROT 21.4* 17.2*  INR 1.89 1.42   CMP     Component Value Date/Time   NA 139 03/06/2018 0605   K 3.7 03/06/2018 0605   CL 114 (H) 03/06/2018 0605   CO2 21 (L) 03/06/2018 0605   GLUCOSE 140 (H) 03/06/2018 0605   BUN 26 (H) 03/06/2018 0605   CREATININE 1.40 (H) 03/06/2018 0605   CALCIUM 7.9 (L) 03/06/2018 0605   PROT 5.1 (L) 03/03/2018 0404   ALBUMIN 1.9 (L) 03/06/2018 0435   AST 56 (H) 03/03/2018 0404   ALT 20 03/03/2018 0404   ALKPHOS 102 03/03/2018 0404   BILITOT 0.5 03/03/2018 0404   GFRNONAA 53 (L) 03/06/2018 0605   GFRAA >60 03/06/2018 0605   Lipase     Component Value Date/Time   LIPASE 23 01/14/2018 1926       Studies/Results: No results found.  Anti-infectives: Anti-infectives (From admission, onward)   Start     Dose/Rate Route Frequency Ordered Stop   03/13/18 1000  doxycycline (VIBRA-TABS) tablet 100 mg     100 mg Oral Every 12 hours 03/01/18 1217 04/10/18 0959   03/06/18 1800  DAPTOmycin (CUBICIN) 385 mg in sodium chloride 0.9 % IVPB  Status:  Discontinued     8 mg/kg  48.1 kg 215.4 mL/hr over 30 Minutes Intravenous Every 48 hours 03/01/18 1213 03/02/18 1158   03/05/18 2000  DAPTOmycin (CUBICIN) 385 mg in sodium chloride 0.9 % IVPB  Status:  Discontinued     8 mg/kg  48.1 kg 215.4 mL/hr over 30 Minutes Intravenous Every 48 hours 03/02/18 1158 03/03/18 1322   03/05/18 2000  DAPTOmycin (CUBICIN) 850 mg in sodium chloride 0.9 % IVPB  Status:  Discontinued     850 mg 234 mL/hr over 30 Minutes Intravenous Daily 03/03/18 1322 03/04/18 1401   03/05/18 1800  DAPTOmycin (CUBICIN) 385 mg in sodium chloride 0.9 % IVPB  Status:  Discontinued     8 mg/kg  48.1 kg 215.4 mL/hr over 30 Minutes Intravenous Every 48 hours 03/01/18 1200 03/01/18 1213  03/04/18 1600  piperacillin-tazobactam (ZOSYN) IVPB 3.375 g     3.375 g 12.5 mL/hr over 240 Minutes Intravenous Every 8 hours 03/04/18 1401     03/03/18 1600  piperacillin-tazobactam (ZOSYN) IVPB 3.375 g  Status:  Discontinued     3.375 g 12.5 mL/hr over 240 Minutes Intravenous Every 8 hours 03/03/18 1230 03/04/18 1401   02/28/18 1530  piperacillin-tazobactam (ZOSYN) IVPB 3.375 g  Status:  Discontinued     3.375 g 12.5 mL/hr over 240 Minutes Intravenous Every 12 hours 02/28/18 1518 03/03/18 1230   02/26/18 2000  cefTRIAXone (ROCEPHIN) 2 g in sodium chloride 0.9 % 100 mL IVPB  Status:  Discontinued     2 g 200 mL/hr over 30 Minutes Intravenous Every 24 hours 02/26/18 1911 02/28/18 1500   02/26/18 2000  metroNIDAZOLE (FLAGYL) IVPB 500 mg  Status:  Discontinued     500 mg 100 mL/hr over 60 Minutes Intravenous Every 8  hours 02/26/18 1911 02/28/18 1500   02/24/18 0000  daptomycin (CUBICIN) IVPB  Status:  Discontinued     700 mg Intravenous Every 24 hours 02/24/18 1141 03/03/18    02/23/18 2000  DAPTOmycin (CUBICIN) 700 mg in sodium chloride 0.9 % IVPB  Status:  Discontinued     700 mg 228 mL/hr over 30 Minutes Intravenous Every 24 hours 02/23/18 1233 02/26/18 1911   02/13/18 1415  ceFAZolin (ANCEF) powder 2 g     2 g Other To Surgery 02/13/18 1410 02/13/18 2142   02/13/18 1347  ceFAZolin (ANCEF) 2-4 GM/100ML-% IVPB    Note to Pharmacy:  Arlean Hopping   : cabinet override      02/13/18 1347 02/13/18 1410   01/30/18 2000  DAPTOmycin (CUBICIN) 700 mg in sodium chloride 0.9 % IVPB  Status:  Discontinued     700 mg 228 mL/hr over 30 Minutes Intravenous Every 48 hours 01/30/18 1306 02/23/18 1233   01/30/18 1000  fluconazole (DIFLUCAN) tablet 50 mg     50 mg Oral Daily 01/29/18 0930 02/04/18 0914   01/29/18 2000  DAPTOmycin (CUBICIN) 700 mg in sodium chloride 0.9 % IVPB  Status:  Discontinued     700 mg 228 mL/hr over 30 Minutes Intravenous Every 48 hours 01/29/18 1013 01/30/18 1306   01/29/18 1030  fluconazole (DIFLUCAN) tablet 100 mg     100 mg Oral  Once 01/29/18 0930 01/29/18 1121   01/28/18 2200  ceFAZolin (ANCEF) IVPB 1 g/50 mL premix  Status:  Discontinued     1 g 100 mL/hr over 30 Minutes Intravenous Every 24 hours 01/28/18 1007 01/28/18 1231   01/27/18 2245  ceFAZolin (ANCEF) IVPB 2g/100 mL premix  Status:  Discontinued     2 g 200 mL/hr over 30 Minutes Intravenous Every 12 hours 01/27/18 2243 01/28/18 1007   01/27/18 2230  ceFAZolin (ANCEF) IVPB  Status:  Discontinued    Note to Pharmacy:  Indication:  MSSA bacteremia/endocarditis Last Day of Therapy:  03/08/18 Labs - Once weekly:  CBC/D and BMP, Labs - Every other week:  ESR and CRP     2 g Intravenous Every 8 hours 01/27/18 2220 01/27/18 2227   01/27/18 2230  ceFAZolin (ANCEF) IVPB 2g/100 mL premix  Status:  Discontinued     2 g 200 mL/hr  over 30 Minutes Intravenous Every 8 hours 01/27/18 2227 01/27/18 2243       Assessment/Plan MSSA bacteremia Osteomyelitis of RLE, s/p BKA H/o L BKA Endocarditis ARF, just off HD ParoxysmalA fib- oncoumadin, INR2.5 DM-II PVD  H/o MI  Acute, gangrenouscholecystitis POD#6-s/p laparoscopic cholecystectomy, placement19F blakedrainNinfa Linden -doing well this morning -JP drain still looks serous, but a bit brighter yellow today than yesterday. ? Slight bile tinge.  Unclear whether patient still has bile leak, but d/w Dr. Brantley Stage and GI and will plan to still pursue ERCP today for evaluation given history.  ID -daptomycin 9/25-10/25, Zosyn 10/25 >>Doxycycline --> FEN - NPO for procedure VTE -couamdin, 1.42 Foley -none Follow up: Dr. Ninfa Linden   LOS: 38 days    Henreitta Cea , [facility] Surgery 03/06/2018, 10:48 AM Pager: [phone]

## 2018-03-06 NOTE — Op Note (Addendum)
[facility] Patient Name: [name] Procedure Date : 03/06/2018 MRN: [id number] Attending MD: Docia Chuck. Henrene Pastor , MD Date of Birth: 05/06/1956 CSN: [id number] Age: 62 Admit Type: Inpatient Procedure:                ERCP with biliary sphincterotomy and biliary stent                            placement Indications:              Bile leak Providers:                Docia Chuck. Henrene Pastor, MD, Vista Lawman, RN, Charolette Child,                            Technician, Rhae Lerner, CRNA Referring MD:             General surgery Medicines:                General Anesthesia Complications:            No immediate complications. Estimated Blood Loss:     Estimated blood loss: none. Procedure:                Pre-Anesthesia Assessment:                           - Prophylactic Antibiotics: Provided.                           After obtaining informed consent, the scope was                            passed under direct vision. Throughout the                            procedure, the patient's blood pressure, pulse, and                            oxygen saturations were monitored continuously. The                            [id number]-[id number] ([id number]) Olympus Duodensocope was                            introduced through the mouth, and used to inject                            contrast into and used to inject contrast into the                            bile duct and ventral pancreatic duct. Scope In: Scope Out: Findings:      1. Esophagus was not examined. The stomach and duodenum were       unremarkable as was the major ampulla. The minor ampulla was not sought.      2. Scalp film was obtained demonstrating cholecystectomy clip and       drainage catheter      3. Initial injection  of contrast yielded a normal partial pancreatogram.      4. The common bile duct was cannulated with a hydrophilic wire. An       occlusion balloon demonstrated a normal biliary tree except for a bile       duct leak  cystic duct. Biliary sphincterotomy was made with a traction       (standard) sphincterotome using ERBE electrocautery. There was no       post-sphincterotomy bleeding. One 10 Fr by 5 cm transpapillary plastic       stent with a single external flap and a single internal flap was placed       into the common bile duct. Bile flowed through the stent. The stent was       in good position. Impression:               - Normal upper GI tract.                           - The major papilla appeared normal.                           - A bile leak was found.                           - A biliary sphincterotomy was performed.                           - One plastic stent was placed into the common bile                            duct. Moderate Sedation:      none Recommendation:           - Repeat ERCP in 6 to 8 weeks to assess for                            resolution of leak (assuming percutaneous drainage                            ceases and patient is asymptomatic).                           -Standard post ERCP monitoring including the                            medicine suppositories                           - Inpatient GI team to resume care. They are aware                            of results from today's intervention Procedure Code(s):        --- Professional ---                           [phone], Endoscopic retrograde  cholangiopancreatography (ERCP); with placement of                            endoscopic stent into biliary or pancreatic duct,                            including pre- and post-dilation and guide wire                            passage, when performed, including sphincterotomy,                            when performed, each stent Diagnosis Code(s):        --- Professional ---                           K83.9, Disease of biliary tract, unspecified                           K83.8, Other specified diseases of biliary tract CPT copyright 2018  American Medical Association. All rights reserved. The codes documented in this report are preliminary and upon coder review may  be revised to meet current compliance requirements. Docia Chuck. Henrene Pastor, MD 03/06/2018 3:17:20 PM This report has been signed electronically. Number of Addenda: 0

## 2018-03-06 NOTE — Progress Notes (Signed)
Patient doesn't want to eat dinner. I'm not giving patient's insulin coverage tonight. Will continue to monitor. Patient's blood sugar is: 153 at this time.   Farley Ly RN

## 2018-03-06 NOTE — Interval H&P Note (Signed)
History and Physical Interval Note:  03/06/2018 1:19 PM  [name]  has presented today for surgery, with the diagnosis of bile leak  The various methods of treatment have been discussed with the patient and family. After consideration of risks, benefits and other options for treatment, the patient has consented to  Procedure(s): ENDOSCOPIC RETROGRADE CHOLANGIOPANCREATOGRAPHY (ERCP) (N/A) as a surgical intervention .  The patient's history has been reviewed, patient examined, no change in status, stable for surgery.  I have reviewed the patient's chart and labs.  Questions were answered to the patient's satisfaction.     Scarlette Shorts

## 2018-03-06 NOTE — Progress Notes (Signed)
PT Cancellation Note  Patient Details Name: [name] MRN: [id number] DOB: [birth year]   Cancelled Treatment:    Reason Eval/Treat Not Completed: Patient at procedure or test/unavailable. Pt currently off unit. Will continue to follow and progress as able per POC.    [name] 03/06/2018, 1:23 PM   [name], PT, DPT Acute Rehabilitation Services Pager: [phone] Office: [phone]

## 2018-03-06 NOTE — H&P (View-Only) (Signed)
      Progress Note   Subjective  Patient continues to have a fair amount of drainage from surgical drain although contents mostly serous, previous was clearly bile. Not much pain. Denies any cardiopulmonary complaints.   Objective   Vital signs in last 24 hours: Temp:  [97.6 F (36.4 C)-98.2 F (36.8 C)] 98.2 F (36.8 C) (10/31 0505) Pulse Rate:  [81-88] 82 (10/31 0505) Resp:  [18] 18 (10/31 0505) BP: (136-164)/(74-80) 164/79 (10/31 0505) SpO2:  [99 %-100 %] 99 % (10/31 0505) Weight:  [105 kg] 105 kg (10/30 2050) Last BM Date: 03/05/18 General:    white male in NAD Heart:  Regular rate and rhythm;  Lungs: Respirations even and unlabored,  Abdomen:  Soft, nontender, drain in RUQ, drain is full with serous appearing fluid Extremities:  Amputations of LE Neurologic:  Alert and oriented,  grossly normal neurologically. Psych:  Cooperative. Normal mood and affect.  Intake/Output from previous day: 10/30 0701 - 10/31 0700 In: 1115.1 [P.O.:940; IV Piggyback:100.1] Out: 2270 [Urine:2150; Drains:120] Intake/Output this shift: No intake/output data recorded.  Lab Results: Recent Labs    03/05/18 0402 03/06/18 0605  WBC 13.4* 12.1*  HGB 9.8* 10.7*  HCT 32.9* 36.1*  PLT 169 188   BMET Recent Labs    03/05/18 0402 03/06/18 0435 03/06/18 0605  NA 141 139 139  K 3.7 3.6 3.7  CL 116* 114* 114*  CO2 20* 19* 21*  GLUCOSE 115* 138* 140*  BUN 35* 27* 26*  CREATININE 1.58* 1.43* 1.40*  CALCIUM 7.7* 7.8* 7.9*   LFT Recent Labs    03/06/18 0435  ALBUMIN 1.9*   PT/INR Recent Labs    03/05/18 0402 03/06/18 0435  LABPROT 21.4* 17.2*  INR 1.89 1.42    Studies/Results: No results found.     Assessment / Plan:   62 y/o male with multiple medical problems - history of osteomyelitis / endocarditis with MSSA bacteremia, on chronic coumadin for atrial fibrillation, s/p lap chole for gangrenous gallbladder whose had high output from JP drain post surgery concerning  for a leak. Initially he clearly had a significant amount of bile coming into the drain, despite HIDA showing no leak. Over the past day the fluid is more serous but continues to have ongoing output.   We have discussed his case with surgical colleagues and advanced endoscopy. There was high concern for a leak given the state of his gallbladder during surgery. He is scheduled for an ERCP this afternoon with Dr. Henrene Pastor to evaluate the bile duct, ensure no leak, and place stent if warranted. He was given vitamin K yesterday, his INR today is 1.4. He is NPO and on antibiotics. I have discussed his case again with surgery this morning, they wish to proceed with ERCP. I have discussed the risks / benefits of ERCP with the patient and he wishes to proceed. Further recommendations pending the results.   Florence Cellar, MD [facility]

## 2018-03-06 NOTE — Progress Notes (Signed)
      Progress Note   Subjective  Patient continues to have a fair amount of drainage from surgical drain although contents mostly serous, previous was clearly bile. Not much pain. Denies any cardiopulmonary complaints.   Objective   Vital signs in last 24 hours: Temp:  [97.6 F (36.4 C)-98.2 F (36.8 C)] 98.2 F (36.8 C) (10/31 0505) Pulse Rate:  [81-88] 82 (10/31 0505) Resp:  [18] 18 (10/31 0505) BP: (136-164)/(74-80) 164/79 (10/31 0505) SpO2:  [99 %-100 %] 99 % (10/31 0505) Weight:  [105 kg] 105 kg (10/30 2050) Last BM Date: 03/05/18 General:    white male in NAD Heart:  Regular rate and rhythm;  Lungs: Respirations even and unlabored,  Abdomen:  Soft, nontender, drain in RUQ, drain is full with serous appearing fluid Extremities:  Amputations of LE Neurologic:  Alert and oriented,  grossly normal neurologically. Psych:  Cooperative. Normal mood and affect.  Intake/Output from previous day: 10/30 0701 - 10/31 0700 In: 1115.1 [P.O.:940; IV Piggyback:100.1] Out: 2270 [Urine:2150; Drains:120] Intake/Output this shift: No intake/output data recorded.  Lab Results: Recent Labs    03/05/18 0402 03/06/18 0605  WBC 13.4* 12.1*  HGB 9.8* 10.7*  HCT 32.9* 36.1*  PLT 169 188   BMET Recent Labs    03/05/18 0402 03/06/18 0435 03/06/18 0605  NA 141 139 139  K 3.7 3.6 3.7  CL 116* 114* 114*  CO2 20* 19* 21*  GLUCOSE 115* 138* 140*  BUN 35* 27* 26*  CREATININE 1.58* 1.43* 1.40*  CALCIUM 7.7* 7.8* 7.9*   LFT Recent Labs    03/06/18 0435  ALBUMIN 1.9*   PT/INR Recent Labs    03/05/18 0402 03/06/18 0435  LABPROT 21.4* 17.2*  INR 1.89 1.42    Studies/Results: No results found.     Assessment / Plan:   62 y/o male with multiple medical problems - history of osteomyelitis / endocarditis with MSSA bacteremia, on chronic coumadin for atrial fibrillation, s/p lap chole for gangrenous gallbladder whose had high output from JP drain post surgery concerning  for a leak. Initially he clearly had a significant amount of bile coming into the drain, despite HIDA showing no leak. Over the past day the fluid is more serous but continues to have ongoing output.   We have discussed his case with surgical colleagues and advanced endoscopy. There was high concern for a leak given the state of his gallbladder during surgery. He is scheduled for an ERCP this afternoon with Dr. Henrene Pastor to evaluate the bile duct, ensure no leak, and place stent if warranted. He was given vitamin K yesterday, his INR today is 1.4. He is NPO and on antibiotics. I have discussed his case again with surgery this morning, they wish to proceed with ERCP. I have discussed the risks / benefits of ERCP with the patient and he wishes to proceed. Further recommendations pending the results.    Cellar, MD [facility]

## 2018-03-06 NOTE — Progress Notes (Signed)
PROGRESS NOTE  [name] HER:[id number] DOB: 08-Mar-1956 DOA: 01/27/2018 PCP: [name], MD  HPI/Recap of past 24 hours:  62 y.o.malewith a history of sepsis due to right foot osteomyelitis s/p transtibial amputation and found to have MSSA bacteremia with splenic infarct and tricuspid valve endocarditis. He was discharged on ancef to [facility] but was quickly brought back in due to oliguria and found to have acute renal failure. He developed acute respiratory failure and transferred to ICU on CCM service. Antibiotics changed to daptomycin per ID and nephrology oversaw hemodialysis through right IJ temporary catheter on 10/12 and 10/14. With improvement in respiratory status he was transferred to the medical floor. Nephrology following closely for signs of renal recovery. Creatinine trending downward as of 10/17 with increasing urine output.Temporary HD catheter removed 10/21. On 10/22 the patient developed acute abdominal pain worse on the right with nausea, vomiting, and inability to take po. CT abd/pelvis demonstrated distended gallbladder with gallstones concerning for cholecystitis. Also showed anasarca, ascites, and pleural effusions, progressive T12-L1 changes noticed on previous study, and stable splenic lesions.  03/05/2018: Patient seen and examined at his bedside.  No acute events overnight.  Patient has no new complaints.  He denies any right upper quadrant pain or nausea.  General surgery following.  Possible ERCP by GI tomorrow.  N.p.o. after midnight.  03/06/18: seen and examined with brother at bedside. No significant pain from ruq abd. No nausea. Still significant output from surgical drain. ERCP planned this afternoon.  Assessment/Plan: Principal Problem:   Acute renal failure (ARF) (HCC) Active Problems:   Insulin-requiring or dependent type II diabetes mellitus ([place])   Essential hypertension   CAD (coronary artery disease)   Leukocytosis  Thrombocytopenia (HCC)   S/P BKA (below knee amputation) unilateral, right (HCC)   Endocarditis of tricuspid valve   Hyponatremia   PAF (paroxysmal atrial fibrillation) (HCC)   Aortic atherosclerosis (HCC)   Acute hypoxemic respiratory failure (HCC)   AKI (acute kidney injury) ([place])   Acute gangrenous cholecystitis s/p lap cholecystectomy 02/28/2018   Bile leak  Oliguric AKI on CKD 3, improving Continues to improve Suspect secondary to ATN versus Ancef induced AIN.\ Appears to be at baseline Cr 1.4 with GFR 53 Continue to avoid nephrotoxic agents IV fluid hydration NS at 75cc/hr Repeat bmp in the am  Acute gangrenous cholecystitis POD #6 status post laparoscopic cholecystectomy and placement of 19 F. 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 with sepsis Continue doxycycline as recommended by infectious disease Continue to monitor fever curve and WBC Repeat CBC in the morning  T12-L1 discitis/osteomyelitis Continue doxycycline  Severe PVD status post bilateral transtibial amputations Plan for prosthetic fitting Fall precaution  Type 2 diabetes complicated by hyperglycemia Last A1c 8.5  continue insulin sliding scale  Subtherapeutic INR INR 1.4 coumadin was reversed Repeat INR in the morning Continue to hold coumadin until no further planned procedures   Code Status: Full code  Family Communication: None at bedside  Disposition Plan: Home when clinically stable   Consultants:  General surgery  GI  Procedures:  Temporary HD catheter  Cholecystectomy 02/28/2018  Antimicrobials:  Doxycycline  DVT  prophylaxis: scds   Objective: Vitals:   03/05/18 2050  03/06/18 0505 03/06/18 0936 03/06/18 1300  BP: (!) 142/74 (!) 164/79 (!) 153/73 135/77  Pulse: 88 82 87 79  Resp: 18 18 19  (!) 21  Temp: 97.9 F (36.6 C) 98.2 F (36.8 C) 98.6 F (37 C) 98.2 F (36.8 C)  TempSrc: Oral Oral Oral Oral  SpO2: 99% 99% 98% 97%  Weight: 105 kg   105 kg  Height:    5\' 10"  (1.778 m)    Intake/Output Summary (Last 24 hours) at 03/06/2018 1335 Last data filed at 03/06/2018 1117 Gross per 24 hour  Intake 765.1 ml  Output 1845 ml  Net -1079.9 ml   Filed Weights   03/04/18 1049 03/05/18 2050 03/06/18 1300  Weight: 105 kg 105 kg 105 kg    Exam:  . General: 61 y.o. year-old male WD WN NAD A&O x 3 . Cardiovascular: RRR no rubs or gallops. No JVD or thyromegaly noted..   . Respiratory: CTA no wheezes or rales. Good inspiratory effort . Abdomen: Soft nontender nondistended with normal bowel sounds x4 quadrants. . Musculoskeletal: Bilateral transtibial amputee. Marland Kitchen Psychiatry: Mood is appropriate for condition and setting   Data Reviewed: CBC: Recent Labs  Lab 02/28/18 0350 03/01/18 0340 03/02/18 0348 03/05/18 0402 03/06/18 0605  WBC 17.1* 12.1* 12.6* 13.4* 12.1*  NEUTROABS  --   --   --   --  10.0*  HGB 8.6* 9.6* 9.1* 9.8* 10.7*  HCT 27.6* 31.5* 29.6* 32.9* 36.1*  MCV 88.7 89.5 90.2 92.4 91.4  PLT 230 242 192 169 409   Basic Metabolic Panel: Recent Labs  Lab 03/01/18 0340 03/02/18 0342 03/03/18 0404 03/04/18 0321 03/05/18 0402 03/06/18 0435 03/06/18 0605  NA 135 138 139 140 141 139 139  K 4.0 3.0* 3.4* 3.9 3.7 3.6 3.7  CL 107 107 115* 117* 116* 114* 114*  CO2 18* 18* 19* 18* 20* 19* 21*  GLUCOSE 267* 127* 136* 114* 115* 138* 140*  BUN 80* 78* 61* 48* 35* 27* 26*  CREATININE 2.77* 2.55* 2.06* 1.70* 1.58* 1.43* 1.40*  CALCIUM 7.5* 7.6* 7.5* 7.6* 7.7* 7.8* 7.9*  MG  --  1.5*  --   --   --   --   --   PHOS 5.7* 4.6  --  3.0 2.5 2.6  --    GFR: Estimated Creatinine  Clearance: 67.2 mL/min (A) (by C-G formula based on SCr of 1.4 mg/dL (H)). Liver Function Tests: Recent Labs  Lab 03/02/18 0342 03/03/18 0404 03/04/18 0321 03/05/18 0402 03/06/18 0435  AST  --  56*  --   --   --   ALT  --  20  --   --   --   ALKPHOS  --  102  --   --   --   BILITOT  --  0.5  --   --   --   PROT  --  5.1*  --   --   --   ALBUMIN 1.8* 1.9* 1.9* 1.9* 1.9*   No results for input(s): LIPASE, AMYLASE in the last 168 hours. No results for input(s): AMMONIA in the last 168 hours. Coagulation Profile: Recent Labs  Lab 03/02/18 0348 03/03/18 0404 03/04/18 0321 03/05/18 0402 03/06/18 0435  INR 2.18 1.96 1.99 1.89 1.42   Cardiac Enzymes: No results for input(s): CKTOTAL, CKMB, CKMBINDEX, TROPONINI in the last 168 hours. BNP (last 3 results) No results for input(s): PROBNP in the last 8760 hours. HbA1C: No results for input(s): HGBA1C in the last 72 hours. CBG:  Recent Labs  Lab 03/05/18 1100 03/05/18 1549 03/05/18 2050 03/06/18 0744 03/06/18 1116  GLUCAP 137* 131* 173* 131* 111*   Lipid Profile: No results for input(s): CHOL, HDL, LDLCALC, TRIG, CHOLHDL, LDLDIRECT in the last 72 hours. Thyroid Function Tests: No results for input(s): TSH, T4TOTAL, FREET4, T3FREE, THYROIDAB in the last 72 hours. Anemia Panel: No results for input(s): VITAMINB12, FOLATE, FERRITIN, TIBC, IRON, RETICCTPCT in the last 72 hours. Urine analysis:    Component Value Date/Time   COLORURINE YELLOW 02/28/2018 2241   APPEARANCEUR HAZY (A) 02/28/2018 2241   LABSPEC 1.011 02/28/2018 2241   PHURINE 5.0 02/28/2018 2241   GLUCOSEU NEGATIVE 02/28/2018 2241   HGBUR LARGE (A) 02/28/2018 2241   BILIRUBINUR NEGATIVE 02/28/2018 2241   KETONESUR NEGATIVE 02/28/2018 2241   PROTEINUR 100 (A) 02/28/2018 2241   UROBILINOGEN 0.2 07/28/2014 0433   NITRITE NEGATIVE 02/28/2018 2241   LEUKOCYTESUR MODERATE (A) 02/28/2018 2241   Sepsis Labs: @LABRCNTIP (procalcitonin:4,lacticidven:4)  ) Recent  Results (from the past 240 hour(s))  Culture, Urine     Status: None   Collection Time: 02/28/18 10:34 AM  Result Value Ref Range Status   Specimen Description URINE, RANDOM  Final   Special Requests NONE  Final   Culture   Final    NO GROWTH Performed at [facility], [place] [address], [place], [place] [address]    Report Status 03/01/2018 FINAL  Final  Culture, Urine     Status: None   Collection Time: 02/28/18 10:34 PM  Result Value Ref Range Status   Specimen Description URINE, RANDOM  Final   Special Requests NONE  Final   Culture   Final    NO GROWTH Performed at [facility], [place] [address], [place], [place] [address]    Report Status 03/02/2018 FINAL  Final      Studies: No results found.  Scheduled Meds: . [MAR Hold] Chlorhexidine Gluconate Cloth  6 each Topical Q0600  . [MAR Hold] doxycycline  100 mg Oral Q12H  . [MAR Hold] insulin aspart  0-5 Units Subcutaneous QHS  . [MAR Hold] insulin aspart  0-9 Units Subcutaneous TID WC  . [MAR Hold] insulin aspart  4 Units Subcutaneous TID WC  . [MAR Hold] insulin glargine  22 Units Subcutaneous QHS  . [MAR Hold] latanoprost  1 drop Both Eyes QHS  . [MAR Hold] mouth rinse  15 mL Mouth Rinse BID  . [MAR Hold] metoprolol tartrate  50 mg Oral BID  . [MAR Hold] multivitamin  1 tablet Oral QHS  . [MAR Hold] nystatin   Topical BID  . [MAR Hold] pantoprazole  40 mg Oral Q1200  . [MAR Hold] saccharomyces boulardii  250 mg Oral BID  . [MAR Hold] traZODone  50 mg Oral QHS    Continuous Infusions: . [MAR Hold] sodium chloride 0 mL/hr at 02/28/18 1202  . sodium chloride    . [MAR Hold] piperacillin-tazobactam (ZOSYN)  IV 3.375 g (03/06/18 0901)     LOS: 70 days     Kayleen Memos, MD [facility] Pager [phone]  If 7PM-7AM, please contact night-coverage [email or web address] Password [email or web address] 03/06/2018, 1:35 PM

## 2018-03-06 NOTE — Transfer of Care (Signed)
Immediate Anesthesia Transfer of Care Note  Patient: [name]  Procedure(s) Performed: ENDOSCOPIC RETROGRADE CHOLANGIOPANCREATOGRAPHY (ERCP) (N/A ) SPHINCTEROTOMY REMOVAL OF STONES BILIARY STENT PLACEMENT  Patient Location: PACU  Anesthesia Type:General  Level of Consciousness: awake and drowsy  Airway & Oxygen Therapy: Patient Spontanous Breathing and Patient connected to nasal cannula oxygen  Post-op Assessment: Report given to RN and Post -op Vital signs reviewed and stable  Post vital signs: Reviewed and stable  Last Vitals:  Vitals Value Taken Time  BP    Temp    Pulse    Resp    SpO2      Last Pain:  Vitals:   03/06/18 1300  TempSrc: Oral  PainSc: 0-No pain      Patients Stated Pain Goal: 0 (95/18/84 1660)  Complications: No apparent anesthesia complications

## 2018-03-07 ENCOUNTER — Encounter (HOSPITAL_COMMUNITY): Payer: Self-pay | Admitting: Internal Medicine

## 2018-03-07 LAB — RENAL FUNCTION PANEL
ANION GAP: 6 (ref 5–15)
Albumin: 2 g/dL — ABNORMAL LOW (ref 3.5–5.0)
BUN: 29 mg/dL — ABNORMAL HIGH (ref 8–23)
CALCIUM: 7.8 mg/dL — AB (ref 8.9–10.3)
CO2: 18 mmol/L — AB (ref 22–32)
Chloride: 116 mmol/L — ABNORMAL HIGH (ref 98–111)
Creatinine, Ser: 1.49 mg/dL — ABNORMAL HIGH (ref 0.61–1.24)
GFR calc non Af Amer: 49 mL/min — ABNORMAL LOW (ref >60)
GFR, EST AFRICAN AMERICAN: 57 mL/min — AB (ref >60)
Glucose, Bld: 163 mg/dL — ABNORMAL HIGH (ref 70–99)
Phosphorus: 4.3 mg/dL (ref 2.5–4.6)
Potassium: 4.2 mmol/L (ref 3.5–5.1)
SODIUM: 140 mmol/L (ref 135–145)

## 2018-03-07 LAB — GLUCOSE, CAPILLARY
GLUCOSE-CAPILLARY: 187 mg/dL — AB (ref 70–99)
GLUCOSE-CAPILLARY: 193 mg/dL — AB (ref 70–99)
Glucose-Capillary: 123 mg/dL — ABNORMAL HIGH (ref 70–99)
Glucose-Capillary: 104 mg/dL — ABNORMAL HIGH (ref 70–99)

## 2018-03-07 LAB — PROTIME-INR
INR: 1.34
Prothrombin Time: 16.5 seconds — ABNORMAL HIGH (ref 11.4–15.2)

## 2018-03-07 MED ORDER — SODIUM CHLORIDE 0.9 % IV SOLN
850.0000 mg | Freq: Every day | INTRAVENOUS | Status: DC
  Administered 2018-03-07 – 2018-03-11 (×5): 850 mg via INTRAVENOUS
  Filled 2018-03-07 (×5): qty 17

## 2018-03-07 MED ORDER — SODIUM CHLORIDE 0.9 % IV SOLN
INTRAVENOUS | Status: DC
  Administered 2018-03-07 – 2018-03-09 (×3): via INTRAVENOUS

## 2018-03-07 MED ORDER — WARFARIN SODIUM 2.5 MG PO TABS
2.5000 mg | ORAL_TABLET | Freq: Once | ORAL | Status: AC
  Administered 2018-03-07: 2.5 mg via ORAL
  Filled 2018-03-07: qty 1

## 2018-03-07 NOTE — Progress Notes (Signed)
Pharmacy Antibiotic Note  [name] is a 62 y.o. male continues on Zosyn since 10/25 for gangrenous cholecystitis.  Previously on Daptomycin 9/25>>10/25 for MSSA bacteremia and endocarditis, which is on hold while on Zosyn.  Prior plan to resume Daptomycin on 10/30, and to continue Zosyn until abdominal issues have resolved.  S/p ERCP and stent today. Surgery okay with change back to Daptomycin today.   Coumadin for atrial fibrillation resumed on 10/27 with small dose of 0.5 mg then held for ERCP.  Vitamin K 5 mg PO given on 10/30 and INR down to 1.42 on 10/31 and ok to resume Coumadin post-ERCP/stent per GI.  Previously therapeutic on Coumadin but some sensitivity, with INR trending >3 on 2.5 mg daily.    INR down to 1.34 after Coumadin 2 mg x 1 last night.   INR goal 2-3.   Plan:  Zosyn to stop after next dose (4pm)  Daptomycin 8 mg/kg (850 mg) IV q24 hrs to resume at 8pm.  Last CK 169 on 10/24. No further checks planned since will stop in <7 days.  Per prior plan, IV antibiotics thru 11/6 and then begin oral Doxy x 4 weeks on 11/7.  Coumadin 2.5 mg x 1 tonight.  Continue daily PT/INR.  Height: 5\' 10"  (177.8 cm) Weight: 231 lb 7.7 oz (105 kg) IBW/kg (Calculated) : 73  Temp (24hrs), Avg:97.8 F (36.6 C), Min:97.7 F (36.5 C), Max:97.9 F (36.6 C)  Recent Labs  Lab 03/01/18 0340  03/02/18 0348  03/04/18 0321 03/05/18 0402 03/06/18 0435 03/06/18 0605 03/07/18 0342  WBC 12.1*  --  12.6*  --   --  13.4*  --  12.1*  --   CREATININE 2.77*   < >  --    < > 1.70* 1.58* 1.43* 1.40* 1.49*   < > = values in this interval not displayed.    Estimated Creatinine Clearance: 63.2 mL/min (A) (by C-G formula based on SCr of 1.49 mg/dL (H)).    Allergies  Allergen Reactions  . Ace Inhibitors Cough  . Cefazolin     Possible cause of AKI, AIN   Antibiotics this admission:  Daptomycin 9/25>>10/25; resume 11/1>>(11/6)  Zosyn 10/25>> 11/1  Doxycycline oral 11/7>>(12/4)   Arty Baumgartner, RPh Pager: [phone] or phone: [phone] 03/07/2018 3:39 PM

## 2018-03-07 NOTE — Progress Notes (Signed)
PROGRESS NOTE  [name] TML:[id number] DOB: 1955/11/01 DOA: 01/27/2018 PCP: [name], MD  HPI/Recap of past 24 hours:  62 y.o.malewith a history of sepsis due to right foot osteomyelitis s/p transtibial amputation and found to have MSSA bacteremia with splenic infarct and tricuspid valve endocarditis. He was discharged on ancef to [facility] but was quickly brought back in due to oliguria and found to have acute renal failure. He developed acute respiratory failure and transferred to ICU on CCM service. Antibiotics changed to daptomycin per ID and nephrology oversaw hemodialysis through right IJ temporary catheter on 10/12 and 10/14. With improvement in respiratory status he was transferred to the medical floor. Nephrology following closely for signs of renal recovery. Creatinine trending downward as of 10/17 with increasing urine output.Temporary HD catheter removed 10/21. On 10/22 the patient developed acute abdominal pain worse on the right with nausea, vomiting, and inability to take po. CT abd/pelvis demonstrated distended gallbladder with gallstones concerning for cholecystitis. Also showed anasarca, ascites, and pleural effusions, progressive T12-L1 changes noticed on previous study, and stable splenic lesions.  03/05/2018: Patient seen and examined at his bedside.  No acute events overnight.  Patient has no new complaints.  He denies any right upper quadrant pain or nausea.  General surgery following.  Possible ERCP by GI tomorrow.  N.p.o. after midnight.  03/06/18: seen and examined with brother at bedside. No significant pain from ruq abd. No nausea. Still significant output from surgical drain. ERCP planned this afternoon.  03/07/2018: Patient seen and examined at his bedside.  He is ERCP POD #1.  Reports mild pain at his right lower quadrant at the site of surgical drain.  Denies nausea.  Also denies any cardiopulmonary symptoms.  Assessment/Plan: Principal  Problem:   Acute renal failure (ARF) (HCC) Active Problems:   Insulin-requiring or dependent type II diabetes mellitus ([place])   Essential hypertension   CAD (coronary artery disease)   Leukocytosis   Thrombocytopenia (HCC)   S/P BKA (below knee amputation) unilateral, right (HCC)   Endocarditis of tricuspid valve   Hyponatremia   PAF (paroxysmal atrial fibrillation) (HCC)   Aortic atherosclerosis (HCC)   Acute hypoxemic respiratory failure (HCC)   AKI (acute kidney injury) ([place])   Acute gangrenous cholecystitis s/p lap cholecystectomy 02/28/2018   Bile leak, postoperative  Recurrent AKI on CKD 3 Creatinine worsened from 1.40-1.49 Started gentle IV fluid hydration normal saline at 75 cc/h GFR 49 today with baseline GFR of 53 Monitor urine output Avoid nephrotoxic agents/dehydration/hypotension Repeat BMP in the morning  Acute gangrenous cholecystitis POD #7 status post laparoscopic cholecystectomy and placement of 19 F. Blake drain with concern for a leak ERCP completed on 03/06/2018 by GI  Biliary leak post ERCP with biliary stent placement Appears to be stable at this time We will follow-up with GI outpatient in 6 to 8 weeks. Resume Coumadin postprocedure Continue to monitor output from surgical drain  MSSA bacteremia and tricuspid valve endocarditis complicated by splenic infarcts with sepsis ID recommendations to complete daptomycin for 6 weeks treatment through 03/08/2018  Obtain CPK weekly  Following completion of daptomycin, ID recommends starting doxycycline 100 mg p.o. twice daily on a full stomach Continue to monitor fever curve and WBC Obtain CBC in the morning  P.Afib on coumadin Resume Coumadin INR is currently sub-therapeutic 1.39 Pharmacy managing Coumadin  Subtherapeutic INR Goal between 2 and 3 Continue Coumadin Pharmacy managing.  Highly appreciated. Obtain INR in the morning  T12-L1 discitis/osteomyelitis Continue doxycycline after completion  of  daptomycin  Severe PVD status post bilateral transtibial amputations Plan for prosthetic fitting outpatient with Dr. Sharol Given Fall precaution Continue to work with physical therapy  Type 2 diabetes complicated by hyperglycemia Last A1c 8.5  continue insulin sliding scale Avoid hypoglycemia   Code Status: Full code  Family Communication: None at bedside  Disposition Plan: Home when clinically stable   Consultants:  General surgery  GI  Infectious disease  Pharmacy  Procedures:  Temporary HD catheter  Cholecystectomy 02/28/2018  ERCP on 03/06/2018  Antimicrobials:  Daptomycin  DVT prophylaxis: Coumadin   Objective: Vitals:   03/06/18 1540 03/06/18 1604 03/06/18 2127 03/07/18 0909  BP: 122/68 139/73 124/75 136/66  Pulse: 83 82 77 90  Resp: (!) 21 20 18 19   Temp:  97.7 F (36.5 C)  97.9 F (36.6 C)  TempSrc:  Oral  Oral  SpO2: 100% 98% 98% 100%  Weight:      Height:        Intake/Output Summary (Last 24 hours) at 03/07/2018 1400 Last data filed at 03/07/2018 0900 Gross per 24 hour  Intake 1027.45 ml  Output 650 ml  Net 377.45 ml   Filed Weights   03/04/18 1049 03/05/18 2050 03/06/18 1300  Weight: 105 kg 105 kg 105 kg    Exam:  . General: 62 y.o. year-old male pleasant well-developed well-nourished in no acute distress.  Alert and oriented x3. . Cardiovascular: Regular rate and rhythm with no rubs or gallops.  No JVD or thyromegaly noted. Marland Kitchen Respiratory: Clear to auscultation with no wheezes or rales.  Good inspiratory effort.   . Abdomen: Soft nontender nondistended with normal bowel sounds x4 quadrants.  Surgical drain located at right lower quadrant with no erythema no discharge noted.  Minimal output noted from surgical drainage. . Musculoskeletal: Bilateral transtibial amputee. Marland Kitchen Psychiatry: Mood is appropriate for condition and setting   Data Reviewed: CBC: Recent Labs  Lab 03/01/18 0340 03/02/18 0348 03/05/18 0402 03/06/18 0605    WBC 12.1* 12.6* 13.4* 12.1*  NEUTROABS  --   --   --  10.0*  HGB 9.6* 9.1* 9.8* 10.7*  HCT 31.5* 29.6* 32.9* 36.1*  MCV 89.5 90.2 92.4 91.4  PLT 242 192 169 213   Basic Metabolic Panel: Recent Labs  Lab 03/02/18 0342  03/04/18 0321 03/05/18 0402 03/06/18 0435 03/06/18 0605 03/07/18 0342  NA 138   < > 140 141 139 139 140  K 3.0*   < > 3.9 3.7 3.6 3.7 4.2  CL 107   < > 117* 116* 114* 114* 116*  CO2 18*   < > 18* 20* 19* 21* 18*  GLUCOSE 127*   < > 114* 115* 138* 140* 163*  BUN 78*   < > 48* 35* 27* 26* 29*  CREATININE 2.55*   < > 1.70* 1.58* 1.43* 1.40* 1.49*  CALCIUM 7.6*   < > 7.6* 7.7* 7.8* 7.9* 7.8*  MG 1.5*  --   --   --   --   --   --   PHOS 4.6  --  3.0 2.5 2.6  --  4.3   < > = values in this interval not displayed.   GFR: Estimated Creatinine Clearance: 63.2 mL/min (A) (by C-G formula based on SCr of 1.49 mg/dL (H)). Liver Function Tests: Recent Labs  Lab 03/03/18 0404 03/04/18 0321 03/05/18 0402 03/06/18 0435 03/07/18 0342  AST 56*  --   --   --   --   ALT 20  --   --   --   --  ALKPHOS 102  --   --   --   --   BILITOT 0.5  --   --   --   --   PROT 5.1*  --   --   --   --   ALBUMIN 1.9* 1.9* 1.9* 1.9* 2.0*   No results for input(s): LIPASE, AMYLASE in the last 168 hours. No results for input(s): AMMONIA in the last 168 hours. Coagulation Profile: Recent Labs  Lab 03/03/18 0404 03/04/18 0321 03/05/18 0402 03/06/18 0435 03/07/18 0342  INR 1.96 1.99 1.89 1.42 1.34   Cardiac Enzymes: No results for input(s): CKTOTAL, CKMB, CKMBINDEX, TROPONINI in the last 168 hours. BNP (last 3 results) No results for input(s): PROBNP in the last 8760 hours. HbA1C: No results for input(s): HGBA1C in the last 72 hours. CBG: Recent Labs  Lab 03/06/18 1116 03/06/18 1606 03/06/18 2126 03/07/18 0753 03/07/18 1126  GLUCAP 111* 153* 144* 123* 187*   Lipid Profile: No results for input(s): CHOL, HDL, LDLCALC, TRIG, CHOLHDL, LDLDIRECT in the last 72  hours. Thyroid Function Tests: No results for input(s): TSH, T4TOTAL, FREET4, T3FREE, THYROIDAB in the last 72 hours. Anemia Panel: No results for input(s): VITAMINB12, FOLATE, FERRITIN, TIBC, IRON, RETICCTPCT in the last 72 hours. Urine analysis:    Component Value Date/Time   COLORURINE YELLOW 02/28/2018 2241   APPEARANCEUR HAZY (A) 02/28/2018 2241   LABSPEC 1.011 02/28/2018 2241   PHURINE 5.0 02/28/2018 2241   GLUCOSEU NEGATIVE 02/28/2018 2241   HGBUR LARGE (A) 02/28/2018 2241   BILIRUBINUR NEGATIVE 02/28/2018 2241   KETONESUR NEGATIVE 02/28/2018 2241   PROTEINUR 100 (A) 02/28/2018 2241   UROBILINOGEN 0.2 07/28/2014 0433   NITRITE NEGATIVE 02/28/2018 2241   LEUKOCYTESUR MODERATE (A) 02/28/2018 2241   Sepsis Labs: @LABRCNTIP (procalcitonin:4,lacticidven:4)  ) Recent Results (from the past 240 hour(s))  Culture, Urine     Status: None   Collection Time: 02/28/18 10:34 AM  Result Value Ref Range Status   Specimen Description URINE, RANDOM  Final   Special Requests NONE  Final   Culture   Final    NO GROWTH Performed at [facility], [place] [address] [place], [place] [address]    Report Status 03/01/2018 FINAL  Final  Culture, Urine     Status: None   Collection Time: 02/28/18 10:34 PM  Result Value Ref Range Status   Specimen Description URINE, RANDOM  Final   Special Requests NONE  Final   Culture   Final    NO GROWTH Performed at [facility], [place] [address], [place], [place] [address]    Report Status 03/02/2018 FINAL  Final      Studies: Dg Ercp Biliary & Pancreatic Ducts  Result Date: 03/06/2018 CLINICAL DATA:  63 year old male with a history of postoperative bile leak. EXAM: ERCP TECHNIQUE: Multiple spot images obtained with the fluoroscopic device and submitted for interpretation post-procedure. FLUOROSCOPY TIME:  Fluoroscopy Time:  8 minutes 3 seconds reported COMPARISON:  None. FINDINGS: A total of 9 intraoperative saved images are submitted for  review. The images demonstrate a flexible endoscope in the descending duodenum. Initially, the main pancreatic duct is catheterized and a pancreatic ductogram performed. Pancreatic duct appears normal. Subsequently, the common bile duct is catheterized and a cholangiogram was performed. A balloon occluded cholangiogram demonstrates extravasation of contrast from the stump of the cystic duct. No evidence of biliary obstruction, stenosis or stricture. On the final image, a plastic biliary stent has been placed to facilitate decompression. IMPRESSION:  1. Positive for bile leak emanating from the cystic duct stump. 2. Successful placement of a plastic biliary stent. These images were submitted for radiologic interpretation only. Please see the procedural report for the amount of contrast and the fluoroscopy time utilized. Electronically Signed   By: Jacqulynn Cadet M.D.   On: 03/06/2018 16:53    Scheduled Meds: . Chlorhexidine Gluconate Cloth  6 each Topical Q0600  . [START ON 03/13/2018] doxycycline  100 mg Oral Q12H  . insulin aspart  0-5 Units Subcutaneous QHS  . insulin aspart  0-9 Units Subcutaneous TID WC  . insulin aspart  4 Units Subcutaneous TID WC  . insulin glargine  22 Units Subcutaneous QHS  . latanoprost  1 drop Both Eyes QHS  . mouth rinse  15 mL Mouth Rinse BID  . metoprolol tartrate  50 mg Oral BID  . multivitamin  1 tablet Oral QHS  . nystatin   Topical BID  . pantoprazole  40 mg Oral Q1200  . saccharomyces boulardii  250 mg Oral BID  . traZODone  50 mg Oral QHS  . warfarin  2.5 mg Oral ONCE-1800  . Warfarin - Pharmacist Dosing Inpatient   Does not apply q1800    Continuous Infusions: . sodium chloride 0 mL/hr at 02/28/18 1202  . sodium chloride    . sodium chloride 75 mL/hr at 03/07/18 0608  . piperacillin-tazobactam (ZOSYN)  IV 3.375 g (03/07/18 0851)     LOS: 39 days     Kayleen Memos, MD [facility] Pager [phone]  If 7PM-7AM, please contact  night-coverage [email or web address] Password [email or web address] 03/07/2018, 2:00 PM

## 2018-03-07 NOTE — Progress Notes (Signed)
Patient ID: [name], male   DOB: Nov 20, 1955, 62 y.o.   MRN: [id number]    1 Day Post-Op  Subjective: Patient feels ok today.  No new complaints.  Objective: Vital signs in last 24 hours: Temp:  [97.7 F (36.5 C)-99.1 F (37.3 C)] 97.9 F (36.6 C) (11/01 0909) Pulse Rate:  [77-90] 90 (11/01 0909) Resp:  [13-21] 19 (11/01 0909) BP: (122-155)/(66-86) 136/66 (11/01 0909) SpO2:  [97 %-100 %] 100 % (11/01 0909) Weight:  [105 kg] 105 kg (10/31 1300) Last BM Date: 03/05/18  Intake/Output from previous day: 10/31 0701 - 11/01 0700 In: 1157.5 [P.O.:360; I.V.:447.5; IV Piggyback:100] Out: 1025 [Urine:800; Drains:225] Intake/Output this shift: No intake/output data recorded.  PE: Abd: soft, incisions healing well and are c/d/i, drain with serous appearing output, maybe slightly bile tinged (bright yellow), 225cc of output yesterday   Lab Results:  Recent Labs    03/05/18 0402 03/06/18 0605  WBC 13.4* 12.1*  HGB 9.8* 10.7*  HCT 32.9* 36.1*  PLT 169 188   BMET Recent Labs    03/06/18 0605 03/07/18 0342  NA 139 140  K 3.7 4.2  CL 114* 116*  CO2 21* 18*  GLUCOSE 140* 163*  BUN 26* 29*  CREATININE 1.40* 1.49*  CALCIUM 7.9* 7.8*   PT/INR Recent Labs    03/06/18 0435 03/07/18 0342  LABPROT 17.2* 16.5*  INR 1.42 1.34   CMP     Component Value Date/Time   NA 140 03/07/2018 0342   K 4.2 03/07/2018 0342   CL 116 (H) 03/07/2018 0342   CO2 18 (L) 03/07/2018 0342   GLUCOSE 163 (H) 03/07/2018 0342   BUN 29 (H) 03/07/2018 0342   CREATININE 1.49 (H) 03/07/2018 0342   CALCIUM 7.8 (L) 03/07/2018 0342   PROT 5.1 (L) 03/03/2018 0404   ALBUMIN 2.0 (L) 03/07/2018 0342   AST 56 (H) 03/03/2018 0404   ALT 20 03/03/2018 0404   ALKPHOS 102 03/03/2018 0404   BILITOT 0.5 03/03/2018 0404   GFRNONAA 49 (L) 03/07/2018 0342   GFRAA 57 (L) 03/07/2018 0342   Lipase     Component Value Date/Time   LIPASE 23 01/14/2018 1926       Studies/Results: Dg Ercp Biliary &  Pancreatic Ducts  Result Date: 03/06/2018 CLINICAL DATA:  62 year old male with a history of postoperative bile leak. EXAM: ERCP TECHNIQUE: Multiple spot images obtained with the fluoroscopic device and submitted for interpretation post-procedure. FLUOROSCOPY TIME:  Fluoroscopy Time:  8 minutes 3 seconds reported COMPARISON:  None. FINDINGS: A total of 9 intraoperative saved images are submitted for review. The images demonstrate a flexible endoscope in the descending duodenum. Initially, the main pancreatic duct is catheterized and a pancreatic ductogram performed. Pancreatic duct appears normal. Subsequently, the common bile duct is catheterized and a cholangiogram was performed. A balloon occluded cholangiogram demonstrates extravasation of contrast from the stump of the cystic duct. No evidence of biliary obstruction, stenosis or stricture. On the final image, a plastic biliary stent has been placed to facilitate decompression. IMPRESSION: 1. Positive for bile leak emanating from the cystic duct stump. 2. Successful placement of a plastic biliary stent. These images were submitted for radiologic interpretation only. Please see the procedural report for the amount of contrast and the fluoroscopy time utilized. Electronically Signed   By: Jacqulynn Cadet M.D.   On: 03/06/2018 16:53    Anti-infectives: Anti-infectives (From admission, onward)   Start     Dose/Rate Route Frequency Ordered Stop   03/13/18  1000  doxycycline (VIBRA-TABS) tablet 100 mg     100 mg Oral Every 12 hours 03/01/18 1217 04/10/18 0959   03/06/18 1800  DAPTOmycin (CUBICIN) 385 mg in sodium chloride 0.9 % IVPB  Status:  Discontinued     8 mg/kg  48.1 kg 215.4 mL/hr over 30 Minutes Intravenous Every 48 hours 03/01/18 1213 03/02/18 1158   03/05/18 2000  DAPTOmycin (CUBICIN) 385 mg in sodium chloride 0.9 % IVPB  Status:  Discontinued     8 mg/kg  48.1 kg 215.4 mL/hr over 30 Minutes Intravenous Every 48 hours 03/02/18 1158  03/03/18 1322   03/05/18 2000  DAPTOmycin (CUBICIN) 850 mg in sodium chloride 0.9 % IVPB  Status:  Discontinued     850 mg 234 mL/hr over 30 Minutes Intravenous Daily 03/03/18 1322 03/04/18 1401   03/05/18 1800  DAPTOmycin (CUBICIN) 385 mg in sodium chloride 0.9 % IVPB  Status:  Discontinued     8 mg/kg  48.1 kg 215.4 mL/hr over 30 Minutes Intravenous Every 48 hours 03/01/18 1200 03/01/18 1213   03/04/18 1600  piperacillin-tazobactam (ZOSYN) IVPB 3.375 g     3.375 g 12.5 mL/hr over 240 Minutes Intravenous Every 8 hours 03/04/18 1401     03/03/18 1600  piperacillin-tazobactam (ZOSYN) IVPB 3.375 g  Status:  Discontinued     3.375 g 12.5 mL/hr over 240 Minutes Intravenous Every 8 hours 03/03/18 1230 03/04/18 1401   02/28/18 1530  piperacillin-tazobactam (ZOSYN) IVPB 3.375 g  Status:  Discontinued     3.375 g 12.5 mL/hr over 240 Minutes Intravenous Every 12 hours 02/28/18 1518 03/03/18 1230   02/26/18 2000  cefTRIAXone (ROCEPHIN) 2 g in sodium chloride 0.9 % 100 mL IVPB  Status:  Discontinued     2 g 200 mL/hr over 30 Minutes Intravenous Every 24 hours 02/26/18 1911 02/28/18 1500   02/26/18 2000  metroNIDAZOLE (FLAGYL) IVPB 500 mg  Status:  Discontinued     500 mg 100 mL/hr over 60 Minutes Intravenous Every 8 hours 02/26/18 1911 02/28/18 1500   02/24/18 0000  daptomycin (CUBICIN) IVPB  Status:  Discontinued     700 mg Intravenous Every 24 hours 02/24/18 1141 03/03/18    02/23/18 2000  DAPTOmycin (CUBICIN) 700 mg in sodium chloride 0.9 % IVPB  Status:  Discontinued     700 mg 228 mL/hr over 30 Minutes Intravenous Every 24 hours 02/23/18 1233 02/26/18 1911   02/13/18 1415  ceFAZolin (ANCEF) powder 2 g     2 g Other To Surgery 02/13/18 1410 02/13/18 2142   02/13/18 1347  ceFAZolin (ANCEF) 2-4 GM/100ML-% IVPB    Note to Pharmacy:  Arlean Hopping   : cabinet override      02/13/18 1347 02/13/18 1410   01/30/18 2000  DAPTOmycin (CUBICIN) 700 mg in sodium chloride 0.9 % IVPB  Status:   Discontinued     700 mg 228 mL/hr over 30 Minutes Intravenous Every 48 hours 01/30/18 1306 02/23/18 1233   01/30/18 1000  fluconazole (DIFLUCAN) tablet 50 mg     50 mg Oral Daily 01/29/18 0930 02/04/18 0914   01/29/18 2000  DAPTOmycin (CUBICIN) 700 mg in sodium chloride 0.9 % IVPB  Status:  Discontinued     700 mg 228 mL/hr over 30 Minutes Intravenous Every 48 hours 01/29/18 1013 01/30/18 1306   01/29/18 1030  fluconazole (DIFLUCAN) tablet 100 mg     100 mg Oral  Once 01/29/18 0930 01/29/18 1121   01/28/18 2200  ceFAZolin (ANCEF) IVPB 1  g/50 mL premix  Status:  Discontinued     1 g 100 mL/hr over 30 Minutes Intravenous Every 24 hours 01/28/18 1007 01/28/18 1231   01/27/18 2245  ceFAZolin (ANCEF) IVPB 2g/100 mL premix  Status:  Discontinued     2 g 200 mL/hr over 30 Minutes Intravenous Every 12 hours 01/27/18 2243 01/28/18 1007   01/27/18 2230  ceFAZolin (ANCEF) IVPB  Status:  Discontinued    Note to Pharmacy:  Indication:  MSSA bacteremia/endocarditis Last Day of Therapy:  03/08/18 Labs - Once weekly:  CBC/D and BMP, Labs - Every other week:  ESR and CRP     2 g Intravenous Every 8 hours 01/27/18 2220 01/27/18 2227   01/27/18 2230  ceFAZolin (ANCEF) IVPB 2g/100 mL premix  Status:  Discontinued     2 g 200 mL/hr over 30 Minutes Intravenous Every 8 hours 01/27/18 2227 01/27/18 2243       Assessment/Plan MSSA bacteremia Osteomyelitis of RLE, s/p BKA H/o L BKA Endocarditis ARF, just off HD ParoxysmalA fib- oncoumadin, INR2.5 DM-II PVD H/o MI  Acute, gangrenouscholecystitis POD#7-s/p laparoscopic cholecystectomy, placement19F blakedrain, with bile leakNinfa Linden -s/p ERCP which confirmed bile leak at cystic duct remnant.  Stent placed -advanced diet back to heart healthy -patient is surgically stable for DC to SNF when medically stable -follow up is being arranged with Dr.Blackman for routine follow up as well as drain evaluation. -patient will be prn over the  weekend -no further abx therapy is needed in regards to his gallbladder at this time.  abx per ID recommendations for his bacteremia  ID -daptomycin 9/25-10/25, Zosyn 10/25 >>Doxycycline --> FEN - heart healthy diet VTE -couamdin Foley -none Follow up: Dr. Ninfa Linden   LOS: 39 days    Henreitta Cea , [facility] 03/07/2018, 9:15 AM Pager: [phone]

## 2018-03-07 NOTE — Progress Notes (Signed)
Physical Therapy Treatment Patient Details Name: [name] MRN: [id number] DOB: 10/14/1955 Today's Date: 03/07/2018    History of Present Illness Pt is a 62 y/o male with a complicated PMH hospitalized September for sepsis secondary to right foot osteomyelitis, underwent BKA, found to have MSSA bacteremia with splenic infarct and tricuspid valve endocarditis. He was discharged to SNF and developed acute oliguria - presented to the ED where he was found to have AKI.  Developed acute respiratory failure necessitating transfer to ICU.  Temporary dialysis catheter was placed for HD. s/p lap cholecystectomy 10/25.    PT Comments    Patient progressing with seated activities and able to don prosthetic on L for increasing wear time and reshaping limb for weight bearing.  Feel he can benefit from continued acute skilled PT to progress to goals as well as follow up SNF level rehab prior to d/c home.   Follow Up Recommendations  SNF;Supervision/Assistance - 24 hour     Equipment Recommendations  None recommended by PT    Recommendations for Other Services       Precautions / Restrictions Precautions Precautions: Fall Precaution Comments: JP drain Required Braces or Orthoses: Other Brace/Splint Other Brace/Splint: limb protector R LE, shrinkers bilat, prosthesis L LE Restrictions RLE Weight Bearing: Non weight bearing LLE Weight Bearing: Weight bearing as tolerated(in prosthetic)    Mobility  Bed Mobility Overal bed mobility: Needs Assistance Bed Mobility: Rolling;Supine to Sit Rolling: Mod assist   Supine to sit: Mod assist;+2 for safety/equipment;HOB elevated     General bed mobility comments: rolling for hygiene due to liquid stool incontinence; lifted shoulders up to long sitting and assist to scoot around with pads under pt  Transfers Overall transfer level: Needs assistance Equipment used: None Transfers: Anterior-Posterior Transfer Sit to Stand: Mod assist;+2  physical assistance     Anterior-Posterior transfers: Mod assist;+2 physical assistance   General transfer comment: A-P transfer with cues for UE use, hand placement and incremental scoots with cues  Ambulation/Gait                 Stairs             Wheelchair Mobility    Modified Rankin (Stroke Patients Only)       Balance Overall balance assessment: Needs assistance Sitting-balance support: Bilateral upper extremity supported Sitting balance-Leahy Scale: Poor Sitting balance - Comments: reliance on UE support behind him with occasional min A on air mattress                                    Cognition Arousal/Alertness: Awake/alert Behavior During Therapy: WFL for tasks assessed/performed Overall Cognitive Status: Within Functional Limits for tasks assessed                                        Exercises Amputee Exercises Towel Squeeze: Strengthening;10 reps;Seated(5 sec hold) Knee Extension: Strengthening;10 reps;Seated(5 sec hold) Chair Push Up: Strengthening;10 reps;Seated    General Comments General comments (skin integrity, edema, etc.): donned L prosthetic while up in chair for wearing tolerance; attempted with 5 ply & 3 ply like he wears at home and could not get foam insert on so with just 5 ply able to don liner somewhat, but not leg. so replaced 5 ply with 3 ply and able to don leg.  Reports       Pertinent Vitals/Pain Pain Assessment: Faces Faces Pain Scale: Hurts a little bit Pain Location: buttocks with hygiene Pain Descriptors / Indicators: Discomfort;Sore Pain Intervention(s): Monitored during session;Repositioned    Home Living                      Prior Function            PT Goals (current goals can now be found in the care plan section) Progress towards PT goals: Progressing toward goals    Frequency    Min 2X/week      PT Plan Current plan remains appropriate     Co-evaluation              AM-PAC PT "6 Clicks" Daily Activity  Outcome Measure  Difficulty turning over in bed (including adjusting bedclothes, sheets and blankets)?: Unable Difficulty moving from lying on back to sitting on the side of the bed? : Unable Difficulty sitting down on and standing up from a chair with arms (e.g., wheelchair, bedside commode, etc,.)?: Unable Help needed moving to and from a bed to chair (including a wheelchair)?: A Lot Help needed walking in hospital room?: Total Help needed climbing 3-5 steps with a railing? : Total 6 Click Score: 7    End of Session   Activity Tolerance: Patient tolerated treatment well Patient left: in chair;with call bell/phone within reach;with chair alarm set Nurse Communication: Mobility status PT Visit Diagnosis: Other abnormalities of gait and mobility (R26.89);Pain Pain - Right/Left: Right Pain - part of body: Leg     Time: 4010-2725 PT Time Calculation (min) (ACUTE ONLY): 31 min  Charges:  $Therapeutic Exercise: 8-22 mins $Therapeutic Activity: 8-22 mins                     Magda Kiel, [place] [facility] [phone] 03/07/2018    Reginia Naas 03/07/2018, 11:21 AM

## 2018-03-07 NOTE — Progress Notes (Addendum)
Daily Rounding Note  03/07/2018, 9:56 AM  LOS: 39 days   SUBJECTIVE:   Chief complaint: post-op bile leak.     JP drainage: 320, 120, 225 mls over previous 3 days.  Twinges of pain/discomfort at RUQ in region of JP drain.   Appetite improving.    Overall feeling better and stronger.    OBJECTIVE:         Vital signs in last 24 hours:    Temp:  [97.7 F (36.5 C)-99.1 F (37.3 C)] 97.9 F (36.6 C) (11/01 0909) Pulse Rate:  [77-90] 90 (11/01 0909) Resp:  [13-21] 19 (11/01 0909) BP: (122-155)/(66-86) 136/66 (11/01 0909) SpO2:  [97 %-100 %] 100 % (11/01 0909) Weight:  [105 kg] 105 kg (10/31 1300) Last BM Date: 03/05/18 Filed Weights   03/04/18 1049 03/05/18 2050 03/06/18 1300  Weight: 105 kg 105 kg 105 kg   General: looks chronically ill.  comfortable   Heart: RRR Chest: clear bil.  No labored breathing or cough Abdomen: soft, ND.  ~ 75 ml, deep yellow/orange clear fluid in JP  Extremities: bil LE amputation sites without redness or sotres Neuro/Psych:  Oriented x 3.  Alert.  Good historian.  Fluid speech.    Intake/Output from previous day: 10/31 0701 - 11/01 0700 In: 1157.5 [P.O.:360; I.V.:447.5; IV Piggyback:100] Out: 1025 [Urine:800; Drains:225]  Intake/Output this shift: Total I/O In: 120 [P.O.:120] Out: -   Lab Results: Recent Labs    03/05/18 0402 03/06/18 0605  WBC 13.4* 12.1*  HGB 9.8* 10.7*  HCT 32.9* 36.1*  PLT 169 188   BMET Recent Labs    03/06/18 0435 03/06/18 0605 03/07/18 0342  NA 139 139 140  K 3.6 3.7 4.2  CL 114* 114* 116*  CO2 19* 21* 18*  GLUCOSE 138* 140* 163*  BUN 27* 26* 29*  CREATININE 1.43* 1.40* 1.49*  CALCIUM 7.8* 7.9* 7.8*   LFT Recent Labs    03/05/18 0402 03/06/18 0435 03/07/18 0342  ALBUMIN 1.9* 1.9* 2.0*   PT/INR Recent Labs    03/06/18 0435 03/07/18 0342  LABPROT 17.2* 16.5*  INR 1.42 1.34   Hepatitis Panel No results for input(s): HEPBSAG,  HCVAB, HEPAIGM, HEPBIGM in the last 72 hours.  Studies/Results: Dg Ercp Biliary & Pancreatic Ducts  Result Date: 03/06/2018 CLINICAL DATA:  62 year old male with a history of postoperative bile leak. EXAM: ERCP TECHNIQUE: Multiple spot images obtained with the fluoroscopic device and submitted for interpretation post-procedure. FLUOROSCOPY TIME:  Fluoroscopy Time:  8 minutes 3 seconds reported COMPARISON:  None. FINDINGS: A total of 9 intraoperative saved images are submitted for review. The images demonstrate a flexible endoscope in the descending duodenum. Initially, the main pancreatic duct is catheterized and a pancreatic ductogram performed. Pancreatic duct appears normal. Subsequently, the common bile duct is catheterized and a cholangiogram was performed. A balloon occluded cholangiogram demonstrates extravasation of contrast from the stump of the cystic duct. No evidence of biliary obstruction, stenosis or stricture. On the final image, a plastic biliary stent has been placed to facilitate decompression. IMPRESSION: 1. Positive for bile leak emanating from the cystic duct stump. 2. Successful placement of a plastic biliary stent. These images were submitted for radiologic interpretation only. Please see the procedural report for the amount of contrast and the fluoroscopy time utilized. Electronically Signed   By: Jacqulynn Cadet M.D.   On: 03/06/2018 16:53    ASSESMENT:   *   Post cholecystectomy bile leak  03/06/18 ERCP, sphincterotomy and plastic biliary stent placement.    *   Chronic Coumadin, resumed 10/31 PM  *    [place] anemia, improved.  This arose this admission in setting of acute illness, sepsis.      PLAN   *   ERCP with stent removal in 6 to 8 weeks.  Will arrange GI fup.    *   HH, carb mod diet.    *   Continue daily PPI, per home routine    Azucena Freed  03/07/2018, 9:56 AM Phone [phone]

## 2018-03-07 NOTE — Clinical Social Work Note (Signed)
CSW continuing to monitor patient's progress and he is not medically ready at this time. Patient will discharge to [facility] when medically stable. Facility will have to initiate authorization again once patient ready for discharge.  Willis Kuipers Givens, MSW, LCSW Licensed Clinical Social Worker [place] [phone]

## 2018-03-08 LAB — PROTIME-INR
INR: 1.3
Prothrombin Time: 16.1 seconds — ABNORMAL HIGH (ref 11.4–15.2)

## 2018-03-08 LAB — RENAL FUNCTION PANEL
Albumin: 1.8 g/dL — ABNORMAL LOW (ref 3.5–5.0)
Anion gap: 3 — ABNORMAL LOW (ref 5–15)
BUN: 29 mg/dL — ABNORMAL HIGH (ref 8–23)
CHLORIDE: 117 mmol/L — AB (ref 98–111)
CO2: 18 mmol/L — ABNORMAL LOW (ref 22–32)
CREATININE: 1.62 mg/dL — AB (ref 0.61–1.24)
Calcium: 7.5 mg/dL — ABNORMAL LOW (ref 8.9–10.3)
GFR calc Af Amer: 51 mL/min — ABNORMAL LOW (ref >60)
GFR calc non Af Amer: 44 mL/min — ABNORMAL LOW (ref >60)
Glucose, Bld: 72 mg/dL (ref 70–99)
PHOSPHORUS: 3 mg/dL (ref 2.5–4.6)
POTASSIUM: 3.3 mmol/L — AB (ref 3.5–5.1)
Sodium: 138 mmol/L (ref 135–145)

## 2018-03-08 LAB — CBC WITH DIFFERENTIAL/PLATELET
Abs Immature Granulocytes: 0.07 10*3/uL (ref 0.00–0.07)
BASOS PCT: 0 %
Basophils Absolute: 0 10*3/uL (ref 0.0–0.1)
EOS ABS: 0.1 10*3/uL (ref 0.0–0.5)
Eosinophils Relative: 1 %
HCT: 31.9 % — ABNORMAL LOW (ref 39.0–52.0)
Hemoglobin: 9.1 g/dL — ABNORMAL LOW (ref 13.0–17.0)
Immature Granulocytes: 1 %
Lymphocytes Relative: 8 %
Lymphs Abs: 1.1 10*3/uL (ref 0.7–4.0)
MCH: 26.8 pg (ref 26.0–34.0)
MCHC: 28.5 g/dL — ABNORMAL LOW (ref 30.0–36.0)
MCV: 94.1 fL (ref 80.0–100.0)
MONO ABS: 0.7 10*3/uL (ref 0.1–1.0)
Monocytes Relative: 5 %
NEUTROS PCT: 85 %
Neutro Abs: 11.7 10*3/uL — ABNORMAL HIGH (ref 1.7–7.7)
PLATELETS: 159 10*3/uL (ref 150–400)
RBC: 3.39 MIL/uL — AB (ref 4.22–5.81)
RDW: 16.2 % — AB (ref 11.5–15.5)
WBC: 13.6 10*3/uL — AB (ref 4.0–10.5)
nRBC: 0 % (ref 0.0–0.2)

## 2018-03-08 LAB — COMPREHENSIVE METABOLIC PANEL
ALT: 14 U/L (ref 0–44)
ANION GAP: 3 — AB (ref 5–15)
AST: 44 U/L — ABNORMAL HIGH (ref 15–41)
Albumin: 1.8 g/dL — ABNORMAL LOW (ref 3.5–5.0)
Alkaline Phosphatase: 85 U/L (ref 38–126)
BUN: 28 mg/dL — ABNORMAL HIGH (ref 8–23)
CHLORIDE: 117 mmol/L — AB (ref 98–111)
CO2: 18 mmol/L — AB (ref 22–32)
CREATININE: 1.54 mg/dL — AB (ref 0.61–1.24)
Calcium: 7.4 mg/dL — ABNORMAL LOW (ref 8.9–10.3)
GFR calc Af Amer: 54 mL/min — ABNORMAL LOW (ref >60)
GFR, EST NON AFRICAN AMERICAN: 47 mL/min — AB (ref >60)
Glucose, Bld: 175 mg/dL — ABNORMAL HIGH (ref 70–99)
POTASSIUM: 3.5 mmol/L (ref 3.5–5.1)
SODIUM: 138 mmol/L (ref 135–145)
Total Bilirubin: 0.3 mg/dL (ref 0.3–1.2)
Total Protein: 4.8 g/dL — ABNORMAL LOW (ref 6.5–8.1)

## 2018-03-08 LAB — GLUCOSE, CAPILLARY
GLUCOSE-CAPILLARY: 64 mg/dL — AB (ref 70–99)
GLUCOSE-CAPILLARY: 144 mg/dL — AB (ref 70–99)
GLUCOSE-CAPILLARY: 178 mg/dL — AB (ref 70–99)
Glucose-Capillary: 100 mg/dL — ABNORMAL HIGH (ref 70–99)
Glucose-Capillary: 129 mg/dL — ABNORMAL HIGH (ref 70–99)

## 2018-03-08 LAB — MAGNESIUM: MAGNESIUM: 1.5 mg/dL — AB (ref 1.7–2.4)

## 2018-03-08 LAB — PHOSPHORUS: PHOSPHORUS: 2.8 mg/dL (ref 2.5–4.6)

## 2018-03-08 MED ORDER — MAGNESIUM SULFATE 4 GM/100ML IV SOLN
4.0000 g | Freq: Once | INTRAVENOUS | Status: AC
  Administered 2018-03-08: 4 g via INTRAVENOUS
  Filled 2018-03-08: qty 100

## 2018-03-08 MED ORDER — WARFARIN SODIUM 2.5 MG PO TABS
2.5000 mg | ORAL_TABLET | Freq: Once | ORAL | Status: AC
  Administered 2018-03-08: 2.5 mg via ORAL
  Filled 2018-03-08: qty 1

## 2018-03-08 NOTE — Progress Notes (Signed)
PROGRESS NOTE  [name] NWG:[id number] DOB: 1956-02-02 DOA: 01/27/2018 PCP: [name], MD  HPI/Recap of past 24 hours:  62 y.o.malewith a history of sepsis due to right foot osteomyelitis s/p transtibial amputation and found to have MSSA bacteremia with splenic infarct and tricuspid valve endocarditis. He was discharged on ancef to [facility] but was quickly brought back in due to oliguria and found to have acute renal failure. He developed acute respiratory failure and transferred to ICU on CCM service. Antibiotics changed to daptomycin per ID and nephrology oversaw hemodialysis through right IJ temporary catheter on 10/12 and 10/14. With improvement in respiratory status he was transferred to the medical floor. Nephrology following closely for signs of renal recovery. Creatinine trending downward as of 10/17 with increasing urine output.Temporary HD catheter removed 10/21. On 10/22 the patient developed acute abdominal pain worse on the right with nausea, vomiting, and inability to take po. CT abd/pelvis demonstrated distended gallbladder with gallstones concerning for cholecystitis. Also showed anasarca, ascites, and pleural effusions, progressive T12-L1 changes noticed on previous study, and stable splenic lesions.  03/05/2018: Patient seen and examined at his bedside.  No acute events overnight.  Patient has no new complaints.  He denies any right upper quadrant pain or nausea.  General surgery following.  Possible ERCP by GI tomorrow.  N.p.o. after midnight.  03/06/18: seen and examined with brother at bedside. No significant pain from ruq abd. No nausea. Still significant output from surgical drain. ERCP planned this afternoon.  03/07/2018: Patient seen and examined at his bedside.  He is ERCP POD #1.  Reports mild pain at his right lower quadrant at the site of surgical drain.  Denies nausea.  Also denies any cardiopulmonary symptoms.  03/08/2018: Patient seen and  examined at his bedside.  No acute events overnight.  ERCP POD #2.  Reports no pain this morning.  Denies nausea.  Denies any cardiopulmonary symptoms.  Assessment/Plan: Principal Problem:   Acute renal failure (ARF) (HCC) Active Problems:   Insulin-requiring or dependent type II diabetes mellitus ([place])   Essential hypertension   CAD (coronary artery disease)   Leukocytosis   Thrombocytopenia (HCC)   S/P BKA (below knee amputation) unilateral, right (HCC)   Endocarditis of tricuspid valve   Hyponatremia   PAF (paroxysmal atrial fibrillation) (HCC)   Aortic atherosclerosis (HCC)   Acute hypoxemic respiratory failure (HCC)   AKI (acute kidney injury) ([place])   Acute gangrenous cholecystitis s/p lap cholecystectomy 02/28/2018   Bile leak, postoperative  Improving AKI on CKD 3 Creatinine today 1.54 from 1.62  Continue gentle IV fluid hydration Continue to monitor urine output Repeat creatinine in the morning Continue to avoid nephrotoxic agents   Acute gangrenous cholecystitis POD #8 status post laparoscopic cholecystectomy and placement of 19 F. Blake drain with concern for a leak ERCP completed on 03/06/2018 by GI  Hypomagnesemia Magnesium level 1.5 Repleted with 4 g IV magnesium  Biliary leak post ERCP with biliary stent placement Appears to be stable at this time We will follow-up with GI outpatient in 6 to 8 weeks. Resume Coumadin postprocedure Continue to monitor output from surgical drain  MSSA bacteremia and tricuspid valve endocarditis complicated by splenic infarcts with sepsis ID recommendations to complete daptomycin for 6 weeks treatment through 03/08/2018  Obtain CPK weekly  Following completion of daptomycin, ID recommends starting doxycycline 100 mg p.o. twice daily on a full stomach Continue to monitor fever curve and WBC Obtain CBC in the morning  P.Afib on coumadin  Resume Coumadin INR is currently sub-therapeutic 1.39 Pharmacy managing  Coumadin  Subtherapeutic INR INR 1.30 Goal between 2 and 3 Continue Coumadin Pharmacy managing.  Highly appreciated. Obtain INR in the morning  Hypoglycemia  CBG 64 early this morning Decrease Lantus dose from 22 units to 18 units nightly Continue NovoLog 4 units before meals x3 times a day Continue insulin sliding scale Last A1c was 8.5 on 01/16/2018  T12-L1 discitis/osteomyelitis Continue doxycycline after completion of daptomycin  Severe PVD status post bilateral transtibial amputations Plan for prosthetic fitting outpatient with Dr. Sharol Given Fall precaution Continue to work with physical therapy  Type 2 diabetes complicated by hyperglycemia Last A1c 8.5  Management as stated above   Code Status: Full code  Family Communication: None at bedside  Disposition Plan: Home possibly tomorrow on Monday if INR is close to therapeutic level.   Consultants:  General surgery  GI  Infectious disease  Pharmacy  Procedures:  Temporary HD catheter  Cholecystectomy 02/28/2018  ERCP on 03/06/2018  Antimicrobials:  Daptomycin  DVT prophylaxis: Coumadin   Objective: Vitals:   03/07/18 1617 03/07/18 2010 03/08/18 0447 03/08/18 0747  BP: 117/62 131/69 135/72 (!) 129/91  Pulse: 81 80 82 87  Resp: 19 18 16 18   Temp: 98.7 F (37.1 C) (!) 97.5 F (36.4 C) 97.8 F (36.6 C) 98.2 F (36.8 C)  TempSrc: Oral Oral Oral Oral  SpO2: 100% 100% 99% 98%  Weight:      Height:        Intake/Output Summary (Last 24 hours) at 03/08/2018 1432 Last data filed at 03/08/2018 1336 Gross per 24 hour  Intake 2903.59 ml  Output 2200 ml  Net 703.59 ml   Filed Weights   03/04/18 1049 03/05/18 2050 03/06/18 1300  Weight: 105 kg 105 kg 105 kg    Exam:  . General: 62 y.o. year-old male pleasant well-developed well-nourished in no acute distress.  Alert oriented x3.   . Cardiovascular: Regular rate and rhythm with no rubs or gallops.  No JVD or thyromegaly noted.   Marland Kitchen Respiratory:  Clear to auscultation with no wheezes or rales.  Good inspiratory effort.   . Abdomen: Soft nontender nondistended with normal bowel sounds x4 quadrants.  Surgical drain located at right lower quadrant with no erythema no discharge noted.  Moderate amount of clear yellow output noted from surgical drainage.   . Musculoskeletal: Bilateral transtibial amputee. Marland Kitchen Psychiatry: Mood is appropriate for condition and setting   Data Reviewed: CBC: Recent Labs  Lab 03/02/18 0348 03/05/18 0402 03/06/18 0605 03/08/18 1041  WBC 12.6* 13.4* 12.1* 13.6*  NEUTROABS  --   --  10.0* 11.7*  HGB 9.1* 9.8* 10.7* 9.1*  HCT 29.6* 32.9* 36.1* 31.9*  MCV 90.2 92.4 91.4 94.1  PLT 192 169 188 194   Basic Metabolic Panel: Recent Labs  Lab 03/02/18 0342  03/05/18 0402 03/06/18 0435 03/06/18 0605 03/07/18 0342 03/08/18 0521 03/08/18 1041  NA 138   < > 141 139 139 140 138 138  K 3.0*   < > 3.7 3.6 3.7 4.2 3.3* 3.5  CL 107   < > 116* 114* 114* 116* 117* 117*  CO2 18*   < > 20* 19* 21* 18* 18* 18*  GLUCOSE 127*   < > 115* 138* 140* 163* 72 175*  BUN 78*   < > 35* 27* 26* 29* 29* 28*  CREATININE 2.55*   < > 1.58* 1.43* 1.40* 1.49* 1.62* 1.54*  CALCIUM 7.6*   < >  7.7* 7.8* 7.9* 7.8* 7.5* 7.4*  MG 1.5*  --   --   --   --   --   --  1.5*  PHOS 4.6   < > 2.5 2.6  --  4.3 3.0 2.8   < > = values in this interval not displayed.   GFR: Estimated Creatinine Clearance: 61.1 mL/min (A) (by C-G formula based on SCr of 1.54 mg/dL (H)). Liver Function Tests: Recent Labs  Lab 03/03/18 0404  03/05/18 0402 03/06/18 0435 03/07/18 0342 03/08/18 0521 03/08/18 1041  AST 56*  --   --   --   --   --  44*  ALT 20  --   --   --   --   --  14  ALKPHOS 102  --   --   --   --   --  85  BILITOT 0.5  --   --   --   --   --  0.3  PROT 5.1*  --   --   --   --   --  4.8*  ALBUMIN 1.9*   < > 1.9* 1.9* 2.0* 1.8* 1.8*   < > = values in this interval not displayed.   No results for input(s): LIPASE, AMYLASE in the last 168  hours. No results for input(s): AMMONIA in the last 168 hours. Coagulation Profile: Recent Labs  Lab 03/04/18 0321 03/05/18 0402 03/06/18 0435 03/07/18 0342 03/08/18 0521  INR 1.99 1.89 1.42 1.34 1.30   Cardiac Enzymes: No results for input(s): CKTOTAL, CKMB, CKMBINDEX, TROPONINI in the last 168 hours. BNP (last 3 results) No results for input(s): PROBNP in the last 8760 hours. HbA1C: No results for input(s): HGBA1C in the last 72 hours. CBG: Recent Labs  Lab 03/07/18 1629 03/07/18 2157 03/08/18 0745 03/08/18 1029 03/08/18 1202  GLUCAP 193* 104* 64* 144* 178*   Lipid Profile: No results for input(s): CHOL, HDL, LDLCALC, TRIG, CHOLHDL, LDLDIRECT in the last 72 hours. Thyroid Function Tests: No results for input(s): TSH, T4TOTAL, FREET4, T3FREE, THYROIDAB in the last 72 hours. Anemia Panel: No results for input(s): VITAMINB12, FOLATE, FERRITIN, TIBC, IRON, RETICCTPCT in the last 72 hours. Urine analysis:    Component Value Date/Time   COLORURINE YELLOW 02/28/2018 2241   APPEARANCEUR HAZY (A) 02/28/2018 2241   LABSPEC 1.011 02/28/2018 2241   PHURINE 5.0 02/28/2018 2241   GLUCOSEU NEGATIVE 02/28/2018 2241   HGBUR LARGE (A) 02/28/2018 2241   BILIRUBINUR NEGATIVE 02/28/2018 2241   KETONESUR NEGATIVE 02/28/2018 2241   PROTEINUR 100 (A) 02/28/2018 2241   UROBILINOGEN 0.2 07/28/2014 0433   NITRITE NEGATIVE 02/28/2018 2241   LEUKOCYTESUR MODERATE (A) 02/28/2018 2241   Sepsis Labs: @LABRCNTIP (procalcitonin:4,lacticidven:4)  ) Recent Results (from the past 240 hour(s))  Culture, Urine     Status: None   Collection Time: 02/28/18 10:34 AM  Result Value Ref Range Status   Specimen Description URINE, RANDOM  Final   Special Requests NONE  Final   Culture   Final    NO GROWTH Performed at [facility], [place] [address], [place], [place] [address]    Report Status 03/01/2018 FINAL  Final  Culture, Urine     Status: None   Collection Time: 02/28/18 10:34 PM   Result Value Ref Range Status   Specimen Description URINE, RANDOM  Final   Special Requests NONE  Final   Culture   Final    NO GROWTH Performed at [facility], [place]  [address] [place], [place] [address]    Report Status 03/02/2018 FINAL  Final      Studies: No results found.  Scheduled Meds: . Chlorhexidine Gluconate Cloth  6 each Topical Q0600  . [START ON 03/13/2018] doxycycline  100 mg Oral Q12H  . insulin aspart  0-5 Units Subcutaneous QHS  . insulin aspart  0-9 Units Subcutaneous TID WC  . insulin aspart  4 Units Subcutaneous TID WC  . insulin glargine  22 Units Subcutaneous QHS  . latanoprost  1 drop Both Eyes QHS  . mouth rinse  15 mL Mouth Rinse BID  . metoprolol tartrate  50 mg Oral BID  . multivitamin  1 tablet Oral QHS  . nystatin   Topical BID  . pantoprazole  40 mg Oral Q1200  . saccharomyces boulardii  250 mg Oral BID  . traZODone  50 mg Oral QHS  . Warfarin - Pharmacist Dosing Inpatient   Does not apply q1800    Continuous Infusions: . sodium chloride 0 mL/hr at 02/28/18 1202  . sodium chloride    . sodium chloride 75 mL/hr at 03/08/18 0516  . DAPTOmycin (CUBICIN)  IV 850 mg (03/07/18 2136)  . magnesium sulfate 1 - 4 g bolus IVPB       LOS: 40 days     Kayleen Memos, MD [facility] Pager [phone]  If 7PM-7AM, please contact night-coverage [email or web address] Password [facility] 03/08/2018, 2:32 PM

## 2018-03-08 NOTE — Progress Notes (Signed)
ANTICOAGULATION CONSULT NOTE - Follow Up Consult  Pharmacy Consult for Coumadin  Indication: atrial fibrillation  Allergies  Allergen Reactions  . Ace Inhibitors Cough  . Cefazolin     Possible cause of AKI, AIN    Patient Measurements: Height: 5\' 10"  (177.8 cm) Weight: 231 lb 7.7 oz (105 kg) IBW/kg (Calculated) : 73   Vital Signs: Temp: 98.2 F (36.8 C) (11/02 0747) Temp Source: Oral (11/02 0747) BP: 129/91 (11/02 0747) Pulse Rate: 87 (11/02 0747)  Labs: Recent Labs    03/06/18 0435 03/06/18 0605 03/07/18 0342 03/08/18 0521 03/08/18 1041  HGB  --  10.7*  --   --  9.1*  HCT  --  36.1*  --   --  31.9*  PLT  --  188  --   --  159  LABPROT 17.2*  --  16.5* 16.1*  --   INR 1.42  --  1.34 1.30  --   CREATININE 1.43* 1.40* 1.49* 1.62* 1.54*    Estimated Creatinine Clearance: 61.1 mL/min (A) (by C-G formula based on SCr of 1.54 mg/dL (H)).   Assessment: Coumadin for atrial fibrillation initially was resumed on 10/27 with small dose of 0.5 mg then held for ERCP.  Vitamin K 5 mg PO given on 10/30 and INR down to 1.42 on 10/31 and okay'd to resume Coumadin post-ERCP/stent per GI.  Previously therapeutic on Coumadin but some sensitivity, with INR trending >3 on 2.5 mg daily.    INR down to 1.30 after Coumadin 2 mg x 1 on 10/31 and 2.5mg  last night.   INR goal 2-3.  Hgb 9.1 low/stable and pltc wnl at 159k.   No bleeding reported.  Dr. [place] Crane noted no acute events overnight.  ERCP POD #2  Goal of Therapy:  INR 2-3 Monitor platelets by anticoagulation protocol: Yes   Plan:  Coumadin 2.5 mg x 1 tonight.  Continue daily PT/INR.  Nicole Cella, RPh Clinical Pharmacist [phone] Please check AMION for all [place] phone numbers After 10:00 PM, call [place] [phone] 03/08/2018,3:42 PM

## 2018-03-09 LAB — RENAL FUNCTION PANEL
Albumin: 1.7 g/dL — ABNORMAL LOW (ref 3.5–5.0)
Anion gap: 6 (ref 5–15)
BUN: 25 mg/dL — AB (ref 8–23)
CALCIUM: 7.5 mg/dL — AB (ref 8.9–10.3)
CO2: 18 mmol/L — AB (ref 22–32)
CREATININE: 1.39 mg/dL — AB (ref 0.61–1.24)
Chloride: 115 mmol/L — ABNORMAL HIGH (ref 98–111)
GFR calc Af Amer: >60 mL/min (ref >60)
GFR calc non Af Amer: 53 mL/min — ABNORMAL LOW (ref >60)
Glucose, Bld: 89 mg/dL (ref 70–99)
Phosphorus: 2.6 mg/dL (ref 2.5–4.6)
Potassium: 3.5 mmol/L (ref 3.5–5.1)
SODIUM: 139 mmol/L (ref 135–145)

## 2018-03-09 LAB — GLUCOSE, CAPILLARY
Glucose-Capillary: 58 mg/dL — ABNORMAL LOW (ref 70–99)
Glucose-Capillary: 71 mg/dL (ref 70–99)
Glucose-Capillary: 98 mg/dL (ref 70–99)
Glucose-Capillary: 174 mg/dL — ABNORMAL HIGH (ref 70–99)
Glucose-Capillary: 117 mg/dL — ABNORMAL HIGH (ref 70–99)

## 2018-03-09 LAB — PROTIME-INR
INR: 1.52
PROTHROMBIN TIME: 18.1 s — AB (ref 11.4–15.2)

## 2018-03-09 MED ORDER — INSULIN GLARGINE 100 UNIT/ML ~~LOC~~ SOLN
15.0000 [IU] | Freq: Every day | SUBCUTANEOUS | Status: DC
  Administered 2018-03-09 – 2018-03-10 (×2): 15 [IU] via SUBCUTANEOUS
  Filled 2018-03-09 (×3): qty 0.15

## 2018-03-09 MED ORDER — WARFARIN SODIUM 2.5 MG PO TABS
2.5000 mg | ORAL_TABLET | ORAL | Status: AC
  Administered 2018-03-09: 2.5 mg via ORAL
  Filled 2018-03-09: qty 1

## 2018-03-09 NOTE — Progress Notes (Signed)
ANTICOAGULATION CONSULT NOTE - Follow Up Consult  Pharmacy Consult for Coumadin  Indication: atrial fibrillation  Allergies  Allergen Reactions  . Ace Inhibitors Cough  . Cefazolin     Possible cause of AKI, AIN    Patient Measurements: Height: 5\' 10"  (177.8 cm) Weight: 231 lb 7.7 oz (105 kg) IBW/kg (Calculated) : 73   Vital Signs: Temp: 98.3 F (36.8 C) (11/03 2012) Temp Source: Oral (11/03 2012) BP: 135/67 (11/03 2012) Pulse Rate: 85 (11/03 2012)  Labs: Recent Labs    03/07/18 0342 03/08/18 0521 03/08/18 1041 03/09/18 0313  HGB  --   --  9.1*  --   HCT  --   --  31.9*  --   PLT  --   --  159  --   LABPROT 16.5* 16.1*  --  18.1*  INR 1.34 1.30  --  1.52  CREATININE 1.49* 1.62* 1.54* 1.39*    Estimated Creatinine Clearance: 67.7 mL/min (A) (by C-G formula based on SCr of 1.39 mg/dL (H)).   Assessment: Coumadin for atrial fibrillation initially was resumed on 10/27 with small dose of 0.5 mg then held for ERCP.  S/p  Vitamin K 5 mg PO on 10/30.  GI okay'd to resume Coumadin.  Previously therapeutic on Coumadin but some sensitivity, with INR trending >3 on 2.5 mg daily.     ERCP POD #3 INR = 1.52 today 11/4, starting to trend up toward goal.   No bleeding reported.    Goal of Therapy:  INR 2-3 Monitor platelets by anticoagulation protocol: Yes   Plan:  Coumadin 2.5 mg x 1 tonight.  Continue daily PT/INR.  Nicole Cella, RPh Clinical Pharmacist [phone] Please check AMION for all [place] phone numbers After 10:00 PM, call [place] [phone] 03/09/2018,10:37 PM

## 2018-03-09 NOTE — Progress Notes (Signed)
PROGRESS NOTE  [name] YQI:[id number] DOB: 05-16-1955 DOA: 01/27/2018 PCP: [name], MD  HPI/Recap of past 24 hours:  62 y.o.malewith a history of sepsis due to right foot osteomyelitis s/p transtibial amputation and found to have MSSA bacteremia with splenic infarct and tricuspid valve endocarditis. He was discharged on ancef to [facility] but was quickly brought back in due to oliguria and found to have acute renal failure. He developed acute respiratory failure and transferred to ICU on CCM service. Antibiotics changed to daptomycin per ID and nephrology oversaw hemodialysis through right IJ temporary catheter on 10/12 and 10/14. With improvement in respiratory status he was transferred to the medical floor. Nephrology following closely for signs of renal recovery. Creatinine trending downward as of 10/17 with increasing urine output.Temporary HD catheter removed 10/21. On 10/22 the patient developed acute abdominal pain worse on the right with nausea, vomiting, and inability to take po. CT abd/pelvis demonstrated distended gallbladder with gallstones concerning for cholecystitis. Also showed anasarca, ascites, and pleural effusions, progressive T12-L1 changes noticed on previous study, and stable splenic lesions.  03/05/2018: Patient seen and examined at his bedside.  No acute events overnight.  Patient has no new complaints.  He denies any right upper quadrant pain or nausea.  General surgery following.  Possible ERCP by GI tomorrow.  N.p.o. after midnight.  03/06/18: seen and examined with brother at bedside. No significant pain from ruq abd. No nausea. Still significant output from surgical drain. ERCP planned this afternoon.  03/07/2018: Patient seen and examined at his bedside.  He is ERCP POD #1.  Reports mild pain at his right lower quadrant at the site of surgical drain.  Denies nausea.  Also denies any cardiopulmonary symptoms.  03/08/2018: Patient seen and  examined at his bedside.  No acute events overnight.  ERCP POD #2.  Reports no pain this morning.  Denies nausea.  Denies any cardiopulmonary symptoms.  03/09/2018: Patient seen and examined at his bedside.  Hypoglycemic early this morning.  Decreased dose of Lantus which is given at night.  From 22 units to 15 units.  Has no other complaints.  INR subtherapeutic.  Assessment/Plan: Principal Problem:   Acute renal failure (ARF) (HCC) Active Problems:   Insulin-requiring or dependent type II diabetes mellitus ([place])   Essential hypertension   CAD (coronary artery disease)   Leukocytosis   Thrombocytopenia (HCC)   S/P BKA (below knee amputation) unilateral, right (HCC)   Endocarditis of tricuspid valve   Hyponatremia   PAF (paroxysmal atrial fibrillation) (HCC)   Aortic atherosclerosis (HCC)   Acute hypoxemic respiratory failure (HCC)   AKI (acute kidney injury) ([place])   Acute gangrenous cholecystitis s/p lap cholecystectomy 02/28/2018   Bile leak, postoperative  Improving AKI on CKD 3 Creatinine today 1.39 from 1.54 from 1.62  Stop IV fluid hydration and encourage increase oral fluid intake Continue to avoid nephrotoxic agents  Acute gangrenous cholecystitis POD #8 status post laparoscopic cholecystectomy and placement of 19 F. Blake drain with concern for a leak ERCP completed on 03/06/2018 by GI  Biliary leak post ERCP with biliary stent placement Appears to be stable at this time We will follow-up with GI outpatient in 6 to 8 weeks. Continue Coumadin postprocedure Continue to monitor output from surgical drain  MSSA bacteremia and tricuspid valve endocarditis complicated by splenic infarcts with sepsis ID recommendations to complete daptomycin for 6 weeks treatment through 03/08/2018  Obtain CPK weekly  Following completion of daptomycin, ID recommends starting doxycycline 100  mg p.o. twice daily on a full stomach Continue to monitor fever curve and WBC Obtain CBC in the  morning  P.Afib on coumadin Continue Coumadin INR is currently sub-therapeutic but improving 1.52 from 1.30 Pharmacy managing Coumadin  Subtherapeutic INR INR 1.52 Goal between 2 and 3 Continue Coumadin Pharmacy managing.  Highly appreciated. Obtain INR in the morning  Recurrent hypoglycemia  CBG 64 early this morning Decrease Lantus dose from 22 units to 15 units nightly Continue NovoLog 4 units before meals x3 times a day Continue insulin sliding scale Last A1c was 8.5 on 01/16/2018  T12-L1 discitis/osteomyelitis Continue doxycycline after completion of daptomycin  Severe PVD status post bilateral transtibial amputations Plan for prosthetic fitting outpatient with Dr. Sharol Given Fall precaution Continue to work with physical therapy  Type 2 diabetes complicated by hyperglycemia Last A1c 8.5  Management as stated above   Code Status: Full code  Family Communication: None at bedside  Disposition Plan: SNF possibly tomorrow Monday, 03/10/2018  Consultants:  General surgery  GI  Infectious disease  Pharmacy  Procedures:  Temporary HD catheter  Cholecystectomy 02/28/2018  ERCP on 03/06/2018  Antimicrobials:  Daptomycin  DVT prophylaxis: Coumadin   Objective: Vitals:   03/08/18 1610 03/08/18 2115 03/09/18 0535 03/09/18 0742  BP: 130/66 (!) 147/74 (!) 144/70 138/75  Pulse: 83 84 85 85  Resp: 18 16 16 19   Temp: 98 F (36.7 C) 98.7 F (37.1 C) 98.4 F (36.9 C) 98.2 F (36.8 C)  TempSrc: Oral Oral Oral Oral  SpO2: 100% 100% 99% 100%  Weight:      Height:        Intake/Output Summary (Last 24 hours) at 03/09/2018 1618 Last data filed at 03/09/2018 1535 Gross per 24 hour  Intake 3567.64 ml  Output 3140 ml  Net 427.64 ml   Filed Weights   03/04/18 1049 03/05/18 2050 03/06/18 1300  Weight: 105 kg 105 kg 105 kg    Exam:  . General: 62 y.o. year-old male pleasant well-developed well-nourished in no acute distress.  Alert oriented x3.    . Cardiovascular: Regular rate and rhythm with no rubs or gallops.  No JVD or thyromegaly noted. Marland Kitchen Respiratory: Clear to auscultation with no wheezes or rales.  Good inspiratory effort.   . Abdomen: Soft nontender nondistended with normal bowel sounds x4 quadrants.  Surgical drain located at right lower quadrant with no erythema no discharge noted.  Moderate amount of clear yellow output noted from surgical drainage.   . Musculoskeletal: Bilateral transtibial amputee. Marland Kitchen Psychiatry: Mood is appropriate for condition and setting   Data Reviewed: CBC: Recent Labs  Lab 03/05/18 0402 03/06/18 0605 03/08/18 1041  WBC 13.4* 12.1* 13.6*  NEUTROABS  --  10.0* 11.7*  HGB 9.8* 10.7* 9.1*  HCT 32.9* 36.1* 31.9*  MCV 92.4 91.4 94.1  PLT 169 188 778   Basic Metabolic Panel: Recent Labs  Lab 03/06/18 0435 03/06/18 0605 03/07/18 0342 03/08/18 0521 03/08/18 1041 03/09/18 0313  NA 139 139 140 138 138 139  K 3.6 3.7 4.2 3.3* 3.5 3.5  CL 114* 114* 116* 117* 117* 115*  CO2 19* 21* 18* 18* 18* 18*  GLUCOSE 138* 140* 163* 72 175* 89  BUN 27* 26* 29* 29* 28* 25*  CREATININE 1.43* 1.40* 1.49* 1.62* 1.54* 1.39*  CALCIUM 7.8* 7.9* 7.8* 7.5* 7.4* 7.5*  MG  --   --   --   --  1.5*  --   PHOS 2.6  --  4.3 3.0 2.8 2.6  GFR: Estimated Creatinine Clearance: 67.7 mL/min (A) (by C-G formula based on SCr of 1.39 mg/dL (H)). Liver Function Tests: Recent Labs  Lab 03/03/18 0404  03/06/18 0435 03/07/18 0342 03/08/18 0521 03/08/18 1041 03/09/18 0313  AST 56*  --   --   --   --  44*  --   ALT 20  --   --   --   --  14  --   ALKPHOS 102  --   --   --   --  85  --   BILITOT 0.5  --   --   --   --  0.3  --   PROT 5.1*  --   --   --   --  4.8*  --   ALBUMIN 1.9*   < > 1.9* 2.0* 1.8* 1.8* 1.7*   < > = values in this interval not displayed.   No results for input(s): LIPASE, AMYLASE in the last 168 hours. No results for input(s): AMMONIA in the last 168 hours. Coagulation Profile: Recent Labs   Lab 03/05/18 0402 03/06/18 0435 03/07/18 0342 03/08/18 0521 03/09/18 0313  INR 1.89 1.42 1.34 1.30 1.52   Cardiac Enzymes: No results for input(s): CKTOTAL, CKMB, CKMBINDEX, TROPONINI in the last 168 hours. BNP (last 3 results) No results for input(s): PROBNP in the last 8760 hours. HbA1C: No results for input(s): HGBA1C in the last 72 hours. CBG: Recent Labs  Lab 03/08/18 1609 03/08/18 2115 03/09/18 0741 03/09/18 0804 03/09/18 1115  GLUCAP 100* 129* 58* 71 98   Lipid Profile: No results for input(s): CHOL, HDL, LDLCALC, TRIG, CHOLHDL, LDLDIRECT in the last 72 hours. Thyroid Function Tests: No results for input(s): TSH, T4TOTAL, FREET4, T3FREE, THYROIDAB in the last 72 hours. Anemia Panel: No results for input(s): VITAMINB12, FOLATE, FERRITIN, TIBC, IRON, RETICCTPCT in the last 72 hours. Urine analysis:    Component Value Date/Time   COLORURINE YELLOW 02/28/2018 2241   APPEARANCEUR HAZY (A) 02/28/2018 2241   LABSPEC 1.011 02/28/2018 2241   PHURINE 5.0 02/28/2018 2241   GLUCOSEU NEGATIVE 02/28/2018 2241   HGBUR LARGE (A) 02/28/2018 2241   BILIRUBINUR NEGATIVE 02/28/2018 2241   KETONESUR NEGATIVE 02/28/2018 2241   PROTEINUR 100 (A) 02/28/2018 2241   UROBILINOGEN 0.2 07/28/2014 0433   NITRITE NEGATIVE 02/28/2018 2241   LEUKOCYTESUR MODERATE (A) 02/28/2018 2241   Sepsis Labs: @LABRCNTIP (procalcitonin:4,lacticidven:4)  ) Recent Results (from the past 240 hour(s))  Culture, Urine     Status: None   Collection Time: 02/28/18 10:34 AM  Result Value Ref Range Status   Specimen Description URINE, RANDOM  Final   Special Requests NONE  Final   Culture   Final    NO GROWTH Performed at [facility], [place] [address], [place], [place] [address]    Report Status 03/01/2018 FINAL  Final  Culture, Urine     Status: None   Collection Time: 02/28/18 10:34 PM  Result Value Ref Range Status   Specimen Description URINE, RANDOM  Final   Special Requests NONE  Final    Culture   Final    NO GROWTH Performed at [facility], [place] [address] [place], [place] [address]    Report Status 03/02/2018 FINAL  Final      Studies: No results found.  Scheduled Meds: . Chlorhexidine Gluconate Cloth  6 each Topical Q0600  . [START ON 03/13/2018] doxycycline  100 mg Oral Q12H  . insulin aspart  0-5 Units Subcutaneous QHS  .  insulin aspart  0-9 Units Subcutaneous TID WC  . insulin aspart  4 Units Subcutaneous TID WC  . insulin glargine  15 Units Subcutaneous QHS  . latanoprost  1 drop Both Eyes QHS  . mouth rinse  15 mL Mouth Rinse BID  . metoprolol tartrate  50 mg Oral BID  . multivitamin  1 tablet Oral QHS  . nystatin   Topical BID  . pantoprazole  40 mg Oral Q1200  . saccharomyces boulardii  250 mg Oral BID  . traZODone  50 mg Oral QHS  . Warfarin - Pharmacist Dosing Inpatient   Does not apply q1800    Continuous Infusions: . sodium chloride 0 mL/hr at 02/28/18 1202  . sodium chloride    . DAPTOmycin (CUBICIN)  IV 850 mg (03/08/18 2046)     LOS: 41 days     Kayleen Memos, MD [facility] Pager [phone]  If 7PM-7AM, please contact night-coverage [email or web address] Password [email or web address] 03/09/2018, 4:18 PM

## 2018-03-09 NOTE — Progress Notes (Signed)
03/09/18 9:49 AM  Late entry Hypoglycemic Event  CBG: 58 at 0741  Treatment: 15 GM carbohydrate snack  Symptoms: None  Follow-up CBG: Time:0804 CBG Result:71  Possible Reasons for Event: Inadequate meal intake  Comments/MD notified: Will continue to monitor    Ridgely Anastacio B

## 2018-03-10 LAB — CBC WITH DIFFERENTIAL/PLATELET
Abs Immature Granulocytes: 0.06 10*3/uL (ref 0.00–0.07)
BASOS ABS: 0 10*3/uL (ref 0.0–0.1)
Basophils Relative: 0 %
Eosinophils Absolute: 0.2 10*3/uL (ref 0.0–0.5)
Eosinophils Relative: 2 %
HEMATOCRIT: 32.9 % — AB (ref 39.0–52.0)
HEMOGLOBIN: 9.6 g/dL — AB (ref 13.0–17.0)
Immature Granulocytes: 1 %
LYMPHS ABS: 1 10*3/uL (ref 0.7–4.0)
LYMPHS PCT: 8 %
MCH: 27 pg (ref 26.0–34.0)
MCHC: 29.2 g/dL — AB (ref 30.0–36.0)
MCV: 92.7 fL (ref 80.0–100.0)
MONOS PCT: 6 %
Monocytes Absolute: 0.7 10*3/uL (ref 0.1–1.0)
NRBC: 0 % (ref 0.0–0.2)
Neutro Abs: 10.7 10*3/uL — ABNORMAL HIGH (ref 1.7–7.7)
Neutrophils Relative %: 83 %
Platelets: 147 10*3/uL — ABNORMAL LOW (ref 150–400)
RBC: 3.55 MIL/uL — ABNORMAL LOW (ref 4.22–5.81)
RDW: 15.9 % — ABNORMAL HIGH (ref 11.5–15.5)
WBC: 12.7 10*3/uL — ABNORMAL HIGH (ref 4.0–10.5)

## 2018-03-10 LAB — PROTIME-INR
INR: 1.47
PROTHROMBIN TIME: 17.6 s — AB (ref 11.4–15.2)

## 2018-03-10 LAB — RENAL FUNCTION PANEL
Albumin: 1.7 g/dL — ABNORMAL LOW (ref 3.5–5.0)
Anion gap: 3 — ABNORMAL LOW (ref 5–15)
BUN: 18 mg/dL (ref 8–23)
CHLORIDE: 117 mmol/L — AB (ref 98–111)
CO2: 18 mmol/L — AB (ref 22–32)
Calcium: 7.2 mg/dL — ABNORMAL LOW (ref 8.9–10.3)
Creatinine, Ser: 1.14 mg/dL (ref 0.61–1.24)
GFR calc non Af Amer: >60 mL/min (ref >60)
GLUCOSE: 151 mg/dL — AB (ref 70–99)
Phosphorus: 2.6 mg/dL (ref 2.5–4.6)
Potassium: 3.5 mmol/L (ref 3.5–5.1)
Sodium: 138 mmol/L (ref 135–145)

## 2018-03-10 LAB — GLUCOSE, CAPILLARY
GLUCOSE-CAPILLARY: 180 mg/dL — AB (ref 70–99)
GLUCOSE-CAPILLARY: 184 mg/dL — AB (ref 70–99)
Glucose-Capillary: 141 mg/dL — ABNORMAL HIGH (ref 70–99)
Glucose-Capillary: 105 mg/dL — ABNORMAL HIGH (ref 70–99)
Glucose-Capillary: 178 mg/dL — ABNORMAL HIGH (ref 70–99)

## 2018-03-10 MED ORDER — INSULIN GLARGINE 100 UNIT/ML ~~LOC~~ SOLN: 10.0000 [IU] | Freq: Every day | SUBCUTANEOUS | 0 refills | Status: AC

## 2018-03-10 MED ORDER — DOXYCYCLINE HYCLATE 100 MG PO TABS: 100.0000 mg | ORAL_TABLET | Freq: Two times a day (BID) | ORAL | 0 refills | Status: DC

## 2018-03-10 MED ORDER — INSULIN ASPART 100 UNIT/ML ~~LOC~~ SOLN: 4.0000 [IU] | Freq: Three times a day (TID) | SUBCUTANEOUS | 0 refills | Status: DC

## 2018-03-10 MED ORDER — WARFARIN SODIUM 4 MG PO TABS
4.0000 mg | ORAL_TABLET | Freq: Once | ORAL | Status: AC
  Administered 2018-03-10: 4 mg via ORAL
  Filled 2018-03-10: qty 1

## 2018-03-10 MED ORDER — SODIUM CHLORIDE 0.9 % IV SOLN: 850.0000 mg | Freq: Every day | INTRAVENOUS | 0 refills | Status: AC

## 2018-03-10 MED ORDER — METOPROLOL TARTRATE 50 MG PO TABS: 50.0000 mg | ORAL_TABLET | Freq: Two times a day (BID) | ORAL | 0 refills | Status: AC

## 2018-03-10 MED ORDER — INSULIN ASPART 100 UNIT/ML ~~LOC~~ SOLN: 0.0000 [IU] | Freq: Three times a day (TID) | SUBCUTANEOUS | 0 refills | Status: DC

## 2018-03-10 MED ORDER — DAPTOMYCIN IV (FOR PTA / DISCHARGE USE ONLY): 850.0000 mg | INTRAVENOUS | 0 refills | Status: DC

## 2018-03-10 MED ORDER — DOXYCYCLINE HYCLATE 100 MG PO TABS: 100.0000 mg | ORAL_TABLET | Freq: Two times a day (BID) | ORAL | 0 refills | Status: AC

## 2018-03-10 NOTE — Progress Notes (Signed)
Occupational Therapy Treatment Patient Details Name: [name] MRN: [id number] DOB: Sep 16, 1955 Today's Date: 03/10/2018    History of present illness Pt is a 62 y/o male with a complicated PMH hospitalized September for sepsis secondary to right foot osteomyelitis, underwent BKA, found to have MSSA bacteremia with splenic infarct and tricuspid valve endocarditis. He was discharged to SNF and developed acute oliguria - presented to the ED where he was found to have AKI.  Developed acute respiratory failure necessitating transfer to ICU.  Temporary dialysis catheter was placed for HD. s/p lap cholecystectomy 10/25.   OT comments  Pt progressing towards OT goals, presents supine in bed willing to participate in therapy session. Pt completing bed mobility with modA this session with additional focus on sitting balance EOB. Pt requiring UE support and intermittent minA for sitting balance, with increased challenge due to sitting on air mattress. Additional focus on UB/LB strengthening while supine in bed as precursor to functional transfer completion. Continue to recommend ST therapy services in SNF setting at time of discharge to progress pt's safety and independence with ADLs and mobility. Will continue to follow acutely.     Follow Up Recommendations  SNF;Supervision/Assistance - 24 hour    Equipment Recommendations  None recommended by OT          Precautions / Restrictions Precautions Precautions: Fall Precaution Comments: JP drain Required Braces or Orthoses: Other Brace/Splint Other Brace/Splint: shrinkers bil LEs Restrictions Weight Bearing Restrictions: No       Mobility Bed Mobility Overal bed mobility: Needs Assistance Bed Mobility: Supine to Sit;Sit to Supine     Supine to sit: Mod assist;HOB elevated Sit to supine: Mod assist   General bed mobility comments: assist for scooting hips/LEs over EOB using bed pad and to elevate trunk, increased effort; assist to  guide LEs when returning to supine., pt able to pull himself towards [facility] with minA using bil UEs in [place]                         Balance Overall balance assessment: Needs assistance Sitting-balance support: Bilateral upper extremity supported Sitting balance-Leahy Scale: Poor Sitting balance - Comments: reliance on UE support behind him with occasional min A on air mattress                                   ADL either performed or assessed with clinical judgement   ADL                                         General ADL Comments: pt reports completing ADLs earlier today; focus of session on bed mobility, sitting balance, and UB/LB strengthening                       Cognition Arousal/Alertness: Awake/alert Behavior During Therapy: WFL for tasks assessed/performed Overall Cognitive Status: Within Functional Limits for tasks assessed                                          Exercises General Exercises - Upper Extremity Shoulder Flexion: AROM;Theraband;Right;Left;10 reps Theraband Level (Shoulder Flexion): Level 1 (Yellow) Shoulder Extension: AROM;Right;Left;10 reps;Theraband Theraband Level (Shoulder  Extension): Level 1 (Yellow) Shoulder ABduction: AROM;10 reps;Both;Theraband Theraband Level (Shoulder Abduction): Level 1 (Yellow) Shoulder ADduction: AROM;10 reps;Both;Theraband Theraband Level (Shoulder Adduction): Level 1 (Yellow) Elbow Flexion: AROM;Right;Left;10 reps;Theraband Theraband Level (Elbow Flexion): Level 1 (Yellow) Elbow Extension: AROM;10 reps;Right;Left;Theraband Theraband Level (Elbow Extension): Level 1 (Yellow) Amputee Exercises Knee Flexion: AROM;Right;10 reps Knee Extension: AROM;10 reps;Both Straight Leg Raises: AROM;10 reps;Both Other Exercises Other Exercises: forward punches using level 1 theraband; theraband HEP issued   Shoulder Instructions       General Comments       Pertinent Vitals/ Pain       Pain Assessment: Faces Faces Pain Scale: Hurts little more Pain Location: low back Pain Descriptors / Indicators: Discomfort;Sore Pain Intervention(s): Monitored during session;Repositioned  Home Living                                          Prior Functioning/Environment              Frequency  Min 2X/week        Progress Toward Goals  OT Goals(current goals can now be found in the care plan section)  Progress towards OT goals: Progressing toward goals  Acute Rehab OT Goals Patient Stated Goal: hopeful for rehab tomorrow OT Goal Formulation: With patient Time For Goal Achievement: 03/24/18 Potential to Achieve Goals: Good  Plan Discharge plan remains appropriate    Co-evaluation                 AM-PAC PT "6 Clicks" Daily Activity     Outcome Measure   Help from another person eating meals?: None Help from another person taking care of personal grooming?: A Little Help from another person toileting, which includes using toliet, bedpan, or urinal?: A Lot Help from another person bathing (including washing, rinsing, drying)?: A Lot Help from another person to put on and taking off regular upper body clothing?: A Lot Help from another person to put on and taking off regular lower body clothing?: A Lot 6 Click Score: 15    End of Session    OT Visit Diagnosis: Muscle weakness (generalized) (M62.81)   Activity Tolerance Patient tolerated treatment well   Patient Left in bed;with call bell/phone within reach   Nurse Communication Mobility status        Time: 1104-1130 OT Time Calculation (min): 26 min  Charges: OT General Charges $OT Visit: 1 Visit OT Treatments $Therapeutic Activity: 23-37 mins  Lou Cal, OT [facility] Pager [phone] Office [phone]    [name] 03/10/2018, 1:12 PM

## 2018-03-10 NOTE — Discharge Summary (Addendum)
Discharge Summary  [name] ZDG:[id number] DOB: 30-Sep-1955  PCP: [name], MD  Admit date: 01/27/2018 Discharge date: 03/10/2018  Time spent: 35 minutes  Recommendations for Outpatient Follow-up:  1. Follow-up with general surgery 2. Follow-up with GI 3. Follow-up with orthopedic surgery 4. Continue physical therapy 5. Take your medications as prescribed 6. Fall precautions  Discharge Diagnoses:  Active Hospital Problems   Diagnosis Date Noted  . Acute renal failure (ARF) ([place]) 01/27/2018  . Bile leak, postoperative   . Acute gangrenous cholecystitis s/p lap cholecystectomy 02/28/2018 03/02/2018  . AKI (acute kidney injury) ([place])   . Acute hypoxemic respiratory failure ([place]) 02/06/2018  . PAF (paroxysmal atrial fibrillation) ([place]) 02/05/2018  . Aortic atherosclerosis ([place]) 02/05/2018  . Hyponatremia 01/27/2018  . 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 S/P BKA (below knee amputation) unilateral, right ([place]) 12/08/2014  . Leukocytosis 07/28/2014  . Thrombocytopenia ([place]) 07/28/2014  . Insulin-requiring or dependent type II diabetes mellitus ([place]) 04/19/2009  . Essential hypertension 04/19/2009  . CAD (coronary artery disease) 04/19/2009    Resolved Hospital Problems   Diagnosis Date Noted Date Resolved  . Respiratory distress  02/05/2018  . Hypoglycemia due to insulin 01/27/2018 02/05/2018    Discharge Condition: Stable  Diet recommendation: Resume previous diet  Vitals:   03/10/18 1021 03/10/18 1623  BP: (!) 151/78 (!) 150/79  Pulse: 86 90  Resp: 19 19  Temp: 98.8 F (37.1 C) 98.6 F (37 C)  SpO2: 100% 95%    History of present illness:  62 y.o.malewith a history of sepsis due to right foot osteomyelitis s/p transtibial amputation and found to have MSSA bacteremia with splenic infarct and tricuspid valve endocarditis. He was discharged on ancef to [facility] but was quickly brought back in due to oliguria and acute renal failure.  He developed acute respiratory failure and transferred to ICU on CCM service. Antibiotics changed to daptomycin per ID and nephrology oversaw.  Hemodialysis through right IJ temporary catheter on 10/12 and 10/14.   With improvement in respiratory status he was transferred to the medical floor. Nephrology followed closely for signs of renal recovery.  Temporary HD catheter removed 10/21. On 10/22 the patient developed acute abdominal pain worse on the right with nausea, vomiting, and inability to take po. CT abd/pelvis demonstrated distended gallbladder with gallstones concerning for acute cholecystitis. Also showed anasarca, ascites, and pleural effusions, progressive T12-L1 changes noticed on previous study, and stable splenic lesions.  Hospital course complicated by suspected biliary leak which was confirmed on ERCP done on 03/06/2018, AKI which quickly resolved post IV fluid administration.  Also complicated by early morning hypoglycemia.  His long-acting insulin dose decreased with resolution of hypoglycemia events.   03/10/2018: POD #10 status post laparoscopic cholecystectomy.  Patient seen and examined at his bedside.  No acute events overnight.  INR still subtherapeutic and will continue to be monitored at SNF.  Patient has no new complaints.  On the day of discharge, the patient was hemodynamically stable.  He will need to follow-up with his PCP, general surgery, and GI post hospitalization.  He will also need to continue physical therapy and monitor INR.    Hospital Course:  Principal Problem:   Acute renal failure (ARF) (HCC) Active Problems:   Insulin-requiring or dependent type II diabetes mellitus ([place])   Essential hypertension   CAD (coronary artery disease)   Leukocytosis   Thrombocytopenia (HCC)   S/P BKA (below knee amputation) unilateral, right (HCC)   Endocarditis of tricuspid valve  Hyponatremia   PAF (paroxysmal atrial fibrillation) (HCC)   Aortic atherosclerosis  (HCC)   Acute hypoxemic respiratory failure (HCC)   AKI (acute kidney injury) ([place])   Acute gangrenous cholecystitis s/p lap cholecystectomy 02/28/2018   Bile leak, postoperative  Resolved AKI on CKD 3 Suspect prerenal from dehydration versus others Creatinine back to baseline 1.16 from 1.39 from 1.54 from 1.62  Continue to avoid nephrotoxic agents and dehydration  Acute gangrenous cholecystitis POD #10 status post laparoscopic cholecystectomy and placement of 19 F. Blake drain with concern for a leak ERCP completed on 03/06/2018 by GI which confirmed the biliary leak post stent placement  Biliary leak post ERCP with biliary stent placement Appears to be stable at this time Will follow-up with GI outpatient in 6 to 8 weeks. Continue Coumadin Continue to monitor output from surgical drain  MSSA bacteremia and tricuspid valve endocarditis complicated by splenic infarcts with sepsis Sirs criteria has resolved ID recommendations to complete daptomycin for 6 weeks treatment through 03/08/2018  Obtain CPK weekly  Following completion of daptomycin, ID recommends starting doxycycline 100 mg p.o. twice daily on a full stomach Continue to monitor fever curve and WBC Obtain CBC in the morning  P.Afib on coumadin Continue Coumadin INR is currently sub-therapeutic 1.47 Pharmacy managing Coumadin  Subtherapeutic INR Goal between 2 and 3 Continue Coumadin Repeat INR on 03/11/2018  Recurrent hypoglycemia resolved after decreasing insulin dose Decrease Lantus dose from 22 units to 15 units nightly Continue NovoLog 4 units before meals x3 times a day Continue insulin sliding scale Last A1c was 8.5 on 01/16/2018  T12-L1 discitis/osteomyelitis Continue doxycycline after completion of daptomycin  Severe PVD status post bilateral transtibial amputations Plan for prosthetic fitting outpatient with Dr. Sharol Given Fall precaution Continue to work with physical therapy  Type 2 diabetes  complicated by hyperglycemia Last A1c 8.5  Management as stated above   Code Status: Full code  Consultants:  General surgery  GI  Infectious disease  Pharmacy  Procedures:  Temporary HD catheter  Cholecystectomy 02/28/2018  ERCP on 03/06/2018  Antimicrobials:  Daptomycin >>> doxycycline  DVT prophylaxis: Coumadin    Discharge Exam: BP (!) 150/79 (BP Location: Left Arm)   Pulse 90   Temp 98.6 F (37 C) (Oral)   Resp 19   Ht _0  (1.778 m)   Wt 105 kg   SpO2 95%   BMI 33.21 kg/m  . General: 62 y.o. year-old male pleasant well developed well nourished in no acute distress.  Alert and oriented x3. . Cardiovascular: Regular rate and rhythm with no rubs or gallops.  No thyromegaly or JVD noted.   Marland Kitchen Respiratory: Clear to auscultation with no wheezes or rales. Good inspiratory effort. . Abdomen: Soft nontender nondistended with normal bowel sounds x4 quadrants.  Surgical drain in place with moderate amount of clear yellow fluid present. . Musculoskeletal: Bilateral tibial amputee . Psychiatry: Mood is appropriate for condition and setting  Discharge Instructions You were cared for by a hospitalist during your hospital stay. If you have any questions about your discharge medications or the care you received while you were in the hospital after you are discharged, you can call the unit and asked to speak with the hospitalist on call if the hospitalist that took care of you is not available. Once you are discharged, your primary care physician will handle any further medical issues. Please note that NO REFILLS for any discharge medications will be authorized once you are discharged, as it is imperative that  you return to your primary care physician (or establish a relationship with a primary care physician if you do not have one) for your aftercare needs so that they can reassess your need for medications and monitor your lab values.  Discharge Instructions     Diet Carb Modified   Complete by:  As directed    Home infusion instructions [facility] May follow [place] Dosing Protocol; May administer Cathflo as needed to maintain patency of vascular access device.; Flushing of vascular access device: per [facility] Protocol: 0.9% NaCl pre/post medica...   Complete by:  As directed    Instructions:  May follow [place] Dosing Protocol   Instructions:  May administer Cathflo as needed to maintain patency of vascular access device.   Instructions:  Flushing of vascular access device: per [facility] Protocol: 0.9% NaCl pre/post medication administration and prn patency; Heparin 100 u/ml, 858m for implanted ports and Heparin 10u/ml, 540mfor all other central venous catheters.   Instructions:  May follow [facility] Anaphylaxis Protocol for First Dose Administration in the home: 0.9% NaCl at 25-50 ml/hr to maintain IV access for protocol meds. Epinephrine 0.3 ml IV/IM PRN and Benadryl 25-50 IV/IM PRN s/s of anaphylaxis.   Instructions:  AdCoaltonnfusion Coordinator (RN) to assist per patient IV care needs in the home PRN.   Home infusion instructions [facility] May follow [place] Protocol; May administer Cathflo as needed to maintain patency of vascular access device.; Flushing of vascular access device: per [facility]: 0.9% NaCl pre/post medica...   Complete by:  As directed    Instructions:  May follow [place]osing Protocol   Instructions:  May administer Cathflo as needed to maintain patency of vascular access device.   Instructions:  Flushing of vascular access device: per [facility]: 0.9% NaCl pre/post medication administration and prn patency; Heparin 100 u/ml, 58m458mor implanted ports and Heparin 10u/ml, 58ml86mr all other central venous catheters.   Instructions:  May follow [facility] Anaphylaxis Protocol for First Dose Administration in the home: 0.9% NaCl at 25-50 ml/hr to maintain IV access for protocol meds. Epinephrine 0.3 ml  IV/IM PRN and Benadryl 25-50 IV/IM PRN s/s of anaphylaxis.   Instructions:  AdvaLincolnusion Coordinator (RN) to assist per patient IV care needs in the home PRN.   Increase activity slowly   Complete by:  As directed      Allergies as of 03/10/2018      Reactions   Ace Inhibitors Cough   Cefazolin    Possible cause of AKI, AIN      Medication List    STOP taking these medications   ceFAZolin  IVPB Commonly known as:  ANCEF   glipiZIDE 10 MG 24 hr tablet Commonly known as:  GLUCOTROL XL   losartan 50 MG tablet Commonly known as:  COZAAR   naproxen sodium 220 MG tablet Commonly known as:  ALEVE   oxyCODONE 5 MG immediate release tablet Commonly known as:  Oxy IR/ROXICODONE   TOUJEO SOLOSTAR 300 UNIT/ML Sopn Generic drug:  Insulin Glargine Replaced by:  insulin glargine 100 UNIT/ML injection   TRULICITY 1.5 MG/0DQ/2.2WLn Generic drug:  Dulaglutide     TAKE these medications   aspirin 325 MG tablet Take 325 mg by mouth daily.   bisacodyl 10 MG suppository Commonly known as:  DULCOLAX Place 10 mg rectally once as needed (for constipation not relieved by Milk of Magnesia).   CALCIUM CITRATE + D3  250-200 MG-UNIT Tabs Generic drug:  Calcium Citrate-Vitamin D Take 1 tablet by mouth 2 (two) times daily.   cetirizine 10 MG tablet Commonly known as:  ZYRTEC Take 10 mg by mouth daily.   daptomycin  IVPB Commonly known as:  CUBICIN Inject 850 mg into the vein daily. Indication:  Endocarditis/Osteo Last Day of Therapy:  03/12/18 Labs - Once weekly:  CBC/D, BMP, and CPK Labs - Every other week:  ESR and CRP   DAPTOmycin 850 mg in sodium chloride 0.9 % 100 mL Inject 850 mg into the vein daily at 8 pm for 2 days.   doxycycline 100 MG tablet Commonly known as:  VIBRA-TABS Take 1 tablet (100 mg total) by mouth 2 (two) times daily for 28 days. Start taking on:  03/13/2018   doxycycline 100 MG tablet Commonly known as:  VIBRA-TABS Take 1 tablet (100 mg  total) by mouth every 12 (twelve) hours for 28 days. Start taking on:  03/13/2018   insulin aspart 100 UNIT/ML injection Commonly known as:  novoLOG Inject 4 Units into the skin 3 (three) times daily with meals. What changed:    how much to take  how to take this  when to take this  additional instructions   insulin aspart 100 UNIT/ML injection Commonly known as:  novoLOG Inject 0-5 Units into the skin 3 (three) times daily with meals. What changed:  You were already taking a medication with the same name, and this prescription was added. Make sure you understand how and when to take each.   insulin glargine 100 UNIT/ML injection Commonly known as:  LANTUS Inject 0.1 mLs (10 Units total) into the skin at bedtime. Replaces:  TOUJEO SOLOSTAR 300 UNIT/ML Sopn   ketoconazole 2 % cream Commonly known as:  NIZORAL Apply 1 application topically See admin instructions. Apply to both buttocks and "GL fold" rash two times a day   latanoprost 0.005 % ophthalmic solution Commonly known as:  XALATAN Place 1 drop into both eyes at bedtime.   magnesium hydroxide 400 MG/5ML suspension Commonly known as:  MILK OF MAGNESIA Take 30 mLs by mouth once as needed for mild constipation.   metoprolol tartrate 50 MG tablet Commonly known as:  LOPRESSOR Take 1 tablet (50 mg total) by mouth 2 (two) times daily. What changed:  See the new instructions.   multivitamin tablet Take 1 tablet by mouth daily.   nystatin powder Commonly known as:  MYCOSTATIN/NYSTOP Apply topically 2 (two) times daily.   pantoprazole 40 MG tablet Commonly known as:  PROTONIX Take 40 mg by mouth daily.   polyethylene glycol packet Commonly known as:  MIRALAX / GLYCOLAX Take 17 g by mouth daily as needed for mild constipation.   RA SALINE ENEMA 19-7 GM/118ML Enem Place 1 enema rectally once as needed (for constipation not relieved by Dulcolax suppository and notify MD if no relief from enema).   saccharomyces  boulardii 250 MG capsule Commonly known as:  FLORASTOR Take 1 capsule (250 mg total) by mouth 2 (two) times daily for 25 days.   simvastatin 20 MG tablet Commonly known as:  ZOCOR Take 20 mg by mouth daily at 6 PM.   warfarin 2 MG tablet Commonly known as:  COUMADIN Take 1 tablet (2 mg total) by mouth daily at 6 PM.            Home Infusion Instuctions  (From admission, onward)         Start     Ordered  03/10/18 0000  Home infusion instructions [facility] May follow [place] Dosing Protocol; May administer Cathflo as needed to maintain patency of vascular access device.; Flushing of vascular access device: per [facility] Protocol: 0.9% NaCl pre/post medica...    Question Answer Comment  Instructions May follow [place] Dosing Protocol   Instructions May administer Cathflo as needed to maintain patency of vascular access device.   Instructions Flushing of vascular access device: per [facility] Protocol: 0.9% NaCl pre/post medication administration and prn patency; Heparin 100 u/ml, 26m for implanted ports and Heparin 10u/ml, 574mfor all other central venous catheters.   Instructions May follow [facility] Anaphylaxis Protocol for First Dose Administration in the home: 0.9% NaCl at 25-50 ml/hr to maintain IV access for protocol meds. Epinephrine 0.3 ml IV/IM PRN and Benadryl 25-50 IV/IM PRN s/s of anaphylaxis.   Instructions [facility] Infusion Coordinator (RN) to assist per patient IV care needs in the home PRN.      03/10/18 1115   02/24/18 0000  Home infusion instructions [facility] May follow [place]osing Protocol; May administer Cathflo as needed to maintain patency of vascular access device.; Flushing of vascular access device: per [facility]: 0.9% NaCl pre/post medica...    Question Answer Comment  Instructions May follow [place]osing Protocol   Instructions May administer Cathflo as needed to maintain patency of vascular access device.     Instructions Flushing of vascular access device: per [facility]: 0.9% NaCl pre/post medication administration and prn patency; Heparin 100 u/ml, 49m64mor implanted ports and Heparin 10u/ml, 49ml2mr all other central venous catheters.   Instructions May follow [facility] Anaphylaxis Protocol for First Dose Administration in the home: 0.9% NaCl at 25-50 ml/hr to maintain IV access for protocol meds. Epinephrine 0.3 ml IV/IM PRN and Benadryl 25-50 IV/IM PRN s/s of anaphylaxis.   Instructions [facility] Infusion Coordinator (RN) to assist per patient IV care needs in the home PRN.      02/24/18 1141         Allergies  Allergen Reactions  . Ace Inhibitors Cough  . Cefazolin     Possible cause of AKI, AIN    Contact information for follow-up providers    DudaNewt Minion Follow up in 1 week(s).   Specialty:  Orthopedic Surgery Contact information: [phone]        [facility]. Schedule an appointment as soon as possible for a visit in 2 week(s).   Specialties:  GeneOptician, dispensingergency Medicine Contact information: [phone]        [facility]             . Schedule an appointment as soon as possible for a visit.   Why:  Dr. SnidBaxter Flattery14 @ 3:00pm. If you are not able to make this time please call the office to reschedule.  Contact information: [address] [place] [phone]   BlacCoralie Keens Follow up on 03/21/2018.   Specialty:  General Surgery Why:  11:00am, arrive by 10:30am for paperwork and checkin.  If patient is coming from a facility, someone from that facility must stay with patient for the duration of his visit.  If not, the appointment may be cancelled and rescheduled. Contact information: [address] [phone]  Yetta Flock, MD  Follow up on 04/08/2018.   Specialty:  Gastroenterology Why:  2:30 PM follow up of bile leak and at office visit arrangements will be made for future endoscopic procedure to remove stent placed in bile duct.  Contact information: [address] [place] [address] [phone]            Contact information for after-discharge care    Destination    [facility] .   Service:  Skilled Nursing Contact information: [address] [place] [phone]                   The results of significant diagnostics from this hospitalization (including imaging, microbiology, ancillary and laboratory) are listed below for reference.    Significant Diagnostic Studies: Ct Abdomen Pelvis Wo Contrast  Result Date: 02/25/2018 CLINICAL DATA:  Pt is septic with nausea, vomiting, abdomen pain. eval for gastroenteritis and colitis EXAM: CT ABDOMEN AND PELVIS WITHOUT CONTRAST TECHNIQUE: Multidetector CT imaging of the abdomen and pelvis was performed following the standard protocol without IV contrast. COMPARISON:  01/29/2018, 01/14/2018 FINDINGS: Lower chest: There are increased bilateral pleural effusions, associated with bibasilar atelectasis. There is dense atherosclerotic calcification of coronary arteries. Hepatobiliary: Liver is diffusely low attenuation. No focal liver lesions are identified. There is periportal edema. Gallbladder is distended compare with prior studies. Layering, depending calcified gallstones are present. Pancreas: Unremarkable. No pancreatic ductal dilatation or surrounding inflammatory changes. Spleen: Low-attenuation lesion within the posterior aspect of the spleen has been better demonstrated on previous contrast-enhanced exams. The spleen is UPPER limits normal in size. Adrenals/Urinary Tract: The adrenal glands are normal in appearance. No intrarenal calculi and or ureteral obstruction. No renal mass. There is air within the  urinary bladder raising the question of infectious process or recent catheterization. Stomach/Bowel: The stomach and small bowel loops are normal in appearance. The appendix is well seen and has a normal appearance. Loops of colon are normal in caliber and wall thickness. No acute diverticulitis or wall thickening. Vascular/Lymphatic: There is atherosclerotic calcification of the abdominal aorta and its branches. No aneurysm. No retroperitoneal or mesenteric adenopathy. Reproductive: Prostate is unremarkable. Densely calcified vas deferens. Other: Small amount of ascites in the pelvis, perihepatic region, and paracolic gutters. There is diffuse body wall edema and mesenteric edema. Musculoskeletal: There is persistent sclerosis, irregularity, and destruction of the RIGHT aspect of T12 and L1, suspicious for osteomyelitis/discitis. There is further destruction since the study of 01/14/2018. Degenerative changes are noted at L4-5. IMPRESSION: 1. Increased bilateral pleural effusions, anasarca, and ascites. 2. Distended gallbladder and gallstones raising the question of acute cholecystitis. 3. Air within the urinary bladder suspicious for infection or recent catheterization. 4.  Aortic atherosclerosis.  (ICD10-I70.0) 5. Changes at T12-L1 suspicious for discitis/osteomyelitis, progressed since study performed September 10th. Consider further evaluation with MRI. 6. No evidence for colitis or gastroenteritis. No urinary tract obstruction. 7. Hepatic steatosis. 8. Stable splenic lesion, possibly representing infarct. Electronically Signed   By: Nolon Nations M.D.   On: 02/25/2018 14:54   Nm Hepatobiliary Liver Func  Result Date: 03/03/2018 CLINICAL DATA:  Status post cholecystectomy. Bile output from surgical drain. Evaluate for postop bile leak. EXAM: NUCLEAR MEDICINE HEPATOBILIARY IMAGING TECHNIQUE: Sequential images of the abdomen were obtained out to 60 minutes following intravenous administration of  radiopharmaceutical. RADIOPHARMACEUTICALS:  5.2 mCi Tc-59m Choletec IV COMPARISON:  02/26/2018 FINDINGS: Prompt uptake and biliary excretion of activity by the liver is seen.  Biliary activity passes into small bowel, consistent with patent common bile duct. No gallbladder activity is seen, consistent with prior cholecystectomy. There is no evidence of leak or extravasation of biliary activity IMPRESSION: Normal study status post cholecystectomy. No evidence of postop bile leak or biliary obstruction. Electronically Signed   By: Earle Gell M.D.   On: 03/03/2018 17:52   Nm Hepatobiliary Liver Func  Result Date: 02/26/2018 CLINICAL DATA:  Abdominal pain. Abnormal gallbladder on a recent CT. EXAM: NUCLEAR MEDICINE HEPATOBILIARY IMAGING TECHNIQUE: Sequential images of the abdomen were obtained out to 60 minutes following intravenous administration of radiopharmaceutical. RADIOPHARMACEUTICALS:  5.15 mCi Tc-14m Choletec IV COMPARISON:  CT, 02/25/2018 FINDINGS: There is prompt radiotracer accumulation by the liver. Prompt excretion is seen into the intra and extrahepatic biliary tree with small bowel activity seen early during the exam. No gallbladder activity was seen after 1 hour of imaging following radiotracer injection. The patient was then injected with 3 mg of morphine to induce sphincter of Odi spasm followed by additional imaging. No gallbladder activity was documented on the follow-up imaging. IMPRESSION: 1. Nonvisualization of the gallbladder consistent with an obstructed cystic duct and acute cholecystitis. Electronically Signed   By: DLajean ManesM.D.   On: 02/26/2018 13:19   Ir Fluoro Guide Cv Line Right  Result Date: 02/13/2018 INDICATION: 62year old male with end-stage renal disease on hemodialysis. He currently has a right IJ approach non tunneled hemodialysis catheter and requires a tunneled hemodialysis catheter. EXAM: TUNNELED CENTRAL VENOUS HEMODIALYSIS CATHETER PLACEMENT WITH ULTRASOUND  AND FLUOROSCOPIC GUIDANCE MEDICATIONS: 2 g Ancef. The antibiotic was given in an appropriate time interval prior to skin puncture. ANESTHESIA/SEDATION: Moderate (conscious) sedation was employed during this procedure. A total of Versed 1.5 mg and Fentanyl 75 mcg was administered intravenously. Moderate Sedation Time: 17 minutes. The patient's level of consciousness and vital signs were monitored continuously by radiology nursing throughout the procedure under my direct supervision. FLUOROSCOPY TIME:  Fluoroscopy Time: 0 minutes 30 seconds (6 mGy). COMPLICATIONS: None immediate. PROCEDURE: Informed written consent was obtained from the patient after a discussion of the risks, benefits, and alternatives to treatment. Questions regarding the procedure were encouraged and answered. The right neck and chest were prepped with chlorhexidine in a sterile fashion, and a sterile drape was applied covering the operative field. Maximum barrier sterile technique with sterile gowns and gloves were used for the procedure. A timeout was performed prior to the initiation of the procedure. After creating a small venotomy incision, a micropuncture kit was utilized to access the right internal jugular vein under direct, real-time ultrasound guidance after the overlying soft tissues were anesthetized with 1% lidocaine with epinephrine. Ultrasound image documentation was performed. The microwire was kinked to measure appropriate catheter length. A stiff Glidewire was advanced to the level of the IVC and the micropuncture sheath was exchanged for a peel-away sheath. A Palindrome tunneled hemodialysis catheter measuring 19 cm from tip to cuff was tunneled in a retrograde fashion from the anterior chest wall to the venotomy incision. The catheter was then placed through the peel-away sheath with tips ultimately positioned within the superior aspect of the right atrium. Final catheter positioning was confirmed and documented with a spot  radiographic image. The catheter aspirates and flushes normally. The catheter was flushed with appropriate volume heparin dwells. The catheter exit site was secured with a 0-Prolene retention suture. The venotomy incision was closed with an interrupted 4-0 Vicryl, Dermabond and Steri-strips. Dressings were applied. The patient tolerated the procedure  well without immediate post procedural complication. IMPRESSION: Successful placement of 19 cm tip to cuff tunneled hemodialysis catheter via the right internal jugular vein with tips terminating within the superior aspect of the right atrium. The catheter is ready for immediate use. Electronically Signed   By: Jacqulynn Cadet M.D.   On: 02/13/2018 16:17   Ir Removal Tun Cv Cath W/o Fl  Result Date: 02/24/2018 INDICATION: 62 year old with recovering acute kidney injury. Dialysis catheter is no longer needed. EXAM: REMOVAL TUNNELED CENTRAL VENOUS CATHETER MEDICATIONS: None ANESTHESIA/SEDATION: None FLUOROSCOPY TIME:  None COMPLICATIONS: None immediate. PROCEDURE: Informed written consent was obtained from the patient after a thorough discussion of the procedural risks, benefits and alternatives. All questions were addressed. Heparin was removed from both lumens. The retention suture was removed. Catheter was easily removed with manual traction. Small amount of bleeding at the catheter exit site. Bandage placed at the catheter exit site. IMPRESSION: Successful catheter removal as described above. Electronically Signed   By: Markus Daft M.D.   On: 02/24/2018 12:47   Ir US Guide Vasc Access Right  Result Date: 02/13/2018 INDICATION: 62 year old male with end-stage renal disease on hemodialysis. He currently has a right IJ approach non tunneled hemodialysis catheter and requires a tunneled hemodialysis catheter. EXAM: TUNNELED CENTRAL VENOUS HEMODIALYSIS CATHETER PLACEMENT WITH ULTRASOUND AND FLUOROSCOPIC GUIDANCE MEDICATIONS: 2 g Ancef. The antibiotic was given  in an appropriate time interval prior to skin puncture. ANESTHESIA/SEDATION: Moderate (conscious) sedation was employed during this procedure. A total of Versed 1.5 mg and Fentanyl 75 mcg was administered intravenously. Moderate Sedation Time: 17 minutes. The patient's level of consciousness and vital signs were monitored continuously by radiology nursing throughout the procedure under my direct supervision. FLUOROSCOPY TIME:  Fluoroscopy Time: 0 minutes 30 seconds (6 mGy). COMPLICATIONS: None immediate. PROCEDURE: Informed written consent was obtained from the patient after a discussion of the risks, benefits, and alternatives to treatment. Questions regarding the procedure were encouraged and answered. The right neck and chest were prepped with chlorhexidine in a sterile fashion, and a sterile drape was applied covering the operative field. Maximum barrier sterile technique with sterile gowns and gloves were used for the procedure. A timeout was performed prior to the initiation of the procedure. After creating a small venotomy incision, a micropuncture kit was utilized to access the right internal jugular vein under direct, real-time ultrasound guidance after the overlying soft tissues were anesthetized with 1% lidocaine with epinephrine. Ultrasound image documentation was performed. The microwire was kinked to measure appropriate catheter length. A stiff Glidewire was advanced to the level of the IVC and the micropuncture sheath was exchanged for a peel-away sheath. A Palindrome tunneled hemodialysis catheter measuring 19 cm from tip to cuff was tunneled in a retrograde fashion from the anterior chest wall to the venotomy incision. The catheter was then placed through the peel-away sheath with tips ultimately positioned within the superior aspect of the right atrium. Final catheter positioning was confirmed and documented with a spot radiographic image. The catheter aspirates and flushes normally. The catheter  was flushed with appropriate volume heparin dwells. The catheter exit site was secured with a 0-Prolene retention suture. The venotomy incision was closed with an interrupted 4-0 Vicryl, Dermabond and Steri-strips. Dressings were applied. The patient tolerated the procedure well without immediate post procedural complication. IMPRESSION: Successful placement of 19 cm tip to cuff tunneled hemodialysis catheter via the right internal jugular vein with tips terminating within the superior aspect of the right atrium. The catheter is  ready for immediate use. Electronically Signed   By: Jacqulynn Cadet M.D.   On: 02/13/2018 16:17   Dg Chest Port 1 View  Result Date: 02/10/2018 CLINICAL DATA:  Short of breath. EXAM: PORTABLE CHEST 1 VIEW COMPARISON:  02/10/2018 at 5:34 a.m. FINDINGS: Hazy central and lower lung airspace opacities have mildly improved. Interstitial thickening has improved. Small pleural effusions appear decreased since the prior exam. Right internal jugular central venous line and right PICC are stable. There stable changes from cardiac surgery. IMPRESSION: 1. Interval improvement in congestive heart failure and pulmonary edema. There is still mild residual airspace edema and small effusions. No new abnormalities. Electronically Signed   By: Lajean Manes M.D.   On: 02/10/2018 20:59   Dg Chest Port 1 View  Result Date: 02/10/2018 CLINICAL DATA:  Shortness of breath. EXAM: PORTABLE CHEST 1 VIEW COMPARISON:  01/31/2018 FINDINGS: Right dialysis catheter tip in the mid SVC. Right upper extremity PICC tip in the mid distal SVC. Element of bilateral perihilar opacities and Kerley B-lines consistent with pulmonary edema. Increased bilateral pleural effusions. Cardiomegaly is unchanged. Prosthetic aortic valve. No pneumothorax. IMPRESSION: CHF with moderate pulmonary edema and pleural effusions. Cardiomegaly appears similar. Electronically Signed   By: Keith Rake M.D.   On: 02/10/2018 05:50   Dg  Ercp Biliary & Pancreatic Ducts  Result Date: 03/06/2018 CLINICAL DATA:  62 year old male with a history of postoperative bile leak. EXAM: ERCP TECHNIQUE: Multiple spot images obtained with the fluoroscopic device and submitted for interpretation post-procedure. FLUOROSCOPY TIME:  Fluoroscopy Time:  8 minutes 3 seconds reported COMPARISON:  None. FINDINGS: A total of 9 intraoperative saved images are submitted for review. The images demonstrate a flexible endoscope in the descending duodenum. Initially, the main pancreatic duct is catheterized and a pancreatic ductogram performed. Pancreatic duct appears normal. Subsequently, the common bile duct is catheterized and a cholangiogram was performed. A balloon occluded cholangiogram demonstrates extravasation of contrast from the stump of the cystic duct. No evidence of biliary obstruction, stenosis or stricture. On the final image, a plastic biliary stent has been placed to facilitate decompression. IMPRESSION: 1. Positive for bile leak emanating from the cystic duct stump. 2. Successful placement of a plastic biliary stent. These images were submitted for radiologic interpretation only. Please see the procedural report for the amount of contrast and the fluoroscopy time utilized. Electronically Signed   By: Jacqulynn Cadet M.D.   On: 03/06/2018 16:53   Dg Abd Portable 1v  Result Date: 02/11/2018 CLINICAL DATA:  RIGHT LOWER QUADRANT abdominal pain and tenderness. EXAM: PORTABLE ABDOMEN - 1 VIEW COMPARISON:  02/03/2018, 01/28/2018. CT abdomen and pelvis 01/29/2018. FINDINGS: Bowel gas pattern unremarkable without evidence of obstruction or significant ileus. No acute abdominal abnormality. Stool burden in the colon. No visible opaque urinary tract calculi. Degenerative changes involving the visualized thoracic and lumbar spine. IMPRESSION: Negative. Electronically Signed   By: Evangeline Dakin M.D.   On: 02/11/2018 13:40    Microbiology: Recent Results  (from the past 240 hour(s))  Culture, Urine     Status: None   Collection Time: 02/28/18 10:34 PM  Result Value Ref Range Status   Specimen Description URINE, RANDOM  Final   Special Requests NONE  Final   Culture   Final    NO GROWTH Performed at [facility], [address], [place], [place] [address]    Report Status 03/02/2018 FINAL  Final     Labs: Basic Metabolic Panel: Recent Labs  Lab 03/07/18  9179 03/08/18 0521 03/08/18 1041 03/09/18 0313 03/10/18 0333  NA 140 138 138 139 138  K 4.2 3.3* 3.5 3.5 3.5  CL 116* 117* 117* 115* 117*  CO2 18* 18* 18* 18* 18*  GLUCOSE 163* 72 175* 89 151*  BUN 29* 29* 28* 25* 18  CREATININE 1.49* 1.62* 1.54* 1.39* 1.14  CALCIUM 7.8* 7.5* 7.4* 7.5* 7.2*  MG  --   --  1.5*  --   --   PHOS 4.3 3.0 2.8 2.6 2.6   Liver Function Tests: Recent Labs  Lab 03/07/18 0342 03/08/18 0521 03/08/18 1041 03/09/18 0313 03/10/18 0333  AST  --   --  44*  --   --   ALT  --   --  14  --   --   ALKPHOS  --   --  85  --   --   BILITOT  --   --  0.3  --   --   PROT  --   --  4.8*  --   --   ALBUMIN 2.0* 1.8* 1.8* 1.7* 1.7*   No results for input(s): LIPASE, AMYLASE in the last 168 hours. No results for input(s): AMMONIA in the last 168 hours. CBC: Recent Labs  Lab 03/05/18 0402 03/06/18 0605 03/08/18 1041 03/10/18 0652  WBC 13.4* 12.1* 13.6* 12.7*  NEUTROABS  --  10.0* 11.7* 10.7*  HGB 9.8* 10.7* 9.1* 9.6*  HCT 32.9* 36.1* 31.9* 32.9*  MCV 92.4 91.4 94.1 92.7  PLT 169 188 159 147*   Cardiac Enzymes: No results for input(s): CKTOTAL, CKMB, CKMBINDEX, TROPONINI in the last 168 hours. BNP: BNP (last 3 results) No results for input(s): BNP in the last 8760 hours.  ProBNP (last 3 results) No results for input(s): PROBNP in the last 8760 hours.  CBG: Recent Labs  Lab 03/09/18 1115 03/09/18 1635 03/09/18 2118 03/10/18 0740 03/10/18 1121  GLUCAP 98 174* 117* 105* 178*       Signed:  Kayleen Memos, MD [facility] 03/10/2018, 4:47 PM

## 2018-03-10 NOTE — Progress Notes (Signed)
ANTICOAGULATION CONSULT NOTE - Follow Up Consult  Pharmacy Consult for Coumadin  Indication: atrial fibrillation   Allergies  Allergen Reactions  . Ace Inhibitors Cough  . Cefazolin     Possible cause of AKI, AIN    Patient Measurements: Height: 5\' 10"  (177.8 cm) Weight: 231 lb 7.7 oz (105 kg) IBW/kg (Calculated) : 73  Vital Signs: Temp: 98.8 F (37.1 C) (11/04 1021) Temp Source: Oral (11/04 1021) BP: 151/78 (11/04 1021) Pulse Rate: 86 (11/04 1021)  Labs: Recent Labs    03/08/18 0521 03/08/18 1041 03/09/18 0313 03/10/18 0333 03/10/18 0652  HGB  --  9.1*  --   --  9.6*  HCT  --  31.9*  --   --  32.9*  PLT  --  159  --   --  147*  LABPROT 16.1*  --  18.1* 17.6*  --   INR 1.30  --  1.52 1.47  --   CREATININE 1.62* 1.54* 1.39* 1.14  --     Estimated Creatinine Clearance: 82.6 mL/min (by C-G formula based on SCr of 1.14 mg/dL).  Assessment:  62 yo M with new onset PAF on Coumadin. Coumadin started on 10/1, achieved therapeutic INR, then held and reversed 10/7 for [facility] placement, 10/24 for lap chole, and 10/30 for ERCP. Warfarin dosing resumed on 10/31 post-op. Pharmacy consulted for warfarin dosing.   INR today remains SUBtherapeutic and has trended down slightly (INR 1.47 << 1.52, goal of 2-3). CBC low but stable - no bleeding noted. Po intake 75-100%.   Goal of Therapy:  INR 2-3 Monitor platelets by anticoagulation protocol: Yes   Plan:  - Warfarin 4 mg x 1 dose at 1800 today - F/u daily PT/INR - Will continue to monitor for any signs/symptoms of bleeding and will follow up with PT/INR in the a.m.   Thank you for allowing pharmacy to be a part of this patient's care.  Alycia Rossetti, PharmD, BCPS Clinical Pharmacist Pager: [phone] Clinical phone for 03/10/2018 from 7a-3:30p: [phone] If after 3:30p, please call main pharmacy at: [phone] Please check AMION for all [place] numbers 03/10/2018 10:34 AM

## 2018-03-10 NOTE — Progress Notes (Signed)
[facility] Progress Note  4 Days Post-Op  Subjective: CC-  Doing well this morning. No new complaints. Denies any current abdominal pain. Tolerating diet and having bowel function.  Objective: Vital signs in last 24 hours: Temp:  [97.9 F (36.6 C)-98.3 F (36.8 C)] 98.3 F (36.8 C) (11/03 2012) Pulse Rate:  [85-88] 85 (11/03 2012) Resp:  [18-20] 20 (11/03 2012) BP: (135-158)/(67-78) 135/67 (11/03 2012) SpO2:  [99 %-100 %] 99 % (11/03 2012) Last BM Date: 03/09/18  Intake/Output from previous day: 11/03 0701 - 11/04 0700 In: 1808.6 [P.O.:1400; I.V.:291.6; IV Piggyback:117] Out: 1157 [Urine:2775; Drains:270] Intake/Output this shift: No intake/output data recorded.  PE: Gen:  Alert, NAD Pulm:  effort normal Abd: Soft, NT/ND, +BS, lap incision cdi, JP Drain with serous output Skin: no rashes noted, warm and dry  Lab Results:  Recent Labs    03/08/18 1041 03/10/18 0652  WBC 13.6* 12.7*  HGB 9.1* 9.6*  HCT 31.9* 32.9*  PLT 159 147*   BMET Recent Labs    03/09/18 0313 03/10/18 0333  NA 139 138  K 3.5 3.5  CL 115* 117*  CO2 18* 18*  GLUCOSE 89 151*  BUN 25* 18  CREATININE 1.39* 1.14  CALCIUM 7.5* 7.2*   PT/INR Recent Labs    03/09/18 0313 03/10/18 0333  LABPROT 18.1* 17.6*  INR 1.52 1.47   CMP     Component Value Date/Time   NA 138 03/10/2018 0333   K 3.5 03/10/2018 0333   CL 117 (H) 03/10/2018 0333   CO2 18 (L) 03/10/2018 0333   GLUCOSE 151 (H) 03/10/2018 0333   BUN 18 03/10/2018 0333   CREATININE 1.14 03/10/2018 0333   CALCIUM 7.2 (L) 03/10/2018 0333   PROT 4.8 (L) 03/08/2018 1041   ALBUMIN 1.7 (L) 03/10/2018 0333   AST 44 (H) 03/08/2018 1041   ALT 14 03/08/2018 1041   ALKPHOS 85 03/08/2018 1041   BILITOT 0.3 03/08/2018 1041   GFRNONAA >60 03/10/2018 0333   GFRAA >60 03/10/2018 0333   Lipase     Component Value Date/Time   LIPASE 23 01/14/2018 1926       Studies/Results: No results  found.  Anti-infectives: Anti-infectives (From admission, onward)   Start     Dose/Rate Route Frequency Ordered Stop   03/13/18 1000  doxycycline (VIBRA-TABS) tablet 100 mg     100 mg Oral Every 12 hours 03/01/18 1217 04/10/18 0959   03/07/18 2000  DAPTOmycin (CUBICIN) 850 mg in sodium chloride 0.9 % IVPB     850 mg 234 mL/hr over 30 Minutes Intravenous Daily 03/07/18 1422 03/13/18 1959   03/06/18 1800  DAPTOmycin (CUBICIN) 385 mg in sodium chloride 0.9 % IVPB  Status:  Discontinued     8 mg/kg  48.1 kg 215.4 mL/hr over 30 Minutes Intravenous Every 48 hours 03/01/18 1213 03/02/18 1158   03/05/18 2000  DAPTOmycin (CUBICIN) 385 mg in sodium chloride 0.9 % IVPB  Status:  Discontinued     8 mg/kg  48.1 kg 215.4 mL/hr over 30 Minutes Intravenous Every 48 hours 03/02/18 1158 03/03/18 1322   03/05/18 2000  DAPTOmycin (CUBICIN) 850 mg in sodium chloride 0.9 % IVPB  Status:  Discontinued     850 mg 234 mL/hr over 30 Minutes Intravenous Daily 03/03/18 1322 03/04/18 1401   03/05/18 1800  DAPTOmycin (CUBICIN) 385 mg in sodium chloride 0.9 % IVPB  Status:  Discontinued     8 mg/kg  48.1 kg 215.4 mL/hr over 30 Minutes Intravenous  Every 48 hours 03/01/18 1200 03/01/18 1213   03/04/18 1600  piperacillin-tazobactam (ZOSYN) IVPB 3.375 g     3.375 g 12.5 mL/hr over 240 Minutes Intravenous Every 8 hours 03/04/18 1401 03/07/18 1910   03/03/18 1600  piperacillin-tazobactam (ZOSYN) IVPB 3.375 g  Status:  Discontinued     3.375 g 12.5 mL/hr over 240 Minutes Intravenous Every 8 hours 03/03/18 1230 03/04/18 1401   02/28/18 1530  piperacillin-tazobactam (ZOSYN) IVPB 3.375 g  Status:  Discontinued     3.375 g 12.5 mL/hr over 240 Minutes Intravenous Every 12 hours 02/28/18 1518 03/03/18 1230   02/26/18 2000  cefTRIAXone (ROCEPHIN) 2 g in sodium chloride 0.9 % 100 mL IVPB  Status:  Discontinued     2 g 200 mL/hr over 30 Minutes Intravenous Every 24 hours 02/26/18 1911 02/28/18 1500   02/26/18 2000   metroNIDAZOLE (FLAGYL) IVPB 500 mg  Status:  Discontinued     500 mg 100 mL/hr over 60 Minutes Intravenous Every 8 hours 02/26/18 1911 02/28/18 1500   02/24/18 0000  daptomycin (CUBICIN) IVPB  Status:  Discontinued     700 mg Intravenous Every 24 hours 02/24/18 1141 03/03/18    02/23/18 2000  DAPTOmycin (CUBICIN) 700 mg in sodium chloride 0.9 % IVPB  Status:  Discontinued     700 mg 228 mL/hr over 30 Minutes Intravenous Every 24 hours 02/23/18 1233 02/26/18 1911   02/13/18 1415  ceFAZolin (ANCEF) powder 2 g     2 g Other To Surgery 02/13/18 1410 02/13/18 2142   02/13/18 1347  ceFAZolin (ANCEF) 2-4 GM/100ML-% IVPB    Note to Pharmacy:  Arlean Hopping   : cabinet override      02/13/18 1347 02/13/18 1410   01/30/18 2000  DAPTOmycin (CUBICIN) 700 mg in sodium chloride 0.9 % IVPB  Status:  Discontinued     700 mg 228 mL/hr over 30 Minutes Intravenous Every 48 hours 01/30/18 1306 02/23/18 1233   01/30/18 1000  fluconazole (DIFLUCAN) tablet 50 mg     50 mg Oral Daily 01/29/18 0930 02/04/18 0914   01/29/18 2000  DAPTOmycin (CUBICIN) 700 mg in sodium chloride 0.9 % IVPB  Status:  Discontinued     700 mg 228 mL/hr over 30 Minutes Intravenous Every 48 hours 01/29/18 1013 01/30/18 1306   01/29/18 1030  fluconazole (DIFLUCAN) tablet 100 mg     100 mg Oral  Once 01/29/18 0930 01/29/18 1121   01/28/18 2200  ceFAZolin (ANCEF) IVPB 1 g/50 mL premix  Status:  Discontinued     1 g 100 mL/hr over 30 Minutes Intravenous Every 24 hours 01/28/18 1007 01/28/18 1231   01/27/18 2245  ceFAZolin (ANCEF) IVPB 2g/100 mL premix  Status:  Discontinued     2 g 200 mL/hr over 30 Minutes Intravenous Every 12 hours 01/27/18 2243 01/28/18 1007   01/27/18 2230  ceFAZolin (ANCEF) IVPB  Status:  Discontinued    Note to Pharmacy:  Indication:  MSSA bacteremia/endocarditis Last Day of Therapy:  03/08/18 Labs - Once weekly:  CBC/D and BMP, Labs - Every other week:  ESR and CRP     2 g Intravenous Every 8 hours 01/27/18  2220 01/27/18 2227   01/27/18 2230  ceFAZolin (ANCEF) IVPB 2g/100 mL premix  Status:  Discontinued     2 g 200 mL/hr over 30 Minutes Intravenous Every 8 hours 01/27/18 2227 01/27/18 2243       Assessment/Plan MSSA bacteremia Osteomyelitis of RLE, s/p BKA H/o L BKA Endocarditis ARF,  just off HD ParoxysmalA fib- oncoumadin, INR1.47 DM-II PVD H/o MI  Acute, gangrenouscholecystitis POD#10-s/p laparoscopic cholecystectomy, placement19F blakedrain, with bile leak- Dr. Ninfa Linden -s/p ERCP 10/31 which confirmed bile leak at cystic duct remnant.  Stent placed -patient is surgically stable for DC to SNF when medically stable. F/u information with Dr. Ninfa Linden on AVS -no further abx therapy is needed in regards to his gallbladder at this time.  abx per ID recommendations for his bacteremia  ID -Zosyn 10/25 >>11/1,daptomycin 9/25 -->, Doxycycline --> FEN - heart healthy/carb mod diet VTE -coumadin Foley -none Follow up: Dr. Ninfa Linden   LOS: 60 days    Wellington Hampshire , [facility] 03/10/2018, 7:55 AM Pager: [phone] Mon 7:00 am -11:30 AM Tues-Fri 7:00 am-4:30 pm Sat-Sun 7:00 am-11:30 am

## 2018-03-11 LAB — GLUCOSE, CAPILLARY
GLUCOSE-CAPILLARY: 125 mg/dL — AB (ref 70–99)
Glucose-Capillary: 145 mg/dL — ABNORMAL HIGH (ref 70–99)
Glucose-Capillary: 143 mg/dL — ABNORMAL HIGH (ref 70–99)

## 2018-03-11 LAB — RENAL FUNCTION PANEL
ALBUMIN: 1.8 g/dL — AB (ref 3.5–5.0)
Anion gap: 4 — ABNORMAL LOW (ref 5–15)
BUN: 15 mg/dL (ref 8–23)
CO2: 19 mmol/L — ABNORMAL LOW (ref 22–32)
CREATININE: 1.08 mg/dL (ref 0.61–1.24)
Calcium: 7.8 mg/dL — ABNORMAL LOW (ref 8.9–10.3)
Chloride: 117 mmol/L — ABNORMAL HIGH (ref 98–111)
GFR calc Af Amer: >60 mL/min (ref >60)
Glucose, Bld: 130 mg/dL — ABNORMAL HIGH (ref 70–99)
PHOSPHORUS: 2.3 mg/dL — AB (ref 2.5–4.6)
POTASSIUM: 3.6 mmol/L (ref 3.5–5.1)
SODIUM: 140 mmol/L (ref 135–145)

## 2018-03-11 LAB — PROTIME-INR
INR: 1.58
PROTHROMBIN TIME: 18.7 s — AB (ref 11.4–15.2)

## 2018-03-11 MED ORDER — HEPARIN SOD (PORK) LOCK FLUSH 100 UNIT/ML IV SOLN
250.0000 [IU] | INTRAVENOUS | Status: AC | PRN
  Administered 2018-03-11: 250 [IU]

## 2018-03-11 MED ORDER — WARFARIN SODIUM 4 MG PO TABS
4.0000 mg | ORAL_TABLET | Freq: Once | ORAL | Status: AC
  Administered 2018-03-11: 4 mg via ORAL
  Filled 2018-03-11: qty 1

## 2018-03-11 NOTE — Progress Notes (Signed)
Report given to Juluis Rainier of [facility].

## 2018-03-11 NOTE — Progress Notes (Signed)
Pharmacy Antibiotic Note  [name] is a 62 y.o. male continues on Zosyn since 10/25 for gangrenous cholecystitis.  Previously on Daptomycin 9/25>>10/25 for MSSA bacteremia and endocarditis, which is on hold while on Zosyn.  Prior plan to resume Daptomycin on 10/30, and to continue Zosyn until abdominal issues have resolved.  S/p ERCP and stent.. Surgery okay with change back to Daptomycin on 11/1.  Noted plans for possible discharge soon. Daptomycin 850 mg IV q24h is to continue thru 11/6, then the patient is to transition to Doxycycline on 11/7 for 28d per ID - thru 12/4.    Plan: - Continue Daptomycin 8 mg/kg (850 mg) IV q24 hrs - thru 11/6 - No further CK checks at this time - Start Doxy 100 mg bid on 11/7 thru 12/4 per ID recommendations - Will continue to follow along for now  Height: 5\' 10"  (177.8 cm) Weight: 231 lb 7.7 oz (105 kg) IBW/kg (Calculated) : 73  Temp (24hrs), Avg:98.3 F (36.8 C), Min:97.7 F (36.5 C), Max:98.8 F (37.1 C)  Recent Labs  Lab 03/05/18 0402  03/06/18 0605  03/08/18 0521 03/08/18 1041 03/09/18 0313 03/10/18 0333 03/10/18 0652 03/11/18 0329  WBC 13.4*  --  12.1*  --   --  13.6*  --   --  12.7*  --   CREATININE 1.58*   < > 1.40*   < > 1.62* 1.54* 1.39* 1.14  --  1.08   < > = values in this interval not displayed.    Estimated Creatinine Clearance: 87.2 mL/min (by C-G formula based on SCr of 1.08 mg/dL).    Allergies  Allergen Reactions  . Ace Inhibitors Cough  . Cefazolin     Possible cause of AKI, AIN   Dapto 9/25 >> 10/25, resume 11/1>>(11/6) Fluconazole 9/26 >> 10/2 Nystatin powder (groin) 9/24>> Zosyn 10/25 x 7 days originally planned, then continued d/t abd issues>stop 11/1 Doxy 11/7>>  (28d 04/09/18)  9/24 Urine - negative 10/15 GI panel - negative 10/25 UCx neg 10/25 UCx neg   Thank you for allowing pharmacy to be a part of this patient's care.  Alycia Rossetti, PharmD, BCPS Clinical Pharmacist Pager:  [phone] Clinical phone for 03/11/2018 from 7a-3:30p: [phone] If after 3:30p, please call main pharmacy at: [phone] Please check AMION for all [place] numbers 03/11/2018 8:13 AM

## 2018-03-11 NOTE — Progress Notes (Signed)
Physical Therapy Treatment Patient Details Name: [name] MRN: [id number] DOB: 01-Jan-1956 Today's Date: 03/11/2018    History of Present Illness Pt is a 62 y/o male with a complicated PMH hospitalized September for sepsis secondary to right foot osteomyelitis, underwent BKA, found to have MSSA bacteremia with splenic infarct and tricuspid valve endocarditis. He was discharged to SNF and developed acute oliguria - presented to the ED where he was found to have AKI.  Developed acute respiratory failure necessitating transfer to ICU.  Temporary dialysis catheter was placed for HD. s/p lap cholecystectomy 10/25.    PT Comments    Pt progressing towards physical therapy goals. Was able to don prosthetic this session while sitting EOB. Assist provided for posterior truncal support as well as for getting prosthetic locked in place. He was motivated to attempt lateral transfer instead of A-P transfer as he had prosthetic for support on the floor. Unfortunately, pt was not able to get the leverage he needed and the transfer required +2 mod-max assist to complete. Pt anticipates d/c to SNF today - will continue to follow.   Follow Up Recommendations  SNF;Supervision/Assistance - 24 hour     Equipment Recommendations  None recommended by PT    Recommendations for Other Services       Precautions / Restrictions Precautions Precautions: Fall Precaution Comments: JP drain Required Braces or Orthoses: Other Brace/Splint Other Brace/Splint: Shrinkers BLE's, prosthetic LLE Restrictions Weight Bearing Restrictions: No RLE Weight Bearing: Non weight bearing LLE Weight Bearing: Weight bearing as tolerated(in prosthetic)    Mobility  Bed Mobility Overal bed mobility: Needs Assistance Bed Mobility: Supine to Sit     Supine to sit: Max assist;HOB elevated     General bed mobility comments: Pt required increased assist this session. He was able to minimally scoot himself towards EOB and  required max assist to elevate trunk to full sitting position.   Transfers Overall transfer level: Needs assistance Equipment used: None Transfers: Lateral/Scoot Transfers          Lateral/Scoot Transfers: Mod assist;Max assist;+2 physical assistance;+2 safety/equipment;From elevated surface General transfer comment: Pt asking to attempt lateral scoot transfer to drop arm recliner insetad of A-P transfer since he had his prosthetic donned. PT and NT provided up to max assist at times to help scoot pt with bed pad to chair. Pt able to assist very minimally and had difficulty using L prosthetic for leverage.   Ambulation/Gait             General Gait Details: Not able at this time.    Stairs             Wheelchair Mobility    Modified Rankin (Stroke Patients Only)       Balance Overall balance assessment: Needs assistance Sitting-balance support: Bilateral upper extremity supported Sitting balance-Leahy Scale: Poor Sitting balance - Comments: Posterior support required throughout sitting activity                                    Cognition Arousal/Alertness: Awake/alert Behavior During Therapy: WFL for tasks assessed/performed Overall Cognitive Status: Within Functional Limits for tasks assessed                                        Exercises      General Comments General comments (skin  integrity, edema, etc.): Donned L LE prosthetic EOB with assist. 3 ply only used.       Pertinent Vitals/Pain Pain Assessment: Faces Faces Pain Scale: Hurts little more Pain Location: low back Pain Descriptors / Indicators: Discomfort Pain Intervention(s): Limited activity within patient's tolerance;Monitored during session;Repositioned    Home Living                      Prior Function            PT Goals (current goals can now be found in the care plan section) Acute Rehab PT Goals Patient Stated Goal: Hopeful for rehab  today PT Goal Formulation: With patient Time For Goal Achievement: 03/04/18 Potential to Achieve Goals: Good Progress towards PT goals: Progressing toward goals    Frequency    Min 2X/week      PT Plan Current plan remains appropriate    Co-evaluation              AM-PAC PT "6 Clicks" Daily Activity  Outcome Measure  Difficulty turning over in bed (including adjusting bedclothes, sheets and blankets)?: Unable Difficulty moving from lying on back to sitting on the side of the bed? : Unable Difficulty sitting down on and standing up from a chair with arms (e.g., wheelchair, bedside commode, etc,.)?: Unable Help needed moving to and from a bed to chair (including a wheelchair)?: A Lot Help needed walking in hospital room?: Total Help needed climbing 3-5 steps with a railing? : Total 6 Click Score: 7    End of Session Equipment Utilized During Treatment: Gait belt;Other (comment)(LLE prosthetic) Activity Tolerance: Patient tolerated treatment well Patient left: in chair;with call bell/phone within reach;with chair alarm set Nurse Communication: Mobility status PT Visit Diagnosis: Other abnormalities of gait and mobility (R26.89);Pain Pain - part of body: (Abdomen)     Time: 1540-0867 PT Time Calculation (min) (ACUTE ONLY): 27 min  Charges:  $Therapeutic Activity: 23-37 mins                     [name], PT, DPT Acute Rehabilitation Services Pager: [phone] Office: [phone]    [name] 03/11/2018, 12:11 PM

## 2018-03-11 NOTE — Progress Notes (Signed)
Attempt to call [facility] x1 with no asnwer. Dorthey Sawyer, RN

## 2018-03-11 NOTE — Progress Notes (Signed)
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  Ongoing  GOAL:   Patient will meet greater than or equal to 90% of their needs  Progressing  MONITOR:   PO intake, Diet advancement, Skin, Labs, I & O's  REASON FOR ASSESSMENT:   LOS    ASSESSMENT:   Mr. 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 Temp HD cath removed 10/21. 10/22 the pt developed acute abd pain worse on right side w/ N/V and inability to eat. CT abd/pelvis showed distended gallbladder w/ gallstones suspect for cholecystitis, and showed anasarca, ascites, and pleural effusions, progressive T12-L1 disctitis/osteomyelitis. S/p cholecystectiomy 10/25 removed necrotic gallbladder and cystic duct. Pt now with increased output from surgical drain.   10/31- ERCP confirming bile leak at cystic duct remnant, stent placed  Spoke with RN. Plan is for pt to discharge to SNF once insurance approved.  Spoke with pt at bedside who reports that he is eating "better." Pt states that he is ready to d/c but that he is waiting on insurance approval.  Noted 75% completed lunch meal tray at bedside. Pt denies any N/V or difficulty chewing or swallowing.  Pt states that he is not always eating the snacks provided. RD reiterated importance of adequate PO intake in healing.  Meal Completion: 75-100%  Medications reviewed and include: sliding scale Novolog, Lantus 15 units daily, rena-vit daily, Protonix 40 mg daily, Florastor daily, IV antibiotics  Labs reviewed: phosphorus 2.3 (L) CBG's: 145, 125, 184, 180 x 24 hours  UOP: 2150 x 24 hours JP  drain: 250 ml x 24 hours  Diet Order:   Diet Order            Diet heart healthy/carb modified Room service appropriate? Yes; Fluid consistency: Thin  Diet effective now        Diet Carb Modified              EDUCATION NEEDS:   Education needs have been addressed  Skin:  Skin Assessment: Skin Integrity Issues: Incisions: abdomen, R leg  Last BM:  11/4  Height:   Ht Readings from Last 1 Encounters:  03/06/18 5\' 10"  (1.778 m)    Weight:   Wt Readings from Last 1 Encounters:  03/06/18 105 kg    Ideal Body Weight:  66.5 kg  BMI:  Body mass index is 33.21 kg/m.  Estimated Nutritional Needs:   Kcal:  1800-2000 kcals   Protein:  90-100  Fluid:  UOP + 1000 mL    Gaynell Face, MS, RD, LDN Inpatient Clinical Dietitian Pager: [phone] Weekend/After Hours: [phone]

## 2018-03-11 NOTE — Discharge Summary (Addendum)
Discharge Summary  [name] OVF:[id number] DOB: Aug 07, 1955  PCP: [name], MD  Admit date: 01/27/2018 Discharge date: 03/11/2018  Time spent: 35 minutes  Recommendations for Outpatient Follow-up:  1. Follow-up with general surgery 2. Follow-up with GI 3. Follow-up with orthopedic surgery 4. Continue physical therapy 5. Take your medications as prescribed 6. Fall precautions  Discharge Diagnoses:  Active Hospital Problems   Diagnosis Date Noted  . Acute renal failure (ARF) ([place]) 01/27/2018  . Bile leak, postoperative   . Acute gangrenous cholecystitis s/p lap cholecystectomy 02/28/2018 03/02/2018  . AKI (acute kidney injury) ([place])   . Acute hypoxemic respiratory failure ([place]) 02/06/2018  . PAF (paroxysmal atrial fibrillation) ([place]) 02/05/2018  . Aortic atherosclerosis ([place]) 02/05/2018  . Hyponatremia 01/27/2018  . 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 S/P BKA (below knee amputation) unilateral, right ([place]) 12/08/2014  . Leukocytosis 07/28/2014  . Thrombocytopenia ([place]) 07/28/2014  . Insulin-requiring or dependent type II diabetes mellitus ([place]) 04/19/2009  . Essential hypertension 04/19/2009  . CAD (coronary artery disease) 04/19/2009    Resolved Hospital Problems   Diagnosis Date Noted Date Resolved  . Respiratory distress  02/05/2018  . Hypoglycemia due to insulin 01/27/2018 02/05/2018    Discharge Condition: Stable  Diet recommendation: Resume previous diet  Vitals:   03/11/18 1036 03/11/18 1639  BP: (!) 153/71 135/71  Pulse: 95 83  Resp: 20 19  Temp: 98.3 F (36.8 C) 98.2 F (36.8 C)  SpO2: 98% 98%    History of present illness:  62 y.o.malewith a history of sepsis due to right foot osteomyelitis s/p transtibial amputation and found to have MSSA bacteremia with splenic infarct and tricuspid valve endocarditis. He was discharged on ancef to [facility] but was quickly brought back in due to oliguria and acute renal failure. He  developed acute respiratory failure and transferred to ICU on CCM service. Antibiotics changed to daptomycin per ID and nephrology oversaw.  Hemodialysis through right IJ temporary catheter on 10/12 and 10/14.   With improvement in respiratory status he was transferred to the medical floor. Nephrology followed closely for signs of renal recovery.  Temporary HD catheter removed 10/21. On 10/22 the patient developed acute abdominal pain worse on the right with nausea, vomiting, and inability to take po. CT abd/pelvis demonstrated distended gallbladder with gallstones concerning for acute cholecystitis. Also showed anasarca, ascites, and pleural effusions, progressive T12-L1 changes noticed on previous study, and stable splenic lesions.  Hospital course complicated by suspected biliary leak which was confirmed on ERCP done on 03/06/2018, AKI which quickly resolved post IV fluid administration.  Also complicated by early morning hypoglycemia.  His long-acting insulin dose decreased with resolution of hypoglycemia events.   03/10/2018: POD #10 status post laparoscopic cholecystectomy.  Patient seen and examined at his bedside.  No acute events overnight.  INR still subtherapeutic and will continue to be monitored at SNF.  Patient has no new complaints.  03/11/18: seen and examined at bedside. No acute events. No new complaints.  On the day of discharge, the patient was hemodynamically stable.  He will need to follow-up with his PCP, general surgery, and GI post hospitalization.  He will also need to continue physical therapy and monitor INR.    Hospital Course:  Principal Problem:   Acute renal failure (ARF) (HCC) Active Problems:   Insulin-requiring or dependent type II diabetes mellitus ([place])   Essential hypertension   CAD (coronary artery disease)   Leukocytosis   Thrombocytopenia (HCC)   S/P BKA (  below knee amputation) unilateral, right (HCC)   Endocarditis of tricuspid valve   Hyponatremia    PAF (paroxysmal atrial fibrillation) (HCC)   Aortic atherosclerosis (HCC)   Acute hypoxemic respiratory failure (HCC)   AKI (acute kidney injury) ([place])   Acute gangrenous cholecystitis s/p lap cholecystectomy 02/28/2018   Bile leak, postoperative  Resolved AKI on CKD 3 Suspect prerenal from dehydration versus others Creatinine back to baseline 1.16 from 1.39 from 1.54 from 1.62  Continue to avoid nephrotoxic agents and dehydration  Acute gangrenous cholecystitis POD #10 status post laparoscopic cholecystectomy and placement of 19 F. Blake drain with concern for a leak ERCP completed on 03/06/2018 by GI which confirmed the biliary leak post stent placement  Biliary leak post ERCP with biliary stent placement Appears to be stable at this time Will follow-up with GI outpatient in 6 to 8 weeks. Continue Coumadin Continue to monitor output from surgical drain  MSSA bacteremia and tricuspid valve endocarditis complicated by splenic infarcts with sepsis Sirs criteria has resolved ID recommendations to complete daptomycin for 6 weeks treatment through 03/08/2018  Obtain CPK weekly  Following completion of daptomycin, ID recommends starting doxycycline 100 mg p.o. twice daily on a full stomach Continue to monitor fever curve and WBC Obtain CBC in the morning  P.Afib on coumadin Continue Coumadin INR is currently sub-therapeutic 1.47 Pharmacy managing Coumadin  Subtherapeutic INR Goal between 2 and 3 Continue Coumadin Repeat INR on 03/11/2018  Recurrent hypoglycemia resolved after decreasing insulin dose Decrease Lantus dose from 22 units to 15 units nightly Continue NovoLog 4 units before meals x3 times a day Continue insulin sliding scale Last A1c was 61 on 01/16/2018  T12-L1 discitis/osteomyelitis Continue doxycycline after completion of daptomycin  Severe PVD status post bilateral transtibial amputations Plan for prosthetic fitting outpatient with Dr. Sharol Given Fall  precaution Continue to work with physical therapy  Type 2 diabetes complicated by hyperglycemia Last A1c 8.5  Management as stated above   Code Status: Full code  Consultants:  General surgery  GI  Infectious disease  Pharmacy  Procedures:  Temporary HD catheter  Cholecystectomy 02/28/2018  ERCP on 03/06/2018  Antimicrobials:  Daptomycin >>> doxycycline  DVT prophylaxis: Coumadin    Discharge Exam: BP 135/71 (BP Location: Left Arm)   Pulse 83   Temp 98.2 F (36.8 C) (Oral)   Resp 19   Ht '5\' 10"'  (1.778 m)   Wt 105 kg   SpO2 98%   BMI 33.21 kg/m  . General: 62 y.o. year-old male pleasant WD wN NAD A&O x 3 . Cardiovascular: RRR no rubs or gallops. No JVD or thyromegaly. Marland Kitchen Respiratory: Clear to auscultation with no wheezes or rales. Good inspiratory effort. . Abdomen: Soft nontender nondistended with normal bowel sounds x4 quadrants.  Surgical drain in place with moderate amount of clear yellow fluid present. . Musculoskeletal: Bilateral tibial amputee . Psychiatry: Mood is appropriate for condition and setting  Discharge Instructions You were cared for by a hospitalist during your hospital stay. If you have any questions about your discharge medications or the care you received while you were in the hospital after you are discharged, you can call the unit and asked to speak with the hospitalist on call if the hospitalist that took care of you is not available. Once you are discharged, your primary care physician will handle any further medical issues. Please note that NO REFILLS for any discharge medications will be authorized once you are discharged, as it is imperative that you return  to your primary care physician (or establish a relationship with a primary care physician if you do not have one) for your aftercare needs so that they can reassess your need for medications and monitor your lab values.  Discharge Instructions    Diet Carb Modified    Complete by:  As directed    Home infusion instructions [facility] May follow [place] Dosing Protocol; May administer Cathflo as needed to maintain patency of vascular access device.; Flushing of vascular access device: per [facility] Protocol: 0.9% NaCl pre/post medica...   Complete by:  As directed    Instructions:  May follow [place] Dosing Protocol   Instructions:  May administer Cathflo as needed to maintain patency of vascular access device.   Instructions:  Flushing of vascular access device: per [facility] Protocol: 0.9% NaCl pre/post medication administration and prn patency; Heparin 100 u/ml, 64m for implanted ports and Heparin 10u/ml, 561mfor all other central venous catheters.   Instructions:  May follow [facility] Anaphylaxis Protocol for First Dose Administration in the home: 0.9% NaCl at 25-50 ml/hr to maintain IV access for protocol meds. Epinephrine 0.3 ml IV/IM PRN and Benadryl 25-50 IV/IM PRN s/s of anaphylaxis.   Instructions:  Ad[facility]ash[place]enfusion Coordinator (RN) to assist per patient IV care needs in the home PRN.   Home infusion instructions [facility] May follow A[place]osing Protocol; May administer Cathflo as needed to maintain patency of vascular access device.; Flushing of vascular access device: per [facility]: 0.9% NaCl pre/post medica...   Complete by:  As directed    Instructions:  May follow [place] Protocol   Instructions:  May administer Cathflo as needed to maintain patency of vascular access device.   Instructions:  Flushing of vascular access device: per [facility]: 0.9% NaCl pre/post medication administration and prn patency; Heparin 100 u/ml, 26m19mor implanted ports and Heparin 10u/ml, 26ml32mr all other central venous catheters.   Instructions:  May follow [facility] Anaphylaxis Protocol for First Dose Administration in the home: 0.9% NaCl at 25-50 ml/hr to maintain IV access for protocol meds. Epinephrine 0.3 ml IV/IM PRN and Benadryl  25-50 IV/IM PRN s/s of anaphylaxis.   Instructions:  Adva[place] Bendusion Coordinator (RN) to assist per patient IV care needs in the home PRN.   Increase activity slowly   Complete by:  As directed      Allergies as of 03/11/2018      Reactions   Ace Inhibitors Cough   Cefazolin    Possible cause of AKI, AIN      Medication List    STOP taking these medications   ceFAZolin  IVPB Commonly known as:  ANCEF   glipiZIDE 10 MG 24 hr tablet Commonly known as:  GLUCOTROL XL   losartan 50 MG tablet Commonly known as:  COZAAR   naproxen sodium 220 MG tablet Commonly known as:  ALEVE   oxyCODONE 5 MG immediate release tablet Commonly known as:  Oxy IR/ROXICODONE   TOUJEO SOLOSTAR 300 UNIT/ML Sopn Generic drug:  Insulin Glargine Replaced by:  insulin glargine 100 UNIT/ML injection   TRULICITY 1.5 MG/0IE/3.3IRn Generic drug:  Dulaglutide     TAKE these medications   aspirin 325 MG tablet Take 325 mg by mouth daily.   bisacodyl 10 MG suppository Commonly known as:  DULCOLAX Place 10 mg rectally once as needed (for constipation not relieved by Milk of Magnesia).   CALCIUM CITRATE + D3 250-200 MG-UNIT  Tabs Generic drug:  Calcium Citrate-Vitamin D Take 1 tablet by mouth 2 (two) times daily.   cetirizine 10 MG tablet Commonly known as:  ZYRTEC Take 10 mg by mouth daily.   daptomycin  IVPB Commonly known as:  CUBICIN Inject 850 mg into the vein daily. Indication:  Endocarditis/Osteo Last Day of Therapy:  03/12/18 Labs - Once weekly:  CBC/D, BMP, and CPK Labs - Every other week:  ESR and CRP   DAPTOmycin 850 mg in sodium chloride 0.9 % 100 mL Inject 850 mg into the vein daily at 8 pm for 2 days.   doxycycline 100 MG tablet Commonly known as:  VIBRA-TABS Take 1 tablet (100 mg total) by mouth 2 (two) times daily for 28 days. Start taking on:  03/13/2018   doxycycline 100 MG tablet Commonly known as:  VIBRA-TABS Take 1 tablet (100 mg total) by mouth every 12  (twelve) hours for 28 days. Start taking on:  03/13/2018   insulin aspart 100 UNIT/ML injection Commonly known as:  novoLOG Inject 4 Units into the skin 3 (three) times daily with meals. What changed:    how much to take  how to take this  when to take this  additional instructions   insulin aspart 100 UNIT/ML injection Commonly known as:  novoLOG Inject 0-5 Units into the skin 3 (three) times daily with meals. What changed:  You were already taking a medication with the same name, and this prescription was added. Make sure you understand how and when to take each.   insulin glargine 100 UNIT/ML injection Commonly known as:  LANTUS Inject 0.1 mLs (10 Units total) into the skin at bedtime. Replaces:  TOUJEO SOLOSTAR 300 UNIT/ML Sopn   ketoconazole 2 % cream Commonly known as:  NIZORAL Apply 1 application topically See admin instructions. Apply to both buttocks and "GL fold" rash two times a day   latanoprost 0.005 % ophthalmic solution Commonly known as:  XALATAN Place 1 drop into both eyes at bedtime.   magnesium hydroxide 400 MG/5ML suspension Commonly known as:  MILK OF MAGNESIA Take 30 mLs by mouth once as needed for mild constipation.   metoprolol tartrate 50 MG tablet Commonly known as:  LOPRESSOR Take 1 tablet (50 mg total) by mouth 2 (two) times daily. What changed:  See the new instructions.   multivitamin tablet Take 1 tablet by mouth daily.   nystatin powder Commonly known as:  MYCOSTATIN/NYSTOP Apply topically 2 (two) times daily.   pantoprazole 40 MG tablet Commonly known as:  PROTONIX Take 40 mg by mouth daily.   polyethylene glycol packet Commonly known as:  MIRALAX / GLYCOLAX Take 17 g by mouth daily as needed for mild constipation.   RA SALINE ENEMA 19-7 GM/118ML Enem Place 1 enema rectally once as needed (for constipation not relieved by Dulcolax suppository and notify MD if no relief from enema).   saccharomyces boulardii 250 MG  capsule Commonly known as:  FLORASTOR Take 1 capsule (250 mg total) by mouth 2 (two) times daily for 25 days.   simvastatin 20 MG tablet Commonly known as:  ZOCOR Take 20 mg by mouth daily at 6 PM.   warfarin 2 MG tablet Commonly known as:  COUMADIN Take 1 tablet (2 mg total) by mouth daily at 6 PM.            Home Infusion Instuctions  (From admission, onward)         Start     Ordered   03/10/18  0000  Home infusion instructions [facility] May follow [place] Dosing Protocol; May administer Cathflo as needed to maintain patency of vascular access device.; Flushing of vascular access device: per [facility] Protocol: 0.9% NaCl pre/post medica...    Question Answer Comment  Instructions May follow [place] Dosing Protocol   Instructions May administer Cathflo as needed to maintain patency of vascular access device.   Instructions Flushing of vascular access device: per [facility] Protocol: 0.9% NaCl pre/post medication administration and prn patency; Heparin 100 u/ml, 73m for implanted ports and Heparin 10u/ml, 536mfor all other central venous catheters.   Instructions May follow [facility] Anaphylaxis Protocol for First Dose Administration in the home: 0.9% NaCl at 25-50 ml/hr to maintain IV access for protocol meds. Epinephrine 0.3 ml IV/IM PRN and Benadryl 25-50 IV/IM PRN s/s of anaphylaxis.   Instructions [facility] Infusion Coordinator (RN) to assist per patient IV care needs in the home PRN.      03/10/18 1115   02/24/18 0000  Home infusion instructions [facility] May follow AC[place]osing Protocol; May administer Cathflo as needed to maintain patency of vascular access device.; Flushing of vascular access device: per [facility]: 0.9% NaCl pre/post medica...    Question Answer Comment  Instructions May follow ACDuc[place]osing Protocol   Instructions May administer Cathflo as needed to maintain patency of vascular access device.   Instructions Flushing of  vascular access device: per [facility]: 0.9% NaCl pre/post medication administration and prn patency; Heparin 100 u/ml, 68m37mor implanted ports and Heparin 10u/ml, 68ml7mr all other central venous catheters.   Instructions May follow [facility] Anaphylaxis Protocol for First Dose Administration in the home: 0.9% NaCl at 25-50 ml/hr to maintain IV access for protocol meds. Epinephrine 0.3 ml IV/IM PRN and Benadryl 25-50 IV/IM PRN s/s of anaphylaxis.   Instructions [facility] Infusion Coordinator (RN) to assist per patient IV care needs in the home PRN.      02/24/18 1141         Allergies  Allergen Reactions  . Ace Inhibitors Cough  . Cefazolin     Possible cause of AKI, AIN    Contact information for follow-up providers    DudaNewt Minion Follow up in 1 week(s).   Specialty:  Orthopedic Surgery Contact information: [phone]        [facility]. Schedule an appointment as soon as possible for a visit in 2 weeks.   Specialties:  [facility] Medicine Why:  Appointment date on 03/19/2018 1:00p  Contact information: [phone]        [facility]             . Schedule an appointment as soon as possible for a visit.   Why:  Dr. SnidBaxter Flattery14 @ 3:00pm. If you are not able to make this time please call the office to reschedule.  Contact information: [address] [place] [phone]   BlacCoralie Keens On 03/21/2018.   Specialty:  General Surgery Why:  11:00am, arrive by 10:30am for paperwork and checkin.  If patient is coming from a facility, someone from that facility must stay with patient for the duration of his visit.  If not, the appointment may be cancelled and rescheduled. Contact information: [address] [place]  [address] [phone]        Yetta Flock, MD On 04/08/2018.   Specialty:  Gastroenterology Why:  2:30 PM follow up of bile leak and at office visit arrangements will be made for future endoscopic procedure to remove stent placed in bile duct.  Contact information: [address] [place] [address] [phone]            Contact information for after-discharge care    Destination    [facility].   Service:  Skilled Nursing Contact information: [address] [place] [phone]                   The results of significant diagnostics from this hospitalization (including imaging, microbiology, ancillary and laboratory) are listed below for reference.    Significant Diagnostic Studies: Ct Abdomen Pelvis Wo Contrast  Result Date: 02/25/2018 CLINICAL DATA:  Pt is septic with nausea, vomiting, abdomen pain. eval for gastroenteritis and colitis EXAM: CT ABDOMEN AND PELVIS WITHOUT CONTRAST TECHNIQUE: Multidetector CT imaging of the abdomen and pelvis was performed following the standard protocol without IV contrast. COMPARISON:  01/29/2018, 01/14/2018 FINDINGS: Lower chest: There are increased bilateral pleural effusions, associated with bibasilar atelectasis. There is dense atherosclerotic calcification of coronary arteries. Hepatobiliary: Liver is diffusely low attenuation. No focal liver lesions are identified. There is periportal edema. Gallbladder is distended compare with prior studies. Layering, depending calcified gallstones are present. Pancreas: Unremarkable. No pancreatic ductal dilatation or surrounding inflammatory changes. Spleen: Low-attenuation lesion within the posterior aspect of the spleen has been better demonstrated on previous contrast-enhanced exams. The spleen is UPPER limits normal in size. Adrenals/Urinary Tract: The adrenal glands are normal in appearance. No intrarenal calculi and or ureteral obstruction. No renal mass. There is air within the  urinary bladder raising the question of infectious process or recent catheterization. Stomach/Bowel: The stomach and small bowel loops are normal in appearance. The appendix is well seen and has a normal appearance. Loops of colon are normal in caliber and wall thickness. No acute diverticulitis or wall thickening. Vascular/Lymphatic: There is atherosclerotic calcification of the abdominal aorta and its branches. No aneurysm. No retroperitoneal or mesenteric adenopathy. Reproductive: Prostate is unremarkable. Densely calcified vas deferens. Other: Small amount of ascites in the pelvis, perihepatic region, and paracolic gutters. There is diffuse body wall edema and mesenteric edema. Musculoskeletal: There is persistent sclerosis, irregularity, and destruction of the RIGHT aspect of T12 and L1, suspicious for osteomyelitis/discitis. There is further destruction since the study of 01/14/2018. Degenerative changes are noted at L4-5. IMPRESSION: 1. Increased bilateral pleural effusions, anasarca, and ascites. 2. Distended gallbladder and gallstones raising the question of acute cholecystitis. 3. Air within the urinary bladder suspicious for infection or recent catheterization. 4.  Aortic atherosclerosis.  (ICD10-I70.0) 5. Changes at T12-L1 suspicious for discitis/osteomyelitis, progressed since study performed September 10th. Consider further evaluation with MRI. 6. No evidence for colitis or gastroenteritis. No urinary tract obstruction. 7. Hepatic steatosis. 8. Stable splenic lesion, possibly representing infarct. Electronically Signed   By: Nolon Nations M.D.   On: 02/25/2018 14:54   Nm Hepatobiliary Liver Func  Result Date: 03/03/2018 CLINICAL DATA:  Status post cholecystectomy. Bile output from surgical drain. Evaluate for postop bile leak. EXAM: NUCLEAR MEDICINE HEPATOBILIARY IMAGING TECHNIQUE: Sequential images of the abdomen were obtained out to 60 minutes following intravenous administration of  radiopharmaceutical. RADIOPHARMACEUTICALS:  5.2 mCi Tc-34m Choletec IV COMPARISON:  02/26/2018 FINDINGS: Prompt uptake and biliary excretion of  activity by the liver is seen. Biliary activity passes into small bowel, consistent with patent common bile duct. No gallbladder activity is seen, consistent with prior cholecystectomy. There is no evidence of leak or extravasation of biliary activity IMPRESSION: Normal study status post cholecystectomy. No evidence of postop bile leak or biliary obstruction. Electronically Signed   By: Earle Gell M.D.   On: 03/03/2018 17:52   Nm Hepatobiliary Liver Func  Result Date: 02/26/2018 CLINICAL DATA:  Abdominal pain. Abnormal gallbladder on a recent CT. EXAM: NUCLEAR MEDICINE HEPATOBILIARY IMAGING TECHNIQUE: Sequential images of the abdomen were obtained out to 60 minutes following intravenous administration of radiopharmaceutical. RADIOPHARMACEUTICALS:  5.15 mCi Tc-4m Choletec IV COMPARISON:  CT, 02/25/2018 FINDINGS: There is prompt radiotracer accumulation by the liver. Prompt excretion is seen into the intra and extrahepatic biliary tree with small bowel activity seen early during the exam. No gallbladder activity was seen after 1 hour of imaging following radiotracer injection. The patient was then injected with 3 mg of morphine to induce sphincter of Odi spasm followed by additional imaging. No gallbladder activity was documented on the follow-up imaging. IMPRESSION: 1. Nonvisualization of the gallbladder consistent with an obstructed cystic duct and acute cholecystitis. Electronically Signed   By: DLajean ManesM.D.   On: 02/26/2018 13:19   Ir Fluoro Guide Cv Line Right  Result Date: 02/13/2018 INDICATION: 62year old male with end-stage renal disease on hemodialysis. He currently has a right IJ approach non tunneled hemodialysis catheter and requires a tunneled hemodialysis catheter. EXAM: TUNNELED CENTRAL VENOUS HEMODIALYSIS CATHETER PLACEMENT WITH ULTRASOUND  AND FLUOROSCOPIC GUIDANCE MEDICATIONS: 2 g Ancef. The antibiotic was given in an appropriate time interval prior to skin puncture. ANESTHESIA/SEDATION: Moderate (conscious) sedation was employed during this procedure. A total of Versed 1.5 mg and Fentanyl 75 mcg was administered intravenously. Moderate Sedation Time: 17 minutes. The patient's level of consciousness and vital signs were monitored continuously by radiology nursing throughout the procedure under my direct supervision. FLUOROSCOPY TIME:  Fluoroscopy Time: 0 minutes 30 seconds (6 mGy). COMPLICATIONS: None immediate. PROCEDURE: Informed written consent was obtained from the patient after a discussion of the risks, benefits, and alternatives to treatment. Questions regarding the procedure were encouraged and answered. The right neck and chest were prepped with chlorhexidine in a sterile fashion, and a sterile drape was applied covering the operative field. Maximum barrier sterile technique with sterile gowns and gloves were used for the procedure. A timeout was performed prior to the initiation of the procedure. After creating a small venotomy incision, a micropuncture kit was utilized to access the right internal jugular vein under direct, real-time ultrasound guidance after the overlying soft tissues were anesthetized with 1% lidocaine with epinephrine. Ultrasound image documentation was performed. The microwire was kinked to measure appropriate catheter length. A stiff Glidewire was advanced to the level of the IVC and the micropuncture sheath was exchanged for a peel-away sheath. A Palindrome tunneled hemodialysis catheter measuring 19 cm from tip to cuff was tunneled in a retrograde fashion from the anterior chest wall to the venotomy incision. The catheter was then placed through the peel-away sheath with tips ultimately positioned within the superior aspect of the right atrium. Final catheter positioning was confirmed and documented with a spot  radiographic image. The catheter aspirates and flushes normally. The catheter was flushed with appropriate volume heparin dwells. The catheter exit site was secured with a 0-Prolene retention suture. The venotomy incision was closed with an interrupted 4-0 Vicryl, Dermabond and Steri-strips. Dressings were  applied. The patient tolerated the procedure well without immediate post procedural complication. IMPRESSION: Successful placement of 19 cm tip to cuff tunneled hemodialysis catheter via the right internal jugular vein with tips terminating within the superior aspect of the right atrium. The catheter is ready for immediate use. Electronically Signed   By: Jacqulynn Cadet M.D.   On: 02/13/2018 16:17   Ir Removal Tun Cv Cath W/o Fl  Result Date: 02/24/2018 INDICATION: 62 year old with recovering acute kidney injury. Dialysis catheter is no longer needed. EXAM: REMOVAL TUNNELED CENTRAL VENOUS CATHETER MEDICATIONS: None ANESTHESIA/SEDATION: None FLUOROSCOPY TIME:  None COMPLICATIONS: None immediate. PROCEDURE: Informed written consent was obtained from the patient after a thorough discussion of the procedural risks, benefits and alternatives. All questions were addressed. Heparin was removed from both lumens. The retention suture was removed. Catheter was easily removed with manual traction. Small amount of bleeding at the catheter exit site. Bandage placed at the catheter exit site. IMPRESSION: Successful catheter removal as described above. Electronically Signed   By: Markus Daft M.D.   On: 02/24/2018 12:47   Ir US Guide Vasc Access Right  Result Date: 02/13/2018 INDICATION: 62 year old male with end-stage renal disease on hemodialysis. He currently has a right IJ approach non tunneled hemodialysis catheter and requires a tunneled hemodialysis catheter. EXAM: TUNNELED CENTRAL VENOUS HEMODIALYSIS CATHETER PLACEMENT WITH ULTRASOUND AND FLUOROSCOPIC GUIDANCE MEDICATIONS: 2 g Ancef. The antibiotic was given  in an appropriate time interval prior to skin puncture. ANESTHESIA/SEDATION: Moderate (conscious) sedation was employed during this procedure. A total of Versed 1.5 mg and Fentanyl 75 mcg was administered intravenously. Moderate Sedation Time: 17 minutes. The patient's level of consciousness and vital signs were monitored continuously by radiology nursing throughout the procedure under my direct supervision. FLUOROSCOPY TIME:  Fluoroscopy Time: 0 minutes 30 seconds (6 mGy). COMPLICATIONS: None immediate. PROCEDURE: Informed written consent was obtained from the patient after a discussion of the risks, benefits, and alternatives to treatment. Questions regarding the procedure were encouraged and answered. The right neck and chest were prepped with chlorhexidine in a sterile fashion, and a sterile drape was applied covering the operative field. Maximum barrier sterile technique with sterile gowns and gloves were used for the procedure. A timeout was performed prior to the initiation of the procedure. After creating a small venotomy incision, a micropuncture kit was utilized to access the right internal jugular vein under direct, real-time ultrasound guidance after the overlying soft tissues were anesthetized with 1% lidocaine with epinephrine. Ultrasound image documentation was performed. The microwire was kinked to measure appropriate catheter length. A stiff Glidewire was advanced to the level of the IVC and the micropuncture sheath was exchanged for a peel-away sheath. A Palindrome tunneled hemodialysis catheter measuring 19 cm from tip to cuff was tunneled in a retrograde fashion from the anterior chest wall to the venotomy incision. The catheter was then placed through the peel-away sheath with tips ultimately positioned within the superior aspect of the right atrium. Final catheter positioning was confirmed and documented with a spot radiographic image. The catheter aspirates and flushes normally. The catheter  was flushed with appropriate volume heparin dwells. The catheter exit site was secured with a 0-Prolene retention suture. The venotomy incision was closed with an interrupted 4-0 Vicryl, Dermabond and Steri-strips. Dressings were applied. The patient tolerated the procedure well without immediate post procedural complication. IMPRESSION: Successful placement of 19 cm tip to cuff tunneled hemodialysis catheter via the right internal jugular vein with tips terminating within the superior aspect of  the right atrium. The catheter is ready for immediate use. Electronically Signed   By: Jacqulynn Cadet M.D.   On: 02/13/2018 16:17   Dg Chest Port 1 View  Result Date: 02/10/2018 CLINICAL DATA:  Short of breath. EXAM: PORTABLE CHEST 1 VIEW COMPARISON:  02/10/2018 at 5:34 a.m. FINDINGS: Hazy central and lower lung airspace opacities have mildly improved. Interstitial thickening has improved. Small pleural effusions appear decreased since the prior exam. Right internal jugular central venous line and right PICC are stable. There stable changes from cardiac surgery. IMPRESSION: 1. Interval improvement in congestive heart failure and pulmonary edema. There is still mild residual airspace edema and small effusions. No new abnormalities. Electronically Signed   By: Lajean Manes M.D.   On: 02/10/2018 20:59   Dg Chest Port 1 View  Result Date: 02/10/2018 CLINICAL DATA:  Shortness of breath. EXAM: PORTABLE CHEST 1 VIEW COMPARISON:  01/31/2018 FINDINGS: Right dialysis catheter tip in the mid SVC. Right upper extremity PICC tip in the mid distal SVC. Element of bilateral perihilar opacities and Kerley B-lines consistent with pulmonary edema. Increased bilateral pleural effusions. Cardiomegaly is unchanged. Prosthetic aortic valve. No pneumothorax. IMPRESSION: CHF with moderate pulmonary edema and pleural effusions. Cardiomegaly appears similar. Electronically Signed   By: Keith Rake M.D.   On: 02/10/2018 05:50   Dg  Ercp Biliary & Pancreatic Ducts  Result Date: 03/06/2018 CLINICAL DATA:  62 year old male with a history of postoperative bile leak. EXAM: ERCP TECHNIQUE: Multiple spot images obtained with the fluoroscopic device and submitted for interpretation post-procedure. FLUOROSCOPY TIME:  Fluoroscopy Time:  8 minutes 3 seconds reported COMPARISON:  None. FINDINGS: A total of 9 intraoperative saved images are submitted for review. The images demonstrate a flexible endoscope in the descending duodenum. Initially, the main pancreatic duct is catheterized and a pancreatic ductogram performed. Pancreatic duct appears normal. Subsequently, the common bile duct is catheterized and a cholangiogram was performed. A balloon occluded cholangiogram demonstrates extravasation of contrast from the stump of the cystic duct. No evidence of biliary obstruction, stenosis or stricture. On the final image, a plastic biliary stent has been placed to facilitate decompression. IMPRESSION: 1. Positive for bile leak emanating from the cystic duct stump. 2. Successful placement of a plastic biliary stent. These images were submitted for radiologic interpretation only. Please see the procedural report for the amount of contrast and the fluoroscopy time utilized. Electronically Signed   By: Jacqulynn Cadet M.D.   On: 03/06/2018 16:53   Dg Abd Portable 1v  Result Date: 02/11/2018 CLINICAL DATA:  RIGHT LOWER QUADRANT abdominal pain and tenderness. EXAM: PORTABLE ABDOMEN - 1 VIEW COMPARISON:  02/03/2018, 01/28/2018. CT abdomen and pelvis 01/29/2018. FINDINGS: Bowel gas pattern unremarkable without evidence of obstruction or significant ileus. No acute abdominal abnormality. Stool burden in the colon. No visible opaque urinary tract calculi. Degenerative changes involving the visualized thoracic and lumbar spine. IMPRESSION: Negative. Electronically Signed   By: Evangeline Dakin M.D.   On: 02/11/2018 13:40    Microbiology: No results found  for this or any previous visit (from the past 240 hour(s)).   Labs: Basic Metabolic Panel: Recent Labs  Lab 03/08/18 0521 03/08/18 1041 03/09/18 0313 03/10/18 0333 03/11/18 0329  NA 138 138 139 138 140  K 3.3* 3.5 3.5 3.5 3.6  CL 117* 117* 115* 117* 117*  CO2 18* 18* 18* 18* 19*  GLUCOSE 72 175* 89 151* 130*  BUN 29* 28* 25* 18 15  CREATININE 1.62* 1.54* 1.39* 1.14 1.08  CALCIUM 7.5* 7.4* 7.5* 7.2* 7.8*  MG  --  1.5*  --   --   --   PHOS 3.0 2.8 2.6 2.6 2.3*   Liver Function Tests: Recent Labs  Lab 03/08/18 0521 03/08/18 1041 03/09/18 0313 03/10/18 0333 03/11/18 0329  AST  --  44*  --   --   --   ALT  --  14  --   --   --   ALKPHOS  --  85  --   --   --   BILITOT  --  0.3  --   --   --   PROT  --  4.8*  --   --   --   ALBUMIN 1.8* 1.8* 1.7* 1.7* 1.8*   No results for input(s): LIPASE, AMYLASE in the last 168 hours. No results for input(s): AMMONIA in the last 168 hours. CBC: Recent Labs  Lab 03/05/18 0402 03/06/18 0605 03/08/18 1041 03/10/18 0652  WBC 13.4* 12.1* 13.6* 12.7*  NEUTROABS  --  10.0* 11.7* 10.7*  HGB 9.8* 10.7* 9.1* 9.6*  HCT 32.9* 36.1* 31.9* 32.9*  MCV 92.4 91.4 94.1 92.7  PLT 169 188 159 147*   Cardiac Enzymes: No results for input(s): CKTOTAL, CKMB, CKMBINDEX, TROPONINI in the last 168 hours. BNP: BNP (last 3 results) No results for input(s): BNP in the last 8760 hours.  ProBNP (last 3 results) No results for input(s): PROBNP in the last 8760 hours.  CBG: Recent Labs  Lab 03/10/18 1621 03/10/18 2124 03/11/18 0743 03/11/18 1134 03/11/18 1640  GLUCAP 180* 184* 125* 145* 143*       Signed:  Kayleen Memos, MD [facility] 03/11/2018, 5:11 PM

## 2018-03-11 NOTE — Clinical Social Work Placement (Signed)
   CLINICAL SOCIAL WORK PLACEMENT  NOTE 03/11/18 - DISCHARGED TO [facility]IA AMBULANCE  Date:  03/11/2018  Patient Details  Name: Benaiah Behan MRN: [id number] Date of Birth: [birth year]  Clinical Social Work is seeking post-discharge placement for this patient at the [place] level of care (*CSW will initial, date and re-position this form in  chart as items are completed):      Patient/family provided with [facility] Work Department's list of facilities offering this level of care within the geographic area requested by the patient (or if unable, by the patient's family).  Yes   Patient/family informed of their freedom to choose among providers that offer the needed level of care, that participate in Medicare, Medicaid or managed care program needed by the patient, have an available bed and are willing to accept the patient.  Yes   Patient/family informed of [facility]'s ownership interest in [facility] and [facility], as well as of the fact that they are under no obligation to receive care at these facilities.  PASRR submitted to EDS on       PASRR number received on       Existing PASRR number confirmed on 01/31/18     FL2 transmitted to all facilities in geographic area requested by pt/family on 02/02/18     FL2 transmitted to all facilities within larger geographic area on       Patient informed that his/her managed care company has contracts with or will negotiate with certain facilities, including the following:        Yes   Patient/family informed of bed offers received.  Patient chooses bed at [facility]     Physician recommends and patient chooses bed at      Patient to be transferred to [place] on 03/11/18.  Patient to be transferred to facility by Ambulance     Patient family notified on 03/11/18 of transfer.  Name of family member notified:  NIece Oziah Vitanza called and message left regarding patient  d/c (name not used)     PHYSICIAN       Additional Comment:    _______________________________________________ Sable Feil, LCSW 03/11/2018, 5:37 PM

## 2018-03-11 NOTE — Progress Notes (Signed)
Patient transported to [facility] with [facility] via stretcher. Discharge instructions given to [facility].   PICC line hep lock by IV team.

## 2018-03-11 NOTE — Progress Notes (Signed)
ANTICOAGULATION CONSULT NOTE - Follow Up Consult  Pharmacy Consult for Coumadin  Indication: atrial fibrillation   Allergies  Allergen Reactions  . Ace Inhibitors Cough  . Cefazolin     Possible cause of AKI, AIN    Patient Measurements: Height: 5\' 10"  (177.8 cm) Weight: 231 lb 7.7 oz (105 kg) IBW/kg (Calculated) : 73  Vital Signs: Temp: 97.7 F (36.5 C) (11/05 0632) Temp Source: Oral (11/05 2440) BP: 162/71 (11/05 1027) Pulse Rate: 85 (11/05 2536)  Labs: Recent Labs    03/08/18 1041 03/09/18 0313 03/10/18 0333 03/10/18 0652 03/11/18 0329  HGB 9.1*  --   --  9.6*  --   HCT 31.9*  --   --  32.9*  --   PLT 159  --   --  147*  --   LABPROT  --  18.1* 17.6*  --  18.7*  INR  --  1.52 1.47  --  1.58  CREATININE 1.54* 1.39* 1.14  --  1.08    Estimated Creatinine Clearance: 87.2 mL/min (by C-G formula based on SCr of 1.08 mg/dL).  Assessment: 62 yo M with new onset PAF on Coumadin. Coumadin started on 10/1, achieved therapeutic INR, then held and reversed 10/7 for [facility] placement, 10/24 for lap chole, and 10/30 for ERCP. Warfarin dosing resumed on 10/31 post-op. Pharmacy consulted for warfarin dosing.   INR today remains SUBtherapeutic however trending back up (INR 1.58 << 1.47, goal of 2-3). No CBC today, from 11/4 labs CBC low but stable - no bleeding noted. Po intake 75-100%.   Goal of Therapy:  INR 2-3 Monitor platelets by anticoagulation protocol: Yes   Plan:  - Warfarin 4 mg x 1 dose at 1800 today (if still here) - F/u daily PT/INR - Will continue to monitor for any signs/symptoms of bleeding and will follow up with PT/INR in the a.m. (if still here)  Thank you for allowing pharmacy to be a part of this patient's care.  Alycia Rossetti, PharmD, BCPS Clinical Pharmacist Pager: [phone] Clinical phone for 03/11/2018 from 7a-3:30p: [phone] If after 3:30p, please call main pharmacy at: [phone] Please check AMION for all [facility] numbers 03/11/2018 8:06 AM

## 2018-03-11 NOTE — Progress Notes (Signed)
Attempt to call [facility] report x 2. Dorthey Sawyer, RN

## 2018-03-20 ENCOUNTER — Encounter (INDEPENDENT_AMBULATORY_CARE_PROVIDER_SITE_OTHER): Payer: Self-pay | Admitting: Physician Assistant

## 2018-03-20 ENCOUNTER — Ambulatory Visit (INDEPENDENT_AMBULATORY_CARE_PROVIDER_SITE_OTHER): Payer: BLUE CROSS/BLUE SHIELD | Admitting: Internal Medicine

## 2018-03-20 ENCOUNTER — Encounter: Payer: Self-pay | Admitting: Internal Medicine

## 2018-03-20 ENCOUNTER — Ambulatory Visit (INDEPENDENT_AMBULATORY_CARE_PROVIDER_SITE_OTHER): Payer: BLUE CROSS/BLUE SHIELD | Admitting: Physician Assistant

## 2018-03-20 VITALS — BP 155/85 | HR 86 | Temp 97.1°F | Ht 70.0 in | Wt 192.0 lb

## 2018-03-20 VITALS — Ht 70.0 in | Wt 231.0 lb

## 2018-03-20 DIAGNOSIS — A4101 Sepsis due to Methicillin susceptible Staphylococcus aureus: Secondary | ICD-10-CM | POA: Diagnosis not present

## 2018-03-20 DIAGNOSIS — E1142 Type 2 diabetes mellitus with diabetic polyneuropathy: Secondary | ICD-10-CM

## 2018-03-20 DIAGNOSIS — Z89511 Acquired absence of right leg below knee: Secondary | ICD-10-CM

## 2018-03-20 DIAGNOSIS — Z952 Presence of prosthetic heart valve: Secondary | ICD-10-CM

## 2018-03-20 DIAGNOSIS — I739 Peripheral vascular disease, unspecified: Secondary | ICD-10-CM

## 2018-03-20 DIAGNOSIS — Z89512 Acquired absence of left leg below knee: Secondary | ICD-10-CM

## 2018-03-20 DIAGNOSIS — M4644 Discitis, unspecified, thoracic region: Secondary | ICD-10-CM | POA: Diagnosis not present

## 2018-03-20 DIAGNOSIS — E43 Unspecified severe protein-calorie malnutrition: Secondary | ICD-10-CM

## 2018-03-20 NOTE — Progress Notes (Signed)
Recently finished Cubicin 11/6, Picc pulled.  Started Doxy 03/13/18 Complaints of lower back pain and prescribed Robaxin 500mg  PO QID PRN toady by Dr. Sharol Given  Call [facility] and made them arrive that patient was ready to be transported back to [place]

## 2018-03-20 NOTE — Progress Notes (Signed)
TMA:[id number] up for discitis Patient ID: [name], male   DOB: [birth year], 62 y.o.   MRN: [id number]   62 y.o.malewith complicated medical history ofMSSA bacteremia with splenic infarct and tricuspid valve endocarditis complicated by acute renal failure. On 10/22 the patient developed acute abdominal pain worse on the right with nausea, vomiting, and inability to take po. CT abd/pelvis demonstrated distended gallbladder with gallstones concerning for acute cholecystitis.he underwent lap chole but c/b biliary leak which was confirmed on ERCP done on 03/06/2018, s/p drain placement. One of his imaging suggested that he had  T12-L1 discitis/osteomyelitis  thsu he was treated through Nov 7th with daptomycin, and planned to have him follow up in the ID clinic for chronic suppression with doxycycline 142m PO BID for addn 4wk.. As his renal function continues to improve, may consider getting MRI in the coming 1-2 wk  He finished IV abtx last week, had picc line removed 3 days ago. [facility]. He still has drain in place where he has roughly 2012mserous fluid drainage per day. His bulb gets emptied twice a day.   He reports low back pain seen by orthopedics,giving him robaxin  No fever, chills, nightsweats.  no diarrhea   Outpatient Encounter Medications as of 03/20/2018  Medication Sig  . aspirin 325 MG tablet Take 325 mg by mouth daily.    . bisacodyl (DULCOLAX) 10 MG suppository Place 10 mg rectally once as needed (for constipation not relieved by Milk of Magnesia).   . Calcium Citrate-Vitamin D (CALCIUM CITRATE + D3) 250-200 MG-UNIT TABS Take 1 tablet by mouth 2 (two) times daily.  . cetirizine (ZYRTEC) 10 MG tablet Take 10 mg by mouth daily.  . insulin aspart (NOVOLOG) 100 UNIT/ML injection Inject 4 Units into the skin 3 (three) times daily with meals.  . insulin aspart (NOVOLOG) 100 UNIT/ML injection Inject 0-5 Units into the skin 3 (three)  times daily with meals.  . insulin glargine (LANTUS) 100 UNIT/ML injection Inject 0.1 mLs (10 Units total) into the skin at bedtime.  . Marland Kitchenetoconazole (NIZORAL) 2 % cream Apply 1 application topically See admin instructions. Apply to both buttocks and "GL fold" rash two times a day  . latanoprost (XALATAN) 0.005 % ophthalmic solution Place 1 drop into both eyes at bedtime.  . magnesium hydroxide (MILK OF MAGNESIA) 400 MG/5ML suspension Take 30 mLs by mouth once as needed for mild constipation.  . metoprolol tartrate (LOPRESSOR) 50 MG tablet Take 1 tablet (50 mg total) by mouth 2 (two) times daily.  . Multiple Vitamin (MULTIVITAMIN) tablet Take 1 tablet by mouth daily.    . Marland Kitchenystatin (MYCOSTATIN/NYSTOP) powder Apply topically 2 (two) times daily.  . pantoprazole (PROTONIX) 40 MG tablet Take 40 mg by mouth daily.    . polyethylene glycol (MIRALAX / GLYCOLAX) packet Take 17 g by mouth daily as needed for mild constipation.  . saccharomyces boulardii (FLORASTOR) 250 MG capsule Take 1 capsule (250 mg total) by mouth 2 (two) times daily for 25 days.  . simvastatin (ZOCOR) 20 MG tablet Take 20 mg by mouth daily at 6 PM.   . Sodium Phosphates (RA SALINE ENEMA) 19-7 GM/118ML ENEM Place 1 enema rectally once as needed (for constipation not relieved by Dulcolax suppository and notify MD if no relief from enema).  . warfarin (COUMADIN) 2 MG tablet Take 1 tablet (2 mg total) by mouth daily at 6 PM.  . daptomycin (CUBICIN) IVPB Inject 850 mg into the  vein daily. Indication:  Endocarditis/Osteo Last Day of Therapy:  03/12/18 Labs - Once weekly:  CBC/D, BMP, and CPK Labs - Every other week:  ESR and CRP (Patient not taking: Reported on 03/20/2018)  . doxycycline (VIBRA-TABS) 100 MG tablet Take 1 tablet (100 mg total) by mouth 2 (two) times daily for 28 days. (Patient not taking: Reported on 03/20/2018)  . doxycycline (VIBRA-TABS) 100 MG tablet Take 1 tablet (100 mg total) by mouth every 12 (twelve) hours for 28 days.  (Patient not taking: Reported on 03/20/2018)  . [DISCONTINUED] furosemide (LASIX) 40 MG tablet Take 40 mg by mouth 2 (two) times daily.     No facility-administered encounter medications on file as of 03/20/2018.      Patient Active Problem List   Diagnosis Date Noted  . Bile leak, postoperative   . Acute gangrenous cholecystitis s/p lap cholecystectomy 02/28/2018 03/02/2018  . AKI (acute kidney injury) ([place])   . Acute hypoxemic respiratory failure ([place]) 02/06/2018  . PAF (paroxysmal atrial fibrillation) ([place]) 02/05/2018  . Aortic atherosclerosis ([place]) 02/05/2018  . Acute renal failure (ARF) ([place]) 01/27/2018  . Hyponatremia 01/27/2018  . 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 S/P bilateral BKA (below knee amputation) ([place])   . Post-operative pain   . Benign essential HTN   . Diabetes mellitus type 2 in obese ([place])   . PVD (peripheral vascular disease) ([place])   . History of prosthetic mitral valve   . Right foot infection 01/15/2018  . Sepsis ([place]) 01/15/2018  . Prosthetic valve endocarditis ([place])   . Staphylococcus aureus bacteremia   . 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 Acute hematogenous osteomyelitis, right ankle and foot ([place])   . Splenic infarct 01/14/2018  . Renal insufficiency 01/14/2018  . Severe sepsis ([place]) 01/14/2018  . Dupuytren's disease of palm of both hands 03/18/2017  . Cellulitis of left lower extremity 04/26/2016  . S/P BKA (below knee amputation) unilateral, right ([place]) 12/08/2014  . Bicuspid aortic valve   . Hyperlipidemia   . Hypertension   . Greater trochanter fracture ([place]) 07/28/2014  . Hip fracture, right ([place]) 07/28/2014  . HLD (hyperlipidemia) 07/28/2014  . GERD (gastroesophageal reflux disease) 07/28/2014  . Hip fracture ([place]) 07/28/2014  . Fall 07/28/2014  . Leukocytosis 07/28/2014  . Thrombocytopenia ([place]) 07/28/2014  . H/O aortic valve replacement 10/16/2011  . 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 PLEURAL  EFFUSION 05/25/2009  . OPEN WOUND OF CHEST , COMPLICATED 66/29/4765  . PROSTHETIC VALVE - BIO/ ENDOPROSTHESIS 05/25/2009  . Insulin-requiring or dependent type II diabetes mellitus ([place]) 04/19/2009  . HYPERLIPIDEMIA 04/19/2009  . Essential hypertension 04/19/2009  . CAD (coronary artery disease) 04/19/2009  . Peripheral vascular disease ([place]) 04/19/2009  . OSTEOMYELITIS 04/19/2009  . BICUSPID AORTIC VALVE 04/19/2009     Health Maintenance Due  Topic Date Due  . PNEUMOCOCCAL POLYSACCHARIDE VACCINE AGE 74-64 HIGH RISK  12/23/1958  . FOOT EXAM  12/23/1966  . OPHTHALMOLOGY EXAM  12/23/1966  . URINE MICROALBUMIN  12/23/1966  . TETANUS/TDAP  12/23/1975  . COLONOSCOPY  12/23/2006  . INFLUENZA VACCINE  12/05/2017     Review of Systems + still having back pain but overall improved. 12 point ros is otherwise negative Physical Exam   BP (!) 155/85   Pulse 86   Temp (!) 97.1 F (36.2 C)   Ht 5' 10" (1.778 m)   Wt 192 lb (87.1 kg)   BMI 27.55 kg/m   Physical Exam  Constitutional: He is  oriented to person, place, and time. He appears well-developed and well-nourished. No distress.  HENT:  Mouth/Throat: Oropharynx is clear and moist. No oropharyngeal exudate.  Cardiovascular: Normal rate, regular rhythm and normal heart sounds. Exam reveals no gallop and no friction rub.  No murmur heard.  Pulmonary/Chest: Effort normal and breath sounds normal. No respiratory distress. He has no wheezes.  Abdominal: Soft. Bowel sounds are normal. He exhibits no distension. There is no tenderness. abd drain + Lymphadenopathy:  He has no cervical adenopathy.  Neurological: He is alert and oriented to person, place, and time.  Skin: Skin is warm and dry. No rash noted. No erythema.  Psychiatric: He has a normal mood and affect. His behavior is normal.    Lab Results  Component Value Date   HEPBSAB Reactive 01/29/2018   No results found for: RPR, LABRPR  CBC Lab Results  Component Value Date     WBC 12.7 (H) 03/10/2018   RBC 3.55 (L) 03/10/2018   HGB 9.6 (L) 03/10/2018   HCT 32.9 (L) 03/10/2018   PLT 147 (L) 03/10/2018   MCV 92.7 03/10/2018   MCH 27.0 03/10/2018   MCHC 29.2 (L) 03/10/2018   RDW 15.9 (H) 03/10/2018   LYMPHSABS 1.0 03/10/2018   MONOABS 0.7 03/10/2018   EOSABS 0.2 03/10/2018    BMET Lab Results  Component Value Date   NA 140 03/11/2018   K 3.6 03/11/2018   CL 117 (H) 03/11/2018   CO2 19 (L) 03/11/2018   GLUCOSE 130 (H) 03/11/2018   BUN 15 03/11/2018   CREATININE 1.08 03/11/2018   CALCIUM 7.8 (L) 03/11/2018   GFRNONAA >60 03/11/2018   GFRAA >60 03/11/2018    Lab Results  Component Value Date   ESRSEDRATE 138 (H) 02/13/2018   Lab Results  Component Value Date   CRP 11.8 (H) 01/17/2018   Lab Results  Component Value Date   ESRSEDRATE 46 (H) 03/20/2018   Lab Results  Component Value Date   CRP 16.2 (H) 03/20/2018     Assessment and Plan   Completed 6 wk of IV abtx with intent of doing 4 more weeks of oral doxy to finish out course of presumed/early discitis/osteo. wil check sed rate and crp today Will give doxy x 4 wk  Serous output from intra-abdominal drain = defer to dr Ninfa Linden who he will see tomorrow.   rtc in 4 wk

## 2018-03-21 ENCOUNTER — Encounter (INDEPENDENT_AMBULATORY_CARE_PROVIDER_SITE_OTHER): Payer: Self-pay | Admitting: Physician Assistant

## 2018-03-21 LAB — C-REACTIVE PROTEIN: CRP: 16.2 mg/L — AB (ref <8.0)

## 2018-03-21 LAB — SEDIMENTATION RATE: SED RATE: 46 mm/h — AB (ref 0–20)

## 2018-03-21 NOTE — Progress Notes (Signed)
Office Visit Note   Patient: [name]           Date of Birth: 01/12/1956           MRN: [id number] Visit Date: 03/20/2018              Requested by: [name], [place] [address], [place] [address] PCP: [name], MD  Chief Complaint  Patient presents with  . Right Leg - Routine Post Op    01/17/18 right BKA       HPI: The patient is a 62 yo male with insensate diabetic neuropathy who presents for post operative follow up following right transtibial amputation 01/17/18. He previously underwent a left transtibial amputation 12/08/2014.  He is 2 months post op. He reports he is doing well with the amputation and wearing his stump shrinker stocking, and is due to see [facility] next week for prosthesis.  He reports some low back pain which hurts mainly when he is twisting or turning. He reports he is okay if he is just sitting still or lying down. He denies any numbness or tingling or weakness. No bowel or bladder symptoms. He is currently at [facility] for rehab.    Assessment & Plan: Visit Diagnoses:  1. Acquired absence of right lower extremity below knee ([place])   2. 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 Type 2 diabetes mellitus with diabetic polyneuropathy, unspecified whether long term insulin use ([place])   4. Peripheral vascular disease ([place])   5. H/O aortic valve replacement   6. Severe protein-calorie malnutrition ([place])     Plan: Will send orders to [facility] facility to try some Robaxin 500 mg QID prn for the patient's back pain. The patient is ready to proceed to [facility] next week for prosthesis fabrication. Continue therapies to mobilize at [facility].  He will follow up here in 4 weeks.   Follow-Up Instructions: Return in about 4 weeks (around 04/17/2018).   Ortho Exam  Patient is alert, oriented, no adenopathy, well-dressed, normal affect, normal  respiratory effort. The patient presents in wheelchair. Bilateral transtibial amputations, the left well healed and the right healing well, staples out and no signs of infection or cellulitis. Minimal edema. Full knee extension and good flexion.  Low back with some paraspinal tenderness and twisting at waist produces pain. No weakness.   Imaging: No results found. No images are attached to the encounter.  Labs: Lab Results  Component Value Date   HGBA1C 8.5 (H) 01/16/2018   HGBA1C 9.9 (H) 07/28/2014   HGBA1C  03/27/2009    6.1 (NOTE) The ADA recommends the following therapeutic goal for glycemic control related to Hgb A1c measurement: Goal of therapy: <6.5 Hgb A1c  Reference: American Diabetes Association: Clinical Practice Recommendations 2010, Diabetes Care, 2010, 33: (Suppl  1).   ESRSEDRATE 46 (H) 03/20/2018   ESRSEDRATE 138 (H) 02/13/2018   ESRSEDRATE 40 01/23/2018   CRP 16.2 (H) 03/20/2018   CRP 11.8 (H) 01/17/2018   CRP 15.6 (H) 01/16/2018   REPTSTATUS 03/02/2018 FINAL 02/28/2018   GRAMSTAIN  05/02/2010    RARE WBC PRESENT,BOTH PMN AND MONONUCLEAR FEW GRAM POSITIVE COCCI IN PAIRS RARE GRAM NEGATIVE RODS   GRAMSTAIN  05/02/2010    RARE WBC PRESENT,BOTH PMN AND MONONUCLEAR FEW GRAM POSITIVE COCCI IN PAIRS RARE GRAM NEGATIVE RODS   CULT  02/28/2018    NO GROWTH Performed at [facility],  [address], [place], [place] [address]    LABORGA STAPHYLOCOCCUS AUREUS 01/14/2018     Lab Results  Component Value Date   ALBUMIN 1.8 (L) 03/11/2018   ALBUMIN 1.7 (L) 03/10/2018   ALBUMIN 1.7 (L) 03/09/2018   PREALBUMIN 16.6 (L) 05/09/2009   PREALBUMIN 12.2 (L) 05/02/2009   PREALBUMIN 7.4 (L) 04/25/2009    Body mass index is 33.15 kg/m.  Orders:  No orders of the defined types were placed in this encounter.  No orders of the defined types were placed in this encounter.    Procedures: No procedures performed  Clinical Data: No additional  findings.  ROS:  All other systems negative, except as noted in the HPI. Review of Systems  Objective: Vital Signs: Ht 5\' 10"  (1.778 m)   Wt 231 lb (104.8 kg)   BMI 33.15 kg/m   Specialty Comments:  No specialty comments available.  PMFS History: Patient Active Problem List   Diagnosis Date Noted  . Bile leak, postoperative   . Acute gangrenous cholecystitis s/p lap cholecystectomy 02/28/2018 03/02/2018  . AKI (acute kidney injury) ([place])   . Acute hypoxemic respiratory failure ([place]) 02/06/2018  . PAF (paroxysmal atrial fibrillation) ([place]) 02/05/2018  . Aortic atherosclerosis ([place]) 02/05/2018  . Acute renal failure (ARF) ([place]) 01/27/2018  . Hyponatremia 01/27/2018  . 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 S/P bilateral BKA (below knee amputation) ([place])   . Post-operative pain   . Benign essential HTN   . Diabetes mellitus type 2 in obese ([place])   . PVD (peripheral vascular disease) ([place])   . History of prosthetic mitral valve   . Right foot infection 01/15/2018  . Sepsis ([place]) 01/15/2018  . Prosthetic valve endocarditis ([place])   . Staphylococcus aureus bacteremia   . Staphylococcal arthritis of right wrist ([place])   . Acute hematogenous osteomyelitis, right ankle and foot ([place])   . Splenic infarct 01/14/2018  . Renal insufficiency 01/14/2018  . Severe sepsis ([place]) 01/14/2018  . Dupuytren's disease of palm of both hands 03/18/2017  . Cellulitis of left lower extremity 04/26/2016  . S/P BKA (below knee amputation) unilateral, right ([place]) 12/08/2014  . Bicuspid aortic valve   . Hyperlipidemia   . Hypertension   . Greater trochanter fracture ([place]) 07/28/2014  . Hip fracture, right ([place]) 07/28/2014  . HLD (hyperlipidemia) 07/28/2014  . GERD (gastroesophageal reflux disease) 07/28/2014  . Hip fracture ([place]) 07/28/2014  . Fall 07/28/2014  . Leukocytosis 07/28/2014  . Thrombocytopenia ([place]) 07/28/2014  . H/O aortic valve replacement 10/16/2011  . CAD, AUTOLOGOUS  BYPASS GRAFT 05/25/2009  . PLEURAL EFFUSION 05/25/2009  . OPEN WOUND OF CHEST , COMPLICATED 60/02/9322  . PROSTHETIC VALVE - BIO/ ENDOPROSTHESIS 05/25/2009  . Insulin-requiring or dependent type II diabetes mellitus ([place]) 04/19/2009  . HYPERLIPIDEMIA 04/19/2009  . Essential hypertension 04/19/2009  . CAD (coronary artery disease) 04/19/2009  . Peripheral vascular disease ([place]) 04/19/2009  . OSTEOMYELITIS 04/19/2009  . BICUSPID AORTIC VALVE 04/19/2009   Past Medical History:  Diagnosis Date  . Bicuspid aortic valve    a. 02/2009 s/p tissue AVR;  b. 07/2014 Echo: EF 55-60%, basal-mid inf HK, mild AS/MR, mod dil LA, PASP 44 mmHg.  Marland Kitchen CAD (coronary artery disease) October 2010   a. 02/2009 NSTEMI/Cath: 3VD; b. 02/2009 CABG x 5: LIMA->LAD, VG->Diag, VG->RI, VG->RVM->RPDA.  Marland Kitchen GERD (gastroesophageal reflux disease)   . Heart murmur   . Hyperlipidemia   . Hypertension   . Myocardial infarction ([place]) 2010  . PVD (peripheral vascular  disease) ([place])    a. s/p toe amputations on L foot.  . Type II diabetes mellitus ([place])     Family History  Problem Relation Age of Onset  . Heart attack Father        early age  . Heart disease Father   . Stroke Father   . Heart attack Brother        At age 55  . Stroke Brother   . Diabetes type II Mother   . Diabetes type II Sister   . Diabetes Unknown   . Coronary artery disease Unknown     Past Surgical History:  Procedure Laterality Date  . AMPUTATION Left 2011   great and second toes  . AMPUTATION  07/27/2011   Procedure: AMPUTATION FOOT;  Surgeon: Newt Minion, MD;  Location: [facility];  Service: Orthopedics;  Laterality: Left;  Left Midfoot Amputation   . AMPUTATION Left 12/08/2014   Procedure: Left Below Knee Amputation;  Surgeon: Newt Minion, MD;  Location: [facility];  Service: Orthopedics;  Laterality: Left;  . AMPUTATION Right 01/17/2018   Procedure: RIGHT BELOW KNEE AMPUTATION;  Surgeon: Newt Minion, MD;  Location: [facility];  Service:  Orthopedics;  Laterality: Right;  . AMPUTATION TOE Right    "all my toes on my right foot have been amputated"  . AORTIC VALVE REPLACEMENT  2010   Pericardial tissue valve  . BILIARY STENT PLACEMENT  03/06/2018   Procedure: BILIARY STENT PLACEMENT;  Surgeon: Irene Shipper, MD;  Location: [facility] ENDOSCOPY;  Service: Endoscopy;;  . CARDIAC CATHETERIZATION  2010  . CARDIAC VALVE REPLACEMENT    . CATARACT EXTRACTION W/ INTRAOCULAR LENS  IMPLANT, BILATERAL Bilateral   . CHOLECYSTECTOMY N/A 02/28/2018   Procedure: LAPAROSCOPIC CHOLECYSTECTOMY;  Surgeon: Coralie Keens, MD;  Location: [facility];  Service: General;  Laterality: N/A;  . CORONARY ARTERY BYPASS GRAFT  2010   "CABG X5"  . ERCP N/A 03/06/2018   Procedure: ENDOSCOPIC RETROGRADE CHOLANGIOPANCREATOGRAPHY (ERCP);  Surgeon: Irene Shipper, MD;  Location: [facility] ENDOSCOPY;  Service: Endoscopy;  Laterality: N/A;  . IR FLUORO GUIDE CV LINE RIGHT  02/13/2018  . IR REMOVAL TUN CV CATH W/O FL  02/24/2018  . IR US GUIDE VASC ACCESS RIGHT  02/13/2018  . REMOVAL OF STONES  03/06/2018   Procedure: REMOVAL OF STONES;  Surgeon: Irene Shipper, MD;  [facility];  Service: Endoscopy;;  . Joan Mayans  03/06/2018   Procedure: SPHINCTEROTOMY;  Surgeon: Irene Shipper, MD;  Location: [facility];  Service: Endoscopy;;  . TEE WITHOUT CARDIOVERSION N/A 01/21/2018   Procedure: TRANSESOPHAGEAL ECHOCARDIOGRAM (TEE);  Surgeon: Buford Dresser, MD;  Location: [facility] ENDOSCOPY;  Service: Cardiovascular;  Laterality: N/A;   Social History   Occupational History  . Occupation: Publishing rights manager: [employer]  Tobacco Use  . Smoking status: Former Smoker    Packs/day: 1.00    Years: 24.00    Pack years: 24.00    Types: Cigarettes    Last attempt to quit: 10/15/2000    Years since quitting: 17.4  . Smokeless tobacco: Never Used  Substance and Sexual Activity  . Alcohol use: Yes    Comment: 01/15/2018 "1-2 drinks/month"  . Drug  use: No  . Sexual activity: Not Currently

## 2018-04-08 ENCOUNTER — Ambulatory Visit (INDEPENDENT_AMBULATORY_CARE_PROVIDER_SITE_OTHER): Payer: Medicare Other | Admitting: Gastroenterology

## 2018-04-08 ENCOUNTER — Encounter: Payer: Self-pay | Admitting: Gastroenterology

## 2018-04-08 VITALS — BP 128/72 | HR 78 | Ht 70.0 in | Wt 185.0 lb

## 2018-04-08 DIAGNOSIS — K839 Disease of biliary tract, unspecified: Secondary | ICD-10-CM

## 2018-04-08 DIAGNOSIS — Z7901 Long term (current) use of anticoagulants: Secondary | ICD-10-CM | POA: Diagnosis not present

## 2018-04-08 DIAGNOSIS — R194 Change in bowel habit: Secondary | ICD-10-CM | POA: Diagnosis not present

## 2018-04-08 NOTE — Progress Notes (Signed)
HPI :  62 y/o male with multiple comorbidities - h/o MSSA bacteremia with splenic infarct and tricuspid valve endocarditis complicated by acute renal failure, course complicated by cholecystitis. He was s/p lap chole in October with removal of necrotic and gangrenous gallbladder. Readmitted with high output biliary drainage. He had a negative HIDA scan for bile leak but given high clinic suspicion for leak he had an ERCP with findings of a cystic duct leak s/p stent placement on 10/31 per Dr. Henrene Pastor.  It was recommended he have the stent pulled in 6-8 weeks. Of note imaging had suggested a T12-L1 discitis / osteomyelitis for which he has been followed by ID and given antibiotics with daptomycin through November 7th and then transitioned to oral doxycycline. His surgeon is Dr. Rush Farmer who he has been following with given surgical drain remains in place. He continues to take coumadin.   Generally he is doing much better since I saw him in the hospital. He denies any abdominal pains that bother him, other than where the drain is in place he feels some tugging at times. He has a change once daily there is serous fluid in the drain. He is pending appointment with Dr. Ninfa Linden to follow up with surgery. He reports is had some looser stools since having his gallbladder removed, has on average of one bowel movement per day though. He is no longer on dialysis and his kidney function is significantly improved. He last had an echocardiogram in September showing EF of 50-55%. He otherwise denies any complaints or cardiopulmonary symptoms at this time.    Past Medical History:  Diagnosis Date  . Bicuspid aortic valve    a. 02/2009 s/p tissue AVR;  b. 07/2014 Echo: EF 55-60%, basal-mid inf HK, mild AS/MR, mod dil LA, PASP 44 mmHg.  Marland Kitchen CAD (coronary artery disease) October 2010   a. 02/2009 NSTEMI/Cath: 3VD; b. 02/2009 CABG x 5: LIMA->LAD, VG->Diag, VG->RI, VG->RVM->RPDA.  Marland Kitchen GERD (gastroesophageal reflux disease)     . Heart murmur   . Hyperlipidemia   . Hypertension   . Myocardial infarction ([place]) 2010  . PVD (peripheral vascular disease) ([place])    a. s/p toe amputations on L foot.  . Type II diabetes mellitus ([place])      Past Surgical History:  Procedure Laterality Date  . AMPUTATION Left 2011   great and second toes  . AMPUTATION  07/27/2011   Procedure: AMPUTATION FOOT;  Surgeon: Newt Minion, MD;  Location: [place];  Service: Orthopedics;  Laterality: Left;  Left Midfoot Amputation   . AMPUTATION Left 12/08/2014   Procedure: Left Below Knee Amputation;  Surgeon: Newt Minion, MD;  Location: [place];  Service: Orthopedics;  Laterality: Left;  . AMPUTATION Right 01/17/2018   Procedure: RIGHT BELOW KNEE AMPUTATION;  Surgeon: Newt Minion, MD;  Location: [place];  Service: Orthopedics;  Laterality: Right;  . AMPUTATION TOE Right    "all my toes on my right foot have been amputated"  . AORTIC VALVE REPLACEMENT  2010   Pericardial tissue valve  . BILIARY STENT PLACEMENT  03/06/2018   Procedure: BILIARY STENT PLACEMENT;  Surgeon: Irene Shipper, MD;  Location: [facility] ENDOSCOPY;  Service: Endoscopy;;  . CARDIAC CATHETERIZATION  2010  . CARDIAC VALVE REPLACEMENT    . CATARACT EXTRACTION W/ INTRAOCULAR LENS  IMPLANT, BILATERAL Bilateral   . CHOLECYSTECTOMY N/A 02/28/2018   Procedure: LAPAROSCOPIC CHOLECYSTECTOMY;  Surgeon: Coralie Keens, MD;  Location: [place];  Service: General;  Laterality: N/A;  .  CORONARY ARTERY BYPASS GRAFT  2010   "CABG X5"  . ERCP N/A 03/06/2018   Procedure: ENDOSCOPIC RETROGRADE CHOLANGIOPANCREATOGRAPHY (ERCP);  Surgeon: Irene Shipper, MD;  Location: [facility] ENDOSCOPY;  Service: Endoscopy;  Laterality: N/A;  . IR FLUORO GUIDE CV LINE RIGHT  02/13/2018  . IR REMOVAL TUN CV CATH W/O FL  02/24/2018  . IR US GUIDE VASC ACCESS RIGHT  02/13/2018  . REMOVAL OF STONES  03/06/2018   Procedure: REMOVAL OF STONES;  Surgeon: Irene Shipper, MD;  Location: [facility] END[facility]COPY;  Service: Endoscopy;;  .  Joan Mayans  03/06/2018   Procedure: SPHINCTEROTOMY;  Surgeon: Irene Shipper, MD;  Location: [place];  Service: Endoscopy;;  . TEE WITHOUT CARDIOVERSION N/A 01/21/2018   Procedure: TRANSESOPHAGEAL ECHOCARDIOGRAM (TEE);  Surgeon: Buford Dresser, MD;  Location: [facility] ENDOSCOPY;  Service: Cardiovascular;  Laterality: N/A;   Family History  Problem Relation Age of Onset  . Heart attack Father        early age  . Heart disease Father   . Stroke Father   . Heart attack Brother        At age 12  . Stroke Brother   . Diabetes type II Mother   . Diabetes type II Sister   . Diabetes Unknown   . Coronary artery disease Unknown   . Colon cancer Neg Hx   . Esophageal cancer Neg Hx    Social History   Tobacco Use  . Smoking status: Former Smoker    Packs/day: 1.00    Years: 24.00    Pack years: 24.00    Types: Cigarettes    Last attempt to quit: 10/15/2000    Years since quitting: 17.4  . Smokeless tobacco: Never Used  Substance Use Topics  . Alcohol use: Yes    Comment: 01/15/2018 "1-2 drinks/month"  . Drug use: No   Current Outpatient Medications  Medication Sig Dispense Refill  . methocarbamol (ROBAXIN) 500 MG tablet Take 500 mg by mouth 4 (four) times daily as needed for muscle spasms.    Marland Kitchen warfarin (COUMADIN) 2 MG tablet Take 1 tablet (2 mg total) by mouth daily at 6 PM. (Patient taking differently: Take 2 mg by mouth daily as needed (monday, Wednesday and Friday). )    . warfarin (COUMADIN) 4 MG tablet Take 4 mg by mouth as needed (Tuesday and thursday).    Marland Kitchen aspirin 325 MG tablet Take 325 mg by mouth daily.      . bisacodyl (DULCOLAX) 10 MG suppository Place 10 mg rectally once as needed (for constipation not relieved by Milk of Magnesia).     . Calcium Citrate-Vitamin D (CALCIUM CITRATE + D3) 250-200 MG-UNIT TABS Take 1 tablet by mouth 2 (two) times daily.    . cetirizine (ZYRTEC) 10 MG tablet Take 10 mg by mouth daily.    . daptomycin (CUBICIN) IVPB Inject 850 mg  into the vein daily. Indication:  Endocarditis/Osteo Last Day of Therapy:  03/12/18 Labs - Once weekly:  CBC/D, BMP, and CPK Labs - Every other week:  ESR and CRP (Patient not taking: Reported on 03/20/2018) 8 Units 0  . doxycycline (VIBRA-TABS) 100 MG tablet Take 1 tablet (100 mg total) by mouth 2 (two) times daily for 28 days. (Patient not taking: Reported on 03/20/2018) 56 tablet 0  . doxycycline (VIBRA-TABS) 100 MG tablet Take 1 tablet (100 mg total) by mouth every 12 (twelve) hours for 28 days. (Patient not taking: Reported on 03/20/2018) 56 tablet 0  . insulin  aspart (NOVOLOG) 100 UNIT/ML injection Inject 4 Units into the skin 3 (three) times daily with meals. 30 mL 0  . insulin aspart (NOVOLOG) 100 UNIT/ML injection Inject 0-5 Units into the skin 3 (three) times daily with meals. 30 mL 0  . insulin glargine (LANTUS) 100 UNIT/ML injection Inject 0.1 mLs (10 Units total) into the skin at bedtime. 30 mL 0  . ketoconazole (NIZORAL) 2 % cream Apply 1 application topically See admin instructions. Apply to both buttocks and "GL fold" rash two times a day    . latanoprost (XALATAN) 0.005 % ophthalmic solution Place 1 drop into both eyes at bedtime.  3  . magnesium hydroxide (MILK OF MAGNESIA) 400 MG/5ML suspension Take 30 mLs by mouth once as needed for mild constipation.    . metoprolol tartrate (LOPRESSOR) 50 MG tablet Take 1 tablet (50 mg total) by mouth 2 (two) times daily. 30 tablet 0  . Multiple Vitamin (MULTIVITAMIN) tablet Take 1 tablet by mouth daily.      Marland Kitchen nystatin (MYCOSTATIN/NYSTOP) powder Apply topically 2 (two) times daily. 15 g 0  . pantoprazole (PROTONIX) 40 MG tablet Take 40 mg by mouth daily.      . polyethylene glycol (MIRALAX / GLYCOLAX) packet Take 17 g by mouth daily as needed for mild constipation. 14 each 0  . simvastatin (ZOCOR) 20 MG tablet Take 20 mg by mouth daily at 6 PM.   98  . Sodium Phosphates (RA SALINE ENEMA) 19-7 GM/118ML ENEM Place 1 enema rectally once as needed  (for constipation not relieved by Dulcolax suppository and notify MD if no relief from enema).     No current facility-administered medications for this visit.    Allergies  Allergen Reactions  . Ace Inhibitors Cough  . Cefazolin     Possible cause of AKI, AIN     Review of Systems: All systems reviewed and negative except where noted in HPI.   Lab Results  Component Value Date   WBC 12.7 (H) 03/10/2018   HGB 9.6 (L) 03/10/2018   HCT 32.9 (L) 03/10/2018   MCV 92.7 03/10/2018   PLT 147 (L) 03/10/2018    Lab Results  Component Value Date   CREATININE 1.08 03/11/2018   BUN 15 03/11/2018   NA 140 03/11/2018   K 3.6 03/11/2018   CL 117 (H) 03/11/2018   CO2 19 (L) 03/11/2018    Lab Results  Component Value Date   ALT 14 03/08/2018   AST 44 (H) 03/08/2018   ALKPHOS 85 03/08/2018   BILITOT 0.3 03/08/2018     Physical Exam: BP 128/72   Pulse 78   Ht '5\' 10"'  (1.778 m)   Wt 185 lb (83.9 kg)   BMI 26.54 kg/m  Constitutional: Pleasant, male in no acute distress. HEENT: Normocephalic and atraumatic. Conjunctivae are normal. No scleral icterus. Neck supple.  Cardiovascular: Normal rate, regular rhythm.  Pulmonary/chest: Effort normal and breath sounds normal. No wheezing, rales or rhonchi. Abdominal: Soft, nondistended, nontender. Drain in RUQ with clear serous fluid. There are no masses palpable. No hepatomegaly. Extremities: bilateral LE amputee Lymphadenopathy: No cervical adenopathy noted. Neurological: Alert and oriented to person place and time. Skin: Skin is warm and dry. No rashes noted. Psychiatric: Normal mood and affect. Behavior is normal.   ASSESSMENT AND PLAN: 62 year old male here for reassessment of the following issues:  Bile leak / anticoagulated - noted at the cystic duct status post cholecystectomy for gangrenous gallbladder. Dr. Henrene Pastor performed an ERCP on 10/31 s/p  stent placement, he has not had any bilious output from the drain since that time.  He has need of an ERCP to reevaluate the duct about 8 weeks post placement, ensure the leak has resolved, and pull the stent if that is the case. I will discuss with my colleagues of advanced endoscopy to see when this can be done. He will likely need to hold his Coumadin for 5 days prior to the procedure and obtain clearance to do so. We will get back to him regarding timing of scheduling for this. I encourage him to follow with Dr. Ninfa Linden of surgery regarding his JP drain in regards to when that can be pulled.  Loose stools - likely a result of post cholecystectomy state. He is not a good candidate for colestid given his coumadin and doxycycline use, he can use immodium PRN for now. He agreed, symptoms are mild.  Cameron Cellar, MD [facility]

## 2018-04-08 NOTE — Patient Instructions (Signed)
If you are age 61 or older, your body mass index should be between 23-30. Your Body mass index is 26.54 kg/m. If this is out of the aforementioned range listed, please consider follow up with your Primary Care Provider.  If you are age 34 or younger, your body mass index should be between 19-25. Your Body mass index is 26.54 kg/m. If this is out of the aformentioned range listed, please consider follow up with your Primary Care Provider.   Use Imodium, 1 tablet a day, as needed for diarrhea.  We will contact you about scheduling an ERCP with stenting at the hospital.  Thank you for entrusting me with your care and for choosing [facility], Dr. [place] Cellar

## 2018-04-09 ENCOUNTER — Other Ambulatory Visit: Payer: Self-pay

## 2018-04-09 DIAGNOSIS — R194 Change in bowel habit: Secondary | ICD-10-CM

## 2018-04-09 DIAGNOSIS — K839 Disease of biliary tract, unspecified: Secondary | ICD-10-CM

## 2018-04-09 NOTE — Progress Notes (Signed)
Per Dr. Loni Muse - pt needs cbc and LFTs in next 2 weeks.  Orders are entered.  Lm for pt requesting him to go to lab and sent a letter.

## 2018-04-14 ENCOUNTER — Other Ambulatory Visit: Payer: Self-pay

## 2018-04-14 DIAGNOSIS — Z4689 Encounter for fitting and adjustment of other specified devices: Secondary | ICD-10-CM

## 2018-04-15 ENCOUNTER — Telehealth: Payer: Self-pay

## 2018-04-15 NOTE — Telephone Encounter (Signed)
Pt received letter in the mail to have labs done. Per pt he is currently at [facility] living [facility] rehab facility, states the lab orders will need to be sent to the facility. Pt does not have the phone number of the facility.

## 2018-04-16 ENCOUNTER — Ambulatory Visit: Payer: BLUE CROSS/BLUE SHIELD | Admitting: Internal Medicine

## 2018-04-16 NOTE — Telephone Encounter (Signed)
[place] at [phone]. Spoke to [employer]. Faxing orders for CBC and LFTs to [facility] at [phone]. They will draw on Friday and will fax back results next week.

## 2018-04-17 ENCOUNTER — Encounter (INDEPENDENT_AMBULATORY_CARE_PROVIDER_SITE_OTHER): Payer: Self-pay | Admitting: Orthopedic Surgery

## 2018-04-17 ENCOUNTER — Ambulatory Visit (INDEPENDENT_AMBULATORY_CARE_PROVIDER_SITE_OTHER): Payer: Medicare Other | Admitting: Orthopedic Surgery

## 2018-04-17 DIAGNOSIS — Z89511 Acquired absence of right leg below knee: Secondary | ICD-10-CM

## 2018-04-20 ENCOUNTER — Encounter (INDEPENDENT_AMBULATORY_CARE_PROVIDER_SITE_OTHER): Payer: Self-pay | Admitting: Orthopedic Surgery

## 2018-04-20 NOTE — Progress Notes (Signed)
Office Visit Note   Patient: [name]           Date of Birth: [birth year]           MRN: [id number] Visit Date: 04/17/2018              Requested by: [name], [place] [address], [place] [address] PCP: [name], MD  Chief Complaint  Patient presents with  . 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  He has been fit for his prosthesis from [employer].  He is currently living at the [facility].  He ambulates in a wheelchair.  Assessment & Plan: Visit Diagnoses:  1. Acquired absence of right lower extremity below knee [facility])     Plan: Patient will follow-up with [facility] for prosthetic fitting will start rehab with his new prosthesis.  Follow-Up Instructions: Return in about 2 months (around 06/18/2018).   Ortho Exam  Patient is alert, oriented, no adenopathy, well-dressed, normal affect, normal respiratory effort. Examination the incision is well-healed the limb is well consolidated there is no swelling no cellulitis no signs of infection.  Imaging: No results found. No images are attached to the encounter.  Labs: Lab Results  Component Value Date   HGBA1C 8.5 (H) 01/16/2018   HGBA1C 9.9 (H) 07/28/2014   HGBA1C  03/27/2009    6.1 (NOTE) The ADA recommends the following therapeutic goal for glycemic control related to Hgb A1c measurement: Goal of therapy: <6.5 Hgb A1c  Reference: American Diabetes Association: Clinical Practice Recommendations 2010, Diabetes Care, 2010, 33: (Suppl  1).   ESRSEDRATE 46 (H) 03/20/2018   ESRSEDRATE 138 (H) 02/13/2018   ESRSEDRATE 40 01/23/2018   CRP 16.2 (H) 03/20/2018   CRP 11.8 (H) 01/17/2018   CRP 15.6 (H) 01/16/2018   REPTSTATUS 03/02/2018 FINAL 02/28/2018   GRAMSTAIN  05/02/2010    RARE WBC PRESENT,BOTH PMN AND MONONUCLEAR FEW GRAM POSITIVE COCCI IN PAIRS RARE GRAM  NEGATIVE RODS   GRAMSTAIN  05/02/2010    RARE WBC PRESENT,BOTH PMN AND MONONUCLEAR FEW GRAM POSITIVE COCCI IN PAIRS RARE GRAM NEGATIVE RODS   CULT  02/28/2018    NO GROWTH Performed at [facility], [place] [address] [place], [place] [address]    [place] 01/14/2018     Lab Results  Component Value Date   ALBUMIN 1.8 (L) 03/11/2018   ALBUMIN 1.7 (L) 03/10/2018   ALBUMIN 1.7 (L) 03/09/2018   PREALBUMIN 16.6 (L) 05/09/2009   PREALBUMIN 12.2 (L) 05/02/2009   PREALBUMIN 7.4 (L) 04/25/2009    There is no height or weight on file to calculate BMI.  Orders:  No orders of the defined types were placed in this encounter.  No orders of the defined types were placed in this encounter.    Procedures: No procedures performed  Clinical Data: No additional findings.  ROS:  All other systems negative, except as noted in the HPI. Review of Systems  Objective: Vital Signs: There were no vitals taken for this visit.  Specialty Comments:  No specialty comments available.  PMFS History: Patient Active Problem List   Diagnosis Date Noted  . Bile leak, postoperative   . Acute gangrenous cholecystitis s/p lap cholecystectomy 02/28/2018 03/02/2018  . AKI (acute kidney injury) ([place])   . Acute hypoxemic respiratory failure ([place]) 02/06/2018  . PAF (paroxysmal atrial fibrillation) ([place])  02/05/2018  . Aortic atherosclerosis ([place]) 02/05/2018  . Acute renal failure (ARF) ([place]) 01/27/2018  . Hyponatremia 01/27/2018  . 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 S/P bilateral BKA (below knee amputation) ([place])   . Post-operative pain   . Benign essential HTN   . Diabetes mellitus type 2 in obese ([place])   . PVD (peripheral vascular disease) ([place])   . History of prosthetic mitral valve   . Right foot infection 01/15/2018  . Sepsis ([place]) 01/15/2018  . Prosthetic valve endocarditis ([place])   . Staphylococcus aureus bacteremia   . Staphylococcal arthritis of  right wrist ([place])   . Acute hematogenous osteomyelitis, right ankle and foot ([place])   . Splenic infarct 01/14/2018  . Renal insufficiency 01/14/2018  . Severe sepsis ([place]) 01/14/2018  . Dupuytren's disease of palm of both hands 03/18/2017  . Cellulitis of left lower extremity 04/26/2016  . S/P BKA (below knee amputation) unilateral, right ([place]) 12/08/2014  . Bicuspid aortic valve   . Hyperlipidemia   . Hypertension   . Greater trochanter fracture ([place]) 07/28/2014  . Hip fracture, right ([place]) 07/28/2014  . HLD (hyperlipidemia) 07/28/2014  . GERD (gastroesophageal reflux disease) 07/28/2014  . Hip fracture ([place]) 07/28/2014  . Fall 07/28/2014  . Leukocytosis 07/28/2014  . Thrombocytopenia ([place]) 07/28/2014  . H/O aortic valve replacement 10/16/2011  . 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 PLEURAL EFFUSION 05/25/2009  . OPEN WOUND OF CHEST , COMPLICATED 42/70/6237  . PROSTHETIC VALVE - BIO/ ENDOPROSTHESIS 05/25/2009  . Insulin-requiring or dependent type II diabetes mellitus ([place]) 04/19/2009  . HYPERLIPIDEMIA 04/19/2009  . Essential hypertension 04/19/2009  . CAD (coronary artery disease) 04/19/2009  . Peripheral vascular disease ([place]) 04/19/2009  . OSTEOMYELITIS 04/19/2009  . BICUSPID AORTIC VALVE 04/19/2009   Past Medical History:  Diagnosis Date  . 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 Acute and subacute infective endocarditis   . Bicuspid aortic valve    a. 02/2009 s/p tissue AVR;  b. 07/2014 Echo: EF 55-60%, basal-mid inf HK, mild AS/MR, mod dil LA, PASP 44 mmHg.  Marland Kitchen CAD (coronary artery disease) October 2010   a. 02/2009 NSTEMI/Cath: 3VD; b. 02/2009 CABG x 5: LIMA->LAD, VG->Diag, VG->RI, VG->RVM->RPDA.  Marland Kitchen Chronic kidney disease    stage 5  . Gangrene of gallbladder in cholecystitis   . GERD (gastroesophageal reflux disease)   . Heart murmur   . Hyperlipidemia   . Hypertension   . Myocardial infarction ([place]) 2010  . PVD (peripheral vascular  disease) ([place])    a. s/p toe amputations on L foot.  . Sepsis due to methicillin susceptible Staphylococcus aureus ([place])   . Type II diabetes mellitus ([facility])     Family History  Problem Relation Age of Onset  . Heart attack Father        early age  . Heart disease Father   . Stroke Father   . Heart attack Brother        At age 57  . Stroke Brother   . Diabetes type II Mother   . Diabetes type II Sister   . Diabetes Unknown   . Coronary artery disease Unknown   . Colon cancer Neg Hx   . Esophageal cancer Neg Hx     Past Surgical History:  Procedure Laterality Date  . AMPUTATION Left 2011   great and second toes  . AMPUTATION  07/27/2011   Procedure: AMPUTATION FOOT;  Surgeon: Newt Minion, MD;  Location: [place];  Service: Orthopedics;  Laterality: Left;  Left Midfoot Amputation   . AMPUTATION Left 12/08/2014   Procedure: Left Below Knee Amputation;  Surgeon: Newt Minion, MD;  Location: [place];  Service: Orthopedics;  Laterality: Left;  . AMPUTATION Right 01/17/2018   Procedure: RIGHT BELOW KNEE AMPUTATION;  Surgeon: Newt Minion, MD;  Location: [place];  Service: Orthopedics;  Laterality: Right;  . AMPUTATION TOE Right    "all my toes on my right foot have been amputated"  . AORTIC VALVE REPLACEMENT  2010   Pericardial tissue valve  . BILIARY STENT PLACEMENT  03/06/2018   Procedure: BILIARY STENT PLACEMENT;  Surgeon: Irene Shipper, MD;  Location: [facility] ENDOSCOPY;  Service: Endoscopy;;  . CARDIAC CATHETERIZATION  2010  . CARDIAC VALVE REPLACEMENT    . CATARACT EXTRACTION W/ INTRAOCULAR LENS  IMPLANT, BILATERAL Bilateral   . CHOLECYSTECTOMY N/A 02/28/2018   Procedure: LAPAROSCOPIC CHOLECYSTECTOMY;  Surgeon: Coralie Keens, MD;  Location: [place];  Service: General;  Laterality: N/A;  . CORONARY ARTERY BYPASS GRAFT  2010   "CABG X5"  . ERCP N/A 03/06/2018   Procedure: ENDOSCOPIC RETROGRADE CHOLANGIOPANCREATOGRAPHY (ERCP);  Surgeon: Irene Shipper, MD;  Location: [facility] ENDOSCOPY;   Service: Endoscopy;  Laterality: N/A;  . IR FLUORO GUIDE CV LINE RIGHT  02/13/2018  . IR REMOVAL TUN CV CATH W/O FL  02/24/2018  . IR US GUIDE VASC ACCESS RIGHT  02/13/2018  . REMOVAL OF STONES  03/06/2018   Procedure: REMOVAL OF STONES;  Surgeon: Irene Shipper, MD;  Location: [facility] ENDOSCOPY;  Service: Endoscopy;;  . Joan Mayans  03/06/2018   Procedure: SPHINCTEROTOMY;  Surgeon: Irene Shipper, MD;  Location: [place];  Service: Endoscopy;;  . TEE WITHOUT CARDIOVERSION N/A 01/21/2018   Procedure: TRANSESOPHAGEAL ECHOCARDIOGRAM (TEE);  Surgeon: Buford Dresser, MD;  Location: [facility] ENDOSCOPY;  Service: Cardiovascular;  Laterality: N/A;   Social History   Occupational History  . Occupation: Publishing rights manager: [employer]  Tobacco Use  . Smoking status: Former Smoker    Packs/day: 1.00    Years: 24.00    Pack years: 24.00    Types: Cigarettes    Last attempt to quit: 10/15/2000    Years since quitting: 17.5  . Smokeless tobacco: Never Used  Substance and Sexual Activity  . Alcohol use: Yes    Comment: 01/15/2018 "1-2 drinks/month"  . Drug use: No  . Sexual activity: Not Currently

## 2018-05-09 ENCOUNTER — Telehealth: Payer: Self-pay

## 2018-05-09 NOTE — Telephone Encounter (Signed)
Spoke with Nurse Maudie Mercury at [facility] and she is aware of appt for ERCP at [facility] 05/26/18@12 :15pm. Pt to arrive there at 10:45am. Pt to be NPO after midnight and hold coumadin for 5 days prior to procedure. Kim to let pt know regarding holding coumadin also.

## 2018-05-09 NOTE — Telephone Encounter (Signed)
----- Message from Irene Shipper, MD sent at 05/09/2018 10:53 AM EST ----- Regarding: RE: Question 5 to be safe. Thanks  ----- Message ----- From: Algernon Huxley, RN Sent: 05/09/2018  10:41 AM EST To: Irene Shipper, MD Subject: FW: Question                                   Please see note below from Dr. Percival Spanish. How many days to you want pt to hold coumadin prior to ERCP? Please advise.  ----- Message ----- From: Minus Breeding, MD Sent: 04/20/2018  11:06 AM EST To: Algernon Huxley, RN Subject: RE: Question                                   OK to hold warfarin as needed for the procedure without bridging anticoagulation.    ----- Message ----- From: Algernon Huxley, RN Sent: 04/17/2018   1:48 PM EST To: Minus Breeding, MD Subject: FW: Question                                   Dr. Percival Spanish,  Please see the string of messages below. This patient has been scheduled for an ERCP with Dr. Henrene Pastor to have a biliary stent removed. He is now on coumadin. He is scheduled for the ERCP 05/26/18 and we need to know if the patient may hold his coumadin for 3-5 days prior to the procedure. Please advise.  Thanks so much, Rosanne Sack RN ----- Message ----- From: Yetta Flock, MD Sent: 04/16/2018  12:48 PM EST To: Algernon Huxley, RN Subject: RE: Question                                   Okay let me know if Hochrein declines. He may want to see the patient in clinic first. Thanks  ----- Message ----- From: Algernon Huxley, RN Sent: 04/16/2018   8:41 AM EST To: Yetta Flock, MD Subject: RE: Question                                   Patient told me yesterday that his PCP retired. Pt is currently at a rehab facility and hopes to be home by 05/26/18. I guess I will contact Hochrein.  ----- Message ----- From: Yetta Flock, MD Sent: 04/15/2018   9:36 PM EST To: Algernon Huxley, RN Subject: RE: Question                                   Christie Beckers,  Good question. He was started on  it when in the hospital for atrial fibrillation. Hochrein is his cardiologist but not seen in a long time. May be good to ask the patient's PCP if he has seen him since that hospitalization. Can ask Hochrein otherwise if he has not followed up with anyone about it. Thanks for your help.   ----- Message ----- From: Algernon Huxley, RN Sent: 04/15/2018   2:15 PM EST To: Yetta Flock, MD Subject: Question  Dr. Havery Moros,  I have scheduled this pt with Dr. Henrene Pastor at [facility] 05/26/18 for ERCP/stent removal. He is on coumadin. Pt states it was started when he was in the hospital. Do you know who I need to contact about holding it for 3-5 days prior to ERCP? Pt states he has not see Dr. Jenkins Rouge in a long time.  Please help if you can, Thanks, Vaughan Basta

## 2018-05-26 ENCOUNTER — Other Ambulatory Visit: Payer: Self-pay

## 2018-05-26 ENCOUNTER — Encounter (HOSPITAL_COMMUNITY)
Admission: RE | Disposition: A | Discharge status: Home / Self Care | Payer: Self-pay | Source: Home / Self Care | Attending: Internal Medicine

## 2018-05-26 ENCOUNTER — Ambulatory Visit (HOSPITAL_COMMUNITY): Payer: Medicare Other

## 2018-05-26 ENCOUNTER — Ambulatory Visit (HOSPITAL_COMMUNITY)
Admission: RE | Admit: 2018-05-26 | Discharge: 2018-05-26 | Disposition: A | Discharge status: Home / Self Care | Payer: Medicare Other | Attending: Internal Medicine | Admitting: Internal Medicine

## 2018-05-26 ENCOUNTER — Ambulatory Visit (HOSPITAL_COMMUNITY): Payer: Medicare Other | Admitting: Registered Nurse

## 2018-05-26 ENCOUNTER — Encounter (HOSPITAL_COMMUNITY): Payer: Self-pay | Admitting: *Deleted

## 2018-05-26 DIAGNOSIS — R011 Cardiac murmur, unspecified: Secondary | ICD-10-CM | POA: Insufficient documentation

## 2018-05-26 DIAGNOSIS — Q231 Congenital insufficiency of aortic valve: Secondary | ICD-10-CM | POA: Diagnosis not present

## 2018-05-26 DIAGNOSIS — Z89421 Acquired absence of other right toe(s): Secondary | ICD-10-CM | POA: Insufficient documentation

## 2018-05-26 DIAGNOSIS — Z888 Allergy status to other drugs, medicaments and biological substances status: Secondary | ICD-10-CM | POA: Insufficient documentation

## 2018-05-26 DIAGNOSIS — Z09 Encounter for follow-up examination after completed treatment for conditions other than malignant neoplasm: Secondary | ICD-10-CM | POA: Diagnosis not present

## 2018-05-26 DIAGNOSIS — K838 Other specified diseases of biliary tract: Secondary | ICD-10-CM | POA: Diagnosis present

## 2018-05-26 DIAGNOSIS — K219 Gastro-esophageal reflux disease without esophagitis: Secondary | ICD-10-CM | POA: Insufficient documentation

## 2018-05-26 DIAGNOSIS — Z89512 Acquired absence of left leg below knee: Secondary | ICD-10-CM | POA: Diagnosis not present

## 2018-05-26 DIAGNOSIS — E785 Hyperlipidemia, unspecified: Secondary | ICD-10-CM | POA: Diagnosis not present

## 2018-05-26 DIAGNOSIS — Z7982 Long term (current) use of aspirin: Secondary | ICD-10-CM | POA: Insufficient documentation

## 2018-05-26 DIAGNOSIS — Z881 Allergy status to other antibiotic agents status: Secondary | ICD-10-CM | POA: Insufficient documentation

## 2018-05-26 DIAGNOSIS — Z87891 Personal history of nicotine dependence: Secondary | ICD-10-CM | POA: Insufficient documentation

## 2018-05-26 DIAGNOSIS — Z89432 Acquired absence of left foot: Secondary | ICD-10-CM | POA: Diagnosis not present

## 2018-05-26 DIAGNOSIS — Z9842 Cataract extraction status, left eye: Secondary | ICD-10-CM | POA: Insufficient documentation

## 2018-05-26 DIAGNOSIS — Z952 Presence of prosthetic heart valve: Secondary | ICD-10-CM | POA: Diagnosis not present

## 2018-05-26 DIAGNOSIS — Z9841 Cataract extraction status, right eye: Secondary | ICD-10-CM | POA: Insufficient documentation

## 2018-05-26 DIAGNOSIS — I10 Essential (primary) hypertension: Secondary | ICD-10-CM | POA: Insufficient documentation

## 2018-05-26 DIAGNOSIS — Z7901 Long term (current) use of anticoagulants: Secondary | ICD-10-CM | POA: Insufficient documentation

## 2018-05-26 DIAGNOSIS — Z79899 Other long term (current) drug therapy: Secondary | ICD-10-CM | POA: Insufficient documentation

## 2018-05-26 DIAGNOSIS — I252 Old myocardial infarction: Secondary | ICD-10-CM | POA: Insufficient documentation

## 2018-05-26 DIAGNOSIS — Z951 Presence of aortocoronary bypass graft: Secondary | ICD-10-CM | POA: Diagnosis not present

## 2018-05-26 DIAGNOSIS — Z823 Family history of stroke: Secondary | ICD-10-CM | POA: Insufficient documentation

## 2018-05-26 DIAGNOSIS — E1151 Type 2 diabetes mellitus with diabetic peripheral angiopathy without gangrene: Secondary | ICD-10-CM | POA: Insufficient documentation

## 2018-05-26 DIAGNOSIS — Z89511 Acquired absence of right leg below knee: Secondary | ICD-10-CM | POA: Diagnosis not present

## 2018-05-26 DIAGNOSIS — K839 Disease of biliary tract, unspecified: Secondary | ICD-10-CM | POA: Diagnosis not present

## 2018-05-26 DIAGNOSIS — Z9049 Acquired absence of other specified parts of digestive tract: Secondary | ICD-10-CM | POA: Insufficient documentation

## 2018-05-26 DIAGNOSIS — Z4689 Encounter for fitting and adjustment of other specified devices: Secondary | ICD-10-CM

## 2018-05-26 DIAGNOSIS — I2510 Atherosclerotic heart disease of native coronary artery without angina pectoris: Secondary | ICD-10-CM | POA: Diagnosis not present

## 2018-05-26 DIAGNOSIS — Z8249 Family history of ischemic heart disease and other diseases of the circulatory system: Secondary | ICD-10-CM | POA: Diagnosis not present

## 2018-05-26 DIAGNOSIS — Z833 Family history of diabetes mellitus: Secondary | ICD-10-CM | POA: Insufficient documentation

## 2018-05-26 DIAGNOSIS — Z794 Long term (current) use of insulin: Secondary | ICD-10-CM | POA: Insufficient documentation

## 2018-05-26 HISTORY — PX: STENT REMOVAL: SHX6421

## 2018-05-26 HISTORY — PX: ERCP: SHX5425

## 2018-05-26 LAB — GLUCOSE, CAPILLARY
Glucose-Capillary: 127 mg/dL — ABNORMAL HIGH (ref 70–99)
Glucose-Capillary: 181 mg/dL — ABNORMAL HIGH (ref 70–99)
Glucose-Capillary: 201 mg/dL — ABNORMAL HIGH (ref 70–99)

## 2018-05-26 SURGERY — ERCP, WITH INTERVENTION IF INDICATED
Anesthesia: General

## 2018-05-26 MED ORDER — GLUCAGON HCL RDNA (DIAGNOSTIC) 1 MG IJ SOLR
INTRAMUSCULAR | Status: DC | PRN
  Administered 2018-05-26: .5 mg via INTRAVENOUS

## 2018-05-26 MED ORDER — CIPROFLOXACIN IN D5W 400 MG/200ML IV SOLN
400.0000 mg | Freq: Once | INTRAVENOUS | Status: AC
  Administered 2018-05-26: 400 mg via INTRAVENOUS

## 2018-05-26 MED ORDER — SODIUM CHLORIDE 0.9 % IV SOLN: INTRAVENOUS | Status: DC

## 2018-05-26 MED ORDER — INDOMETHACIN 50 MG RE SUPP
RECTAL | Status: AC
  Filled 2018-05-26: qty 2

## 2018-05-26 MED ORDER — SODIUM CHLORIDE 0.9 % IV SOLN
INTRAVENOUS | Status: DC | PRN
  Administered 2018-05-26: 30 mL

## 2018-05-26 MED ORDER — ONDANSETRON HCL 4 MG/2ML IJ SOLN
INTRAMUSCULAR | Status: DC | PRN
  Administered 2018-05-26: 4 mg via INTRAVENOUS

## 2018-05-26 MED ORDER — CIPROFLOXACIN IN D5W 400 MG/200ML IV SOLN
INTRAVENOUS | Status: AC
  Filled 2018-05-26: qty 200

## 2018-05-26 MED ORDER — ROCURONIUM BROMIDE 10 MG/ML (PF) SYRINGE
PREFILLED_SYRINGE | INTRAVENOUS | Status: DC | PRN
  Administered 2018-05-26: 40 mg via INTRAVENOUS

## 2018-05-26 MED ORDER — PROPOFOL 10 MG/ML IV BOLUS
INTRAVENOUS | Status: AC
  Filled 2018-05-26: qty 20

## 2018-05-26 MED ORDER — GLUCAGON HCL RDNA (DIAGNOSTIC) 1 MG IJ SOLR
INTRAMUSCULAR | Status: AC
  Filled 2018-05-26: qty 1

## 2018-05-26 MED ORDER — DEXAMETHASONE SODIUM PHOSPHATE 10 MG/ML IJ SOLN
INTRAMUSCULAR | Status: DC | PRN
  Administered 2018-05-26: 4 mg via INTRAVENOUS

## 2018-05-26 MED ORDER — LIDOCAINE 2% (20 MG/ML) 5 ML SYRINGE
INTRAMUSCULAR | Status: DC | PRN
  Administered 2018-05-26: 60 mg via INTRAVENOUS

## 2018-05-26 MED ORDER — FENTANYL CITRATE (PF) 100 MCG/2ML IJ SOLN
INTRAMUSCULAR | Status: AC
  Filled 2018-05-26: qty 2

## 2018-05-26 MED ORDER — PROPOFOL 10 MG/ML IV BOLUS
INTRAVENOUS | Status: DC | PRN
  Administered 2018-05-26: 110 mg via INTRAVENOUS

## 2018-05-26 MED ORDER — LACTATED RINGERS IV SOLN
INTRAVENOUS | Status: DC
  Administered 2018-05-26: 11:00:00 via INTRAVENOUS

## 2018-05-26 MED ORDER — SUGAMMADEX SODIUM 200 MG/2ML IV SOLN
INTRAVENOUS | Status: DC | PRN
  Administered 2018-05-26: 200 mg via INTRAVENOUS

## 2018-05-26 MED ORDER — INDOMETHACIN 50 MG RE SUPP
RECTAL | Status: DC | PRN
  Administered 2018-05-26: 100 mg via RECTAL

## 2018-05-26 MED ORDER — FENTANYL CITRATE (PF) 100 MCG/2ML IJ SOLN
INTRAMUSCULAR | Status: DC | PRN
  Administered 2018-05-26 (×2): 50 ug via INTRAVENOUS

## 2018-05-26 NOTE — Anesthesia Preprocedure Evaluation (Signed)
Anesthesia Evaluation  Patient identified by MRN, date of birth, ID band Patient awake    Reviewed: Allergy & Precautions, NPO status , Patient's Chart, lab work & pertinent test results  Airway Mallampati: I  TM Distance: >3 FB Neck ROM: Full    Dental   Pulmonary former smoker,    Pulmonary exam normal        Cardiovascular hypertension, + CAD, + Past MI and + CABG  Normal cardiovascular exam+ Valvular Problems/Murmurs AS      Neuro/Psych    GI/Hepatic GERD  Controlled and Medicated,  Endo/Other  diabetes, Type 2, Insulin Dependent  Renal/GU      Musculoskeletal   Abdominal   Peds  Hematology   Anesthesia Other Findings   Reproductive/Obstetrics                             Anesthesia Physical Anesthesia Plan  ASA: III  Anesthesia Plan: General   Post-op Pain Management:    Induction: Intravenous  PONV Risk Score and Plan: 2 and Ondansetron and Treatment may vary due to age or medical condition  Airway Management Planned: Oral ETT  Additional Equipment:   Intra-op Plan:   Post-operative Plan: Extubation in OR  Informed Consent: I have reviewed the patients History and Physical, chart, labs and discussed the procedure including the risks, benefits and alternatives for the proposed anesthesia with the patient or authorized representative who has indicated his/her understanding and acceptance.       Plan Discussed with: CRNA and Surgeon  Anesthesia Plan Comments:         Anesthesia Quick Evaluation

## 2018-05-26 NOTE — H&P (Signed)
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 He was s/p lap chole in October with removal of necrotic and gangrenous gallbladder. Readmitted with high output biliary drainage. He had a negative HIDA scan for bile leak but given high clinic suspicion for leak he had an ERCP with findings of a cystic duct leak s/p stent placement on 10/31 per Dr. Henrene Pastor.  It was recommended he have the stent pulled in 6-8 weeks. Of note imaging had suggested a T12-L1 discitis / osteomyelitis for which he has been followed by ID and given antibiotics with daptomycin through November 7th and then transitioned to oral doxycycline. His surgeon is Dr. Rush Farmer who he has been following with given surgical drain remains in place. He continues to take coumadin.   Generally he is doing much better since I saw him in the hospital. He denies any abdominal pains that bother him, other than where the drain is in place he feels some tugging at times. He has a change once daily there is serous fluid in the drain. He is pending appointment with Dr. Ninfa Linden to follow up with surgery. He reports is had some looser stools since having his gallbladder removed, has on average of one bowel movement per day though. He is no longer on dialysis and his kidney function is significantly improved. He last had an echocardiogram in September showing EF of 50-55%. He otherwise denies any complaints or cardiopulmonary symptoms at this time.        Past Medical History:  Diagnosis Date  . Bicuspid aortic valve    a. 02/2009 s/p tissue AVR;  b. 07/2014 Echo: EF 55-60%, basal-mid inf HK, mild AS/MR, mod dil LA, PASP 44 mmHg.  Marland Kitchen CAD (coronary artery disease) October 2010   a. 02/2009 NSTEMI/Cath: 3VD; b. 02/2009 CABG x 5: LIMA->LAD, VG->Diag, VG->RI, VG->RVM->RPDA.  Marland Kitchen GERD (gastroesophageal reflux  disease)   . Heart murmur   . Hyperlipidemia   . Hypertension   . Myocardial infarction ([place]) 2010  . PVD (peripheral vascular disease) ([place])    a. s/p toe amputations on L foot.  . Type II diabetes mellitus ([place])           Past Surgical History:  Procedure Laterality Date  . AMPUTATION Left 2011   great and second toes  . AMPUTATION  07/27/2011   Procedure: AMPUTATION FOOT;  Surgeon: Newt Minion, MD;  Location: [place];  Service: Orthopedics;  Laterality: Left;  Left Midfoot Amputation   . AMPUTATION Left 12/08/2014   Procedure: Left Below Knee Amputation;  Surgeon: Newt Minion, MD;  Location: [place];  Service: Orthopedics;  Laterality: Left;  . AMPUTATION Right 01/17/2018   Procedure: RIGHT BELOW KNEE AMPUTATION;  Surgeon: Newt Minion, MD;  Location: [place];  Service: Orthopedics;  Laterality: Right;  . AMPUTATION TOE Right    "all my toes on my right foot have been amputated"  . AORTIC VALVE REPLACEMENT  2010   Pericardial tissue valve  . BILIARY STENT PLACEMENT  03/06/2018   Procedure: BILIARY STENT PLACEMENT;  Surgeon: Irene Shipper, MD;  Location: [facility] ENDOSCOPY;  Service: Endoscopy;;  . CARDIAC CATHETERIZATION  2010  . CARDIAC VALVE REPLACEMENT    . CATARACT EXTRACTION W/ INTRAOCULAR LENS  IMPLANT, BILATERAL Bilateral   . CHOLECYSTECTOMY N/A 02/28/2018   Procedure: LAPAROSCOPIC CHOLECYSTECTOMY;  Surgeon: Coralie Keens, MD;  Location: [place];  Service: General;  Laterality: N/A;  . CORONARY ARTERY BYPASS GRAFT  2010   "CABG X5"  . ERCP N/A 03/06/2018   Procedure: ENDOSCOPIC RETROGRADE CHOLANGIOPANCREATOGRAPHY (ERCP);  Surgeon: Irene Shipper, MD;  Location[facility] ENDOSCOPY;  Service: Endoscopy;  Laterality: N/A;  . IR FLUORO GUIDE CV LINE RIGHT  02/13/2018  . IR REMOVAL TUN CV CATH W/O FL  02/24/2018  . IR US GUIDE VASC ACCESS RIGHT  02/13/2018  . REMOVAL OF STONES  03/06/2018   Procedure: REMOVAL OF STONES;  Surgeon: Irene Shipper, MD;   Location: [facility] ENDOSCOPY;  Service: Endoscopy;;  . Joan Mayans  03/06/2018   Procedure: SPHINCTEROTOMY;  Surgeon: Irene Shipper, MD;  Location: [place];  Service: Endoscopy;;  . TEE WITHOUT CARDIOVERSION N/A 01/21/2018   Procedure: TRANSESOPHAGEAL ECHOCARDIOGRAM (TEE);  Surgeon: Buford Dresser, MD;  Location: [facility] ENDOSCOPY;  Service: Cardiovascular;  Laterality: N/A;        Family History  Problem Relation Age of Onset  . Heart attack Father        early age  . Heart disease Father   . Stroke Father   . Heart attack Brother        At age 24  . Stroke Brother   . Diabetes type II Mother   . Diabetes type II Sister   . Diabetes Unknown   . Coronary artery disease Unknown   . Colon cancer Neg Hx   . Esophageal cancer Neg Hx    Social History        Tobacco Use  . Smoking status: Former Smoker    Packs/day: 1.00    Years: 24.00    Pack years: 24.00    Types: Cigarettes    Last attempt to quit: 10/15/2000    Years since quitting: 17.4  . Smokeless tobacco: Never Used  Substance Use Topics  . Alcohol use: Yes    Comment: 01/15/2018 "1-2 drinks/month"  . Drug use: No         Current Outpatient Medications  Medication Sig Dispense Refill  . methocarbamol (ROBAXIN) 500 MG tablet Take 500 mg by mouth 4 (four) times daily as needed for muscle spasms.    Marland Kitchen warfarin (COUMADIN) 2 MG tablet Take 1 tablet (2 mg total) by mouth daily at 6 PM. (Patient taking differently: Take 2 mg by mouth daily as needed (monday, Wednesday and Friday). )    . warfarin (COUMADIN) 4 MG tablet Take 4 mg by mouth as needed (Tuesday and thursday).    Marland Kitchen aspirin 325 MG tablet Take 325 mg by mouth daily.      . bisacodyl (DULCOLAX) 10 MG suppository Place 10 mg rectally once as needed (for constipation not relieved by Milk of Magnesia).     . Calcium Citrate-Vitamin D (CALCIUM CITRATE + D3) 250-200 MG-UNIT TABS Take 1 tablet by mouth 2 (two) times daily.     . cetirizine (ZYRTEC) 10 MG tablet Take 10 mg by mouth daily.    . daptomycin (CUBICIN) IVPB Inject 850 mg into the vein daily. Indication:  Endocarditis/Osteo Last Day of Therapy:  03/12/18 Labs - Once weekly:  CBC/D, BMP, and CPK Labs - Every other week:  ESR and CRP (Patient not taking: Reported on 03/20/2018) 8 Units 0  . doxycycline (VIBRA-TABS) 100 MG tablet Take 1 tablet (100 mg total) by mouth 2 (two) times daily for 28 days. (Patient not taking: Reported on 03/20/2018) 56 tablet 0  . doxycycline (VIBRA-TABS) 100 MG tablet Take 1 tablet (100  mg total) by mouth every 12 (twelve) hours for 28 days. (Patient not taking: Reported on 03/20/2018) 56 tablet 0  . insulin aspart (NOVOLOG) 100 UNIT/ML injection Inject 4 Units into the skin 3 (three) times daily with meals. 30 mL 0  . insulin aspart (NOVOLOG) 100 UNIT/ML injection Inject 0-5 Units into the skin 3 (three) times daily with meals. 30 mL 0  . insulin glargine (LANTUS) 100 UNIT/ML injection Inject 0.1 mLs (10 Units total) into the skin at bedtime. 30 mL 0  . ketoconazole (NIZORAL) 2 % cream Apply 1 application topically See admin instructions. Apply to both buttocks and "GL fold" rash two times a day    . latanoprost (XALATAN) 0.005 % ophthalmic solution Place 1 drop into both eyes at bedtime.  3  . magnesium hydroxide (MILK OF MAGNESIA) 400 MG/5ML suspension Take 30 mLs by mouth once as needed for mild constipation.    . metoprolol tartrate (LOPRESSOR) 50 MG tablet Take 1 tablet (50 mg total) by mouth 2 (two) times daily. 30 tablet 0  . Multiple Vitamin (MULTIVITAMIN) tablet Take 1 tablet by mouth daily.      Marland Kitchen nystatin (MYCOSTATIN/NYSTOP) powder Apply topically 2 (two) times daily. 15 g 0  . pantoprazole (PROTONIX) 40 MG tablet Take 40 mg by mouth daily.      . polyethylene glycol (MIRALAX / GLYCOLAX) packet Take 17 g by mouth daily as needed for mild constipation. 14 each 0  . simvastatin (ZOCOR) 20 MG tablet Take 20 mg by  mouth daily at 6 PM.   98  . Sodium Phosphates (RA SALINE ENEMA) 19-7 GM/118ML ENEM Place 1 enema rectally once as needed (for constipation not relieved by Dulcolax suppository and notify MD if no relief from enema).     No current facility-administered medications for this visit.         Allergies  Allergen Reactions  . Ace Inhibitors Cough  . Cefazolin     Possible cause of AKI, AIN     Review of Systems: All systems reviewed and negative except where noted in HPI.   RecentLabs       Lab Results  Component Value Date   WBC 12.7 (H) 03/10/2018   HGB 9.6 (L) 03/10/2018   HCT 32.9 (L) 03/10/2018   MCV 92.7 03/10/2018   PLT 147 (L) 03/10/2018      RecentLabs       Lab Results  Component Value Date   CREATININE 1.08 03/11/2018   BUN 15 03/11/2018   NA 140 03/11/2018   K 3.6 03/11/2018   CL 117 (H) 03/11/2018   CO2 19 (L) 03/11/2018      RecentLabs       Lab Results  Component Value Date   ALT 14 03/08/2018   AST 44 (H) 03/08/2018   ALKPHOS 85 03/08/2018   BILITOT 0.3 03/08/2018       Physical Exam: BP 128/72   Pulse 78   Ht '5\' 10"'  (1.778 m)   Wt 185 lb (83.9 kg)   BMI 26.54 kg/m  Constitutional: Pleasant, male in no acute distress. HEENT: Normocephalic and atraumatic. Conjunctivae are normal. No scleral icterus. Neck supple.  Cardiovascular: Normal rate, regular rhythm.  Pulmonary/chest: Effort normal and breath sounds normal. No wheezing, rales or rhonchi. Abdominal: Soft, nondistended, nontender. Drain in RUQ with clear serous fluid. There are no masses palpable. No hepatomegaly. Extremities: bilateral LE amputee Lymphadenopathy: No cervical adenopathy noted. Neurological: Alert and oriented to person place and time. Skin:  Skin is warm and dry. No rashes noted. Psychiatric: Normal mood and affect. Behavior is normal.   ASSESSMENT AND PLAN: 63 year old male here for reassessment of the following  issues:  Bile leak / anticoagulated - noted at the cystic duct status post cholecystectomy for gangrenous gallbladder. Dr. Henrene Pastor performed an ERCP on 10/31 s/p stent placement, he has not had any bilious output from the drain since that time. He has need of an ERCP to reevaluate the duct about 8 weeks post placement, ensure the leak has resolved, and pull the stent if that is the case. I will discuss with my colleagues of advanced endoscopy to see when this can be done. He will likely need to hold his Coumadin for 5 days prior to the procedure and obtain clearance to do so. We will get back to him regarding timing of scheduling for this. I encourage him to follow with Dr. Ninfa Linden of surgery regarding his JP drain in regards to when that can be pulled.  Loose stools - likely a result of post cholecystectomy state. He is not a good candidate for colestid given his coumadin and doxycycline use, he can use immodium PRN for now. He agreed, symptoms are mild.  Liberty Lake Cellar, MD [facility]  GI ATTENDING  63 year old known to me when he was hospitalized with a bile leak.  I performed ERCP March 06, 2018.  He had a cystic duct leak for which a 10 French 5 cm plastic biliary endoprosthesis was placed.  He was seen in our office by Dr. Havery Moros April 08, 2018 as outlined above.  He has had no interval problems.  He is here today with his family.  He has had his percutaneous drain removed.  Last dose of Coumadin 5 days ago.  He is now for ERCP to assess for resolution of his bile leak.The nature of the procedure, as well as the risks, benefits, and alternatives were carefully and thoroughly reviewed with the patient. Ample time for discussion and questions allowed. The patient understood, was satisfied, and agreed to proceed.  Docia Chuck. Geri Seminole., M.D. [facility] Division of Gastroenterology

## 2018-05-26 NOTE — Anesthesia Procedure Notes (Deleted)
Performed by: Porche Steinberger E, CRNA       

## 2018-05-26 NOTE — Anesthesia Procedure Notes (Addendum)
Procedure Name: Intubation Date/Time: 05/26/2018 11:34 AM Performed by: Lissa Morales, CRNA Pre-anesthesia Checklist: Patient identified, Emergency Drugs available, Suction available and Patient being monitored Patient Re-evaluated:Patient Re-evaluated prior to induction Oxygen Delivery Method: Circle system utilized Preoxygenation: Pre-oxygenation with 100% oxygen Induction Type: IV induction Ventilation: Mask ventilation without difficulty Laryngoscope Size: Mac and 4 Grade View: Grade II Tube type: Oral Number of attempts: 1 Airway Equipment and Method: Stylet and Oral airway Placement Confirmation: ETT inserted through vocal cords under direct vision,  positive ETCO2 and breath sounds checked- equal and bilateral Secured at: 21 cm Tube secured with: Tape Dental Injury: Teeth and Oropharynx as per pre-operative assessment

## 2018-05-26 NOTE — Discharge Instructions (Signed)
YOU HAD AN ENDOSCOPIC PROCEDURE TODAY: Refer to the procedure report and other information in the discharge instructions given to you for any specific questions about what was found during the examination. If this information does not answer your questions, please call [place] office at [phone] to clarify.   YOU SHOULD EXPECT: Some feelings of bloating in the abdomen. Passage of more gas than usual. Walking can help get rid of the air that was put into your GI tract during the procedure and reduce the bloating. If you had a lower endoscopy (such as a colonoscopy or flexible sigmoidoscopy) you may notice spotting of blood in your stool or on the toilet paper. Some abdominal soreness may be present for a day or two, also.  DIET: Your first meal following the procedure should be a light meal and then it is ok to progress to your normal diet. A half-sandwich or bowl of soup is an example of a good first meal. Heavy or fried foods are harder to digest and may make you feel nauseous or bloated. Drink plenty of fluids but you should avoid alcoholic beverages for 24 hours. If you had a esophageal dilation, please see attached instructions for diet.    ACTIVITY: Your care partner should take you home directly after the procedure. You should plan to take it easy, moving slowly for the rest of the day. You can resume normal activity the day after the procedure however YOU SHOULD NOT DRIVE, use power tools, machinery or perform tasks that involve climbing or major physical exertion for 24 hours (because of the sedation medicines used during the test).   SYMPTOMS TO REPORT IMMEDIATELY: A gastroenterologist can be reached at any hour. Please call [phone]  for any of the following symptoms:   Following upper endoscopy (EGD, EUS, ERCP, esophageal dilation) Vomiting of blood or coffee ground material  New, significant abdominal pain  New, significant chest pain or pain under the shoulder blades  Painful or  persistently difficult swallowing  New shortness of breath  Black, tarry-looking or red, bloody stools  FOLLOW UP:  If any biopsies were taken you will be contacted by phone or by letter within the next 1-3 weeks. Call [phone]  if you have not heard about the biopsies in 3 weeks.  Please also call with any specific questions about appointments or follow up tests.  

## 2018-05-26 NOTE — Anesthesia Postprocedure Evaluation (Signed)
Anesthesia Post Note  Patient: [name]  Procedure(s) Performed: ENDOSCOPIC RETROGRADE CHOLANGIOPANCREATOGRAPHY (ERCP) (N/A ) STENT REMOVAL     Patient location during evaluation: PACU Anesthesia Type: General Level of consciousness: awake and alert Pain management: pain level controlled Vital Signs Assessment: post-procedure vital signs reviewed and stable Respiratory status: spontaneous breathing, nonlabored ventilation, respiratory function stable and patient connected to nasal cannula oxygen Cardiovascular status: blood pressure returned to baseline and stable Postop Assessment: no apparent nausea or vomiting Anesthetic complications: no    Last Vitals:  Vitals:   05/26/18 1300 05/26/18 1320  BP: (!) 150/78 (!) 175/78  Pulse: 73   Resp: 16   Temp: 36.8 C   SpO2: 97%     Last Pain:  Vitals:   05/26/18 1300  TempSrc:   PainSc: 0-No pain                 [name] DAVID

## 2018-05-26 NOTE — Transfer of Care (Signed)
Immediate Anesthesia Transfer of Care Note  Patient: [name]  Procedure(s) Performed: ENDOSCOPIC RETROGRADE CHOLANGIOPANCREATOGRAPHY (ERCP) (N/A ) STENT REMOVAL  Patient Location: PACU  Anesthesia Type:General  Level of Consciousness: awake, alert , oriented and patient cooperative  Airway & Oxygen Therapy: Patient Spontanous Breathing and Patient connected to face mask oxygen  Post-op Assessment: Report given to RN, Post -op Vital signs reviewed and stable and Patient moving all extremities X 4  Post vital signs: stable  Last Vitals:  Vitals Value Taken Time  BP 175/78 05/26/2018  1:20 PM  Temp    Pulse 74 05/26/2018  1:22 PM  Resp 10 05/26/2018  1:22 PM  SpO2 100 % 05/26/2018  1:22 PM  Vitals shown include unvalidated device data.  Last Pain:  Vitals:   05/26/18 1300  TempSrc:   PainSc: 0-No pain         Complications: No apparent anesthesia complications

## 2018-05-26 NOTE — Op Note (Signed)
[facility] Patient Name: [name] Procedure Date: 05/26/2018 MRN: [id number] Attending MD: Docia Chuck. Henrene Pastor , MD Date of Birth: 1955-09-27 CSN: [id number] Age: 63 Admit Type: Outpatient Procedure:                ERCP with biliary stent removal Indications:              Bile leak postcholecystectomy. Status post ERCP                            with biliary stent placement March 06, 2018. Now                            for follow-up assessment Providers:                Docia Chuck. Henrene Pastor, MD, Cleda Daub, RN, William Dalton, Technician Referring MD:             Jolly Mango, MD Medicines:                General Anesthesia Complications:            No immediate complications. Estimated Blood Loss:     Estimated blood loss: none. Procedure:                Pre-Anesthesia Assessment:                           - Prior to the procedure, a History and Physical                            was performed, and patient medications and                            allergies were reviewed. The patient's tolerance of                            previous anesthesia was also reviewed. The risks                            and benefits of the procedure and the sedation                            options and risks were discussed with the patient.                            All questions were answered, and informed consent                            was obtained. Prior Anticoagulants: The patient has                            taken Coumadin (warfarin), last dose was 5 days  prior to procedure. ASA Grade Assessment: III - A                            patient with severe systemic disease. After                            reviewing the risks and benefits, the patient was                            deemed in satisfactory condition to undergo the                            procedure.                           After obtaining informed  consent, the scope was                            passed under direct vision. Throughout the                            procedure, the patient's blood pressure, pulse, and                            oxygen saturations were monitored continuously. The                            [id number]-[id number] ([id number] Olympus duodenoscope was                            introduced through the mouth, and used to inject                            contrast into and used to inject contrast into the                            bile duct. The ERCP was accomplished without                            difficulty. Scope In: Scope Out: Findings:      1. The esophagus was not examined. The stomach and duodenum were       unremarkable. Previously placed biliary stent was seen to be in good       position      2. A scout radiograph was taken with the stent in position and after       stent removal with a endoscopic snare. Previous cholecystectomy clips       noted.      3. Opacification of the biliary tree revealed unremarkable biliary tree       status post cholecystectomy. The cystic duct opacified but there was no       evidence of residual leak Impression:               1. Status post ERC with biliary stent removal.  Previous cystic duct leak has healed. Moderate Sedation:      none Recommendation:           1. Standard post procedure evaluation.                           2. Okay to discharge home if doing well after PACU                            assessment by anesthesia                           3. Return to the care of your primary physicians.                            GI follow-up as needed.                           4. Okay to resume Coumadin today Procedure Code(s):        --- Professional ---                           [id number], Endoscopic retrograde                            cholangiopancreatography (ERCP); with placement of                            endoscopic stent into biliary or  pancreatic duct,                            including pre- and post-dilation and guide wire                            passage, when performed, including sphincterotomy,                            when performed, each stent Diagnosis Code(s):        --- Professional ---                           K83.8, Other specified diseases of biliary tract CPT copyright 2018 American Medical Association. All rights reserved. The codes documented in this report are preliminary and upon coder review may  be revised to meet current compliance requirements. Docia Chuck. Henrene Pastor, MD 05/26/2018 12:16:29 PM This report has been signed electronically. Number of Addenda: 0

## 2018-05-28 ENCOUNTER — Encounter (HOSPITAL_COMMUNITY): Payer: Self-pay | Admitting: Internal Medicine

## 2018-06-18 ENCOUNTER — Encounter (INDEPENDENT_AMBULATORY_CARE_PROVIDER_SITE_OTHER): Payer: Self-pay | Admitting: Family

## 2018-06-18 ENCOUNTER — Ambulatory Visit (INDEPENDENT_AMBULATORY_CARE_PROVIDER_SITE_OTHER): Payer: Medicare Other | Admitting: Orthopedic Surgery

## 2018-06-18 ENCOUNTER — Ambulatory Visit (INDEPENDENT_AMBULATORY_CARE_PROVIDER_SITE_OTHER): Payer: Medicare Other | Admitting: Family

## 2018-06-18 VITALS — Ht 70.0 in | Wt 170.0 lb

## 2018-06-18 DIAGNOSIS — Z79899 Other long term (current) drug therapy: Secondary | ICD-10-CM | POA: Diagnosis not present

## 2018-06-18 DIAGNOSIS — Z89512 Acquired absence of left leg below knee: Secondary | ICD-10-CM | POA: Diagnosis not present

## 2018-06-18 DIAGNOSIS — Z89511 Acquired absence of right leg below knee: Secondary | ICD-10-CM

## 2018-06-18 MED ORDER — METHOCARBAMOL 500 MG PO TABS: 500.0000 mg | ORAL_TABLET | Freq: Three times a day (TID) | ORAL | 0 refills | Status: DC

## 2018-06-18 NOTE — Progress Notes (Signed)
Office Visit Note   Patient: [name]           Date of Birth: 26-Aug-1955           MRN: [id number] Visit Date: 06/18/2018              Requested by: [name] [place] [address] [place], [place] [address] PCP: Patient, No Pcp Per  Chief Complaint  Patient presents with  . Right Leg - Follow-up      HPI: Patient is a 63 year old gentleman who is status post right transtibial amputation.  He is several months out.  Is now residing with family.  Of note he is status post bilateral below the knee amputations.  Today is ambulating in bilateral prostheses with a cane in the right hand.  Assessment & Plan: Visit Diagnoses:  1. S/P BKA (below knee amputation) unilateral, right ([place])     Plan: Patient will follow-up in office as needed.  Follow-Up Instructions: Return if symptoms worsen or fail to improve.   Ortho Exam  Patient is alert, oriented, no adenopathy, well-dressed, normal affect, normal respiratory effort. Examination the incision is well-healed the limb is well consolidated there is no swelling no cellulitis no signs of infection.   Imaging: No results found. No images are attached to the encounter.  Labs: Lab Results  Component Value Date   HGBA1C 8.5 (H) 01/16/2018   HGBA1C 9.9 (H) 07/28/2014   HGBA1C  03/27/2009    6.1 (NOTE) The ADA recommends the following therapeutic goal for glycemic control related to Hgb A1c measurement: Goal of therapy: <6.5 Hgb A1c  Reference: American Diabetes Association: Clinical Practice Recommendations 2010, Diabetes Care, 2010, 33: (Suppl  1).   ESRSEDRATE 46 (H) 03/20/2018   ESRSEDRATE 138 (H) 02/13/2018   ESRSEDRATE 40 01/23/2018   CRP 16.2 (H) 03/20/2018   CRP 11.8 (H) 01/17/2018   CRP 15.6 (H) 01/16/2018   REPTSTATUS 03/02/2018 FINAL 02/28/2018   GRAMSTAIN  05/02/2010    RARE WBC PRESENT,BOTH PMN AND MONONUCLEAR FEW GRAM POSITIVE COCCI IN PAIRS RARE GRAM NEGATIVE  RODS   GRAMSTAIN  05/02/2010    RARE WBC PRESENT,BOTH PMN AND MONONUCLEAR FEW GRAM POSITIVE COCCI IN PAIRS RARE GRAM NEGATIVE RODS   CULT  02/28/2018    NO GROWTH Performed at [facility], [place] [address] [place], [place] [address]    LABORGA STAPHYLOCOCCUS AUREUS 01/14/2018     Lab Results  Component Value Date   ALBUMIN 1.8 (L) 03/11/2018   ALBUMIN 1.7 (L) 03/10/2018   ALBUMIN 1.7 (L) 03/09/2018   PREALBUMIN 16.6 (L) 05/09/2009   PREALBUMIN 12.2 (L) 05/02/2009   PREALBUMIN 7.4 (L) 04/25/2009    Body mass index is 24.39 kg/m.  Orders:  No orders of the defined types were placed in this encounter.  Meds ordered this encounter  Medications  . methocarbamol (ROBAXIN) 500 MG tablet    Sig: Take 1 tablet (500 mg total) by mouth 3 (three) times daily.    Dispense:  30 tablet    Refill:  0     Procedures: No procedures performed  Clinical Data: No additional findings.  ROS:  All other systems negative, except as noted in the HPI. Review of Systems  Constitutional: Negative for chills and fever.  Musculoskeletal: Negative for arthralgias and back pain.  Skin: Negative for wound.    Objective: Vital Signs: Ht 5\' 10"  (1.778 m)   Wt 170 lb (77.1 kg)  BMI 24.39 kg/m   Specialty Comments:  No specialty comments available.  PMFS History: Patient Active Problem List   Diagnosis Date Noted  . Encounter for removal of biliary stent   . Bile leak   . Bile leak, postoperative   . Acute gangrenous cholecystitis s/p lap cholecystectomy 02/28/2018 03/02/2018  . AKI (acute kidney injury) ([place])   . Acute hypoxemic respiratory failure ([place]) 02/06/2018  . PAF (paroxysmal atrial fibrillation) ([place]) 02/05/2018  . Aortic atherosclerosis ([place]) 02/05/2018  . Acute renal failure (ARF) ([place]) 01/27/2018  . Hyponatremia 01/27/2018  . 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 S/P bilateral BKA (below knee amputation) ([place])   . Post-operative pain   . Benign  essential HTN   . Diabetes mellitus type 2 in obese ([place])   . PVD (peripheral vascular disease) ([place])   . History of prosthetic mitral valve   . Sepsis ([place]) 01/15/2018  . Prosthetic valve endocarditis ([place])   . Staphylococcus aureus bacteremia   . Staphylococcal arthritis of right wrist ([place])   . Acute hematogenous osteomyelitis, right ankle and foot ([place])   . Splenic infarct 01/14/2018  . Renal insufficiency 01/14/2018  . Severe sepsis ([place]) 01/14/2018  . Dupuytren's disease of palm of both hands 03/18/2017  . S/P BKA (below knee amputation) unilateral, right ([place]) 12/08/2014  . Bicuspid aortic valve   . Hyperlipidemia   . Hypertension   . Greater trochanter fracture ([place]) 07/28/2014  . Hip fracture, right ([place]) 07/28/2014  . HLD (hyperlipidemia) 07/28/2014  . GERD (gastroesophageal reflux disease) 07/28/2014  . Hip fracture ([place]) 07/28/2014  . Fall 07/28/2014  . Leukocytosis 07/28/2014  . Thrombocytopenia ([place]) 07/28/2014  . H/O aortic valve replacement 10/16/2011  . 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 PLEURAL EFFUSION 05/25/2009  . OPEN WOUND OF CHEST , COMPLICATED 16/02/9603  . PROSTHETIC VALVE - BIO/ ENDOPROSTHESIS 05/25/2009  . Insulin-requiring or dependent type II diabetes mellitus ([place]) 04/19/2009  . HYPERLIPIDEMIA 04/19/2009  . Essential hypertension 04/19/2009  . CAD (coronary artery disease) 04/19/2009  . Peripheral vascular disease ([place]) 04/19/2009  . OSTEOMYELITIS 04/19/2009  . BICUSPID AORTIC VALVE 04/19/2009   Past Medical History:  Diagnosis Date  . 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 Acute and subacute infective endocarditis   . Bicuspid aortic valve    a. 02/2009 s/p tissue AVR;  b. 07/2014 Echo: EF 55-60%, basal-mid inf HK, mild AS/MR, mod dil LA, PASP 44 mmHg.  Marland Kitchen CAD (coronary artery disease) October 2010   a. 02/2009 NSTEMI/Cath: 3VD; b. 02/2009 CABG x 5: LIMA->LAD, VG->Diag, VG->RI, VG->RVM->RPDA.  . Cellulitis of  left lower extremity 04/26/2016  . Chronic kidney disease    stage 5  . Gangrene of gallbladder in cholecystitis   . GERD (gastroesophageal reflux disease)   . Heart murmur   . Hyperlipidemia   . Hypertension   . Myocardial infarction ([place]) 2010  . PVD (peripheral vascular disease) ([place])    a. s/p toe amputations on L foot.  . Right foot infection 01/15/2018  . Sepsis due to methicillin susceptible Staphylococcus aureus ([place])   . Type II diabetes mellitus (HCC)     Family History  Problem Relation Age of Onset  . Heart attack Father        early age  . Heart disease Father   . Stroke Father   . Heart attack Brother        At age 47  . Stroke Brother   .  Diabetes type II Mother   . Diabetes type II Sister   . Diabetes Other   . Coronary artery disease Other   . Colon cancer Neg Hx   . Esophageal cancer Neg Hx     Past Surgical History:  Procedure Laterality Date  . AMPUTATION Left 2011   great and second toes  . AMPUTATION  07/27/2011   Procedure: AMPUTATION FOOT;  Surgeon: Newt Minion, MD;  Location: [facility];  Service: Orthopedics;  Laterality: Left;  Left Midfoot Amputation   . AMPUTATION Left 12/08/2014   Procedure: Left Below Knee Amputation;  Surgeon: Newt Minion, MD;  Location: [facility];  Service: Orthopedics;  Laterality: Left;  . AMPUTATION Right 01/17/2018   Procedure: RIGHT BELOW KNEE AMPUTATION;  Surgeon: Newt Minion, MD;  Location: [facility];  Service: Orthopedics;  Laterality: Right;  . AMPUTATION TOE Right    "all my toes on my right foot have been amputated"  . AORTIC VALVE REPLACEMENT  2010   Pericardial tissue valve  . BILIARY STENT PLACEMENT  03/06/2018   Procedure: BILIARY STENT PLACEMENT;  Surgeon: Irene Shipper, MD;  Location[facility] ENDOSCOPY;  Service: Endoscopy;;  . CARDIAC CATHETERIZATION  2010  . CARDIAC VALVE REPLACEMENT    . CATARACT EXTRACTION W/ INTRAOCULAR LENS  IMPLANT, BILATERAL Bilateral   . CHOLECYSTECTOMY N/A 02/28/2018   Procedure:  LAPAROSCOPIC CHOLECYSTECTOMY;  Surgeon: Coralie Keens, MD;  Location: [facility];  Service: General;  Laterality: N/A;  . CORONARY ARTERY BYPASS GRAFT  2010   "CABG X5"  . ERCP N/A 03/06/2018   Procedure: ENDOSCOPIC RETROGRADE CHOLANGIOPANCREATOGRAPHY (ERCP);  Surgeon: Irene Shipper, MD;  Location: [facility] ENDOSCOPY;  Service: Endoscopy;  Laterality: N/A;  . ERCP N/A 05/26/2018   Procedure: ENDOSCOPIC RETROGRADE CHOLANGIOPANCREATOGRAPHY (ERCP);  Surgeon: Irene Shipper, MD;  Location: [facility] ENDOS[facility];  Service: Endoscopy;  Laterality: N/A;  . IR FLUORO GUIDE CV LINE RIGHT  02/13/2018  . IR REMOVAL TUN CV CATH W/O FL  02/24/2018  . IR US GUIDE VASC ACCESS RIGHT  02/13/2018  . REMOVAL OF STONES  03/06/2018   Procedure: REMOVAL OF STONES;  Surgeon: Irene Shipper, MD;  Location: [facility] ENDOSCOPY;  Service: Endoscopy;;  . Joan Mayans  03/06/2018   Procedure: SPHINCTEROTOMY;  Surgeon: Irene Shipper, MD;  Location[facility];  Service: Endoscopy;;  . Lavell Islam REMOVAL  05/26/2018   Procedure: STENT REMOVAL;  Surgeon: Irene Shipper, MD;  Location: [facility] ENDOSCOPY;  Service: Endoscopy;;  . TEE WITHOUT CARDIOVERSION N/A 01/21/2018   Procedure: TRANSESOPHAGEAL ECHOCARDIOGRAM (TEE);  Surgeon: Buford Dresser, MD;  Location: [facility] ENDOSCOPY;  Service: Cardiovascular;  Laterality: N/A;   Social History   Occupational History  . Occupation: Publishing rights manager: [employer]  Tobacco Use  . Smoking status: Former Smoker    Packs/day: 1.00    Years: 24.00    Pack years: 24.00    Types: Cigarettes    Last attempt to quit: 10/15/2000    Years since quitting: 17.6  . Smokeless tobacco: Never Used  Substance and Sexual Activity  . Alcohol use: Yes    Comment: 01/15/2018 "1-2 drinks/month"  . Drug use: No  . Sexual activity: Not Currently

## 2018-07-23 DIAGNOSIS — Z8679 Personal history of other diseases of the circulatory system: Secondary | ICD-10-CM | POA: Insufficient documentation

## 2018-07-23 DIAGNOSIS — J310 Chronic rhinitis: Secondary | ICD-10-CM | POA: Insufficient documentation

## 2018-07-23 DIAGNOSIS — E7800 Pure hypercholesterolemia, unspecified: Secondary | ICD-10-CM | POA: Insufficient documentation

## 2018-10-17 NOTE — Progress Notes (Signed)
Pt requested lab orders be faxed to [facility] lab at [phone], Att: Maudie Mercury. Faxed 04-16-18.

## 2018-10-22 ENCOUNTER — Ambulatory Visit: Payer: Self-pay

## 2018-10-22 ENCOUNTER — Other Ambulatory Visit: Payer: Self-pay

## 2018-10-22 ENCOUNTER — Ambulatory Visit: Payer: Medicare Other

## 2018-10-22 ENCOUNTER — Ambulatory Visit (INDEPENDENT_AMBULATORY_CARE_PROVIDER_SITE_OTHER): Payer: Medicare Other | Admitting: Orthopedic Surgery

## 2018-10-22 ENCOUNTER — Encounter: Payer: Self-pay | Admitting: Orthopedic Surgery

## 2018-10-22 VITALS — Ht 70.0 in | Wt 170.0 lb

## 2018-10-22 DIAGNOSIS — M76821 Posterior tibial tendinitis, right leg: Secondary | ICD-10-CM | POA: Diagnosis not present

## 2018-10-22 DIAGNOSIS — L02415 Cutaneous abscess of right lower limb: Secondary | ICD-10-CM

## 2018-10-22 DIAGNOSIS — M898X5 Other specified disorders of bone, thigh: Secondary | ICD-10-CM

## 2018-10-22 DIAGNOSIS — M79631 Pain in right forearm: Secondary | ICD-10-CM

## 2018-10-22 DIAGNOSIS — L03113 Cellulitis of right upper limb: Secondary | ICD-10-CM

## 2018-10-22 DIAGNOSIS — M25531 Pain in right wrist: Secondary | ICD-10-CM

## 2018-10-22 DIAGNOSIS — M86261 Subacute osteomyelitis, right tibia and fibula: Secondary | ICD-10-CM

## 2018-10-22 MED ORDER — DOXYCYCLINE HYCLATE 100 MG PO TABS: 100.0000 mg | ORAL_TABLET | Freq: Two times a day (BID) | ORAL | 0 refills | Status: DC

## 2018-10-22 NOTE — Progress Notes (Signed)
Office Visit Note   Patient: [name]           Date of Birth: 09-13-1955           MRN: [id number] Visit Date: 10/22/2018              Requested by: No referring provider defined for this encounter. PCP: Patient, No Pcp Per  Chief Complaint  Patient presents with  . Right Leg - Follow-up, Pain  . Left Leg - Follow-up  . Right Wrist - Pain      HPI: Patient is a 63 year old gentleman who presents with 2 separate issues #1 drainage and abscess right transtibial amputation #2 right arm cellulitis secondary to abrasion.  Assessment & Plan: Visit Diagnoses:  1. Pain of right forearm   2. Pain in right wrist   3. Pain in right femur   4. Posterior tibial tendinitis, right leg   5. Cellulitis of right arm   6. Abscess of right lower leg   7. Subacute osteomyelitis of right tibia [place])     Plan: Will need to proceed with revision of the transtibial amputation on the right secondary to osteomyelitis.  Patient may need short hospitalization in order to become independent with transfers.  He does have a transtibial amputation on the left.  The cellulitis of the right arm a arm sleeve was applied and a prescription is called in for doxycycline.  Inpatient stay should also allow [place] to maintain antibiotics to see if IV antibiotics would better resolve the cellulitis of the right arm.  Follow-Up Instructions: No follow-ups on file.   Ortho Exam  Patient is alert, oriented, no adenopathy, well-dressed, normal affect, normal respiratory effort. Examination there is cellulitis and swelling of the right wrist and forearm.  Is tender to palpation there are multiple abrasions there is no abscess.  Examination the right transtibial amputation his residual limb is thin and atrophic there is a small 3 mm wound that probes all the way down to the tibia and fibula.  There is clear serosanguineous drainage and purulence.  Radiographs shows destructive bony changes.  This appears to be more  infection that started in the tibia and has worked its way out does not appear to be a pressure wound from his prosthesis.  Imaging: Xr Forearm Right  Result Date: 10/22/2018 2 view radiographs of the right forearm and wrist shows significant calcification of the radial and ulnar arteries.  Patient has no bony abnormalities.  No fractures.  Xr Tibia/fibula Right  Result Date: 10/22/2018 2 view radiographs of the right transtibial amputation shows calcification of the popliteal vessels the knee is congruent there is destructive bony changes over the distal tibia and fibula consistent with subacute osteomyelitis.  Xr Wrist 2 Views Right  Result Date: 10/22/2018 2 view radiographs of the right forearm and wrist shows significant calcification of the radial and ulnar arteries.  Patient has no bony abnormalities.  No fractures.  No images are attached to the encounter.  Labs: Lab Results  Component Value Date   HGBA1C 8.5 (H) 01/16/2018   HGBA1C 9.9 (H) 07/28/2014   HGBA1C  03/27/2009    6.1 (NOTE) The ADA recommends the following therapeutic goal for glycemic control related to Hgb A1c measurement: Goal of therapy: <6.5 Hgb A1c  Reference: American Diabetes Association: Clinical Practice Recommendations 2010, Diabetes Care, 2010, 33: (Suppl  1).   ESRSEDRATE 46 (H) 03/20/2018   ESRSEDRATE 138 (H) 02/13/2018   ESRSEDRATE 40 01/23/2018  CRP 16.2 (H) 03/20/2018   CRP 11.8 (H) 01/17/2018   CRP 15.6 (H) 01/16/2018   REPTSTATUS 03/02/2018 FINAL 02/28/2018   GRAMSTAIN  05/02/2010    RARE WBC PRESENT,BOTH PMN AND MONONUCLEAR FEW GRAM POSITIVE COCCI IN PAIRS RARE GRAM NEGATIVE RODS   GRAMSTAIN  05/02/2010    RARE WBC PRESENT,BOTH PMN AND MONONUCLEAR FEW GRAM POSITIVE COCCI IN PAIRS RARE GRAM NEGATIVE RODS   CULT  02/28/2018    NO GROWTH Performed at [facility], [place] [address] [place], [place] [address]    LABORGA STAPHYLOCOCCUS AUREUS 01/14/2018     Lab Results   Component Value Date   ALBUMIN 1.8 (L) 03/11/2018   ALBUMIN 1.7 (L) 03/10/2018   ALBUMIN 1.7 (L) 03/09/2018   PREALBUMIN 16.6 (L) 05/09/2009   PREALBUMIN 12.2 (L) 05/02/2009   PREALBUMIN 7.4 (L) 04/25/2009    Body mass index is 24.39 kg/m.  Orders:  Orders Placed This Encounter  Procedures  . XR Wrist 2 Views Right  . XR Forearm Right  . XR FEMUR, MIN 2 VIEWS RIGHT  . XR Tibia/Fibula Right   Meds ordered this encounter  Medications  . doxycycline (VIBRA-TABS) 100 MG tablet    Sig: Take 1 tablet (100 mg total) by mouth 2 (two) times daily.    Dispense:  60 tablet    Refill:  0     Procedures: No procedures performed  Clinical Data: No additional findings.  ROS:  All other systems negative, except as noted in the HPI. Review of Systems  Objective: Vital Signs: Ht 5\' 10"  (1.778 m)   Wt 170 lb (77.1 kg)   BMI 24.39 kg/m   Specialty Comments:  No specialty comments available.  PMFS History: Patient Active Problem List   Diagnosis Date Noted  . Encounter for removal of biliary stent   . Bile leak   . Bile leak, postoperative   . Acute gangrenous cholecystitis s/p lap cholecystectomy 02/28/2018 03/02/2018  . AKI (acute kidney injury) ([place])   . Acute hypoxemic respiratory failure ([place]) 02/06/2018  . PAF (paroxysmal atrial fibrillation) ([place]) 02/05/2018  . Aortic atherosclerosis ([place]) 02/05/2018  . Acute renal failure (ARF) ([place]) 01/27/2018  . Hyponatremia 01/27/2018  . 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 S/P bilateral BKA (below knee amputation) ([place])   . Post-operative pain   . Benign essential HTN   . Diabetes mellitus type 2 in obese ([place])   . PVD (peripheral vascular disease) ([place])   . History of prosthetic mitral valve   . Sepsis ([place]) 01/15/2018  . Prosthetic valve endocarditis ([place])   . Staphylococcus aureus bacteremia   . Staphylococcal arthritis of right wrist ([place])   . Acute hematogenous osteomyelitis, right ankle and foot ([place])    . Splenic infarct 01/14/2018  . Renal insufficiency 01/14/2018  . Severe sepsis ([place]) 01/14/2018  . Dupuytren's disease of palm of both hands 03/18/2017  . S/P BKA (below knee amputation) unilateral, right ([place]) 12/08/2014  . Bicuspid aortic valve   . Hyperlipidemia   . Hypertension   . Greater trochanter fracture ([place]) 07/28/2014  . Hip fracture, right ([place]) 07/28/2014  . HLD (hyperlipidemia) 07/28/2014  . GERD (gastroesophageal reflux disease) 07/28/2014  . Hip fracture ([place]) 07/28/2014  . Fall 07/28/2014  . Leukocytosis 07/28/2014  . Thrombocytopenia ([place]) 07/28/2014  . H/O aortic valve replacement 10/16/2011  . 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 PLEURAL EFFUSION 05/25/2009  . OPEN WOUND OF CHEST , COMPLICATED 77/[id number]/4235  . PROSTHETIC VALVE -  BIO/ ENDOPROSTHESIS 05/25/2009  . Insulin-requiring or dependent type II diabetes mellitus ([place]) 04/19/2009  . HYPERLIPIDEMIA 04/19/2009  . Essential hypertension 04/19/2009  . CAD (coronary artery disease) 04/19/2009  . Peripheral vascular disease ([place]) 04/19/2009  . OSTEOMYELITIS 04/19/2009  . BICUSPID AORTIC VALVE 04/19/2009   Past Medical History:  Diagnosis Date  . 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 Acute and subacute infective endocarditis   . Bicuspid aortic valve    a. 02/2009 s/p tissue AVR;  b. 07/2014 Echo: EF 55-60%, basal-mid inf HK, mild AS/MR, mod dil LA, PASP 44 mmHg.  Marland Kitchen CAD (coronary artery disease) October 2010   a. 02/2009 NSTEMI/Cath: 3VD; b. 02/2009 CABG x 5: LIMA->LAD, VG->Diag, VG->RI, VG->RVM->RPDA.  . Cellulitis of left lower extremity 04/26/2016  . Chronic kidney disease    stage 5  . Gangrene of gallbladder in cholecystitis   . GERD (gastroesophageal reflux disease)   . Heart murmur   . Hyperlipidemia   . Hypertension   . Myocardial infarction ([place]) 2010  . PVD (peripheral vascular disease) ([place])    a. s/p toe amputations on L foot.  . Right foot infection 01/15/2018   . Sepsis due to methicillin susceptible Staphylococcus aureus ([place])   . Type II diabetes mellitus (HCC)     Family History  Problem Relation Age of Onset  . Heart attack Father        early age  . Heart disease Father   . Stroke Father   . Heart attack Brother        At age 17  . Stroke Brother   . Diabetes type II Mother   . Diabetes type II Sister   . Diabetes Other   . Coronary artery disease Other   . Colon cancer Neg Hx   . Esophageal cancer Neg Hx     Past Surgical History:  Procedure Laterality Date  . AMPUTATION Left 2011   great and second toes  . AMPUTATION  07/27/2011   Procedure: AMPUTATION FOOT;  Surgeon: Newt Minion, MD;  Location: [facility];  Service: Orthopedics;  Laterality: Left;  Left Midfoot Amputation   . AMPUTATION Left 12/08/2014   Procedure: Left Below Knee Amputation;  Surgeon: Newt Minion, MD;  Location: [facility];  Service: Orthopedics;  Laterality: Left;  . AMPUTATION Right 01/17/2018   Procedure: RIGHT BELOW KNEE AMPUTATION;  Surgeon: Newt Minion, MD;  Location: [facility];  Service: Orthopedics;  Laterality: Right;  . AMPUTATION TOE Right    "all my toes on my right foot have been amputated"  . AORTIC VALVE REPLACEMENT  2010   Pericardial tissue valve  . BILIARY STENT PLACEMENT  03/06/2018   Procedure: BILIARY STENT PLACEMENT;  Surgeon: Irene Shipper, MD;  Location: [facility] ENDOSCOPY;  Service: Endoscopy;;  . CARDIAC CATHETERIZATION  2010  . CARDIAC VALVE REPLACEMENT    . CATARACT EXTRACTION W/ INTRAOCULAR LENS  IMPLANT, BILATERAL Bilateral   . CHOLECYSTECTOMY N/A 02/28/2018   Procedure: LAPAROSCOPIC CHOLECYSTECTOMY;  Surgeon: Coralie Keens, MD;  Location: [facility];  Service: General;  Laterality: N/A;  . CORONARY ARTERY BYPASS GRAFT  2010   "CABG X5"  . ERCP N/A 03/06/2018   Procedure: ENDOSCOPIC RETROGRADE CHOLANGIOPANCREATOGRAPHY (ERCP);  Surgeon: Irene Shipper, MD;  Location: [facility] ENDOSCOPY;  Service: Endoscopy;  Laterality: N/A;  . ERCP N/A 05/26/2018    Procedure: ENDOSCOPIC RETROGRADE CHOLANGIOPANCREATOGRAPHY (ERCP);  Surgeon: Irene Shipper, MD;  Location: [facility] ENDOSCOPY;  Service: Endoscopy;  Laterality: N/A;  . IR FLUORO GUIDE CV LINE RIGHT  02/13/2018  . IR REMOVAL TUN CV CATH W/O FL  02/24/2018  . IR US GUIDE VASC ACCESS RIGHT  02/13/2018  . REMOVAL OF STONES  03/06/2018   Procedure: REMOVAL OF STONES;  Surgeon: Irene Shipper, MD;  Location: [facility] ENDOSCOPY;  Service: Endoscopy;;  . Joan Mayans  03/06/2018   Procedure: SPHINCTEROTOMY;  Surgeon: Irene Shipper, MD;  Location: [facility];  Service: Endoscopy;;  . Lavell Islam REMOVAL  05/26/2018   Procedure: STENT REMOVAL;  Surgeon: Irene Shipper, MD;  Location: [facility] ENDOSCOPY;  Service: Endoscopy;;  . TEE WITHOUT CARDIOVERSION N/A 01/21/2018   Procedure: TRANSESOPHAGEAL ECHOCARDIOGRAM (TEE);  Surgeon: Buford Dresser, MD;  Location: [facility] ENDOSCOPY;  Service: Cardiovascular;  Laterality: N/A;   Social History   Occupational History  . Occupation: Publishing rights manager: [employer]  Tobacco Use  . Smoking status: Former Smoker    Packs/day: 1.00    Years: 24.00    Pack years: 24.00    Types: Cigarettes    Quit date: 10/15/2000    Years since quitting: 18.0  . Smokeless tobacco: Never Used  Substance and Sexual Activity  . Alcohol use: Yes    Comment: 01/15/2018 "1-2 drinks/month"  . Drug use: No  . Sexual activity: Not Currently

## 2018-10-23 ENCOUNTER — Encounter (HOSPITAL_COMMUNITY): Payer: Self-pay | Admitting: Vascular Surgery

## 2018-10-23 ENCOUNTER — Other Ambulatory Visit: Payer: Self-pay

## 2018-10-23 NOTE — Progress Notes (Signed)
Patient stated that he lives 1.5 hours away from [place], instructed patient to arrive to [facility] at 530 to have a RAPID test prior to surgery tomorrow

## 2018-10-23 NOTE — Anesthesia Preprocedure Evaluation (Addendum)
Anesthesia Evaluation  Patient identified by MRN, date of birth, ID band Patient awake    Reviewed: Allergy & Precautions, NPO status , Patient's Chart, lab work & pertinent test results, reviewed documented beta blocker date and time   Airway Mallampati: III  TM Distance: >3 FB Neck ROM: Full  Mouth opening: Limited Mouth Opening  Dental no notable dental hx. (+) Dental Advisory Given, Missing, Poor Dentition,    Pulmonary neg pulmonary ROS, former smoker,    Pulmonary exam normal breath sounds clear to auscultation       Cardiovascular hypertension, Pt. on home beta blockers and Pt. on medications + CAD, + Past MI, + CABG (2010 5v CABG) and + Peripheral Vascular Disease (s/p b/l BKA)  Normal cardiovascular exam+ dysrhythmias Atrial Fibrillation + Valvular Problems/Murmurs (s/p AVR 2010) AS  Rhythm:Regular Rate:Normal  TEE 2019 EF 50-55%, bioprosthetic AV with normal function   Neuro/Psych negative neurological ROS  negative psych ROS   GI/Hepatic Neg liver ROS, GERD  Medicated and Controlled,  Endo/Other  negative endocrine ROSdiabetes, Insulin Dependent  Renal/GU Renal InsufficiencyRenal disease  negative genitourinary   Musculoskeletal negative musculoskeletal ROS (+)   Abdominal   Peds  Hematology  (+) Blood dyscrasia (on coumadin, stopped 6-8 weeks ago), ,   Anesthesia Other Findings   Reproductive/Obstetrics                           Anesthesia Physical Anesthesia Plan  ASA: IV  Anesthesia Plan: General   Post-op Pain Management:    Induction: Intravenous  PONV Risk Score and Plan: 2 and Ondansetron, Treatment may vary due to age or medical condition and Midazolam  Airway Management Planned: LMA  Additional Equipment:   Intra-op Plan:   Post-operative Plan: Extubation in OR  Informed Consent: I have reviewed the patients History and Physical, chart, labs and discussed the  procedure including the risks, benefits and alternatives for the proposed anesthesia with the patient or authorized representative who has indicated his/her understanding and acceptance.     Dental advisory given  Plan Discussed with: CRNA  Anesthesia Plan Comments: (. )      Anesthesia Quick Evaluation

## 2018-10-23 NOTE — Progress Notes (Signed)
Spoke with pt for pre-op call. Pt has hx of CAD with CABG in 2010. Pt used to see Dr. Percival Spanish but states he has not seen him in a couple of years. Pt had an episode of A-fib back in September, 2019. Pt was put on Warfarin at that time, but was taken off of it by his PCP, Dr. Lysle Pearl about 6-8 weeks ago. Pt denies any recent chest pain or sob. Pt is a type 2 Diabetic. Last A1C was 7.9 on 07/23/18. Pt states his fasting blood sugar is usually between 120-130. Pt instructed to take 1/2 of his regular dose of Lantus Insulin this evening, he will take 8 units. Instructed pt to check his blood sugar when he gets up in the AM. If blood sugar is 70 or below, treat with 1/2 cup of clear juice (apple or cranberry) and recheck blood sugar 15 minutes after drinking juice. Pt instructed that he could not have food after midnight, but may have clear liquids until 5:00 AM. Pt lived too faraway to come pick up a drink for ERAS.

## 2018-10-23 NOTE — Progress Notes (Signed)
Anesthesia Chart Review: [name]   Case: [id number] Date/Time: 10/24/18 0750   Procedure: REVISION RIGHT BELOW KNEE AMPUTATION (Right )   Anesthesia type: General   Pre-op diagnosis: Osteomyelitis Right Below Knee Amputation   Location: [facility] OR ROOM 05 / [place] [facility]   Surgeon: Newt Minion, MD      DISCUSSION: Patient is a 63 year old male scheduled for the above procedure. - Patient seen by Dr. Sharol Given on 10/22/18 for 1. Drainage and abscess right transtibial amputation 2. Right arm cellulitis secondary to abrasion Plan: Will need to proceed with revision of the transtibial amputation on the right secondary to osteomyelitis.  Patient may need short hospitalization in order to become independent with transfers.  He does have a transtibial amputation on the left.  The cellulitis of the right arm a arm sleeve was applied and a prescription is called in for doxycycline.  Inpatient stay should also allow [place] to maintain antibiotics to see if IV antibiotics would better resolve the cellulitis of the right arm.  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     - Admit 01/27/18-03/11/18 due to oliguria with acute on chronic renal failure and  acute respiratory failure. He required emergent short-term hemodialysis. Respiratory status improved. Antibiotic coverage adjusted. He also developed afib with RVR while on dialysis that converted to SR with b-blocker. Warfarin started per cardiology with plans for out-patient follow-up. On 02/25/18, he developed acute abdominal pain with N/V. CT concerning for acute cholecystitis. S/p cholecystectomy 24/82/50 complicated by biliary leak, s/p sphincterotomy/removal of stones/biliary stent placement 03/06/18 (stent removed 05/26/18).      - Admit 01/14/18-01/22/18 for Staphylococcus aureus bacteremia with sepsis due to right diabetic foot osteomyelitis (in setting of bioprosthetic aortic valve and splenic infarcts) complicated with TV endocarditis. PICC placed for IV cefazolin through 03/08/18. S/p right BKA 01/17/18. Discharged to [facility].  PAT phone RN still attempting to contact patient. Per medication list, he is no longer taking warfarin. He lives in [place], [place] (> 1 hour away) from [facility].  He is scheduled to get a rapid presurgical COVID 19 test on arrival. He will also need labs. Will plan updated EKG as well (last showed afib with RVR). According to 05/26/18 notes, he was in SR at that time. Patient reported that his PCP took him off of warfarin. Updated anesthesiologist Hoy Morn, MD. Anesthesia team to evaluate on the day of surgery.     PROVIDERS: Lysle Pearl, PA-C is PCP. First established on 07/23/18.  (See [facility]  Minus Breeding, MD is primary cardiologist--although last visit with him was on  10/16/11. He saw Dr. Irish Lack during 02/2018 hospitalization and was started on warfarin for new-onset afib/PAF.     LABS: He will need updated labs on arrival. Last lab results include: Lab Results  Component Value Date   WBC 12.7 (H) 03/10/2018   HGB  9.6 (L) 03/10/2018   HCT 32.9 (L) 03/10/2018   PLT 147 (L) 03/10/2018   GLUCOSE 130 (H) 03/11/2018    ALT 14 03/08/2018   AST 44 (H) 03/08/2018   NA 140 03/11/2018   K 3.6 03/11/2018   CL 117 (H) 03/11/2018   CREATININE 1.08 03/11/2018   BUN 15 03/11/2018   CO2 19 (L) 03/11/2018   INR 1.58 03/11/2018  A1c on 07/23/18 at [facility] was 7.9%. Non-reactive Hep C Ab on 07/23/18.    IMAGES: 1V CXR 02/10/18: IMPRESSION: 1. Interval improvement in congestive heart failure and pulmonary edema. There is still mild residual airspace edema and small effusions. No new abnormalities.   EKG: EKG 02/03/18:  Atrial fibrillation with rapid ventricular response Minimal voltage criteria for LVH, may be normal variant ST & T wave abnormality, consider lateral ischemia Abnormal ECG The patient has developed atrial fib with RVR since previous ECG Confirmed by Mertie Moores ([id number]) on 02/04/2018 8:17:27 AM  EKG 01/27/18: Sinus rhythm LVH with IVCD and secondary repol abnrm Borderline prolonged QT interval Since last tracing rate slower Confirmed by Dorie Rank [phone]) on 01/27/2018 6:19:46 PM   CV: TEE 01/21/18: Study Conclusions - Left ventricle: Systolic function was normal. The estimated   ejection fraction was in the range of 50% to 55%. Wall motion was   normal; there were no regional wall motion abnormalities. - Aortic valve: A prosthesis was present and functioning normally.   The prosthesis had a normal range of motion. The sewing ring   appeared normal, had no rocking motion, and showed no evidence of   dehiscence. - Mitral valve: Mildly calcified annulus. - Left atrium: No evidence of thrombus in the atrial cavity or   appendage. - Right atrium: No evidence of thrombus in the atrial cavity or   appendage. - Atrial septum: There was redundancy of the septum, with   borderline criteria for aneurysm. - Tricuspid valve: There was a vegetation. Impressions: - Bioprosthetic AVR does not appear to have vegetations present.   Functioning normally, no significant regurgitation. -There  is an independently mobile echodensity seen in the right   ventricle consistent with a vegetation. It appears to be located   on the tricuspid valve. This is consistent with endocarditis   given his known MSSA bacteremia.  Echo (TTE) 01/15/18: Impressions: - Normal LV size with mild LV hypertrophy. EF 55%. Moderate   diastolic dysfunction. Normal RV size with mildly reduced   systolic function. Bioprosthetic aortic valve appeared normal.   Past Medical History:  Diagnosis Date  . 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 Acute and subacute infective endocarditis   . Bicuspid aortic valve    a. 02/2009 s/p tissue AVR;  b. 07/2014 Echo: EF 55-60%, basal-mid inf HK, mild AS/MR, mod dil LA, PASP 44 mmHg.  Marland Kitchen CAD (coronary artery disease) October 2010   a. 02/2009 NSTEMI/Cath: 3VD; b. 02/2009 CABG x 5: LIMA->LAD, VG->Diag, VG->RI, VG->RVM->RPDA.  . Cellulitis of left lower extremity 04/26/2016  . Chronic kidney disease    stage 5  . Gangrene of gallbladder in cholecystitis   . GERD (gastroesophageal reflux disease)   . Heart murmur   . Hyperlipidemia   . Hypertension   . Myocardial infarction ([place]) 2010  . PVD (peripheral vascular disease) ([place])    a. s/p toe amputations on L foot.  . Right foot infection 01/15/2018  . Sepsis due to methicillin susceptible Staphylococcus aureus ([place])   .  Type II diabetes mellitus ([place])     Past Surgical History:  Procedure Laterality Date  . AMPUTATION Left 2011   great and second toes  . AMPUTATION  07/27/2011   Procedure: AMPUTATION FOOT;  Surgeon: Newt Minion, MD;  Location: [place];  Service: Orthopedics;  Laterality: Left;  Left Midfoot Amputation   . AMPUTATION Left 12/08/2014   Procedure: Left Below Knee Amputation;  Surgeon: Newt Minion, MD;  Location: [place];  Service: Orthopedics;  Laterality: Left;  . AMPUTATION Right 01/17/2018   Procedure: RIGHT BELOW KNEE AMPUTATION;  Surgeon: Newt Minion, MD;  Location: [place];   Service: Orthopedics;  Laterality: Right;  . AMPUTATION TOE Right    "all my toes on my right foot have been amputated"  . AORTIC VALVE REPLACEMENT  2010   Pericardial tissue valve  . BILIARY STENT PLACEMENT  03/06/2018   Procedure: BILIARY STENT PLACEMENT;  Surgeon: Irene Shipper, MD;  Location: [facility] ENDOSCOPY;  Service: Endoscopy;;  . CARDIAC CATHETERIZATION  2010  . CARDIAC VALVE REPLACEMENT    . CATARACT EXTRACTION W/ INTRAOCULAR LENS  IMPLANT, BILATERAL Bilateral   . CHOLECYSTECTOMY N/A 02/28/2018   Procedure: LAPAROSCOPIC CHOLECYSTECTOMY;  Surgeon: Coralie Keens, MD;  Location: [place];  Service: General;  Laterality: N/A;  . CORONARY ARTERY BYPASS GRAFT  2010   "CABG X5"  . ERCP N/A 03/06/2018   Procedure: ENDOSCOPIC RETROGRADE CHOLANGIOPANCREATOGRAPHY (ERCP);  Surgeon: Irene Shipper, MD;  Location: [facility] ENDOSCOPY;  Service: Endoscopy;  Laterality: N/A;  . ERCP N/A 05/26/2018   Procedure: ENDOSCOPIC RETROGRADE CHOLANGIOPANCREATOGRAPHY (ERCP);  Surgeon: Irene Shipper, MD;  Location: [facility] END[facility];  Service: Endoscopy;  Laterality: N/A;  . IR FLUORO GUIDE CV LINE RIGHT  02/13/2018  . IR REMOVAL TUN CV CATH W/O FL  02/24/2018  . IR US GUIDE VASC ACCESS RIGHT  02/13/2018  . REMOVAL OF STONES  03/06/2018   Procedure: REMOVAL OF STONES;  Surgeon: Irene Shipper, MD;  Location: [facility] ENDOSCOPY;  Service: Endoscopy;;  . Joan Mayans  03/06/2018   Procedure: SPHINCTEROTOMY;  Surgeon: Irene Shipper, MD;  Location: [facility] ENDOSCOPY;  Service: Endoscopy;;  . Lavell Islam REMOVAL  05/26/2018   Procedure: STENT REMOVAL;  Surgeon: Irene Shipper, MD;  Location: [facility] ENDOSCOPY;  Service: Endoscopy;;  . TEE WITHOUT CARDIOVERSION N/A 01/21/2018   Procedure: TRANSESOPHAGEAL ECHOCARDIOGRAM (TEE);  Surgeon: Buford Dresser, MD;  Location: [facility] ENDOSCOPY;  Service: Cardiovascular;  Laterality: N/A;    MEDICATIONS: No current facility-administered medications for this encounter.    Marland Kitchen amLODipine (NORVASC) 2.5 MG  tablet  . Calcium Citrate-Vitamin D (CALCIUM CITRATE + D3) 250-200 MG-UNIT TABS  . cholestyramine (QUESTRAN) 4 g packet  . doxycycline (VIBRA-TABS) 100 MG tablet  . insulin glargine (LANTUS) 100 UNIT/ML injection  . latanoprost (XALATAN) 0.005 % ophthalmic solution  . metoprolol tartrate (LOPRESSOR) 50 MG tablet  . pantoprazole (PROTONIX) 40 MG tablet  . simvastatin (ZOCOR) 20 MG tablet  . warfarin (COUMADIN) 2 MG tablet  No longer on warfarin.   Myra Gianotti, PA-C Surgical Short Stay/Anesthesiology [facility] Phone [phone] [facility] Phone [phone] 10/23/2018 5:22 PM

## 2018-10-24 ENCOUNTER — Encounter (HOSPITAL_COMMUNITY)
Admission: RE | Disposition: A | Discharge status: Home Health | Payer: Self-pay | Source: Home / Self Care | Attending: Orthopedic Surgery

## 2018-10-24 ENCOUNTER — Inpatient Hospital Stay (HOSPITAL_COMMUNITY): Payer: Medicare Other | Admitting: Vascular Surgery

## 2018-10-24 ENCOUNTER — Encounter (HOSPITAL_COMMUNITY): Payer: Self-pay

## 2018-10-24 ENCOUNTER — Other Ambulatory Visit: Payer: Self-pay

## 2018-10-24 ENCOUNTER — Inpatient Hospital Stay (HOSPITAL_COMMUNITY)
Admission: RE | Admit: 2018-10-24 | Discharge: 2018-10-28 | DRG: 475 | Disposition: A | Discharge status: Home Health | Payer: Medicare Other | Attending: Orthopedic Surgery | Admitting: Orthopedic Surgery

## 2018-10-24 DIAGNOSIS — Z9049 Acquired absence of other specified parts of digestive tract: Secondary | ICD-10-CM | POA: Diagnosis not present

## 2018-10-24 DIAGNOSIS — E1151 Type 2 diabetes mellitus with diabetic peripheral angiopathy without gangrene: Secondary | ICD-10-CM | POA: Diagnosis present

## 2018-10-24 DIAGNOSIS — L03113 Cellulitis of right upper limb: Secondary | ICD-10-CM | POA: Diagnosis present

## 2018-10-24 DIAGNOSIS — I2510 Atherosclerotic heart disease of native coronary artery without angina pectoris: Secondary | ICD-10-CM | POA: Diagnosis present

## 2018-10-24 DIAGNOSIS — Z951 Presence of aortocoronary bypass graft: Secondary | ICD-10-CM

## 2018-10-24 DIAGNOSIS — I480 Paroxysmal atrial fibrillation: Secondary | ICD-10-CM | POA: Diagnosis present

## 2018-10-24 DIAGNOSIS — T8781 Dehiscence of amputation stump: Secondary | ICD-10-CM | POA: Diagnosis present

## 2018-10-24 DIAGNOSIS — E1169 Type 2 diabetes mellitus with other specified complication: Secondary | ICD-10-CM | POA: Diagnosis present

## 2018-10-24 DIAGNOSIS — L02415 Cutaneous abscess of right lower limb: Secondary | ICD-10-CM | POA: Diagnosis present

## 2018-10-24 DIAGNOSIS — E1122 Type 2 diabetes mellitus with diabetic chronic kidney disease: Secondary | ICD-10-CM | POA: Diagnosis present

## 2018-10-24 DIAGNOSIS — N185 Chronic kidney disease, stage 5: Secondary | ICD-10-CM | POA: Diagnosis present

## 2018-10-24 DIAGNOSIS — Z23 Encounter for immunization: Secondary | ICD-10-CM

## 2018-10-24 DIAGNOSIS — Y835 Amputation of limb(s) as the cause of abnormal reaction of the patient, or of later complication, without mention of misadventure at the time of the procedure: Secondary | ICD-10-CM | POA: Diagnosis present

## 2018-10-24 DIAGNOSIS — Z1159 Encounter for screening for other viral diseases: Secondary | ICD-10-CM | POA: Diagnosis not present

## 2018-10-24 DIAGNOSIS — Z794 Long term (current) use of insulin: Secondary | ICD-10-CM

## 2018-10-24 DIAGNOSIS — I252 Old myocardial infarction: Secondary | ICD-10-CM | POA: Diagnosis not present

## 2018-10-24 DIAGNOSIS — K219 Gastro-esophageal reflux disease without esophagitis: Secondary | ICD-10-CM | POA: Diagnosis present

## 2018-10-24 DIAGNOSIS — E785 Hyperlipidemia, unspecified: Secondary | ICD-10-CM | POA: Diagnosis present

## 2018-10-24 DIAGNOSIS — Z87891 Personal history of nicotine dependence: Secondary | ICD-10-CM | POA: Diagnosis not present

## 2018-10-24 DIAGNOSIS — Z8249 Family history of ischemic heart disease and other diseases of the circulatory system: Secondary | ICD-10-CM

## 2018-10-24 DIAGNOSIS — Z881 Allergy status to other antibiotic agents status: Secondary | ICD-10-CM | POA: Diagnosis not present

## 2018-10-24 DIAGNOSIS — Z89511 Acquired absence of right leg below knee: Secondary | ICD-10-CM

## 2018-10-24 DIAGNOSIS — Z888 Allergy status to other drugs, medicaments and biological substances status: Secondary | ICD-10-CM

## 2018-10-24 DIAGNOSIS — Z79899 Other long term (current) drug therapy: Secondary | ICD-10-CM

## 2018-10-24 DIAGNOSIS — I129 Hypertensive chronic kidney disease with stage 1 through stage 4 chronic kidney disease, or unspecified chronic kidney disease: Secondary | ICD-10-CM | POA: Diagnosis present

## 2018-10-24 DIAGNOSIS — Z833 Family history of diabetes mellitus: Secondary | ICD-10-CM

## 2018-10-24 DIAGNOSIS — Z09 Encounter for follow-up examination after completed treatment for conditions other than malignant neoplasm: Secondary | ICD-10-CM

## 2018-10-24 DIAGNOSIS — Z953 Presence of xenogenic heart valve: Secondary | ICD-10-CM | POA: Diagnosis not present

## 2018-10-24 DIAGNOSIS — M869 Osteomyelitis, unspecified: Principal | ICD-10-CM | POA: Diagnosis present

## 2018-10-24 HISTORY — PX: STUMP REVISION: SHX6102

## 2018-10-24 HISTORY — DX: Paroxysmal atrial fibrillation: I48.0

## 2018-10-24 LAB — CBC
HCT: 28.1 % — ABNORMAL LOW (ref 39.0–52.0)
Hemoglobin: 8.8 g/dL — ABNORMAL LOW (ref 13.0–17.0)
MCH: 27.5 pg (ref 26.0–34.0)
MCHC: 31.3 g/dL (ref 30.0–36.0)
MCV: 87.8 fL (ref 80.0–100.0)
Platelets: 196 10*3/uL (ref 150–400)
RBC: 3.2 MIL/uL — ABNORMAL LOW (ref 4.22–5.81)
RDW: 13.2 % (ref 11.5–15.5)
WBC: 12.6 10*3/uL — ABNORMAL HIGH (ref 4.0–10.5)
nRBC: 0 % (ref 0.0–0.2)

## 2018-10-24 LAB — BASIC METABOLIC PANEL
Anion gap: 7 (ref 5–15)
BUN: 14 mg/dL (ref 8–23)
CO2: 22 mmol/L (ref 22–32)
Calcium: 8.1 mg/dL — ABNORMAL LOW (ref 8.9–10.3)
Chloride: 104 mmol/L (ref 98–111)
Creatinine, Ser: 1.06 mg/dL (ref 0.61–1.24)
GFR calc Af Amer: >60 mL/min (ref >60)
GFR calc non Af Amer: >60 mL/min (ref >60)
Glucose, Bld: 291 mg/dL — ABNORMAL HIGH (ref 70–99)
Potassium: 4.2 mmol/L (ref 3.5–5.1)
Sodium: 133 mmol/L — ABNORMAL LOW (ref 135–145)

## 2018-10-24 LAB — GLUCOSE, CAPILLARY
Glucose-Capillary: 263 mg/dL — ABNORMAL HIGH (ref 70–99)
Glucose-Capillary: 254 mg/dL — ABNORMAL HIGH (ref 70–99)
Glucose-Capillary: 230 mg/dL — ABNORMAL HIGH (ref 70–99)
Glucose-Capillary: 193 mg/dL — ABNORMAL HIGH (ref 70–99)
Glucose-Capillary: 125 mg/dL — ABNORMAL HIGH (ref 70–99)
Glucose-Capillary: 122 mg/dL — ABNORMAL HIGH (ref 70–99)

## 2018-10-24 LAB — SARS CORONAVIRUS 2: SARS Coronavirus 2: NOT DETECTED

## 2018-10-24 SURGERY — REVISION, AMPUTATION SITE
Anesthesia: General | Site: Leg Lower | Laterality: Right

## 2018-10-24 MED ORDER — CLINDAMYCIN PHOSPHATE 900 MG/50ML IV SOLN
900.0000 mg | INTRAVENOUS | Status: DC
  Filled 2018-10-24: qty 50

## 2018-10-24 MED ORDER — PHENYLEPHRINE 40 MCG/ML (10ML) SYRINGE FOR IV PUSH (FOR BLOOD PRESSURE SUPPORT)
PREFILLED_SYRINGE | INTRAVENOUS | Status: DC | PRN
  Administered 2018-10-24: 120 ug via INTRAVENOUS
  Administered 2018-10-24: 80 ug via INTRAVENOUS
  Administered 2018-10-24: 120 ug via INTRAVENOUS

## 2018-10-24 MED ORDER — WARFARIN SODIUM 4 MG PO TABS: 4.0000 mg | ORAL_TABLET | Freq: Once | ORAL | Status: DC

## 2018-10-24 MED ORDER — DEXAMETHASONE SODIUM PHOSPHATE 10 MG/ML IJ SOLN
INTRAMUSCULAR | Status: AC
  Filled 2018-10-24: qty 2

## 2018-10-24 MED ORDER — LIDOCAINE 2% (20 MG/ML) 5 ML SYRINGE
INTRAMUSCULAR | Status: AC
  Filled 2018-10-24: qty 10

## 2018-10-24 MED ORDER — ONDANSETRON HCL 4 MG/2ML IJ SOLN: 4.0000 mg | Freq: Four times a day (QID) | INTRAMUSCULAR | Status: DC | PRN

## 2018-10-24 MED ORDER — LIDOCAINE 2% (20 MG/ML) 5 ML SYRINGE
INTRAMUSCULAR | Status: DC | PRN
  Administered 2018-10-24: 60 mg via INTRAVENOUS

## 2018-10-24 MED ORDER — ACETAMINOPHEN 500 MG PO TABS
1000.0000 mg | ORAL_TABLET | Freq: Once | ORAL | Status: AC
  Administered 2018-10-24: 1000 mg via ORAL
  Filled 2018-10-24: qty 2

## 2018-10-24 MED ORDER — PROPOFOL 10 MG/ML IV BOLUS
INTRAVENOUS | Status: DC | PRN
  Administered 2018-10-24: 100 mg via INTRAVENOUS

## 2018-10-24 MED ORDER — FENTANYL CITRATE (PF) 100 MCG/2ML IJ SOLN
INTRAMUSCULAR | Status: AC
  Filled 2018-10-24: qty 2

## 2018-10-24 MED ORDER — WARFARIN - PHARMACIST DOSING INPATIENT: Freq: Every day | Status: DC

## 2018-10-24 MED ORDER — TRAMADOL HCL 50 MG PO TABS
50.0000 mg | ORAL_TABLET | Freq: Four times a day (QID) | ORAL | Status: DC
  Administered 2018-10-24 – 2018-10-28 (×16): 50 mg via ORAL
  Filled 2018-10-24 (×16): qty 1

## 2018-10-24 MED ORDER — CLINDAMYCIN PHOSPHATE 600 MG/50ML IV SOLN
600.0000 mg | Freq: Three times a day (TID) | INTRAVENOUS | Status: DC
  Administered 2018-10-24 – 2018-10-28 (×12): 600 mg via INTRAVENOUS
  Filled 2018-10-24 (×12): qty 50

## 2018-10-24 MED ORDER — OXYCODONE HCL 5 MG PO TABS
10.0000 mg | ORAL_TABLET | ORAL | Status: DC | PRN
  Administered 2018-10-25: 10:00:00 15 mg via ORAL
  Administered 2018-10-25 – 2018-10-26 (×2): 10 mg via ORAL
  Filled 2018-10-24: qty 3

## 2018-10-24 MED ORDER — 0.9 % SODIUM CHLORIDE (POUR BTL) OPTIME
TOPICAL | Status: DC | PRN
  Administered 2018-10-24: 1000 mL

## 2018-10-24 MED ORDER — FENTANYL CITRATE (PF) 100 MCG/2ML IJ SOLN
25.0000 ug | INTRAMUSCULAR | Status: DC | PRN
  Administered 2018-10-24 (×2): 50 ug via INTRAVENOUS

## 2018-10-24 MED ORDER — LACTATED RINGERS IV SOLN
INTRAVENOUS | Status: DC
  Administered 2018-10-24 (×2): via INTRAVENOUS

## 2018-10-24 MED ORDER — CALCIUM CITRATE-VITAMIN D 250-200 MG-UNIT PO TABS: 1.0000 | ORAL_TABLET | Freq: Two times a day (BID) | ORAL | Status: DC

## 2018-10-24 MED ORDER — AMLODIPINE BESYLATE 2.5 MG PO TABS
2.5000 mg | ORAL_TABLET | Freq: Every day | ORAL | Status: DC
  Administered 2018-10-25 – 2018-10-28 (×4): 2.5 mg via ORAL
  Filled 2018-10-24 (×4): qty 1

## 2018-10-24 MED ORDER — HYDROMORPHONE HCL 1 MG/ML IJ SOLN
0.5000 mg | INTRAMUSCULAR | Status: DC | PRN
  Administered 2018-10-24 – 2018-10-25 (×2): 1 mg via INTRAVENOUS
  Filled 2018-10-24 (×2): qty 1

## 2018-10-24 MED ORDER — METOCLOPRAMIDE HCL 5 MG PO TABS: 5.0000 mg | ORAL_TABLET | Freq: Three times a day (TID) | ORAL | Status: DC | PRN

## 2018-10-24 MED ORDER — PHENYLEPHRINE 40 MCG/ML (10ML) SYRINGE FOR IV PUSH (FOR BLOOD PRESSURE SUPPORT)
PREFILLED_SYRINGE | INTRAVENOUS | Status: AC
  Filled 2018-10-24: qty 10

## 2018-10-24 MED ORDER — ONDANSETRON HCL 4 MG/2ML IJ SOLN
INTRAMUSCULAR | Status: AC
  Filled 2018-10-24: qty 4

## 2018-10-24 MED ORDER — INSULIN ASPART 100 UNIT/ML ~~LOC~~ SOLN
SUBCUTANEOUS | Status: AC
  Administered 2018-10-25: 09:00:00 2 [IU] via SUBCUTANEOUS
  Filled 2018-10-24: qty 1

## 2018-10-24 MED ORDER — EPHEDRINE SULFATE 50 MG/ML IJ SOLN
INTRAMUSCULAR | Status: DC | PRN
  Administered 2018-10-24: 10 mg via INTRAVENOUS

## 2018-10-24 MED ORDER — POVIDONE-IODINE 10 % EX SWAB: 2.0000 "application " | Freq: Once | CUTANEOUS | Status: DC

## 2018-10-24 MED ORDER — ACETAMINOPHEN 325 MG PO TABS
325.0000 mg | ORAL_TABLET | Freq: Four times a day (QID) | ORAL | Status: DC | PRN
  Administered 2018-10-26 – 2018-10-28 (×2): 650 mg via ORAL
  Filled 2018-10-24 (×2): qty 2

## 2018-10-24 MED ORDER — OXYCODONE HCL 5 MG PO TABS
ORAL_TABLET | ORAL | Status: AC
  Administered 2018-10-25: 10:00:00 15 mg via ORAL
  Filled 2018-10-24: qty 2

## 2018-10-24 MED ORDER — OXYCODONE HCL 5 MG PO TABS
5.0000 mg | ORAL_TABLET | ORAL | Status: DC | PRN
  Administered 2018-10-24: 11:00:00 10 mg via ORAL
  Filled 2018-10-24 (×2): qty 2

## 2018-10-24 MED ORDER — MIDAZOLAM HCL 2 MG/2ML IJ SOLN
INTRAMUSCULAR | Status: AC
  Filled 2018-10-24: qty 2

## 2018-10-24 MED ORDER — FENTANYL CITRATE (PF) 100 MCG/2ML IJ SOLN
INTRAMUSCULAR | Status: DC | PRN
  Administered 2018-10-24: 100 ug via INTRAVENOUS

## 2018-10-24 MED ORDER — METOPROLOL TARTRATE 50 MG PO TABS
50.0000 mg | ORAL_TABLET | Freq: Two times a day (BID) | ORAL | Status: DC
  Administered 2018-10-24 – 2018-10-28 (×8): 50 mg via ORAL
  Filled 2018-10-24 (×8): qty 1

## 2018-10-24 MED ORDER — METOCLOPRAMIDE HCL 5 MG/ML IJ SOLN: 5.0000 mg | Freq: Three times a day (TID) | INTRAMUSCULAR | Status: DC | PRN

## 2018-10-24 MED ORDER — PNEUMOCOCCAL VAC POLYVALENT 25 MCG/0.5ML IJ INJ
0.5000 mL | INJECTION | INTRAMUSCULAR | Status: AC
  Administered 2018-10-25: 0.5 mL via INTRAMUSCULAR
  Filled 2018-10-24: qty 0.5

## 2018-10-24 MED ORDER — CHOLESTYRAMINE 4 G PO PACK
1.0000 | PACK | Freq: Every day | ORAL | Status: DC
  Administered 2018-10-25 – 2018-10-28 (×4): 1 via ORAL
  Filled 2018-10-24 (×4): qty 1

## 2018-10-24 MED ORDER — LACTATED RINGERS IV SOLN
INTRAVENOUS | Status: DC | PRN
  Administered 2018-10-24: 08:00:00 via INTRAVENOUS

## 2018-10-24 MED ORDER — INSULIN ASPART 100 UNIT/ML ~~LOC~~ SOLN
4.0000 [IU] | Freq: Once | SUBCUTANEOUS | Status: AC
  Administered 2018-10-24: 4 [IU] via SUBCUTANEOUS

## 2018-10-24 MED ORDER — INSULIN NPH (HUMAN) (ISOPHANE) 100 UNIT/ML ~~LOC~~ SUSP: 4.0000 [IU] | Freq: Once | SUBCUTANEOUS | Status: DC

## 2018-10-24 MED ORDER — PANTOPRAZOLE SODIUM 40 MG PO TBEC
40.0000 mg | DELAYED_RELEASE_TABLET | Freq: Every day | ORAL | Status: DC
  Administered 2018-10-25 – 2018-10-28 (×4): 40 mg via ORAL
  Filled 2018-10-24 (×4): qty 1

## 2018-10-24 MED ORDER — INSULIN GLARGINE 100 UNIT/ML ~~LOC~~ SOLN
10.0000 [IU] | Freq: Every day | SUBCUTANEOUS | Status: DC
  Administered 2018-10-24 – 2018-10-27 (×4): 10 [IU] via SUBCUTANEOUS
  Filled 2018-10-24 (×6): qty 0.1

## 2018-10-24 MED ORDER — SIMVASTATIN 20 MG PO TABS
20.0000 mg | ORAL_TABLET | Freq: Every day | ORAL | Status: DC
  Administered 2018-10-24 – 2018-10-27 (×4): 20 mg via ORAL
  Filled 2018-10-24 (×4): qty 1

## 2018-10-24 MED ORDER — LACTATED RINGERS IV SOLN
INTRAVENOUS | Status: DC
  Administered 2018-10-24 – 2018-10-28 (×3): via INTRAVENOUS

## 2018-10-24 MED ORDER — CEFAZOLIN SODIUM-DEXTROSE 1-4 GM/50ML-% IV SOLN: 1.0000 g | Freq: Four times a day (QID) | INTRAVENOUS | Status: DC

## 2018-10-24 MED ORDER — CHLORHEXIDINE GLUCONATE 4 % EX LIQD: 60.0000 mL | Freq: Once | CUTANEOUS | Status: DC

## 2018-10-24 MED ORDER — ENSURE PRE-SURGERY PO LIQD
296.0000 mL | Freq: Once | ORAL | Status: DC
  Filled 2018-10-24: qty 296

## 2018-10-24 MED ORDER — DOCUSATE SODIUM 100 MG PO CAPS
100.0000 mg | ORAL_CAPSULE | Freq: Two times a day (BID) | ORAL | Status: DC
  Administered 2018-10-25 – 2018-10-26 (×2): 100 mg via ORAL
  Filled 2018-10-24 (×6): qty 1

## 2018-10-24 MED ORDER — FENTANYL CITRATE (PF) 250 MCG/5ML IJ SOLN
INTRAMUSCULAR | Status: AC
  Filled 2018-10-24: qty 5

## 2018-10-24 MED ORDER — LATANOPROST 0.005 % OP SOLN
1.0000 [drp] | Freq: Every day | OPHTHALMIC | Status: DC
  Administered 2018-10-24 – 2018-10-27 (×4): 1 [drp] via OPHTHALMIC
  Filled 2018-10-24: qty 2.5

## 2018-10-24 MED ORDER — ONDANSETRON HCL 4 MG/2ML IJ SOLN
INTRAMUSCULAR | Status: DC | PRN
  Administered 2018-10-24: 4 mg via INTRAVENOUS

## 2018-10-24 MED ORDER — ONDANSETRON HCL 4 MG PO TABS: 4.0000 mg | ORAL_TABLET | Freq: Four times a day (QID) | ORAL | Status: DC | PRN

## 2018-10-24 MED ORDER — INSULIN ASPART 100 UNIT/ML ~~LOC~~ SOLN
0.0000 [IU] | Freq: Three times a day (TID) | SUBCUTANEOUS | Status: DC
  Administered 2018-10-24: 17:00:00 1 [IU] via SUBCUTANEOUS
  Administered 2018-10-25 – 2018-10-27 (×3): 2 [IU] via SUBCUTANEOUS
  Administered 2018-10-27: 12:00:00 1 [IU] via SUBCUTANEOUS

## 2018-10-24 SURGICAL SUPPLY — 34 items
BLADE SAW RECIP 87.9 MT (BLADE) IMPLANT
BLADE SURG 21 STRL SS (BLADE) ×3 IMPLANT
BNDG COHESIVE 4X5 TAN STRL (GAUZE/BANDAGES/DRESSINGS) ×2 IMPLANT
CANISTER WOUND CARE 500ML ATS (WOUND CARE) ×3 IMPLANT
CONT SPEC 4OZ CLIKSEAL STRL BL (MISCELLANEOUS) ×2 IMPLANT
COVER SURGICAL LIGHT HANDLE (MISCELLANEOUS) ×3 IMPLANT
COVER WAND RF STERILE (DRAPES) ×3 IMPLANT
DRAPE EXTREMITY T 121X128X90 (DISPOSABLE) ×3 IMPLANT
DRAPE HALF SHEET 40X57 (DRAPES) ×3 IMPLANT
DRAPE INCISE IOBAN 66X45 STRL (DRAPES) ×3 IMPLANT
DRAPE U-SHAPE 47X51 STRL (DRAPES) ×6 IMPLANT
DRESSING PREVENA PLUS CUSTOM (GAUZE/BANDAGES/DRESSINGS) ×1 IMPLANT
DRSG PREVENA PLUS CUSTOM (GAUZE/BANDAGES/DRESSINGS) ×6
DURAPREP 26ML APPLICATOR (WOUND CARE) ×3 IMPLANT
ELECT REM PT RETURN 9FT ADLT (ELECTROSURGICAL) ×3
ELECTRODE REM PT RTRN 9FT ADLT (ELECTROSURGICAL) ×1 IMPLANT
GLOVE BIOGEL PI IND STRL 9 (GLOVE) ×1 IMPLANT
GLOVE BIOGEL PI INDICATOR 9 (GLOVE) ×2
GLOVE SURG ORTHO 9.0 STRL STRW (GLOVE) ×3 IMPLANT
GOWN STRL REUS W/ TWL XL LVL3 (GOWN DISPOSABLE) ×2 IMPLANT
GOWN STRL REUS W/TWL XL LVL3 (GOWN DISPOSABLE) ×6
KIT BASIN OR (CUSTOM PROCEDURE TRAY) ×3 IMPLANT
KIT TURNOVER KIT B (KITS) ×3 IMPLANT
MANIFOLD NEPTUNE II (INSTRUMENTS) ×3 IMPLANT
NS IRRIG 1000ML POUR BTL (IV SOLUTION) ×3 IMPLANT
PACK GENERAL/GYN (CUSTOM PROCEDURE TRAY) ×3 IMPLANT
PAD ARMBOARD 7.5X6 YLW CONV (MISCELLANEOUS) ×3 IMPLANT
PREVENA RESTOR ARTHOFORM 33X30 (CANNISTER) ×2 IMPLANT
PREVENA RESTOR ARTHOFORM 46X30 (CANNISTER) ×3 IMPLANT
STAPLER VISISTAT 35W (STAPLE) IMPLANT
SUT ETHILON 2 0 PSLX (SUTURE) ×6 IMPLANT
SUT SILK 2 0 (SUTURE)
SUT SILK 2-0 18XBRD TIE 12 (SUTURE) IMPLANT
TOWEL OR 17X26 10 PK STRL BLUE (TOWEL DISPOSABLE) ×3 IMPLANT

## 2018-10-24 NOTE — Anesthesia Procedure Notes (Signed)
Arterial Line Insertion Start/End6/19/2020 1:15 PM, 10/24/2018 1:25 PM Performed by: Wilburn Cornelia, CRNA  Preanesthetic checklist: patient identified, IV checked and site marked Right, radial was placed Catheter size: 20 G Hand hygiene performed   Attempts: 2 Procedure performed without using ultrasound guided technique. Following insertion, dressing applied and Biopatch. Post procedure assessment: normal and unchanged  Patient tolerated the procedure well with no immediate complications.

## 2018-10-24 NOTE — Progress Notes (Signed)
ANTICOAGULATION CONSULT NOTE - Follow Up Consult  Pharmacy Consult for Warfarin Indication: PAF  Allergies  Allergen Reactions  . Ace Inhibitors Cough  . Cefazolin     Possible cause of AKI, AIN    Patient Measurements: Height: 5\' 10"  (177.8 cm) Weight: 170 lb (77.1 kg) IBW/kg (Calculated) : 73   Vital Signs: Temp: 98.2 F (36.8 C) (06/19 1545) Temp Source: Oral (06/19 1545) BP: 128/69 (06/19 1545) Pulse Rate: 79 (06/19 1543)  Labs: Recent Labs    10/24/18 0714  HGB 8.8*  HCT 28.1*  PLT 196  CREATININE 1.06    Estimated Creatinine Clearance: 75.6 mL/min (by C-G formula based on SCr of 1.06 mg/dL).  Assessment: 63 year old male on warfarin prior to admission for PAF S/p BKA revision today Dose prior to admission = 2 mg po daily  Goal of Therapy:  INR 2-3 Monitor platelets by anticoagulation protocol: Yes   Plan:  Warfarin 4 mg po x 1 dose at 1800 starting 6/20 Daily INR starting 6/21  Thank you Anette Guarneri, PharmD [phone]  10/24/2018,4:04 PM

## 2018-10-24 NOTE — Plan of Care (Signed)
Problem: Education: Goal: Knowledge of General Education information will improve Description: Including pain rating scale, medication(s)/side effects and non-pharmacologic comfort measures Outcome: Progressing   Problem: Health Behavior/Discharge Planning: Goal: Ability to manage health-related needs will improve Outcome: Progressing   Problem: Clinical Measurements: Goal: Ability to maintain clinical measurements within normal limits will improve Outcome: Progressing Goal: Respiratory complications will improve Outcome: Progressing Goal: Cardiovascular complication will be avoided Outcome: Progressing   Problem: Nutrition: Goal: Adequate nutrition will be maintained Outcome: Progressing   Problem: Coping: Goal: Level of anxiety will decrease Outcome: Progressing   Problem: Pain Managment: Goal: General experience of comfort will improve Outcome: Progressing   Problem: Safety: Goal: Ability to remain free from injury will improve Outcome: Progressing   Problem: Skin Integrity: Goal: Risk for impaired skin integrity will decrease Outcome: Progressing

## 2018-10-24 NOTE — Progress Notes (Signed)
Pt arrived to 5N25 via stretcher after surgery. Received report from Mickel Baas, [place] in PACU. See assessment. Will continue to monitor.

## 2018-10-24 NOTE — Anesthesia Procedure Notes (Signed)
Procedure Name: LMA Insertion Date/Time: 10/24/2018 9:50 AM Performed by: Neldon Newport, CRNA Pre-anesthesia Checklist: Timeout performed, Patient being monitored, Suction available, Emergency Drugs available and Patient identified Patient Re-evaluated:Patient Re-evaluated prior to induction Oxygen Delivery Method: Circle system utilized Preoxygenation: Pre-oxygenation with 100% oxygen Induction Type: IV induction Ventilation: Mask ventilation without difficulty LMA Size: 4.0 Placement Confirmation: breath sounds checked- equal and bilateral and positive ETCO2 Tube secured with: Tape Dental Injury: Teeth and Oropharynx as per pre-operative assessment

## 2018-10-24 NOTE — Transfer of Care (Signed)
Immediate Anesthesia Transfer of Care Note  Patient: [name]  Procedure(s) Performed: REVISION RIGHT BELOW KNEE AMPUTATION (Right Leg Lower)  Patient Location: PACU  Anesthesia Type:General  Level of Consciousness: awake, alert  and oriented  Airway & Oxygen Therapy: Patient Spontanous Breathing and Patient connected to nasal cannula oxygen  Post-op Assessment: Report given to RN, Post -op Vital signs reviewed and stable and Patient moving all extremities X 4  Post vital signs: Reviewed and stable  Last Vitals:  Vitals Value Taken Time  BP 102/53 10/24/18 1033  Temp    Pulse 73 10/24/18 1034  Resp 20 10/24/18 1034  SpO2 99 % 10/24/18 1034  Vitals shown include unvalidated device data.  Last Pain:  Vitals:   10/24/18 0648  TempSrc:   PainSc: 3          Complications: No apparent anesthesia complications

## 2018-10-24 NOTE — Op Note (Signed)
10/24/2018  10:28 AM  PATIENT:  [name]    PRE-OPERATIVE DIAGNOSIS:  Osteomyelitis Right Below Knee Amputation  POST-OPERATIVE DIAGNOSIS:  Same  PROCEDURE:  REVISION RIGHT BELOW KNEE AMPUTATION  SURGEON:  Newt Minion, MD  PHYSICIAN ASSISTANT:None ANESTHESIA:   General  PREOPERATIVE INDICATIONS:  [name] is a  63 y.o. male with a diagnosis of Osteomyelitis Right Below Knee Amputation who failed conservative measures and elected for surgical management.    The risks benefits and alternatives were discussed with the patient preoperatively including but not limited to the risks of infection, bleeding, nerve injury, cardiopulmonary complications, the need for revision surgery, among others, and the patient was willing to proceed.  OPERATIVE IMPLANTS: Restore and customizable wound VAC  @ENCIMAGES @  OPERATIVE FINDINGS: Large abscess with osteomyelitis of the distal tibia and fibula.  Tissue sent for cultures.  OPERATIVE PROCEDURE: Patient brought the operating room and underwent a general anesthetic.  After adequate levels anesthesia were obtained patient's right lower extremity was prepped using DuraPrep draped into a sterile field a timeout was called.  A fishmouth incision was made around the ulcerative tissue this extended down to the tibia.  There is a large deep abscess.  The distal 2 cm of tibia and fibula were resected in one block of tissue with the abscess tissue.  This was sent for cultures.  Electrocautery was used for hemostasis the wound was irrigated with normal saline the remaining soft tissue was not involved with the abscess.  2-0 nylon was used for wound closure a Praveena customizable and restore wound VAC was applied this had a good suction fit patient was extubated taken the PACU in stable condition.   DISCHARGE PLANNING:  Antibiotic duration: Continue antibiotics for cellulitis of right wrist and abscess right leg  Weightbearing:  Nonweightbearing on the right  Pain medication: Opioid pathway  Dressing care/ Wound VAC: Wound VAC for 1 week  Ambulatory devices: Walker  Discharge to: Anticipate discharge to home next week  Follow-up: In the office 1 week post operative.

## 2018-10-24 NOTE — H&P (Addendum)
[name] is an 63 y.o. male.   Chief Complaint: osteomyelitis right transtibial amputation HPI: The patient is a 63 year old gentleman who presented with abscess of his right transtibial amputation earlier this week.  Radiographs show destructive bony changes consistent with osteomyelitis of the tibia.  He also cellulitis of his right arm due to an abrasion.  He is being admitted for revision of his right transtibial amputation site and will be treated with intravenous antibiotic therapy for this as well as for the cellulitis of his right upper extremity.  Past Medical History:  Diagnosis Date  . 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 Acute and subacute infective endocarditis   . Bicuspid aortic valve    a. 02/2009 s/p tissue AVR;  b. 07/2014 Echo: EF 55-60%, basal-mid inf HK, mild AS/MR, mod dil LA, PASP 44 mmHg.  Marland Kitchen CAD (coronary artery disease) October 2010   a. 02/2009 NSTEMI/Cath: 3VD; b. 02/2009 CABG x 5: LIMA->LAD, VG->Diag, VG->RI, VG->RVM->RPDA.  . Cellulitis of left lower extremity 04/26/2016  . Chronic kidney disease    stage 5 - not on dialysis  Kidney failure due antibiotic for bad infection, kidneys are working again per pt  . Gangrene of gallbladder in cholecystitis   . GERD (gastroesophageal reflux disease)   . Heart murmur   . Hyperlipidemia   . Hypertension   . Myocardial infarction ([place]) 2010  . PAF (paroxysmal atrial fibrillation) ([place])    02/2018  . PVD (peripheral vascular disease) ([place])    a. s/p toe amputations on L foot.  . Right foot infection 01/15/2018  . Sepsis due to methicillin susceptible Staphylococcus aureus ([place])   . Type II diabetes mellitus ([place])     Past Surgical History:  Procedure Laterality Date  . AMPUTATION Left 2011   great and second toes  . AMPUTATION  07/27/2011   Procedure: AMPUTATION FOOT;  Surgeon: Newt Minion, MD;  Location: [place];  Service: Orthopedics;  Laterality: Left;  Left Midfoot Amputation    . AMPUTATION Left 12/08/2014   Procedure: Left Below Knee Amputation;  Surgeon: Newt Minion, MD;  Location: [place];  Service: Orthopedics;  Laterality: Left;  . AMPUTATION Right 01/17/2018   Procedure: RIGHT BELOW KNEE AMPUTATION;  Surgeon: Newt Minion, MD;  Location: [place];  Service: Orthopedics;  Laterality: Right;  . AMPUTATION TOE Right    "all my toes on my right foot have been amputated"  . AORTIC VALVE REPLACEMENT  2010   Pericardial tissue valve  . BILIARY STENT PLACEMENT  03/06/2018   Procedure: BILIARY STENT PLACEMENT;  Surgeon: Irene Shipper, MD;  Location: [facility] ENDOSCOPY;  Service: Endoscopy;;  . CARDIAC CATHETERIZATION  2010  . CARDIAC VALVE REPLACEMENT    . CATARACT EXTRACTION W/ INTRAOCULAR LENS  IMPLANT, BILATERAL Bilateral   . CHOLECYSTECTOMY N/A 02/28/2018   Procedure: LAPAROSCOPIC CHOLECYSTECTOMY;  Surgeon: Coralie Keens, MD;  Location: [place];  Service: General;  Laterality: N/A;  . CORONARY ARTERY BYPASS GRAFT  2010   "CABG X5"  . ERCP N/A 03/06/2018   Procedure: ENDOSCOPIC RETROGRADE CHOLANGIOPANCREATOGRAPHY (ERCP);  Surgeon: Irene Shipper, MD;  Location: [facility] ENDOSCOPY;  Service: Endoscopy;  Laterality: N/A;  . ERCP N/A 05/26/2018   Procedure: ENDOSCOPIC RETROGRADE CHOLANGIOPANCREATOGRAPHY (ERCP);  Surgeon: Irene Shipper, MD;  Location: [facility];  Service: Endoscopy;  Laterality: N/A;  . IR FLUORO GUIDE CV LINE RIGHT  02/13/2018  . IR REMOVAL TUN CV CATH W/O  [facility]  02/24/2018  . IR US GUIDE VASC ACCESS RIGHT  02/13/2018  . REMOVAL OF STONES  03/06/2018   Procedure: REMOVAL OF STONES;  Surgeon: Irene Shipper, MD;  Location: [facility] ENDOSCOPY;  Service: Endoscopy;;  . Joan Mayans  03/06/2018   Procedure: SPHINCTEROTOMY;  Surgeon: Irene Shipper, MD;  Location: [facility] ENDOSCOPY;  Service: Endoscopy;;  . Lavell Islam REMOVAL  05/26/2018   Procedure: STENT REMOVAL;  Surgeon: Irene Shipper, MD;  Location: [facility] ENDOSCOPY;  Service: Endoscopy;;  . TEE WITHOUT CARDIOVERSION N/A 01/21/2018    Procedure: TRANSESOPHAGEAL ECHOCARDIOGRAM (TEE);  Surgeon: Buford Dresser, MD;  Location: [facility] ENDOSCOPY;  Service: Cardiovascular;  Laterality: N/A;    Family History  Problem Relation Age of Onset  . Heart attack Father        early age  . Heart disease Father   . Stroke Father   . Heart attack Brother        At age 84  . Stroke Brother   . Diabetes type II Mother   . Diabetes type II Sister   . Diabetes Other   . Coronary artery disease Other   . Colon cancer Neg Hx   . Esophageal cancer Neg Hx    Social History:  reports that he quit smoking about 18 years ago. His smoking use included cigarettes. He has a 24.00 pack-year smoking history. He has never used smokeless tobacco. He reports previous alcohol use. He reports that he does not use drugs.  Allergies:  Allergies  Allergen Reactions  . Ace Inhibitors Cough  . Cefazolin     Possible cause of AKI, AIN    Medications Prior to Admission  Medication Sig Dispense Refill  . amLODipine (NORVASC) 2.5 MG tablet Take 2.5 mg by mouth daily.    . Calcium Citrate-Vitamin D (CALCIUM CITRATE + D3) 250-200 MG-UNIT TABS Take 1 tablet by mouth 2 (two) times daily.    . cholestyramine (QUESTRAN) 4 g packet Take 1 packet by mouth every morning.    Marland Kitchen doxycycline (VIBRA-TABS) 100 MG tablet Take 1 tablet (100 mg total) by mouth 2 (two) times daily. 60 tablet 0  . insulin glargine (LANTUS) 100 UNIT/ML injection Inject 0.1 mLs (10 Units total) into the skin at bedtime. (Patient taking differently: Inject 16 Units into the skin at bedtime. ) 30 mL 0  . latanoprost (XALATAN) 0.005 % ophthalmic solution Place 1 drop into both eyes at bedtime.  3  . metoprolol tartrate (LOPRESSOR) 50 MG tablet Take 1 tablet (50 mg total) by mouth 2 (two) times daily. 30 tablet 0  . pantoprazole (PROTONIX) 40 MG tablet Take 40 mg by mouth daily.      . simvastatin (ZOCOR) 20 MG tablet Take 20 mg by mouth daily at 6 PM.   98  . warfarin (COUMADIN) 2 MG  tablet Take 1 tablet (2 mg total) by mouth daily at 6 PM. (Patient not taking: Reported on 10/23/2018)      Results for orders placed or performed during the hospital encounter of 10/24/18 (from the past 48 hour(s))  Glucose, capillary     Status: Abnormal   Collection Time: 10/24/18  6:13 AM  Result Value Ref Range   Glucose-Capillary 263 (H) 70 - 99 mg/dL   Xr Forearm Right  Result Date: 10/22/2018 2 view radiographs of the right forearm and wrist shows significant calcification of the radial and ulnar arteries.  Patient has no bony abnormalities.  No fractures.  Xr Tibia/fibula Right  Result Date:  10/22/2018 2 view radiographs of the right transtibial amputation shows calcification of the popliteal vessels the knee is congruent there is destructive bony changes over the distal tibia and fibula consistent with subacute osteomyelitis.  Xr Wrist 2 Views Right  Result Date: 10/22/2018 2 view radiographs of the right forearm and wrist shows significant calcification of the radial and ulnar arteries.  Patient has no bony abnormalities.  No fractures.   Review of Systems  All other systems reviewed and are negative.   Blood pressure 137/63, pulse 82, temperature 97.9 F (36.6 C), temperature source Oral, resp. rate 18, height 5\' 10"  (1.778 m), weight 77.1 kg, SpO2 99 %. Physical Exam  Constitutional: He is oriented to person, place, and time. He appears well-developed and well-nourished. No distress.  HENT:  Head: Normocephalic and atraumatic.  Neck: No tracheal deviation present. No thyromegaly present.  Cardiovascular: Normal rate.  Respiratory: Effort normal. No stridor. No respiratory distress.  GI: Soft. He exhibits no distension.  Musculoskeletal:     Comments: Examination there is cellulitis and swelling of the right wrist and forearm.  Is tender to palpation there are multiple abrasions there is no abscess.  Examination the right transtibial amputation his residual limb is  thin and atrophic there is a small 3 mm wound that probes all the way down to the tibia and fibula.  There is clear serosanguineous drainage and purulence.  Radiographs shows destructive bony changes.  This appears to be more infection that started in the tibia and has worked its way out does not appear to be a pressure wound from his prosthesis.  Neurological: He is alert and oriented to person, place, and time. No cranial nerve deficit.  Skin: Skin is warm.  Psychiatric: He has a normal mood and affect. His behavior is normal. Judgment and thought content normal.     Assessment/Plan Osteomyelitis of right transtibial amputation and cellulitis of right upper extremity plan revision right transtibial amputation and treatment with intravenous antibiotic therapy for his upper extremity cellulitis-the procedure and possible benefits and risks were discussed with the patient including the risk of bleeding, infection, neurovascular injury, and possible need for further surgery were discussed and the patient's questions answered to his satisfaction.  The patient wishes to proceed with surgery at this time.  Erlinda Hong, PA-C 10/24/2018, 7:16 AM [facility] [phone]

## 2018-10-25 LAB — PROTIME-INR
INR: 1.4 — ABNORMAL HIGH (ref 0.8–1.2)
Prothrombin Time: 16.6 seconds — ABNORMAL HIGH (ref 11.4–15.2)

## 2018-10-25 LAB — GLUCOSE, CAPILLARY
Glucose-Capillary: 158 mg/dL — ABNORMAL HIGH (ref 70–99)
Glucose-Capillary: 144 mg/dL — ABNORMAL HIGH (ref 70–99)
Glucose-Capillary: 113 mg/dL — ABNORMAL HIGH (ref 70–99)
Glucose-Capillary: 94 mg/dL (ref 70–99)

## 2018-10-25 LAB — URIC ACID: Uric Acid, Serum: 3.6 mg/dL — ABNORMAL LOW (ref 3.7–8.6)

## 2018-10-25 MED ORDER — ASPIRIN EC 325 MG PO TBEC
325.0000 mg | DELAYED_RELEASE_TABLET | Freq: Every day | ORAL | Status: DC
  Administered 2018-10-25 – 2018-10-28 (×4): 325 mg via ORAL
  Filled 2018-10-25 (×4): qty 1

## 2018-10-25 NOTE — Progress Notes (Signed)
Attempted to call Myshawn Chiriboga (contact person) unable to get in touch due to a busy phone line with each call.

## 2018-10-25 NOTE — Progress Notes (Signed)
The patient has co having most of his discomfort today being in the right wrist and hand.  Encouraged elevation.

## 2018-10-25 NOTE — Progress Notes (Addendum)
Transition of Care [facility]) - Initial/Assessment Note    Patient Details  Name: [name] MRN: [id number] Date of Birth: 1955-09-11  Transition of [facility]) CM/SW Contact:    Claudie Leach, RN Phone Number: 10/25/2018, 1:16 PM  Clinical Narrative:                 Pt plan is to d/c Monday to home in [place], [place] with family to support as needed. Patient lives alone but states family will be with him during day/evening and during night as needed.  Patient has WC, walker, cane, old crutches, 3n1, shower chair.  PT/RN set up for d/c planned on Monday with [facility].  Wound care orders should be added to [facility] order for wrist.    Will follow for therapy DME recommendations.  Expected Discharge Plan: [place] Barriers to Discharge: No Barriers Identified   Patient Goals and CMS Choice Patient states their goals for this hospitalization and ongoing recovery are:: to go home CMS Medicare.gov Compare Post Acute Care list provided to:: Patient Choice offered to / list presented to : Patient  Expected Discharge Plan and Services Expected Discharge Plan: [place] Choice: [place] arrangements for the past 2 months: Single Family Home                     HH Arranged: PT, RN [facility] Agency: [facility] Date [place]: 10/25/18 Time [place]: 1308 Representative spoke with at [place]: Oswald Hillock  Prior Living Arrangements/Services Living arrangements for the past 2 months: [place] with:: Self(Family to assist as needed) Patient language and need for interpreter reviewed:: Yes Do you feel safe going back to the place where you live?: Yes      Need for Family Participation in Patient Care: Yes (Comment) Care giver support system in place?: Yes (comment) Current home services: DME Criminal Activity/Legal Involvement Pertinent to Current Situation/Hospitalization: No -  Comment as needed  Activities of Daily Living Home Assistive Devices/Equipment: Prosthesis, Cane (specify quad or straight), Raised toilet seat with rails, Shower chair with back, CBG Meter(BLE BKA) ADL Screening (condition at time of admission) Patient's cognitive ability adequate to safely complete daily activities?: Yes Is the patient deaf or have difficulty hearing?: No Does the patient have difficulty seeing, even when wearing glasses/contacts?: No Does the patient have difficulty concentrating, remembering, or making decisions?: No Patient able to express need for assistance with ADLs?: Yes Does the patient have difficulty dressing or bathing?: No Independently performs ADLs?: Yes (appropriate for developmental age) Does the patient have difficulty walking or climbing stairs?: No Weakness of Legs: Right Weakness of Arms/Hands: None  Emotional Assessment   Attitude/Demeanor/Rapport: Engaged, Gracious Affect (typically observed): Accepting Orientation: : Oriented to Self, Oriented to Place, Oriented to  Time Alcohol / Substance Use: Not Applicable Psych Involvement: No (comment)  Admission diagnosis:  Acquired absence of right lower extremity below knee [facility]) [Z89.511] Patient Active Problem List   Diagnosis Date Noted  . Surgery follow-up 10/24/2018  . Acquired absence of right lower extremity below knee ([place]) 10/24/2018  . Dehiscence of amputation stump ([place])   . Abscess of right leg   . Encounter for removal of biliary stent   . Bile leak   . Bile leak, postoperative   . Acute gangrenous cholecystitis s/p lap cholecystectomy 02/28/2018 03/02/2018  . AKI (acute kidney injury) ([place])   .  Acute hypoxemic respiratory failure ([place]) 02/06/2018  . PAF (paroxysmal atrial fibrillation) ([place]) 02/05/2018  . Aortic atherosclerosis ([place]) 02/05/2018  . Acute renal failure (ARF) ([place]) 01/27/2018  . Hyponatremia 01/27/2018  . 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 S/P bilateral  BKA (below knee amputation) ([place])   . Post-operative pain   . Benign essential HTN   . Diabetes mellitus type 2 in obese ([place])   . PVD (peripheral vascular disease) ([place])   . History of prosthetic mitral valve   . Sepsis ([place]) 01/15/2018  . Prosthetic valve endocarditis ([place])   . Staphylococcus aureus bacteremia   . 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 Acute hematogenous osteomyelitis, right ankle and foot ([place])   . Splenic infarct 01/14/2018  . Renal insufficiency 01/14/2018  . Severe sepsis ([place]) 01/14/2018  . Dupuytren's disease of palm of both hands 03/18/2017  . S/P BKA (below knee amputation) unilateral, right ([place]) 12/08/2014  . Bicuspid aortic valve   . Hyperlipidemia   . Hypertension   . Greater trochanter fracture ([place]) 07/28/2014  . Hip fracture, right ([place]) 07/28/2014  . HLD (hyperlipidemia) 07/28/2014  . GERD (gastroesophageal reflux disease) 07/28/2014  . Hip fracture ([place]) 07/28/2014  . Fall 07/28/2014  . Leukocytosis 07/28/2014  . Thrombocytopenia ([place]) 07/28/2014  . H/O aortic valve replacement 10/16/2011  . 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 PLEURAL EFFUSION 05/25/2009  . OPEN WOUND OF CHEST , COMPLICATED 57/90/3833  . PROSTHETIC VALVE - BIO/ ENDOPROSTHESIS 05/25/2009  . Insulin-requiring or dependent type II diabetes mellitus ([place]) 04/19/2009  . HYPERLIPIDEMIA 04/19/2009  . Essential hypertension 04/19/2009  . CAD (coronary artery disease) 04/19/2009  . Peripheral vascular disease ([place]) 04/19/2009  . OSTEOMYELITIS 04/19/2009  . BICUSPID AORTIC VALVE 04/19/2009   PCP:  Patient, No Pcp Per Pharmacy:   [place], [place] - [place] [address] [place] [address] Phone: [phone] Fax: [phone]

## 2018-10-25 NOTE — Progress Notes (Signed)
Patient ID: [name], male   DOB: Sep 09, 1955, 63 y.o.   MRN: [id number] Postoperative day 1 revision right transtibial amputation.  There is no drainage in the wound VAC canister there are 2 checks.  The redness around the ulnar aspect of the right wrist is improving patient is currently on clindamycin I will check a uric acid level also.

## 2018-10-25 NOTE — Plan of Care (Signed)
  Problem: Safety: Goal: Ability to remain free from injury will improve Outcome: Progressing   Problem: Pain Managment: Goal: General experience of comfort will improve Outcome: Progressing   Problem: Skin Integrity: Goal: Risk for impaired skin integrity will decrease Outcome: Progressing   

## 2018-10-25 NOTE — Evaluation (Signed)
Occupational Therapy Evaluation Patient Details Name: [name] MRN: [id number] DOB: [birth year] Today's Date: 10/25/2018    History of Present Illness Pt is a 63 y.o. 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 PMH consists of R BKA (01/2018), L BKA (2016), PVD, HTN, and CKD.   Clinical Impression   Patient is s/p R BKA revision surgery resulting in functional limitations due to the deficits listed below (see OT problem list). Pt currently min (A) to sit EOB and R UE limited ROM for all transfers. Ot to continue to follow for edema management.  Patient will benefit from skilled OT acutely to increase independence and safety with ADLS to allow discharge SNF. Pt will need to reach mod I to d/c home alone. Question ability to use w/c in the home from previous admission notes about door frames.     Follow Up Recommendations  SNF    Equipment Recommendations  Other (comment)(sliding board/ home aide if going home)    Recommendations for Other Services       Precautions / Restrictions Precautions Precautions: Fall;Other (comment) Precaution Comments: wound vac R residual limb Restrictions Weight Bearing Restrictions: Yes      Mobility Bed Mobility Overal bed mobility: Needs Assistance Bed Mobility: Supine to Sit;Rolling Rolling: Mod assist   Supine to sit: Mod assist     General bed mobility comments: pt requires (A) to progress to L side static sitting with R UE supported to WB at elbow. Pt with MIN (A) at EOB  Transfers                 General transfer comment: declines    Balance                                           ADL either performed or assessed with clinical judgement   ADL Overall ADL's : Needs assistance/impaired Eating/Feeding: Moderate assistance   Grooming: Independent   Upper Body Bathing: Maximal assistance   Lower Body Bathing: Total assistance   Upper Body Dressing : Moderate assistance   Lower Body  Dressing: Total assistance                 General ADL Comments: pt declines OOB or to don prosthetic in room. pt with such limited ROM of R UE will not be abl to don prosthetics      Vision         Perception     Praxis      Pertinent Vitals/Pain Pain Assessment: Faces Faces Pain Scale: Hurts whole lot Pain Location: R hand Pain Descriptors / Indicators: Guarding;Grimacing Pain Intervention(s): Repositioned;Premedicated before session;Monitored during session     Hand Dominance Right   Extremity/Trunk Assessment Upper Extremity Assessment Upper Extremity Assessment: RUE deficits/detail RUE Deficits / Details: cellulitis being treated. pt with noted Dupuytrens contracture but patient denies contracture and reports having full range of all digits. Pt with PROM 2nd digit dip WFL, DIP  90 degrees MCP 20 degrees, 3rd and 4th digit pip dip and mcp all lacking ROM for flexion, 5th digit with increase flexion but limited dip pip and mcp  RUE: Unable to fully assess due to pain;Unable to fully assess due to immobilization RUE Coordination: decreased fine motor;decreased gross motor   Lower Extremity Assessment Lower Extremity Assessment: Defer to PT evaluation   Cervical / Trunk Assessment Cervical / Trunk Assessment:  Kyphotic   Communication Communication Communication: No difficulties   Cognition Arousal/Alertness: Awake/alert Behavior During Therapy: Flat affect Overall Cognitive Status: Difficult to assess                                 General Comments: pt with very flat responses, delayed responses and at time no response. pt closing eyes but still engaged in converstaion if asked a question. question reliability due to prevous admission not matching current  responses   General Comments  elevated R UE for edema management    Exercises     Shoulder Instructions      Home Living Family/patient expects to be discharged to:: Private  residence Living Arrangements: Alone Available Help at Discharge: Family;Available 24 hours/day Type of Home: Mobile home Home Access: Ramped entrance     Home Layout: One level     Bathroom Shower/Tub: Teacher, early years/pre: Standard Bathroom Accessibility: Yes   Home Equipment: Walker - standard;Cane - single point;Wheelchair - manual   Additional Comments: Above info provided by pt. Unsure of validity. Per chart from previous admission, w/c does not fit aound the house, only in his living room. Pt now reporting all rooms w/c accessible.      Prior Functioning/Environment Level of Independence: Independent with assistive device(s)        Comments: Ambulates with bilat prosthesis and cane in R hand.        OT Problem List: Decreased strength;Decreased range of motion;Decreased activity tolerance;Impaired balance (sitting and/or standing);Decreased safety awareness;Decreased cognition;Decreased knowledge of use of DME or AE;Decreased knowledge of precautions;Impaired sensation;Impaired UE functional use;Pain;Increased edema      OT Treatment/Interventions: Self-care/ADL training;Therapeutic exercise;Neuromuscular education;Energy conservation;DME and/or AE instruction;Manual therapy;Modalities;Therapeutic activities;Cognitive remediation/compensation;Patient/family education;Balance training    OT Goals(Current goals can be found in the care plan section) Acute Rehab OT Goals Patient Stated Goal: to return home OT Goal Formulation: With patient Time For Goal Achievement: 11/08/18 Potential to Achieve Goals: Good ADL Goals Pt Will Perform Grooming: with min guard assist;sitting Pt Will Perform Upper Body Bathing: with min assist;sitting Pt Will Transfer to Toilet: with transfer board;bedside commode;with min assist Additional ADL Goal #1: pt will complete basic transfer bed to w/c sliding board min (A) as precursor to adls.  OT Frequency: Min 3X/week    Barriers to D/C: Decreased caregiver support          Co-evaluation PT/OT/SLP Co-Evaluation/Treatment: Yes Reason for Co-Treatment: Complexity of the patient's impairments (multi-system involvement);Necessary to address cognition/behavior during functional activity;For patient/therapist safety;To address functional/ADL transfers   OT goals addressed during session: ADL's and self-care;Proper use of Adaptive equipment and DME;Strengthening/ROM      AM-PAC OT "6 Clicks" Daily Activity     Outcome Measure Help from another person eating meals?: A Lot Help from another person taking care of personal grooming?: A Lot Help from another person toileting, which includes using toliet, bedpan, or urinal?: A Lot Help from another person bathing (including washing, rinsing, drying)?: A Lot Help from another person to put on and taking off regular upper body clothing?: A Lot Help from another person to put on and taking off regular lower body clothing?: Total 6 Click Score: 11   End of Session Nurse Communication: Mobility status;Precautions  Activity Tolerance: Patient tolerated treatment well Patient left: in bed;with bed alarm set;with call bell/phone within reach  OT Visit Diagnosis: Unsteadiness on feet (R26.81);Muscle weakness (generalized) (M62.81)  Time: 8347-5830 OT Time Calculation (min): 19 min Charges:  OT General Charges $OT Visit: 1 Visit OT Evaluation $OT Eval Moderate Complexity: 1 Mod   Jeri Modena, OTR/L  Acute Rehabilitation Services Pager: [phone] Office: [phone] .   Jeri Modena 10/25/2018, 4:07 PM

## 2018-10-25 NOTE — Evaluation (Signed)
Physical Therapy Evaluation Patient Details Name: [name] MRN: [id number] DOB: 13-Feb-1956 Today's Date: 10/25/2018   History of Present Illness  Pt is a 63 y.o. 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 PMH consists of R BKA (01/2018), L BKA (2016), PVD, HTN, and CKD.    Clinical Impression  Pt admitted with above diagnosis. Pt currently with functional limitations due to the deficits listed below (see PT Problem List). PTA pt lived alone. He ambulated with bilat prosthesis and cane. Pt is currently NWB RLE due to BKA revision. He also has cellulitis R hand and is unable to tolerate ROM or WB. On eval, he required mod assist bed mobility and min assist to maintain sitting balance EOB. Pt declined OOB due to pain. Pt will benefit from skilled PT to increase their independence and safety with mobility to allow discharge to the venue listed below. Concerns regarding pt discharging home due to previous note that wheelchair will only fit in living room. If pt does d/c home, recommend sliding board, aide, and 24-hour assist.     Follow Up Recommendations SNF    Equipment Recommendations  None recommended by PT    Recommendations for Other Services       Precautions / Restrictions Precautions Precautions: Fall;Other (comment) Precaution Comments: wound vac R residual limb Restrictions Weight Bearing Restrictions: Yes RLE Weight Bearing: Non weight bearing      Mobility  Bed Mobility Overal bed mobility: Needs Assistance Bed Mobility: Supine to Sit;Rolling Rolling: Mod assist   Supine to sit: Mod assist     General bed mobility comments: +rail, cues for sequencing, use of bed pad to scoot to EOB  Transfers                 General transfer comment: Pt declined OOB  Ambulation/Gait                Stairs            Wheelchair Mobility    Modified Rankin (Stroke Patients Only)       Balance Overall balance assessment: Needs  assistance Sitting-balance support: Single extremity supported;Feet unsupported Sitting balance-Leahy Scale: Poor Sitting balance - Comments: min assist to maintain static sitting EOB Postural control: Posterior lean                                   Pertinent Vitals/Pain Pain Assessment: Faces Faces Pain Scale: Hurts whole lot Pain Location: R hand Pain Descriptors / Indicators: Guarding;Grimacing Pain Intervention(s): Repositioned;Limited activity within patient's tolerance;Premedicated before session    Home Living Family/patient expects to be discharged to:: Private residence Living Arrangements: Alone Available Help at Discharge: Family;Available 24 hours/day Type of Home: Mobile home Home Access: Ramped entrance     Home Layout: One level Home Equipment: Walker - standard;Cane - single point;Wheelchair - manual Additional Comments: Above info provided by pt. Unsure of validity. Per chart from previous admission, w/c does not fit aound the house, only in his living room. Pt now reporting all rooms w/c accessible.    Prior Function Level of Independence: Independent with assistive device(s)         Comments: Ambulates with bilat prosthesis and cane in R hand.     Hand Dominance   Dominant Hand: Right    Extremity/Trunk Assessment   Upper Extremity Assessment Upper Extremity Assessment: Defer to OT evaluation RUE Deficits / Details: cellulitis being treated.  pt with noted Dupuytrens contracture but patient denies contracture and reports having full range of all digits. Pt with PROM 2nd digit dip WFL, DIP  90 degrees MCP 20 degrees, 3rd and 4th digit pip dip and mcp all lacking ROM for flexion, 5th digit with increase flexion but limited dip pip and mcp  RUE: Unable to fully assess due to pain;Unable to fully assess due to immobilization RUE Coordination: decreased fine motor;decreased gross motor    Lower Extremity Assessment Lower Extremity  Assessment: RLE deficits/detail;LLE deficits/detail RLE Deficits / Details: s/p BKA revision, wound vac in place RLE: Unable to fully assess due to pain LLE Deficits / Details: h/o L BKA (2016)    Cervical / Trunk Assessment Cervical / Trunk Assessment: Kyphotic  Communication   Communication: No difficulties  Cognition Arousal/Alertness: Awake/alert Behavior During Therapy: Flat affect Overall Cognitive Status: No family/caregiver present to determine baseline cognitive functioning                                 General Comments: Pt with very flat affect. Delayed responses. Trails off to silence in the middle of responses. Question reliability as history provided different from previous admission.      General Comments General comments (skin integrity, edema, etc.): elevated R UE for edema management    Exercises     Assessment/Plan    PT Assessment Patient needs continued PT services  PT Problem List Decreased strength;Decreased mobility;Decreased activity tolerance;Pain;Decreased balance       PT Treatment Interventions DME instruction;Therapeutic activities;Therapeutic exercise;Balance training;Functional mobility training;Patient/family education    PT Goals (Current goals can be found in the Care Plan section)  Acute Rehab PT Goals Patient Stated Goal: to return home PT Goal Formulation: With patient Time For Goal Achievement: 11/08/18 Potential to Achieve Goals: Fair    Frequency Min 2X/week   Barriers to discharge        Co-evaluation PT/OT/SLP Co-Evaluation/Treatment: Yes Reason for Co-Treatment: Complexity of the patient's impairments (multi-system involvement);For patient/therapist safety;To address functional/ADL transfers PT goals addressed during session: Mobility/safety with mobility;Balance OT goals addressed during session: ADL's and self-care;Proper use of Adaptive equipment and DME;Strengthening/ROM       AM-PAC PT "6 Clicks"  Mobility  Outcome Measure Help needed turning from your back to your side while in a flat bed without using bedrails?: A Little Help needed moving from lying on your back to sitting on the side of a flat bed without using bedrails?: A Lot Help needed moving to and from a bed to a chair (including a wheelchair)?: A Lot Help needed standing up from a chair using your arms (e.g., wheelchair or bedside chair)?: A Lot Help needed to walk in hospital room?: Total Help needed climbing 3-5 steps with a railing? : Total 6 Click Score: 11    End of Session   Activity Tolerance: Patient limited by pain Patient left: in bed;with call bell/phone within reach;with bed alarm set Nurse Communication: Mobility status PT Visit Diagnosis: Other abnormalities of gait and mobility (R26.89);Pain Pain - Right/Left: Right Pain - part of body: Hand    Time: 1459-1521 PT Time Calculation (min) (ACUTE ONLY): 22 min   Charges:   PT Evaluation $PT Eval Moderate Complexity: 1 Mod          Lorrin Goodell, PT  Office # [phone] Pager [phone]   Lorriane Shire 10/25/2018, 4:35 PM

## 2018-10-26 LAB — GLUCOSE, CAPILLARY
Glucose-Capillary: 88 mg/dL (ref 70–99)
Glucose-Capillary: 95 mg/dL (ref 70–99)
Glucose-Capillary: 118 mg/dL — ABNORMAL HIGH (ref 70–99)
Glucose-Capillary: 137 mg/dL — ABNORMAL HIGH (ref 70–99)

## 2018-10-26 NOTE — Plan of Care (Signed)

## 2018-10-26 NOTE — Progress Notes (Signed)
Subjective: 2 Days Post-Op Procedure(s) (LRB): REVISION RIGHT BELOW KNEE AMPUTATION (Right) Patient reports pain as mild.  Right wrist pain and redness improved.  Minimal pain to RLE.    Objective: Vital signs in last 24 hours: Temp:  [98.5 F (36.9 C)-99.1 F (37.3 C)] 98.5 F (36.9 C) (06/21 0427) Pulse Rate:  [81-88] 81 (06/21 0427) BP: (119-131)/(56-66) 131/56 (06/21 0427) SpO2:  [92 %-94 %] 92 % (06/21 0427)  Intake/Output from previous day: 06/20 0701 - 06/21 0700 In: 720 [P.O.:720] Out: 2000 [Urine:2000] Intake/Output this shift: No intake/output data recorded.  Recent Labs    10/24/18 0714  HGB 8.8*   Recent Labs    10/24/18 0714  WBC 12.6*  RBC 3.20*  HCT 28.1*  PLT 196   Recent Labs    10/24/18 0714  NA 133*  K 4.2  CL 104  CO2 22  BUN 14  CREATININE 1.06  GLUCOSE 291*  CALCIUM 8.1*   Recent Labs    10/25/18 0555  INR 1.4*    Neurologically intact  RLE- wound vac in place and working.  No fluid in canister R wrist- still swollen with mild erythema.     Assessment/Plan: 2 Days Post-Op Procedure(s) (LRB): REVISION RIGHT BELOW KNEE AMPUTATION (Right) Up with therapy  RLE- NWB. continue wound vac.   Right wrist-  Uric acid wnl.  Pain and erythema subjectively improving.  Plan for d/c home tomorrow      Aundra Dubin 10/26/2018, 8:33 AM

## 2018-10-26 NOTE — Anesthesia Postprocedure Evaluation (Signed)
Anesthesia Post Note  Patient: [name]  Procedure(s) Performed: REVISION RIGHT BELOW KNEE AMPUTATION (Right Leg Lower)     Patient location during evaluation: PACU Anesthesia Type: General Level of consciousness: awake and alert Pain management: pain level controlled Vital Signs Assessment: post-procedure vital signs reviewed and stable Respiratory status: spontaneous breathing, nonlabored ventilation, respiratory function stable and patient connected to nasal cannula oxygen Cardiovascular status: blood pressure returned to baseline and stable Postop Assessment: no apparent nausea or vomiting Anesthetic complications: no    Last Vitals:  Vitals:   10/25/18 2030 10/26/18 0427  BP: 130/66 (!) 131/56  Pulse: 88 81  Resp:    Temp: 37.3 C 36.9 C  SpO2: 94% 92%    Last Pain:  Vitals:   10/26/18 0427  TempSrc: Oral  PainSc:                  Emireth Cockerham L Yanely Mast

## 2018-10-26 NOTE — Progress Notes (Signed)
Occupational Therapy Treatment Patient Details Name: [name] MRN: [id number] DOB: 1955/10/04 Today's Date: 10/26/2018    History of present illness Pt is a 63 y.o. 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 PMH consists of R BKA (01/2018), L BKA (2016), PVD, HTN, and CKD.   OT comments  Patient supine in bed, agreeable to R UE exercise.  Reports significant improvement in edema to R UE, since yesterday.  Continues to be limited by decreased digit AROM and contractures, pt inconsistent when describing typical ROM PTA during session.  Able to stretch and increased ROM of 2nd digit, educated on passive stretch and exercises to complete 3x/day- pt agreeable.  Will follow acutely.     Follow Up Recommendations  SNF    Equipment Recommendations  Other (comment)(sliding board/home aide if going home)    Recommendations for Other Services      Precautions / Restrictions Precautions Precautions: Fall;Other (comment) Precaution Comments: wound vac R residual limb Restrictions Weight Bearing Restrictions: Yes RLE Weight Bearing: Non weight bearing       Mobility Bed Mobility                  Transfers                      Balance                                           ADL either performed or assessed with clinical judgement   ADL Overall ADL's : Needs assistance/impaired                                       General ADL Comments: session focused on R UE ROM, discussed functional use PTA and pt reports doing majority of tasks with L hand only     Vision       Perception     Praxis      Cognition Arousal/Alertness: Awake/alert Behavior During Therapy: Flat affect Overall Cognitive Status: No family/caregiver present to determine baseline cognitive functioning                                 General Comments: flat affect, inconsistent reponses during session (when asking about typical  functional use of R hand)         Exercises Exercises: General Upper Extremity;Hand exercises;Other exercises General Exercises - Upper Extremity Elbow Flexion: AROM;10 reps;Right;Supine Elbow Extension: AROM;Right;10 reps;Supine Wrist Flexion: AROM;Right;10 reps;Supine Wrist Extension: AROM;Right;10 reps;Supine Digit Composite Flexion: AROM;Right;10 reps;Supine Composite Extension: AROM;Right;10 reps;Supine(limited due to hand flexion contractures) Hand Exercises Forearm Supination: AROM;Right;10 reps;Supine Forearm Pronation: AROM;Right;10 reps;Supine Wrist Ulnar Deviation: AROM;Right;10 reps;Supine Wrist Radial Deviation: AROM;Right;10 reps;Supine Thumb Abduction: AROM;Right;10 reps;Supine Thumb Adduction: AROM;Right;10 reps;Supine Opposition: AROM;Right;10 reps;Supine(motion using thumb) Other Exercises Other Exercises: PROM provided to 2nd digit x 10 reps into extension with slight stretch (pt unable to tolerate PROM/stretch of other digits)  Other Exercises: educated on exercise program 3x/day: wrist flexion/extension, gross hand extension/flexion, and using edema elevation in order to provide passive stretch of digits    Shoulder Instructions       General Comments R UE remains elevated for edema mgmt     Pertinent Vitals/ Pain  Pain Assessment: Faces Faces Pain Scale: Hurts a little bit Pain Location: R hand Pain Descriptors / Indicators: Guarding;Grimacing Pain Intervention(s): Monitored during session;Repositioned  Home Living                                          Prior Functioning/Environment              Frequency  Min 3X/week        Progress Toward Goals  OT Goals(current goals can now be found in the care plan section)  Progress towards OT goals: Progressing toward goals  Acute Rehab OT Goals Patient Stated Goal: to return home OT Goal Formulation: With patient  Plan Discharge plan remains appropriate;Frequency  remains appropriate    Co-evaluation                 AM-PAC OT "6 Clicks" Daily Activity     Outcome Measure   Help from another person eating meals?: A Lot Help from another person taking care of personal grooming?: A Lot Help from another person toileting, which includes using toliet, bedpan, or urinal?: A Lot Help from another person bathing (including washing, rinsing, drying)?: A Lot Help from another person to put on and taking off regular upper body clothing?: A Lot Help from another person to put on and taking off regular lower body clothing?: Total 6 Click Score: 11    End of Session    OT Visit Diagnosis: Unsteadiness on feet (R26.81);Muscle weakness (generalized) (M62.81)   Activity Tolerance Patient tolerated treatment well   Patient Left in bed;with call bell/phone within reach;with bed alarm set   Nurse Communication Mobility status        Time: 7628-3151 OT Time Calculation (min): 14 min  Charges: OT General Charges $OT Visit: 1 Visit OT Treatments $Therapeutic Exercise: 8-22 mins  Delight Stare, [place] Pager [phone] Office [phone]    Delight Stare 10/26/2018, 12:48 PM

## 2018-10-27 ENCOUNTER — Encounter (HOSPITAL_COMMUNITY): Payer: Self-pay | Admitting: Orthopedic Surgery

## 2018-10-27 LAB — GLUCOSE, CAPILLARY
Glucose-Capillary: 92 mg/dL (ref 70–99)
Glucose-Capillary: 139 mg/dL — ABNORMAL HIGH (ref 70–99)
Glucose-Capillary: 154 mg/dL — ABNORMAL HIGH (ref 70–99)
Glucose-Capillary: 152 mg/dL — ABNORMAL HIGH (ref 70–99)

## 2018-10-27 NOTE — Plan of Care (Signed)

## 2018-10-27 NOTE — Progress Notes (Signed)
Inpatient Rehab Admissions:  Inpatient Rehab Consult received.  I met with patient at the bedside for rehabilitation assessment and to discuss goals and expectations of an inpatient rehab admission.  He is currently performing mobility at supervision or better and PT recommendations updated to home with [facility] follow up.  He is okay with this plan. I will sign off and let CM know.   Signed: Shann Medal, PT, DPT Admissions Coordinator [phone] 10/27/18  1:12 PM

## 2018-10-27 NOTE — Progress Notes (Signed)
Patient ID: [name], male   DOB: 01-12-1956, 63 y.o.   MRN: [id number] Patient is status post revision transtibial amputation.  Therapy has recommended discharge to skilled nursing however patient feels like he would be more comfortable and safe discharge to home.  We will see how he progresses with therapy today.  Patient states he is improving function with his right hand the redness is resolving.  His uric acid was 3.6.  There is no drainage in the wound VAC canister.  The cultures are pending.  Anticipate discharge on oral antibiotics when cultures are finalized.

## 2018-10-27 NOTE — Progress Notes (Signed)
Physical Therapy Treatment Patient Details Name: [name] MRN: [id number] DOB: 10-26-1955 Today's Date: 10/27/2018    History of Present Illness Pt is a 63 y.o. 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 PMH consists of R BKA (01/2018), L BKA (2016), PVD, HTN, and CKD.    PT Comments    Pt with improved cognition and mobility this session. Suspect confusion on eval due to anesthesia/pain meds. Pt performed bed mobility supervision. He donned L prosthesis EOB for bed <> w/c transfer min guard assist. Pt demonstrated good safety with transfer. He clarified the discrepancy regarding his home set up (I.e. w/c not fitting in all rooms of his house) by informing PT that he has moved since his Sept 2019 admission. His new house is completely w/c accessible. Pt reports several family members live nearby and will be checking on him daily. Discharge recommendations updated to HHPT.    Follow Up Recommendations  Home health PT;Supervision for mobility/OOB     Equipment Recommendations  None recommended by PT    Recommendations for Other Services       Precautions / Restrictions Precautions Precautions: Fall;Other (comment) Precaution Comments: wound vac R residual limb Restrictions RLE Weight Bearing: Non weight bearing    Mobility  Bed Mobility Overal bed mobility: Needs Assistance Bed Mobility: Rolling;Supine to Sit;Sit to Supine Rolling: Modified independent (Device/Increase time)   Supine to sit: HOB elevated;Supervision Sit to supine: Supervision   General bed mobility comments: +rail, no assist, increased time and effort  Transfers Overall transfer level: Needs assistance Equipment used: None Transfers: Lateral/Scoot Transfers          Lateral/Scoot Transfers: Min guard General transfer comment: lateral transfer bed <> wheelchair, increased time and effort, no physical assist, Pt demo safe technique. L prosthesis donned for transfer.  Ambulation/Gait             General Gait Details: unable   Stairs             Wheelchair Mobility    Modified Rankin (Stroke Patients Only)       Balance Overall balance assessment: Needs assistance Sitting-balance support: Single extremity supported;Feet unsupported Sitting balance-Leahy Scale: Fair Sitting balance - Comments: UE support to maintain sitting balance                                    Cognition Arousal/Alertness: Awake/alert Behavior During Therapy: WFL for tasks assessed/performed Overall Cognitive Status: Within Functional Limits for tasks assessed                                 General Comments: Pt more clear today. A&O x 3. More interactive. Appropriate. Suspect confusion from anesthesia/pain meds on initial eval.      Exercises      General Comments        Pertinent Vitals/Pain Pain Assessment: Faces Faces Pain Scale: Hurts a little bit Pain Location: R hand Pain Descriptors / Indicators: Guarding;Grimacing Pain Intervention(s): Monitored during session;Repositioned    Home Living                      Prior Function            PT Goals (current goals can now be found in the care plan section) Acute Rehab PT Goals Patient Stated Goal: to return home PT Goal  Formulation: With patient Time For Goal Achievement: 11/08/18 Potential to Achieve Goals: Good Progress towards PT goals: Progressing toward goals    Frequency    Min 3X/week      PT Plan Discharge plan needs to be updated;Frequency needs to be updated    Co-evaluation              AM-PAC PT "6 Clicks" Mobility   Outcome Measure  Help needed turning from your back to your side while in a flat bed without using bedrails?: None Help needed moving from lying on your back to sitting on the side of a flat bed without using bedrails?: None Help needed moving to and from a bed to a chair (including a wheelchair)?: A Little Help needed standing  up from a chair using your arms (e.g., wheelchair or bedside chair)?: A Lot Help needed to walk in hospital room?: A Lot Help needed climbing 3-5 steps with a railing? : Total 6 Click Score: 16    End of Session Equipment Utilized During Treatment: Gait belt Activity Tolerance: Patient tolerated treatment well Patient left: in bed;with call bell/phone within reach Nurse Communication: Mobility status;Other (comment)(IV pulled out during transfer) PT Visit Diagnosis: Other abnormalities of gait and mobility (R26.89);Pain Pain - Right/Left: Right Pain - part of body: Hand     Time: 4270-6237 PT Time Calculation (min) (ACUTE ONLY): 28 min  Charges:  $Therapeutic Activity: 23-37 mins                     Lorrin Goodell, PT  Office # [phone] Pager [phone]    Lorriane Shire 10/27/2018, 9:39 AM

## 2018-10-27 NOTE — Care Management Important Message (Signed)
Important Message  Patient Details  Name: [name] MRN: [id number] Date of Birth: 20-Jun-1955   Medicare Important Message Given:  Yes     Memory Argue 10/27/2018, 3:42 PM

## 2018-10-27 NOTE — Plan of Care (Signed)

## 2018-10-28 ENCOUNTER — Telehealth: Payer: Self-pay | Admitting: Radiology

## 2018-10-28 ENCOUNTER — Telehealth: Payer: Self-pay

## 2018-10-28 LAB — GLUCOSE, CAPILLARY: Glucose-Capillary: 86 mg/dL (ref 70–99)

## 2018-10-28 MED ORDER — AMOXICILLIN-POT CLAVULANATE 875-125 MG PO TABS: 1.0000 | ORAL_TABLET | Freq: Two times a day (BID) | ORAL | 0 refills | Status: DC

## 2018-10-28 NOTE — Plan of Care (Signed)

## 2018-10-28 NOTE — Discharge Summary (Signed)
Discharge Diagnoses:  Active Problems:   Dehiscence of amputation stump ([place])   Abscess of right leg   Surgery follow-up   Acquired absence of right lower extremity below knee [facility])   Surgeries: Procedure(s): REVISION RIGHT BELOW KNEE AMPUTATION on 10/24/2018    Consultants:   Discharged Condition: Improved  Hospital Course: [name] is an 63 y.o. male who was admitted 10/24/2018 with a chief complaint of abscess right BKA, with a final diagnosis of Osteomyelitis Right Below Knee Amputation.  Patient was brought to the operating room on 10/24/2018 and underwent Procedure(s): REVISION RIGHT BELOW KNEE AMPUTATION.    Patient was given perioperative antibiotics:  Anti-infectives (From admission, onward)   Start     Dose/Rate Route Frequency Ordered Stop   10/24/18 1615  clindamycin (CLEOCIN) IVPB 600 mg     600 mg 100 mL/hr over 30 Minutes Intravenous Every 8 hours 10/24/18 1606     10/24/18 1600  ceFAZolin (ANCEF) IVPB 1 g/50 mL premix  Status:  Discontinued     1 g 100 mL/hr over 30 Minutes Intravenous Every 6 hours 10/24/18 1551 10/24/18 1606   10/24/18 0645  clindamycin (CLEOCIN) IVPB 900 mg  Status:  Discontinued     900 mg 100 mL/hr over 30 Minutes Intravenous On call to O.R. 10/24/18 0630 10/24/18 1537    .  Patient was given sequential compression devices, early ambulation, and aspirin for DVT prophylaxis.  Recent vital signs:  Patient Vitals for the past 24 hrs:  BP Temp Temp src Pulse Resp SpO2  10/28/18 0401 123/70 98.2 F (36.8 C) Oral 72 16 98 %  10/27/18 1943 124/62 98.1 F (36.7 C) Oral 80 17 96 %  10/27/18 1602 (!) 125/52 99.2 F (37.3 C) Oral 81 16 93 %  10/27/18 0751 (!) 133/56 98.7 F (37.1 C) Oral 86 16 94 %  .  Recent laboratory studies: No results found.  Discharge Medications:   Allergies as of 10/28/2018      Reactions   Ace Inhibitors Cough   Cefazolin    Possible cause of AKI, AIN      Medication List    STOP taking these  medications   warfarin 2 MG tablet Commonly known as: COUMADIN     TAKE these medications   amLODipine 2.5 MG tablet Commonly known as: NORVASC Take 2.5 mg by mouth daily.   Calcium Citrate + D3 250-200 MG-UNIT Tabs Generic drug: Calcium Citrate-Vitamin D Take 1 tablet by mouth 2 (two) times daily.   cholestyramine 4 g packet Commonly known as: QUESTRAN Take 1 packet by mouth every morning.   doxycycline 100 MG tablet Commonly known as: VIBRA-TABS Take 1 tablet (100 mg total) by mouth 2 (two) times daily.   insulin glargine 100 UNIT/ML injection Commonly known as: LANTUS Inject 0.1 mLs (10 Units total) into the skin at bedtime. What changed: how much to take   latanoprost 0.005 % ophthalmic solution Commonly known as: XALATAN Place 1 drop into both eyes at bedtime.   metoprolol tartrate 50 MG tablet Commonly known as: LOPRESSOR Take 1 tablet (50 mg total) by mouth 2 (two) times daily.   pantoprazole 40 MG tablet Commonly known as: PROTONIX Take 40 mg by mouth daily.   simvastatin 20 MG tablet Commonly known as: ZOCOR Take 20 mg by mouth daily at 6 PM.       Diagnostic Studies: Xr Forearm Right  Result Date: 10/22/2018 2 view radiographs of the right forearm and wrist shows significant  calcification of the radial and ulnar arteries.  Patient has no bony abnormalities.  No fractures.  Xr Tibia/fibula Right  Result Date: 10/22/2018 2 view radiographs of the right transtibial amputation shows calcification of the popliteal vessels the knee is congruent there is destructive bony changes over the distal tibia and fibula consistent with subacute osteomyelitis.  Xr Wrist 2 Views Right  Result Date: 10/22/2018 2 view radiographs of the right forearm and wrist shows significant calcification of the radial and ulnar arteries.  Patient has no bony abnormalities.  No fractures.   Patient benefited maximally from their hospital stay and there were no complications.      Disposition: Discharge disposition: 01-Home or Self Care      Discharge Instructions    Call MD / Call 911   Complete by: As directed    If you experience chest pain or shortness of breath, CALL 911 and be transported to the hospital emergency room.  If you develope a fever above 101 F, pus (white drainage) or increased drainage or redness at the wound, or calf pain, call your surgeon's office.   Constipation Prevention   Complete by: As directed    Drink plenty of fluids.  Prune juice may be helpful.  You may use a stool softener, such as Colace (over the counter) 100 mg twice a day.  Use MiraLax (over the counter) for constipation as needed.   Diet - low sodium heart healthy   Complete by: As directed    Increase activity slowly as tolerated   Complete by: As directed    Negative Pressure Wound Therapy - Incisional   Complete by: As directed      Follow-up Information    Newt Minion, MD In 1 week.   Specialty: Orthopedic Surgery Contact information: [address] [place] [address] [phone]            Signed: Newt Minion 10/28/2018, 7:08 AM

## 2018-10-28 NOTE — Plan of Care (Signed)
  Problem: Pain Managment: Goal: General experience of comfort will improve Outcome: Progressing   Problem: Safety: Goal: Ability to remain free from injury will improve Outcome: Progressing   Problem: Skin Integrity: Goal: Risk for impaired skin integrity will decrease Outcome: Progressing   

## 2018-10-28 NOTE — Plan of Care (Signed)
  Problem: Education: Goal: Knowledge of General Education information will improve Description: Including pain rating scale, medication(s)/side effects and non-pharmacologic comfort measures 10/28/2018 1121 by Chaney Malling, RN Outcome: Adequate for Discharge 10/28/2018 1121 by Chaney Malling, RN Outcome: Adequate for Discharge   Problem: Health Behavior/Discharge Planning: Goal: Ability to manage health-related needs will improve 10/28/2018 1121 by Chaney Malling, RN Outcome: Adequate for Discharge 10/28/2018 1121 by Chaney Malling, RN Outcome: Adequate for Discharge   Problem: Clinical Measurements: Goal: Ability to maintain clinical measurements within normal limits will improve 10/28/2018 1121 by Chaney Malling, RN Outcome: Adequate for Discharge 10/28/2018 1121 by Chaney Malling, RN Outcome: Adequate for Discharge Goal: Will remain free from infection 10/28/2018 1121 by Chaney Malling, RN Outcome: Adequate for Discharge 10/28/2018 1121 by Chaney Malling, RN Outcome: Adequate for Discharge Goal: Diagnostic test results will improve 10/28/2018 1121 by Chaney Malling, RN Outcome: Adequate for Discharge 10/28/2018 1121 by Chaney Malling, RN Outcome: Adequate for Discharge Goal: Respiratory complications will improve 10/28/2018 1121 by Chaney Malling, RN Outcome: Adequate for Discharge 10/28/2018 1121 by Chaney Malling, RN Outcome: Adequate for Discharge Goal: Cardiovascular complication will be avoided 10/28/2018 1121 by Chaney Malling, RN Outcome: Adequate for Discharge 10/28/2018 1121 by Chaney Malling, RN Outcome: Adequate for Discharge   Problem: Activity: Goal: Risk for activity intolerance will decrease 10/28/2018 1121 by Chaney Malling, RN Outcome: Adequate for Discharge 10/28/2018 1121 by Chaney Malling, RN Outcome: Adequate for Discharge   Problem: Nutrition: Goal: Adequate nutrition will be maintained 10/28/2018 1121 by Chaney Malling, RN Outcome: Adequate for  Discharge 10/28/2018 1121 by Chaney Malling, RN Outcome: Adequate for Discharge   Problem: Coping: Goal: Level of anxiety will decrease 10/28/2018 1121 by Chaney Malling, RN Outcome: Adequate for Discharge 10/28/2018 1121 by Chaney Malling, RN Outcome: Adequate for Discharge   Problem: Elimination: Goal: Will not experience complications related to bowel motility 10/28/2018 1121 by Chaney Malling, RN Outcome: Adequate for Discharge 10/28/2018 1121 by Chaney Malling, RN Outcome: Adequate for Discharge Goal: Will not experience complications related to urinary retention 10/28/2018 1121 by Chaney Malling, RN Outcome: Adequate for Discharge 10/28/2018 1121 by Chaney Malling, RN Outcome: Adequate for Discharge   Problem: Pain Managment: Goal: General experience of comfort will improve 10/28/2018 1121 by Chaney Malling, RN Outcome: Adequate for Discharge 10/28/2018 1121 by Chaney Malling, RN Outcome: Adequate for Discharge   Problem: Safety: Goal: Ability to remain free from injury will improve 10/28/2018 1121 by Chaney Malling, RN Outcome: Adequate for Discharge 10/28/2018 1121 by Chaney Malling, RN Outcome: Adequate for Discharge   Problem: Skin Integrity: Goal: Risk for impaired skin integrity will decrease 10/28/2018 1121 by Chaney Malling, RN Outcome: Adequate for Discharge 10/28/2018 1121 by Chaney Malling, RN Outcome: Adequate for Discharge

## 2018-10-28 NOTE — TOC Transition Note (Addendum)
Transition of Care [facility]) - CM/SW Discharge Note   Patient Details  Name: [name] MRN: [id number] Date of Birth: 06/22/1955  Transition of Care [facility]) CM/SW Contact:  Ninfa Meeker, RN Phone Number: [phone] (working remotely) 10/28/2018, 11:13 AM   Clinical Narrative:   63 yr old gentleman admitted for dehiscence of right BKA. 6/21 patient underwent revision of right BKA.  Case manager spoke with patient concerning discharge plan. Choice for home health agency was offered. Patient says he lives in [place], [place] and gave Case Manager permission to locate a [place] agency that covers that area. CM called referral to Joen Laura, [facility], at 1030 and asked that they check to see if they go to patient's area.  [facility] responded and says they can accept patient. CM informed patient of same. Patient says he has RW and wheelchair at home, he lives alone but says he has family and friends that will come and assist if needed.  1115: Case manager discovered patient was setup on 6/20 with [facility]. Patient was not aware. CM contacted Joen Laura and canceled referral.   Final next level of care: [place] Barriers to Discharge: No Barriers Identified   Patient Goals and CMS Choice Patient states their goals for this hospitalization and ongoing recovery are:: to get well CMS Medicare.gov Compare Post Acute Care list provided to:: Patient(offered verbally via telephone. CM working remotely d/t COVID) Choice offered to / list presented to : Patient  Discharge Placement                       Discharge Plan and Services In-house Referral: NA Discharge Planning Services: CM Consult Post Acute Care Choice: Home Health          DME Arranged: N/A         HH Arranged: PT HH Agency: [facility] [employer] Date [place]: 10/28/18 Time [place]: 1030 Representative spoke with at IXL: [place] ([place]) Interventions     Readmission Risk Interventions No flowsheet data found.

## 2018-10-28 NOTE — Telephone Encounter (Signed)
I called pt per Dr. Sharol Given to advise that his cultures came back and that he called in rx for Augmentin to the [facility] in [place] and pt verified that this is where he wanted it to be faxed. Will call with questions otherwise has an appt on 11/05/18 for follow up.

## 2018-10-28 NOTE — Telephone Encounter (Signed)
Phone answering service called  Nyra Market from [place] Left message that patients disease is resistant to antibiotic prescribed by Dr. Sharol Given. Patient is a inpatient.   Please call back at [phone]

## 2018-10-29 LAB — AEROBIC/ANAEROBIC CULTURE W GRAM STAIN (SURGICAL/DEEP WOUND): Gram Stain: NONE SEEN

## 2018-10-30 ENCOUNTER — Telehealth: Payer: Self-pay

## 2018-10-30 ENCOUNTER — Inpatient Hospital Stay: Payer: BC Managed Care – PPO | Admitting: Orthopedic Surgery

## 2018-11-04 NOTE — Telephone Encounter (Signed)
ERROR

## 2018-11-05 ENCOUNTER — Other Ambulatory Visit: Payer: Self-pay

## 2018-11-05 ENCOUNTER — Inpatient Hospital Stay: Payer: BC Managed Care – PPO | Admitting: Orthopedic Surgery

## 2018-11-05 ENCOUNTER — Ambulatory Visit (INDEPENDENT_AMBULATORY_CARE_PROVIDER_SITE_OTHER): Payer: Medicare Other | Admitting: Family

## 2018-11-05 DIAGNOSIS — Z89511 Acquired absence of right leg below knee: Secondary | ICD-10-CM

## 2018-11-05 MED ORDER — NAPROXEN 500 MG PO TABS: 500.0000 mg | ORAL_TABLET | Freq: Two times a day (BID) | ORAL | 0 refills | Status: DC | PRN

## 2018-11-05 MED ORDER — SILVER SULFADIAZINE 1 % EX CREA: 1.0000 "application " | TOPICAL_CREAM | Freq: Every day | CUTANEOUS | 0 refills | Status: DC

## 2018-11-10 ENCOUNTER — Telehealth: Payer: Self-pay | Admitting: Orthopedic Surgery

## 2018-11-10 NOTE — Telephone Encounter (Signed)
Called and sw [name] to advise silvadene dressing change daily to open are an BKA per erin. They will come out twice a week to monitor and change dressing and pt will do it on the other days. To call with any questions.

## 2018-11-10 NOTE — Telephone Encounter (Signed)
Stacy with [facility] called in wanting to get clarification on the pt wound order and also said they dropped to 1x a week for this week and 2x a week for after or if needed more please let her know.  Also said a cream has been sent in for the pt but she doesn't have a order to put it on the pt.   [phone]

## 2018-11-11 ENCOUNTER — Encounter: Payer: Self-pay | Admitting: Family

## 2018-11-11 NOTE — Progress Notes (Signed)
Post-Op Visit Note   Patient: [name]           Date of Birth: [birth year]           MRN: [id number] Visit Date: 11/05/2018 PCP: Patient, No Pcp Per  Chief Complaint: No chief complaint on file.   HPI:  HPI The patient is a 63 year old gentleman seen today status post revision of his right below the knee amputation on June 19.  Home health nursing did discontinue the VAC a week ago.  He will be working with [employer] for his prosthesis needs.  A small area in the middle of his incision has dehisced.  Complains of some brown drainage.  Ortho Exam On examination of the right below the knee amputation this is well approximated with staples however there is one area about 15 mm long that has popped open this is 3 mm deep there is fibrinous tissue in the wound bed this does not probe to bone or tendon.  There is no purulence no surrounding erythema no maceration no sign of infection  Visit Diagnoses: No diagnosis found.  Plan: Continue with daily Dial soap cleansing.  May apply a drop of Silvadene to the area that has dehisced.  We will follow-up in 2 weeks for suture removal continue with shrinker around-the-clock.  Follow-Up Instructions: Return in about 2 weeks (around 11/19/2018).   Imaging: No results found.  Orders:  No orders of the defined types were placed in this encounter.  Meds ordered this encounter  Medications  . DISCONTD: naproxen (NAPROSYN) 500 MG tablet    Sig: Take 1 tablet (500 mg total) by mouth 2 (two) times daily as needed for mild pain or moderate pain. With meals    Dispense:  60 tablet    Refill:  0  . naproxen (NAPROSYN) 500 MG tablet    Sig: Take 1 tablet (500 mg total) by mouth 2 (two) times daily as needed for mild pain or moderate pain. With meals    Dispense:  60 tablet    Refill:  0  . silver sulfADIAZINE (SILVADENE) 1 % cream    Sig: Apply 1 application topically daily.    Dispense:  50 g    Refill:  0     PMFS History: Patient  Active Problem List   Diagnosis Date Noted  . Surgery follow-up 10/24/2018  . Acquired absence of right lower extremity below knee ([place]) 10/24/2018  . Dehiscence of amputation stump ([place])   . Abscess of right leg   . Encounter for removal of biliary stent   . Bile leak   . Bile leak, postoperative   . Acute gangrenous cholecystitis s/p lap cholecystectomy 02/28/2018 03/02/2018  . AKI (acute kidney injury) ([place])   . Acute hypoxemic respiratory failure ([place]) 02/06/2018  . PAF (paroxysmal atrial fibrillation) ([place]) 02/05/2018  . Aortic atherosclerosis ([place]) 02/05/2018  . Acute renal failure (ARF) ([place]) 01/27/2018  . Hyponatremia 01/27/2018  . 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 S/P bilateral BKA (below knee amputation) ([place])   . Post-operative pain   . Benign essential HTN   . Diabetes mellitus type 2 in obese ([place])   . PVD (peripheral vascular disease) ([place])   . History of prosthetic mitral valve   . Sepsis ([place]) 01/15/2018  . Prosthetic valve endocarditis ([place])   . Staphylococcus aureus bacteremia   . Staphylococcal arthritis of right wrist ([place])   . Acute hematogenous osteomyelitis, right ankle and foot ([place])   . Splenic  infarct 01/14/2018  . Renal insufficiency 01/14/2018  . Severe sepsis ([place]) 01/14/2018  . Dupuytren's disease of palm of both hands 03/18/2017  . S/P BKA (below knee amputation) unilateral, right ([place]) 12/08/2014  . Bicuspid aortic valve   . Hyperlipidemia   . Hypertension   . Greater trochanter fracture ([place]) 07/28/2014  . Hip fracture, right ([place]) 07/28/2014  . HLD (hyperlipidemia) 07/28/2014  . GERD (gastroesophageal reflux disease) 07/28/2014  . Hip fracture ([place]) 07/28/2014  . Fall 07/28/2014  . Leukocytosis 07/28/2014  . Thrombocytopenia ([place]) 07/28/2014  . H/O aortic valve replacement 10/16/2011  . 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 PLEURAL EFFUSION 05/25/2009  . OPEN WOUND OF CHEST , COMPLICATED 84/13/2440  . PROSTHETIC VALVE  - BIO/ ENDOPROSTHESIS 05/25/2009  . Insulin-requiring or dependent type II diabetes mellitus ([place]) 04/19/2009  . HYPERLIPIDEMIA 04/19/2009  . Essential hypertension 04/19/2009  . CAD (coronary artery disease) 04/19/2009  . Peripheral vascular disease ([place]) 04/19/2009  . OSTEOMYELITIS 04/19/2009  . BICUSPID AORTIC VALVE 04/19/2009   Past Medical History:  Diagnosis Date  . 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 Acute and subacute infective endocarditis   . Bicuspid aortic valve    a. 02/2009 s/p tissue AVR;  b. 07/2014 Echo: EF 55-60%, basal-mid inf HK, mild AS/MR, mod dil LA, PASP 44 mmHg.  Marland Kitchen CAD (coronary artery disease) October 2010   a. 02/2009 NSTEMI/Cath: 3VD; b. 02/2009 CABG x 5: LIMA->LAD, VG->Diag, VG->RI, VG->RVM->RPDA.  . Cellulitis of left lower extremity 04/26/2016  . Chronic kidney disease    stage 5 - not on dialysis  Kidney failure due antibiotic for bad infection, kidneys are working again per pt  . Gangrene of gallbladder in cholecystitis   . GERD (gastroesophageal reflux disease)   . Heart murmur   . Hyperlipidemia   . Hypertension   . Myocardial infarction ([place]) 2010  . PAF (paroxysmal atrial fibrillation) ([place])    02/2018  . PVD (peripheral vascular disease) ([place])    a. s/p toe amputations on L foot.  . Right foot infection 01/15/2018  . Sepsis due to methicillin susceptible Staphylococcus aureus ([place])   . Type II diabetes mellitus (HCC)     Family History  Problem Relation Age of Onset  . Heart attack Father        early age  . Heart disease Father   . Stroke Father   . Heart attack Brother        At age 63  . Stroke Brother   . Diabetes type II Mother   . Diabetes type II Sister   . Diabetes Other   . Coronary artery disease Other   . Colon cancer Neg Hx   . Esophageal cancer Neg Hx     Past Surgical History:  Procedure Laterality Date  . AMPUTATION Left 2011   great and second toes  . AMPUTATION  07/27/2011    Procedure: AMPUTATION FOOT;  Surgeon: Newt Minion, MD;  Location: [facility];  Service: Orthopedics;  Laterality: Left;  Left Midfoot Amputation   . AMPUTATION Left 12/08/2014   Procedure: Left Below Knee Amputation;  Surgeon: Newt Minion, MD;  Location: [facility];  Service: Orthopedics;  Laterality: Left;  . AMPUTATION Right 01/17/2018   Procedure: RIGHT BELOW KNEE AMPUTATION;  Surgeon: Newt Minion, MD;  Location: [facility];  Service: Orthopedics;  Laterality: Right;  . AMPUTATION TOE Right    "all my toes on my right  foot have been amputated"  . AORTIC VALVE REPLACEMENT  2010   Pericardial tissue valve  . BILIARY STENT PLACEMENT  03/06/2018   Procedure: BILIARY STENT PLACEMENT;  Surgeon: Irene Shipper, MD;  Location[facility] ENDOSCOPY;  Service: Endoscopy;;  . CARDIAC CATHETERIZATION  2010  . CARDIAC VALVE REPLACEMENT    . CATARACT EXTRACTION W/ INTRAOCULAR LENS  IMPLANT, BILATERAL Bilateral   . CHOLECYSTECTOMY N/A 02/28/2018   Procedure: LAPAROSCOPIC CHOLECYSTECTOMY;  Surgeon: Coralie Keens, MD;  Location: [facility];  Service: General;  Laterality: N/A;  . CORONARY ARTERY BYPASS GRAFT  2010   "CABG X5"  . ERCP N/A 03/06/2018   Procedure: ENDOSCOPIC RETROGRADE CHOLANGIOPANCREATOGRAPHY (ERCP);  Surgeon: Irene Shipper, MD;  Location[facility] ENDOSCOPY;  Service: Endoscopy;  Laterality: N/A;  . ERCP N/A 05/26/2018   Procedure: ENDOSCOPIC RETROGRADE CHOLANGIOPANCREATOGRAPHY (ERCP);  Surgeon: Irene Shipper, MD;  Location: [facility] END[facility]COPY;  Service: Endoscopy;  Laterality: N/A;  . IR FLUORO GUIDE CV LINE RIGHT  02/13/2018  . IR REMOVAL TUN CV CATH W/O FL  02/24/2018  . IR US GUIDE VASC ACCESS RIGHT  02/13/2018  . REMOVAL OF STONES  03/06/2018   Procedure: REMOVAL OF STONES;  Surgeon: Irene Shipper, MD;  Location: [facility] ENDOSCOPY;  Service: Endoscopy;;  . Joan Mayans  03/06/2018   Procedure: Joan Mayans;  Surgeon: Irene Shipper, MD;  Location: [facility] ENDOSCOPY;  Service: Endoscopy;;  . Lavell Islam REMOVAL  05/26/2018    Procedure: STENT REMOVAL;  Surgeon: Irene Shipper, MD;  Location: [facility] ENDOSCOPY;  Service: Endoscopy;;  . STUMP REVISION Right 10/24/2018  . STUMP REVISION Right 10/24/2018   Procedure: REVISION RIGHT BELOW KNEE AMPUTATION;  Surgeon: Newt Minion, MD;  Location: [facility];  Service: Orthopedics;  Laterality: Right;  . TEE WITHOUT CARDIOVERSION N/A 01/21/2018   Procedure: TRANSESOPHAGEAL ECHOCARDIOGRAM (TEE);  Surgeon: Buford Dresser, MD;  Location: [facility] ENDOSCOPY;  Service: Cardiovascular;  Laterality: N/A;   Social History   Occupational History  . Occupation: Publishing rights manager: [employer]  Tobacco Use  . Smoking status: Former Smoker    Packs/day: 1.00    Years: 24.00    Pack years: 24.00    Types: Cigarettes    Quit date: 10/15/2000    Years since quitting: 18.0  . Smokeless tobacco: Never Used  Substance and Sexual Activity  . Alcohol use: Not Currently    Comment: 01/15/2018 "1-2 drinks/month"  . Drug use: No  . Sexual activity: Not Currently

## 2018-11-19 ENCOUNTER — Encounter: Payer: Self-pay | Admitting: Family

## 2018-11-19 ENCOUNTER — Other Ambulatory Visit: Payer: Self-pay

## 2018-11-19 ENCOUNTER — Ambulatory Visit (INDEPENDENT_AMBULATORY_CARE_PROVIDER_SITE_OTHER): Payer: Medicare Other | Admitting: Family

## 2018-11-19 VITALS — Ht 70.0 in | Wt 170.0 lb

## 2018-11-19 DIAGNOSIS — T8781 Dehiscence of amputation stump: Secondary | ICD-10-CM

## 2018-11-19 DIAGNOSIS — Z89511 Acquired absence of right leg below knee: Secondary | ICD-10-CM | POA: Diagnosis not present

## 2018-11-19 NOTE — Progress Notes (Signed)
Post-Op Visit Note   Patient: [name]           Date of Birth: 04/08/1956           MRN: [id number] Visit Date: 11/19/2018 PCP: Patient, No Pcp Per  Chief Complaint:  Chief Complaint  Patient presents with  . Right Leg - Routine Post Op    10/24/2018 Revision BKA right    HPI:  HPI The patient is a 63 year old gentleman seen today status post revision surgery for below the knee amputation on the right on June 19.  He has been doing Silvadene dressing changes weekly with home health.  Sutures do remain in place.  No fever no chills.  Of note he has been having resolving cellulitis of his right upper extremity continues to complain of swelling in his hand and wrist.  He has completed a two-week course of Augmentin this finished last Wednesday.  Was provided with a compression garment for his upper extremity 2 visits ago however patient was unable to tolerate wearing this felt it was too tight.  Complaining of stiffness in his hand.  Ortho Exam On examination of his right upper extremity he has pitting edema from the elbow to his fingers this is worse than his hand.  There is no erythema no warmth no open ulcerations he does have a wound over the wrist this is now 5 mm in diameter covered with eschar there is no drainage   on examination of the right below the knee amputation this is healing well laterally and medially however to the center of his incision there is a 3 cm area that has dehisced this is open 1 cm in width filled in with exudative tissue.  This does not probe to bone or tendon.  There is scant serous drainage on exam.  There is no surrounding erythema no maceration no odor no sign of ascending cellulitis. Visit Diagnoses:  1. Acquired absence of right lower extremity below knee ([place])   2. Dehiscence of amputation stump (HCC)     Plan: Continue with daily Dial soap cleansing.  May apply a drop of Silvadene to the area that has dehisced.  Iodosorb dressing applied  to the below the knee amputation ulcer he will continue with dressing changes with Silvadene and shrinker.  Sutures removed today.    Open up Ace wrap applied to his right upper extremity.  Patient discussed elevation and return symptoms with patient. Follow-Up Instructions: Return in about 1 week (around 11/26/2018).   Imaging: No results found.  Orders:  No orders of the defined types were placed in this encounter.  No orders of the defined types were placed in this encounter.    PMFS History: Patient Active Problem List   Diagnosis Date Noted  . Surgery follow-up 10/24/2018  . Acquired absence of right lower extremity below knee ([place]) 10/24/2018  . Dehiscence of amputation stump ([place])   . Encounter for removal of biliary stent   . Bile leak   . Bile leak, postoperative   . Acute gangrenous cholecystitis s/p lap cholecystectomy 02/28/2018 03/02/2018  . AKI (acute kidney injury) ([place])   . Acute hypoxemic respiratory failure ([place]) 02/06/2018  . PAF (paroxysmal atrial fibrillation) ([place]) 02/05/2018  . Aortic atherosclerosis ([place]) 02/05/2018  . Acute renal failure (ARF) ([place]) 01/27/2018  . Hyponatremia 01/27/2018  . 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 S/P bilateral BKA (below knee amputation) ([place])   . Post-operative pain   . Benign essential HTN   .  Diabetes mellitus type 2 in obese ([place])   . PVD (peripheral vascular disease) ([place])   . History of prosthetic mitral valve   . Prosthetic valve endocarditis ([place])   . Staphylococcus aureus bacteremia   . Staphylococcal arthritis of right wrist ([place])   . Splenic infarct 01/14/2018  . Renal insufficiency 01/14/2018  . Severe sepsis ([place]) 01/14/2018  . Dupuytren's disease of palm of both hands 03/18/2017  . S/P BKA (below knee amputation) unilateral, right ([place]) 12/08/2014  . Bicuspid aortic valve   . Hyperlipidemia   . Hypertension   . Greater trochanter fracture ([place]) 07/28/2014  . Hip fracture, right ([place])  07/28/2014  . HLD (hyperlipidemia) 07/28/2014  . GERD (gastroesophageal reflux disease) 07/28/2014  . Hip fracture ([place]) 07/28/2014  . Fall 07/28/2014  . Leukocytosis 07/28/2014  . Thrombocytopenia ([place]) 07/28/2014  . H/O aortic valve replacement 10/16/2011  . 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 PLEURAL EFFUSION 05/25/2009  . OPEN WOUND OF CHEST , COMPLICATED 70/62/3762  . PROSTHETIC VALVE - BIO/ ENDOPROSTHESIS 05/25/2009  . Insulin-requiring or dependent type II diabetes mellitus ([place]) 04/19/2009  . HYPERLIPIDEMIA 04/19/2009  . Essential hypertension 04/19/2009  . CAD (coronary artery disease) 04/19/2009  . Peripheral vascular disease ([place]) 04/19/2009  . OSTEOMYELITIS 04/19/2009  . BICUSPID AORTIC VALVE 04/19/2009   Past Medical History:  Diagnosis Date  . Abscess of right leg   . 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 Acute and subacute infective endocarditis   . Acute hematogenous osteomyelitis, right ankle and foot ([place])   . Bicuspid aortic valve    a. 02/2009 s/p tissue AVR;  b. 07/2014 Echo: EF 55-60%, basal-mid inf HK, mild AS/MR, mod dil LA, PASP 44 mmHg.  Marland Kitchen CAD (coronary artery disease) October 2010   a. 02/2009 NSTEMI/Cath: 3VD; b. 02/2009 CABG x 5: LIMA->LAD, VG->Diag, VG->RI, VG->RVM->RPDA.  . Cellulitis of left lower extremity 04/26/2016  . Chronic kidney disease    stage 5 - not on dialysis  Kidney failure due antibiotic for bad infection, kidneys are working again per pt  . Gangrene of gallbladder in cholecystitis   . GERD (gastroesophageal reflux disease)   . Heart murmur   . Hyperlipidemia   . Hypertension   . Myocardial infarction ([place]) 2010  . PAF (paroxysmal atrial fibrillation) ([place])    02/2018  . PVD (peripheral vascular disease) ([place])    a. s/p toe amputations on L foot.  . Right foot infection 01/15/2018  . Sepsis due to methicillin susceptible Staphylococcus aureus ([place])   . Type II diabetes mellitus (HCC)     Family  History  Problem Relation Age of Onset  . Heart attack Father        early age  . Heart disease Father   . Stroke Father   . Heart attack Brother        At age 42  . Stroke Brother   . Diabetes type II Mother   . Diabetes type II Sister   . Diabetes Other   . Coronary artery disease Other   . Colon cancer Neg Hx   . Esophageal cancer Neg Hx     Past Surgical History:  Procedure Laterality Date  . AMPUTATION Left 2011   great and second toes  . AMPUTATION  07/27/2011   Procedure: AMPUTATION FOOT;  Surgeon: Newt Minion, MD;  Location: [place];  Service: Orthopedics;  Laterality: Left;  Left Midfoot Amputation   . AMPUTATION  Left 12/08/2014   Procedure: Left Below Knee Amputation;  Surgeon: Newt Minion, MD;  Location: [place];  Service: Orthopedics;  Laterality: Left;  . AMPUTATION Right 01/17/2018   Procedure: RIGHT BELOW KNEE AMPUTATION;  Surgeon: Newt Minion, MD;  Location: [place];  Service: Orthopedics;  Laterality: Right;  . AMPUTATION TOE Right    "all my toes on my right foot have been amputated"  . AORTIC VALVE REPLACEMENT  2010   Pericardial tissue valve  . BILIARY STENT PLACEMENT  03/06/2018   Procedure: BILIARY STENT PLACEMENT;  Surgeon: Irene Shipper, MD;  Location: [facility] ENDOSCOPY;  Service: Endoscopy;;  . CARDIAC CATHETERIZATION  2010  . CARDIAC VALVE REPLACEMENT    . CATARACT EXTRACTION W/ INTRAOCULAR LENS  IMPLANT, BILATERAL Bilateral   . CHOLECYSTECTOMY N/A 02/28/2018   Procedure: LAPAROSCOPIC CHOLECYSTECTOMY;  Surgeon: Coralie Keens, MD;  Location: [place];  Service: General;  Laterality: N/A;  . CORONARY ARTERY BYPASS GRAFT  2010   "CABG X5"  . ERCP N/A 03/06/2018   Procedure: ENDOSCOPIC RETROGRADE CHOLANGIOPANCREATOGRAPHY (ERCP);  Surgeon: Irene Shipper, MD;  Location: [facility] ENDOSCOPY;  Service: Endoscopy;  Laterality: N/A;  . ERCP N/A 05/26/2018   Procedure: ENDOSCOPIC RETROGRADE CHOLANGIOPANCREATOGRAPHY (ERCP);  Surgeon: Irene Shipper, MD;  Location: [facility];  Service: Endoscopy;  Laterality: N/A;  . IR FLUORO GUIDE CV LINE RIGHT  02/13/2018  . IR REMOVAL TUN CV CATH W/O FL  02/24/2018  . IR US GUIDE VASC ACCESS RIGHT  02/13/2018  . REMOVAL OF STONES  03/06/2018   Procedure: REMOVAL OF STONES;  Surgeon: Irene Shipper, MD;  Location: [facility] ENDOSCOPY;  Service: Endoscopy;;  . Joan Mayans  03/06/2018   Procedure: Joan Mayans;  Surgeon: Irene Shipper, MD;  Location: [facility] ENDOSCOPY;  Service: Endoscopy;;  . Lavell Islam REMOVAL  05/26/2018   Procedure: STENT REMOVAL;  Surgeon: Irene Shipper, MD;  Location: [facility] ENDOSCOPY;  Service: Endoscopy;;  . STUMP REVISION Right 10/24/2018  . STUMP REVISION Right 10/24/2018   Procedure: REVISION RIGHT BELOW KNEE AMPUTATION;  Surgeon: Newt Minion, MD;  Location: [place];  Service: Orthopedics;  Laterality: Right;  . TEE WITHOUT CARDIOVERSION N/A 01/21/2018   Procedure: TRANSESOPHAGEAL ECHOCARDIOGRAM (TEE);  Surgeon: Buford Dresser, MD;  Location: [facility] ENDOSCOPY;  Service: Cardiovascular;  Laterality: N/A;   Social History   Occupational History  . Occupation: Publishing rights manager: [employer]  Tobacco Use  . Smoking status: Former Smoker    Packs/day: 1.00    Years: 24.00    Pack years: 24.00    Types: Cigarettes    Quit date: 10/15/2000    Years since quitting: 18.1  . Smokeless tobacco: Never Used  Substance and Sexual Activity  . Alcohol use: Not Currently    Comment: 01/15/2018 "1-2 drinks/month"  . Drug use: No  . Sexual activity: Not Currently

## 2018-11-27 ENCOUNTER — Ambulatory Visit (INDEPENDENT_AMBULATORY_CARE_PROVIDER_SITE_OTHER): Payer: Medicare Other | Admitting: Orthopedic Surgery

## 2018-11-27 ENCOUNTER — Encounter: Payer: Self-pay | Admitting: Orthopedic Surgery

## 2018-11-27 VITALS — Ht 70.0 in | Wt 170.0 lb

## 2018-11-27 DIAGNOSIS — T8781 Dehiscence of amputation stump: Secondary | ICD-10-CM

## 2018-11-27 DIAGNOSIS — E43 Unspecified severe protein-calorie malnutrition: Secondary | ICD-10-CM

## 2018-11-27 DIAGNOSIS — Z89511 Acquired absence of right leg below knee: Secondary | ICD-10-CM

## 2018-12-01 ENCOUNTER — Encounter: Payer: Self-pay | Admitting: Orthopedic Surgery

## 2018-12-01 ENCOUNTER — Other Ambulatory Visit: Payer: Self-pay

## 2018-12-01 NOTE — Progress Notes (Signed)
Office Visit Note   Patient: [name]           Date of Birth: 1955-07-23           MRN: [id number] Visit Date: 11/27/2018              Requested by: No referring provider defined for this encounter. PCP: Patient, No Pcp Per  Chief Complaint  Patient presents with  . Right Leg - Routine Post Op    10/24/18 revision right BKA       HPI: Patient is a 63 year old gentleman who presents about 6 weeks status post revision right transtibial amputation.  Patient states he has a small open area with good granulation tissue.  Patient denies any redness odor or drainage.  Assessment & Plan: Visit Diagnoses:  1. Acquired absence of right lower extremity below knee ([place])   2. Dehiscence of amputation stump ([place])     Plan: Patient does have wound dehiscence with exposed tibia.  Will need to plan for revision of the transtibial amputation.  Patient states he would like to be discharged to home with family member helping.  Follow-Up Instructions: Return in about 2 weeks (around 12/11/2018).   Ortho Exam  Patient is alert, oriented, no adenopathy, well-dressed, normal affect, normal respiratory effort. Examination patient has a wound that is approximately 3 cm in diameter 1 cm deep with exposed tibia.  There is no cellulitis no odor no drainage no cellulitis.  Patient has severe protein caloric malnutrition with uncontrolled type 2 diabetes.  Imaging: No results found. No images are attached to the encounter.  Labs: Lab Results  Component Value Date   HGBA1C 8.5 (H) 01/16/2018   HGBA1C 9.9 (H) 07/28/2014   HGBA1C  03/27/2009    6.1 (NOTE) The ADA recommends the following therapeutic goal for glycemic control related to Hgb A1c measurement: Goal of therapy: <6.5 Hgb A1c  Reference: American Diabetes Association: Clinical Practice Recommendations 2010, Diabetes Care, 2010, 33: (Suppl  1).   ESRSEDRATE 46 (H) 03/20/2018   ESRSEDRATE 138 (H) 02/13/2018   ESRSEDRATE 40  01/23/2018   CRP 16.2 (H) 03/20/2018   CRP 11.8 (H) 01/17/2018   CRP 15.6 (H) 01/16/2018   LABURIC 3.6 (L) 10/25/2018   REPTSTATUS 10/29/2018 FINAL 10/24/2018   GRAMSTAIN NO WBC SEEN NO ORGANISMS SEEN  10/24/2018   CULT  10/24/2018    FEW CORYNEBACTERIUM STRIATUM FEW STREPTOCOCCUS MITIS/ORALIS NO ANAEROBES ISOLATED Performed at [facility], [place] [address] [place], [place] [address]    LABORGA STREPTOCOCCUS MITIS/ORALIS 10/24/2018     Lab Results  Component Value Date   ALBUMIN 1.8 (L) 03/11/2018   ALBUMIN 1.7 (L) 03/10/2018   ALBUMIN 1.7 (L) 03/09/2018   PREALBUMIN 16.6 (L) 05/09/2009   PREALBUMIN 12.2 (L) 05/02/2009   PREALBUMIN 7.4 (L) 04/25/2009   LABURIC 3.6 (L) 10/25/2018    Lab Results  Component Value Date   MG 1.5 (L) 03/08/2018   MG 1.5 (L) 03/02/2018   MG 2.1 01/31/2018   No results found for: VD25OH  Lab Results  Component Value Date   PREALBUMIN 16.6 (L) 05/09/2009   PREALBUMIN 12.2 (L) 05/02/2009   PREALBUMIN 7.4 (L) 04/25/2009   CBC EXTENDED Latest Ref Rng & Units 10/24/2018 03/10/2018 03/08/2018  WBC 4.0 - 10.5 K/uL 12.6(H) 12.7(H) 13.6(H)  RBC 4.22 - 5.81 MIL/uL 3.20(L) 3.55(L) 3.39(L)  HGB 13.0 - 17.0 g/dL 8.8(L) 9.6(L) 9.1(L)  HCT 39.0 - 52.0 % 28.1(L) 32.9(L) 31.9(L)  PLT 150 -  400 K/uL 196 147(L) 159  NEUTROABS 1.7 - 7.7 K/uL - 10.7(H) 11.7(H)  LYMPHSABS 0.7 - 4.0 K/uL - 1.0 1.1     Body mass index is 24.39 kg/m.  Orders:  No orders of the defined types were placed in this encounter.  No orders of the defined types were placed in this encounter.    Procedures: No procedures performed  Clinical Data: No additional findings.  ROS:  All other systems negative, except as noted in the HPI. Review of Systems  Objective: Vital Signs: Ht 5\' 10"  (1.778 m)   Wt 170 lb (77.1 kg)   BMI 24.39 kg/m   Specialty Comments:  No specialty comments available.  PMFS History: Patient Active Problem List   Diagnosis Date Noted   . Surgery follow-up 10/24/2018  . Acquired absence of right lower extremity below knee ([place]) 10/24/2018  . Dehiscence of amputation stump ([place])   . Encounter for removal of biliary stent   . Bile leak   . Bile leak, postoperative   . Acute gangrenous cholecystitis s/p lap cholecystectomy 02/28/2018 03/02/2018  . AKI (acute kidney injury) ([place])   . Acute hypoxemic respiratory failure ([place]) 02/06/2018  . PAF (paroxysmal atrial fibrillation) ([place]) 02/05/2018  . Aortic atherosclerosis ([place]) 02/05/2018  . Acute renal failure (ARF) ([place]) 01/27/2018  . Hyponatremia 01/27/2018  . 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 S/P bilateral BKA (below knee amputation) ([place])   . Post-operative pain   . Benign essential HTN   . Diabetes mellitus type 2 in obese ([place])   . PVD (peripheral vascular disease) ([place])   . History of prosthetic mitral valve   . Prosthetic valve endocarditis ([place])   . Staphylococcus aureus bacteremia   . Staphylococcal arthritis of right wrist ([place])   . Splenic infarct 01/14/2018  . Renal insufficiency 01/14/2018  . Severe sepsis ([place]) 01/14/2018  . Dupuytren's disease of palm of both hands 03/18/2017  . S/P BKA (below knee amputation) unilateral, right ([place]) 12/08/2014  . Bicuspid aortic valve   . Hyperlipidemia   . Hypertension   . Greater trochanter fracture ([place]) 07/28/2014  . Hip fracture, right ([place]) 07/28/2014  . HLD (hyperlipidemia) 07/28/2014  . GERD (gastroesophageal reflux disease) 07/28/2014  . Hip fracture ([place]) 07/28/2014  . Fall 07/28/2014  . Leukocytosis 07/28/2014  . Thrombocytopenia ([place]) 07/28/2014  . H/O aortic valve replacement 10/16/2011  . 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 PLEURAL EFFUSION 05/25/2009  . OPEN WOUND OF CHEST , COMPLICATED 38/02/1750  . PROSTHETIC VALVE - BIO/ ENDOPROSTHESIS 05/25/2009  . Insulin-requiring or dependent type II diabetes mellitus ([place]) 04/19/2009  . HYPERLIPIDEMIA 04/19/2009  . Essential  hypertension 04/19/2009  . CAD (coronary artery disease) 04/19/2009  . Peripheral vascular disease ([place]) 04/19/2009  . OSTEOMYELITIS 04/19/2009  . BICUSPID AORTIC VALVE 04/19/2009   Past Medical History:  Diagnosis Date  . Abscess of right leg   . 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 Acute and subacute infective endocarditis   . Acute hematogenous osteomyelitis, right ankle and foot ([place])   . Bicuspid aortic valve    a. 02/2009 s/p tissue AVR;  b. 07/2014 Echo: EF 55-60%, basal-mid inf HK, mild AS/MR, mod dil LA, PASP 44 mmHg.  Marland Kitchen CAD (coronary artery disease) October 2010   a. 02/2009 NSTEMI/Cath: 3VD; b. 02/2009 CABG x 5: LIMA->LAD, VG->Diag, VG->RI, VG->RVM->RPDA.  . Cellulitis of left lower extremity 04/26/2016  . Chronic kidney disease    stage  5 - not on dialysis  Kidney failure due antibiotic for bad infection, kidneys are working again per pt  . Gangrene of gallbladder in cholecystitis   . GERD (gastroesophageal reflux disease)   . Heart murmur   . Hyperlipidemia   . Hypertension   . Myocardial infarction ([place]) 2010  . PAF (paroxysmal atrial fibrillation) ([place])    02/2018  . PVD (peripheral vascular disease) ([place])    a. s/p toe amputations on L foot.  . Right foot infection 01/15/2018  . Sepsis due to methicillin susceptible Staphylococcus aureus ([place])   . Type II diabetes mellitus (HCC)     Family History  Problem Relation Age of Onset  . Heart attack Father        early age  . Heart disease Father   . Stroke Father   . Heart attack Brother        At age 61  . Stroke Brother   . Diabetes type II Mother   . Diabetes type II Sister   . Diabetes Other   . Coronary artery disease Other   . Colon cancer Neg Hx   . Esophageal cancer Neg Hx     Past Surgical History:  Procedure Laterality Date  . AMPUTATION Left 2011   great and second toes  . AMPUTATION  07/27/2011   Procedure: AMPUTATION FOOT;  Surgeon: Newt Minion, MD;  Location: [place];  Service: Orthopedics;  Laterality: Left;  Left Midfoot Amputation   . AMPUTATION Left 12/08/2014   Procedure: Left Below Knee Amputation;  Surgeon: Newt Minion, MD;  Location: [place];  Service: Orthopedics;  Laterality: Left;  . AMPUTATION Right 01/17/2018   Procedure: RIGHT BELOW KNEE AMPUTATION;  Surgeon: Newt Minion, MD;  Location: [place];  Service: Orthopedics;  Laterality: Right;  . AMPUTATION TOE Right    "all my toes on my right foot have been amputated"  . AORTIC VALVE REPLACEMENT  2010   Pericardial tissue valve  . BILIARY STENT PLACEMENT  03/06/2018   Procedure: BILIARY STENT PLACEMENT;  Surgeon: Irene Shipper, MD;  Location: [facility] ENDOSCOPY;  Service: Endoscopy;;  . CARDIAC CATHETERIZATION  2010  . CARDIAC VALVE REPLACEMENT    . CATARACT EXTRACTION W/ INTRAOCULAR LENS  IMPLANT, BILATERAL Bilateral   . CHOLECYSTECTOMY N/A 02/28/2018   Procedure: LAPAROSCOPIC CHOLECYSTECTOMY;  Surgeon: Coralie Keens, MD;  Location: [place];  Service: General;  Laterality: N/A;  . CORONARY ARTERY BYPASS GRAFT  2010   "CABG X5"  . ERCP N/A 03/06/2018   Procedure: ENDOSCOPIC RETROGRADE CHOLANGIOPANCREATOGRAPHY (ERCP);  Surgeon: Irene Shipper, MD;  Location: [facility] ENDOSCOPY;  Service: Endoscopy;  Laterality: N/A;  . ERCP N/A 05/26/2018   Procedure: ENDOSCOPIC RETROGRADE CHOLANGIOPANCREATOGRAPHY (ERCP);  Surgeon: Irene Shipper, MD;  Location[facility];  Service: Endoscopy;  Laterality: N/A;  . IR FLUORO GUIDE CV LINE RIGHT  02/13/2018  . IR REMOVAL TUN CV CATH W/O FL  02/24/2018  . IR US GUIDE VASC ACCESS RIGHT  02/13/2018  . REMOVAL OF STONES  03/06/2018   Procedure: REMOVAL OF STONES;  Surgeon: Irene Shipper, MD;  Location: [facility] ENDOSCOPY;  Service: Endoscopy;;  . Joan Mayans  03/06/2018   Procedure: Joan Mayans;  Surgeon: Irene Shipper, MD;  Location: [facility] ENDOSCOPY;  Service: Endoscopy;;  . Lavell Islam REMOVAL  05/26/2018   Procedure: STENT REMOVAL;  Surgeon: Irene Shipper, MD;  [facility]  ENDOSCOPY;  Service: Endoscopy;;  . STUMP REVISION Right 10/24/2018  . STUMP REVISION Right  10/24/2018   Procedure: REVISION RIGHT BELOW KNEE AMPUTATION;  Surgeon: Newt Minion, MD;  Location: [place];  Service: Orthopedics;  Laterality: Right;  . TEE WITHOUT CARDIOVERSION N/A 01/21/2018   Procedure: TRANSESOPHAGEAL ECHOCARDIOGRAM (TEE);  Surgeon: Buford Dresser, MD;  Location: [facility] ENDOSCOPY;  Service: Cardiovascular;  Laterality: N/A;   Social History   Occupational History  . Occupation: Publishing rights manager: [employer]  Tobacco Use  . Smoking status: Former Smoker    Packs/day: 1.00    Years: 24.00    Pack years: 24.00    Types: Cigarettes    Quit date: 10/15/2000    Years since quitting: 18.1  . Smokeless tobacco: Never Used  Substance and Sexual Activity  . Alcohol use: Not Currently    Comment: 01/15/2018 "1-2 drinks/month"  . Drug use: No  . Sexual activity: Not Currently

## 2018-12-02 ENCOUNTER — Encounter (HOSPITAL_COMMUNITY): Payer: Self-pay | Admitting: *Deleted

## 2018-12-02 ENCOUNTER — Other Ambulatory Visit: Payer: Self-pay

## 2018-12-02 ENCOUNTER — Other Ambulatory Visit: Payer: Self-pay | Admitting: Family

## 2018-12-02 NOTE — Progress Notes (Addendum)
[name] denies chest pain or shortness of breath.  Patient denies that he nor any of his family have any s/s of covid 76, patient will arrive 3 hours pre- op for covid test. [name] has a history of CAD and has had CABG, he no longer sees a cardiologist. I instructed patient that he is to not eat after midnight. I instructed patient that he may have clear liquids until 5:30 , we went over what clear liquids are; patient drinks unsweetened tea and unsweetened beverages that you mix with water. Patient does not have any beverages with sugar I the house. I instructed patient that he could mix 1/2 tablespoon of sugar in a glass of teas and drink it if CBG < 70 in am.  (1/2  tablespoon of sugar  = 6.7 grams of carbohydrates.) I instructed patient to take 1/2 of Lantus tonight + 6 units..  I instructed patient to check CBG after awaking and every 2 hours until arrival  to the hospital.  I Instructed patient if CBG is less than 70 to drink tea prepared with 1/2 tablespoon sugar, ( patient does not have any juice or drink with sugar in the house). Recheck CBG in 15 minutes then call pre- op desk at [phone] for further instructions.

## 2018-12-03 ENCOUNTER — Encounter (HOSPITAL_COMMUNITY)
Admission: RE | Disposition: A | Discharge status: Home Health | Payer: Self-pay | Source: Home / Self Care | Attending: Orthopedic Surgery

## 2018-12-03 ENCOUNTER — Other Ambulatory Visit: Payer: Self-pay

## 2018-12-03 ENCOUNTER — Ambulatory Visit (HOSPITAL_COMMUNITY): Payer: Medicare Other | Admitting: Anesthesiology

## 2018-12-03 ENCOUNTER — Encounter (HOSPITAL_COMMUNITY): Payer: Self-pay

## 2018-12-03 ENCOUNTER — Inpatient Hospital Stay (HOSPITAL_COMMUNITY)
Admission: RE | Admit: 2018-12-03 | Discharge: 2018-12-09 | DRG: 617 | Disposition: A | Discharge status: Home Health | Payer: Medicare Other | Attending: Orthopedic Surgery | Admitting: Orthopedic Surgery

## 2018-12-03 DIAGNOSIS — Z89421 Acquired absence of other right toe(s): Secondary | ICD-10-CM

## 2018-12-03 DIAGNOSIS — Z87891 Personal history of nicotine dependence: Secondary | ICD-10-CM | POA: Diagnosis not present

## 2018-12-03 DIAGNOSIS — Z79899 Other long term (current) drug therapy: Secondary | ICD-10-CM | POA: Diagnosis not present

## 2018-12-03 DIAGNOSIS — E1122 Type 2 diabetes mellitus with diabetic chronic kidney disease: Secondary | ICD-10-CM | POA: Diagnosis present

## 2018-12-03 DIAGNOSIS — I2510 Atherosclerotic heart disease of native coronary artery without angina pectoris: Secondary | ICD-10-CM | POA: Diagnosis present

## 2018-12-03 DIAGNOSIS — I480 Paroxysmal atrial fibrillation: Secondary | ICD-10-CM | POA: Diagnosis present

## 2018-12-03 DIAGNOSIS — E785 Hyperlipidemia, unspecified: Secondary | ICD-10-CM | POA: Diagnosis present

## 2018-12-03 DIAGNOSIS — Z833 Family history of diabetes mellitus: Secondary | ICD-10-CM

## 2018-12-03 DIAGNOSIS — Z823 Family history of stroke: Secondary | ICD-10-CM | POA: Diagnosis not present

## 2018-12-03 DIAGNOSIS — E1151 Type 2 diabetes mellitus with diabetic peripheral angiopathy without gangrene: Secondary | ICD-10-CM | POA: Diagnosis present

## 2018-12-03 DIAGNOSIS — T8743 Infection of amputation stump, right lower extremity: Secondary | ICD-10-CM | POA: Diagnosis not present

## 2018-12-03 DIAGNOSIS — Z794 Long term (current) use of insulin: Secondary | ICD-10-CM

## 2018-12-03 DIAGNOSIS — K219 Gastro-esophageal reflux disease without esophagitis: Secondary | ICD-10-CM | POA: Diagnosis present

## 2018-12-03 DIAGNOSIS — M86261 Subacute osteomyelitis, right tibia and fibula: Secondary | ICD-10-CM | POA: Diagnosis not present

## 2018-12-03 DIAGNOSIS — I252 Old myocardial infarction: Secondary | ICD-10-CM | POA: Diagnosis not present

## 2018-12-03 DIAGNOSIS — T8781 Dehiscence of amputation stump: Secondary | ICD-10-CM | POA: Diagnosis present

## 2018-12-03 DIAGNOSIS — E1169 Type 2 diabetes mellitus with other specified complication: Secondary | ICD-10-CM | POA: Diagnosis not present

## 2018-12-03 DIAGNOSIS — Z20828 Contact with and (suspected) exposure to other viral communicable diseases: Secondary | ICD-10-CM | POA: Diagnosis present

## 2018-12-03 DIAGNOSIS — N189 Chronic kidney disease, unspecified: Secondary | ICD-10-CM | POA: Diagnosis not present

## 2018-12-03 DIAGNOSIS — Z8249 Family history of ischemic heart disease and other diseases of the circulatory system: Secondary | ICD-10-CM

## 2018-12-03 DIAGNOSIS — I129 Hypertensive chronic kidney disease with stage 1 through stage 4 chronic kidney disease, or unspecified chronic kidney disease: Secondary | ICD-10-CM | POA: Diagnosis not present

## 2018-12-03 DIAGNOSIS — Z888 Allergy status to other drugs, medicaments and biological substances status: Secondary | ICD-10-CM | POA: Diagnosis not present

## 2018-12-03 DIAGNOSIS — B9689 Other specified bacterial agents as the cause of diseases classified elsewhere: Secondary | ICD-10-CM | POA: Diagnosis not present

## 2018-12-03 DIAGNOSIS — S88111A Complete traumatic amputation at level between knee and ankle, right lower leg, initial encounter: Secondary | ICD-10-CM | POA: Diagnosis present

## 2018-12-03 DIAGNOSIS — Z951 Presence of aortocoronary bypass graft: Secondary | ICD-10-CM

## 2018-12-03 HISTORY — PX: STUMP REVISION: SHX6102

## 2018-12-03 LAB — CBC
HCT: 30.5 % — ABNORMAL LOW (ref 39.0–52.0)
Hemoglobin: 9.3 g/dL — ABNORMAL LOW (ref 13.0–17.0)
MCH: 26.8 pg (ref 26.0–34.0)
MCHC: 30.5 g/dL (ref 30.0–36.0)
MCV: 87.9 fL (ref 80.0–100.0)
Platelets: 214 10*3/uL (ref 150–400)
RBC: 3.47 MIL/uL — ABNORMAL LOW (ref 4.22–5.81)
RDW: 14.1 % (ref 11.5–15.5)
WBC: 9.3 10*3/uL (ref 4.0–10.5)
nRBC: 0 % (ref 0.0–0.2)

## 2018-12-03 LAB — BASIC METABOLIC PANEL
Anion gap: 9 (ref 5–15)
BUN: 16 mg/dL (ref 8–23)
CO2: 24 mmol/L (ref 22–32)
Calcium: 9 mg/dL (ref 8.9–10.3)
Chloride: 102 mmol/L (ref 98–111)
Creatinine, Ser: 0.93 mg/dL (ref 0.61–1.24)
GFR calc Af Amer: >60 mL/min (ref >60)
GFR calc non Af Amer: >60 mL/min (ref >60)
Glucose, Bld: 202 mg/dL — ABNORMAL HIGH (ref 70–99)
Potassium: 4.3 mmol/L (ref 3.5–5.1)
Sodium: 135 mmol/L (ref 135–145)

## 2018-12-03 LAB — URIC ACID: Uric Acid, Serum: 3.4 mg/dL — ABNORMAL LOW (ref 3.7–8.6)

## 2018-12-03 LAB — GLUCOSE, CAPILLARY
Glucose-Capillary: 187 mg/dL — ABNORMAL HIGH (ref 70–99)
Glucose-Capillary: 200 mg/dL — ABNORMAL HIGH (ref 70–99)
Glucose-Capillary: 255 mg/dL — ABNORMAL HIGH (ref 70–99)

## 2018-12-03 LAB — SURGICAL PCR SCREEN
MRSA, PCR: NEGATIVE
Staphylococcus aureus: NEGATIVE

## 2018-12-03 LAB — SARS CORONAVIRUS 2 BY RT PCR (HOSPITAL ORDER, PERFORMED IN ~~LOC~~ HOSPITAL LAB): SARS Coronavirus 2: NEGATIVE

## 2018-12-03 LAB — HEMOGLOBIN A1C
Hgb A1c MFr Bld: 7.1 % — ABNORMAL HIGH (ref 4.8–5.6)
Mean Plasma Glucose: 157.07 mg/dL

## 2018-12-03 SURGERY — REVISION, AMPUTATION SITE
Anesthesia: General | Laterality: Right

## 2018-12-03 MED ORDER — DEXAMETHASONE SODIUM PHOSPHATE 10 MG/ML IJ SOLN
INTRAMUSCULAR | Status: DC | PRN
  Administered 2018-12-03: 5 mg via INTRAVENOUS

## 2018-12-03 MED ORDER — ENSURE PRE-SURGERY PO LIQD
296.0000 mL | Freq: Once | ORAL | Status: DC
  Filled 2018-12-03: qty 296

## 2018-12-03 MED ORDER — ONDANSETRON HCL 4 MG/2ML IJ SOLN: 4.0000 mg | Freq: Once | INTRAMUSCULAR | Status: DC | PRN

## 2018-12-03 MED ORDER — CLINDAMYCIN PHOSPHATE 600 MG/50ML IV SOLN
600.0000 mg | Freq: Three times a day (TID) | INTRAVENOUS | Status: DC
  Administered 2018-12-03 – 2018-12-06 (×10): 600 mg via INTRAVENOUS
  Filled 2018-12-03 (×10): qty 50

## 2018-12-03 MED ORDER — 0.9 % SODIUM CHLORIDE (POUR BTL) OPTIME
TOPICAL | Status: DC | PRN
  Administered 2018-12-03: 08:00:00 1000 mL

## 2018-12-03 MED ORDER — PROPOFOL 10 MG/ML IV BOLUS
INTRAVENOUS | Status: DC | PRN
  Administered 2018-12-03: 120 mg via INTRAVENOUS

## 2018-12-03 MED ORDER — FENTANYL CITRATE (PF) 100 MCG/2ML IJ SOLN
INTRAMUSCULAR | Status: AC
  Administered 2018-12-03: 25 ug via INTRAVENOUS
  Filled 2018-12-03: qty 2

## 2018-12-03 MED ORDER — SIMVASTATIN 20 MG PO TABS
20.0000 mg | ORAL_TABLET | Freq: Every day | ORAL | Status: DC
  Administered 2018-12-03 – 2018-12-08 (×6): 20 mg via ORAL
  Filled 2018-12-03 (×6): qty 1

## 2018-12-03 MED ORDER — CHOLESTYRAMINE 4 G PO PACK
1.0000 | PACK | Freq: Every day | ORAL | Status: DC
  Administered 2018-12-04 – 2018-12-09 (×6): 1 via ORAL
  Filled 2018-12-03 (×6): qty 1

## 2018-12-03 MED ORDER — CHLORHEXIDINE GLUCONATE 4 % EX LIQD: 60.0000 mL | Freq: Once | CUTANEOUS | Status: DC

## 2018-12-03 MED ORDER — INSULIN ASPART 100 UNIT/ML ~~LOC~~ SOLN
4.0000 [IU] | Freq: Three times a day (TID) | SUBCUTANEOUS | Status: DC
  Administered 2018-12-03 – 2018-12-09 (×15): 4 [IU] via SUBCUTANEOUS

## 2018-12-03 MED ORDER — MAGNESIUM CITRATE PO SOLN: 1.0000 | Freq: Once | ORAL | Status: DC | PRN

## 2018-12-03 MED ORDER — METOPROLOL TARTRATE 50 MG PO TABS
50.0000 mg | ORAL_TABLET | Freq: Two times a day (BID) | ORAL | Status: DC
  Administered 2018-12-03 – 2018-12-09 (×12): 50 mg via ORAL
  Filled 2018-12-03 (×12): qty 1

## 2018-12-03 MED ORDER — METHOCARBAMOL 1000 MG/10ML IJ SOLN
500.0000 mg | Freq: Four times a day (QID) | INTRAVENOUS | Status: DC | PRN
  Filled 2018-12-03: qty 5

## 2018-12-03 MED ORDER — ONDANSETRON HCL 4 MG/2ML IJ SOLN
INTRAMUSCULAR | Status: DC | PRN
  Administered 2018-12-03: 4 mg via INTRAVENOUS

## 2018-12-03 MED ORDER — OXYCODONE HCL 5 MG PO TABS: 5.0000 mg | ORAL_TABLET | Freq: Once | ORAL | Status: DC | PRN

## 2018-12-03 MED ORDER — POVIDONE-IODINE 10 % EX SWAB: 2.0000 "application " | Freq: Once | CUTANEOUS | Status: DC

## 2018-12-03 MED ORDER — CLINDAMYCIN PHOSPHATE 900 MG/50ML IV SOLN
INTRAVENOUS | Status: AC
  Filled 2018-12-03: qty 50

## 2018-12-03 MED ORDER — ACETAMINOPHEN 325 MG PO TABS
325.0000 mg | ORAL_TABLET | Freq: Four times a day (QID) | ORAL | Status: DC | PRN
  Administered 2018-12-04 – 2018-12-07 (×3): 650 mg via ORAL
  Filled 2018-12-03 (×3): qty 2

## 2018-12-03 MED ORDER — METOCLOPRAMIDE HCL 5 MG PO TABS: 5.0000 mg | ORAL_TABLET | Freq: Three times a day (TID) | ORAL | Status: DC | PRN

## 2018-12-03 MED ORDER — FENTANYL CITRATE (PF) 250 MCG/5ML IJ SOLN
INTRAMUSCULAR | Status: AC
  Filled 2018-12-03: qty 5

## 2018-12-03 MED ORDER — OXYCODONE HCL 5 MG PO TABS: 10.0000 mg | ORAL_TABLET | ORAL | Status: DC | PRN

## 2018-12-03 MED ORDER — HYDROMORPHONE HCL 1 MG/ML IJ SOLN: 0.5000 mg | INTRAMUSCULAR | Status: DC | PRN

## 2018-12-03 MED ORDER — LACTATED RINGERS IV SOLN
INTRAVENOUS | Status: DC
  Administered 2018-12-03: 08:00:00 via INTRAVENOUS

## 2018-12-03 MED ORDER — ONDANSETRON HCL 4 MG/2ML IJ SOLN: 4.0000 mg | Freq: Four times a day (QID) | INTRAMUSCULAR | Status: DC | PRN

## 2018-12-03 MED ORDER — ASPIRIN EC 325 MG PO TBEC
325.0000 mg | DELAYED_RELEASE_TABLET | Freq: Every day | ORAL | Status: DC
  Administered 2018-12-04 – 2018-12-09 (×6): 325 mg via ORAL
  Filled 2018-12-03 (×6): qty 1

## 2018-12-03 MED ORDER — METHOCARBAMOL 500 MG PO TABS: 500.0000 mg | ORAL_TABLET | Freq: Four times a day (QID) | ORAL | Status: DC | PRN

## 2018-12-03 MED ORDER — EPHEDRINE SULFATE 50 MG/ML IJ SOLN
INTRAMUSCULAR | Status: DC | PRN
  Administered 2018-12-03 (×2): 10 mg via INTRAVENOUS
  Administered 2018-12-03: 5 mg via INTRAVENOUS

## 2018-12-03 MED ORDER — AMLODIPINE BESYLATE 2.5 MG PO TABS
2.5000 mg | ORAL_TABLET | Freq: Every day | ORAL | Status: DC
  Administered 2018-12-04 – 2018-12-09 (×6): 2.5 mg via ORAL
  Filled 2018-12-03 (×6): qty 1

## 2018-12-03 MED ORDER — CLINDAMYCIN PHOSPHATE 900 MG/50ML IV SOLN
900.0000 mg | INTRAVENOUS | Status: AC
  Administered 2018-12-03: 900 mg via INTRAVENOUS

## 2018-12-03 MED ORDER — ONDANSETRON HCL 4 MG PO TABS: 4.0000 mg | ORAL_TABLET | Freq: Four times a day (QID) | ORAL | Status: DC | PRN

## 2018-12-03 MED ORDER — INSULIN GLARGINE 100 UNIT/ML ~~LOC~~ SOLN
10.0000 [IU] | Freq: Every day | SUBCUTANEOUS | Status: DC
  Administered 2018-12-03 – 2018-12-07 (×5): 10 [IU] via SUBCUTANEOUS
  Filled 2018-12-03 (×7): qty 0.1

## 2018-12-03 MED ORDER — FENTANYL CITRATE (PF) 100 MCG/2ML IJ SOLN
25.0000 ug | INTRAMUSCULAR | Status: DC | PRN
  Administered 2018-12-03: 25 ug via INTRAVENOUS

## 2018-12-03 MED ORDER — METOCLOPRAMIDE HCL 5 MG/ML IJ SOLN: 5.0000 mg | Freq: Three times a day (TID) | INTRAMUSCULAR | Status: DC | PRN

## 2018-12-03 MED ORDER — OXYCODONE HCL 5 MG/5ML PO SOLN: 5.0000 mg | Freq: Once | ORAL | Status: DC | PRN

## 2018-12-03 MED ORDER — MIDAZOLAM HCL 2 MG/2ML IJ SOLN
INTRAMUSCULAR | Status: AC
  Filled 2018-12-03: qty 2

## 2018-12-03 MED ORDER — BISACODYL 10 MG RE SUPP: 10.0000 mg | Freq: Every day | RECTAL | Status: DC | PRN

## 2018-12-03 MED ORDER — POLYETHYLENE GLYCOL 3350 17 G PO PACK: 17.0000 g | PACK | Freq: Every day | ORAL | Status: DC | PRN

## 2018-12-03 MED ORDER — LATANOPROST 0.005 % OP SOLN
1.0000 [drp] | Freq: Every day | OPHTHALMIC | Status: DC
  Administered 2018-12-03 – 2018-12-08 (×5): 1 [drp] via OPHTHALMIC
  Filled 2018-12-03: qty 2.5

## 2018-12-03 MED ORDER — OXYCODONE HCL 5 MG PO TABS: 5.0000 mg | ORAL_TABLET | ORAL | Status: DC | PRN

## 2018-12-03 MED ORDER — SODIUM CHLORIDE 0.9 % IV SOLN
INTRAVENOUS | Status: DC
  Administered 2018-12-03: 16:00:00 via INTRAVENOUS

## 2018-12-03 MED ORDER — INSULIN ASPART 100 UNIT/ML ~~LOC~~ SOLN
0.0000 [IU] | Freq: Three times a day (TID) | SUBCUTANEOUS | Status: DC
  Administered 2018-12-03: 8 [IU] via SUBCUTANEOUS
  Administered 2018-12-04: 5 [IU] via SUBCUTANEOUS
  Administered 2018-12-04: 3 [IU] via SUBCUTANEOUS
  Administered 2018-12-05 – 2018-12-07 (×6): 2 [IU] via SUBCUTANEOUS
  Administered 2018-12-08 – 2018-12-09 (×3): 3 [IU] via SUBCUTANEOUS

## 2018-12-03 MED ORDER — MIDAZOLAM HCL 5 MG/5ML IJ SOLN
INTRAMUSCULAR | Status: DC | PRN
  Administered 2018-12-03: 2 mg via INTRAVENOUS

## 2018-12-03 MED ORDER — LIDOCAINE 2% (20 MG/ML) 5 ML SYRINGE
INTRAMUSCULAR | Status: DC | PRN
  Administered 2018-12-03: 60 mg via INTRAVENOUS

## 2018-12-03 MED ORDER — DOCUSATE SODIUM 100 MG PO CAPS
100.0000 mg | ORAL_CAPSULE | Freq: Two times a day (BID) | ORAL | Status: DC
  Filled 2018-12-03 (×12): qty 1

## 2018-12-03 SURGICAL SUPPLY — 31 items
BLADE SAW RECIP 87.9 MT (BLADE) IMPLANT
BLADE SURG 21 STRL SS (BLADE) ×3 IMPLANT
CANISTER WOUND CARE 500ML ATS (WOUND CARE) ×3 IMPLANT
COVER SURGICAL LIGHT HANDLE (MISCELLANEOUS) ×3 IMPLANT
COVER WAND RF STERILE (DRAPES) ×3 IMPLANT
DRAPE EXTREMITY T 121X128X90 (DISPOSABLE) ×3 IMPLANT
DRAPE HALF SHEET 40X57 (DRAPES) ×3 IMPLANT
DRAPE INCISE IOBAN 66X45 STRL (DRAPES) ×3 IMPLANT
DRAPE U-SHAPE 47X51 STRL (DRAPES) ×6 IMPLANT
DRESSING PREVENA PLUS CUSTOM (GAUZE/BANDAGES/DRESSINGS) ×1 IMPLANT
DRSG PREVENA PLUS CUSTOM (GAUZE/BANDAGES/DRESSINGS) ×3
DURAPREP 26ML APPLICATOR (WOUND CARE) ×3 IMPLANT
ELECT REM PT RETURN 9FT ADLT (ELECTROSURGICAL) ×3
ELECTRODE REM PT RTRN 9FT ADLT (ELECTROSURGICAL) ×1 IMPLANT
GLOVE BIOGEL PI IND STRL 9 (GLOVE) ×1 IMPLANT
GLOVE BIOGEL PI INDICATOR 9 (GLOVE) ×2
GLOVE SURG ORTHO 9.0 STRL STRW (GLOVE) ×3 IMPLANT
GOWN STRL REUS W/ TWL XL LVL3 (GOWN DISPOSABLE) ×2 IMPLANT
GOWN STRL REUS W/TWL XL LVL3 (GOWN DISPOSABLE) ×6
KIT BASIN OR (CUSTOM PROCEDURE TRAY) ×3 IMPLANT
KIT TURNOVER KIT B (KITS) ×3 IMPLANT
MANIFOLD NEPTUNE II (INSTRUMENTS) ×3 IMPLANT
NS IRRIG 1000ML POUR BTL (IV SOLUTION) ×3 IMPLANT
PACK GENERAL/GYN (CUSTOM PROCEDURE TRAY) ×3 IMPLANT
PAD ARMBOARD 7.5X6 YLW CONV (MISCELLANEOUS) ×3 IMPLANT
PREVENA RESTOR ARTHOFORM 46X30 (CANNISTER) ×3 IMPLANT
STAPLER VISISTAT 35W (STAPLE) IMPLANT
SUT ETHILON 2 0 PSLX (SUTURE) ×6 IMPLANT
SUT SILK 2 0 (SUTURE)
SUT SILK 2-0 18XBRD TIE 12 (SUTURE) IMPLANT
TOWEL GREEN STERILE (TOWEL DISPOSABLE) ×3 IMPLANT

## 2018-12-03 NOTE — Op Note (Signed)
12/03/2018  9:11 AM  PATIENT:  [name]    PRE-OPERATIVE DIAGNOSIS:  DEHISCENCE RIGHT BELOW KNEE AMPUTATION  POST-OPERATIVE DIAGNOSIS: Intramedullary osteomyelitis right tibia  PROCEDURE:  REVISION RIGHT BELOW KNEE AMPUTATION Application of Praveena wound VAC  SURGEON:  Newt Minion, MD  PHYSICIAN ASSISTANT:None ANESTHESIA:   General  PREOPERATIVE INDICATIONS:  [name] is a  63 y.o. male with a diagnosis of DEHISCENCE RIGHT BELOW KNEE AMPUTATION who failed conservative measures and elected for surgical management.    The risks benefits and alternatives were discussed with the patient preoperatively including but not limited to the risks of infection, bleeding, nerve injury, cardiopulmonary complications, the need for revision surgery, among others, and the patient was willing to proceed.  OPERATIVE IMPLANTS: Praveena customizable and arthro-forearm wound vacs  @ENCIMAGES @  OPERATIVE FINDINGS: Intramedullary abscess right tibia tissue bone and culture sent  OPERATIVE PROCEDURE: Patient was brought the operating room underwent general anesthetic.  After adequate levels anesthesia were obtained patient's right lower extremity was prepped using DuraPrep draped into a sterile field a timeout was called.  A fishmouth incision was made around the necrotic wound.  The distal 2 cm of the tibia and fibula were resected.  Patient had a purulent abscess within the medullary shaft of the tibia.  A curette was used to curette the tibia and the abscess and bone were sent for culture tissues.  The wound was irrigated with normal saline 2-0 silk was used for closure of the vessels.  Electrocautery was used for further hemostasis.  The deep and superficial fascia layers and skin was closed using 2-0 nylon.  The customizable Praveena VAC was placed along the surgical incision and this was covered with the arthro-form dressing.  This was covered with [place] this had a good suction fit  patient was extubated taken the PACU in stable condition.   DISCHARGE PLANNING:  Antibiotic duration: Continue IV clindamycin until cultures are finalized  Weightbearing: Nonweightbearing on the right  Pain medication: Opioid pathway  Dressing care/ Wound VAC: Incisional wound VAC x1 week  Ambulatory devices: Walker  Discharge to: Anticipate discharge to home on antibiotics  Follow-up: In the office 1 week post operative.

## 2018-12-03 NOTE — Anesthesia Preprocedure Evaluation (Signed)
Anesthesia Evaluation  Patient identified by MRN, date of birth, ID band Patient awake    Reviewed: Allergy & Precautions, NPO status , Patient's Chart, lab work & pertinent test results  History of Anesthesia Complications Negative for: history of anesthetic complications  Airway Mallampati: I  TM Distance: >3 FB Neck ROM: Full    Dental  (+) Poor Dentition, Missing   Pulmonary neg pulmonary ROS, former smoker,    Pulmonary exam normal        Cardiovascular hypertension, + CAD, + Past MI, + CABG and + Peripheral Vascular Disease  Normal cardiovascular exam+ dysrhythmias Atrial Fibrillation + Valvular Problems/Murmurs (s/p AVR) AS      Neuro/Psych negative neurological ROS  negative psych ROS   GI/Hepatic Neg liver ROS, GERD  ,  Endo/Other  diabetes  Renal/GU Renal disease  negative genitourinary   Musculoskeletal negative musculoskeletal ROS (+)   Abdominal   Peds  Hematology negative hematology ROS (+)   Anesthesia Other Findings TEE 01/21/18: EF 50-55%; bioprosthetic aortic valve functioning normally without regurgitation or vegetation present; vegetation present on the tricuspid valve consistent with endocarditis  Reproductive/Obstetrics                            Anesthesia Physical Anesthesia Plan  ASA: III  Anesthesia Plan: General   Post-op Pain Management:    Induction: Intravenous  PONV Risk Score and Plan: 2 and Ondansetron, Dexamethasone, Midazolam and Treatment may vary due to age or medical condition  Airway Management Planned: LMA  Additional Equipment: None  Intra-op Plan:   Post-operative Plan: Extubation in OR  Informed Consent: I have reviewed the patients History and Physical, chart, labs and discussed the procedure including the risks, benefits and alternatives for the proposed anesthesia with the patient or authorized representative who has indicated  his/her understanding and acceptance.     Dental advisory given  Plan Discussed with:   Anesthesia Plan Comments:         Anesthesia Quick Evaluation

## 2018-12-03 NOTE — Transfer of Care (Signed)
Immediate Anesthesia Transfer of Care Note  Patient: [name]  Procedure(s) Performed: REVISION RIGHT BELOW KNEE AMPUTATION (Right )  Patient Location: PACU  Anesthesia Type:General  Level of Consciousness: drowsy and patient cooperative  Airway & Oxygen Therapy: Patient Spontanous Breathing and Patient connected to nasal cannula oxygen  Post-op Assessment: Report given to RN, Post -op Vital signs reviewed and stable and Patient moving all extremities  Post vital signs: Reviewed and stable  Last Vitals:  Vitals Value Taken Time  BP 117/63 12/03/18 0907  Temp    Pulse 77 12/03/18 0909  Resp 17 12/03/18 0909  SpO2 100 % 12/03/18 0909  Vitals shown include unvalidated device data.  Last Pain:  Vitals:   12/03/18 0755  TempSrc:   PainSc: 6          Complications: No apparent anesthesia complications

## 2018-12-03 NOTE — Anesthesia Procedure Notes (Addendum)
Procedure Name: LMA Insertion Performed by: Moshe Salisbury, CRNA Pre-anesthesia Checklist: Patient identified, Emergency Drugs available, Suction available and Patient being monitored Patient Re-evaluated:Patient Re-evaluated prior to induction Oxygen Delivery Method: Circle System Utilized Preoxygenation: Pre-oxygenation with 100% oxygen Induction Type: IV induction Ventilation: Mask ventilation without difficulty LMA: LMA inserted LMA Size: 5.0 Number of attempts: 1 Placement Confirmation: positive ETCO2 Tube secured with: Tape Dental Injury: Teeth and Oropharynx as per pre-operative assessment

## 2018-12-03 NOTE — Progress Notes (Signed)
1550 Received pt from PACU, A&O x4. Right BKA with wound vac dressing dry and intact. Pt denies pain at this time.

## 2018-12-03 NOTE — H&P (Signed)
[name] is an 63 y.o. male.   Chief Complaint: Ulcer that probes to bone right transtibial amputation. HPI: Patient is a 63 year old gentleman who is over 6 weeks status post revision right transtibial amputation.  Patient has had progressive wound dehiscence with an ulcer that probes to bone.  Past Medical History:  Diagnosis Date  . Abscess of right leg   . 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 Acute and subacute infective endocarditis   . Acute hematogenous osteomyelitis, right ankle and foot ([place])   . Bicuspid aortic valve    a. 02/2009 s/p tissue AVR;  b. 07/2014 Echo: EF 55-60%, basal-mid inf HK, mild AS/MR, mod dil LA, PASP 44 mmHg.  Marland Kitchen CAD (coronary artery disease) October 2010   a. 02/2009 NSTEMI/Cath: 3VD; b. 02/2009 CABG x 5: LIMA->LAD, VG->Diag, VG->RI, VG->RVM->RPDA.  . Cellulitis of left lower extremity 04/26/2016  . Chronic kidney disease 2019   stage 5 - not on dialysis  Kidney failure due antibiotic for bad infection, kidneys are working again per pt ARF   . Gangrene of gallbladder in cholecystitis   . GERD (gastroesophageal reflux disease)   . Heart murmur   . Hyperlipidemia   . Hypertension   . Myocardial infarction ([place]) 2010  . PAF (paroxysmal atrial fibrillation) ([place])    02/2018  . PVD (peripheral vascular disease) ([place])    a. s/p toe amputations on L foot.  . Right foot infection 01/15/2018  . Sepsis due to methicillin susceptible Staphylococcus aureus ([place])   . Type II diabetes mellitus ([place])     Past Surgical History:  Procedure Laterality Date  . AMPUTATION Left 2011   great and second toes  . AMPUTATION  07/27/2011   Procedure: AMPUTATION FOOT;  Surgeon: Newt Minion, MD;  Location: [place];  Service: Orthopedics;  Laterality: Left;  Left Midfoot Amputation   . AMPUTATION Left 12/08/2014   Procedure: Left Below Knee Amputation;  Surgeon: Newt Minion, MD;  Location: [place];  Service: Orthopedics;  Laterality: Left;  .  AMPUTATION Right 01/17/2018   Procedure: RIGHT BELOW KNEE AMPUTATION;  Surgeon: Newt Minion, MD;  Location: [place];  Service: Orthopedics;  Laterality: Right;  . AMPUTATION TOE Right    "all my toes on my right foot have been amputated"  . AORTIC VALVE REPLACEMENT  2010   Pericardial tissue valve  . BILIARY STENT PLACEMENT  03/06/2018   Procedure: BILIARY STENT PLACEMENT;  Surgeon: Irene Shipper, MD;  Location: [facility] ENDOSCOPY;  Service: Endoscopy;;  . CARDIAC CATHETERIZATION  2010  . CARDIAC VALVE REPLACEMENT    . CATARACT EXTRACTION W/ INTRAOCULAR LENS  IMPLANT, BILATERAL Bilateral   . CHOLECYSTECTOMY N/A 02/28/2018   Procedure: LAPAROSCOPIC CHOLECYSTECTOMY;  Surgeon: Coralie Keens, MD;  Location: [place];  Service: General;  Laterality: N/A;  . CORONARY ARTERY BYPASS GRAFT  2010   "CABG X5"  . ERCP N/A 03/06/2018   Procedure: ENDOSCOPIC RETROGRADE CHOLANGIOPANCREATOGRAPHY (ERCP);  Surgeon: Irene Shipper, MD;  Location: [facility] ENDOSCOPY;  Service: Endoscopy;  Laterality: N/A;  . ERCP N/A 05/26/2018   Procedure: ENDOSCOPIC RETROGRADE CHOLANGIOPANCREATOGRAPHY (ERCP);  Surgeon: Irene Shipper, MD;  Location: [facility];  Service: Endoscopy;  Laterality: N/A;  . IR FLUORO GUIDE CV LINE RIGHT  02/13/2018  . IR REMOVAL TUN CV CATH W/O FL  02/24/2018  . IR US GUIDE VASC ACCESS RIGHT  02/13/2018  . REMOVAL OF STONES  03/06/2018  Procedure: REMOVAL OF STONES;  Surgeon: Irene Shipper, MD;  Location: [facility] ENDOSCOPY;  Service: Endoscopy;;  . Joan Mayans  03/06/2018   Procedure: Joan Mayans;  Surgeon: Irene Shipper, MD;  Location: [facility] ENDOSCOPY;  Service: Endoscopy;;  . Lavell Islam REMOVAL  05/26/2018   Procedure: STENT REMOVAL;  Surgeon: Irene Shipper, MD;  Location: [facility] ENDOSCOPY;  Service: Endoscopy;;  . STUMP REVISION Right 10/24/2018  . STUMP REVISION Right 10/24/2018   Procedure: REVISION RIGHT BELOW KNEE AMPUTATION;  Surgeon: Newt Minion, MD;  Location: [place];  Service: Orthopedics;  Laterality:  Right;  . TEE WITHOUT CARDIOVERSION N/A 01/21/2018   Procedure: TRANSESOPHAGEAL ECHOCARDIOGRAM (TEE);  Surgeon: Buford Dresser, MD;  Location: [facility] ENDOSCOPY;  Service: Cardiovascular;  Laterality: N/A;    Family History  Problem Relation Age of Onset  . Heart attack Father        early age  . Heart disease Father   . Stroke Father   . Heart attack Brother        At age 23  . Stroke Brother   . Diabetes type II Mother   . Diabetes type II Sister   . Diabetes Other   . Coronary artery disease Other   . Colon cancer Neg Hx   . Esophageal cancer Neg Hx    Social History:  reports that he quit smoking about 18 years ago. His smoking use included cigarettes. He has a 24.00 pack-year smoking history. He has never used smokeless tobacco. He reports previous alcohol use. He reports that he does not use drugs.  Allergies:  Allergies  Allergen Reactions  . Ace Inhibitors Cough  . Cefazolin     Possible cause of AKI, AIN    Medications Prior to Admission  Medication Sig Dispense Refill  . amLODipine (NORVASC) 2.5 MG tablet Take 2.5 mg by mouth daily.    . Calcium Citrate-Vitamin D (CALCIUM CITRATE + D3) 250-200 MG-UNIT TABS Take 1 tablet by mouth 2 (two) times daily.    . cholestyramine (QUESTRAN) 4 g packet Take 1 packet by mouth every morning.    . insulin glargine (LANTUS) 100 UNIT/ML injection Inject 0.1 mLs (10 Units total) into the skin at bedtime. (Patient taking differently: Inject 16 Units into the skin at bedtime. ) 30 mL 0  . latanoprost (XALATAN) 0.005 % ophthalmic solution Place 1 drop into both eyes at bedtime.  3  . metoprolol tartrate (LOPRESSOR) 50 MG tablet Take 1 tablet (50 mg total) by mouth 2 (two) times daily. 30 tablet 0  . naproxen (NAPROSYN) 500 MG tablet Take 1 tablet (500 mg total) by mouth 2 (two) times daily as needed for mild pain or moderate pain. With meals 60 tablet 0  . pantoprazole (PROTONIX) 40 MG tablet Take 40 mg by mouth daily.      . silver  sulfADIAZINE (SILVADENE) 1 % cream Apply 1 application topically daily. 50 g 0  . simvastatin (ZOCOR) 20 MG tablet Take 20 mg by mouth daily at 6 PM.   98    No results found for this or any previous visit (from the past 48 hour(s)). No results found.  Review of Systems  All other systems reviewed and are negative.   Blood pressure (!) 126/54, pulse 70, temperature 98.5 F (36.9 C), temperature source Oral, resp. rate 16, SpO2 100 %. Physical Exam  Patient is alert, oriented, no adenopathy, well-dressed, normal affect, normal respiratory effort. Examination patient has a wound that is approximately 3 cm in diameter  1 cm deep with exposed tibia.  There is no cellulitis no odor no drainage no cellulitis.  Patient has severe protein caloric malnutrition with uncontrolled type 2 diabetes. Assessment/Plan 1. Acquired absence of right lower extremity below knee ([place])   2. Dehiscence of amputation stump ([place])     Plan: Patient does have wound dehiscence with exposed tibia.  Will need to plan for revision of the transtibial amputation versus an above-the-knee amputation.  Patient states he does not want to consider an above-knee amputation. Patient states he would like to be discharged to home with family member helping.   Newt Minion, MD 12/03/2018, 6:45 AM

## 2018-12-03 NOTE — Anesthesia Postprocedure Evaluation (Signed)
Anesthesia Post Note  Patient: [name]  Procedure(s) Performed: REVISION RIGHT BELOW KNEE AMPUTATION (Right )     Patient location during evaluation: PACU Anesthesia Type: General Level of consciousness: awake and alert Pain management: pain level controlled Vital Signs Assessment: post-procedure vital signs reviewed and stable Respiratory status: spontaneous breathing, nonlabored ventilation and respiratory function stable Cardiovascular status: blood pressure returned to baseline and stable Postop Assessment: no apparent nausea or vomiting Anesthetic complications: no    Last Vitals:  Vitals:   12/03/18 1200 12/03/18 1215  BP:    Pulse: 78 77  Resp: (!) 27 (!) 23  Temp:    SpO2: 96% 95%    Last Pain:  Vitals:   12/03/18 1130  TempSrc:   PainSc: 0-No pain                 Lidia Collum

## 2018-12-04 ENCOUNTER — Encounter (HOSPITAL_COMMUNITY): Payer: Self-pay | Admitting: Orthopedic Surgery

## 2018-12-04 LAB — GLUCOSE, CAPILLARY
Glucose-Capillary: 162 mg/dL — ABNORMAL HIGH (ref 70–99)
Glucose-Capillary: 80 mg/dL (ref 70–99)
Glucose-Capillary: 101 mg/dL — ABNORMAL HIGH (ref 70–99)

## 2018-12-04 NOTE — Evaluation (Signed)
Physical Therapy Evaluation Patient Details Name: [name] MRN: [id number] DOB: 11-15-1955 Today's Date: 12/04/2018   History of Present Illness  Pt is a 63 y.o. male admitted 12/03/18 for revision of R transtibial amputation. 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    Clinical Impression  Pt presents with an overall decrease in functional mobility secondary to above. PTA, pt mod indep with w/c, also ambulatory with BLE prosthetics and SPC prior to R BKA revisions; supportive family lives nearby and assists as needed. Educ on precautions, positioning, therex, portable wound vac use, and importance of mobility. Today, pt able to transfer to recliner at [place]; indep to don LLE prosthesis. Pt would benefit from continued acute PT services to maximize functional mobility and independence prior to d/c with HHPT services.     Follow Up Recommendations Home health PT;Supervision - Intermittent    Equipment Recommendations  None recommended by PT    Recommendations for Other Services       Precautions / Restrictions Precautions Precautions: Fall Precaution Comments: L BKA prosthetic in room, R BKA wound vac Restrictions Weight Bearing Restrictions: Yes RLE Weight Bearing: Non weight bearing      Mobility  Bed Mobility Overal bed mobility: Independent                Transfers Overall transfer level: Needs assistance Equipment used: None Transfers: Squat Pivot Transfers     Squat pivot transfers: Supervision     General transfer comment: Able to perform squat pivot to recliner towards L-side with LLE prosthetic donned; pt reliant on more scooting than an actual fluid pivot, still performed well with normal recliner (not a drop-arm)  Ambulation/Gait                Stairs            Wheelchair Mobility    Modified Rankin (Stroke Patients Only)       Balance Overall balance assessment: Needs  assistance   Sitting balance-Leahy Scale: Good Sitting balance - Comments: Indep to don LLE prosthetic sitting EOB                                     Pertinent Vitals/Pain Pain Assessment: Faces Faces Pain Scale: Hurts little more Pain Location: R residual limb where wound vac is Pain Descriptors / Indicators: Guarding Pain Intervention(s): Monitored during session    Home Living Family/patient expects to be discharged to:: Private residence Living Arrangements: Alone Available Help at Discharge: Family;Available 24 hours/day Type of Home: House Home Access: Ramped entrance     Home Layout: One level Home Equipment: Walker - standard;Cane - single point;Wheelchair - manual Additional Comments: W/c accessible    Prior Function Level of Independence: Needs assistance   Gait / Transfers Assistance Needed: Ambulates with bilateral prosthesis and SPC; since R BKA revisions, primarily uses w/c with LLE prosthetic, squat pivot transfers. Family drives  ADL's / Homemaking Assistance Needed: Reports indep with ADLs. Has had some difficulty gripping with RLE, family assists when needing to sign checks, etc.        Hand Dominance        Extremity/Trunk Assessment   Upper Extremity Assessment Upper Extremity Assessment: Overall WFL for tasks assessed;RUE deficits/detail RUE Deficits / Details: Limited R DIP/PIP flexion since injury a couple months ago, able to passively flex using L hand, but unable to  grip; Dr. Sharol Given aware, pt able to compensate well with L hand    Lower Extremity Assessment Lower Extremity Assessment: RLE deficits/detail;LLE deficits/detail RLE Deficits / Details: s/p R BKA revision; hip and knee grossly WFL LLE Deficits / Details: chronic L BKA (s/p 2016); hip and knee grossly WFL       Communication   Communication: No difficulties  Cognition Arousal/Alertness: Awake/alert Behavior During Therapy: WFL for tasks  assessed/performed Overall Cognitive Status: Within Functional Limits for tasks assessed                                        General Comments General comments (skin integrity, edema, etc.): Reviewed RLE BKA therex and positioning    Exercises     Assessment/Plan    PT Assessment Patient needs continued PT services  PT Problem List Decreased activity tolerance;Decreased balance;Decreased mobility;Decreased knowledge of precautions;Pain       PT Treatment Interventions DME instruction;Functional mobility training;Therapeutic activities;Therapeutic exercise;Balance training;Patient/family education;Wheelchair mobility training    PT Goals (Current goals can be found in the Care Plan section)  Acute Rehab PT Goals Patient Stated Goal: Return home, wants to continue HHPT PT Goal Formulation: With patient Time For Goal Achievement: 12/19/18 Potential to Achieve Goals: Good    Frequency Min 3X/week   Barriers to discharge        Co-evaluation               AM-PAC PT "6 Clicks" Mobility  Outcome Measure Help needed turning from your back to your side while in a flat bed without using bedrails?: None Help needed moving from lying on your back to sitting on the side of a flat bed without using bedrails?: None Help needed moving to and from a bed to a chair (including a wheelchair)?: None Help needed standing up from a chair using your arms (e.g., wheelchair or bedside chair)?: A Little Help needed to walk in hospital room?: A Lot Help needed climbing 3-5 steps with a railing? : Total 6 Click Score: 18    End of Session Equipment Utilized During Treatment: (LLE prosthetic) Activity Tolerance: Patient tolerated treatment well Patient left: in chair;with call bell/phone within reach Nurse Communication: Mobility status PT Visit Diagnosis: Other abnormalities of gait and mobility (R26.89)    Time: 1950-9326 PT Time Calculation (min) (ACUTE ONLY): 33  min   Charges:   PT Evaluation $PT Eval Moderate Complexity: 1 Mod PT Treatments $Therapeutic Activity: 8-22 mins   Mabeline Caras, PT, DPT Acute Rehabilitation Services  Pager [phone] Office [place] 12/04/2018, 10:33 AM

## 2018-12-04 NOTE — TOC Initial Note (Addendum)
Transition of Care [facility]) - Initial/Assessment Note    Patient Details  Name: [name] MRN: [id number] Date of Birth: 1955/07/04  [facility] CM/SW Contact:    Alberteen Sam, LCSW Phone Number: 12/04/2018, 1:55 PM  Clinical Narrative:                  CSW consulted with patient regarding discharge planning, he reports he has used [facility] home health in the past but would not like to use them again. Patient agreeable to use [facility] home health, CSW has set patient up with PT [facility] through [place]. No further needs identified.    Expected Discharge Plan: [place] Barriers to Discharge: Continued Medical Work up   Patient Goals and CMS Choice Patient states their goals for this hospitalization and ongoing recovery are:: to go home CMS Medicare.gov Compare Post Acute Care list provided to:: Patient Choice offered to / list presented to : Patient  Expected Discharge Plan and Services Expected Discharge Plan: [place] Choice: [place] arrangements for the past 2 months: Single Family Home                           HH Arranged: PT [place]: [place] Date [place]: 12/04/18 Time HH Agency Contacted: 90 Representative spoke with at [place]: Georgina Snell  Prior Living Arrangements/Services Living arrangements for the past 2 months: [place] with:: Self Patient language and need for interpreter reviewed:: Yes Do you feel safe going back to the place where you live?: Yes      Need for Family Participation in Patient Care: No (Comment) Care giver support system in place?: Yes (comment)   Criminal Activity/Legal Involvement Pertinent to Current Situation/Hospitalization: No - Comment as needed  Activities of Daily Living Home Assistive Devices/Equipment: Wheelchair, Environmental consultant (specify type)(LEFT LEG PROSTESIS) ADL Screening (condition at time of  admission) Patient's cognitive ability adequate to safely complete daily activities?: Yes Is the patient deaf or have difficulty hearing?: No Does the patient have difficulty seeing, even when wearing glasses/contacts?: No Does the patient have difficulty concentrating, remembering, or making decisions?: No Patient able to express need for assistance with ADLs?: Yes Does the patient have difficulty dressing or bathing?: No Independently performs ADLs?: Yes (appropriate for developmental age) Does the patient have difficulty walking or climbing stairs?: Yes Weakness of Legs: Both(BILATERAL AMPUTEE) Weakness of Arms/Hands: None  Permission Sought/Granted   Permission granted to share information with : No              Emotional Assessment Appearance:: Other (Comment Required(unable to assess - via phone) Attitude/Demeanor/Rapport: Gracious Affect (typically observed): Calm Orientation: : Oriented to Self, Oriented to Place, Oriented to  Time, Oriented to Situation Alcohol / Substance Use: Not Applicable Psych Involvement: No (comment)  Admission diagnosis:  DEHISCENCE RIGHT BELOW KNEE AMPUTATION Patient Active Problem List   Diagnosis Date Noted  . Below-knee amputation of right lower extremity ([place]) 12/03/2018  . Subacute osteomyelitis of right tibia ([place])   . Surgery follow-up 10/24/2018  . Acquired absence of right lower extremity below knee ([place]) 10/24/2018  . Dehiscence of amputation stump ([place])   . Encounter for removal of biliary stent   . Bile leak   . Bile leak, postoperative   . Acute gangrenous cholecystitis s/p lap cholecystectomy 02/28/2018 03/02/2018  . AKI (acute  kidney injury) ([place])   . Acute hypoxemic respiratory failure ([place]) 02/06/2018  . PAF (paroxysmal atrial fibrillation) ([place]) 02/05/2018  . Aortic atherosclerosis ([place]) 02/05/2018  . Acute renal failure (ARF) ([place]) 01/27/2018  . Hyponatremia 01/27/2018  . 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  S/P bilateral BKA (below knee amputation) ([place])   . Post-operative pain   . Benign essential HTN   . Diabetes mellitus type 2 in obese ([place])   . PVD (peripheral vascular disease) ([place])   . History of prosthetic mitral valve   . Prosthetic valve endocarditis ([place])   . Staphylococcus aureus bacteremia   . Staphylococcal arthritis of right wrist ([place])   . Splenic infarct 01/14/2018  . Renal insufficiency 01/14/2018  . Severe sepsis ([place]) 01/14/2018  . Dupuytren's disease of palm of both hands 03/18/2017  . S/P BKA (below knee amputation) unilateral, right ([place]) 12/08/2014  . Bicuspid aortic valve   . Hyperlipidemia   . Hypertension   . Greater trochanter fracture ([place]) 07/28/2014  . Hip fracture, right ([place]) 07/28/2014  . HLD (hyperlipidemia) 07/28/2014  . GERD (gastroesophageal reflux disease) 07/28/2014  . Hip fracture ([place]) 07/28/2014  . Fall 07/28/2014  . Leukocytosis 07/28/2014  . Thrombocytopenia ([place]) 07/28/2014  . H/O aortic valve replacement 10/16/2011  . 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 PLEURAL EFFUSION 05/25/2009  . OPEN WOUND OF CHEST , COMPLICATED 17/51/0258  . PROSTHETIC VALVE - BIO/ ENDOPROSTHESIS 05/25/2009  . Insulin-requiring or dependent type II diabetes mellitus ([place]) 04/19/2009  . HYPERLIPIDEMIA 04/19/2009  . Essential hypertension 04/19/2009  . CAD (coronary artery disease) 04/19/2009  . Peripheral vascular disease ([place]) 04/19/2009  . OSTEOMYELITIS 04/19/2009  . BICUSPID AORTIC VALVE 04/19/2009   PCP:  Patient, No Pcp Per Pharmacy:   [place], [place] - [place] [address] [place] [address] Phone: [phone] Fax: [phone]     Social Determinants of Health (SDOH) Interventions    Readmission Risk Interventions No flowsheet data found.

## 2018-12-04 NOTE — Progress Notes (Signed)
Patient ID: [name], male   DOB: Oct 03, 1955, 63 y.o.   MRN: [id number] Postoperative day 1 revision right transtibial amputation.  Patient had a abscess within the tibia.  Cultures are showing gram-positive cocci.  Anticipate discharge on antibiotics.  No drainage in the wound VAC canister.

## 2018-12-04 NOTE — Progress Notes (Signed)
RN attempted to call and update pt's brother, Louie Casa, on pt status and plan of care. Unsuccessful at this time. Will continue to monitor.

## 2018-12-04 NOTE — Plan of Care (Signed)

## 2018-12-05 ENCOUNTER — Inpatient Hospital Stay: Payer: Self-pay

## 2018-12-05 LAB — GLUCOSE, CAPILLARY
Glucose-Capillary: 312 mg/dL — ABNORMAL HIGH (ref 70–99)
Glucose-Capillary: 241 mg/dL — ABNORMAL HIGH (ref 70–99)
Glucose-Capillary: 112 mg/dL — ABNORMAL HIGH (ref 70–99)
Glucose-Capillary: 124 mg/dL — ABNORMAL HIGH (ref 70–99)
Glucose-Capillary: 79 mg/dL (ref 70–99)
Glucose-Capillary: 121 mg/dL — ABNORMAL HIGH (ref 70–99)

## 2018-12-05 MED ORDER — SODIUM CHLORIDE 0.9% FLUSH
10.0000 mL | INTRAVENOUS | Status: DC | PRN
  Administered 2018-12-09: 10 mL
  Filled 2018-12-05: qty 40

## 2018-12-05 NOTE — Progress Notes (Signed)
Peripherally Inserted Central Catheter/Midline Placement  The IV Nurse has discussed with the patient and/or persons authorized to consent for the patient, the purpose of this procedure and the potential benefits and risks involved with this procedure.  The benefits include less needle sticks, lab draws from the catheter, and the patient may be discharged home with the catheter. Risks include, but not limited to, infection, bleeding, blood clot (thrombus formation), and puncture of an artery; nerve damage and irregular heartbeat and possibility to perform a PICC exchange if needed/ordered by physician.  Alternatives to this procedure were also discussed.  Bard Power PICC patient education guide, fact sheet on infection prevention and patient information card has been provided to patient /or left at bedside.    PICC/Midline Placement Documentation  PICC Single Lumen 12/05/18 PICC Right Brachial 44 cm 1 cm (Active)  Indication for Insertion or Continuance of Line Home intravenous therapies (PICC only) 12/05/18 1627  Exposed Catheter (cm) 0 cm 12/05/18 1627  Site Assessment Clean;Dry;Intact 12/05/18 1627  Line Status Flushed;Blood return noted 12/05/18 1627  Dressing Type Transparent 12/05/18 1627  Dressing Status Clean;Dry;Intact;Antimicrobial disc in place 12/05/18 1627  Dressing Intervention New dressing 12/05/18 1627  Dressing Change Due 12/12/18 12/05/18 1627       Christella Noa Albarece 12/05/2018, 4:28 PM

## 2018-12-05 NOTE — Progress Notes (Signed)
RN called and updated pt's brother, Louie Casa, on pt status and plan of care. All questions answered to satisfaction. Will continue to monitor.

## 2018-12-05 NOTE — Progress Notes (Signed)
Subjective: 2 Days Post-Op Procedure(s) (LRB): REVISION RIGHT BELOW KNEE AMPUTATION (Right) Patient reports pain as mild.    Objective: Vital signs in last 24 hours: Temp:  [97.7 F (36.5 C)-99.1 F (37.3 C)] 99.1 F (37.3 C) (07/31 0814) Pulse Rate:  [75-86] 86 (07/31 0814) Resp:  [16-18] 16 (07/31 0814) BP: (112-134)/(55-66) 124/66 (07/31 0814) SpO2:  [98 %-99 %] 98 % (07/31 0814)  Intake/Output from previous day: 07/30 0701 - 07/31 0700 In: 1200 [P.O.:1200] Out: 2500 [Urine:2500] Intake/Output this shift: Total I/O In: -  Out: 700 [Urine:700]  Recent Labs    12/03/18 0723  HGB 9.3*   Recent Labs    12/03/18 0723  WBC 9.3  RBC 3.47*  HCT 30.5*  PLT 214   Recent Labs    12/03/18 0723  NA 135  K 4.3  CL 102  CO2 24  BUN 16  CREATININE 0.93  GLUCOSE 202*  CALCIUM 9.0   No results for input(s): LABPT, INR in the last 72 hours.  VAC dressing in place over transtibial amputation working well. Canister with no drainage.  Right hand decreased edema, but still limited function.   Assessment/Plan: 2 Days Post-Op Procedure(s) (LRB): REVISION RIGHT BELOW KNEE AMPUTATION (Right) Continue VAC therapy and plan Prevena at DC.  Operative cultures from tibia with GPC Continue IV clindamycin for now and plan IV antibiotics at DC, will place PICC line and likely consult ID next week if needed.  Continue therapy. Will ask OT to assess for splinting, treatments for right hand.  Will plan for DC early next week pending results of cultures, etc.   Erlinda Hong, PA-C 12/05/2018, 8:37 AM  [place]

## 2018-12-06 LAB — GLUCOSE, CAPILLARY
Glucose-Capillary: 154 mg/dL — ABNORMAL HIGH (ref 70–99)
Glucose-Capillary: 124 mg/dL — ABNORMAL HIGH (ref 70–99)
Glucose-Capillary: 125 mg/dL — ABNORMAL HIGH (ref 70–99)
Glucose-Capillary: 44 mg/dL — CL (ref 70–99)
Glucose-Capillary: 69 mg/dL — ABNORMAL LOW (ref 70–99)
Glucose-Capillary: 83 mg/dL (ref 70–99)
Glucose-Capillary: 137 mg/dL — ABNORMAL HIGH (ref 70–99)

## 2018-12-06 MED ORDER — VANCOMYCIN HCL 10 G IV SOLR
1750.0000 mg | INTRAVENOUS | Status: AC
  Administered 2018-12-06: 1750 mg via INTRAVENOUS
  Filled 2018-12-06: qty 1750

## 2018-12-06 MED ORDER — VANCOMYCIN HCL IN DEXTROSE 1-5 GM/200ML-% IV SOLN
1000.0000 mg | Freq: Two times a day (BID) | INTRAVENOUS | Status: DC
  Administered 2018-12-07 – 2018-12-08 (×4): 1000 mg via INTRAVENOUS
  Filled 2018-12-06 (×5): qty 200

## 2018-12-06 NOTE — Progress Notes (Signed)
Patient ID: [name], male   DOB: Nov 09, 1955, 63 y.o.   MRN: [id number] Postoperative day 2 revision transtibial amputation.  The cultures are showing gram-positive cocci and gram-positive rods.  There is no drainage in the wound VAC canister.  Anticipate patient will need 6 weeks of antibiotics will consult infectious disease Monday anticipate sensitivities available at that time.

## 2018-12-06 NOTE — Plan of Care (Signed)

## 2018-12-06 NOTE — Evaluation (Addendum)
Occupational Therapy Evaluation Patient Details Name: [name] MRN: [id number] DOB: 30-Sep-1955 Today's Date: 12/06/2018    History of Present Illness Pt is a 63 y.o. male admitted 12/03/18 for revision of R transtibial amputation. 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   Clinical Impression   PTA, pt was living alone and was performing ADLs and used w/c for functional mobility; prior ro R BKA revision, pt was performing functional mobility with BLE prosthetics and SPC. Pt motivated to participate in therapy. Pt donning his left prosthetic with supervision while seated at EOB. Pt performing stand pivot to recliner with RW and Min A for balance and and safety.  Pt also presenting with limited functional use and AROM at his right hand (dominant). Pt would highly benefit from OP OT for dynamic splinting and to address functional deficits at right hand. Will continue to follow acutely and recommend dc to home with follow up with OP OT.     Follow Up Recommendations  Outpatient OT;Supervision - Intermittent ; need to confirm family able to provide transportation to/from OP   Equipment Recommendations  None recommended by OT    Recommendations for Other Services PT consult     Precautions / Restrictions Precautions Precautions: Fall Precaution Comments: L BKA prosthetic in room, R BKA wound vac Restrictions Weight Bearing Restrictions: Yes RLE Weight Bearing: Non weight bearing      Mobility Bed Mobility Overal bed mobility: Independent                Transfers Overall transfer level: Needs assistance Equipment used: Rolling walker (2 wheeled) Transfers: Stand Pivot Transfers   Stand pivot transfers: Min assist       General transfer comment: Min A for power up into standing and then maintain balance to pivot to recliner    Balance Overall balance assessment: Needs assistance   Sitting balance-Leahy Scale:  Good Sitting balance - Comments: Indep to don LLE prosthetic sitting EOB                                   ADL either performed or assessed with clinical judgement   ADL Overall ADL's : Needs assistance/impaired Eating/Feeding: Minimal assistance;Sitting;Bed level Eating/Feeding Details (indicate cue type and reason): Requiring assistance to open lids and containers due to poor use of R hand Grooming: Minimal assistance;Sitting;Bed level Grooming Details (indicate cue type and reason): Min A for bilateral tasks and pt using compensatory techniques for one handed management of utensils.  Upper Body Bathing: Set up;Supervision/ safety;Sitting   Lower Body Bathing: Minimal assistance;Sit to/from stand;Min guard;Sitting/lateral leans Lower Body Bathing Details (indicate cue type and reason): Min A for power up into standing. Pt using lateral leaning at home while seated Upper Body Dressing : Set up;Supervision/safety;Sitting   Lower Body Dressing: Min guard;Minimal assistance;Sit to/from stand Lower Body Dressing Details (indicate cue type and reason): Pt donning left prosthetic with supervision while seated at EOB. Min A for power up into standing. Pt typically laterally leans to don underwear/pants             Functional mobility during ADLs: Minimal assistance;Rolling walker(stand pivot only) General ADL Comments: Pt demonstrating decreased strength and balance. 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 Pt reporting fall ~21months ago impacting his functional use of right (dominant hand). Pt with limited active flexion of digits; ~0-5*. Pt able to perform active extension of digits. Noting increased edema as well. Poor digit abduction and poor adduction with noting compensatory flexion at MCP. Able to perform thumb adduction, abduction, adduction, and extenion; unable to bring thumb into flexion towards palm. Pt able to passively move digits into flexion but noting intitation of contracture and muscle tightening. Unable to perform finger opposition. Able to perform AROM at wrist, elbow, and shoulder. RUE Coordination: decreased fine motor   Lower Extremity Assessment Lower Extremity Assessment: Defer to PT evaluation;RLE deficits/detail;LLE deficits/detail RLE Deficits / Details: s/p R BKA revision; hip and knee grossly WFL LLE Deficits / Details: chronic L BKA (s/p 2016); hip and knee grossly WFL   Cervical / Trunk Assessment Cervical / Trunk Assessment: Normal   Communication Communication Communication: No difficulties   Cognition Arousal/Alertness: Awake/alert Behavior During Therapy: WFL for tasks assessed/performed Overall Cognitive Status: Within Functional Limits for tasks assessed                                     General Comments       Exercises Exercises: Amputee Amputee Exercises Hip Extension: AROM;Right;15 reps;Standing Hip ABduction/ADduction: AROM;Right;15 reps;Standing   Shoulder Instructions      Home Living Family/patient expects to be discharged to:: Private residence Living Arrangements: Alone Available Help at Discharge: Family;Available 24 hours/day Type of Home: House Home Access: Ramped entrance     Home Layout: One level         Bathroom Toilet: Standard Bathroom Accessibility: Yes   Home  Equipment: Walker - standard;Cane - single point;Wheelchair - manual;Tub bench   Additional Comments: W/c accessible      Prior Functioning/Environment Level of Independence: Needs assistance  Gait / Transfers Assistance Needed: Ambulates with bilateral prosthesis and SPC; since R BKA revisions, primarily uses w/c with LLE prosthetic, squat pivot transfers. Family drives ADL's / Homemaking Assistance Needed: Reports indep with ADLs. Has had some difficulty gripping with RLE, family assists when needing to sign checks, etc.            OT Problem List: Decreased strength;Decreased range of motion;Decreased activity tolerance;Impaired balance (sitting and/or standing);Decreased knowledge of use of DME or AE;Decreased knowledge of precautions;Pain;Impaired UE functional use      OT Treatment/Interventions: Self-care/ADL training;Therapeutic exercise;Energy conservation;DME and/or AE instruction;Therapeutic activities;Patient/family education    OT Goals(Current goals can be found in the care plan section) Acute Rehab OT Goals Patient Stated Goal: "Move my fingers again" OT Goal Formulation: With patient Time For Goal Achievement: 12/20/18 Potential to Achieve Goals: Good  OT Frequency: Min 3X/week   Barriers to D/C:            Co-evaluation              AM-PAC OT "6 Clicks" Daily Activity     Outcome Measure Help from another person eating meals?: A Little Help from another person taking care of personal  grooming?: A Little Help from another person toileting, which includes using toliet, bedpan, or urinal?: A Little Help from another person bathing (including washing, rinsing, drying)?: A Little Help from another person to put on and taking off regular upper body clothing?: None Help from another person to put on and taking off regular lower body clothing?: A Little 6 Click Score: 19   End of Session Equipment Utilized During Treatment: Rolling walker Nurse  Communication: Mobility status  Activity Tolerance: Patient tolerated treatment well Patient left: in chair;with call bell/phone within reach  OT Visit Diagnosis: Unsteadiness on feet (R26.81);Other abnormalities of gait and mobility (R26.89);Muscle weakness (generalized) (M62.81);History of falling (Z91.81)                Time: 1130-1151 OT Time Calculation (min): 21 min Charges:  OT General Charges $OT Visit: 1 Visit OT Evaluation $OT Eval Low Complexity: [place], OTR/L Acute Rehab Pager: [phone] Office: [place] 12/06/2018, 1:13 PM

## 2018-12-06 NOTE — Progress Notes (Signed)
Pharmacy Antibiotic Note  [name] is a 63 y.o. 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  S/p revision transtibial amputation on 12/03/2018. Pharmacy has been consulted for Vancomycin dosing. Last SCr on 12/03/2018 = 0.93.   Plan: Vancomycin 1750mg  IV x 1, then 1g IV q12h Vancomycin levels at steady state (late Monday/early Tuesday) Plan for 6 week course per discussion with Dr. Sharol Given Monitor renal function, cultures, clinical course  Height: 5\' 10"  (177.8 cm) Weight: 170 lb (77.1 kg) IBW/kg (Calculated) : 73  Temp (24hrs), Avg:98.3 F (36.8 C), Min:98.2 F (36.8 C), Max:98.5 F (36.9 C)  Recent Labs  Lab 12/03/18 0723  WBC 9.3  CREATININE 0.93    Estimated Creatinine Clearance: 86.1 mL/min (by C-G formula based on SCr of 0.93 mg/dL).    Allergies  Allergen Reactions  . Ace Inhibitors Cough  . Cefazolin     Possible cause of AKI, AIN    Antimicrobials this admission: 7/29 Clindamycin >> 8/1 8/1 Vancomycin >>   Microbiology results: 7/29 right leg tissue (tibia): abundant Diphtheroids (Corynebacterium species), moderate Streptococcus mitis/oralis (S to Vancomycin), no anaerobes isolated. Culture in progress x 5 days.  7/29 MRSA PCR: negative 7/29 COVID: negative   Thank you for allowing pharmacy to be a part of this patient's care.   Lindell Spar, PharmD, BCPS Clinical Pharmacist  12/06/2018 4:06 PM

## 2018-12-06 NOTE — Progress Notes (Signed)
Pt called and stated that his blood sugar is "dropping". RN came in room and found pt sweating and feeling hot. Blood sugar checked and it was 44. RN gave pt orange juice and crackers. Rechecked blood sugar: 69  Pt gave another orange juice and graham crackers. Rechecked blood sugar: 83 Will continue to monitor patient.

## 2018-12-06 NOTE — Progress Notes (Signed)
RN spoke with patient's niece Dabid Godown, and gave update on patient's status. All questions were answered.

## 2018-12-07 LAB — GLUCOSE, CAPILLARY
Glucose-Capillary: 149 mg/dL — ABNORMAL HIGH (ref 70–99)
Glucose-Capillary: 144 mg/dL — ABNORMAL HIGH (ref 70–99)
Glucose-Capillary: 71 mg/dL (ref 70–99)
Glucose-Capillary: 161 mg/dL — ABNORMAL HIGH (ref 70–99)

## 2018-12-07 LAB — CREATININE, SERUM
Creatinine, Ser: 0.9 mg/dL (ref 0.61–1.24)
GFR calc Af Amer: >60 mL/min
GFR calc non Af Amer: >60 mL/min

## 2018-12-07 NOTE — Progress Notes (Signed)
  Subjective: Patient stable.  Wound VAC in place on right leg   Objective: Vital signs in last 24 hours: Temp:  [98.2 F (36.8 C)-98.8 F (37.1 C)] 98.6 F (37 C) (08/02 0907) Pulse Rate:  [76-88] 88 (08/02 0907) Resp:  [16] 16 (08/02 0907) BP: (125-137)/(57-67) 137/67 (08/02 0907) SpO2:  [100 %] 100 % (08/02 0320)  Intake/Output from previous day: 08/01 0701 - 08/02 0700 In: 1585.8 [P.O.:900; I.V.:317.6; IV Piggyback:368.3] Out: 1800 [Urine:1800] Intake/Output this shift: Total I/O In: 410.3 [P.O.:360; I.V.:38.9; IV Piggyback:11.4] Out: 0   Exam:  No cellulitis present Compartment soft  Labs: No results for input(s): HGB in the last 72 hours. No results for input(s): WBC, RBC, HCT, PLT in the last 72 hours. Recent Labs    12/07/18 0400  CREATININE 0.90   No results for input(s): LABPT, INR in the last 72 hours.  Assessment/Plan: Plan at this time is continue with IV antibiotics for stump infection.  No evidence yet of wound problems below the VAC.   [name] 12/07/2018, 12:02 PM

## 2018-12-07 NOTE — Plan of Care (Signed)

## 2018-12-08 ENCOUNTER — Encounter (HOSPITAL_COMMUNITY): Payer: Self-pay | Admitting: *Deleted

## 2018-12-08 LAB — VANCOMYCIN, PEAK: Vancomycin Pk: 35 ug/mL (ref 30–40)

## 2018-12-08 LAB — GLUCOSE, CAPILLARY
Glucose-Capillary: 194 mg/dL — ABNORMAL HIGH (ref 70–99)
Glucose-Capillary: 111 mg/dL — ABNORMAL HIGH (ref 70–99)
Glucose-Capillary: 152 mg/dL — ABNORMAL HIGH (ref 70–99)
Glucose-Capillary: 64 mg/dL — ABNORMAL LOW (ref 70–99)
Glucose-Capillary: 135 mg/dL — ABNORMAL HIGH (ref 70–99)

## 2018-12-08 LAB — VANCOMYCIN, TROUGH: Vancomycin Tr: 18 ug/mL (ref 15–20)

## 2018-12-08 LAB — AEROBIC/ANAEROBIC CULTURE W GRAM STAIN (SURGICAL/DEEP WOUND)

## 2018-12-08 LAB — CREATININE, SERUM
Creatinine, Ser: 0.99 mg/dL (ref 0.61–1.24)
GFR calc Af Amer: >60 mL/min (ref >60)
GFR calc non Af Amer: >60 mL/min (ref >60)

## 2018-12-08 MED ORDER — VANCOMYCIN HCL 10 G IV SOLR
1750.0000 mg | Freq: Two times a day (BID) | INTRAVENOUS | Status: DC
  Administered 2018-12-09: 1750 mg via INTRAVENOUS
  Filled 2018-12-08 (×3): qty 1750

## 2018-12-08 NOTE — Progress Notes (Signed)
Pharmacy Antibiotic Note  [name] is a 63 y.o. 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  S/p revision transtibial amputation on 12/03/2018.   Vancomycin levels this PM  VT of 18, VP of 35 with a calculated AUC of 268  Plan: Increase Vancomycin to 1750 mg iv Q 12 Plan for 6 week course per discussion with Dr. Sharol Given Monitor renal function, cultures, clinical course  Height: 5\' 10"  (177.8 cm) Weight: 170 lb (77.1 kg) IBW/kg (Calculated) : 73  Temp (24hrs), Avg:98.3 F (36.8 C), Min:98 F (36.7 C), Max:98.5 F (36.9 C)  Recent Labs  Lab 12/03/18 0723 12/07/18 0400 12/08/18 0337 12/08/18 1745 12/08/18 2045  WBC 9.3  --   --   --   --   CREATININE 0.93 0.90 0.99  --   --   VANCOTROUGH  --   --   --  18  --   VANCOPEAK  --   --   --   --  35    Estimated Creatinine Clearance: 80.9 mL/min (by C-G formula based on SCr of 0.99 mg/dL).    Allergies  Allergen Reactions  . Ace Inhibitors Cough  . Cefazolin     Possible cause of AKI, AIN    Antimicrobials this admission: 7/29 Clindamycin >> 8/1 8/1 Vancomycin >>   Microbiology results: 7/29 right leg tissue (tibia): abundant Diphtheroids (Corynebacterium species), moderate Streptococcus mitis/oralis (S to Vancomycin), no anaerobes isolated. Culture in progress x 5 days.  7/29 MRSA PCR: negative 7/29 COVID: negative   Thank you for allowing pharmacy to be a part of this patient's care.   Thank you  Anette Guarneri, PharmD Clinical Pharmacist  12/08/2018 9:33 PM

## 2018-12-08 NOTE — Care Management Important Message (Signed)
Important Message  Patient Details  Name: Marcy Bogosian MRN: [id number] Date of Birth: 01-29-1956   Medicare Important Message Given:  Yes     Shelda Altes 12/08/2018, 2:34 PM

## 2018-12-08 NOTE — Progress Notes (Signed)
Patient ID: [name], male   DOB: [birth year], 63 y.o.   MRN: [id number] Patient is status post revision transtibial amputation.  Intramedullary cultures are sensitive to vancomycin.  Will keep patient in the hospital until the vancomycin dose can be stabilized for patient to be discharged on 6 weeks of IV antibiotics.  There is no drainage in the wound VAC canister his PICC line is in place.

## 2018-12-08 NOTE — Progress Notes (Signed)
Occupational Therapy Treatment Patient Details Name: Deontae Robson MRN: [id number] DOB: 06/27/1955 Today's Date: 12/08/2018    History of present illness Pt is a 63 y.o. male admitted 12/03/18 for revision of R transtibial amputation. 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   OT comments  Focus of session on R hand P/AROM. Tolerated well.  Follow Up Recommendations  Outpatient OT;Supervision - Intermittent    Equipment Recommendations  None recommended by OT    Recommendations for Other Services      Precautions / Restrictions Precautions Precautions: Fall Precaution Comments: L BKA prosthetic in room, R BKA wound vac Restrictions Weight Bearing Restrictions: Yes RLE Weight Bearing: Non weight bearing       Mobility Bed Mobility Overal bed mobility: Modified Independent             General bed mobility comments: HOB up  Transfers                 General transfer comment: pt declining OOB to chair, performed hand ROM exercises at EOB    Balance Overall balance assessment: Needs assistance   Sitting balance-Leahy Scale: Good                                     ADL either performed or assessed with clinical judgement   ADL                                               Vision       Perception     Praxis      Cognition Arousal/Alertness: Awake/alert Behavior During Therapy: WFL for tasks assessed/performed Overall Cognitive Status: Within Functional Limits for tasks assessed                                          Exercises Exercises: Other exercises Other Exercises Other Exercises: PROM (finger flexion and thumb opposition), AROM finger and thumb extension x 10   Shoulder Instructions       General Comments      Pertinent Vitals/ Pain       Pain Assessment: No/denies pain  Home Living                                          Prior Functioning/Environment              Frequency  Min 3X/week        Progress Toward Goals  OT Goals(current goals can now be found in the care plan section)  Progress towards OT goals: Progressing toward goals  Acute Rehab OT Goals Patient Stated Goal: "Move my fingers again" OT Goal Formulation: With patient Time For Goal Achievement: 12/20/18 Potential to Achieve Goals: Good  Plan Discharge plan remains appropriate    Co-evaluation                 AM-PAC OT "6 Clicks" Daily Activity     Outcome Measure   Help from another person eating meals?: A Little Help from another person taking care  of personal grooming?: A Little Help from another person toileting, which includes using toliet, bedpan, or urinal?: A Little Help from another person bathing (including washing, rinsing, drying)?: A Little Help from another person to put on and taking off regular upper body clothing?: None Help from another person to put on and taking off regular lower body clothing?: A Little 6 Click Score: 19    End of Session    OT Visit Diagnosis: Unsteadiness on feet (R26.81);Other abnormalities of gait and mobility (R26.89);Muscle weakness (generalized) (M62.81);History of falling (Z91.81)   Activity Tolerance Patient tolerated treatment well   Patient Left in bed;with call bell/phone within reach;with nursing/sitter in room   Nurse Communication          Time: 0786-7544 OT Time Calculation (min): 32 min  Charges: OT General Charges $OT Visit: 1 Visit OT Treatments $Therapeutic Activity: 8-22 mins $Therapeutic Exercise: 8-22 mins  Nestor Lewandowsky, OTR/L Acute Rehabilitation Services Pager: [phone] Office: [phone]   Malka So 12/08/2018, 2:50 PM

## 2018-12-08 NOTE — Progress Notes (Addendum)
Physical Therapy Treatment Patient Details Name: [name] MRN: [id number] DOB: [birth year] Today's Date: 12/08/2018    History of Present Illness Pt is a 62 y.o. male admitted 12/03/18 for revision of R transtibial amputation. 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    PT Comments    Pt performed transfer OOB to recliner and from recliner back to bed.  He continues to require supervision as he is mildly unsteady during transfers.  Pt largely limited due to R hand strength deficits.  Based on presentation with defer follow up PT to post surgical healing to address gt training with new prosthesis.      Follow Up Recommendations  Other (comment)(Defer PT needs to post surgical healing when new prosthesis is fitted.  Pt has need for OT to address R hand.)     Equipment Recommendations  None recommended by PT    Recommendations for Other Services       Precautions / Restrictions Precautions Precautions: Fall Precaution Comments: L BKA prosthetic in room, R BKA wound vac Restrictions Weight Bearing Restrictions: Yes RLE Weight Bearing: Non weight bearing    Mobility  Bed Mobility Overal bed mobility: Modified Independent                Transfers Overall transfer level: Needs assistance Equipment used: Rolling walker (2 wheeled) Transfers: Stand Pivot Transfers   Stand pivot transfers: Supervision       General transfer comment: Pt performed OOB to recliner and back to bed, he performed out of bed to the L and back to bed to the R.  Pt mildly unsteady but require no physical assistance.  He lacks stability due to decreased use of R hand.  Ambulation/Gait Ambulation/Gait assistance: (NT Pt limited to transfers only due to decreased use of R hand.)               Stairs             Wheelchair Mobility    Modified Rankin (Stroke Patients Only)       Balance Overall balance assessment: Needs  assistance   Sitting balance-Leahy Scale: Good Sitting balance - Comments: Indep to don LLE prosthetic sitting EOB                                    Cognition Arousal/Alertness: Awake/alert Behavior During Therapy: WFL for tasks assessed/performed Overall Cognitive Status: Within Functional Limits for tasks assessed                                        Exercises      General Comments        Pertinent Vitals/Pain Pain Assessment: No/denies pain Faces Pain Scale: Hurts little more Pain Location: R residual limb where wound vac is Pain Descriptors / Indicators: Guarding Pain Intervention(s): Monitored during session;Repositioned    Home Living                      Prior Function            PT Goals (current goals can now be found in the care plan section) Acute Rehab PT Goals Patient Stated Goal: "Move my fingers again" Potential to Achieve Goals: Good Progress towards PT goals: Progressing toward goals  Frequency    Min 3X/week      PT Plan Discharge plan needs to be updated    Co-evaluation              AM-PAC PT "6 Clicks" Mobility   Outcome Measure  Help needed turning from your back to your side while in a flat bed without using bedrails?: None Help needed moving from lying on your back to sitting on the side of a flat bed without using bedrails?: None Help needed moving to and from a bed to a chair (including a wheelchair)?: None Help needed standing up from a chair using your arms (e.g., wheelchair or bedside chair)?: A Little Help needed to walk in hospital room?: A Lot Help needed climbing 3-5 steps with a railing? : Total 6 Click Score: 18    End of Session Equipment Utilized During Treatment: Other (comment)(LLE prosthetic) Activity Tolerance: Patient tolerated treatment well Patient left: in chair;with call bell/phone within reach Nurse Communication: Mobility status PT Visit Diagnosis:  Other abnormalities of gait and mobility (R26.89)     Time: 6720-9470 PT Time Calculation (min) (ACUTE ONLY): 15 min  Charges:  $Therapeutic Activity: 8-22 mins                     [name], PTA Acute Rehabilitation Services Pager [phone] Office [phone]     [name] 12/08/2018, 6:12 PM

## 2018-12-08 NOTE — Progress Notes (Signed)
Pt continues to complain about his right hand which is swollen and tender. He is unable to flex his fingers in that hand. Hand placed on pillow and pt encouraged to flex fingers to help reduce swelling.

## 2018-12-09 LAB — GLUCOSE, CAPILLARY
Glucose-Capillary: 180 mg/dL — ABNORMAL HIGH (ref 70–99)
Glucose-Capillary: 115 mg/dL — ABNORMAL HIGH (ref 70–99)

## 2018-12-09 MED ORDER — VANCOMYCIN HCL IN DEXTROSE 750-5 MG/150ML-% IV SOLN
750.0000 mg | Freq: Two times a day (BID) | INTRAVENOUS | Status: DC
  Filled 2018-12-09: qty 150

## 2018-12-09 MED ORDER — OXYCODONE-ACETAMINOPHEN 5-325 MG PO TABS: 1.0000 | ORAL_TABLET | ORAL | 0 refills | Status: AC | PRN

## 2018-12-09 MED ORDER — VANCOMYCIN IV (FOR PTA / DISCHARGE USE ONLY): 1750.0000 mg | Freq: Two times a day (BID) | INTRAVENOUS | 45 refills | Status: DC

## 2018-12-09 MED ORDER — HEPARIN SOD (PORK) LOCK FLUSH 100 UNIT/ML IV SOLN
250.0000 [IU] | INTRAVENOUS | Status: AC | PRN
  Administered 2018-12-09: 15:00:00 250 [IU]

## 2018-12-09 MED ORDER — VANCOMYCIN IV (FOR PTA / DISCHARGE USE ONLY): 750.0000 mg | Freq: Two times a day (BID) | INTRAVENOUS | 0 refills | Status: AC

## 2018-12-09 NOTE — Progress Notes (Signed)
PHARMACY CONSULT NOTE FOR:  OUTPATIENT  PARENTERAL ANTIBIOTIC THERAPY (OPAT)  Indication: Right Tibia Osteomyelitis  Regimen: Vancomycin 750 mg every 12 hours  End date: 01/20/2019  IV antibiotic discharge orders are pended. To discharging provider:  please sign these orders via discharge navigator,  Select New Orders & click on the button choice - Manage This Unsigned Work.    Noted that patient has had previous renal failure with Cefazolin per records so reasonable to avoid beta-lactam class. Please watch SCr closely on Vancomycin and consider switch to Daptomycin as appropriate if renal function worsens.   Thank you for allowing pharmacy to be a part of this patient's care.  Jimmy Footman, PharmD, BCPS, BCIDP Infectious Diseases Clinical Pharmacist Phone: [phone] 12/09/2018, 10:50 AM

## 2018-12-09 NOTE — Progress Notes (Signed)
Pharmacy Antibiotic Note  [name] is a 63 y.o. 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  S/p revision transtibial amputation on 12/03/2018.   Vancomycin levels were check last night   VT of 18, VP of 35 with a calculated AUC of 663.0 which is supratherapeutic   Plan: Decrease Vancomycin to 750 mg iv Q 12 for goal AUC of ~ 490  Plan for 6 week course per discussion with Dr. Sharol Given Monitor renal function, cultures, clinical course OPAT was updated to reflect this new dosage   Height: 5\' 10"  (177.8 cm) Weight: 170 lb (77.1 kg) IBW/kg (Calculated) : 73  Temp (24hrs), Avg:98.4 F (36.9 C), Min:98 F (36.7 C), Max:98.6 F (37 C)  Recent Labs  Lab 12/03/18 0723 12/07/18 0400 12/08/18 0337 12/08/18 1745 12/08/18 2045  WBC 9.3  --   --   --   --   CREATININE 0.93 0.90 0.99  --   --   VANCOTROUGH  --   --   --  18  --   VANCOPEAK  --   --   --   --  35    Estimated Creatinine Clearance: 80.9 mL/min (by C-G formula based on SCr of 0.99 mg/dL).    Allergies  Allergen Reactions  . Ace Inhibitors Cough  . Cefazolin     Possible cause of AKI, AIN    Antimicrobials this admission: 7/29 Clindamycin >> 8/1 8/1 Vancomycin >>   Microbiology results: 7/29 right leg tissue (tibia): abundant Diphtheroids (Corynebacterium species), moderate Streptococcus mitis/oralis (S to Vancomycin), no anaerobes isolated. Culture in progress x 5 days.  7/29 MRSA PCR: negative 7/29 COVID: negative   Thank you for allowing pharmacy to be a part of this patient's care.   Thank you  Jimmy Footman, PharmD, BCPS, [place] Infectious Diseases Clinical Pharmacist Phone: [phone] 12/09/2018 10:37 AM

## 2018-12-09 NOTE — TOC Transition Note (Signed)
Transition of Care [facility]) - CM/SW Discharge Note   Patient Details  Name: [name] MRN: [id number] Date of Birth: March 16, 1956  Transition of Care [facility]) CM/SW Contact:  Ninfa Meeker, RN Phone Number: [phone] (working remotely) 12/09/2018, 1:25 PM   Clinical Narrative:   Patient will discharge home on IV antibiotics and with prevena wound vac.Case Manager confirmed with Carolynn Sayers, IV antibiotic Specialist with [facility], that everything is in place for home.     Final next level of care: [place] Barriers to Discharge: No Barriers Identified   Patient Goals and CMS Choice Patient states their goals for this hospitalization and ongoing recovery are:: to go home CMS Medicare.gov Compare Post Acute Care list provided to:: Patient Choice offered to / list presented to : Patient  Discharge Placement                       Discharge Plan and Services     Post Acute Care Choice: Home Health                    HH Arranged: PT [facility] Agency: [place] Date [place]: 12/08/18 Time HH Agency Contacted: [id number] Representative spoke with at [place]: [place] Determinants of Health ([place]) Interventions     Readmission Risk Interventions No flowsheet data found.

## 2018-12-09 NOTE — Progress Notes (Signed)
Patient ID: [name], male   DOB: [birth year], 63 y.o.   MRN: [id number] Plan for discharge to home with IV antibiotic therapy with vancomycin.  Prescription written for 1750 mg dose every 12 hours.  Will have the wound VAC removed today prior to discharge with dry dressing change.  There is no drainage in the wound VAC canister.

## 2018-12-09 NOTE — Progress Notes (Signed)
PICC to right upper arm with dressing dry and intact, capped by IV team for home IV antibiotic. Discharge instructions given to pt. Discharged to home, picked up by his brother.

## 2018-12-09 NOTE — Discharge Summary (Signed)
Discharge Diagnoses:  Active Problems:   Dehiscence of amputation stump ([place])   Subacute osteomyelitis of right tibia [facility])   Below-knee amputation of right lower extremity ([place])   Surgeries: Procedure(s): REVISION RIGHT BELOW KNEE AMPUTATION on 12/03/2018    Consultants:   Discharged Condition: Improved  Hospital Course: [name] is an 63 y.o. male who was admitted 12/03/2018 with a chief complaint of osteomyelitis BKA, with a final diagnosis of DEHISCENCE RIGHT BELOW KNEE AMPUTATION.  Patient was brought to the operating room on 12/03/2018 and underwent Procedure(s): REVISION RIGHT BELOW KNEE AMPUTATION.    Patient was given perioperative antibiotics:  Anti-infectives (From admission, onward)   Start     Dose/Rate Route Frequency Ordered Stop   12/09/18 0600  vancomycin (VANCOCIN) 1,750 mg in sodium chloride 0.9 % 500 mL IVPB     1,750 mg 250 mL/hr over 120 Minutes Intravenous Every 12 hours 12/08/18 2135     12/09/18 0000  vancomycin IVPB     1,750 mg Intravenous Every 12 hours 12/09/18 0649     12/07/18 0600  vancomycin (VANCOCIN) IVPB 1000 mg/200 mL premix  Status:  Discontinued     1,000 mg 200 mL/hr over 60 Minutes Intravenous Every 12 hours 12/06/18 1606 12/08/18 2135   12/06/18 1630  vancomycin (VANCOCIN) 1,750 mg in sodium chloride 0.9 % 500 mL IVPB     1,750 mg 250 mL/hr over 120 Minutes Intravenous STAT 12/06/18 1602 12/06/18 1912   12/03/18 1600  clindamycin (CLEOCIN) IVPB 600 mg  Status:  Discontinued     600 mg 100 mL/hr over 30 Minutes Intravenous Every 8 hours 12/03/18 1351 12/06/18 1601   12/03/18 0730  clindamycin (CLEOCIN) IVPB 900 mg     900 mg 100 mL/hr over 30 Minutes Intravenous On call to O.R. 12/03/18 0721 12/03/18 0818   12/03/18 0725  clindamycin (CLEOCIN) 900 MG/50ML IVPB    Note to Pharmacy: Tamsen Snider   : cabinet override      12/03/18 0725 12/03/18 0818    .  Patient was given sequential compression devices, early ambulation,  and aspirin for DVT prophylaxis.  Recent vital signs:  Patient Vitals for the past 24 hrs:  BP Temp Temp src Pulse Resp SpO2  12/09/18 0518 122/60 98.6 F (37 C) Oral 78 18 100 %  12/08/18 2113 123/66 98 F (36.7 C) Oral 81 18 100 %  12/08/18 1445 125/61 98.5 F (36.9 C) Oral 75 16 99 %  12/08/18 0910 125/69 98.3 F (36.8 C) Oral 78 16 99 %  .  Recent laboratory studies: No results found.  Discharge Medications:   Allergies as of 12/09/2018      Reactions   Ace Inhibitors Cough   Cefazolin    Possible cause of AKI, AIN      Medication List    STOP taking these medications   naproxen 500 MG tablet Commonly known as: NAPROSYN   silver sulfADIAZINE 1 % cream Commonly known as: Silvadene     TAKE these medications   amLODipine 2.5 MG tablet Commonly known as: NORVASC Take 2.5 mg by mouth daily.   Calcium Citrate + D3 250-200 MG-UNIT Tabs Generic drug: Calcium Citrate-Vitamin D Take 1 tablet by mouth 2 (two) times daily.   cholestyramine 4 g packet Commonly known as: QUESTRAN Take 1 packet by mouth every morning.   insulin glargine 100 UNIT/ML injection Commonly known as: LANTUS Inject 0.1 mLs (10 Units total) into the skin at bedtime. What changed: how much  to take   latanoprost 0.005 % ophthalmic solution Commonly known as: XALATAN Place 1 drop into both eyes at bedtime.   metoprolol tartrate 50 MG tablet Commonly known as: LOPRESSOR Take 1 tablet (50 mg total) by mouth 2 (two) times daily.   oxyCODONE-acetaminophen 5-325 MG tablet Commonly known as: PERCOCET/ROXICET Take 1 tablet by mouth every 4 (four) hours as needed.   pantoprazole 40 MG tablet Commonly known as: PROTONIX Take 40 mg by mouth daily.   simvastatin 20 MG tablet Commonly known as: ZOCOR Take 20 mg by mouth daily at 6 PM.   vancomycin  IVPB Inject 1,750 mg into the vein every 12 (twelve) hours. Indication:  Osteomyelitis tibia Last Day of Therapy:  01/20/19 Labs - _0 /04/20 6219          Diagnostic Studies: [place] Ekg Site Rite  Result Date: 12/05/2018 If Site Rite image not attached, placement could not be confirmed due to current cardiac rhythm.   Patient benefited maximally from their hospital stay and there were no complications.     Disposition: Discharge disposition: 01-Home or Self Care      Discharge Instructions    Call MD / Call 911   Complete by: As directed    If you experience chest pain or shortness of breath, CALL 911 and be transported to the hospital emergency room.  If you develope a fever above 101 F, pus (white drainage) or increased drainage or redness at the wound, or calf pain, call your  surgeon's office.   Constipation Prevention   Complete by: As directed    Drink plenty of fluids.  Prune juice may be helpful.  You may use a stool softener, such as Colace (over the counter) 100 mg twice a day.  Use MiraLax (over the counter) for constipation as needed.   Diet - low sodium heart healthy   Complete by: As directed    Face-to-face encounter (required for Medicare/Medicaid patients)   Complete by: As directed    I Newt Minion certify that this patient is under my care and that I, or a nurse practitioner or physician's assistant working with me, had a face-to-face encounter  that meets the physician face-to-face encounter requirements with this patient on 12/09/2018. The encounter with the patient was in whole, or in part for the following medical condition(s) which is the primary reason for home health care (List medical condition): IV antibiotic therapy   The encounter with the patient was in whole, or in part, for the following medical condition, which is the primary reason for home health care: IV antibiotics   I certify that, based on my findings, the following services are medically necessary home health services: Nursing   Reason for Medically Necessary Home Health Services: Skilled Nursing- Assessment and Training for Infusion Therapy, Line Care, and Infection Control   My clinical findings support the need for the above services: Unable to leave home safely without assistance and/or assistive device   Further, I certify that my clinical findings support that this patient is homebound due to: Ambulates short distances less than 300 feet   Home Health   Complete by: As directed    To provide the following care/treatments: RN   Home infusion instructions [facility] May follow [place] Dosing Protocol; May administer Cathflo as needed to maintain patency of vascular access device.; Flushing of vascular access device: per [facility] Protocol: 0.9% NaCl pre/post medica...    Complete by: As directed    Instructions: May follow [place] Dosing Protocol   Instructions: May administer Cathflo as needed to maintain patency of vascular access device.   Instructions: Flushing of vascular access device: per [facility] Protocol: 0.9% NaCl pre/post medication administration and prn patency; Heparin 100 u/ml, 59m for implanted ports and Heparin 10u/ml, 534mfor all other central venous catheters.   Instructions: May follow [facility] Anaphylaxis Protocol for First Dose Administration in the home: 0.9% NaCl at 25-50 ml/hr to maintain IV access for protocol meds. Epinephrine 0.3 ml IV/IM PRN and Benadryl 25-50 IV/IM PRN s/s of anaphylaxis.   Instructions: [facility] Coordinator (RN) to assist per patient IV care needs in the home PRN.   Increase activity slowly as tolerated   Complete by: As directed      Follow-up Information    DuNewt MinionMD In 1 week.   Specialty: Orthopedic Surgery Contact information: [phone]          Signed: MaNewt Minion/08/2018, 6:49 AM

## 2018-12-11 ENCOUNTER — Ambulatory Visit: Payer: Medicare Other | Admitting: Orthopedic Surgery

## 2018-12-15 ENCOUNTER — Telehealth: Payer: Self-pay

## 2018-12-15 NOTE — Telephone Encounter (Signed)
TEFL teacher, pharmacist with [facility] would like to if the pharmacist can manage dose for Vancomycin off of the patient's labs and what is the goal trough for Vancomycin?  Cb# is [phone].  Please advise.  Thank you.

## 2018-12-16 ENCOUNTER — Telehealth: Payer: Self-pay | Admitting: Orthopedic Surgery

## 2018-12-16 NOTE — Telephone Encounter (Signed)
duda patient.  

## 2018-12-16 NOTE — Telephone Encounter (Signed)
I called and gave verbal ok for the pt to have OT for right wrist and hand.

## 2018-12-16 NOTE — Telephone Encounter (Signed)
Anderson Malta with [facility] called to request an order for OT to evaluation right hand/wrist function.  She also wanted Dr. Sharol Given to know that PT will pick back up once the wound nurse address the wound.  CB#[phone].  Thank you.

## 2018-12-16 NOTE — Telephone Encounter (Signed)
Noted! Thank you

## 2018-12-23 ENCOUNTER — Telehealth: Payer: Self-pay | Admitting: Orthopedic Surgery

## 2018-12-23 ENCOUNTER — Inpatient Hospital Stay: Payer: Medicare Other | Admitting: Orthopedic Surgery

## 2018-12-23 DIAGNOSIS — D649 Anemia, unspecified: Secondary | ICD-10-CM | POA: Insufficient documentation

## 2018-12-23 NOTE — Telephone Encounter (Signed)
Patient called advised his doctor is sending him to the ER at [facility] because he may have congestive Heart failure. Patient rescheduled his post op appointment for tomorrow. The number to contact patient is [phone]

## 2018-12-23 NOTE — Telephone Encounter (Signed)
Just an FYI

## 2018-12-24 ENCOUNTER — Inpatient Hospital Stay: Payer: Medicare Other | Admitting: Family

## 2018-12-24 DIAGNOSIS — I5032 Chronic diastolic (congestive) heart failure: Secondary | ICD-10-CM | POA: Insufficient documentation

## 2018-12-24 DIAGNOSIS — R7881 Bacteremia: Secondary | ICD-10-CM | POA: Insufficient documentation

## 2018-12-30 ENCOUNTER — Ambulatory Visit (INDEPENDENT_AMBULATORY_CARE_PROVIDER_SITE_OTHER): Payer: Medicare Other | Admitting: Orthopedic Surgery

## 2018-12-30 ENCOUNTER — Encounter: Payer: Self-pay | Admitting: Family

## 2018-12-30 VITALS — Ht 70.0 in | Wt 170.0 lb

## 2018-12-30 DIAGNOSIS — Z89511 Acquired absence of right leg below knee: Secondary | ICD-10-CM

## 2019-01-01 DIAGNOSIS — Z79899 Other long term (current) drug therapy: Secondary | ICD-10-CM

## 2019-01-02 ENCOUNTER — Telehealth: Payer: Self-pay | Admitting: Orthopedic Surgery

## 2019-01-02 NOTE — Telephone Encounter (Signed)
Ok

## 2019-01-02 NOTE — Telephone Encounter (Signed)
I left voicemail advising. ?

## 2019-01-02 NOTE — Telephone Encounter (Signed)
[name], OT, from [facility] left a message requesting VO for OT plan of treatment for ADL, hand strength and ROM.  CB#[phone].  Thank you.

## 2019-01-02 NOTE — Telephone Encounter (Signed)
Ok for orders? 

## 2019-01-03 ENCOUNTER — Encounter: Payer: Self-pay | Admitting: Orthopedic Surgery

## 2019-01-03 NOTE — Progress Notes (Signed)
Office Visit Note   Patient: [name]           Date of Birth: [birth year]           MRN: [id number] Visit Date: 12/30/2018              Requested by: No referring provider defined for this encounter. PCP: Patient, No Pcp Per  Chief Complaint  Patient presents with  . Right Leg - Routine Post Op    11/30/18 revision right BKA   . Right Hand - Pain      HPI: Patient is a 63 year old gentleman who presents 4 weeks status post revision right transtibial amputation.  The sutures are in place there is no shrinker he feels well.  Patient states he has less swelling in the right hand he is currently working with therapy to improve his function.  Assessment & Plan: Visit Diagnoses:  1. Acquired absence of right lower extremity below knee ([place])     Plan: Sutures are harvested recommended he continue his compression stocking follow-up with the orthotist for prosthetic fitting.  Patient states that he is just started Lasix with potassium supplement.  Follow-Up Instructions: Return in about 4 weeks (around 01/27/2019).   Ortho Exam  Patient is alert, oriented, no adenopathy, well-dressed, normal affect, normal respiratory effort. Examination patient's right hand has less swelling he is improving his range of motion.  The residual limb has healed quite nicely.  Imaging: No results found.   Labs: Lab Results  Component Value Date   HGBA1C 7.1 (H) 12/03/2018   HGBA1C 8.5 (H) 01/16/2018   HGBA1C 9.9 (H) 07/28/2014   ESRSEDRATE 46 (H) 03/20/2018   ESRSEDRATE 138 (H) 02/13/2018   ESRSEDRATE 40 01/23/2018   CRP 16.2 (H) 03/20/2018   CRP 11.8 (H) 01/17/2018   CRP 15.6 (H) 01/16/2018   LABURIC 3.4 (L) 12/03/2018   LABURIC 3.6 (L) 10/25/2018   REPTSTATUS 12/08/2018 FINAL 12/03/2018   GRAMSTAIN  12/03/2018    ABUNDANT WBC PRESENT,BOTH PMN AND MONONUCLEAR RARE GRAM POSITIVE COCCI    CULT  12/03/2018    ABUNDANT DIPHTHEROIDS(CORYNEBACTERIUM SPECIES) MODERATE STREPTOCOCCUS  MITIS/ORALIS NO ANAEROBES ISOLATED Performed at [facility], [place] [address] [place], [place] [address]    LABORGA STREPTOCOCCUS MITIS/ORALIS 12/03/2018     Lab Results  Component Value Date   ALBUMIN 1.8 (L) 03/11/2018   ALBUMIN 1.7 (L) 03/10/2018   ALBUMIN 1.7 (L) 03/09/2018   PREALBUMIN 16.6 (L) 05/09/2009   PREALBUMIN 12.2 (L) 05/02/2009   PREALBUMIN 7.4 (L) 04/25/2009   LABURIC 3.4 (L) 12/03/2018   LABURIC 3.6 (L) 10/25/2018    Lab Results  Component Value Date   MG 1.5 (L) 03/08/2018   MG 1.5 (L) 03/02/2018   MG 2.1 01/31/2018   No results found for: VD25OH  Lab Results  Component Value Date   PREALBUMIN 16.6 (L) 05/09/2009   PREALBUMIN 12.2 (L) 05/02/2009   PREALBUMIN 7.4 (L) 04/25/2009   CBC EXTENDED Latest Ref Rng & Units 12/03/2018 10/24/2018 03/10/2018  WBC 4.0 - 10.5 K/uL 9.3 12.6(H) 12.7(H)  RBC 4.22 - 5.81 MIL/uL 3.47(L) 3.20(L) 3.55(L)  HGB 13.0 - 17.0 g/dL 9.3(L) 8.8(L) 9.6(L)  HCT 39.0 - 52.0 % 30.5(L) 28.1(L) 32.9(L)  PLT 150 - 400 K/uL 214 196 147(L)  NEUTROABS 1.7 - 7.7 K/uL - - 10.7(H)  LYMPHSABS 0.7 - 4.0 K/uL - - 1.0     Body mass index is 24.39 kg/m.  Orders:  No orders of the defined  types were placed in this encounter.  No orders of the defined types were placed in this encounter.    Procedures: No procedures performed  Clinical Data: No additional findings.  ROS:  All other systems negative, except as noted in the HPI. Review of Systems  Objective: Vital Signs: Ht 5\' 10"  (1.778 m)   Wt 170 lb (77.1 kg)   BMI 24.39 kg/m   Specialty Comments:  No specialty comments available.  PMFS History: Patient Active Problem List   Diagnosis Date Noted  . Below-knee amputation of right lower extremity ([place]) 12/03/2018  . Subacute osteomyelitis of right tibia ([place])   . Surgery follow-up 10/24/2018  . Acquired absence of right lower extremity below knee ([place]) 10/24/2018  . Dehiscence of amputation stump ([place])   .  Encounter for removal of biliary stent   . Bile leak   . Bile leak, postoperative   . Acute gangrenous cholecystitis s/p lap cholecystectomy 02/28/2018 03/02/2018  . AKI (acute kidney injury) ([place])   . Acute hypoxemic respiratory failure ([place]) 02/06/2018  . PAF (paroxysmal atrial fibrillation) ([place]) 02/05/2018  . Aortic atherosclerosis ([place]) 02/05/2018  . Acute renal failure (ARF) ([place]) 01/27/2018  . Hyponatremia 01/27/2018  . 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 S/P bilateral BKA (below knee amputation) ([place])   . Post-operative pain   . Benign essential HTN   . Diabetes mellitus type 2 in obese ([place])   . PVD (peripheral vascular disease) ([place])   . History of prosthetic mitral valve   . Prosthetic valve endocarditis ([place])   . Staphylococcus aureus bacteremia   . Staphylococcal arthritis of right wrist ([place])   . Splenic infarct 01/14/2018  . Renal insufficiency 01/14/2018  . Severe sepsis ([place]) 01/14/2018  . Dupuytren's disease of palm of both hands 03/18/2017  . S/P BKA (below knee amputation) unilateral, right ([place]) 12/08/2014  . Bicuspid aortic valve   . Hyperlipidemia   . Hypertension   . Greater trochanter fracture ([place]) 07/28/2014  . Hip fracture, right ([place]) 07/28/2014  . HLD (hyperlipidemia) 07/28/2014  . GERD (gastroesophageal reflux disease) 07/28/2014  . Hip fracture ([place]) 07/28/2014  . Fall 07/28/2014  . Leukocytosis 07/28/2014  . Thrombocytopenia ([place]) 07/28/2014  . H/O aortic valve replacement 10/16/2011  . 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 PLEURAL EFFUSION 05/25/2009  . OPEN WOUND OF CHEST , COMPLICATED 95/28/4132  . PROSTHETIC VALVE - BIO/ ENDOPROSTHESIS 05/25/2009  . Insulin-requiring or dependent type II diabetes mellitus ([place]) 04/19/2009  . HYPERLIPIDEMIA 04/19/2009  . Essential hypertension 04/19/2009  . CAD (coronary artery disease) 04/19/2009  . Peripheral vascular disease ([place]) 04/19/2009  . OSTEOMYELITIS 04/19/2009  . BICUSPID  AORTIC VALVE 04/19/2009   Past Medical History:  Diagnosis Date  . Abscess of right leg   . 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 Acute and subacute infective endocarditis   . Acute hematogenous osteomyelitis, right ankle and foot ([place])   . Bicuspid aortic valve    a. 02/2009 s/p tissue AVR;  b. 07/2014 Echo: EF 55-60%, basal-mid inf HK, mild AS/MR, mod dil LA, PASP 44 mmHg.  Marland Kitchen CAD (coronary artery disease) October 2010   a. 02/2009 NSTEMI/Cath: 3VD; b. 02/2009 CABG x 5: LIMA->LAD, VG->Diag, VG->RI, VG->RVM->RPDA.  . Cellulitis of left lower extremity 04/26/2016  . Chronic kidney disease 2019   stage 5 - not on dialysis  Kidney failure due antibiotic for bad infection, kidneys are working again per pt ARF   .  Gangrene of gallbladder in cholecystitis   . GERD (gastroesophageal reflux disease)   . Heart murmur   . Hyperlipidemia   . Hypertension   . Myocardial infarction ([place]) 2010  . PAF (paroxysmal atrial fibrillation) ([place])    02/2018  . PVD (peripheral vascular disease) ([place])    a. s/p toe amputations on L foot.  . Right foot infection 01/15/2018  . Sepsis due to methicillin susceptible Staphylococcus aureus ([place])   . Type II diabetes mellitus (HCC)     Family History  Problem Relation Age of Onset  . Heart attack Father        early age  . Heart disease Father   . Stroke Father   . Heart attack Brother        At age 64  . Stroke Brother   . Diabetes type II Mother   . Diabetes type II Sister   . Diabetes Other   . Coronary artery disease Other   . Colon cancer Neg Hx   . Esophageal cancer Neg Hx     Past Surgical History:  Procedure Laterality Date  . AMPUTATION Left 2011   great and second toes  . AMPUTATION  07/27/2011   Procedure: AMPUTATION FOOT;  Surgeon: Newt Minion, MD;  Location: [facility];  Service: Orthopedics;  Laterality: Left;  Left Midfoot Amputation   . AMPUTATION Left 12/08/2014   Procedure: Left Below Knee Amputation;  Surgeon:  Newt Minion, MD;  Location: [facility];  Service: Orthopedics;  Laterality: Left;  . AMPUTATION Right 01/17/2018   Procedure: RIGHT BELOW KNEE AMPUTATION;  Surgeon: Newt Minion, MD;  Location: [facility];  Service: Orthopedics;  Laterality: Right;  . AMPUTATION TOE Right    "all my toes on my right foot have been amputated"  . AORTIC VALVE REPLACEMENT  2010   Pericardial tissue valve  . BILIARY STENT PLACEMENT  03/06/2018   Procedure: BILIARY STENT PLACEMENT;  Surgeon: Irene Shipper, MD;  Location: [facility] ENDOSCOPY;  Service: Endoscopy;;  . CARDIAC CATHETERIZATION  2010  . CARDIAC VALVE REPLACEMENT    . CATARACT EXTRACTION W/ INTRAOCULAR LENS  IMPLANT, BILATERAL Bilateral   . CHOLECYSTECTOMY N/A 02/28/2018   Procedure: LAPAROSCOPIC CHOLECYSTECTOMY;  Surgeon: Coralie Keens, MD;  Location: [facility];  Service: General;  Laterality: N/A;  . CORONARY ARTERY BYPASS GRAFT  2010   "CABG X5"  . ERCP N/A 03/06/2018   Procedure: ENDOSCOPIC RETROGRADE CHOLANGIOPANCREATOGRAPHY (ERCP);  Surgeon: Irene Shipper, MD;  Location[facility] END[facility]COPY;  Service: Endoscopy;  Laterality: N/A;  . ERCP N/A 05/26/2018   Procedure: ENDOSCOPIC RETROGRADE CHOLANGIOPANCREATOGRAPHY (ERCP);  Surgeon: Irene Shipper, MD;  Location: [facility];  Service: Endoscopy;  Laterality: N/A;  . IR FLUORO GUIDE CV LINE RIGHT  02/13/2018  . IR REMOVAL TUN CV CATH W/O FL  02/24/2018  . IR US GUIDE VASC ACCESS RIGHT  02/13/2018  . REMOVAL OF STONES  03/06/2018   Procedure: REMOVAL OF STONES;  Surgeon: Irene Shipper, MD;  Location: [facility] ENDOSCOPY;  Service: Endoscopy;;  . Joan Mayans  03/06/2018   Procedure: Joan Mayans;  Surgeon: Irene Shipper, MD;  Location: [facility] ENDOSCOPY;  Service: Endoscopy;;  . Lavell Islam REMOVAL  05/26/2018   Procedure: STENT REMOVAL;  Surgeon: Irene Shipper, MD;  Location: [facility] ENDOSCOPY;  Service: Endoscopy;;  . STUMP REVISION Right 10/24/2018  . STUMP REVISION Right 10/24/2018   Procedure: REVISION RIGHT BELOW KNEE AMPUTATION;   Surgeon: Newt Minion, MD;  Location: [facility];  Service:  Orthopedics;  Laterality: Right;  . STUMP REVISION Right 12/03/2018  . STUMP REVISION Right 12/03/2018   Procedure: REVISION RIGHT BELOW KNEE AMPUTATION;  Surgeon: Newt Minion, MD;  Location: [facility];  Service: Orthopedics;  Laterality: Right;  . TEE WITHOUT CARDIOVERSION N/A 01/21/2018   Procedure: TRANSESOPHAGEAL ECHOCARDIOGRAM (TEE);  Surgeon: Buford Dresser, MD;  Location: [facility] ENDOSCOPY;  Service: Cardiovascular;  Laterality: N/A;   Social History   Occupational History  . Occupation: Publishing rights manager: [employer]  Tobacco Use  . Smoking status: Former Smoker    Packs/day: 1.00    Years: 24.00    Pack years: 24.00    Types: Cigarettes    Quit date: 10/15/2000    Years since quitting: 18.2  . Smokeless tobacco: Never Used  Substance and Sexual Activity  . Alcohol use: Not Currently    Comment: 01/15/2018 "1-2 drinks/month"  . Drug use: No  . Sexual activity: Not Currently

## 2019-01-22 ENCOUNTER — Telehealth: Payer: Self-pay | Admitting: Orthopedic Surgery

## 2019-01-22 NOTE — Telephone Encounter (Signed)
Nurse called in working with the pt said he completed IV antibiotics on Tuesday 01-20-2019 and she is now needing a verbal order to remove the picc line.    [phone]

## 2019-01-22 NOTE — Telephone Encounter (Signed)
Ok to give order for [facility]  to D/C PICC line? Pt has completed ABX

## 2019-01-22 NOTE — Telephone Encounter (Signed)
Yes, thank you.

## 2019-01-22 NOTE — Telephone Encounter (Signed)
I called and sw nurse to advise ok to pull PICC line. She will remove tomorrow morning.

## 2019-01-29 ENCOUNTER — Encounter: Payer: Self-pay | Admitting: Orthopedic Surgery

## 2019-01-29 ENCOUNTER — Ambulatory Visit (INDEPENDENT_AMBULATORY_CARE_PROVIDER_SITE_OTHER): Payer: Medicare Other | Admitting: Orthopedic Surgery

## 2019-01-29 VITALS — Ht 70.0 in | Wt 170.0 lb

## 2019-01-29 DIAGNOSIS — Z89511 Acquired absence of right leg below knee: Secondary | ICD-10-CM

## 2019-01-29 DIAGNOSIS — T8781 Dehiscence of amputation stump: Secondary | ICD-10-CM

## 2019-02-03 ENCOUNTER — Encounter: Payer: Self-pay | Admitting: Orthopedic Surgery

## 2019-02-03 NOTE — Progress Notes (Signed)
Office Visit Note   Patient: Tyress Loden           Date of Birth: 02/06/1956           MRN: [id number] Visit Date: 01/29/2019              Requested by: No referring provider defined for this encounter. PCP: Patient, No Pcp Per  Chief Complaint  Patient presents with  . Right Leg - Routine Post Op    10/24/2018 right BKA revision      HPI: Patient is a 63 year old gentleman who presents 2 months status post revision right transtibial amputation he is currently ambulating with a prosthesis today and a walker he feels well has no concerns.  Assessment & Plan: Visit Diagnoses:  1. Acquired absence of right lower extremity below knee ([place])   2. Dehiscence of amputation stump ([place])     Plan: Patient will continue to increase his activities as tolerated he is currently living in [facility] with follow-up with orthotist there.  Follow-Up Instructions: Return if symptoms worsen or fail to improve.   Ortho Exam  Patient is alert, oriented, no adenopathy, well-dressed, normal affect, normal respiratory effort. Examination the residual limb is well consolidated no ulcers no cellulitis no signs of infection.  He has good fitting prosthesis.  Imaging: No results found. No images are attached to the encounter.  Labs: Lab Results  Component Value Date   HGBA1C 7.1 (H) 12/03/2018   HGBA1C 8.5 (H) 01/16/2018   HGBA1C 9.9 (H) 07/28/2014   ESRSEDRATE 46 (H) 03/20/2018   ESRSEDRATE 138 (H) 02/13/2018   ESRSEDRATE 40 01/23/2018   CRP 16.2 (H) 03/20/2018   CRP 11.8 (H) 01/17/2018   CRP 15.6 (H) 01/16/2018   LABURIC 3.4 (L) 12/03/2018   LABURIC 3.6 (L) 10/25/2018   REPTSTATUS 12/08/2018 FINAL 12/03/2018   GRAMSTAIN  12/03/2018    ABUNDANT WBC PRESENT,BOTH PMN AND MONONUCLEAR RARE GRAM POSITIVE COCCI    CULT  12/03/2018    ABUNDANT DIPHTHEROIDS(CORYNEBACTERIUM SPECIES) MODERATE STREPTOCOCCUS MITIS/ORALIS NO ANAEROBES ISOLATED Performed at [facility], [place] [address] [place], [place] [address]    LABORGA STREPTOCOCCUS MITIS/ORALIS 12/03/2018     Lab Results  Component Value Date   ALBUMIN 1.8 (L) 03/11/2018   ALBUMIN 1.7 (L) 03/10/2018   ALBUMIN 1.7 (L) 03/09/2018   PREALBUMIN 16.6 (L) 05/09/2009   PREALBUMIN 12.2 (L) 05/02/2009   PREALBUMIN 7.4 (L) 04/25/2009   LABURIC 3.4 (L) 12/03/2018   LABURIC 3.6 (L) 10/25/2018    Lab Results  Component Value Date   MG 1.5 (L) 03/08/2018   MG 1.5 (L) 03/02/2018   MG 2.1 01/31/2018   No results found for: VD25OH  Lab Results  Component Value Date   PREALBUMIN 16.6 (L) 05/09/2009   PREALBUMIN 12.2 (L) 05/02/2009   PREALBUMIN 7.4 (L) 04/25/2009   CBC EXTENDED Latest Ref Rng & Units 12/03/2018 10/24/2018 03/10/2018  WBC 4.0 - 10.5 K/uL 9.3 12.6(H) 12.7(H)  RBC 4.22 - 5.81 MIL/uL 3.47(L) 3.20(L) 3.55(L)  HGB 13.0 - 17.0 g/dL 9.3(L) 8.8(L) 9.6(L)  HCT 39.0 - 52.0 % 30.5(L) 28.1(L) 32.9(L)  PLT 150 - 400 K/uL 214 196 147(L)  NEUTROABS 1.7 - 7.7 K/uL - - 10.7(H)  LYMPHSABS 0.7 - 4.0 K/uL - - 1.0     Body mass index is 24.39 kg/m.  Orders:  No orders of the defined types were placed in this encounter.  No orders of the defined types were placed in  this encounter.    Procedures: No procedures performed  Clinical Data: No additional findings.  ROS:  All other systems negative, except as noted in the HPI. Review of Systems  Objective: Vital Signs: Ht 5\' 10"  (1.778 m)   Wt 170 lb (77.1 kg)   BMI 24.39 kg/m   Specialty Comments:  No specialty comments available.  PMFS History: Patient Active Problem List   Diagnosis Date Noted  . Below-knee amputation of right lower extremity ([place]) 12/03/2018  . Subacute osteomyelitis of right tibia ([place])   . Surgery follow-up 10/24/2018  . Acquired absence of right lower extremity below knee ([place]) 10/24/2018  . Dehiscence of amputation stump ([place])   . Encounter for removal of biliary stent   . Bile leak   . Bile  leak, postoperative   . Acute gangrenous cholecystitis s/p lap cholecystectomy 02/28/2018 03/02/2018  . AKI (acute kidney injury) ([place])   . Acute hypoxemic respiratory failure ([place]) 02/06/2018  . PAF (paroxysmal atrial fibrillation) ([place]) 02/05/2018  . Aortic atherosclerosis ([place]) 02/05/2018  . Acute renal failure (ARF) ([place]) 01/27/2018  . Hyponatremia 01/27/2018  . 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 S/P bilateral BKA (below knee amputation) ([place])   . Post-operative pain   . Benign essential HTN   . Diabetes mellitus type 2 in obese ([place])   . PVD (peripheral vascular disease) ([place])   . History of prosthetic mitral valve   . Prosthetic valve endocarditis ([place])   . Staphylococcus aureus bacteremia   . Staphylococcal arthritis of right wrist ([place])   . Splenic infarct 01/14/2018  . Renal insufficiency 01/14/2018  . Severe sepsis ([place]) 01/14/2018  . Dupuytren's disease of palm of both hands 03/18/2017  . S/P BKA (below knee amputation) unilateral, right ([place]) 12/08/2014  . Bicuspid aortic valve   . Hyperlipidemia   . Hypertension   . Greater trochanter fracture ([place]) 07/28/2014  . Hip fracture, right ([place]) 07/28/2014  . HLD (hyperlipidemia) 07/28/2014  . GERD (gastroesophageal reflux disease) 07/28/2014  . Hip fracture ([place]) 07/28/2014  . Fall 07/28/2014  . Leukocytosis 07/28/2014  . Thrombocytopenia ([place]) 07/28/2014  . H/O aortic valve replacement 10/16/2011  . 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 PLEURAL EFFUSION 05/25/2009  . OPEN WOUND OF CHEST , COMPLICATED 93/71/6967  . PROSTHETIC VALVE - BIO/ ENDOPROSTHESIS 05/25/2009  . Insulin-requiring or dependent type II diabetes mellitus ([place]) 04/19/2009  . HYPERLIPIDEMIA 04/19/2009  . Essential hypertension 04/19/2009  . CAD (coronary artery disease) 04/19/2009  . Peripheral vascular disease ([place]) 04/19/2009  . OSTEOMYELITIS 04/19/2009  . BICUSPID AORTIC VALVE 04/19/2009   Past Medical History:  Diagnosis Date   . Abscess of right leg   . 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 Acute and subacute infective endocarditis   . Acute hematogenous osteomyelitis, right ankle and foot ([place])   . Bicuspid aortic valve    a. 02/2009 s/p tissue AVR;  b. 07/2014 Echo: EF 55-60%, basal-mid inf HK, mild AS/MR, mod dil LA, PASP 44 mmHg.  Marland Kitchen CAD (coronary artery disease) October 2010   a. 02/2009 NSTEMI/Cath: 3VD; b. 02/2009 CABG x 5: LIMA->LAD, VG->Diag, VG->RI, VG->RVM->RPDA.  . Cellulitis of left lower extremity 04/26/2016  . Chronic kidney disease 2019   stage 5 - not on dialysis  Kidney failure due antibiotic for bad infection, kidneys are working again per pt ARF   . Gangrene of gallbladder in cholecystitis   . GERD (gastroesophageal reflux disease)   .  Heart murmur   . Hyperlipidemia   . Hypertension   . Myocardial infarction ([place]) 2010  . PAF (paroxysmal atrial fibrillation) ([place])    02/2018  . PVD (peripheral vascular disease) ([place])    a. s/p toe amputations on L foot.  . Right foot infection 01/15/2018  . Sepsis due to methicillin susceptible Staphylococcus aureus ([place])   . Type II diabetes mellitus (HCC)     Family History  Problem Relation Age of Onset  . Heart attack Father        early age  . Heart disease Father   . Stroke Father   . Heart attack Brother        At age 65  . Stroke Brother   . Diabetes type II Mother   . Diabetes type II Sister   . Diabetes Other   . Coronary artery disease Other   . Colon cancer Neg Hx   . Esophageal cancer Neg Hx     Past Surgical History:  Procedure Laterality Date  . AMPUTATION Left 2011   great and second toes  . AMPUTATION  07/27/2011   Procedure: AMPUTATION FOOT;  Surgeon: Newt Minion, MD;  Location: [facility];  Service: Orthopedics;  Laterality: Left;  Left Midfoot Amputation   . AMPUTATION Left 12/08/2014   Procedure: Left Below Knee Amputation;  Surgeon: Newt Minion, MD;  Location: [facility];  Service: Orthopedics;   Laterality: Left;  . AMPUTATION Right 01/17/2018   Procedure: RIGHT BELOW KNEE AMPUTATION;  Surgeon: Newt Minion, MD;  Location: [facility];  Service: Orthopedics;  Laterality: Right;  . AMPUTATION TOE Right    "all my toes on my right foot have been amputated"  . AORTIC VALVE REPLACEMENT  2010   Pericardial tissue valve  . BILIARY STENT PLACEMENT  03/06/2018   Procedure: BILIARY STENT PLACEMENT;  Surgeon: Irene Shipper, MD;  Location: [facility] ENDOSCOPY;  Service: Endoscopy;;  . CARDIAC CATHETERIZATION  2010  . CARDIAC VALVE REPLACEMENT    . CATARACT EXTRACTION W/ INTRAOCULAR LENS  IMPLANT, BILATERAL Bilateral   . CHOLECYSTECTOMY N/A 02/28/2018   Procedure: LAPAROSCOPIC CHOLECYSTECTOMY;  Surgeon: Coralie Keens, MD;  Location: [place];  Service: General;  Laterality: N/A;  . CORONARY ARTERY BYPASS GRAFT  2010   "CABG X5"  . ERCP N/A 03/06/2018   Procedure: ENDOSCOPIC RETROGRADE CHOLANGIOPANCREATOGRAPHY (ERCP);  Surgeon: Irene Shipper, MD;  Location: [facility] ENDOSCOPY;  Service: Endoscopy;  Laterality: N/A;  . ERCP N/A 05/26/2018   Procedure: ENDOSCOPIC RETROGRADE CHOLANGIOPANCREATOGRAPHY (ERCP);  Surgeon: Irene Shipper, MD;  Location[facility];  Service: Endoscopy;  Laterality: N/A;  . IR FLUORO GUIDE CV LINE RIGHT  02/13/2018  . IR REMOVAL TUN CV CATH W/O FL  02/24/2018  . IR US GUIDE VASC ACCESS RIGHT  02/13/2018  . REMOVAL OF STONES  03/06/2018   Procedure: REMOVAL OF STONES;  Surgeon: Irene Shipper, MD;  Location: [facility] ENDOSCOPY;  Service: Endoscopy;;  . Joan Mayans  03/06/2018   Procedure: Joan Mayans;  Surgeon: Irene Shipper, MD;  Location: [facility] ENDOSCOPY;  Service: Endoscopy;;  . Lavell Islam REMOVAL  05/26/2018   Procedure: STENT REMOVAL;  Surgeon: Irene Shipper, MD;  Location[facility] ENDOSCOPY;  Service: Endoscopy;;  . STUMP REVISION Right 10/24/2018  . STUMP REVISION Right 10/24/2018   Procedure: REVISION RIGHT BELOW KNEE AMPUTATION;  Surgeon: Newt Minion, MD;  Location: [facility];  Service:  Orthopedics;  Laterality: Right;  . STUMP REVISION Right 12/03/2018  . STUMP REVISION Right  12/03/2018   Procedure: REVISION RIGHT BELOW KNEE AMPUTATION;  Surgeon: Newt Minion, MD;  Location: [facility];  Service: Orthopedics;  Laterality: Right;  . TEE WITHOUT CARDIOVERSION N/A 01/21/2018   Procedure: TRANSESOPHAGEAL ECHOCARDIOGRAM (TEE);  Surgeon: Buford Dresser, MD;  Location: [facility] ENDOSCOPY;  Service: Cardiovascular;  Laterality: N/A;   Social History   Occupational History  . Occupation: Publishing rights manager: [employer]  Tobacco Use  . Smoking status: Former Smoker    Packs/day: 1.00    Years: 24.00    Pack years: 24.00    Types: Cigarettes    Quit date: 10/15/2000    Years since quitting: 18.3  . Smokeless tobacco: Never Used  Substance and Sexual Activity  . Alcohol use: Not Currently    Comment: 01/15/2018 "1-2 drinks/month"  . Drug use: No  . Sexual activity: Not Currently

## 2019-03-06 ENCOUNTER — Other Ambulatory Visit: Payer: Self-pay | Admitting: Internal Medicine

## 2019-03-06 DIAGNOSIS — I480 Paroxysmal atrial fibrillation: Secondary | ICD-10-CM

## 2019-09-15 DIAGNOSIS — Z01818 Encounter for other preprocedural examination: Secondary | ICD-10-CM | POA: Insufficient documentation

## 2019-10-29 DIAGNOSIS — E559 Vitamin D deficiency, unspecified: Secondary | ICD-10-CM | POA: Insufficient documentation

## 2019-10-29 DIAGNOSIS — C4442 Squamous cell carcinoma of skin of scalp and neck: Secondary | ICD-10-CM | POA: Insufficient documentation

## 2019-10-29 DIAGNOSIS — Z5320 Procedure and treatment not carried out because of patient's decision for unspecified reasons: Secondary | ICD-10-CM | POA: Insufficient documentation

## 2020-01-17 IMAGING — MR MR FOOT*R* WO/W CM
9 of 10 series · 38 of 40 positions shown · IV contrast (gadavist)
Comparison: Radiographs 01/15/2018

CLINICAL DATA: Necrotic gangrenous ulcer on the lateral aspect of
the right foot with ascending cellulitis.

EXAM:
MRI OF THE RIGHT FOREFOOT WITHOUT AND WITH CONTRAST
TECHNIQUE: Multiplanar, multisequence MR imaging of the right foot was
performed before and after the administration of intravenous
contrast.
CONTRAST:  10 cc Gadavist

[Series 7: T1 · axial · right · 3.0mm · 0.52mm/px · z∈[-68,+51]mm · 4 of 32 slices shown (1 of 2)]
[im 1/32]
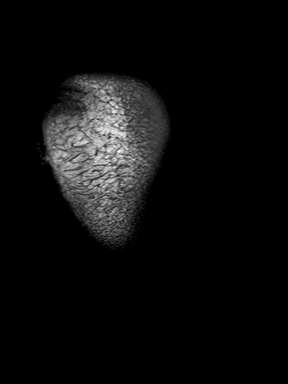
[im 11/32]
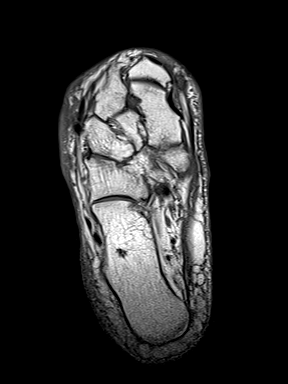
[im 21/32]
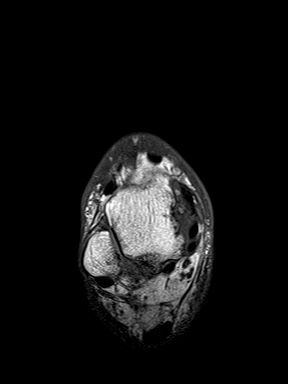
[im 32/32]
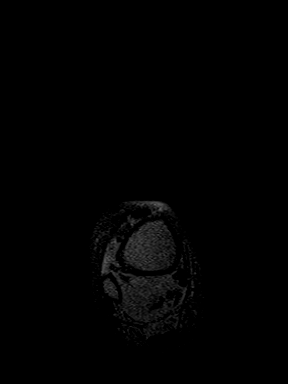

[Series 9: T2 fat-sat · coronal · right · 3.0mm · 0.47mm/px · 5 of 50 slices shown (1 of 2)]
[im 1/50]
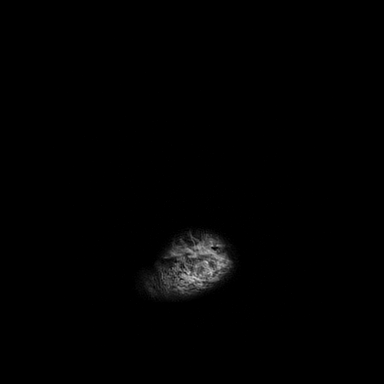
[im 13/50]
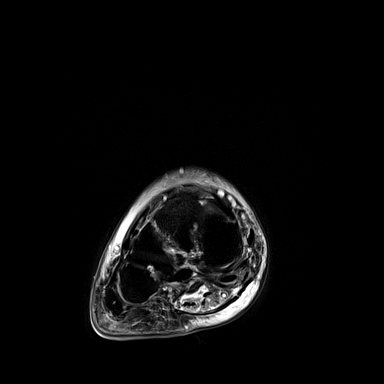
[im 25/50]
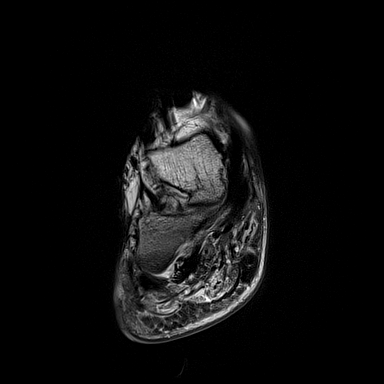
[im 37/50]
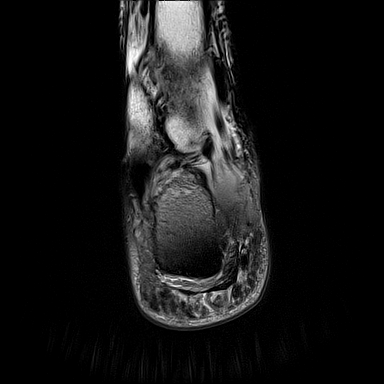
[im 50/50]
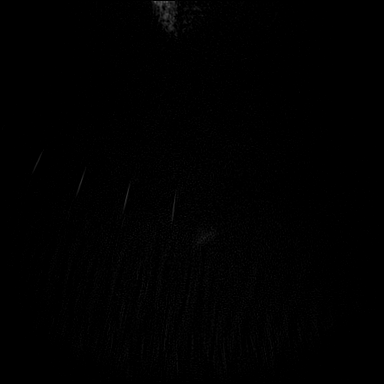

[Series 10: STIR · sagittal · right · 3.0mm · 0.70mm/px · 3 of 28 slices shown]
[im 1/28]
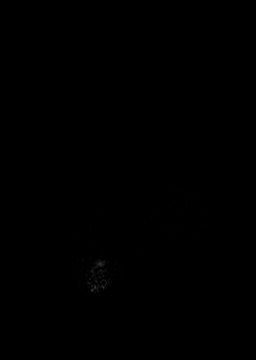
[im 14/28]
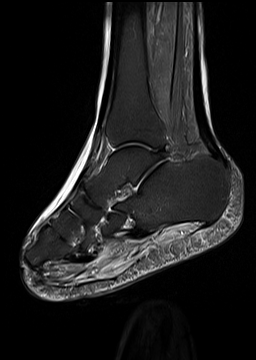
[im 28/28]
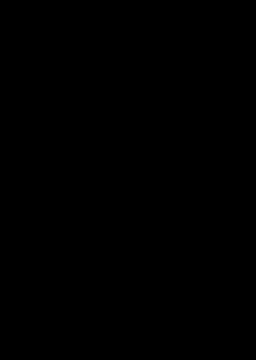

[Series 11: T1 · coronal · right · 3.0mm · 0.47mm/px · 5 of 50 slices shown (2 of 2)]
[im 1/50]
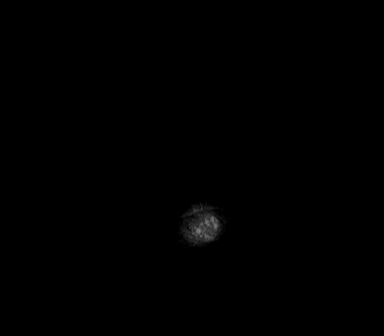
[im 13/50]
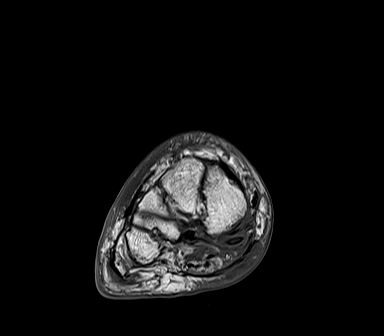
[im 25/50]
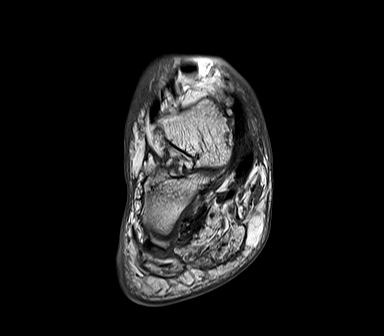
[im 37/50]
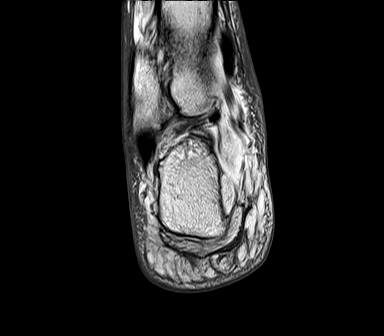
[im 50/50]
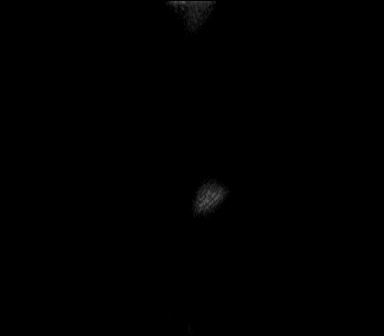

[Series 12: T2 fat-sat · axial · right · 3.0mm · 0.54mm/px · z∈[-78,+64]mm · 4 of 38 slices shown (2 of 2)]
[im 1/38]
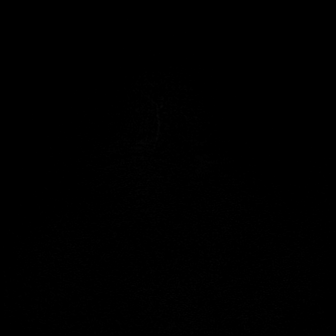
[im 13/38]
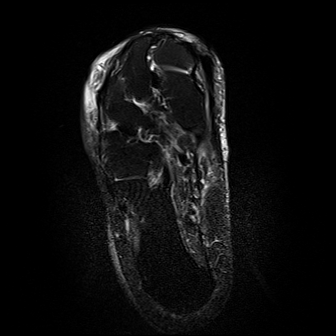
[im 25/38]
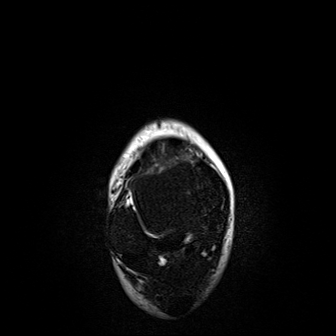
[im 38/38]
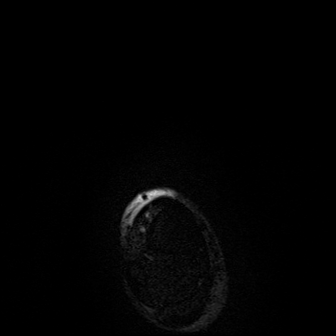

[Series 14: T1 fat-sat · coronal · non-contrast · right · 3.0mm · 0.59mm/px · 5 of 52 slices shown (1 of 3)]
[im 1/52]
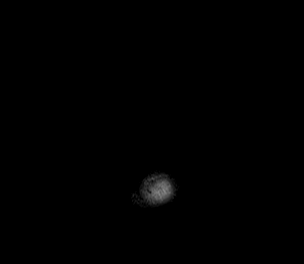
[im 13/52]
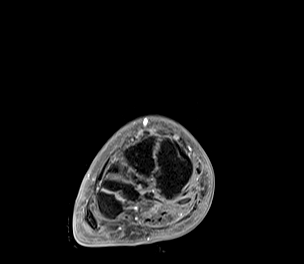
[im 26/52]
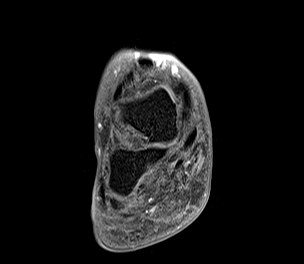
[im 39/52]
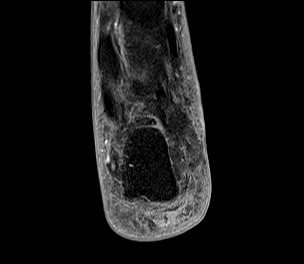
[im 52/52]
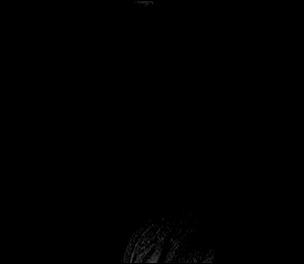

[Series 15: T1 fat-sat post-contrast · coronal · right · 3.0mm · 0.59mm/px · 5 of 52 slices shown]
[im 1/52]
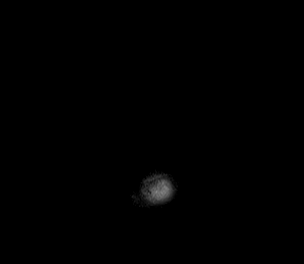
[im 13/52]
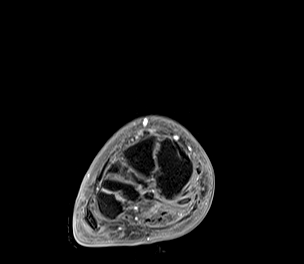
[im 26/52]
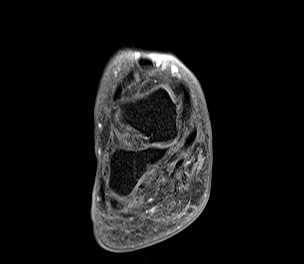
[im 39/52]
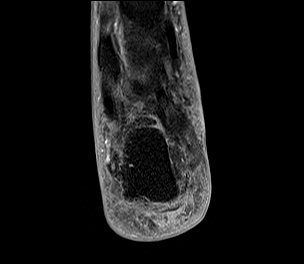
[im 52/52]
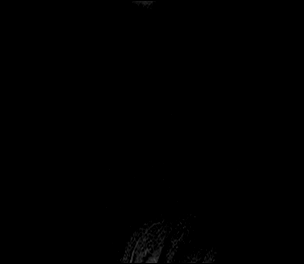

[Series 17: T1 fat-sat · sagittal · right · 3.0mm · 0.59mm/px · 4 of 42 slices shown (2 of 3)]
[im 1/42]
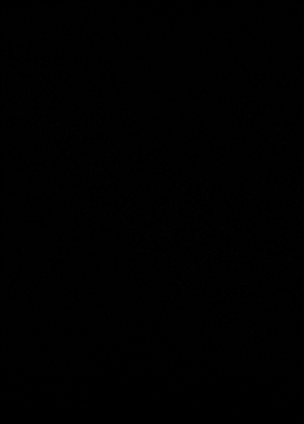
[im 14/42]
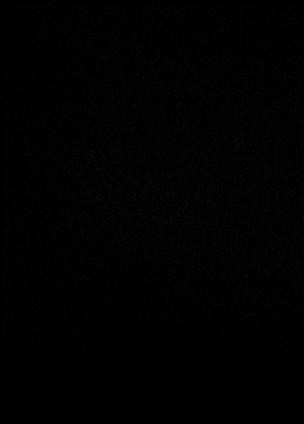
[im 28/42]
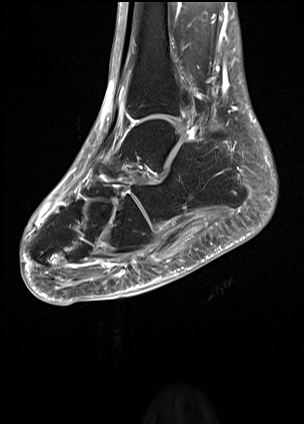
[im 42/42]
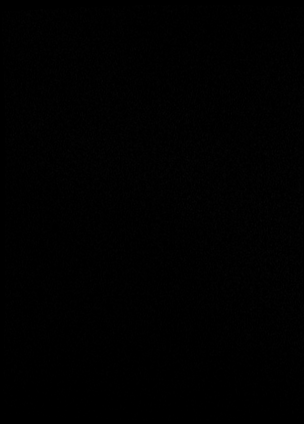

[Series 18: T1 fat-sat · axial · right · 3.0mm · 0.56mm/px · z∈[-71,+48]mm · 3 of 31 slices shown (3 of 3)]
[im 1/31]
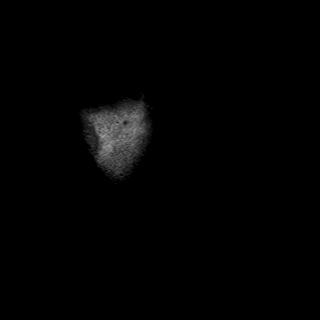
[im 16/31]
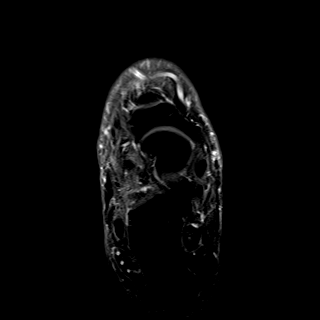
[im 31/31]
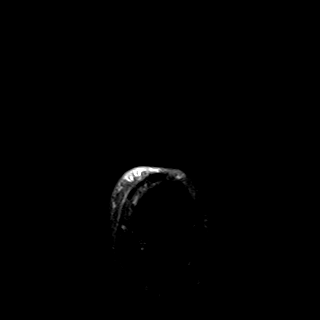

[38 of 40 positions shown; findings below may reference images not displayed]

FINDINGS: Evidence of prior forefoot amputation. This is at the proximal
metatarsal level. I do not see any marrow signal abnormality or
enhancement to suggest osteomyelitis. No findings for septic
arthritis.

No subcutaneous soft tissue swelling/edema/fluid surrounding the
foot suggesting cellulitis but no discrete drainable soft tissue
abscess or findings for pyomyositis.
IMPRESSION: 1. Changes suggesting cellulitis but no discrete drainable soft
tissue abscess.
2. No MR findings for septic arthritis, osteomyelitis or
pyomyositis.

## 2020-01-28 DIAGNOSIS — Z6826 Body mass index (BMI) 26.0-26.9, adult: Secondary | ICD-10-CM | POA: Insufficient documentation
